# Patient Record
Sex: Female | Born: 1965 | Race: White | Hispanic: No | Marital: Single | Smoking: Former smoker
Health system: Southern US, Community
[De-identification: ages and names within clinical notes are randomized; demographics above are authoritative.]

## PROBLEM LIST (undated history)

## (undated) ENCOUNTER — Ambulatory Visit (HOSPITAL_COMMUNITY): Payer: Medicaid Other

## (undated) DIAGNOSIS — I509 Heart failure, unspecified: Secondary | ICD-10-CM

## (undated) DIAGNOSIS — I219 Acute myocardial infarction, unspecified: Secondary | ICD-10-CM

## (undated) DIAGNOSIS — T7840XA Allergy, unspecified, initial encounter: Secondary | ICD-10-CM

## (undated) DIAGNOSIS — F32A Depression, unspecified: Secondary | ICD-10-CM

## (undated) DIAGNOSIS — T8859XA Other complications of anesthesia, initial encounter: Secondary | ICD-10-CM

## (undated) DIAGNOSIS — L509 Urticaria, unspecified: Secondary | ICD-10-CM

## (undated) DIAGNOSIS — K5010 Crohn's disease of large intestine without complications: Secondary | ICD-10-CM

## (undated) DIAGNOSIS — E785 Hyperlipidemia, unspecified: Secondary | ICD-10-CM

## (undated) HISTORY — PX: OVARIAN CYST SURGERY: SHX726

## (undated) HISTORY — PX: CHOLECYSTECTOMY: SHX55

## (undated) HISTORY — PX: TONSILLECTOMY: SUR1361

## (undated) HISTORY — DX: Hyperlipidemia, unspecified: E78.5

## (undated) HISTORY — DX: Acute myocardial infarction, unspecified: I21.9

## (undated) HISTORY — DX: Depression, unspecified: F32.A

## (undated) HISTORY — PX: CARDIAC CATHETERIZATION: SHX172

## (undated) HISTORY — PX: ABDOMINAL HYSTERECTOMY: SHX81

## (undated) HISTORY — PX: NECK SURGERY: SHX720

## (undated) HISTORY — DX: Urticaria, unspecified: L50.9

## (undated) HISTORY — DX: Allergy, unspecified, initial encounter: T78.40XA

---

## 1997-11-08 ENCOUNTER — Other Ambulatory Visit: Admission: RE | Admit: 1997-11-08 | Discharge: 1997-11-08 | Payer: Self-pay | Admitting: *Deleted

## 1998-02-11 ENCOUNTER — Other Ambulatory Visit: Admission: RE | Admit: 1998-02-11 | Discharge: 1998-02-11 | Payer: Self-pay | Admitting: *Deleted

## 1998-05-05 ENCOUNTER — Ambulatory Visit (HOSPITAL_COMMUNITY): Admission: RE | Admit: 1998-05-05 | Discharge: 1998-05-05 | Payer: Self-pay

## 1998-07-31 ENCOUNTER — Emergency Department (HOSPITAL_COMMUNITY): Admission: EM | Admit: 1998-07-31 | Discharge: 1998-07-31 | Payer: Self-pay | Admitting: Emergency Medicine

## 1998-12-14 ENCOUNTER — Other Ambulatory Visit: Admission: RE | Admit: 1998-12-14 | Discharge: 1998-12-14 | Payer: Self-pay | Admitting: Neurology

## 1999-12-14 ENCOUNTER — Other Ambulatory Visit: Admission: RE | Admit: 1999-12-14 | Discharge: 1999-12-14 | Payer: Self-pay | Admitting: *Deleted

## 2000-12-19 ENCOUNTER — Other Ambulatory Visit: Admission: RE | Admit: 2000-12-19 | Discharge: 2000-12-19 | Payer: Self-pay | Admitting: *Deleted

## 2001-12-16 ENCOUNTER — Other Ambulatory Visit: Admission: RE | Admit: 2001-12-16 | Discharge: 2001-12-16 | Payer: Self-pay | Admitting: *Deleted

## 2002-12-22 ENCOUNTER — Other Ambulatory Visit: Admission: RE | Admit: 2002-12-22 | Discharge: 2002-12-22 | Payer: Self-pay | Admitting: *Deleted

## 2004-02-21 ENCOUNTER — Other Ambulatory Visit: Admission: RE | Admit: 2004-02-21 | Discharge: 2004-02-21 | Payer: Self-pay | Admitting: *Deleted

## 2005-01-17 ENCOUNTER — Ambulatory Visit (HOSPITAL_COMMUNITY): Admission: RE | Admit: 2005-01-17 | Discharge: 2005-01-17 | Payer: Self-pay | Admitting: Internal Medicine

## 2005-02-19 ENCOUNTER — Other Ambulatory Visit: Admission: RE | Admit: 2005-02-19 | Discharge: 2005-02-19 | Payer: Self-pay | Admitting: *Deleted

## 2005-05-22 ENCOUNTER — Ambulatory Visit (HOSPITAL_COMMUNITY): Admission: RE | Admit: 2005-05-22 | Discharge: 2005-05-22 | Payer: Self-pay | Admitting: Orthopaedic Surgery

## 2005-05-28 ENCOUNTER — Encounter (INDEPENDENT_AMBULATORY_CARE_PROVIDER_SITE_OTHER): Payer: Self-pay | Admitting: Specialist

## 2005-05-28 ENCOUNTER — Ambulatory Visit (HOSPITAL_COMMUNITY): Admission: RE | Admit: 2005-05-28 | Discharge: 2005-05-29 | Payer: Self-pay | Admitting: Orthopaedic Surgery

## 2006-03-04 ENCOUNTER — Other Ambulatory Visit: Admission: RE | Admit: 2006-03-04 | Discharge: 2006-03-04 | Payer: Self-pay | Admitting: *Deleted

## 2007-04-22 ENCOUNTER — Other Ambulatory Visit: Admission: RE | Admit: 2007-04-22 | Discharge: 2007-04-22 | Payer: Self-pay | Admitting: *Deleted

## 2007-10-01 ENCOUNTER — Emergency Department (HOSPITAL_COMMUNITY): Admission: EM | Admit: 2007-10-01 | Discharge: 2007-10-02 | Payer: Self-pay | Admitting: Emergency Medicine

## 2008-01-06 ENCOUNTER — Ambulatory Visit (HOSPITAL_COMMUNITY): Admission: RE | Admit: 2008-01-06 | Discharge: 2008-01-06 | Payer: Self-pay | Admitting: General Surgery

## 2008-01-06 ENCOUNTER — Encounter (INDEPENDENT_AMBULATORY_CARE_PROVIDER_SITE_OTHER): Payer: Self-pay | Admitting: General Surgery

## 2008-04-01 ENCOUNTER — Encounter (INDEPENDENT_AMBULATORY_CARE_PROVIDER_SITE_OTHER): Payer: Self-pay | Admitting: Obstetrics and Gynecology

## 2008-04-01 ENCOUNTER — Ambulatory Visit (HOSPITAL_COMMUNITY): Admission: RE | Admit: 2008-04-01 | Discharge: 2008-04-02 | Payer: Self-pay | Admitting: Obstetrics and Gynecology

## 2008-05-24 ENCOUNTER — Encounter: Admission: RE | Admit: 2008-05-24 | Discharge: 2008-05-24 | Payer: Self-pay | Admitting: Obstetrics and Gynecology

## 2010-05-07 ENCOUNTER — Encounter: Payer: Self-pay | Admitting: Obstetrics and Gynecology

## 2010-05-07 ENCOUNTER — Encounter: Payer: Self-pay | Admitting: Orthopaedic Surgery

## 2010-08-29 NOTE — H&P (Signed)
[name]  Christine, [name]             ACCOUNT NO.:  [id number]   MEDICAL RECORD NO.:  [id number]          PATIENT TYPE:  AMB   LOCATION:  SDC                           FACILITY:  [facility]   PHYSICIAN:  Ralene Bathe. Matthew Saras, M.D.DATE OF BIRTH:  02/21/1966   DATE OF ADMISSION:  DATE OF DISCHARGE:                              HISTORY & PHYSICAL   CHIEF COMPLAINT:  Abnormal uterine bleeding.   HISTORY OF PRESENT ILLNESS:  A 45 year old G2, P0 a former patient of  Dr. Warnell Forester.  He had performed 3-4 laparoscopies for ovarian cyst over the  years and additionally had NovaSure AMA in 2008, but has continued to  have problematic bleeding.  Currently, on continuous Ortho-Novum 1/35.  She is a nonsmoker, but due to the continued bleeding she requests  definitive hysterectomy.  We had discussed TLH in detail.  Risks related  to be of bleeding, infection, transfusion, adjacent organ injury, the  possible need for open additional surgery, all reviewed with her, which  she understands and accepts.  Also, other risks related to phlebitis and  her expected recovery time discussed.   PAST MEDICAL HISTORY:   ALLERGIES:  LEVAQUIN, SULFA, ERYTHROMYCIN, ULTRAM, ADVIL NAPROSYN, SOME  ANTIDEPRESSANTS.   CURRENT MEDICATIONS:  Effexor, trazodone, Ambien, Ortho-Novum 1/35.   SURGICAL HISTORY:  Tonsillectomy in 1985, hernia repair in 1993.  She  has had 4-5 laparoscopic exams for ovarian cyst.  Uterine ablation in  2008, cholecystectomy in 2009, and neck surgery in 2008.   FAMILY HISTORY:  Significant for father with heart disease and diabetes.   SOCIAL HISTORY:  She does smoke 1 PPD, no alcohol use.   PHYSICAL EXAMINATION:  VITAL SIGNS:  Temp 98.2, blood pressure 120/78.  HEENT:  Unremarkable.  NECK:  Supple without masses.  LUNGS:  Clear.  CARDIOVASCULAR:  Regular rate and rhythm without murmurs, rubs, or  gallops.  BREASTS:  Without masses.  ABDOMEN:  Soft, flat, nontender.  PELVIC:  Normal external  genitalia.  Vagina and cervix clear.  Uterus  mid positional in size.  Adnexa negative.  EXTREMITIES AND NEUROLOGIC:  Unremarkable.   IMPRESSION:  Abnormal bleeding status post NovaSure EMA.   PLAN:  TLH procedure and risks reviewed as above.      Richard M. Matthew Saras, M.D.  Electronically Signed     RMH/MEDQ  D:  03/29/2008  T:  03/30/2008  Job:  [id number]

## 2010-08-29 NOTE — Discharge Summary (Signed)
NAMEMarland Kitchen  [name], Christine             ACCOUNT NO.:  [id number]   MEDICAL RECORD NO.:  [id number]          PATIENT TYPE:  OIB   LOCATION:  9311                          FACILITY:  [facility]   PHYSICIAN:  Ralene Bathe. Matthew Saras, M.D.DATE OF BIRTH:  February 05, 1966   DATE OF ADMISSION:  04/01/2008  DATE OF DISCHARGE:                               DISCHARGE SUMMARY   DISCHARGE DIAGNOSES:  1. 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  2. Total laparoscopic hysterectomy this admission.   Summary of the history and physical exam,  please see admission H and P  for details.  Briefly a 45 year old G57, P0 who has had a prior  endometrial ablation by Dr. Serafina Royals, continues to have problematic  bleeding, presents now for definitive TLH.   HOSPITAL COURSE:  On 04/01/2008 under general anesthesia, the patient  underwent TLH with 100 mL EBL.  The following  a.m., her catheter was  removed.  Diet was advance.  She was afebrile.  Her abdominal exam was  unremarkable.  She was ready for discharge at that point.   LABORATORY DATA:  CMET on admission normal except for glucose 107, O2  73, PT 138.  CBC preop, hemoglobin 14.0 and platelet count 289,000.  Blood type is A positive.  Antibody screen negative.  UPT negative.  Postop CBC on 04/02/2008 WBCs 7.3, hemoglobin 11.8, and hematocrit 34.1.   DISPOSITION:  The patient was discharged on Tylox p.r.n.,  and will  return to our office  in 7-10 days.  Advised to report any incisional  redness or drainage, increased pain or bleeding or fever of 101.  She  was given specific instructions regarding diet, sex, and exercise.   CONDITION:  Good.   ACTIVITY:  Good.      Richard M. Matthew Saras, M.D.  Electronically Signed     RMH/MEDQ  D:  04/02/2008  T:  04/02/2008  Job:  [id number]

## 2010-08-29 NOTE — Op Note (Signed)
Christine [name], Christine [name]             ACCOUNT NO.:  [id number]   MEDICAL RECORD NO.:  [id number]          PATIENT TYPE:  AMB   LOCATION:  DAY                          FACILITY:  [facility]   PHYSICIAN:  Edsel Petrin. Dalbert Batman, M.D.DATE OF BIRTH:  06-28-1965   DATE OF PROCEDURE:  01/06/2008  DATE OF DISCHARGE:                               OPERATIVE REPORT   PREOPERATIVE DIAGNOSIS:  Chronic cholecystitis with cholelithiasis.   POSTOPERATIVE DIAGNOSIS:  Chronic cholecystitis with cholelithiasis.   OPERATION PERFORMED:  Laparoscopic cholecystectomy with intraoperative  cholangiogram.   SURGEON:  Edsel Petrin. Dalbert Batman, M.D.   FIRST ASSISTANT:  Odis Hollingshead, M.D.   OPERATIVE INDICATIONS:  This is a 45 year old white female who has a 3-  month history of intermittent episodes of epigastric and right upper  quadrant pain, radiating to the back with associated nausea and  occasional vomiting.  She had a gallbladder ultrasound which showed  gallstones, but was otherwise unremarkable.  She had been evaluated as  an outpatient and scheduled for elective cholecystectomy.  She continues  to have mild attacks every 2 or 3 days.  Liver function tests are  normal.   OPERATIVE TECHNIQUE:  Following the induction of general endotracheal  anesthesia, the patient's abdomen was prepped and draped in a sterile  fashion.  Intravenous antibiotics were given.  The patient was  identified as the correct patient and correct procedure.  Marcaine 0.5%  with epinephrine was used as a local infiltration anesthetic.  A  transverse incision was made at the lower rim of the umbilicus, through  a previous laparoscopy scar.  The fascia was incised in the midline and  the abdominal cavity entered under direct vision.  A 10-mm Hassan trocar  was inserted and secured with a pursestring suture of 0 Vicryl.  Pneumoperitoneum was created.  The video cam was inserted, with  visualization and findings as described above.  An 10-mm  trocar was  placed in the subxiphoid region, and two 5-mm trocars were placed in the  right upper quadrant.  The gallbladder fundus was identified and  elevated.  There were a few adhesions in the lower gallbladder, but not  much.  The infundibulum was retracted laterally.  We dissected the  peritoneum off of the lower aspect of the gallbladder.  We dissected out  the cystic duct and the cystic artery.  We created a nice window behind  the cystic duct.  A cholangiogram catheter was inserted into the cystic  duct.  Cholangiogram was obtained with the C-arm.   The cholangiogram showed normal intrahepatic and extrahepatic bile  ducts, no filling defect, and no obstruction with good flow of contrast  into the duodenum.  The cholangiogram catheter was removed.  The cystic  duct was secured with 3 metal clips and divided.  I dissected out 2  small branches of the cystic artery, one going anteriorly and one going  posteriorly.  These were isolated separately as they went onto the wall  of the gallbladder, and were then clipped and divided.  Gallbladder was  dissected from its bed with electrocautery, placed in a specimen bag  and  removed.  The operative field was copiously irrigated with saline.  A  few small bleeders in the bed of the gallbladder were controlled with  electrocautery.  At the completion of the case there was no bleeding and  no bile leak whatsoever.   We looked around and thought that the liver, stomach, duodenum, small  intestine and large intestine were grossly normal to inspection.  No  other abnormalities were seen.   The pneumoperitoneum was released.  The trocars were removed.  There was  no bleeding from the trocar sites.  The fascia at the umbilicus was  closed with 0 Vicryl sutures, and the skin closed with subcuticular  sutures of 4-0 Monocryl and Dermabond.  Clean bandages were placed.  The  patient was taken to the recovery room in stable condition.   ESTIMATED  BLOOD LOSS:  About 10 mL.   COMPLICATIONS:  None.   Sponge, needle and instrument counts were correct.      Edsel Petrin. Dalbert Batman, M.D.  Electronically Signed     HMI/MEDQ  D:  01/06/2008  T:  01/06/2008  Job:  [id number]   cc:   Darcus Austin, D.O.  Fax: Chambers. Irven Baltimore, M.D.  Fax: [phone]   Chucky May, M.D.  Fax: [phone]

## 2010-08-29 NOTE — Op Note (Signed)
NAMEMarland Kitchen  RAIA, AMICO             ACCOUNT NO.:  [id number]   MEDICAL RECORD NO.:  [id number]          PATIENT TYPE:  OIB   LOCATION:  9311                          FACILITY:  [facility]   PHYSICIAN:  Ralene Bathe. Matthew Saras, M.D.DATE OF BIRTH:  04-11-1966   DATE OF PROCEDURE:  DATE OF DISCHARGE:                               OPERATIVE REPORT   PREOPERATIVE DIAGNOSES:  Persistent menorrhagia, possible adenomyosis.   POSTOPERATIVE DIAGNOSES:  Persistent menorrhagia, possible adenomyosis.   PROCEDURE:  TLH.   SURGEON:  Ralene Bathe. Matthew Saras, MD   ASSISTANT:  Darlyn Chamber, MD   ANESTHESIA:  General endotracheal.   COMPLICATIONS:  None.   DRAINS:  Foley catheter.   BLOOD LOSS:  100 mL.   SPECIMENS:  Uterus sent to pathology.   PROCEDURE AND FINDINGS:  The patient was taken to the  preoperative room  after an adequate level of general endotracheal anesthesia was obtained,  the patient's legs in stirrups.  The abdomen, perineum, and vagina were  prepped and draped in usual manner for laparoscopy.  The bladder was  drained with the Foley catheter.  The subumbilical area was infiltrated  with 0.25% Marcaine plain.  A small incision was made.  The Veress  needle was introduced without difficulty at its intra-abdominal position  verified by pressure and water testing.  After 2-1/2 liter  pneumoperitoneum was then created, laparoscopic trocar and sleeve were  then introduced without difficulty.  Initial inspection revealed no  evidence of any bleeding or trauma.  This was a 5-mm trocar with a 5-mm  laparoscopic camera.  The patient placed in Trendelenburg.  The left  lateral 10:11 was placed after negative transillumination and local  anesthetic, the same on the right for three-port technique.  She was  placed in Trendelenburg and grasper was used to grasp the fundus.  Inspection revealed both ovaries to be normal.  The anterior cul-de-sac  were free and clear.  No other abnormalities were  noted.  Using the  EnSeal device, the utero-ovarian pedicle on the left was coagulated and  divided down including the round ligament.  The peritoneum was then  carried around anteriorly creating a bladder flap.  Minimal  skeletonization of the ascending branch of the uterine artery, then the  EnSeal was used to coagulate and divide the uterine.  The exact same  repeated on the opposite side conserving both ovaries.  Further  dissection of the bladder with strong upper traction by the assistance  with blunt dissection.  At that point, a vaginal sponge stick was used  to place firm upper pressure on the anterior fornix, this could be  palpated and some additional tissue was minimally, sharply, and bluntly  dissected below to allow the bladder to be well below.  The anterior  vaginal mucosa was then palpated, was back scored with the harmonic Ace  to perform anterior colpotomy with visualization of the sponge stick.  A  sponge stick vaginal was placed in the posterior fornix on tension with  upper traction and a posterior colpotomy was performed similarly with  the harmonic ACE.  Then, using the harmonic  ACE, the cardinal ligament,  uterosacral ligaments, and the remaining cervicovaginal pedicles were  coagulated and divided.  The specimen was removed transvaginally, left  in the vagina to occlude the pneumoperitoneum.  The pelvis was then  irrigated with saline and aspirated.  A 0 Vicryl sutures were then used  with extracorporeal knot trying to reapproximate good tissue bites from  the vaginal mucosa, 6 sutures across the vaginal cuff.  Further  irrigation revealed excellent hemostasis even at reduced pressure.  Interceed was placed across the vaginal cuff, instruments removed.  Gas  allowed to escape.  Defects closed with 4-0 Dexon subcuticular sutures  and Dermabond.  She tolerated this well, went to recovery room in good  condition.      Richard M. Matthew Saras, M.D.  Electronically  Signed     RMH/MEDQ  D:  04/01/2008  T:  04/01/2008  Job:  [id number]

## 2010-09-01 NOTE — Op Note (Signed)
Christine [name], Christine [name]             ACCOUNT NO.:  [id number]   MEDICAL RECORD NO.:  [id number]          PATIENT TYPE:  OIB   LOCATION:  5007                         FACILITY:  [facility]   PHYSICIAN:  Mark C. Lorin Mercy, M.D.    DATE OF BIRTH:  1965/06/16   DATE OF PROCEDURE:  05/28/2005  DATE OF DISCHARGE:                                 OPERATIVE REPORT   PREOPERATIVE DIAGNOSES:  1.  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  2.  Right recurrent dorsal hand ganglion.   POSTOPERATIVE DIAGNOSES:  1.  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  2.  Right recurrent dorsal hand ganglion.   PROCEDURE:  1.  C6-7 anterior cervical diskectomy and fusion with left iliac crest bone      graft.  2.  Right hand dorsal ganglion excision.   SURGEON:  Mark C. Lorin Mercy, M.D.   BRIEF HISTORY:  This 45 year old female has had cervical spondylosis for  several years and told she needed an anterior cervical fusion.  Her neck got  better 6-9 months later with continued mild pain.  She has only had  significant increase in pain with left arm triceps weakness and MRI showing  C6-7 HNP on the left.  She has also had a right dorsal hand ganglion that  she has had aspirated before; it has recurred and it is 2 x 3 cm and  painful.   DESCRIPTION OF PROCEDURE:  After induction of general anesthesia, arm was  tucked to the side with careful __________  pads, 2-pound sandbag behind the  neck, gel bag behind the left buttock.  Head in halter traction, neck and  iliac crest were prepped with  DuraPrep, area scored with towels, sterile  skin marker on the neck, Betadine, Vi-Drape and a sterile mask at the head,  thyroid sheets and drapes.  Iliac crest was scored with towels and had a Vi-  Drape applied as well.  Incision was started in the neck, starting in the  midline and extending to the left, blunt dissection with carotid sheath and  contents lateral.  Esophagus was medial and in the midline, longus colli  were split.  There  was an osteophyte present and C6 disk space was  identified by the carotid tubercle, which was directly underneath the  incision.  A 25 short needle was used for identification.  Cross-table  lateral x-ray was taken, which confirmed the appropriate level.  Disk was  marked by removing the 25-gauge needle and making a box cut with the scalpel  and using a pituitary to remove some of the disk.  After placement of teeth  Cloward retractors, left and right smooth blades up and down, the patient  was then prepared for the Cloward by removing remaining disk with pituitary  and Cloward curettes.  Operating microscope was brought in after Cloward was  used to drill a 12-mm hole back to the posterior cortex.  With the draped  microscope present, the posterior longitudinal ligament was taken down and  bone was removed from 1/2 of the key hole with uncovertebral joints prepared  with stripping right and left.  There was scarring of the posterior  longitudinal ligament over on the left side with a pocket and the posterior  longitudinal ligament was taken with exposure of the dura, but there were no  extruded fragments.  Microdissection and palpation with a black nerve hook  used as a hockey-stick was performed.  No extruded fragments were present.  Bone was removed, enlarging each foramen and a 14-mm plug was then placed  after a depth gauge measurement, bullet-nosing it and generally sinking it 1  mm.  The neck was rotated.  The 14-mm plug was sturdy.  The iliac crest had  been exposed with a 2-cm skin incision, splitting the deep fascia in line  with the fibers, cleaning the muscle off with a Cobb and then using the 14-  mm Cloward bone harvester.  Sponge was packed in the iliac crest due to some  mild bleeding.  Neck was closed with 3-0 in the platysma and 4-0  subcuticular skin closure, tincture of Benzoin and Steri-Strips.  A Hemovac  was placed prior to closure in line with the skin incision on  the left  lateral aspect of the incision.  Iliac crest was closed with 0 Vicryl in the  fascia, 2-0 in the subcutaneous tissue, 4-0 Vicryl subcuticular skin  closure, tincture of Benzoin, Steri-Strips and postop dressing.  A soft  cervical collar was applied after dressing on the neck.   Arm was taken out of the tucked position and placed on a hand table, prepped  up to the elbow with a proximal arm tourniquet.  Extremity sheet and drape  were applied.  The patient was then prepared with an extremity sheet,  sterile skin marker and tourniquet inflation.  A transverse incision was  made with blunt dissection down to the ganglion that was present and it was  coming off from the dorsal wrist, coming underneath the index extensors and  was a very large ganglion.  The stalk was cut off and then the stalk was  followed down to the wrist joint, where it was opened and a piece of the  dorsal capsule was excised where the ganglion neck began.  After irrigation  with saline solution, tourniquet was deflated with total tourniquet time of  5 minutes.  Hemostasis was obtained.  With using the Bovie on one of the  bleeders at the capsule, the skin edge proximally touched the Bovie and a 1-  mm ellipse of skin was taken back to normal skin and then reapproximated  with 3-0 Vicryl and 4-0 Vicryl subcuticular skin closure.  Tincture of  Benzoin, Steri-Strips, 4 x 4 and Coban were applied, and the patient was  transferred to Recovery in stable condition.  Instrument count and needle  count were correct.      Mark C. Lorin Mercy, M.D.  Electronically Signed     MCY/MEDQ  D:  05/28/2005  T:  05/29/2005  Job:  [id number]

## 2011-01-11 LAB — URINALYSIS, ROUTINE W REFLEX MICROSCOPIC
Bilirubin Urine: NEGATIVE
Glucose, UA: NEGATIVE
Hgb urine dipstick: NEGATIVE
Ketones, ur: NEGATIVE
Nitrite: NEGATIVE
Protein, ur: NEGATIVE
Specific Gravity, Urine: 1.018
Urobilinogen, UA: 0.2
pH: 5.5

## 2011-01-11 LAB — DIFFERENTIAL
Basophils Absolute: 0.1
Basophils Relative: 1
Eosinophils Absolute: 0.2
Eosinophils Relative: 2
Lymphocytes Relative: 28
Lymphs Abs: 2.8
Monocytes Absolute: 0.6
Monocytes Relative: 6
Neutro Abs: 6.4
Neutrophils Relative %: 64

## 2011-01-11 LAB — COMPREHENSIVE METABOLIC PANEL
ALT: 17
AST: 17
Albumin: 3.5
Alkaline Phosphatase: 55
BUN: 5 — ABNORMAL LOW
CO2: 27
Calcium: 8.5
Chloride: 103
Creatinine, Ser: 0.72
GFR calc Af Amer: >60
GFR calc non Af Amer: >60
Glucose, Bld: 95
Potassium: 3.2 — ABNORMAL LOW
Sodium: 136
Total Bilirubin: 0.6
Total Protein: 5.8 — ABNORMAL LOW

## 2011-01-11 LAB — CBC
HCT: 40.8
Hemoglobin: 13.7
MCHC: 33.6
MCV: 92.5
Platelets: 320
RBC: 4.41
RDW: 12.7
WBC: 10

## 2011-01-11 LAB — URINE MICROSCOPIC-ADD ON

## 2011-01-11 LAB — POCT PREGNANCY, URINE
Operator id: 29011
Preg Test, Ur: NEGATIVE

## 2011-01-11 LAB — LIPASE, BLOOD: Lipase: 29

## 2011-01-15 LAB — COMPREHENSIVE METABOLIC PANEL
ALT: 20
AST: 19
Albumin: 3.8
Alkaline Phosphatase: 66
BUN: 9
CO2: 22
Calcium: 8.7
Chloride: 109
Creatinine, Ser: 0.95
GFR calc Af Amer: >60
GFR calc non Af Amer: >60
Glucose, Bld: 107 — ABNORMAL HIGH
Potassium: 3.2 — ABNORMAL LOW
Sodium: 139
Total Bilirubin: 0.5
Total Protein: 6.4

## 2011-01-15 LAB — CBC
HCT: 42
Hemoglobin: 13.9
MCHC: 33.1
MCV: 93.8
Platelets: 319
RBC: 4.48
RDW: 13
WBC: 9.2

## 2011-01-15 LAB — DIFFERENTIAL
Basophils Absolute: 0
Basophils Relative: 1
Eosinophils Absolute: 0.2
Eosinophils Relative: 3
Lymphocytes Relative: 17
Lymphs Abs: 1.6
Monocytes Absolute: 0.5
Monocytes Relative: 5
Neutro Abs: 6.8
Neutrophils Relative %: 74

## 2011-01-15 LAB — URINALYSIS, ROUTINE W REFLEX MICROSCOPIC
Bilirubin Urine: NEGATIVE
Glucose, UA: NEGATIVE
Hgb urine dipstick: NEGATIVE
Ketones, ur: NEGATIVE
Nitrite: NEGATIVE
Protein, ur: NEGATIVE
Specific Gravity, Urine: 1.027
Urobilinogen, UA: 0.2
pH: 6

## 2011-01-15 LAB — PREGNANCY, URINE: Preg Test, Ur: NEGATIVE

## 2011-01-19 LAB — CBC
HCT: 34.1 % — ABNORMAL LOW (ref 36.0–46.0)
HCT: 41.2 % (ref 36.0–46.0)
Hemoglobin: 14 g/dL (ref 12.0–15.0)
MCHC: 34 g/dL (ref 30.0–36.0)
MCV: 93.4 fL (ref 78.0–100.0)
Platelets: 229 10*3/uL (ref 150–400)
Platelets: 289 10*3/uL (ref 150–400)
RBC: 4.42 MIL/uL (ref 3.87–5.11)
RDW: 12.5 % (ref 11.5–15.5)
WBC: 7.3 10*3/uL (ref 4.0–10.5)
WBC: 9.1 10*3/uL (ref 4.0–10.5)

## 2011-01-19 LAB — COMPREHENSIVE METABOLIC PANEL
ALT: 138 U/L — ABNORMAL HIGH (ref 0–35)
AST: 73 U/L — ABNORMAL HIGH (ref 0–37)
Albumin: 3.6 g/dL (ref 3.5–5.2)
Alkaline Phosphatase: 76 U/L (ref 39–117)
BUN: 7 mg/dL (ref 6–23)
CO2: 26 mEq/L (ref 19–32)
Calcium: 8.5 mg/dL (ref 8.4–10.5)
Chloride: 106 mEq/L (ref 96–112)
Creatinine, Ser: 0.81 mg/dL (ref 0.4–1.2)
GFR calc Af Amer: >60 mL/min (ref >60)
GFR calc non Af Amer: >60 mL/min (ref >60)
Glucose, Bld: 107 mg/dL — ABNORMAL HIGH (ref 70–99)
Potassium: 3.5 mEq/L (ref 3.5–5.1)
Sodium: 137 mEq/L (ref 135–145)
Total Bilirubin: 0.4 mg/dL (ref 0.3–1.2)
Total Protein: 6 g/dL (ref 6.0–8.3)

## 2011-01-19 LAB — TYPE AND SCREEN
ABO/RH(D): A POS
Antibody Screen: NEGATIVE

## 2011-01-19 LAB — ABO/RH: ABO/RH(D): A POS

## 2011-01-19 LAB — HCG, SERUM, QUALITATIVE: Preg, Serum: NEGATIVE

## 2019-09-10 ENCOUNTER — Encounter: Payer: Self-pay | Admitting: Internal Medicine

## 2019-09-10 NOTE — Telephone Encounter (Signed)
-   call received during Epic downtime  Patient called to advise that she has put on a new DEXCOM patch and that the blood sugar readings from there ar 186 and that she used a finger stick/meter and her blood sugar was 126.  Wants to know why there would be such a large discrepancy in readings?  Also wanted to advise that blood sugars seem ti be under less control since changing from Victoza to Trulicity.    Patient requested a call back to [phone]

## 2019-09-11 NOTE — Telephone Encounter (Signed)
Please advise 

## 2019-09-11 NOTE — Telephone Encounter (Signed)
ERRONEOUS ENCOUNTER

## 2019-09-11 NOTE — Telephone Encounter (Signed)
Please also advise on victoza/trulicity?

## 2020-04-02 ENCOUNTER — Ambulatory Visit: Payer: Self-pay | Attending: Internal Medicine

## 2020-04-02 DIAGNOSIS — Z23 Encounter for immunization: Secondary | ICD-10-CM

## 2020-04-02 NOTE — Progress Notes (Signed)
   Covid-19 Vaccination Clinic  Name:  Christine [name]    MRN: [id number] DOB: 1965/06/06  04/02/2020  [name] was observed post Covid-19 immunization for 15 minutes without incident. She was provided with Vaccine Information Sheet and instruction to access the V-Safe system.   [name] was instructed to call 911 with any severe reactions post vaccine: Marland Kitchen Difficulty breathing  . Swelling of face and throat  . A fast heartbeat  . A bad rash all over body  . Dizziness and weakness   Immunizations Administered    Name Date Dose VIS Date Route   Pfizer COVID-19 Vaccine 04/02/2020 10:07 AM 0.3 mL 02/03/2020 Intramuscular   Manufacturer: [place]   Lot: [id number]   [place]: [id number]

## 2020-09-01 ENCOUNTER — Ambulatory Visit (HOSPITAL_COMMUNITY)
Admission: EM | Admit: 2020-09-01 | Discharge: 2020-09-01 | Disposition: A | Discharge status: Home / Self Care | Payer: 59

## 2020-09-01 ENCOUNTER — Encounter (HOSPITAL_COMMUNITY): Payer: Self-pay | Admitting: Emergency Medicine

## 2020-09-01 ENCOUNTER — Other Ambulatory Visit: Payer: Self-pay

## 2020-09-01 DIAGNOSIS — M62838 Other muscle spasm: Secondary | ICD-10-CM | POA: Diagnosis not present

## 2020-09-01 LAB — POCT URINALYSIS DIPSTICK, ED / UC
Bilirubin Urine: NEGATIVE
Glucose, UA: NEGATIVE mg/dL
Hgb urine dipstick: NEGATIVE
Ketones, ur: NEGATIVE mg/dL
Leukocytes,Ua: NEGATIVE
Nitrite: NEGATIVE
Protein, ur: NEGATIVE mg/dL
Specific Gravity, Urine: 1.015 (ref 1.005–1.030)
Urobilinogen, UA: 0.2 mg/dL (ref 0.0–1.0)
pH: 6 (ref 5.0–8.0)

## 2020-09-01 MED ORDER — TIZANIDINE HCL 4 MG PO TABS: 4.0000 mg | ORAL_TABLET | Freq: Four times a day (QID) | ORAL | 0 refills | Status: DC | PRN

## 2020-09-01 NOTE — Discharge Instructions (Signed)
Take the Zanaflex as needed for muscle pain and spasms.  You can continue to take Tylenol as needed for pain.   Use heat, ice, or alternate heat and ice for comfort.  Rest as much as possible for the next few days.   If your symptoms worsen, go to the emergency department for further evaluation.

## 2020-09-01 NOTE — ED Triage Notes (Addendum)
Fullness in epigastric area started Saturday after a full meal.  Also had soreness in upper back.  On Monday, started having left side pain, uncomfortable feeling.  Yesterday, when bending forward, left side pain gets worse and is moving further down side.  Urinating more frequently, but no other urinary symptoms.  Deep breathing makes pain in left side worse.  Random chills

## 2020-09-01 NOTE — ED Provider Notes (Signed)
[place]    CSN: [id number] Arrival date & time: 09/01/20  1409      History   Chief Complaint No chief complaint on file.   HPI Christine [name] is a 55 y.o. female.   Patient here for evaluation of left side flank and back pain that has been ongoing for the past several days.  Reports urinary frequency but denies any urgency or dysuria.  Has been taking Tylenol with minimal relief of symptoms.  Reports pain worse with movement and when taking a deep breath.  Denies any trauma, injury, or other precipitating event.  Denies any specific alleviating or aggravating factors.  Denies any fevers, chest pain, shortness of breath, N/V/D, numbness, tingling, weakness, abdominal pain, or headaches.   ROS: As per HPI, all other pertinent ROS negative   The history is provided by the patient.    History reviewed. No pertinent past medical history.  There are no problems to display for this patient.   Past Surgical History:  Procedure Laterality Date  . ABDOMINAL HYSTERECTOMY    . CHOLECYSTECTOMY    . NECK SURGERY    . OVARIAN CYST SURGERY      OB History   No obstetric history on file.      Home Medications    Prior to Admission medications   Medication Sig Start Date End Date Taking? Authorizing Provider  ALPRAZolam (XANAX) 1 MG tablet TAKE 1-2 TABLETS BY MOUTH 4 TIMES DAILY. 12/10/17  Yes [provider]  tiZANidine (ZANAFLEX) 4 MG tablet Take 1 tablet (4 mg total) by mouth every 6 (six) hours as needed for muscle spasms. 09/01/20  Yes Pearson Forster, NP  amphetamine-dextroamphetamine (ADDERALL) 30 MG tablet Take 1 tablet by mouth 2 (two) times daily. 07/29/20   [provider]  cyclobenzaprine (FLEXERIL) 10 MG tablet Take 2 tablets by mouth at bedtime. 08/05/20   [provider]  venlafaxine (EFFEXOR) 75 MG tablet Take by mouth.    [provider]  zolpidem (AMBIEN) 5 MG tablet Take by mouth.    [provider]     Family History History reviewed. No pertinent family history.  Social History Social History   Tobacco Use  . Smoking status: Current Every Day Smoker  Vaping Use  . Vaping Use: Never used  Substance Use Topics  . Alcohol use: Never  . Drug use: Never     Allergies   Sulfa antibiotics, Erythromycin, Sulfamethoxazole-trimethoprim, and Tramadol   Review of Systems Review of Systems  Genitourinary: Positive for flank pain and frequency. Negative for hematuria and urgency.  Musculoskeletal: Positive for back pain.  All other systems reviewed and are negative.    Physical Exam Triage Vital Signs ED Triage Vitals  Enc Vitals Group     BP 09/01/20 1530 111/77     Pulse Rate 09/01/20 1530 (!) 101     Resp 09/01/20 1530 20     Temp 09/01/20 1530 98.1 F (36.7 C)     Temp Source 09/01/20 1530 Oral     SpO2 09/01/20 1530 97 %     Weight --      Height --      Head Circumference --      Peak Flow --      Pain Score 09/01/20 1525 6     Pain Loc --      Pain Edu? --      Excl. in [place]? --    No data found.  Updated Vital  Signs BP 111/77 (BP Location: Left Arm)   Pulse (!) 101   Temp 98.1 F (36.7 C) (Oral)   Resp 20   SpO2 97%   Visual Acuity Right Eye Distance:   Left Eye Distance:   Bilateral Distance:    Right Eye Near:   Left Eye Near:    Bilateral Near:     Physical Exam Vitals and nursing note reviewed.  Constitutional:      General: She is not in acute distress.    Appearance: Normal appearance. She is not ill-appearing, toxic-appearing or diaphoretic.  HENT:     Head: Normocephalic and atraumatic.  Eyes:     Conjunctiva/sclera: Conjunctivae normal.  Cardiovascular:     Rate and Rhythm: Normal rate.     Pulses: Normal pulses.  Pulmonary:     Effort: Pulmonary effort is normal.  Abdominal:     General: Abdomen is flat.     Palpations: Abdomen is soft.     Tenderness: There is no abdominal tenderness. There is no right CVA tenderness,  left CVA tenderness, guarding or rebound. Negative signs include Murphy's sign, Rovsing's sign, McBurney's sign, psoas sign and obturator sign.  Musculoskeletal:        General: Normal range of motion.     Cervical back: Normal range of motion.     Lumbar back: Spasms and tenderness (left side and flank tenderness) present. No bony tenderness. Normal range of motion. Negative right straight leg raise test and negative left straight leg raise test.  Skin:    General: Skin is warm and dry.  Neurological:     General: No focal deficit present.     Mental Status: She is alert and oriented to person, place, and time.  Psychiatric:        Mood and Affect: Mood normal.      UC Treatments / Results  Labs (all labs ordered are listed, but only abnormal results are displayed) Labs Reviewed  POCT URINALYSIS DIPSTICK, ED / UC    EKG   Radiology No results found.  Procedures Procedures (including critical care time)  Medications Ordered in UC Medications - No data to display  Initial Impression / Assessment and Plan / UC Course  I have reviewed the triage vital signs and the nursing notes.  Pertinent labs & imaging results that were available during my care of the patient were reviewed by me and considered in my medical decision making (see chart for details).    Assessment negative for red flags or concerns.  Urinalysis negative, no signs of infection.  This is likely muscle spasms or muscle strain.  Prescribed Zanaflex as needed for muscle pain and spasms.  May continue to take Tylenol as needed for pain.  Encouraged heat, ice or alternating heat and ice.  Encourage patient to follow-up if symptoms worsen or do not improve in the next few days. Final Clinical Impressions(s) / UC Diagnoses   Final diagnoses:  Muscle spasm     Discharge Instructions     Take the Zanaflex as needed for muscle pain and spasms.  You can continue to take Tylenol as needed for pain.   Use heat,  ice, or alternate heat and ice for comfort.  Rest as much as possible for the next few days.   If your symptoms worsen, go to the emergency department for further evaluation.        ED Prescriptions    Medication Sig Dispense Auth. Provider   tiZANidine (ZANAFLEX) 4 MG  tablet Take 1 tablet (4 mg total) by mouth every 6 (six) hours as needed for muscle spasms. 30 tablet Pearson Forster, NP     PDMP not reviewed this encounter.   Pearson Forster, NP 09/01/20 [phone]

## 2021-01-20 ENCOUNTER — Emergency Department (HOSPITAL_BASED_OUTPATIENT_CLINIC_OR_DEPARTMENT_OTHER): Payer: Medicaid Other | Admitting: Radiology

## 2021-01-20 ENCOUNTER — Emergency Department (HOSPITAL_BASED_OUTPATIENT_CLINIC_OR_DEPARTMENT_OTHER): Payer: Medicaid Other

## 2021-01-20 ENCOUNTER — Inpatient Hospital Stay (HOSPITAL_BASED_OUTPATIENT_CLINIC_OR_DEPARTMENT_OTHER)
Admission: EM | Admit: 2021-01-20 | Discharge: 2021-01-25 | DRG: 287 | Disposition: A | Discharge status: Home / Self Care | Payer: Medicaid Other | Attending: Internal Medicine | Admitting: Internal Medicine

## 2021-01-20 ENCOUNTER — Encounter (HOSPITAL_BASED_OUTPATIENT_CLINIC_OR_DEPARTMENT_OTHER): Payer: Self-pay | Admitting: *Deleted

## 2021-01-20 ENCOUNTER — Other Ambulatory Visit: Payer: Self-pay

## 2021-01-20 DIAGNOSIS — Z79899 Other long term (current) drug therapy: Secondary | ICD-10-CM

## 2021-01-20 DIAGNOSIS — F17210 Nicotine dependence, cigarettes, uncomplicated: Secondary | ICD-10-CM | POA: Diagnosis present

## 2021-01-20 DIAGNOSIS — D72829 Elevated white blood cell count, unspecified: Secondary | ICD-10-CM | POA: Diagnosis not present

## 2021-01-20 DIAGNOSIS — I2510 Atherosclerotic heart disease of native coronary artery without angina pectoris: Secondary | ICD-10-CM | POA: Diagnosis present

## 2021-01-20 DIAGNOSIS — Z20822 Contact with and (suspected) exposure to covid-19: Secondary | ICD-10-CM | POA: Diagnosis present

## 2021-01-20 DIAGNOSIS — F419 Anxiety disorder, unspecified: Secondary | ICD-10-CM

## 2021-01-20 DIAGNOSIS — I255 Ischemic cardiomyopathy: Secondary | ICD-10-CM | POA: Diagnosis present

## 2021-01-20 DIAGNOSIS — M546 Pain in thoracic spine: Secondary | ICD-10-CM | POA: Diagnosis not present

## 2021-01-20 DIAGNOSIS — Z882 Allergy status to sulfonamides status: Secondary | ICD-10-CM

## 2021-01-20 DIAGNOSIS — Z597 Insufficient social insurance and welfare support: Secondary | ICD-10-CM

## 2021-01-20 DIAGNOSIS — J9811 Atelectasis: Secondary | ICD-10-CM | POA: Diagnosis not present

## 2021-01-20 DIAGNOSIS — I5021 Acute systolic (congestive) heart failure: Principal | ICD-10-CM | POA: Diagnosis present

## 2021-01-20 DIAGNOSIS — Z823 Family history of stroke: Secondary | ICD-10-CM

## 2021-01-20 DIAGNOSIS — Z9049 Acquired absence of other specified parts of digestive tract: Secondary | ICD-10-CM | POA: Diagnosis not present

## 2021-01-20 DIAGNOSIS — R339 Retention of urine, unspecified: Secondary | ICD-10-CM | POA: Diagnosis not present

## 2021-01-20 DIAGNOSIS — I2582 Chronic total occlusion of coronary artery: Secondary | ICD-10-CM | POA: Diagnosis not present

## 2021-01-20 DIAGNOSIS — I5020 Unspecified systolic (congestive) heart failure: Secondary | ICD-10-CM

## 2021-01-20 DIAGNOSIS — I959 Hypotension, unspecified: Secondary | ICD-10-CM | POA: Diagnosis not present

## 2021-01-20 DIAGNOSIS — Z720 Tobacco use: Secondary | ICD-10-CM | POA: Diagnosis not present

## 2021-01-20 DIAGNOSIS — R918 Other nonspecific abnormal finding of lung field: Secondary | ICD-10-CM | POA: Diagnosis not present

## 2021-01-20 DIAGNOSIS — Z8249 Family history of ischemic heart disease and other diseases of the circulatory system: Secondary | ICD-10-CM

## 2021-01-20 DIAGNOSIS — I340 Nonrheumatic mitral (valve) insufficiency: Secondary | ICD-10-CM | POA: Diagnosis not present

## 2021-01-20 DIAGNOSIS — F32A Depression, unspecified: Secondary | ICD-10-CM | POA: Diagnosis present

## 2021-01-20 DIAGNOSIS — Z881 Allergy status to other antibiotic agents status: Secondary | ICD-10-CM

## 2021-01-20 DIAGNOSIS — Z8616 Personal history of COVID-19: Secondary | ICD-10-CM

## 2021-01-20 DIAGNOSIS — R0602 Shortness of breath: Secondary | ICD-10-CM | POA: Diagnosis not present

## 2021-01-20 DIAGNOSIS — R0789 Other chest pain: Secondary | ICD-10-CM | POA: Diagnosis not present

## 2021-01-20 DIAGNOSIS — I509 Heart failure, unspecified: Secondary | ICD-10-CM | POA: Diagnosis not present

## 2021-01-20 DIAGNOSIS — E876 Hypokalemia: Secondary | ICD-10-CM | POA: Diagnosis not present

## 2021-01-20 DIAGNOSIS — I253 Aneurysm of heart: Secondary | ICD-10-CM | POA: Diagnosis present

## 2021-01-20 DIAGNOSIS — F909 Attention-deficit hyperactivity disorder, unspecified type: Secondary | ICD-10-CM | POA: Diagnosis present

## 2021-01-20 DIAGNOSIS — E669 Obesity, unspecified: Secondary | ICD-10-CM | POA: Diagnosis present

## 2021-01-20 DIAGNOSIS — Z6831 Body mass index (BMI) 31.0-31.9, adult: Secondary | ICD-10-CM

## 2021-01-20 DIAGNOSIS — I513 Intracardiac thrombosis, not elsewhere classified: Secondary | ICD-10-CM | POA: Diagnosis present

## 2021-01-20 DIAGNOSIS — I252 Old myocardial infarction: Secondary | ICD-10-CM

## 2021-01-20 DIAGNOSIS — J8489 Other specified interstitial pulmonary diseases: Secondary | ICD-10-CM | POA: Diagnosis not present

## 2021-01-20 DIAGNOSIS — Z885 Allergy status to narcotic agent status: Secondary | ICD-10-CM

## 2021-01-20 DIAGNOSIS — I5041 Acute combined systolic (congestive) and diastolic (congestive) heart failure: Secondary | ICD-10-CM | POA: Diagnosis not present

## 2021-01-20 HISTORY — DX: Shortness of breath: R06.02

## 2021-01-20 HISTORY — DX: Tobacco use: Z72.0

## 2021-01-20 HISTORY — DX: Anxiety disorder, unspecified: F41.9

## 2021-01-20 LAB — RESP PANEL BY RT-PCR (FLU A&B, COVID) ARPGX2
Influenza A by PCR: NEGATIVE
Influenza B by PCR: NEGATIVE
SARS Coronavirus 2 by RT PCR: NEGATIVE

## 2021-01-20 LAB — BASIC METABOLIC PANEL
Anion gap: 9 (ref 5–15)
BUN: 13 mg/dL (ref 6–20)
CO2: 21 mmol/L — ABNORMAL LOW (ref 22–32)
Calcium: 9.4 mg/dL (ref 8.9–10.3)
Chloride: 109 mmol/L (ref 98–111)
Creatinine, Ser: 0.64 mg/dL (ref 0.44–1.00)
GFR, Estimated: >60 mL/min (ref >60)
Glucose, Bld: 109 mg/dL — ABNORMAL HIGH (ref 70–99)
Potassium: 3.8 mmol/L (ref 3.5–5.1)
Sodium: 139 mmol/L (ref 135–145)

## 2021-01-20 LAB — CBC WITH DIFFERENTIAL/PLATELET
Abs Immature Granulocytes: 0.02 10*3/uL (ref 0.00–0.07)
Basophils Absolute: 0.1 10*3/uL (ref 0.0–0.1)
Basophils Relative: 1 %
Eosinophils Absolute: 0.1 10*3/uL (ref 0.0–0.5)
Eosinophils Relative: 1 %
HCT: 41.5 % (ref 36.0–46.0)
Hemoglobin: 13.8 g/dL (ref 12.0–15.0)
Immature Granulocytes: 0 %
Lymphocytes Relative: 17 %
Lymphs Abs: 1.9 10*3/uL (ref 0.7–4.0)
MCH: 28.9 pg (ref 26.0–34.0)
MCHC: 33.3 g/dL (ref 30.0–36.0)
MCV: 86.8 fL (ref 80.0–100.0)
Monocytes Absolute: 0.8 10*3/uL (ref 0.1–1.0)
Monocytes Relative: 7 %
Neutro Abs: 8.8 10*3/uL — ABNORMAL HIGH (ref 1.7–7.7)
Neutrophils Relative %: 74 %
Platelets: 349 10*3/uL (ref 150–400)
RBC: 4.78 MIL/uL (ref 3.87–5.11)
RDW: 13.5 % (ref 11.5–15.5)
WBC: 11.7 10*3/uL — ABNORMAL HIGH (ref 4.0–10.5)
nRBC: 0 % (ref 0.0–0.2)

## 2021-01-20 LAB — TROPONIN I (HIGH SENSITIVITY)
Troponin I (High Sensitivity): 16 ng/L (ref <18)
Troponin I (High Sensitivity): 18 ng/L — ABNORMAL HIGH (ref <18)

## 2021-01-20 LAB — BRAIN NATRIURETIC PEPTIDE: B Natriuretic Peptide: 658.1 pg/mL — ABNORMAL HIGH (ref 0.0–100.0)

## 2021-01-20 LAB — T4, FREE: Free T4: 1.02 ng/dL (ref 0.61–1.12)

## 2021-01-20 LAB — TSH: TSH: 1.876 u[IU]/mL (ref 0.350–4.500)

## 2021-01-20 LAB — D-DIMER, QUANTITATIVE: D-Dimer, Quant: 1.26 ug/mL-FEU — ABNORMAL HIGH (ref 0.00–0.50)

## 2021-01-20 MED ORDER — SODIUM CHLORIDE 0.9 % IV BOLUS
500.0000 mL | Freq: Once | INTRAVENOUS | Status: AC
  Administered 2021-01-20: 500 mL via INTRAVENOUS

## 2021-01-20 MED ORDER — BENZONATATE 100 MG PO CAPS: 200.0000 mg | ORAL_CAPSULE | Freq: Three times a day (TID) | ORAL | Status: DC | PRN

## 2021-01-20 MED ORDER — AMPHETAMINE-DEXTROAMPHETAMINE 10 MG PO TABS
30.0000 mg | ORAL_TABLET | Freq: Two times a day (BID) | ORAL | Status: DC
  Administered 2021-01-22: 30 mg via ORAL
  Filled 2021-01-20 (×5): qty 3

## 2021-01-20 MED ORDER — IOHEXOL 350 MG/ML SOLN
100.0000 mL | Freq: Once | INTRAVENOUS | Status: AC | PRN
  Administered 2021-01-20: 81 mL via INTRAVENOUS

## 2021-01-20 MED ORDER — FUROSEMIDE 10 MG/ML IJ SOLN
20.0000 mg | Freq: Once | INTRAMUSCULAR | Status: AC
  Administered 2021-01-20: 20 mg via INTRAVENOUS
  Filled 2021-01-20: qty 2

## 2021-01-20 MED ORDER — SODIUM CHLORIDE 0.9 % IV SOLN: 250.0000 mL | INTRAVENOUS | Status: DC | PRN

## 2021-01-20 MED ORDER — ACETAMINOPHEN 325 MG PO TABS
650.0000 mg | ORAL_TABLET | ORAL | Status: DC | PRN
  Administered 2021-01-21 – 2021-01-23 (×5): 650 mg via ORAL
  Filled 2021-01-20 (×5): qty 2

## 2021-01-20 MED ORDER — ONDANSETRON HCL 4 MG/2ML IJ SOLN: 4.0000 mg | Freq: Four times a day (QID) | INTRAMUSCULAR | Status: DC | PRN

## 2021-01-20 MED ORDER — ALPRAZOLAM 0.5 MG PO TABS
1.0000 mg | ORAL_TABLET | Freq: Three times a day (TID) | ORAL | Status: DC | PRN
  Administered 2021-01-20 – 2021-01-23 (×8): 1 mg via ORAL
  Filled 2021-01-20 (×4): qty 2
  Filled 2021-01-20 (×4): qty 4

## 2021-01-20 MED ORDER — SODIUM CHLORIDE 0.9% FLUSH: 3.0000 mL | INTRAVENOUS | Status: DC | PRN

## 2021-01-20 MED ORDER — VENLAFAXINE HCL ER 75 MG PO CP24
75.0000 mg | ORAL_CAPSULE | Freq: Every day | ORAL | Status: DC
  Administered 2021-01-21 – 2021-01-25 (×4): 75 mg via ORAL
  Filled 2021-01-20 (×4): qty 1

## 2021-01-20 MED ORDER — SODIUM CHLORIDE 0.9% FLUSH
3.0000 mL | Freq: Two times a day (BID) | INTRAVENOUS | Status: DC
  Administered 2021-01-20 – 2021-01-24 (×6): 3 mL via INTRAVENOUS

## 2021-01-20 MED ORDER — CYCLOBENZAPRINE HCL 10 MG PO TABS
20.0000 mg | ORAL_TABLET | Freq: Every day | ORAL | Status: DC
  Administered 2021-01-20 – 2021-01-25 (×5): 20 mg via ORAL
  Filled 2021-01-20 (×5): qty 2

## 2021-01-20 MED ORDER — ENOXAPARIN SODIUM 40 MG/0.4ML IJ SOSY
40.0000 mg | PREFILLED_SYRINGE | INTRAMUSCULAR | Status: DC
  Administered 2021-01-20: 40 mg via SUBCUTANEOUS
  Filled 2021-01-20: qty 0.4

## 2021-01-20 MED ORDER — METOPROLOL SUCCINATE ER 25 MG PO TB24
12.5000 mg | ORAL_TABLET | Freq: Every day | ORAL | Status: DC
  Administered 2021-01-20 – 2021-01-21 (×2): 12.5 mg via ORAL
  Filled 2021-01-20 (×3): qty 1

## 2021-01-20 MED ORDER — ZOLPIDEM TARTRATE 5 MG PO TABS
5.0000 mg | ORAL_TABLET | Freq: Every day | ORAL | Status: DC
  Administered 2021-01-20: 2.5 mg via ORAL
  Administered 2021-01-21 – 2021-01-23 (×3): 5 mg via ORAL
  Filled 2021-01-20 (×5): qty 1

## 2021-01-20 MED ORDER — NICOTINE 21 MG/24HR TD PT24
21.0000 mg | MEDICATED_PATCH | Freq: Every day | TRANSDERMAL | Status: DC
  Administered 2021-01-20 – 2021-01-25 (×6): 21 mg via TRANSDERMAL
  Filled 2021-01-20 (×6): qty 1

## 2021-01-20 MED ORDER — GUAIFENESIN ER 600 MG PO TB12
1200.0000 mg | ORAL_TABLET | Freq: Two times a day (BID) | ORAL | Status: DC
  Administered 2021-01-20: 600 mg via ORAL
  Administered 2021-01-21 – 2021-01-25 (×8): 1200 mg via ORAL
  Filled 2021-01-20 (×9): qty 2

## 2021-01-20 MED ORDER — IPRATROPIUM-ALBUTEROL 0.5-2.5 (3) MG/3ML IN SOLN
3.0000 mL | Freq: Four times a day (QID) | RESPIRATORY_TRACT | Status: DC | PRN
  Administered 2021-01-22: 3 mL via RESPIRATORY_TRACT
  Filled 2021-01-20: qty 3

## 2021-01-20 NOTE — ED Notes (Signed)
Handoff report given to [facility] and to [employer] at [facility]

## 2021-01-20 NOTE — ED Triage Notes (Signed)
Intermittent shortness of breath since last Saturday evening until 0500 Sunday morning.  Per patient, shortness of breath always starts when she is trying to go to sleep.

## 2021-01-20 NOTE — ED Notes (Signed)
ED Provider at bedside. 

## 2021-01-20 NOTE — H&P (Signed)
History and Physical    Christine [name] NTZ:[id number] DOB: 1965-09-04 DOA: 01/20/2021  PCP: Pcp, No (Confirm with patient/family/NH records and if not entered, this has to be entered at [facility] point of entry) Patient coming from: Home  I have personally briefly reviewed patient's old medical records in [place]  Chief Complaint: Cough, SOB  HPI: Christine [name] is a 55 y.o. female with medical history significant of anxiety/depression, ADHD, chronic smoking came with new onset of cough and SOB.  Her symptoms started on Sunday, when she started to develop a dry cough and started to feel shortness of breath, and "tightness" in the chest "all around rib cage like a belt around chest, and could not take deep breath" no fever or chills.  She thought she had "bronchitis" and did not use any OTCs. Over the next few days, her symptoms became worse, even minimum activity [place] triggered shortness of breath and cough.  And she started to feel palpitations since yesterday.  At baseline, she severe anxiety need to take Xanax 1 mg every 6-8 hours otherwise she will feel severe palpitations.  And she had COVID-19 infection in July 2022, and since then she has had frequent night sweat, but no weight loss, or diarrhea.  Today, she went to see urgent care because of severe shortness of breath and palpitations, urgent care doctor suspect new onset of CHF and sent patient to the ED.  She can no longer smoke cigarettes since Sunday because of shortness of breath.  ED Course: Tachycardia, EKG showed sinus tachycardia.  CT angiogram negative for PE but bilateral peripheral infiltrates suspicious for viral pneumonia versus CHF.  And mild bilateral pleural effusion.  CBC, BMP largely within normal limits.  Review of Systems: As per HPI otherwise 14 point review of systems negative.    History reviewed. No pertinent past medical history.  Past Surgical History:  Procedure Laterality Date   ABDOMINAL  HYSTERECTOMY     CHOLECYSTECTOMY     NECK SURGERY     OVARIAN CYST SURGERY       reports that she quit smoking 5 days ago. Her smoking use included cigarettes. She smoked an average of .5 packs per day. She has never used smokeless tobacco. She reports current alcohol use. She reports that she does not use drugs.  Allergies  Allergen Reactions   Sulfa Antibiotics Itching   Erythromycin Hives   Sulfamethoxazole-Trimethoprim Hives   Tramadol Rash and Other (See Comments)    Urinary retention      No family history on file.   Prior to Admission medications   Medication Sig Start Date End Date Taking? Authorizing Provider  ALPRAZolam Duanne Moron) 1 MG tablet Take 1-2 mg by mouth in the morning, at noon, in the evening, and at bedtime. 12/10/17  Yes [provider]  amphetamine-dextroamphetamine (ADDERALL) 30 MG tablet Take 1 tablet by mouth 2 (two) times daily. 07/29/20  Yes [provider]  cyclobenzaprine (FLEXERIL) 10 MG tablet Take 2 tablets by mouth at bedtime. 08/05/20  Yes [provider]  guaiFENesin (MUCINEX) 600 MG 12 hr tablet Take 600 mg by mouth 2 (two) times daily as needed for cough.   Yes [provider]  oxymetazoline ([employer] NASAL SPRAY) 0.05 % nasal spray Place 2 sprays into both nostrils 2 (two) times daily as needed for congestion.   Yes [provider]  venlafaxine (EFFEXOR) 75 MG tablet Take 75 mg by mouth 3 (three) times daily with meals.   Yes [provider]  zolpidem (AMBIEN) 5 MG tablet Take 2.5 mg by mouth at bedtime.   Yes [provider]    Physical Exam: Vitals:   01/20/21 1400 01/20/21 1415 01/20/21 1651 01/20/21 1740  BP: 120/88 123/88 120/82 (!) 121/94  Pulse: (!) 113 (!) 114 (!) 115 (!) 117  Resp: 17 (!) 21 (!) 22 20  Temp:   98.8 F (37.1 C) 99 F (37.2 C)  TempSrc:   Oral Oral  SpO2: 94% 94% 96% 96%  Weight:      Height:        Constitutional: NAD, calm, comfortable Vitals:    01/20/21 1400 01/20/21 1415 01/20/21 1651 01/20/21 1740  BP: 120/88 123/88 120/82 (!) 121/94  Pulse: (!) 113 (!) 114 (!) 115 (!) 117  Resp: 17 (!) 21 (!) 22 20  Temp:   98.8 F (37.1 C) 99 F (37.2 C)  TempSrc:   Oral Oral  SpO2: 94% 94% 96% 96%  Weight:      Height:       Eyes: PERRL, lids and conjunctivae normal ENMT: Mucous membranes are moist. Posterior pharynx clear of any exudate or lesions.Normal dentition.  Neck: normal, supple, no masses, no thyromegaly Respiratory: clear to auscultation bilaterally, no wheezing, B/L basilar crackles. Normal respiratory effort. No accessory muscle use.  Cardiovascular: Regular rate and rhythm, no murmurs / rubs / gallops. No extremity edema. 2+ pedal pulses. No carotid bruits.  Abdomen: no tenderness, no masses palpated. No hepatosplenomegaly. Bowel sounds positive.  Musculoskeletal: no clubbing / cyanosis. No joint deformity upper and lower extremities. Good ROM, no contractures. Normal muscle tone.  Skin: no rashes, lesions, ulcers. No induration Neurologic: CN 2-12 grossly intact. Sensation intact, DTR normal. Strength 5/5 in all 4.  Psychiatric: Normal judgment and insight. Alert and oriented x 3. Normal mood.    Labs on Admission: I have personally reviewed following labs and imaging studies  CBC: Recent Labs  Lab 01/20/21 1139  WBC 11.7*  NEUTROABS 8.8*  HGB 13.8  HCT 41.5  MCV 86.8  PLT 711   Basic Metabolic Panel: Recent Labs  Lab 01/20/21 1139  NA 139  K 3.8  CL 109  CO2 21*  GLUCOSE 109*  BUN 13  CREATININE 0.64  CALCIUM 9.4   GFR: Estimated Creatinine Clearance: 104 mL/min (by C-G formula based on SCr of 0.64 mg/dL). Liver Function Tests: No results for input(s): AST, ALT, ALKPHOS, BILITOT, PROT, ALBUMIN in the last 168 hours. No results for input(s): LIPASE, AMYLASE in the last 168 hours. No results for input(s): AMMONIA in the last 168 hours. Coagulation Profile: No results for input(s): INR, PROTIME in  the last 168 hours. Cardiac Enzymes: No results for input(s): CKTOTAL, CKMB, CKMBINDEX, TROPONINI in the last 168 hours. BNP (last 3 results) No results for input(s): PROBNP in the last 8760 hours. HbA1C: No results for input(s): HGBA1C in the last 72 hours. CBG: No results for input(s): GLUCAP in the last 168 hours. Lipid Profile: No results for input(s): CHOL, HDL, LDLCALC, TRIG, CHOLHDL, LDLDIRECT in the last 72 hours. Thyroid Function Tests: No results for input(s): TSH, T4TOTAL, FREET4, T3FREE, THYROIDAB in the last 72 hours. Anemia Panel: No results for input(s): VITAMINB12, FOLATE, FERRITIN, TIBC, IRON, RETICCTPCT in the last 72 hours. Urine analysis:    Component Value Date/Time   COLORURINE YELLOW 01/01/2008 0920   APPEARANCEUR CLEAR 01/01/2008 0920   LABSPEC 1.015 09/01/2020 1539   PHURINE 6.0 09/01/2020 [place] 09/01/2020 1539  HGBUR NEGATIVE 09/01/2020 [place] 09/01/2020 [place] 09/01/2020 1539   PROTEINUR NEGATIVE 09/01/2020 1539   UROBILINOGEN 0.2 09/01/2020 1539   NITRITE NEGATIVE 09/01/2020 1539   LEUKOCYTESUR NEGATIVE 09/01/2020 1539    Radiological Exams on Admission: DG Chest 2 View  Result Date: 01/20/2021 CLINICAL DATA:  Shortness of breath. EXAM: CHEST - 2 VIEW COMPARISON:  Chest radiograph 05/25/2005 FINDINGS: Patchy airspace opacity at the posterior costophrenic angle. No definite pleural effusion. No pneumothorax. Heart is normal in size. Visualized skeletal structures are unremarkable. Surgical clips in the upper abdomen. IMPRESSION: Patchy airspace opacity in the posterior costophrenic angle may represent pneumonia in the appropriate clinical context. Electronically Signed   By: Ileana Roup M.D.   On: 01/20/2021 12:28   CT Angio Chest PE W and/or Wo Contrast  Result Date: 01/20/2021 CLINICAL DATA:  Intermittent shortness of breath for 6 days. Question pulmonary embolism. EXAM: CT ANGIOGRAPHY  CHEST WITH CONTRAST TECHNIQUE: Multidetector CT imaging of the chest was performed using the standard protocol during bolus administration of intravenous contrast. Multiplanar CT image reconstructions and MIPs were obtained to evaluate the vascular anatomy. CONTRAST:  71m OMNIPAQUE IOHEXOL 350 MG/ML SOLN COMPARISON:  Radiographs 01/20/2021 and 05/25/2005. FINDINGS: Cardiovascular: The pulmonary arteries are well opacified with contrast to the level of the subsegmental branches. There is no evidence of acute pulmonary embolism. Subsegmental assessment mildly limited by breathing artifact. No significant systemic arterial abnormalities are identified, although there is limited opacification of the aorta. Minimal aortic and coronary artery atherosclerosis. The heart size is normal. There is no pericardial effusion. Mediastinum/Nodes: There are no enlarged mediastinal, hilar or axillary lymph nodes. The thyroid gland, trachea and esophagus demonstrate no significant findings. Lungs/Pleura: There are small bilateral pleural effusions. There is diffuse central airway and interstitial thickening throughout both lungs with patchy ground-glass opacities. There are nodular ground-glass components in both upper lobes, most notably on the right on image 30/7. No confluent airspace opacity. Upper abdomen: The visualized upper abdomen appears unremarkable status post cholecystectomy. Musculoskeletal/Chest wall: There is no chest wall mass or suspicious osseous finding. Mild degenerative changes in the spine. Review of the MIP images confirms the above findings. IMPRESSION: 1. No evidence of acute pulmonary embolism or other acute vascular findings in the chest. 2. Patchy ground-glass opacities in both lungs with nodular components in the upper lobes, diffuse central airway and interstitial thickening and small bilateral pleural effusions. Findings are nonspecific, but suspicious for viral infection (consider COVID-19 infection).  Recommend radiographic follow-up. 3. Mild Aortic Atherosclerosis (ICD10-I70.0). Electronically Signed   By: WRichardean SaleM.D.   On: 01/20/2021 14:23    EKG: Independently reviewed.  Sinus tachycardia, no acute ST changes.  Assessment/Plan Active Problems:   CHF (congestive heart failure) ([place]  (please populate well all problems here in Problem List. (For example, if patient is on BP meds at home and you resume or decide to hold them, it is a problem that needs to be her. Same for CAD, COPD, HLD and so on)  New onset of CHF -Clinically, patient appears to be mild fluid overload with bilateral crackles and finding on the CT angiogram showing bilateral mild pleural effusion and pattern of bilateral infiltrate and her BNP significantly elevated. -1 dose of 20 mg IV Lasix given.  Cardiology Dr. ROval Linseyis to see patient tonight. -Echocardiogram, daily weight and I&O's. -Etiology considered to rule out hyperthyroidism.  Check TSH, T4 and T3. -Other DDS, no Hx of COPD in  her past and no significant S/S of bronchial spasms. Recommend outpatient pulm f/u for a fromal PFT.  Leukocytosis -CT chest showed the pattern of B/L peripheral infiltrates, COVID negative. Bacterial PNA less likely.  -Procalcition level  Anxiety/depression -Continue as needed Xanax  ADHD -Continue Adderall.  Cigarette smoke -Nicotine patch.  DVT prophylaxis: Lovenoc Code Status: Full code Family Communication: None at bedside Disposition Plan: Expect more than 2 midnight hospital stay if confirmed onset CHF. Consults called: Cardiology Admission status: Tele admit   Lequita Halt MD [facility] Pager [phone]  01/20/2021, 6:35 PM

## 2021-01-20 NOTE — ED Provider Notes (Signed)
[facility] EMER[facility]ENC[facility] DEPT Provider Note   CSN: [id number] Arrival date & time: 01/20/21  1038     History Chief Complaint  Patient presents with   Shortness of Breath    Christine [name] is a 55 y.o. female with a history of anxiety/depression presenting for shortness of breath.  She states that episodes of shortness of breath started on Sunday night after waking up, she has had no chest pain and her breathing improved after 30 minutes.  On Tuesday after midnight she had another episode that lasted about 30 minutes.  She was also having a little bit of a cough at that time and started taking Mucinex and use it was an infectious origin.  Last night, patient notes that she had 2 different episodes of shortness of breath (at 2 AM and episode lasted 2-1/2 hours, at 5:30 AM had a 1 hour episode).  She went to an urgent care this morning and was told to come to the ED.  Patient notes that she had COVID at the end of July and has had night sweats ever since.  She does not note any associated symptoms such as nausea/vomiting/diarrhea, abdominal pain, reflux.  Patient does note that she has a history of anxiety and currently takes Effexor and Xanax, she states she has not had an anxiety attack in so long she is not sure if this would be a similar presentation.  She states her Xanax controls her anxiety very well.  Has not had any life events or situations that would have increased her anxiety.   Shortness of Breath Associated symptoms: cough (mimial dry cough) and diaphoresis (night sweats)   Associated symptoms: no abdominal pain, no chest pain, no fever, no rash and no vomiting       History reviewed. No pertinent past medical history.  There are no problems to display for this patient.   Past Surgical History:  Procedure Laterality Date   ABDOMINAL HYSTERECTOMY     CHOLECYSTECTOMY     NECK SURGERY     OVARIAN CYST SURGERY       OB History     Gravida  1   Para       Term      Preterm      AB  1   Living         SAB      IAB      Ectopic      Multiple      Live Births              No family history on file.  Social History   Tobacco Use   Smoking status: Former    Packs/day: 0.50    Types: Cigarettes    Quit date: 01/15/2021    Years since quitting: 0.0   Smokeless tobacco: Never  Vaping Use   Vaping Use: Never used  Substance Use Topics   Alcohol use: Yes    Comment: rarely   Drug use: Never    Home Medications Prior to Admission medications   Medication Sig Start Date End Date Taking? Authorizing Provider  ALPRAZolam (XANAX) 1 MG tablet TAKE 1-2 TABLETS BY MOUTH 4 TIMES DAILY. 12/10/17  Yes [provider]  amphetamine-dextroamphetamine (ADDERALL) 30 MG tablet Take 1 tablet by mouth 2 (two) times daily. 07/29/20  Yes [provider]  cyclobenzaprine (FLEXERIL) 10 MG tablet Take 2 tablets by mouth at bedtime. 08/05/20  Yes [provider]  venlafaxine (EFFEXOR) 75 MG  tablet Take by mouth.   Yes [provider]  zolpidem (AMBIEN) 5 MG tablet Take by mouth.   Yes [provider]    Allergies    Sulfa antibiotics, Erythromycin, Sulfamethoxazole-trimethoprim, and Tramadol  Review of Systems   Review of Systems  Constitutional:  Positive for diaphoresis (night sweats). Negative for chills and fever.  HENT:  Negative for congestion and voice change.   Eyes:  Negative for visual disturbance.  Respiratory:  Positive for cough (mimial dry cough) and shortness of breath. Negative for chest tightness.   Cardiovascular:  Positive for palpitations. Negative for chest pain and leg swelling.  Gastrointestinal:  Negative for abdominal pain, constipation, diarrhea, nausea and vomiting.  Genitourinary:  Negative for difficulty urinating, frequency, hematuria and vaginal pain.  Musculoskeletal:  Negative for arthralgias.  Skin:  Negative for color change and rash.  Neurological:   Negative for dizziness, tremors, syncope, weakness and light-headedness.   Physical Exam Updated Vital Signs BP 111/77   Pulse (!) 109   Temp 99.2 F (37.3 C) (Oral)   Resp 19   Ht 5' 10"  (1.778 m)   Wt 99.8 kg   SpO2 95%   BMI 31.57 kg/m   Physical Exam Constitutional:      General: She is not in acute distress.    Appearance: She is well-developed. She is not ill-appearing or diaphoretic.  HENT:     Head: Normocephalic and atraumatic.  Eyes:     Extraocular Movements: Extraocular movements intact.  Cardiovascular:     Rate and Rhythm: Regular rhythm. Tachycardia present.     Heart sounds: No murmur heard. Pulmonary:     Effort: Pulmonary effort is normal. No respiratory distress.     Breath sounds: Examination of the right-lower field reveals rales. Rales present. No decreased breath sounds, wheezing or rhonchi.  Chest:     Chest wall: No mass, tenderness or edema.  Abdominal:     Palpations: Abdomen is soft.  Musculoskeletal:        General: Normal range of motion.     Cervical back: Normal range of motion and neck supple.     Right lower leg: No tenderness. No edema.     Left lower leg: No tenderness. No edema.  Skin:    General: Skin is warm and dry.     Capillary Refill: Capillary refill takes less than 2 seconds.  Neurological:     General: No focal deficit present.     Mental Status: She is alert.    ED Results / Procedures / Treatments   Labs (all labs ordered are listed, but only abnormal results are displayed) Labs Reviewed  CBC WITH DIFFERENTIAL/PLATELET - Abnormal; Notable for the following components:      Result Value   WBC 11.7 (*)    Neutro Abs 8.8 (*)    All other components within normal limits  BASIC METABOLIC PANEL  BRAIN NATRIURETIC PEPTIDE  D-DIMER, QUANTITATIVE  TROPONIN I (HIGH SENSITIVITY)    EKG EKG Interpretation  Date/Time:  Friday January 20 2021 10:56:11 EDT Ventricular Rate:  112 PR Interval:  143 QRS Duration: 111 QT  Interval:  381 QTC Calculation: 521 R Axis:   46 Text Interpretation: Sinus tachycardia Probable left atrial enlargement Left ventricular hypertrophy Inferior infarct, old Prolonged QT interval Baseline wander in lead(s) II III aVL aVF Confirmed by Elnora Morrison [phone]) on 01/20/2021 11:01:29 AM  Radiology No results found.  Procedures Procedures   Medications Ordered in ED Medications - No  data to display  ED Course  I have reviewed the triage vital signs and the nursing notes.  Pertinent labs & imaging results that were available during my care of the patient were reviewed by me and considered in my medical decision making (see chart for details).    MDM Rules/Calculators/A&P  Christine [name] is a 55 y.o. female with a history of anxiety/depression presenting for shortness of breath.  Patient has intermittent shortness of breath with worsening acute episodes that lasted several hours. On the cardiact monitor, patient has sustained sinus tachycardia. Patient has a minimal cough recently but no other associated symptoms.   Differential includes atypical presentation of MI, heart failure, PE, pneumothorax, pleural effusion, possible infectious lung etiology .  EKG showed signs of possible old inferior infarct with no history of MI reported per patient. Collected labs to rule out cardiac and pulmonary sources including D-dimer, troponin, BNP, CBC, BMP.  CXR also ordered.  12:02 PM CBC resulted back with mildly elevated WBC of 11.7. No anemia noted.  Discussed plan of care with Dr. Reather Converse, who will further take-over at this time. Still awaiting the rest of the lab work-up and CXR.  Final Clinical Impression(s) / ED Diagnoses Final diagnoses:  Shortness of breath     Christine Lizer, DO 01/20/21 1206    Elnora Morrison, MD 01/20/21 1550

## 2021-01-20 NOTE — ED Notes (Signed)
Fluids were stopped at request of Dr Reather Converse.

## 2021-01-20 NOTE — Consult Note (Signed)
Cardiology Consultation:   Patient ID: Christine [name] MRN: [id number]; DOB: Nov 23, 1965  Admit date: 01/20/2021 Date of Consult: 01/20/2021  PCP:  Merryl Hacker, No   [facility] Providers Cardiologist:  None   =     Patient Profile:   Christine [name] is a 55 y.o. female with a hx of anxiety/depression and tobacco abuse who is being seen 01/20/2021 for the evaluation of shortness of breath at the request of Dr. Reather Converse.  History of Present Illness:   Ms. Riemann presented to [facility] with report of intermittent chest tightness and shortness of breath.  Her symptoms started approximately 5 days ago.  She was laying in the bed Sunday when she awoke feeling like her chest was very tight.  She does not think that there is an exertional component.  At first she was not short of breath.  She was able to do her work tasks without difficulty.  However her symptoms recurred with subsequent night.  Today she was also short of breath with ambulation.  She went to urgent care and was referred to the emergency department.  She denies any fevers or chills.  She does report a nonproductive cough.  She denies a history of GERD.  Of note, she reports having COVID 10/2020 and suffering with night sweats ever since.  She notes that she has not smoked since Sunday, as she has been unable to inhale.  In the ED she was noted to be tachycardic to the 110s to 120s.  D-dimer was positive.  She had a chest CT-A that was negative for PE.  It did note patchy groundglass opacities bilaterally with central airway interstitial thickening and small pleural effusions.  Concerning for COVID-19 infection.  She was noted to have mild atherosclerosis of the aorta.  She was negative for COVID-19 and influenza.  High-sensitivity troponin was initially 16 and increased to 18 on repeat.    BNP was elevated to 658.  She had a mild leukocytosis with a white count of 11.7.  Cardiology was consulted for question  of heart failure.  Past Medical History:  Diagnosis Date   Anxiety 01/20/2021   Shortness of breath 01/20/2021   Tobacco abuse 01/20/2021    Past Surgical History:  Procedure Laterality Date   ABDOMINAL HYSTERECTOMY     CHOLECYSTECTOMY     NECK SURGERY     OVARIAN CYST SURGERY         Inpatient Medications: Scheduled Meds:  amphetamine-dextroamphetamine  30 mg Oral BID   cyclobenzaprine  20 mg Oral QHS   enoxaparin (LOVENOX) injection  40 mg Subcutaneous Q24H   furosemide  20 mg Intravenous Once   guaiFENesin  1,200 mg Oral BID   metoprolol succinate  12.5 mg Oral Daily   nicotine  21 mg Transdermal Daily   sodium chloride flush  3 mL Intravenous Q12H   [START ON 01/21/2021] venlafaxine XR  75 mg Oral Q breakfast   zolpidem  5 mg Oral QHS   Continuous Infusions:  sodium chloride     PRN Meds: sodium chloride, acetaminophen, ALPRAZolam, benzonatate, ipratropium-albuterol, ondansetron (ZOFRAN) IV, sodium chloride flush  Allergies:    Allergies  Allergen Reactions   Sulfa Antibiotics Itching   Erythromycin Hives   Sulfamethoxazole-Trimethoprim Hives   Tramadol Rash and Other (See Comments)    Urinary retention      Social History:   Social History   Socioeconomic History   Marital status: Single    Spouse name: Not on  file   Number of children: Not on file   Years of education: Not on file   Highest education level: Not on file  Occupational History   Not on file  Tobacco Use   Smoking status: Former    Packs/day: 0.50    Types: Cigarettes    Quit date: 01/15/2021    Years since quitting: 0.0   Smokeless tobacco: Never  Vaping Use   Vaping Use: Never used  Substance and Sexual Activity   Alcohol use: Yes    Comment: rarely   Drug use: Never   Sexual activity: Not on file  Other Topics Concern   Not on file  Social History Narrative   Not on file   Social Determinants of Health   Financial Resource Strain: Not on file  Food Insecurity: Not on  file  Transportation Needs: Not on file  Physical Activity: Not on file  Stress: Not on file  Social Connections: Not on file  Intimate Partner Violence: Not on file    Family History:    Family History  Problem Relation Age of Onset   Stroke Mother    Atrial fibrillation Mother    Heart failure Father      ROS:  Please see the history of present illness.   All other ROS reviewed and negative.     Physical Exam/Data:   Vitals:   01/20/21 1400 01/20/21 1415 01/20/21 1651 01/20/21 1740  BP: 120/88 123/88 120/82 (!) 121/94  Pulse: (!) 113 (!) 114 (!) 115 (!) 117  Resp: 17 (!) 21 (!) 22 20  Temp:   98.8 F (37.1 C) 99 F (37.2 C)  TempSrc:   Oral Oral  SpO2: 94% 94% 96% 96%  Weight:      Height:        Intake/Output Summary (Last 24 hours) at 01/20/2021 1900 Last data filed at 01/20/2021 1615 Gross per 24 hour  Intake 254.96 ml  Output --  Net 254.96 ml   Last 3 Weights 01/20/2021  Weight (lbs) 220 lb  Weight (kg) 99.791 kg     VS:  BP (!) 121/94 (BP Location: Left Arm)   Pulse (!) 117   Temp 99 F (37.2 C) (Oral)   Resp 20   Ht 5' 10"  (1.778 m)   Wt 99.8 kg   SpO2 96%   BMI 31.57 kg/m  , BMI Body mass index is 31.57 kg/m. GENERAL:  Well appearing HEENT: Pupils equal round and reactive, fundi not visualized, oral mucosa unremarkable NECK:  No jugular venous distention, waveform within normal limits, carotid upstroke brisk and symmetric, no bruits LUNGS:  Bibasilar crackles HEART:  Tachycardic.  Regular rhythm.  PMI not displaced or sustained,S1 and S2 within normal limits, no S3, no S4, no clicks, no rubs, no murmurs ABD:  Flat, positive bowel sounds normal in frequency in pitch, no bruits, no rebound, no guarding, no midline pulsatile mass, no hepatomegaly, no splenomegaly EXT:  2 plus pulses throughout, no edema, no cyanosis no clubbing SKIN:  No rashes no nodules NEURO:  Cranial nerves II through XII grossly intact, motor grossly intact  throughout PSYCH:  Cognitively intact, oriented to person place and time   EKG:  The EKG was personally reviewed and demonstrates:  Sinus tachycardia.  Rate 112 bpm.  LVH.  Prior inferior infarct.  QTC 521 ms. Telemetry:  Telemetry was personally reviewed and demonstrates: Sinus tachycardia  Relevant CV Studies: Echo pending  Laboratory Data:  High Sensitivity Troponin:  Recent Labs  Lab 01/20/21 1139 01/20/21 1333  TROPONINIHS 16 18*     Chemistry Recent Labs  Lab 01/20/21 1139  NA 139  K 3.8  CL 109  CO2 21*  GLUCOSE 109*  BUN 13  CREATININE 0.64  CALCIUM 9.4  GFRNONAA >60  ANIONGAP 9    No results for input(s): PROT, ALBUMIN, AST, ALT, ALKPHOS, BILITOT in the last 168 hours. Lipids No results for input(s): CHOL, TRIG, HDL, LABVLDL, LDLCALC, CHOLHDL in the last 168 hours.  Hematology Recent Labs  Lab 01/20/21 1139  WBC 11.7*  RBC 4.78  HGB 13.8  HCT 41.5  MCV 86.8  MCH 28.9  MCHC 33.3  RDW 13.5  PLT 349   Thyroid No results for input(s): TSH, FREET4 in the last 168 hours.  BNP Recent Labs  Lab 01/20/21 1139  BNP 658.1*    DDimer  Recent Labs  Lab 01/20/21 1126  DDIMER 1.26*     Radiology/Studies:  DG Chest 2 View  Result Date: 01/20/2021 CLINICAL DATA:  Shortness of breath. EXAM: CHEST - 2 VIEW COMPARISON:  Chest radiograph 05/25/2005 FINDINGS: Patchy airspace opacity at the posterior costophrenic angle. No definite pleural effusion. No pneumothorax. Heart is normal in size. Visualized skeletal structures are unremarkable. Surgical clips in the upper abdomen. IMPRESSION: Patchy airspace opacity in the posterior costophrenic angle may represent pneumonia in the appropriate clinical context. Electronically Signed   By: Ileana Roup M.D.   On: 01/20/2021 12:28   CT Angio Chest PE W and/or Wo Contrast  Result Date: 01/20/2021 CLINICAL DATA:  Intermittent shortness of breath for 6 days. Question pulmonary embolism. EXAM: CT ANGIOGRAPHY CHEST WITH  CONTRAST TECHNIQUE: Multidetector CT imaging of the chest was performed using the standard protocol during bolus administration of intravenous contrast. Multiplanar CT image reconstructions and MIPs were obtained to evaluate the vascular anatomy. CONTRAST:  53m OMNIPAQUE IOHEXOL 350 MG/ML SOLN COMPARISON:  Radiographs 01/20/2021 and 05/25/2005. FINDINGS: Cardiovascular: The pulmonary arteries are well opacified with contrast to the level of the subsegmental branches. There is no evidence of acute pulmonary embolism. Subsegmental assessment mildly limited by breathing artifact. No significant systemic arterial abnormalities are identified, although there is limited opacification of the aorta. Minimal aortic and coronary artery atherosclerosis. The heart size is normal. There is no pericardial effusion. Mediastinum/Nodes: There are no enlarged mediastinal, hilar or axillary lymph nodes. The thyroid gland, trachea and esophagus demonstrate no significant findings. Lungs/Pleura: There are small bilateral pleural effusions. There is diffuse central airway and interstitial thickening throughout both lungs with patchy ground-glass opacities. There are nodular ground-glass components in both upper lobes, most notably on the right on image 30/7. No confluent airspace opacity. Upper abdomen: The visualized upper abdomen appears unremarkable status post cholecystectomy. Musculoskeletal/Chest wall: There is no chest wall mass or suspicious osseous finding. Mild degenerative changes in the spine. Review of the MIP images confirms the above findings. IMPRESSION: 1. No evidence of acute pulmonary embolism or other acute vascular findings in the chest. 2. Patchy ground-glass opacities in both lungs with nodular components in the upper lobes, diffuse central airway and interstitial thickening and small bilateral pleural effusions. Findings are nonspecific, but suspicious for viral infection (consider COVID-19 infection). Recommend  radiographic follow-up. 3. Mild Aortic Atherosclerosis (ICD10-I70.0). Electronically Signed   By: WRichardean SaleM.D.   On: 01/20/2021 14:23     Assessment and Plan:   #Acute heart failure, type unknown: Symptoms and elevated BNP are concerning for heart failure.  EKG concerning for  prior inferior infarct.  Her chest pain now seems very atypical and not exertional.  We will wait for her echo to determine her need for an ischemic evaluation.  For now, agree with starting diuresis with IV Lasix.  She is also tachycardic.  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  Agree with starting metoprolol.  We will check thyroid function.  Imaging findings were concerning for viral infection versus edema.  COVID-19 and influenza were negative.  #Chest pain: Symptoms atypical.  Consider ischemic evaluation based on echo findings.  Given that her chest pain also only occurs when laying down, concern for GERD.  However this would not explain the elevated BNP.  #Sinus tachycardia: Patient remains tachycardic on exam.  BNP was positive but chest CT was negative for PE.  Agree with checking TSH and starting metoprolol.    Risk Assessment/Risk Scores:        New York Heart Association (NYHA) Functional Class NYHA Class III        For questions or updates, please contact [facility] Please consult [email or web address] for contact info under    Signed, Skeet Latch, MD  01/20/2021 7:00 PM

## 2021-01-20 NOTE — ED Notes (Signed)
Patient transported to CT 

## 2021-01-21 ENCOUNTER — Inpatient Hospital Stay (HOSPITAL_COMMUNITY): Payer: Medicaid Other

## 2021-01-21 DIAGNOSIS — F419 Anxiety disorder, unspecified: Secondary | ICD-10-CM | POA: Diagnosis not present

## 2021-01-21 DIAGNOSIS — I5021 Acute systolic (congestive) heart failure: Secondary | ICD-10-CM | POA: Diagnosis not present

## 2021-01-21 DIAGNOSIS — I509 Heart failure, unspecified: Secondary | ICD-10-CM | POA: Diagnosis not present

## 2021-01-21 DIAGNOSIS — R0602 Shortness of breath: Secondary | ICD-10-CM | POA: Diagnosis not present

## 2021-01-21 DIAGNOSIS — R0789 Other chest pain: Secondary | ICD-10-CM | POA: Diagnosis not present

## 2021-01-21 LAB — HEPARIN LEVEL (UNFRACTIONATED): Heparin Unfractionated: 0.27 IU/mL — ABNORMAL LOW (ref 0.30–0.70)

## 2021-01-21 LAB — ECHOCARDIOGRAM COMPLETE
Area-P 1/2: 6.96 cm2
Calc EF: 23.1 %
Height: 70 in
MV M vel: 4.38 m/s
MV Peak grad: 76.7 mmHg
Radius: 0.6 cm
S' Lateral: 5.8 cm
Single Plane A2C EF: 25.7 %
Single Plane A4C EF: 21.3 %
Weight: 3472 oz

## 2021-01-21 LAB — CBC
HCT: 42.1 % (ref 36.0–46.0)
Hemoglobin: 14.3 g/dL (ref 12.0–15.0)
MCH: 29.5 pg (ref 26.0–34.0)
MCHC: 34 g/dL (ref 30.0–36.0)
MCV: 86.8 fL (ref 80.0–100.0)
Platelets: 349 10*3/uL (ref 150–400)
RBC: 4.85 MIL/uL (ref 3.87–5.11)
RDW: 13.4 % (ref 11.5–15.5)
WBC: 11.7 10*3/uL — ABNORMAL HIGH (ref 4.0–10.5)
nRBC: 0 % (ref 0.0–0.2)

## 2021-01-21 LAB — BASIC METABOLIC PANEL
Anion gap: 8 (ref 5–15)
BUN: 9 mg/dL (ref 6–20)
CO2: 22 mmol/L (ref 22–32)
Calcium: 8.9 mg/dL (ref 8.9–10.3)
Chloride: 106 mmol/L (ref 98–111)
Creatinine, Ser: 0.69 mg/dL (ref 0.44–1.00)
GFR, Estimated: >60 mL/min (ref >60)
Glucose, Bld: 117 mg/dL — ABNORMAL HIGH (ref 70–99)
Potassium: 3.4 mmol/L — ABNORMAL LOW (ref 3.5–5.1)
Sodium: 136 mmol/L (ref 135–145)

## 2021-01-21 LAB — HEPATIC FUNCTION PANEL
ALT: 14 U/L (ref 0–44)
AST: 18 U/L (ref 15–41)
Albumin: 3.5 g/dL (ref 3.5–5.0)
Alkaline Phosphatase: 72 U/L (ref 38–126)
Bilirubin, Direct: 0.1 mg/dL (ref 0.0–0.2)
Indirect Bilirubin: 1 mg/dL — ABNORMAL HIGH (ref 0.3–0.9)
Total Bilirubin: 1.1 mg/dL (ref 0.3–1.2)
Total Protein: 6.6 g/dL (ref 6.5–8.1)

## 2021-01-21 LAB — PROCALCITONIN
Procalcitonin: <0.1 ng/mL
Procalcitonin: 0.45 ng/mL

## 2021-01-21 LAB — GLUCOSE, CAPILLARY: Glucose-Capillary: 129 mg/dL — ABNORMAL HIGH (ref 70–99)

## 2021-01-21 LAB — HIV ANTIBODY (ROUTINE TESTING W REFLEX): HIV Screen 4th Generation wRfx: NONREACTIVE

## 2021-01-21 LAB — MAGNESIUM: Magnesium: 1.7 mg/dL (ref 1.7–2.4)

## 2021-01-21 LAB — PHOSPHORUS: Phosphorus: 3.4 mg/dL (ref 2.5–4.6)

## 2021-01-21 MED ORDER — POTASSIUM CHLORIDE CRYS ER 20 MEQ PO TBCR
40.0000 meq | EXTENDED_RELEASE_TABLET | Freq: Two times a day (BID) | ORAL | Status: AC
  Administered 2021-01-21 (×2): 40 meq via ORAL
  Filled 2021-01-21 (×2): qty 2

## 2021-01-21 MED ORDER — LIDOCAINE 5 % EX PTCH
1.0000 | MEDICATED_PATCH | CUTANEOUS | Status: DC
  Administered 2021-01-21 – 2021-01-24 (×4): 1 via TRANSDERMAL
  Filled 2021-01-21 (×4): qty 1

## 2021-01-21 MED ORDER — KETOROLAC TROMETHAMINE 15 MG/ML IJ SOLN
15.0000 mg | Freq: Once | INTRAMUSCULAR | Status: AC
  Administered 2021-01-21: 15 mg via INTRAVENOUS
  Filled 2021-01-21: qty 1

## 2021-01-21 MED ORDER — HEPARIN BOLUS VIA INFUSION
5000.0000 [IU] | Freq: Once | INTRAVENOUS | Status: AC
  Administered 2021-01-21: 5000 [IU] via INTRAVENOUS
  Filled 2021-01-21: qty 5000

## 2021-01-21 MED ORDER — ALPRAZOLAM 0.5 MG PO TABS
1.0000 mg | ORAL_TABLET | Freq: Once | ORAL | Status: AC
  Administered 2021-01-21: 1 mg via ORAL
  Filled 2021-01-21: qty 2

## 2021-01-21 MED ORDER — PERFLUTREN LIPID MICROSPHERE
1.0000 mL | INTRAVENOUS | Status: AC | PRN
  Administered 2021-01-21: 2 mL via INTRAVENOUS
  Filled 2021-01-21: qty 10

## 2021-01-21 MED ORDER — HEPARIN (PORCINE) 25000 UT/250ML-% IV SOLN
1550.0000 [IU]/h | INTRAVENOUS | Status: DC
  Administered 2021-01-21: 1400 [IU]/h via INTRAVENOUS
  Administered 2021-01-22 – 2021-01-23 (×3): 1550 [IU]/h via INTRAVENOUS
  Filled 2021-01-21 (×4): qty 250

## 2021-01-21 NOTE — Progress Notes (Signed)
Echocardiogram 2D Echocardiogram has been performed.  Christine [name] 01/21/2021, 2:29 PM

## 2021-01-21 NOTE — Progress Notes (Signed)
PROGRESS NOTE    Christine [name]  MGN:[id number] DOB: [birth year] DOA: 01/20/2021 PCP: Pcp, No   Brief Narrative: The patient is a 55 year old obese female with a past medical history significant for but not limited to anxiety, depression, ADHD, history of chronic tobacco abuse and smoking who presented with a new onset of cough and shortness of breath.  She states that symptoms started last Sunday when she started to develop a dry cough and started to feel short of breath and tightness in her chest as well as "all around her rib cage like a belt around her chest and I could not take a deep breath".  She denied any fevers or chills that she had bronchitis and did not use any over-the-counter medications.  When asked few days her symptoms became worse with minimal activity triggering shortness of breath and cough.  She also states that she felt palpitations yesterday.  At baseline she had severe anxiety and needed to take Xanax 1 mg every 6-8 hours otherwise she would feel severe palpitations.  She already had COVID-19 infection in July 2022 and reports that she has had frequent night sweats after but no weight loss or diarrhea.  She went to urgent care because of her severe shortness of breath and palpitations in urgent care suspected new onset CHF and sent the patient to the ED.  She states that she was not able to smoke her cigarettes due to her shortness of breath.  In the ED she had an EKG done which showed sinus tachycardia.  She had CT of the chest which was negative for PE but did show bilateral peripheral infiltrates suspicious for viral pneumonia versus CHF.  She also had mild bilateral pleural effusions and CBC in BMP were largely within normal limits but BNP was elevated.  Assessment & Plan:   Active Problems:   CHF (congestive heart failure) (HCC)   Anxiety   Tobacco abuse   Shortness of breath   Chest tightness  New onset CHF, unclear type -Clinically the patient appeared to have mild  fluid overload with bilateral crackles -She had an elevated D-Dimer of 1.26 so obtained CTA of the chest which showed "No evidence of acute pulmonary embolism or other acute vascular findings in the chest. Patchy ground-glass opacities in both lungs with nodular components in the upper lobes, diffuse central airway and interstitial thickening and small bilateral pleural effusions. Findings are nonspecific, but suspicious for viral infection (consider COVID-19 infection). Recommend radiographic follow-up. Mild Aortic Atherosclerosis" -BNP elevated at 658.1 -She is given a dose of IV Lasix 20 mg yesterday -Cardiology Dr. Oval Linsey was consulted -Echocardiogram was ordered and still pending to be done and per patient report will be done at 2 PM -We will need strict I's and O's and daily weights -She had a unclear etiology but will need to rule out thyroid disorders and will check a TSH, T4 and T3; TSH was 1.876 and T4 was 1.02 and free T4 still pending -Started on Metoprolol Succinate 12.5 mg po Daily -Other differential diagnosis could include COPD but no history of COPD in the past despite her smoking.  We will recommend outpatient pulmonary follow-up for formal PFT testing  Chest Pain/Discomfort, improved -Symptoms are Atypical  -Troponin went from 16 -> 18 -Cardiology recommending considering ischemic evaluation based on ECHO -CP also occurs when laying down so ? GERD -Started the patient on Metoprolol Succinate 12.5 mg po Daily   Back Pain -Developed after her Lasix  -Improved with acetaminophen but  then happened again and has been more constant -We will try lidocaine patch -CTA of the chest showed "No significant systemic arterial abnormalities are identified, although there is limited opacification of the aorta. Minimal aortic and coronary artery atherosclerosis. The heart size is normal. There is no pericardial effusion." As well as "  Sinus Tachycardia -No PE on Exam -TSH normal; Could  be a result of her Amphetamine for her ADHD -Given 500 mL bolus on Admission and will hold further Fluid administration -HR ranging from 100-118 and now improved  -C/w low dose Metoprolol Succinate 12.5 mg po Daily   ? Viral PNA -CTA finidings as above -Patient had COVID infection in July and tested Negative here -C/w Supportive Care and c/w Guaifenesin 1200 mg po BID and Benzonatate 200 mg po TIDprn Cough   -C/w DuoNeb 3 mL q6hprn Wheezing and SOB -Repeat Imaging and follow for Radiographic Improvement; Obtaining CXR today  -SpO2: 93 % -She will an Ambulatory Home O2 screen prior to D/C  -Repeat CXR this AM showed "The heart size and mediastinal contours are stable. Both lungs are clear. Minimal linear atelectasis of both lung bases are identified. The visualized skeletal structures are unremarkable."  Hypokalemia -Mild. K+ went from 3.8 -> 3.4 -Check Mag Level -Replete with po Kcl 40 mEQ BID x2 -Continue to Monitor and Repelet as Necessary   Leukocytosis -WBC went from 11.7 -> 11.7 -PCT went from <0.10 -> 0.45 -Continue to Monitor for S/Sx of Infection -Repeat CBC in the AM   Anxiety and Depression -C/w Venlafaxine XR 75 mg po Daily and Alprazolam 1 mg po TIDprn Anxiety   ADHD -C/w Amphetamine-Dextroamphetamine 30 mg po BID  Tobacco abuse and Cigarette Smoker -Smoking cessation counseling -Continue with Nicotine Patch 21 mg TD  Obesity -Complicates overall prognosis and care -Estimated body mass index is 31.14 kg/m as calculated from the following:   Height as of this encounter: 5' 10"  (1.778 m).   Weight as of this encounter: 98.4 kg. -Weight Loss and Dietary Counseling given   DVT prophylaxis: Enoxaparin 40 mg sq q24h Code Status: FULL CODE  Family Communication: No family currently present at bedside Disposition Plan: We will need to have cardiac clearance prior to discharge  Status is: Inpatient  Remains inpatient appropriate because:Unsafe d/c plan, IV  treatments appropriate due to intensity of illness or inability to take PO, and Inpatient level of care appropriate due to severity of illness  Dispo: The patient is from: Home              Anticipated d/c is to: Home              Patient currently is not medically stable to d/c.   Difficult to place patient No  Consultants:  Cardiology   Procedures:  ECHOCARDIOGRAM  Antimicrobials:  Anti-infectives (From admission, onward)    None        Subjective: Seen and examined at bedside and states that her shortness of breath is much better and denies any chest pain.  She states that after the Lasix yesterday she developed significant upper back pain.  She states the back pain is now constant and trying to radiate to her front.  No nausea or vomiting..  States that she did not sleep very well last night and the bed was uncomfortable.  Feels relatively okay otherwise.  No other concerns or plans at this time.  Objective: Vitals:   01/20/21 2042 01/21/21 0200 01/21/21 0351 01/21/21 0732  BP: 119/78  113/79 108/75 109/70  Pulse: (!) 118 (!) 105 (!) 101 100  Resp: 18  20   Temp: 98.4 F (36.9 C) 98 F (36.7 C) 98.5 F (36.9 C) 99 F (37.2 C)  TempSrc: Oral Oral Oral Oral  SpO2: 95% 95% 96% 93%  Weight:   98.4 kg   Height:        Intake/Output Summary (Last 24 hours) at 01/21/2021 3329 Last data filed at 01/20/2021 2205 Gross per 24 hour  Intake 254.96 ml  Output 600 ml  Net -345.04 ml   Filed Weights   01/20/21 1048 01/21/21 0351  Weight: 99.8 kg 98.4 kg   Examination: Physical Exam:  Constitutional: WN/WD obese Caucasian female currently no acute distress appears calm but slightly uncomfortable Eyes: Lids and conjunctivae normal, sclerae anicteric  ENMT: External Ears, Nose appear normal. Grossly normal hearing. Mucous membranes are moist. Neck: Appears normal, supple, no cervical masses, normal ROM, no appreciable thyromegaly; no appreciable JVD Respiratory: Diminished  to auscultation bilaterally, no wheezing, rales, rhonchi or crackles. Normal respiratory effort and patient is not tachypenic. No accessory muscle use.  Unlabored breathing Cardiovascular: RRR, no murmurs / rubs / gallops. S1 and S2 auscultated.  Minimal extremity edema Abdomen: Soft, non-tender, distended secondary by habitus. Bowel sounds positive.  GU: Deferred. Musculoskeletal: No clubbing / cyanosis of digits/nails. No joint deformity upper and lower extremities.  Skin: No rashes, lesions, ulcers on limited skin evaluation. No induration; Warm and dry.  Neurologic: CN 2-12 grossly intact with no focal deficits. Romberg sign and cerebellar reflexes not assessed.  Psychiatric: Normal judgment and insight. Alert and oriented x 3. Normal mood and appropriate affect.   Data Reviewed: I have personally reviewed following labs and imaging studies  CBC: Recent Labs  Lab 01/20/21 1139 01/21/21 0318  WBC 11.7* 11.7*  NEUTROABS 8.8*  --   HGB 13.8 14.3  HCT 41.5 42.1  MCV 86.8 86.8  PLT 349 518   Basic Metabolic Panel: Recent Labs  Lab 01/20/21 1139 01/21/21 0318  NA 139 136  K 3.8 3.4*  CL 109 106  CO2 21* 22  GLUCOSE 109* 117*  BUN 13 9  CREATININE 0.64 0.69  CALCIUM 9.4 8.9   GFR: Estimated Creatinine Clearance: 103.4 mL/min (by C-G formula based on SCr of 0.69 mg/dL). Liver Function Tests: No results for input(s): AST, ALT, ALKPHOS, BILITOT, PROT, ALBUMIN in the last 168 hours. No results for input(s): LIPASE, AMYLASE in the last 168 hours. No results for input(s): AMMONIA in the last 168 hours. Coagulation Profile: No results for input(s): INR, PROTIME in the last 168 hours. Cardiac Enzymes: No results for input(s): CKTOTAL, CKMB, CKMBINDEX, TROPONINI in the last 168 hours. BNP (last 3 results) No results for input(s): PROBNP in the last 8760 hours. HbA1C: No results for input(s): HGBA1C in the last 72 hours. CBG: No results for input(s): GLUCAP in the last 168  hours. Lipid Profile: No results for input(s): CHOL, HDL, LDLCALC, TRIG, CHOLHDL, LDLDIRECT in the last 72 hours. Thyroid Function Tests: Recent Labs    01/20/21 1814  TSH 1.876  FREET4 1.02   Anemia Panel: No results for input(s): VITAMINB12, FOLATE, FERRITIN, TIBC, IRON, RETICCTPCT in the last 72 hours. Sepsis Labs: Recent Labs  Lab 01/20/21 2000 01/21/21 0318  PROCALCITON <0.10 0.45    Recent Results (from the past 240 hour(s))  Resp Panel by RT-PCR (Flu A&B, Covid) Nasopharyngeal Swab     Status: None   Collection Time: 01/20/21  3:20 PM  Specimen: Nasopharyngeal Swab; Nasopharyngeal(NP) swabs in vial transport medium  Result Value Ref Range Status   SARS Coronavirus 2 by RT PCR NEGATIVE NEGATIVE Final    Comment: (NOTE) SARS-CoV-2 target nucleic acids are NOT DETECTED.  The SARS-CoV-2 RNA is generally detectable in upper respiratory specimens during the acute phase of infection. The lowest concentration of SARS-CoV-2 viral copies this assay can detect is 138 copies/mL. A negative result does not preclude SARS-Cov-2 infection and should not be used as the sole basis for treatment or other patient management decisions. A negative result may occur with  improper specimen collection/handling, submission of specimen other than nasopharyngeal swab, presence of viral mutation(s) within the areas targeted by this assay, and inadequate number of viral copies(<138 copies/mL). A negative result must be combined with clinical observations, patient history, and epidemiological information. The expected result is Negative.  Fact Sheet for Patients:  EntrepreneurPulse.com.au  Fact Sheet for Healthcare Providers:  IncredibleEmployment.be  This test is no t yet approved or cleared by the [place] FDA and  has been authorized for detection and/or diagnosis of SARS-CoV-2 by FDA under an Emergency Use Authorization (EUA). This EUA will  remain  in effect (meaning this test can be used) for the duration of the COVID-19 declaration under Section 564(b)(1) of the Act, 21 U.S.C.section 360bbb-3(b)(1), unless the authorization is terminated  or revoked sooner.       Influenza A by PCR NEGATIVE NEGATIVE Final   Influenza B by PCR NEGATIVE NEGATIVE Final    Comment: (NOTE) The Xpert Xpress SARS-CoV-2/FLU/RSV plus assay is intended as an aid in the diagnosis of influenza from Nasopharyngeal swab specimens and should not be used as a sole basis for treatment. Nasal washings and aspirates are unacceptable for Xpert Xpress SARS-CoV-2/FLU/RSV testing.  Fact Sheet for Patients: EntrepreneurPulse.com.au  Fact Sheet for Healthcare Providers: IncredibleEmployment.be  This test is not yet approved or cleared by the [place] FDA and has been authorized for detection and/or diagnosis of SARS-CoV-2 by FDA under an Emergency Use Authorization (EUA). This EUA will remain in effect (meaning this test can be used) for the duration of the COVID-19 declaration under Section 564(b)(1) of the Act, 21 U.S.C. section 360bbb-3(b)(1), unless the authorization is terminated or revoked.  Performed at [facility], [address] [place], [place] [address]     RN Pressure Injury Documentation:     Estimated body mass index is 31.14 kg/m as calculated from the following:   Height as of this encounter: 5' 10"  (1.778 m).   Weight as of this encounter: 98.4 kg.  Malnutrition Type:   Malnutrition Characteristics:  Nutrition Interventions:    Radiology Studies: DG Chest 2 View  Result Date: 01/20/2021 CLINICAL DATA:  Shortness of breath. EXAM: CHEST - 2 VIEW COMPARISON:  Chest radiograph 05/25/2005 FINDINGS: Patchy airspace opacity at the posterior costophrenic angle. No definite pleural effusion. No pneumothorax. Heart is normal in size. Visualized skeletal structures are  unremarkable. Surgical clips in the upper abdomen. IMPRESSION: Patchy airspace opacity in the posterior costophrenic angle may represent pneumonia in the appropriate clinical context. Electronically Signed   By: Ileana Roup M.D.   On: 01/20/2021 12:28   CT Angio Chest PE W and/or Wo Contrast  Result Date: 01/20/2021 CLINICAL DATA:  Intermittent shortness of breath for 6 days. Question pulmonary embolism. EXAM: CT ANGIOGRAPHY CHEST WITH CONTRAST TECHNIQUE: Multidetector CT imaging of the chest was performed using the standard protocol during bolus administration of intravenous contrast. Multiplanar CT image reconstructions  and MIPs were obtained to evaluate the vascular anatomy. CONTRAST:  72m OMNIPAQUE IOHEXOL 350 MG/ML SOLN COMPARISON:  Radiographs 01/20/2021 and 05/25/2005. FINDINGS: Cardiovascular: The pulmonary arteries are well opacified with contrast to the level of the subsegmental branches. There is no evidence of acute pulmonary embolism. Subsegmental assessment mildly limited by breathing artifact. No significant systemic arterial abnormalities are identified, although there is limited opacification of the aorta. Minimal aortic and coronary artery atherosclerosis. The heart size is normal. There is no pericardial effusion. Mediastinum/Nodes: There are no enlarged mediastinal, hilar or axillary lymph nodes. The thyroid gland, trachea and esophagus demonstrate no significant findings. Lungs/Pleura: There are small bilateral pleural effusions. There is diffuse central airway and interstitial thickening throughout both lungs with patchy ground-glass opacities. There are nodular ground-glass components in both upper lobes, most notably on the right on image 30/7. No confluent airspace opacity. Upper abdomen: The visualized upper abdomen appears unremarkable status post cholecystectomy. Musculoskeletal/Chest wall: There is no chest wall mass or suspicious osseous finding. Mild degenerative changes in the  spine. Review of the MIP images confirms the above findings. IMPRESSION: 1. No evidence of acute pulmonary embolism or other acute vascular findings in the chest. 2. Patchy ground-glass opacities in both lungs with nodular components in the upper lobes, diffuse central airway and interstitial thickening and small bilateral pleural effusions. Findings are nonspecific, but suspicious for viral infection (consider COVID-19 infection). Recommend radiographic follow-up. 3. Mild Aortic Atherosclerosis (ICD10-I70.0). Electronically Signed   By: WRichardean SaleM.D.   On: 01/20/2021 14:23    Scheduled Meds:  amphetamine-dextroamphetamine  30 mg Oral BID   cyclobenzaprine  20 mg Oral QHS   enoxaparin (LOVENOX) injection  40 mg Subcutaneous Q24H   guaiFENesin  1,200 mg Oral BID   metoprolol succinate  12.5 mg Oral Daily   nicotine  21 mg Transdermal Daily   sodium chloride flush  3 mL Intravenous Q12H   venlafaxine XR  75 mg Oral Q breakfast   zolpidem  5 mg Oral QHS   Continuous Infusions:  sodium chloride      LOS: 1 day   OKerney Elbe DO [facility] PAGER is on AMION  If 7PM-7AM, please contact night-coverage [email or web address]

## 2021-01-21 NOTE — Plan of Care (Signed)

## 2021-01-21 NOTE — Progress Notes (Signed)
ANTICOAGULATION CONSULT NOTE - Initial Consult  Pharmacy Consult for heparin Indication: apical thrombus  Allergies  Allergen Reactions   Sulfa Antibiotics Itching   Erythromycin Hives   Sulfamethoxazole-Trimethoprim Hives   Tramadol Rash and Other (See Comments)    Urinary retention      Patient Measurements: Height: 5' 10"  (177.8 cm) Weight: 98.4 kg (217 lb) IBW/kg (Calculated) : 68.5 HEPARIN DW (KG): 89.9   Vital Signs: Temp: 98.8 F (37.1 C) (10/08 1537) Temp Source: Oral (10/08 1537) BP: 98/74 (10/08 1537) Pulse Rate: 100 (10/08 1537)  Labs: Recent Labs    01/20/21 1139 01/20/21 1333 01/21/21 0318  HGB 13.8  --  14.3  HCT 41.5  --  42.1  PLT 349  --  349  CREATININE 0.64  --  0.69  TROPONINIHS 16 18*  --     Estimated Creatinine Clearance: 103.4 mL/min (by C-G formula based on SCr of 0.69 mg/dL).   Medical History: Past Medical History:  Diagnosis Date   Anxiety 01/20/2021   Shortness of breath 01/20/2021   Tobacco abuse 01/20/2021    Medications:  Medications Prior to Admission  Medication Sig Dispense Refill Last Dose   ALPRAZolam (XANAX) 1 MG tablet Take 1-2 mg by mouth in the morning, at noon, in the evening, and at bedtime.   01/20/2021   amphetamine-dextroamphetamine (ADDERALL) 30 MG tablet Take 1 tablet by mouth 2 (two) times daily.   01/19/2021   cyclobenzaprine (FLEXERIL) 10 MG tablet Take 2 tablets by mouth at bedtime.   01/19/2021   guaiFENesin (MUCINEX) 600 MG 12 hr tablet Take 600 mg by mouth 2 (two) times daily as needed for cough.   Past Week   oxymetazoline ([employer] NASAL SPRAY) 0.05 % nasal spray Place 2 sprays into both nostrils 2 (two) times daily as needed for congestion.   01/19/2021   venlafaxine (EFFEXOR) 75 MG tablet Take 75 mg by mouth 3 (three) times daily with meals.   01/20/2021   zolpidem (AMBIEN) 5 MG tablet Take 2.5 mg by mouth at bedtime.   01/19/2021   Scheduled:   amphetamine-dextroamphetamine  30 mg Oral BID    cyclobenzaprine  20 mg Oral QHS   enoxaparin (LOVENOX) injection  40 mg Subcutaneous Q24H   guaiFENesin  1,200 mg Oral BID   lidocaine  1 patch Transdermal Q24H   metoprolol succinate  12.5 mg Oral Daily   nicotine  21 mg Transdermal Daily   potassium chloride  40 mEq Oral BID   sodium chloride flush  3 mL Intravenous Q12H   venlafaxine XR  75 mg Oral Q breakfast   zolpidem  5 mg Oral QHS    Assessment: 55 yo female with heart failure and LV apical thrombus. Pharmacy consulted to dose heparin. She is not on anticoagulation PTA.  -hg= 14.3, plt= 136 -lovenox 19m sq given ~ 10pm on 10/7   Goal of Therapy:  Heparin level 0.3-0.7 units/ml Monitor platelets by anticoagulation protocol: Yes   Plan:  -Heparin bolus 5000 units IV followed by 1400 units/hr -Heparin level in 6 hours and daily wth CBC daily  AHildred Laser PharmD Clinical Pharmacist **Pharmacist phone directory can now be found on [email or web address] ([email or web address]).  Listed under [email or web address]

## 2021-01-21 NOTE — Progress Notes (Signed)
Progress Note  Patient Name: Alfonso Shackett Date of Encounter: 01/21/2021  [place] Cardiologist: None Dr. Skeet Latch  Subjective   Overall she is breathing better this morning.  She is feeling her heart rate elevated slightly.  On telemetry, her heart rates have been in the low 100s.  Inpatient Medications    Scheduled Meds:  amphetamine-dextroamphetamine  30 mg Oral BID   cyclobenzaprine  20 mg Oral QHS   enoxaparin (LOVENOX) injection  40 mg Subcutaneous Q24H   guaiFENesin  1,200 mg Oral BID   lidocaine  1 patch Transdermal Q24H   metoprolol succinate  12.5 mg Oral Daily   nicotine  21 mg Transdermal Daily   potassium chloride  40 mEq Oral BID   sodium chloride flush  3 mL Intravenous Q12H   venlafaxine XR  75 mg Oral Q breakfast   zolpidem  5 mg Oral QHS   Continuous Infusions:  sodium chloride     PRN Meds: sodium chloride, acetaminophen, ALPRAZolam, benzonatate, ipratropium-albuterol, ondansetron (ZOFRAN) IV, sodium chloride flush   Vital Signs    Vitals:   01/21/21 0200 01/21/21 0351 01/21/21 0732 01/21/21 1134  BP: 113/79 108/75 109/70 110/75  Pulse: (!) 105 (!) 101 100 98  Resp:  20    Temp: 98 F (36.7 C) 98.5 F (36.9 C) 99 F (37.2 C)   TempSrc: Oral Oral Oral   SpO2: 95% 96% 93% 96%  Weight:  98.4 kg    Height:        Intake/Output Summary (Last 24 hours) at 01/21/2021 1207 Last data filed at 01/21/2021 0915 Gross per 24 hour  Intake 254.96 ml  Output 1400 ml  Net -1145.04 ml   Last 3 Weights 01/21/2021 01/20/2021  Weight (lbs) 217 lb 220 lb  Weight (kg) 98.431 kg 99.791 kg      Telemetry    Sinus tachycardia low 100s- Personally Reviewed  ECG    Sinus tachycardia no adverse changes- Personally Reviewed  Physical Exam   GEN: No acute distress.  Neck: No JVD Cardiac: Regular rate and rhythm mildly tachycardic, no murmurs, rubs, or gallops.  Respiratory: Clear to auscultation bilaterally. GI: Soft, nontender,  non-distended  MS: No edema; No deformity. Neuro:  Nonfocal  Psych: Normal affect   Labs    High Sensitivity Troponin:   Recent Labs  Lab 01/20/21 1139 01/20/21 1333  TROPONINIHS 16 18*     Chemistry Recent Labs  Lab 01/20/21 1139 01/21/21 0318 01/21/21 0926  NA 139 136  --   K 3.8 3.4*  --   CL 109 106  --   CO2 21* 22  --   GLUCOSE 109* 117*  --   BUN 13 9  --   CREATININE 0.64 0.69  --   CALCIUM 9.4 8.9  --   MG  --   --  1.7  PROT  --   --  6.6  ALBUMIN  --   --  3.5  AST  --   --  18  ALT  --   --  14  ALKPHOS  --   --  72  BILITOT  --   --  1.1  GFRNONAA >60 >60  --   ANIONGAP 9 8  --     Lipids No results for input(s): CHOL, TRIG, HDL, LABVLDL, LDLCALC, CHOLHDL in the last 168 hours.  Hematology Recent Labs  Lab 01/20/21 1139 01/21/21 0318  WBC 11.7* 11.7*  RBC 4.78 4.85  HGB 13.8 14.3  HCT 41.5 42.1  MCV 86.8 86.8  MCH 28.9 29.5  MCHC 33.3 34.0  RDW 13.5 13.4  PLT 349 349   Thyroid  Recent Labs  Lab 01/20/21 1814  TSH 1.876  FREET4 1.02    BNP Recent Labs  Lab 01/20/21 1139  BNP 658.1*    DDimer  Recent Labs  Lab 01/20/21 1126  DDIMER 1.26*     Radiology    DG Chest 2 View  Result Date: 01/20/2021 CLINICAL DATA:  Shortness of breath. EXAM: CHEST - 2 VIEW COMPARISON:  Chest radiograph 05/25/2005 FINDINGS: Patchy airspace opacity at the posterior costophrenic angle. No definite pleural effusion. No pneumothorax. Heart is normal in size. Visualized skeletal structures are unremarkable. Surgical clips in the upper abdomen. IMPRESSION: Patchy airspace opacity in the posterior costophrenic angle may represent pneumonia in the appropriate clinical context. Electronically Signed   By: Ileana Roup M.D.   On: 01/20/2021 12:28   CT Angio Chest PE W and/or Wo Contrast  Result Date: 01/20/2021 CLINICAL DATA:  Intermittent shortness of breath for 6 days. Question pulmonary embolism. EXAM: CT ANGIOGRAPHY CHEST WITH CONTRAST TECHNIQUE:  Multidetector CT imaging of the chest was performed using the standard protocol during bolus administration of intravenous contrast. Multiplanar CT image reconstructions and MIPs were obtained to evaluate the vascular anatomy. CONTRAST:  34m OMNIPAQUE IOHEXOL 350 MG/ML SOLN COMPARISON:  Radiographs 01/20/2021 and 05/25/2005. FINDINGS: Cardiovascular: The pulmonary arteries are well opacified with contrast to the level of the subsegmental branches. There is no evidence of acute pulmonary embolism. Subsegmental assessment mildly limited by breathing artifact. No significant systemic arterial abnormalities are identified, although there is limited opacification of the aorta. Minimal aortic and coronary artery atherosclerosis. The heart size is normal. There is no pericardial effusion. Mediastinum/Nodes: There are no enlarged mediastinal, hilar or axillary lymph nodes. The thyroid gland, trachea and esophagus demonstrate no significant findings. Lungs/Pleura: There are small bilateral pleural effusions. There is diffuse central airway and interstitial thickening throughout both lungs with patchy ground-glass opacities. There are nodular ground-glass components in both upper lobes, most notably on the right on image 30/7. No confluent airspace opacity. Upper abdomen: The visualized upper abdomen appears unremarkable status post cholecystectomy. Musculoskeletal/Chest wall: There is no chest wall mass or suspicious osseous finding. Mild degenerative changes in the spine. Review of the MIP images confirms the above findings. IMPRESSION: 1. No evidence of acute pulmonary embolism or other acute vascular findings in the chest. 2. Patchy ground-glass opacities in both lungs with nodular components in the upper lobes, diffuse central airway and interstitial thickening and small bilateral pleural effusions. Findings are nonspecific, but suspicious for viral infection (consider COVID-19 infection). Recommend radiographic  follow-up. 3. Mild Aortic Atherosclerosis (ICD10-I70.0). Electronically Signed   By: WRichardean SaleM.D.   On: 01/20/2021 14:23   DG CHEST PORT 1 VIEW  Result Date: 01/21/2021 CLINICAL DATA:  Shortness of breath EXAM: PORTABLE CHEST 1 VIEW COMPARISON:  January 20, 2021 FINDINGS: The heart size and mediastinal contours are stable. Both lungs are clear. Minimal linear atelectasis of both lung bases are identified. The visualized skeletal structures are unremarkable. IMPRESSION: Minimal linear atelectasis of both lung bases. Electronically Signed   By: WAbelardo DieselM.D.   On: 01/21/2021 11:02    Cardiac Studies   Awaiting echocardiogram.  She was told it might get done today at around 2 PM apparently.  Patient Profile     55y.o. female here with acute heart failure, manifested with shortness of breath,  sinus tachycardia, atypical chest discomfort.  Assessment & Plan    Acute heart failure - Awaiting echocardiogram.  With her sinus tachycardia, could she have a systolic dysfunction underlying.  We shall see. -Good overall diuresis with 20 mg IV administered yesterday evening. -Currently on metoprolol succinate 12.5 mg low-dose daily.  Also noted that she is on Adderall which can precipitate tachycardia.  She is also getting potassium supplementation 40 mEq twice daily. -Troponin 16 and 18, essentially normal. -CT scan of chest no PE.  Minimal coronary calcification noted. -TSH and free T4 are normal with regards to her tachycardia.  If echocardiogram reassuring, she should be able to be discharged.  She may follow-up with Dr. Oval Linsey at [facility].  It would not be unreasonable for her to continue with metoprolol as an outpatient to assist with her tachycardia.  Her pulse was 101 back in May in the emergency department during a episode for muscle spasm.  A year ago her pulse was 106 in the office setting in February 2021.  Her tachycardia is not new.  Once again, could be related to her  Adderall.   For questions or updates, please contact [place] Please consult [email or web address] for contact info under        Signed, Candee Furbish, MD  01/21/2021, 12:07 PM

## 2021-01-22 ENCOUNTER — Inpatient Hospital Stay (HOSPITAL_COMMUNITY): Payer: Medicaid Other

## 2021-01-22 DIAGNOSIS — R0789 Other chest pain: Secondary | ICD-10-CM | POA: Diagnosis not present

## 2021-01-22 DIAGNOSIS — I5021 Acute systolic (congestive) heart failure: Principal | ICD-10-CM

## 2021-01-22 DIAGNOSIS — F419 Anxiety disorder, unspecified: Secondary | ICD-10-CM | POA: Diagnosis not present

## 2021-01-22 DIAGNOSIS — I509 Heart failure, unspecified: Secondary | ICD-10-CM | POA: Diagnosis not present

## 2021-01-22 DIAGNOSIS — R0602 Shortness of breath: Secondary | ICD-10-CM | POA: Diagnosis not present

## 2021-01-22 LAB — COMPREHENSIVE METABOLIC PANEL
ALT: 11 U/L (ref 0–44)
AST: 18 U/L (ref 15–41)
Albumin: 3.3 g/dL — ABNORMAL LOW (ref 3.5–5.0)
Alkaline Phosphatase: 68 U/L (ref 38–126)
Anion gap: 10 (ref 5–15)
BUN: 14 mg/dL (ref 6–20)
CO2: 20 mmol/L — ABNORMAL LOW (ref 22–32)
Calcium: 8.7 mg/dL — ABNORMAL LOW (ref 8.9–10.3)
Chloride: 107 mmol/L (ref 98–111)
Creatinine, Ser: 0.69 mg/dL (ref 0.44–1.00)
GFR, Estimated: >60 mL/min (ref >60)
Glucose, Bld: 109 mg/dL — ABNORMAL HIGH (ref 70–99)
Potassium: 4.1 mmol/L (ref 3.5–5.1)
Sodium: 137 mmol/L (ref 135–145)
Total Bilirubin: 0.5 mg/dL (ref 0.3–1.2)
Total Protein: 6.1 g/dL — ABNORMAL LOW (ref 6.5–8.1)

## 2021-01-22 LAB — PROCALCITONIN: Procalcitonin: <0.1 ng/mL

## 2021-01-22 LAB — CBC WITH DIFFERENTIAL/PLATELET
Abs Immature Granulocytes: 0.02 10*3/uL (ref 0.00–0.07)
Basophils Absolute: 0.1 10*3/uL (ref 0.0–0.1)
Basophils Relative: 1 %
Eosinophils Absolute: 0.3 10*3/uL (ref 0.0–0.5)
Eosinophils Relative: 3 %
HCT: 40.8 % (ref 36.0–46.0)
Hemoglobin: 13.5 g/dL (ref 12.0–15.0)
Immature Granulocytes: 0 %
Lymphocytes Relative: 33 %
Lymphs Abs: 3.3 10*3/uL (ref 0.7–4.0)
MCH: 29.1 pg (ref 26.0–34.0)
MCHC: 33.1 g/dL (ref 30.0–36.0)
MCV: 87.9 fL (ref 80.0–100.0)
Monocytes Absolute: 0.6 10*3/uL (ref 0.1–1.0)
Monocytes Relative: 6 %
Neutro Abs: 5.8 10*3/uL (ref 1.7–7.7)
Neutrophils Relative %: 57 %
Platelets: 291 10*3/uL (ref 150–400)
RBC: 4.64 MIL/uL (ref 3.87–5.11)
RDW: 13.3 % (ref 11.5–15.5)
WBC: 10 10*3/uL (ref 4.0–10.5)
nRBC: 0 % (ref 0.0–0.2)

## 2021-01-22 LAB — HEPARIN LEVEL (UNFRACTIONATED)
Heparin Unfractionated: 0.51 IU/mL (ref 0.30–0.70)
Heparin Unfractionated: 0.43 IU/mL (ref 0.30–0.70)

## 2021-01-22 LAB — PHOSPHORUS: Phosphorus: 3.1 mg/dL (ref 2.5–4.6)

## 2021-01-22 LAB — MAGNESIUM: Magnesium: 1.7 mg/dL (ref 1.7–2.4)

## 2021-01-22 MED ORDER — ASPIRIN 81 MG PO CHEW
81.0000 mg | CHEWABLE_TABLET | ORAL | Status: AC
  Administered 2021-01-23: 81 mg via ORAL
  Filled 2021-01-22: qty 1

## 2021-01-22 MED ORDER — MAGNESIUM SULFATE 2 GM/50ML IV SOLN
2.0000 g | Freq: Once | INTRAVENOUS | Status: AC
  Administered 2021-01-22: 2 g via INTRAVENOUS
  Filled 2021-01-22: qty 50

## 2021-01-22 MED ORDER — METOPROLOL SUCCINATE ER 25 MG PO TB24
12.5000 mg | ORAL_TABLET | Freq: Two times a day (BID) | ORAL | Status: DC
  Filled 2021-01-22: qty 1

## 2021-01-22 MED ORDER — FUROSEMIDE 10 MG/ML IJ SOLN
20.0000 mg | Freq: Once | INTRAMUSCULAR | Status: AC
  Administered 2021-01-22: 20 mg via INTRAVENOUS
  Filled 2021-01-22: qty 2

## 2021-01-22 MED ORDER — SODIUM CHLORIDE 0.9% FLUSH: 3.0000 mL | Freq: Two times a day (BID) | INTRAVENOUS | Status: DC

## 2021-01-22 MED ORDER — SODIUM CHLORIDE 0.9% FLUSH
3.0000 mL | INTRAVENOUS | Status: DC | PRN
Start: 2021-01-22 — End: 2021-01-23

## 2021-01-22 MED ORDER — OXYCODONE HCL 5 MG PO TABS
5.0000 mg | ORAL_TABLET | Freq: Four times a day (QID) | ORAL | Status: DC | PRN
Start: 2021-01-22 — End: 2021-01-23
  Administered 2021-01-22 – 2021-01-23 (×4): 5 mg via ORAL
  Filled 2021-01-22 (×4): qty 1

## 2021-01-22 MED ORDER — POTASSIUM CHLORIDE CRYS ER 20 MEQ PO TBCR
40.0000 meq | EXTENDED_RELEASE_TABLET | Freq: Once | ORAL | Status: AC
  Administered 2021-01-22: 40 meq via ORAL
  Filled 2021-01-22: qty 2

## 2021-01-22 MED ORDER — SODIUM CHLORIDE 0.9 % IV SOLN: INTRAVENOUS | Status: DC

## 2021-01-22 MED ORDER — SODIUM CHLORIDE 0.9 % IV SOLN: 250.0000 mL | INTRAVENOUS | Status: DC | PRN

## 2021-01-22 NOTE — Progress Notes (Addendum)
Progress Note  Patient Name: Christine [name] Date of Encounter: 01/22/2021  [facility] Cardiologist: Skeet Latch, MD   Subjective   No CP or SOB Questions about the cath, answered Had Covid early August, that is the only illness, no stressful events   Inpatient Medications    Scheduled Meds:  amphetamine-dextroamphetamine  30 mg Oral BID   cyclobenzaprine  20 mg Oral QHS   guaiFENesin  1,200 mg Oral BID   lidocaine  1 patch Transdermal Q24H   metoprolol succinate  12.5 mg Oral Daily   nicotine  21 mg Transdermal Daily   sodium chloride flush  3 mL Intravenous Q12H   venlafaxine XR  75 mg Oral Q breakfast   zolpidem  5 mg Oral QHS   Continuous Infusions:  sodium chloride     heparin 1,550 Units/hr (01/22/21 0406)   magnesium sulfate bolus IVPB     PRN Meds: sodium chloride, acetaminophen, ALPRAZolam, benzonatate, ipratropium-albuterol, ondansetron (ZOFRAN) IV, sodium chloride flush   Vital Signs    Vitals:   01/22/21 0104 01/22/21 0108 01/22/21 0347 01/22/21 0420  BP:  102/73 106/79   Pulse:  (!) 101 100   Resp:  17 17   Temp:  (!) 97.5 F (36.4 C) (!) 97.5 F (36.4 C)   TempSrc:  Oral Oral   SpO2:  97% 96% 98%  Weight: 99.2 kg     Height:        Intake/Output Summary (Last 24 hours) at 01/22/2021 0935 Last data filed at 01/22/2021 0900 Gross per 24 hour  Intake 844.51 ml  Output 500 ml  Net 344.51 ml   Last 3 Weights 01/22/2021 01/21/2021 01/20/2021  Weight (lbs) 218 lb 12.8 oz 217 lb 220 lb  Weight (kg) 99.247 kg 98.431 kg 99.791 kg      Telemetry    ST, no sig ectopy- Personally Reviewed  ECG    None today- Personally Reviewed  Physical Exam   General: Well developed, well nourished, female in no acute distress Head: Eyes PERRLA, Head normocephalic and atraumatic Lungs: few rales L, dense rales R to auscultation. Heart: HRRR S1 S2, without rub or gallop. No murmur. 4/4 extremity pulses are 2+ & equal.  JVD 9 cm Abdomen: Bowel sounds  are present, abdomen soft and non-tender without masses or  hernias noted. Msk: Normal strength and tone for age. Extremities: No clubbing, cyanosis or edema.    Skin:  No rashes or lesions noted. Neuro: Alert and oriented X 3. Psych:  Good affect, responds appropriately  Labs    High Sensitivity Troponin:   Recent Labs  Lab 01/20/21 1139 01/20/21 1333  TROPONINIHS 16 18*     Chemistry Recent Labs  Lab 01/20/21 1139 01/21/21 0318 01/21/21 0926 01/22/21 0101  NA 139 136  --  137  K 3.8 3.4*  --  4.1  CL 109 106  --  107  CO2 21* 22  --  20*  GLUCOSE 109* 117*  --  109*  BUN 13 9  --  14  CREATININE 0.64 0.69  --  0.69  CALCIUM 9.4 8.9  --  8.7*  MG  --   --  1.7 1.7  PROT  --   --  6.6 6.1*  ALBUMIN  --   --  3.5 3.3*  AST  --   --  18 18  ALT  --   --  14 11  ALKPHOS  --   --  72 68  BILITOT  --   --  1.1 0.5  GFRNONAA >60 >60  --  >60  ANIONGAP 9 8  --  10    Lipids No results for input(s): CHOL, TRIG, HDL, LABVLDL, LDLCALC, CHOLHDL in the last 168 hours.  Hematology Recent Labs  Lab 01/20/21 1139 01/21/21 0318 01/22/21 0101  WBC 11.7* 11.7* 10.0  RBC 4.78 4.85 4.64  HGB 13.8 14.3 13.5  HCT 41.5 42.1 40.8  MCV 86.8 86.8 87.9  MCH 28.9 29.5 29.1  MCHC 33.3 34.0 33.1  RDW 13.5 13.4 13.3  PLT 349 349 291   Thyroid  Recent Labs  Lab 01/20/21 1814  TSH 1.876  FREET4 1.02    BNP Recent Labs  Lab 01/20/21 1139  BNP 658.1*    DDimer  Recent Labs  Lab 01/20/21 1126  DDIMER 1.26*     Radiology    DG Chest 2 View  Result Date: 01/20/2021 CLINICAL DATA:  Shortness of breath. EXAM: CHEST - 2 VIEW COMPARISON:  Chest radiograph 05/25/2005 FINDINGS: Patchy airspace opacity at the posterior costophrenic angle. No definite pleural effusion. No pneumothorax. Heart is normal in size. Visualized skeletal structures are unremarkable. Surgical clips in the upper abdomen. IMPRESSION: Patchy airspace opacity in the posterior costophrenic angle may represent  pneumonia in the appropriate clinical context. Electronically Signed   By: Ileana Roup M.D.   On: 01/20/2021 12:28   CT Angio Chest PE W and/or Wo Contrast  Result Date: 01/20/2021 CLINICAL DATA:  Intermittent shortness of breath for 6 days. Question pulmonary embolism. EXAM: CT ANGIOGRAPHY CHEST WITH CONTRAST TECHNIQUE: Multidetector CT imaging of the chest was performed using the standard protocol during bolus administration of intravenous contrast. Multiplanar CT image reconstructions and MIPs were obtained to evaluate the vascular anatomy. CONTRAST:  41m OMNIPAQUE IOHEXOL 350 MG/ML SOLN COMPARISON:  Radiographs 01/20/2021 and 05/25/2005. FINDINGS: Cardiovascular: The pulmonary arteries are well opacified with contrast to the level of the subsegmental branches. There is no evidence of acute pulmonary embolism. Subsegmental assessment mildly limited by breathing artifact. No significant systemic arterial abnormalities are identified, although there is limited opacification of the aorta. Minimal aortic and coronary artery atherosclerosis. The heart size is normal. There is no pericardial effusion. Mediastinum/Nodes: There are no enlarged mediastinal, hilar or axillary lymph nodes. The thyroid gland, trachea and esophagus demonstrate no significant findings. Lungs/Pleura: There are small bilateral pleural effusions. There is diffuse central airway and interstitial thickening throughout both lungs with patchy ground-glass opacities. There are nodular ground-glass components in both upper lobes, most notably on the right on image 30/7. No confluent airspace opacity. Upper abdomen: The visualized upper abdomen appears unremarkable status post cholecystectomy. Musculoskeletal/Chest wall: There is no chest wall mass or suspicious osseous finding. Mild degenerative changes in the spine. Review of the MIP images confirms the above findings. IMPRESSION: 1. No evidence of acute pulmonary embolism or other acute  vascular findings in the chest. 2. Patchy ground-glass opacities in both lungs with nodular components in the upper lobes, diffuse central airway and interstitial thickening and small bilateral pleural effusions. Findings are nonspecific, but suspicious for viral infection (consider COVID-19 infection). Recommend radiographic follow-up. 3. Mild Aortic Atherosclerosis (ICD10-I70.0). Electronically Signed   By: WRichardean SaleM.D.   On: 01/20/2021 14:23   DG CHEST PORT 1 VIEW  Result Date: 01/21/2021 CLINICAL DATA:  Shortness of breath EXAM: PORTABLE CHEST 1 VIEW COMPARISON:  January 20, 2021 FINDINGS: The heart size and mediastinal contours are stable. Both lungs are clear. Minimal linear atelectasis of both lung bases are identified.  The visualized skeletal structures are unremarkable. IMPRESSION: Minimal linear atelectasis of both lung bases. Electronically Signed   By: Abelardo Diesel M.D.   On: 01/21/2021 11:02   ECHOCARDIOGRAM COMPLETE  Result Date: 01/21/2021    ECHOCARDIOGRAM REPORT   Patient Name:   JANIYA MILLIRONS Date of Exam: 01/21/2021 Medical Rec #:  [id number]       Height:       70.0 in Accession #:    [id number]      Weight:       217.0 lb Date of Birth:  [birth year]       BSA:          2.161 m Patient Age:    82 years        BP:           109/70 mmHg Patient Gender: F               HR:           99 bpm. Exam Location:  Inpatient Procedure: 2D Echo, Cardiac Doppler, Color Doppler and Intracardiac            Opacification Agent  Discussed results with Dr Alfredia Ferguson at 16:05 on 01/21/21. Indications:    CHF-Acute Systolic O83.25  History:        Patient has no prior history of Echocardiogram examinations.                 Signs/Symptoms:Shortness of Breath.  Sonographer:    Bernadene Person RDCS Referring Phys: [id number]  1. Left ventricular ejection fraction, by estimation, is 20 to 25%. The left ventricle has severely decreased function. The left ventricle demonstrates global  hypokinesis. The left ventricular internal cavity size was severely dilated. Left ventricular diastolic parameters are indeterminate.  2. LV apical mural thrombus (best seen images 109-110)  3. Right ventricular systolic function is normal. The right ventricular size is normal. There is normal pulmonary artery systolic pressure.  4. The mitral valve is abnormal. Moderate mitral valve regurgitation. Appears functional.  5. The aortic valve was not well visualized. Aortic valve regurgitation is trivial. No aortic stenosis is present.  6. Aortic dilatation noted. There is mild dilatation of the ascending aorta, measuring 37 mm.  7. The inferior vena cava is normal in size with <50% respiratory variability, suggesting right atrial pressure of 8 mmHg. FINDINGS  Left Ventricle: Left ventricular ejection fraction, by estimation, is 20 to 25%. The left ventricle has severely decreased function. The left ventricle demonstrates global hypokinesis. Definity contrast agent was given IV to delineate the left ventricular endocardial borders. The left ventricular internal cavity size was severely dilated. There is no left ventricular hypertrophy. Left ventricular diastolic parameters are indeterminate. Right Ventricle: The right ventricular size is normal. No increase in right ventricular wall thickness. Right ventricular systolic function is normal. There is normal pulmonary artery systolic pressure. The tricuspid regurgitant velocity is 1.80 m/s, and  with an assumed right atrial pressure of 8 mmHg, the estimated right ventricular systolic pressure is 58.3 mmHg. Left Atrium: Left atrial size was normal in size. Right Atrium: Right atrial size was normal in size. Pericardium: There is no evidence of pericardial effusion. Mitral Valve: The mitral valve is abnormal. Moderate mitral valve regurgitation. Tricuspid Valve: The tricuspid valve is normal in structure. Tricuspid valve regurgitation is trivial. Aortic Valve: The aortic  valve was not well visualized. Aortic valve regurgitation is trivial. No aortic stenosis is present. Pulmonic Valve: The pulmonic valve  was grossly normal. Pulmonic valve regurgitation is not visualized. Aorta: The aortic root is normal in size and structure and aortic dilatation noted. There is mild dilatation of the ascending aorta, measuring 37 mm. Venous: The inferior vena cava is normal in size with less than 50% respiratory variability, suggesting right atrial pressure of 8 mmHg. IAS/Shunts: The interatrial septum was not well visualized.  LEFT VENTRICLE PLAX 2D LVIDd:         6.40 cm      Diastology LVIDs:         5.80 cm      LV e' medial:    7.20 cm/s LV PW:         0.90 cm      LV E/e' medial:  24.0 LV IVS:        0.90 cm      LV e' lateral:   10.60 cm/s LVOT diam:     2.10 cm      LV E/e' lateral: 16.3 LV SV:         43 LV SV Index:   20 LVOT Area:     3.46 cm  LV Volumes (MOD) LV vol d, MOD A2C: 272.0 ml LV vol d, MOD A4C: 211.0 ml LV vol s, MOD A2C: 202.0 ml LV vol s, MOD A4C: 166.0 ml LV SV MOD A2C:     70.0 ml LV SV MOD A4C:     211.0 ml LV SV MOD BP:      56.2 ml RIGHT VENTRICLE RV S prime:     9.20 cm/s TAPSE (M-mode): 1.5 cm LEFT ATRIUM             Index        RIGHT ATRIUM           Index LA diam:        3.10 cm 1.43 cm/m   RA Area:     12.30 cm LA Vol (A2C):   66.5 ml 30.77 ml/m  RA Volume:   28.40 ml  13.14 ml/m LA Vol (A4C):   53.1 ml 24.57 ml/m LA Biplane Vol: 62.3 ml 28.83 ml/m  AORTIC VALVE LVOT Vmax:   88.83 cm/s LVOT Vmean:  56.267 cm/s LVOT VTI:    0.123 m  AORTA Ao Root diam: 3.60 cm Ao Asc diam:  3.70 cm MITRAL VALVE                  TRICUSPID VALVE MV Area (PHT): 6.96 cm       TR Peak grad:   13.0 mmHg MV Decel Time: 109 msec       TR Vmax:        180.00 cm/s MR Peak grad:    76.7 mmHg MR Mean grad:    49.0 mmHg    SHUNTS MR Vmax:         438.00 cm/s  Systemic VTI:  0.12 m MR Vmean:        326.5 cm/s   Systemic Diam: 2.10 cm MR PISA:         2.26 cm MR PISA Eff ROA: 20 mm MR  PISA Radius:  0.60 cm MV E velocity: 173.00 cm/s MV A velocity: 76.50 cm/s MV E/A ratio:  2.26 Oswaldo Milian MD Electronically signed by Oswaldo Milian MD Signature Date/Time: 01/21/2021/4:09:31 PM    Final     Cardiac Studies   ECHO: 01/21/2021  1. Left ventricular ejection fraction, by estimation, is 20 to 25%. The  left  ventricle has severely decreased function. The left ventricle  demonstrates global hypokinesis. The left ventricular internal cavity size  was severely dilated. Left ventricular diastolic parameters are indeterminate.   2. LV apical mural thrombus (best seen images 109-110)   3. Right ventricular systolic function is normal. The right ventricular  size is normal. There is normal pulmonary artery systolic pressure.   4. The mitral valve is abnormal. Moderate mitral valve regurgitation.  Appears functional.   5. The aortic valve was not well visualized. Aortic valve regurgitation  is trivial. No aortic stenosis is present.   6. Aortic dilatation noted. There is mild dilatation of the ascending  aorta, measuring 37 mm.   7. The inferior vena cava is normal in size with <50% respiratory  variability, suggesting right atrial pressure of 8 mmHg.   Patient Profile     55 y.o. female here with acute heart failure, manifested with shortness of breath, sinus tachycardia, atypical chest discomfort.  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 Agree with above.  55 year old with newly  discovered cardiomyopathy.  Interestingly, her father also had severe reduction in ejection fraction, 15%.  I talked to her brother in the room who was a 35-year paramedic in [employer], started out in [place].  He stated that his father did not have bypass or "blockages ".  He did have "white clot "in his leg after use of heparin.  Their father was perhaps one of the first people to receive streptokinase in the county.  In all, we may be dealing with a familial cardiomyopathy if coronary artery disease is excluded.  She denies any thyroid issues, no rheumatologic issues, no chest pain.  Overall other than this episode of pulmonary edema that prompted her emergency department, she has been doing fairly well NYHA class I-II.    We will proceed with right and left heart catheterization tomorrow.  Risk and benefits have been discussed including stroke heart attack death renal impairment bleeding.  Willing to proceed.  As far as goal-directed medical therapy, blood pressure will be limiting.  We may be able to place her on SGLT2 inhibitor as well as low-dose spironolactone in combination with her low-dose Toprol tomorrow after cardiac catheterization.  I am hesitant about utilization of Entresto given her low blood pressure.  We shall see.  I also think that it would be beneficial tomorrow to have the advanced heart failure team consult on her given this new diagnosis.  Tomorrow post catheterization, we can go ahead and start warfarin as well given her left ventricular mural thrombus in the apex.  Appreciate echo assistance from Dr. Gardiner Rhyme.  I think it would also be beneficial after catheterization perhaps on Tuesday to proceed with cardiac MRI to not only confirm the thrombus but to also exclude other etiologies for her cardiomyopathy.  Spent over 35 minutes answering questions review of records.  Candee Furbish,  MD

## 2021-01-22 NOTE — Progress Notes (Signed)
ANTICOAGULATION CONSULT NOTE   Pharmacy Consult for heparin Indication: apical thrombus  Allergies  Allergen Reactions   Sulfa Antibiotics Itching   Erythromycin Hives   Sulfamethoxazole-Trimethoprim Hives   Tramadol Rash and Other (See Comments)    Urinary retention      Patient Measurements: Height: 5' 10"  (177.8 cm) Weight: 98.4 kg (217 lb) IBW/kg (Calculated) : 68.5 HEPARIN DW (KG): 89.9   Vital Signs: Temp: 98.8 F (37.1 C) (10/08 1935) Temp Source: Oral (10/08 1935) BP: 105/71 (10/08 1935) Pulse Rate: 104 (10/08 1935)  Labs: Recent Labs    01/20/21 1139 01/20/21 1333 01/21/21 0318 01/21/21 2310  HGB 13.8  --  14.3  --   HCT 41.5  --  42.1  --   PLT 349  --  349  --   HEPARINUNFRC  --   --   --  0.27*  CREATININE 0.64  --  0.69  --   TROPONINIHS 16 18*  --   --      Estimated Creatinine Clearance: 103.4 mL/min (by C-G formula based on SCr of 0.69 mg/dL).   Medical History: Past Medical History:  Diagnosis Date   Anxiety 01/20/2021   Shortness of breath 01/20/2021   Tobacco abuse 01/20/2021    Medications:  Medications Prior to Admission  Medication Sig Dispense Refill Last Dose   ALPRAZolam (XANAX) 1 MG tablet Take 1-2 mg by mouth in the morning, at noon, in the evening, and at bedtime.   01/20/2021   amphetamine-dextroamphetamine (ADDERALL) 30 MG tablet Take 1 tablet by mouth 2 (two) times daily.   01/19/2021   cyclobenzaprine (FLEXERIL) 10 MG tablet Take 2 tablets by mouth at bedtime.   01/19/2021   guaiFENesin (MUCINEX) 600 MG 12 hr tablet Take 600 mg by mouth 2 (two) times daily as needed for cough.   Past Week   oxymetazoline ([employer] NASAL SPRAY) 0.05 % nasal spray Place 2 sprays into both nostrils 2 (two) times daily as needed for congestion.   01/19/2021   venlafaxine (EFFEXOR) 75 MG tablet Take 75 mg by mouth 3 (three) times daily with meals.   01/20/2021   zolpidem (AMBIEN) 5 MG tablet Take 2.5 mg by mouth at bedtime.   01/19/2021   Scheduled:    amphetamine-dextroamphetamine  30 mg Oral BID   cyclobenzaprine  20 mg Oral QHS   guaiFENesin  1,200 mg Oral BID   lidocaine  1 patch Transdermal Q24H   metoprolol succinate  12.5 mg Oral Daily   nicotine  21 mg Transdermal Daily   sodium chloride flush  3 mL Intravenous Q12H   venlafaxine XR  75 mg Oral Q breakfast   zolpidem  5 mg Oral QHS    Assessment: 55 yo female with heart failure and LV apical thrombus. Pharmacy consulted to dose heparin. She is not on anticoagulation PTA.  -hg= 14.3, plt= 136 -lovenox 63m sq given ~ 10pm on 10/7  10/9 AM update:  Heparin level just below goal  Goal of Therapy:  Heparin level 0.3-0.7 units/ml Monitor platelets by anticoagulation protocol: Yes   Plan:  -Inc heparin to 1550 units/hr -0800 heparin level  JNarda Bonds PharmD, BCPS Clinical Pharmacist Phone: [phone]

## 2021-01-22 NOTE — Progress Notes (Signed)
PROGRESS NOTE    Christine [name]  TLX:[id number] DOB: [birth year] DOA: 01/20/2021 PCP: Pcp, No   Brief Narrative: The patient is a 55 year old obese female with a past medical history significant for but not limited to anxiety, depression, ADHD, history of chronic tobacco abuse and smoking who presented with a new onset of cough and shortness of breath.  She states that symptoms started last Sunday when she started to develop a dry cough and started to feel short of breath and tightness in her chest as well as "all around her rib cage like a belt around her chest and I could not take a deep breath".  She denied any fevers or chills that she had bronchitis and did not use any over-the-counter medications.  When asked few days her symptoms became worse with minimal activity triggering shortness of breath and cough.  She also states that she felt palpitations yesterday.  At baseline she had severe anxiety and needed to take Xanax 1 mg every 6-8 hours otherwise she would feel severe palpitations.  She already had COVID-19 infection in July 2022 and reports that she has had frequent night sweats after but no weight loss or diarrhea.  She went to urgent care because of her severe shortness of breath and palpitations in urgent care suspected new onset CHF and sent the patient to the ED.  She states that she was not able to smoke her cigarettes due to her shortness of breath.  In the ED she had an EKG done which showed sinus tachycardia.  She had CT of the chest which was negative for PE but did show bilateral peripheral infiltrates suspicious for viral pneumonia versus CHF.  She also had mild bilateral pleural effusions and CBC in BMP were largely within normal limits but BNP was elevated. Further workup was done and ECHOCardiogram done and showed an EF of 20-25%. Cardiology to give her another dose of Lasix (received 20 mg initially) and planning for Cardiac Catheterization. Cardiology to also increase BB dosing to  BID. Currently on a Heparin gtt for Mural LV Apical Thrombus found on ECHO.  Will repeat CXR given diminished breath sounds on the Right and add Oxycodone for Back Pain.    Assessment & Plan:   Active Problems:   CHF (congestive heart failure) (HCC)   Anxiety   Tobacco abuse   Shortness of breath   Chest tightness  New onset Acute Systolic CHF with EF of 74-16% -Clinically the patient appeared to have mild fluid overload with bilateral crackles -She had an elevated D-Dimer of 1.26 so obtained CTA of the chest which showed "No evidence of acute pulmonary embolism or other acute vascular findings in the chest. Patchy ground-glass opacities in both lungs with nodular components in the upper lobes, diffuse central airway and interstitial thickening and small bilateral pleural effusions. Findings are nonspecific, but suspicious for viral infection (consider COVID-19 infection). Recommend radiographic follow-up. Mild Aortic Atherosclerosis" -Repeat CXR given diminished breath sounds and Dyspnea this AM  -BNP elevated at 658.1 on admission; Today has some JVD and Some rales and Crackles noted  -She is given a dose of IV Lasix 20 mg the day before yesterday and will be giving her IV Lasix 20 mg x1 today  -Cardiology was consulted and appreciate further evaluation and reccs -Echocardiogram done and showed EF of 20-25% with severely decreased LV Fxn and the Left Ventricle demonstarted global hypokinesis and the Left Ventricle Internal Cavity was severely dilated with the LV Diastolic parameters being indeterminate.  She also had an LV Apical Mural Thrombus and moderate mitral valve regurgitation -We will need strict I's and O's and daily weights; She is -800.5 mL  -She feels as if she has some Urinary Retention so will need to Monitor Bladder Scans q8h and In and Out Cath for >350 mL -She had a unclear etiology but will need to rule out thyroid disorders and will check a TSH, T4 and T3; TSH was 1.876 and  T4 was 1.02 and T3 still pending; Also checking HbA1c based on Risk factors -She may need an ischemic workup based on her Severely diminished EF -Started on Metoprolol Succinate 12.5 mg po Daily and Cardiology increasing dosing today to BID  -Other differential diagnosis could include COPD but no history of COPD in the past despite her smoking.  We will recommend outpatient pulmonary follow-up for formal PFT testing -Further care per Cardiology and plan is for Cardiac Cath  LV Apical Mural Thrombus -Seen on ECHO -Unclear Etiology -Started Anticoagulation with Heparin gtt  Chest Pain/Discomfort,  -Symptoms are Atypical  -Troponin went from 16 -> 18 -Cardiology recommending considering ischemic evaluation based on ECHO and she is getting a Cardiac Cath in the AM given her EF of 20-25% -CP also occurs when laying down so ? GERD -Started the patient on Metoprolol Succinate 12.5 mg po Daily and Cardiology to increase this today to BID dosing -May try some Nitro paste but defer to Cardiology to initiate given her lower BP -Repeat CXR this AM pending   Back Pain -Developed after her Lasix  -Improved with acetaminophen but then happened again and has been more constant -We will try lidocaine patch -CTA of the chest showed "No significant systemic arterial abnormalities are identified, although there is limited opacification of the aorta. Minimal aortic and coronary artery atherosclerosis. The heart size is normal. There is no pericardial effusion." As well as "There is no chest wall mass or suspicious osseous finding. Mild degenerative changes in the spine."  Sinus Tachycardia -No PE on Exam -TSH normal; Could be a result of her Amphetamine for her ADHD -Given 500 mL bolus on Admission and will hold further Fluid administration -HR ranging from 100-118 and now improved  -C/w low dose Metoprolol Succinate 12.5 mg po Daily   ? Viral PNA -CTA finidings as above -Patient had COVID infection  in July and tested Negative here -C/w Supportive Care and c/w Guaifenesin 1200 mg po BID and Benzonatate 200 mg po TIDprn Cough   -Also Added Flutter Valve and Incentive Spirometry  -C/w DuoNeb 3 mL q6hprn Wheezing and SOB -Repeat Imaging and follow for Radiographic Improvement; Obtaining CXR today  -SpO2: 98 % -She will an Ambulatory Home O2 screen prior to D/C  -Repeat CXR yesterday AM showed "The heart size and mediastinal contours are stable. Both lungs are clear. Minimal linear atelectasis of both lung bases are identified. The visualized skeletal structures are unremarkable." -Repeat CXR this AM pending   Hypokalemia -Mild. K+ went from 3.8 -> 3.4 -> 4.1 -Check Mag Level and was 1.7 so will replete with IV Mag Sulfate 2 grams -Continue to Monitor and Repelet as Necessary   Leukocytosis, improved   -WBC went from 11.7 -> 11.7 -> 10.0 -PCT went from <0.10 -> 0.45 -> <0.10 -Continue to Monitor for S/Sx of Infection -Repeat CBC in the AM   Anxiety and Depression -C/w Venlafaxine XR 75 mg po Daily and Alprazolam 1 mg po TIDprn Anxiety   ADHD -C/w Amphetamine-Dextroamphetamine 30 mg po  BID  Tobacco abuse and Cigarette Smoker -Smoking cessation counseling -Continue with Nicotine Patch 21 mg TD  Obesity -Complicates overall prognosis and care -Estimated body mass index is 31.39 kg/m as calculated from the following:   Height as of this encounter: 5' 10"  (1.778 m).   Weight as of this encounter: 99.2 kg. -Weight Loss and Dietary Counseling given   DVT prophylaxis: Enoxaparin 40 mg sq q24h changed to Heparin gtt given LV Apical Mural Thrombus  Code Status: FULL CODE  Family Communication: No family currently present at bedside Disposition Plan: We will need to have cardiac clearance prior to discharge  Status is: Inpatient  Remains inpatient appropriate because:Unsafe d/c plan, IV treatments appropriate due to intensity of illness or inability to take PO, and Inpatient level  of care appropriate due to severity of illness  Dispo: The patient is from: Home              Anticipated d/c is to: Home              Patient currently is not medically stable to d/c.   Difficult to place patient No  Consultants:  Cardiology   Procedures:  ECHOCARDIOGRAM IMPRESSIONS     1. Left ventricular ejection fraction, by estimation, is 20 to 25%. The  left ventricle has severely decreased function. The left ventricle  demonstrates global hypokinesis. The left ventricular internal cavity size  was severely dilated. Left ventricular  diastolic parameters are indeterminate.   2. LV apical mural thrombus (best seen images 109-110)   3. Right ventricular systolic function is normal. The right ventricular  size is normal. There is normal pulmonary artery systolic pressure.   4. The mitral valve is abnormal. Moderate mitral valve regurgitation.  Appears functional.   5. The aortic valve was not well visualized. Aortic valve regurgitation  is trivial. No aortic stenosis is present.   6. Aortic dilatation noted. There is mild dilatation of the ascending  aorta, measuring 37 mm.   7. The inferior vena cava is normal in size with <50% respiratory  variability, suggesting right atrial pressure of 8 mmHg.   FINDINGS   Left Ventricle: Left ventricular ejection fraction, by estimation, is 20  to 25%. The left ventricle has severely decreased function. The left  ventricle demonstrates global hypokinesis. Definity contrast agent was  given IV to delineate the left  ventricular endocardial borders. The left ventricular internal cavity size  was severely dilated. There is no left ventricular hypertrophy. Left  ventricular diastolic parameters are indeterminate.   Right Ventricle: The right ventricular size is normal. No increase in  right ventricular wall thickness. Right ventricular systolic function is  normal. There is normal pulmonary artery systolic pressure. The tricuspid   regurgitant velocity is 1.80 m/s, and   with an assumed right atrial pressure of 8 mmHg, the estimated right  ventricular systolic pressure is 84.1 mmHg.   Left Atrium: Left atrial size was normal in size.   Right Atrium: Right atrial size was normal in size.   Pericardium: There is no evidence of pericardial effusion.   Mitral Valve: The mitral valve is abnormal. Moderate mitral valve  regurgitation.   Tricuspid Valve: The tricuspid valve is normal in structure. Tricuspid  valve regurgitation is trivial.   Aortic Valve: The aortic valve was not well visualized. Aortic valve  regurgitation is trivial. No aortic stenosis is present.   Pulmonic Valve: The pulmonic valve was grossly normal. Pulmonic valve  regurgitation is not visualized.  Aorta: The aortic root is normal in size and structure and aortic  dilatation noted. There is mild dilatation of the ascending aorta,  measuring 37 mm.   Venous: The inferior vena cava is normal in size with less than 50%  respiratory variability, suggesting right atrial pressure of 8 mmHg.   IAS/Shunts: The interatrial septum was not well visualized.      LEFT VENTRICLE  PLAX 2D  LVIDd:         6.40 cm      Diastology  LVIDs:         5.80 cm      LV e' medial:    7.20 cm/s  LV PW:         0.90 cm      LV E/e' medial:  24.0  LV IVS:        0.90 cm      LV e' lateral:   10.60 cm/s  LVOT diam:     2.10 cm      LV E/e' lateral: 16.3  LV SV:         43  LV SV Index:   20  LVOT Area:     3.46 cm     LV Volumes (MOD)  LV vol d, MOD A2C: 272.0 ml  LV vol d, MOD A4C: 211.0 ml  LV vol s, MOD A2C: 202.0 ml  LV vol s, MOD A4C: 166.0 ml  LV SV MOD A2C:     70.0 ml  LV SV MOD A4C:     211.0 ml  LV SV MOD BP:      56.2 ml   RIGHT VENTRICLE  RV S prime:     9.20 cm/s  TAPSE (M-mode): 1.5 cm   LEFT ATRIUM             Index        RIGHT ATRIUM           Index  LA diam:        3.10 cm 1.43 cm/m   RA Area:     12.30 cm  LA Vol (A2C):    66.5 ml 30.77 ml/m  RA Volume:   28.40 ml  13.14 ml/m  LA Vol (A4C):   53.1 ml 24.57 ml/m  LA Biplane Vol: 62.3 ml 28.83 ml/m   AORTIC VALVE  LVOT Vmax:   88.83 cm/s  LVOT Vmean:  56.267 cm/s  LVOT VTI:    0.123 m     AORTA  Ao Root diam: 3.60 cm  Ao Asc diam:  3.70 cm   MITRAL VALVE                  TRICUSPID VALVE  MV Area (PHT): 6.96 cm       TR Peak grad:   13.0 mmHg  MV Decel Time: 109 msec       TR Vmax:        180.00 cm/s  MR Peak grad:    76.7 mmHg  MR Mean grad:    49.0 mmHg    SHUNTS  MR Vmax:         438.00 cm/s  Systemic VTI:  0.12 m  MR Vmean:        326.5 cm/s   Systemic Diam: 2.10 cm  MR PISA:         2.26 cm  MR PISA Eff ROA: 20 mm  MR PISA Radius:  0.60 cm  MV E velocity: 173.00 cm/s  MV A velocity: 76.50 cm/s  MV E/A ratio:  2.26   CARDIAC CATH to be Done   Antimicrobials:  Anti-infectives (From admission, onward)    None       Subjective: Seen and examined at bedside and states she was having some chest tightness and dyspnea this AM but not as bad as prior to coming in. States her back still hurts. Concerned about urinary retention as it has happened in the past. No nausea or vomiting. No other concerns or complaints at this time.   Objective: Vitals:   01/22/21 0104 01/22/21 0108 01/22/21 0347 01/22/21 0420  BP:  102/73 106/79   Pulse:  (!) 101 100   Resp:  17 17   Temp:  (!) 97.5 F (36.4 C) (!) 97.5 F (36.4 C)   TempSrc:  Oral Oral   SpO2:  97% 96% 98%  Weight: 99.2 kg     Height:        Intake/Output Summary (Last 24 hours) at 01/22/2021 0815 Last data filed at 01/22/2021 5732 Gross per 24 hour  Intake 484.51 ml  Output 1300 ml  Net -815.49 ml    Filed Weights   01/20/21 1048 01/21/21 0351 01/22/21 0104  Weight: 99.8 kg 98.4 kg 99.2 kg   Examination: Physical Exam:  Constitutional: WN/WD obese Caucasian female in NAD and appears calm  Eyes: Lids and conjunctivae normal, sclerae anicteric  ENMT: External Ears, Nose  appear normal. Grossly normal hearing.  Neck: Appears normal, supple, no cervical masses, normal ROM, no appreciable thyromegaly; Has some JVD and distended Neck veins Respiratory: Diminished to auscultation bilaterally with coarse breath sounds and some crackles and rales worse on the Right compared to the Left, no wheezing rhonchi. Normal respiratory effort and patient is not tachypenic. No accessory muscle use. Not wearing supplemental O2 via [place] Cardiovascular: RRR, no murmurs / rubs / gallops. S1 and S2 auscultated. Mild LE edema  Abdomen: Soft, non-tender, Distended 2/2 body habitus. Bowel sounds positive.  GU: Deferred. Musculoskeletal: No clubbing / cyanosis of digits/nails. No joint deformity upper and lower extremities.  Skin: No rashes, lesions, ulcers on a limited skin evaluation. No induration; Warm and dry.  Neurologic: CN 2-12 grossly intact with no focal deficits. Romberg sign and cerebellar reflexes not assessed.  Psychiatric: Normal judgment and insight. Alert and oriented x 3. Normal mood and appropriate affect.   Data Reviewed: I have personally reviewed following labs and imaging studies  CBC: Recent Labs  Lab 01/20/21 1139 01/21/21 0318 01/22/21 0101  WBC 11.7* 11.7* 10.0  NEUTROABS 8.8*  --  5.8  HGB 13.8 14.3 13.5  HCT 41.5 42.1 40.8  MCV 86.8 86.8 87.9  PLT 349 349 202    Basic Metabolic Panel: Recent Labs  Lab 01/20/21 1139 01/21/21 0318 01/21/21 0926 01/22/21 0101  NA 139 136  --  137  K 3.8 3.4*  --  4.1  CL 109 106  --  107  CO2 21* 22  --  20*  GLUCOSE 109* 117*  --  109*  BUN 13 9  --  14  CREATININE 0.64 0.69  --  0.69  CALCIUM 9.4 8.9  --  8.7*  MG  --   --  1.7 1.7  PHOS  --   --  3.4 3.1    GFR: Estimated Creatinine Clearance: 103.7 mL/min (by C-G formula based on SCr of 0.69 mg/dL). Liver Function Tests: Recent Labs  Lab 01/21/21 0926 01/22/21 0101  AST 18 18  ALT 14 11  ALKPHOS 72 68  BILITOT 1.1 0.5  PROT 6.6 6.1*  ALBUMIN  3.5 3.3*   No results for input(s): LIPASE, AMYLASE in the last 168 hours. No results for input(s): AMMONIA in the last 168 hours. Coagulation Profile: No results for input(s): INR, PROTIME in the last 168 hours. Cardiac Enzymes: No results for input(s): CKTOTAL, CKMB, CKMBINDEX, TROPONINI in the last 168 hours. BNP (last 3 results) No results for input(s): PROBNP in the last 8760 hours. HbA1C: No results for input(s): HGBA1C in the last 72 hours. CBG: Recent Labs  Lab 01/21/21 2112  GLUCAP 129*   Lipid Profile: No results for input(s): CHOL, HDL, LDLCALC, TRIG, CHOLHDL, LDLDIRECT in the last 72 hours. Thyroid Function Tests: Recent Labs    01/20/21 1814  TSH 1.876  FREET4 1.02    Anemia Panel: No results for input(s): VITAMINB12, FOLATE, FERRITIN, TIBC, IRON, RETICCTPCT in the last 72 hours. Sepsis Labs: Recent Labs  Lab 01/20/21 2000 01/21/21 0318 01/22/21 0101  PROCALCITON <0.10 0.45 <0.10     Recent Results (from the past 240 hour(s))  Resp Panel by RT-PCR (Flu A&B, Covid) Nasopharyngeal Swab     Status: None   Collection Time: 01/20/21  3:20 PM   Specimen: Nasopharyngeal Swab; Nasopharyngeal(NP) swabs in vial transport medium  Result Value Ref Range Status   SARS Coronavirus 2 by RT PCR NEGATIVE NEGATIVE Final    Comment: (NOTE) SARS-CoV-2 target nucleic acids are NOT DETECTED.  The SARS-CoV-2 RNA is generally detectable in upper respiratory specimens during the acute phase of infection. The lowest concentration of SARS-CoV-2 viral copies this assay can detect is 138 copies/mL. A negative result does not preclude SARS-Cov-2 infection and should not be used as the sole basis for treatment or other patient management decisions. A negative result may occur with  improper specimen collection/handling, submission of specimen other than nasopharyngeal swab, presence of viral mutation(s) within the areas targeted by this assay, and inadequate number of  viral copies(<138 copies/mL). A negative result must be combined with clinical observations, patient history, and epidemiological information. The expected result is Negative.  Fact Sheet for Patients:  EntrepreneurPulse.com.au  Fact Sheet for Healthcare Providers:  IncredibleEmployment.be  This test is no t yet approved or cleared by the [place] FDA and  has been authorized for detection and/or diagnosis of SARS-CoV-2 by FDA under an Emergency Use Authorization (EUA). This EUA will remain  in effect (meaning this test can be used) for the duration of the COVID-19 declaration under Section 564(b)(1) of the Act, 21 U.S.C.section 360bbb-3(b)(1), unless the authorization is terminated  or revoked sooner.       Influenza A by PCR NEGATIVE NEGATIVE Final   Influenza B by PCR NEGATIVE NEGATIVE Final    Comment: (NOTE) The Xpert Xpress SARS-CoV-2/FLU/RSV plus assay is intended as an aid in the diagnosis of influenza from Nasopharyngeal swab specimens and should not be used as a sole basis for treatment. Nasal washings and aspirates are unacceptable for Xpert Xpress SARS-CoV-2/FLU/RSV testing.  Fact Sheet for Patients: EntrepreneurPulse.com.au  Fact Sheet for Healthcare Providers: IncredibleEmployment.be  This test is not yet approved or cleared by the [place] FDA and has been authorized for detection and/or diagnosis of SARS-CoV-2 by FDA under an Emergency Use Authorization (EUA). This EUA will remain in effect (meaning this test can be used) for the duration of the COVID-19 declaration under Section 564(b)(1) of the Act, 21 U.S.C. section 360bbb-3(b)(1), unless the authorization is terminated or revoked.  Performed at [facility], [address], [place], [place] [address]      RN Pressure Injury Documentation:     Estimated body mass index is 31.39 kg/m as calculated  from the following:   Height as of this encounter: 5' 10"  (1.778 m).   Weight as of this encounter: 99.2 kg.  Malnutrition Type:   Malnutrition Characteristics:  Nutrition Interventions:    Radiology Studies: DG Chest 2 View  Result Date: 01/20/2021 CLINICAL DATA:  Shortness of breath. EXAM: CHEST - 2 VIEW COMPARISON:  Chest radiograph 05/25/2005 FINDINGS: Patchy airspace opacity at the posterior costophrenic angle. No definite pleural effusion. No pneumothorax. Heart is normal in size. Visualized skeletal structures are unremarkable. Surgical clips in the upper abdomen. IMPRESSION: Patchy airspace opacity in the posterior costophrenic angle may represent pneumonia in the appropriate clinical context. Electronically Signed   By: Ileana Roup M.D.   On: 01/20/2021 12:28   CT Angio Chest PE W and/or Wo Contrast  Result Date: 01/20/2021 CLINICAL DATA:  Intermittent shortness of breath for 6 days. Question pulmonary embolism. EXAM: CT ANGIOGRAPHY CHEST WITH CONTRAST TECHNIQUE: Multidetector CT imaging of the chest was performed using the standard protocol during bolus administration of intravenous contrast. Multiplanar CT image reconstructions and MIPs were obtained to evaluate the vascular anatomy. CONTRAST:  44m OMNIPAQUE IOHEXOL 350 MG/ML SOLN COMPARISON:  Radiographs 01/20/2021 and 05/25/2005. FINDINGS: Cardiovascular: The pulmonary arteries are well opacified with contrast to the level of the subsegmental branches. There is no evidence of acute pulmonary embolism. Subsegmental assessment mildly limited by breathing artifact. No significant systemic arterial abnormalities are identified, although there is limited opacification of the aorta. Minimal aortic and coronary artery atherosclerosis. The heart size is normal. There is no pericardial effusion. Mediastinum/Nodes: There are no enlarged mediastinal, hilar or axillary lymph nodes. The thyroid gland, trachea and esophagus demonstrate no  significant findings. Lungs/Pleura: There are small bilateral pleural effusions. There is diffuse central airway and interstitial thickening throughout both lungs with patchy ground-glass opacities. There are nodular ground-glass components in both upper lobes, most notably on the right on image 30/7. No confluent airspace opacity. Upper abdomen: The visualized upper abdomen appears unremarkable status post cholecystectomy. Musculoskeletal/Chest wall: There is no chest wall mass or suspicious osseous finding. Mild degenerative changes in the spine. Review of the MIP images confirms the above findings. IMPRESSION: 1. No evidence of acute pulmonary embolism or other acute vascular findings in the chest. 2. Patchy ground-glass opacities in both lungs with nodular components in the upper lobes, diffuse central airway and interstitial thickening and small bilateral pleural effusions. Findings are nonspecific, but suspicious for viral infection (consider COVID-19 infection). Recommend radiographic follow-up. 3. Mild Aortic Atherosclerosis (ICD10-I70.0). Electronically Signed   By: WRichardean SaleM.D.   On: 01/20/2021 14:23   DG CHEST PORT 1 VIEW  Result Date: 01/21/2021 CLINICAL DATA:  Shortness of breath EXAM: PORTABLE CHEST 1 VIEW COMPARISON:  January 20, 2021 FINDINGS: The heart size and mediastinal contours are stable. Both lungs are clear. Minimal linear atelectasis of both lung bases are identified. The visualized skeletal structures are unremarkable. IMPRESSION: Minimal linear atelectasis of both lung bases. Electronically Signed   By: WAbelardo DieselM.D.   On: 01/21/2021 11:02   ECHOCARDIOGRAM COMPLETE  Result Date: 01/21/2021    ECHOCARDIOGRAM REPORT   Patient Name:   MMORGHAN KESTERDate of Exam: 01/21/2021 Medical Rec #:  [id number]      Height:       70.0 in  Accession #:    [id number]      Weight:       217.0 lb Date of Birth:  [birth year]       BSA:          2.161 m Patient Age:    50 years        BP:            109/70 mmHg Patient Gender: F               HR:           99 bpm. Exam Location:  Inpatient Procedure: 2D Echo, Cardiac Doppler, Color Doppler and Intracardiac            Opacification Agent  Discussed results with Dr Alfredia Ferguson at 16:05 on 01/21/21. Indications:    CHF-Acute Systolic M08.67  History:        Patient has no prior history of Echocardiogram examinations.                 Signs/Symptoms:Shortness of Breath.  Sonographer:    Bernadene Person RDCS Referring Phys: [id number] [place]  1. Left ventricular ejection fraction, by estimation, is 20 to 25%. The left ventricle has severely decreased function. The left ventricle demonstrates global hypokinesis. The left ventricular internal cavity size was severely dilated. Left ventricular diastolic parameters are indeterminate.  2. LV apical mural thrombus (best seen images 109-110)  3. Right ventricular systolic function is normal. The right ventricular size is normal. There is normal pulmonary artery systolic pressure.  4. The mitral valve is abnormal. Moderate mitral valve regurgitation. Appears functional.  5. The aortic valve was not well visualized. Aortic valve regurgitation is trivial. No aortic stenosis is present.  6. Aortic dilatation noted. There is mild dilatation of the ascending aorta, measuring 37 mm.  7. The inferior vena cava is normal in size with <50% respiratory variability, suggesting right atrial pressure of 8 mmHg. FINDINGS  Left Ventricle: Left ventricular ejection fraction, by estimation, is 20 to 25%. The left ventricle has severely decreased function. The left ventricle demonstrates global hypokinesis. Definity contrast agent was given IV to delineate the left ventricular endocardial borders. The left ventricular internal cavity size was severely dilated. There is no left ventricular hypertrophy. Left ventricular diastolic parameters are indeterminate. Right Ventricle: The right ventricular size is normal. No increase in  right ventricular wall thickness. Right ventricular systolic function is normal. There is normal pulmonary artery systolic pressure. The tricuspid regurgitant velocity is 1.80 m/s, and  with an assumed right atrial pressure of 8 mmHg, the estimated right ventricular systolic pressure is 26.7 mmHg. Left Atrium: Left atrial size was normal in size. Right Atrium: Right atrial size was normal in size. Pericardium: There is no evidence of pericardial effusion. Mitral Valve: The mitral valve is abnormal. Moderate mitral valve regurgitation. Tricuspid Valve: The tricuspid valve is normal in structure. Tricuspid valve regurgitation is trivial. Aortic Valve: The aortic valve was not well visualized. Aortic valve regurgitation is trivial. No aortic stenosis is present. Pulmonic Valve: The pulmonic valve was grossly normal. Pulmonic valve regurgitation is not visualized. Aorta: The aortic root is normal in size and structure and aortic dilatation noted. There is mild dilatation of the ascending aorta, measuring 37 mm. Venous: The inferior vena cava is normal in size with less than 50% respiratory variability, suggesting right atrial pressure of 8 mmHg. IAS/Shunts: The interatrial septum was not well visualized.  LEFT VENTRICLE PLAX 2D LVIDd:  6.40 cm      Diastology LVIDs:         5.80 cm      LV e' medial:    7.20 cm/s LV PW:         0.90 cm      LV E/e' medial:  24.0 LV IVS:        0.90 cm      LV e' lateral:   10.60 cm/s LVOT diam:     2.10 cm      LV E/e' lateral: 16.3 LV SV:         43 LV SV Index:   20 LVOT Area:     3.46 cm  LV Volumes (MOD) LV vol d, MOD A2C: 272.0 ml LV vol d, MOD A4C: 211.0 ml LV vol s, MOD A2C: 202.0 ml LV vol s, MOD A4C: 166.0 ml LV SV MOD A2C:     70.0 ml LV SV MOD A4C:     211.0 ml LV SV MOD BP:      56.2 ml RIGHT VENTRICLE RV S prime:     9.20 cm/s TAPSE (M-mode): 1.5 cm LEFT ATRIUM             Index        RIGHT ATRIUM           Index LA diam:        3.10 cm 1.43 cm/m   RA Area:      12.30 cm LA Vol (A2C):   66.5 ml 30.77 ml/m  RA Volume:   28.40 ml  13.14 ml/m LA Vol (A4C):   53.1 ml 24.57 ml/m LA Biplane Vol: 62.3 ml 28.83 ml/m  AORTIC VALVE LVOT Vmax:   88.83 cm/s LVOT Vmean:  56.267 cm/s LVOT VTI:    0.123 m  AORTA Ao Root diam: 3.60 cm Ao Asc diam:  3.70 cm MITRAL VALVE                  TRICUSPID VALVE MV Area (PHT): 6.96 cm       TR Peak grad:   13.0 mmHg MV Decel Time: 109 msec       TR Vmax:        180.00 cm/s MR Peak grad:    76.7 mmHg MR Mean grad:    49.0 mmHg    SHUNTS MR Vmax:         438.00 cm/s  Systemic VTI:  0.12 m MR Vmean:        326.5 cm/s   Systemic Diam: 2.10 cm MR PISA:         2.26 cm MR PISA Eff ROA: 20 mm MR PISA Radius:  0.60 cm MV E velocity: 173.00 cm/s MV A velocity: 76.50 cm/s MV E/A ratio:  2.26 Oswaldo Milian MD Electronically signed by Oswaldo Milian MD Signature Date/Time: 01/21/2021/4:09:31 PM    Final     Scheduled Meds:  amphetamine-dextroamphetamine  30 mg Oral BID   cyclobenzaprine  20 mg Oral QHS   guaiFENesin  1,200 mg Oral BID   lidocaine  1 patch Transdermal Q24H   metoprolol succinate  12.5 mg Oral Daily   nicotine  21 mg Transdermal Daily   sodium chloride flush  3 mL Intravenous Q12H   venlafaxine XR  75 mg Oral Q breakfast   zolpidem  5 mg Oral QHS   Continuous Infusions:  sodium chloride     heparin 1,550 Units/hr (01/22/21 0406)   magnesium sulfate bolus  IVPB      LOS: 2 days   Kerney Elbe, DO [facility] PAGER is on AMION  If 7PM-7AM, please contact night-coverage [email or web address]

## 2021-01-22 NOTE — Progress Notes (Addendum)
ANTICOAGULATION CONSULT NOTE   Pharmacy Consult for heparin Indication: apical thrombus  Allergies  Allergen Reactions   Sulfa Antibiotics Itching   Erythromycin Hives   Sulfamethoxazole-Trimethoprim Hives   Tramadol Rash and Other (See Comments)    Urinary retention      Patient Measurements: Height: 5' 10"  (177.8 cm) Weight: 99.2 kg (218 lb 12.8 oz) IBW/kg (Calculated) : 68.5 HEPARIN DW (KG): 89.9   Vital Signs: Temp: 97.5 F (36.4 C) (10/09 0347) Temp Source: Oral (10/09 0347) BP: 106/79 (10/09 0347) Pulse Rate: 100 (10/09 0347)  Labs: Recent Labs    01/20/21 1139 01/20/21 1333 01/21/21 0318 01/21/21 2310 01/22/21 0101 01/22/21 0732  HGB 13.8  --  14.3  --  13.5  --   HCT 41.5  --  42.1  --  40.8  --   PLT 349  --  349  --  291  --   HEPARINUNFRC  --   --   --  0.27*  --  0.51  CREATININE 0.64  --  0.69  --  0.69  --   TROPONINIHS 16 18*  --   --   --   --      Estimated Creatinine Clearance: 103.7 mL/min (by C-G formula based on SCr of 0.69 mg/dL).   Medical History: Past Medical History:  Diagnosis Date   Anxiety 01/20/2021   Shortness of breath 01/20/2021   Tobacco abuse 01/20/2021    Medications:  Medications Prior to Admission  Medication Sig Dispense Refill Last Dose   ALPRAZolam (XANAX) 1 MG tablet Take 1-2 mg by mouth in the morning, at noon, in the evening, and at bedtime.   01/20/2021   amphetamine-dextroamphetamine (ADDERALL) 30 MG tablet Take 1 tablet by mouth 2 (two) times daily.   01/19/2021   cyclobenzaprine (FLEXERIL) 10 MG tablet Take 2 tablets by mouth at bedtime.   01/19/2021   guaiFENesin (MUCINEX) 600 MG 12 hr tablet Take 600 mg by mouth 2 (two) times daily as needed for cough.   Past Week   oxymetazoline ([employer] NASAL SPRAY) 0.05 % nasal spray Place 2 sprays into both nostrils 2 (two) times daily as needed for congestion.   01/19/2021   venlafaxine (EFFEXOR) 75 MG tablet Take 75 mg by mouth 3 (three) times daily with meals.   01/20/2021    zolpidem (AMBIEN) 5 MG tablet Take 2.5 mg by mouth at bedtime.   01/19/2021   Scheduled:   amphetamine-dextroamphetamine  30 mg Oral BID   cyclobenzaprine  20 mg Oral QHS   guaiFENesin  1,200 mg Oral BID   lidocaine  1 patch Transdermal Q24H   metoprolol succinate  12.5 mg Oral Daily   nicotine  21 mg Transdermal Daily   sodium chloride flush  3 mL Intravenous Q12H   venlafaxine XR  75 mg Oral Q breakfast   zolpidem  5 mg Oral QHS    ADDENDUM  Repeat heparin level therapeutic at 0.43. Continue current rate and f/u plans post-cath.  Assessment: 55 yo female with heart failure and LV apical thrombus. Pharmacy consulted to dose heparin. She is not on anticoagulation PTA. CBC stable, with slight trend down in platelets. Will continue to monitor. Heparin level therapeutic at 0.51 after dose increase. No bleeding noted.   Goal of Therapy:  Heparin level 0.3-0.7 units/ml Monitor platelets by anticoagulation protocol: Yes   Plan:  -Continue heparin at 1550 units/hr -Recheck heparin level in 6 hours -Daily heparin level and CBC  Thank you for allowing  pharmacy to participate in this patient's care.  Reatha Harps, PharmD PGY1 Pharmacy Resident 01/22/2021 9:23 AM Check [email or web address] for unit specific pharmacy number

## 2021-01-23 ENCOUNTER — Encounter (HOSPITAL_COMMUNITY)
Admission: EM | Disposition: A | Discharge status: Home / Self Care | Payer: Self-pay | Source: Home / Self Care | Attending: Internal Medicine

## 2021-01-23 ENCOUNTER — Encounter (HOSPITAL_COMMUNITY): Payer: Self-pay | Admitting: Internal Medicine

## 2021-01-23 ENCOUNTER — Inpatient Hospital Stay (HOSPITAL_COMMUNITY): Payer: Medicaid Other

## 2021-01-23 DIAGNOSIS — F419 Anxiety disorder, unspecified: Secondary | ICD-10-CM | POA: Diagnosis not present

## 2021-01-23 DIAGNOSIS — I513 Intracardiac thrombosis, not elsewhere classified: Secondary | ICD-10-CM

## 2021-01-23 DIAGNOSIS — I2510 Atherosclerotic heart disease of native coronary artery without angina pectoris: Secondary | ICD-10-CM

## 2021-01-23 DIAGNOSIS — I5041 Acute combined systolic (congestive) and diastolic (congestive) heart failure: Secondary | ICD-10-CM

## 2021-01-23 DIAGNOSIS — I5021 Acute systolic (congestive) heart failure: Secondary | ICD-10-CM | POA: Diagnosis not present

## 2021-01-23 DIAGNOSIS — R0789 Other chest pain: Secondary | ICD-10-CM | POA: Diagnosis not present

## 2021-01-23 DIAGNOSIS — R0602 Shortness of breath: Secondary | ICD-10-CM | POA: Diagnosis not present

## 2021-01-23 DIAGNOSIS — Z720 Tobacco use: Secondary | ICD-10-CM

## 2021-01-23 HISTORY — PX: RIGHT/LEFT HEART CATH AND CORONARY ANGIOGRAPHY: CATH118266

## 2021-01-23 LAB — POCT I-STAT EG7
Acid-Base Excess: 0 mmol/L (ref 0.0–2.0)
Acid-base deficit: 1 mmol/L (ref 0.0–2.0)
Acid-base deficit: 3 mmol/L — ABNORMAL HIGH (ref 0.0–2.0)
Bicarbonate: 22.7 mmol/L (ref 20.0–28.0)
Bicarbonate: 24.7 mmol/L (ref 20.0–28.0)
Bicarbonate: 25.7 mmol/L (ref 20.0–28.0)
Calcium, Ion: 0.94 mmol/L — ABNORMAL LOW (ref 1.15–1.40)
Calcium, Ion: 1.23 mmol/L (ref 1.15–1.40)
Calcium, Ion: 1.24 mmol/L (ref 1.15–1.40)
HCT: 33 % — ABNORMAL LOW (ref 36.0–46.0)
HCT: 36 % (ref 36.0–46.0)
HCT: 38 % (ref 36.0–46.0)
Hemoglobin: 11.2 g/dL — ABNORMAL LOW (ref 12.0–15.0)
Hemoglobin: 12.2 g/dL (ref 12.0–15.0)
Hemoglobin: 12.9 g/dL (ref 12.0–15.0)
O2 Saturation: 65 %
O2 Saturation: 68 %
O2 Saturation: 75 %
Potassium: 3.4 mmol/L — ABNORMAL LOW (ref 3.5–5.1)
Potassium: 3.6 mmol/L (ref 3.5–5.1)
Potassium: 4 mmol/L (ref 3.5–5.1)
Sodium: 140 mmol/L (ref 135–145)
Sodium: 144 mmol/L (ref 135–145)
Sodium: 144 mmol/L (ref 135–145)
TCO2: 24 mmol/L (ref 22–32)
TCO2: 26 mmol/L (ref 22–32)
TCO2: 27 mmol/L (ref 22–32)
pCO2, Ven: 42.7 mmHg — ABNORMAL LOW (ref 44.0–60.0)
pCO2, Ven: 43.4 mmHg — ABNORMAL LOW (ref 44.0–60.0)
pCO2, Ven: 46.6 mmHg (ref 44.0–60.0)
pH, Ven: 7.325 (ref 7.250–7.430)
pH, Ven: 7.349 (ref 7.250–7.430)
pH, Ven: 7.37 (ref 7.250–7.430)
pO2, Ven: 36 mmHg (ref 32.0–45.0)
pO2, Ven: 37 mmHg (ref 32.0–45.0)
pO2, Ven: 43 mmHg (ref 32.0–45.0)

## 2021-01-23 LAB — CBC WITH DIFFERENTIAL/PLATELET
Abs Immature Granulocytes: 0.03 10*3/uL (ref 0.00–0.07)
Basophils Absolute: 0.1 10*3/uL (ref 0.0–0.1)
Basophils Relative: 1 %
Eosinophils Absolute: 0.3 10*3/uL (ref 0.0–0.5)
Eosinophils Relative: 3 %
HCT: 41.6 % (ref 36.0–46.0)
Hemoglobin: 13.6 g/dL (ref 12.0–15.0)
Immature Granulocytes: 0 %
Lymphocytes Relative: 32 %
Lymphs Abs: 2.8 10*3/uL (ref 0.7–4.0)
MCH: 29.3 pg (ref 26.0–34.0)
MCHC: 32.7 g/dL (ref 30.0–36.0)
MCV: 89.7 fL (ref 80.0–100.0)
Monocytes Absolute: 0.6 10*3/uL (ref 0.1–1.0)
Monocytes Relative: 7 %
Neutro Abs: 4.9 10*3/uL (ref 1.7–7.7)
Neutrophils Relative %: 57 %
Platelets: 298 10*3/uL (ref 150–400)
RBC: 4.64 MIL/uL (ref 3.87–5.11)
RDW: 13.5 % (ref 11.5–15.5)
WBC: 8.7 10*3/uL (ref 4.0–10.5)
nRBC: 0 % (ref 0.0–0.2)

## 2021-01-23 LAB — POCT I-STAT 7, (LYTES, BLD GAS, ICA,H+H)
Acid-base deficit: 2 mmol/L (ref 0.0–2.0)
Bicarbonate: 22.9 mmol/L (ref 20.0–28.0)
Calcium, Ion: 1.07 mmol/L — ABNORMAL LOW (ref 1.15–1.40)
HCT: 35 % — ABNORMAL LOW (ref 36.0–46.0)
Hemoglobin: 11.9 g/dL — ABNORMAL LOW (ref 12.0–15.0)
O2 Saturation: 99 %
Potassium: 3.6 mmol/L (ref 3.5–5.1)
Sodium: 146 mmol/L — ABNORMAL HIGH (ref 135–145)
TCO2: 24 mmol/L (ref 22–32)
pCO2 arterial: 40.1 mmHg (ref 32.0–48.0)
pH, Arterial: 7.364 (ref 7.350–7.450)
pO2, Arterial: 127 mmHg — ABNORMAL HIGH (ref 83.0–108.0)

## 2021-01-23 LAB — COMPREHENSIVE METABOLIC PANEL
ALT: 22 U/L (ref 0–44)
AST: 22 U/L (ref 15–41)
Albumin: 3.3 g/dL — ABNORMAL LOW (ref 3.5–5.0)
Alkaline Phosphatase: 66 U/L (ref 38–126)
Anion gap: 6 (ref 5–15)
BUN: 14 mg/dL (ref 6–20)
CO2: 24 mmol/L (ref 22–32)
Calcium: 8.6 mg/dL — ABNORMAL LOW (ref 8.9–10.3)
Chloride: 106 mmol/L (ref 98–111)
Creatinine, Ser: 0.68 mg/dL (ref 0.44–1.00)
GFR, Estimated: >60 mL/min (ref >60)
Glucose, Bld: 102 mg/dL — ABNORMAL HIGH (ref 70–99)
Potassium: 4.1 mmol/L (ref 3.5–5.1)
Sodium: 136 mmol/L (ref 135–145)
Total Bilirubin: 0.5 mg/dL (ref 0.3–1.2)
Total Protein: 6.3 g/dL — ABNORMAL LOW (ref 6.5–8.1)

## 2021-01-23 LAB — HEMOGLOBIN A1C
Hgb A1c MFr Bld: 5.8 % — ABNORMAL HIGH (ref 4.8–5.6)
Mean Plasma Glucose: 119.76 mg/dL

## 2021-01-23 LAB — T3, FREE: T3, Free: 3 pg/mL (ref 2.0–4.4)

## 2021-01-23 LAB — MYCOPLASMA PNEUMONIAE ANTIBODY, IGM: Mycoplasma pneumo IgM: <770 U/mL (ref 0–769)

## 2021-01-23 LAB — MAGNESIUM: Magnesium: 1.8 mg/dL (ref 1.7–2.4)

## 2021-01-23 LAB — HEPARIN LEVEL (UNFRACTIONATED): Heparin Unfractionated: 0.65 IU/mL (ref 0.30–0.70)

## 2021-01-23 LAB — PHOSPHORUS: Phosphorus: 3.6 mg/dL (ref 2.5–4.6)

## 2021-01-23 SURGERY — RIGHT/LEFT HEART CATH AND CORONARY ANGIOGRAPHY
Anesthesia: LOCAL

## 2021-01-23 MED ORDER — ENOXAPARIN SODIUM 40 MG/0.4ML IJ SOSY: 40.0000 mg | PREFILLED_SYRINGE | INTRAMUSCULAR | Status: DC

## 2021-01-23 MED ORDER — SODIUM CHLORIDE 0.9% FLUSH
3.0000 mL | Freq: Two times a day (BID) | INTRAVENOUS | Status: DC
  Administered 2021-01-24 – 2021-01-25 (×2): 3 mL via INTRAVENOUS

## 2021-01-23 MED ORDER — LIDOCAINE HCL (PF) 1 % IJ SOLN
INTRAMUSCULAR | Status: DC | PRN
  Administered 2021-01-23 (×2): 2 mL via SUBCUTANEOUS

## 2021-01-23 MED ORDER — SODIUM CHLORIDE 0.9% FLUSH: 3.0000 mL | INTRAVENOUS | Status: DC | PRN

## 2021-01-23 MED ORDER — FENTANYL CITRATE (PF) 100 MCG/2ML IJ SOLN
INTRAMUSCULAR | Status: AC
  Filled 2021-01-23: qty 2

## 2021-01-23 MED ORDER — SODIUM CHLORIDE 0.9 % IV SOLN: 250.0000 mL | INTRAVENOUS | Status: DC | PRN

## 2021-01-23 MED ORDER — VERAPAMIL HCL 2.5 MG/ML IV SOLN
INTRAVENOUS | Status: AC
  Filled 2021-01-23: qty 2

## 2021-01-23 MED ORDER — DIGOXIN 125 MCG PO TABS
0.1250 mg | ORAL_TABLET | Freq: Every day | ORAL | Status: DC
  Administered 2021-01-23 – 2021-01-25 (×3): 0.125 mg via ORAL
  Filled 2021-01-23 (×3): qty 1

## 2021-01-23 MED ORDER — SPIRONOLACTONE 12.5 MG HALF TABLET
12.5000 mg | ORAL_TABLET | Freq: Every day | ORAL | Status: DC
  Administered 2021-01-23 – 2021-01-25 (×3): 12.5 mg via ORAL
  Filled 2021-01-23 (×3): qty 1

## 2021-01-23 MED ORDER — ONDANSETRON HCL 4 MG/2ML IJ SOLN: 4.0000 mg | Freq: Four times a day (QID) | INTRAMUSCULAR | Status: DC | PRN

## 2021-01-23 MED ORDER — APIXABAN 5 MG PO TABS
5.0000 mg | ORAL_TABLET | Freq: Two times a day (BID) | ORAL | Status: DC
  Administered 2021-01-23 – 2021-01-25 (×4): 5 mg via ORAL
  Filled 2021-01-23 (×4): qty 1

## 2021-01-23 MED ORDER — HEPARIN (PORCINE) IN NACL 1000-0.9 UT/500ML-% IV SOLN
INTRAVENOUS | Status: DC | PRN
  Administered 2021-01-23 (×2): 500 mL

## 2021-01-23 MED ORDER — MAGNESIUM SULFATE 2 GM/50ML IV SOLN
2.0000 g | Freq: Once | INTRAVENOUS | Status: AC
  Administered 2021-01-23: 2 g via INTRAVENOUS
  Filled 2021-01-23: qty 50

## 2021-01-23 MED ORDER — OXYCODONE HCL 5 MG PO TABS
5.0000 mg | ORAL_TABLET | Freq: Four times a day (QID) | ORAL | Status: DC | PRN
  Administered 2021-01-23 – 2021-01-24 (×4): 10 mg via ORAL
  Filled 2021-01-23 (×4): qty 2

## 2021-01-23 MED ORDER — MIDAZOLAM HCL 2 MG/2ML IJ SOLN
INTRAMUSCULAR | Status: DC | PRN
  Administered 2021-01-23: 1 mg via INTRAVENOUS

## 2021-01-23 MED ORDER — ACETAMINOPHEN 325 MG PO TABS: 650.0000 mg | ORAL_TABLET | ORAL | Status: DC | PRN

## 2021-01-23 MED ORDER — ASPIRIN EC 81 MG PO TBEC
81.0000 mg | DELAYED_RELEASE_TABLET | Freq: Every day | ORAL | Status: DC
  Administered 2021-01-24 – 2021-01-25 (×2): 81 mg via ORAL
  Filled 2021-01-23 (×2): qty 1

## 2021-01-23 MED ORDER — ALPRAZOLAM 0.5 MG PO TABS
1.0000 mg | ORAL_TABLET | Freq: Three times a day (TID) | ORAL | Status: DC | PRN
  Administered 2021-01-23: 1.5 mg via ORAL
  Administered 2021-01-23 – 2021-01-25 (×5): 2 mg via ORAL
  Filled 2021-01-23 (×6): qty 4

## 2021-01-23 MED ORDER — HEPARIN SODIUM (PORCINE) 1000 UNIT/ML IJ SOLN
INTRAMUSCULAR | Status: DC | PRN
  Administered 2021-01-23: 5000 [IU] via INTRAVENOUS

## 2021-01-23 MED ORDER — MIDAZOLAM HCL 2 MG/2ML IJ SOLN
INTRAMUSCULAR | Status: AC
  Filled 2021-01-23: qty 2

## 2021-01-23 MED ORDER — IOHEXOL 350 MG/ML SOLN
INTRAVENOUS | Status: DC | PRN
  Administered 2021-01-23: 40 mL via INTRA_ARTERIAL

## 2021-01-23 MED ORDER — LIDOCAINE HCL (PF) 1 % IJ SOLN
INTRAMUSCULAR | Status: AC
  Filled 2021-01-23: qty 30

## 2021-01-23 MED ORDER — FENTANYL CITRATE (PF) 100 MCG/2ML IJ SOLN
INTRAMUSCULAR | Status: DC | PRN
  Administered 2021-01-23: 25 ug via INTRAVENOUS

## 2021-01-23 MED ORDER — LABETALOL HCL 5 MG/ML IV SOLN: 10.0000 mg | INTRAVENOUS | Status: AC | PRN

## 2021-01-23 MED ORDER — HYDRALAZINE HCL 20 MG/ML IJ SOLN: 10.0000 mg | INTRAMUSCULAR | Status: AC | PRN

## 2021-01-23 MED ORDER — ATORVASTATIN CALCIUM 80 MG PO TABS
80.0000 mg | ORAL_TABLET | Freq: Every day | ORAL | Status: DC
  Administered 2021-01-23 – 2021-01-25 (×3): 80 mg via ORAL
  Filled 2021-01-23 (×3): qty 1

## 2021-01-23 MED ORDER — HEPARIN (PORCINE) 25000 UT/250ML-% IV SOLN: 1550.0000 [IU]/h | INTRAVENOUS | Status: DC

## 2021-01-23 MED ORDER — VERAPAMIL HCL 2.5 MG/ML IV SOLN
INTRAVENOUS | Status: DC | PRN
  Administered 2021-01-23: 10 mL via INTRA_ARTERIAL

## 2021-01-23 MED ORDER — FUROSEMIDE 10 MG/ML IJ SOLN
80.0000 mg | Freq: Two times a day (BID) | INTRAMUSCULAR | Status: DC
  Administered 2021-01-23 – 2021-01-24 (×2): 80 mg via INTRAVENOUS
  Filled 2021-01-23 (×2): qty 8

## 2021-01-23 MED ORDER — HEPARIN SODIUM (PORCINE) 1000 UNIT/ML IJ SOLN
INTRAMUSCULAR | Status: AC
  Filled 2021-01-23: qty 1

## 2021-01-23 MED ORDER — HEPARIN (PORCINE) IN NACL 1000-0.9 UT/500ML-% IV SOLN
INTRAVENOUS | Status: AC
  Filled 2021-01-23: qty 1000

## 2021-01-23 SURGICAL SUPPLY — 10 items
CATH 5FR JL3.5 JR4 ANG PIG MP (CATHETERS) ×1 IMPLANT
CATH BALLN WEDGE 5F 110CM (CATHETERS) ×1 IMPLANT
DEVICE RAD COMP TR BAND LRG (VASCULAR PRODUCTS) ×1 IMPLANT
GLIDESHEATH SLEND SS 6F .021 (SHEATH) ×1 IMPLANT
GUIDEWIRE .025 260CM (WIRE) ×1 IMPLANT
GUIDEWIRE INQWIRE 1.5J.035X260 (WIRE) IMPLANT
INQWIRE 1.5J .035X260CM (WIRE) ×2
PACK CARDIAC CATHETERIZATION (CUSTOM PROCEDURE TRAY) ×2 IMPLANT
SHEATH GLIDE SLENDER 4/5FR (SHEATH) ×1 IMPLANT
TRANSDUCER W/STOPCOCK (MISCELLANEOUS) ×2 IMPLANT

## 2021-01-23 NOTE — Interval H&P Note (Signed)
History and Physical Interval Note:  01/23/2021 1:42 PM  Christine [name]  has presented today for surgery, with the diagnosis of acute systolic HF. The various methods of treatment have been discussed with the patient and family. After consideration of risks, benefits and other options for treatment, the patient has consented to  Procedure(s): RIGHT/LEFT HEART CATH AND CORONARY ANGIOGRAPHY (N/A) and possible coronary angioplasty as a surgical intervention.  The patient's history has been reviewed, patient examined, no change in status, stable for surgery.  I have reviewed the patient's chart and labs.  Questions were answered to the patient's satisfaction.     [name]

## 2021-01-23 NOTE — Progress Notes (Signed)
HF CSW reached out to [employer] regarding patient being screened for Medicaid. The patient's account indicates a Medicaid screen was completed on 01/20/2021 and the account is referred to a Financial Navigator to further evaluate no other follow up noted in the account and CSW reached out to [employer] for follow up regarding the Medicaid.  Christine [name], MSW, [place] Heart Failure Social Worker

## 2021-01-23 NOTE — Progress Notes (Signed)
Progress Note  Patient Name: Christine [name] Date of Encounter: 01/23/2021  [facility] Cardiologist: Skeet Latch, MD   Subjective   Denies dyspnea at rest or with light activity.  Has a constant discomfort in her posterior lower right chest that radiates sometimes to the front, worsened by trying to lie flat, not related to breathing or coughing, not worsened by exertion, improved with oxycodone.  Inpatient Medications    Scheduled Meds:  amphetamine-dextroamphetamine  30 mg Oral BID   cyclobenzaprine  20 mg Oral QHS   guaiFENesin  1,200 mg Oral BID   lidocaine  1 patch Transdermal Q24H   metoprolol succinate  12.5 mg Oral BID   nicotine  21 mg Transdermal Daily   sodium chloride flush  3 mL Intravenous Q12H   sodium chloride flush  3 mL Intravenous Q12H   venlafaxine XR  75 mg Oral Q breakfast   zolpidem  5 mg Oral QHS   Continuous Infusions:  sodium chloride     sodium chloride     sodium chloride 50 mL/hr at 01/23/21 0205   heparin 1,550 Units/hr (01/22/21 1943)   PRN Meds: sodium chloride, sodium chloride, acetaminophen, ALPRAZolam, benzonatate, ipratropium-albuterol, ondansetron (ZOFRAN) IV, oxyCODONE, sodium chloride flush, sodium chloride flush   Vital Signs    Vitals:   01/22/21 2050 01/22/21 2050 01/23/21 0502 01/23/21 0822  BP: 97/67 97/67 96/71  (!) 89/78  Pulse: (!) 105 (!) 104 (!) 102 96  Resp:  18 18 20   Temp:  98.4 F (36.9 C) 98 F (36.7 C) 98.4 F (36.9 C)  TempSrc:  Oral Oral Oral  SpO2:  98% 97% 95%  Weight:   99.3 kg   Height:        Intake/Output Summary (Last 24 hours) at 01/23/2021 0936 Last data filed at 01/23/2021 0835 Gross per 24 hour  Intake 1298.58 ml  Output 1550 ml  Net -251.42 ml   Last 3 Weights 01/23/2021 01/22/2021 01/21/2021  Weight (lbs) 219 lb 218 lb 12.8 oz 217 lb  Weight (kg) 99.338 kg 99.247 kg 98.431 kg      Telemetry    Sinus rhythm/mild sinus tachycardia- Personally Reviewed  ECG    Sinus  tachycardia, left atrial abnormality, Q waves in leads II, 3, aVF, V4-V6, nonspecific intraventricular conduction delay, prolonged QT- Personally Reviewed  Physical Exam  Mildly obese, appears comfortable GEN: No acute distress.   Neck: No JVD Cardiac: RRR, no murmurs, rubs, or gallops.  Respiratory: Clear to auscultation bilaterally. GI: Soft, nontender, non-distended  MS: No edema; No deformity. Neuro:  Nonfocal  Psych: Normal affect   Labs    High Sensitivity Troponin:   Recent Labs  Lab 01/20/21 1139 01/20/21 1333  TROPONINIHS 16 18*     Chemistry Recent Labs  Lab 01/21/21 0318 01/21/21 0926 01/22/21 0101 01/23/21 0627  NA 136  --  137 136  K 3.4*  --  4.1 4.1  CL 106  --  107 106  CO2 22  --  20* 24  GLUCOSE 117*  --  109* 102*  BUN 9  --  14 14  CREATININE 0.69  --  0.69 0.68  CALCIUM 8.9  --  8.7* 8.6*  MG  --  1.7 1.7 1.8  PROT  --  6.6 6.1* 6.3*  ALBUMIN  --  3.5 3.3* 3.3*  AST  --  18 18 22   ALT  --  14 11 22   ALKPHOS  --  72 68 66  BILITOT  --  1.1 0.5 0.5  GFRNONAA >60  --  >60 >60  ANIONGAP 8  --  10 6    Lipids No results for input(s): CHOL, TRIG, HDL, LABVLDL, LDLCALC, CHOLHDL in the last 168 hours.  Hematology Recent Labs  Lab 01/21/21 0318 01/22/21 0101 01/23/21 0418  WBC 11.7* 10.0 8.7  RBC 4.85 4.64 4.64  HGB 14.3 13.5 13.6  HCT 42.1 40.8 41.6  MCV 86.8 87.9 89.7  MCH 29.5 29.1 29.3  MCHC 34.0 33.1 32.7  RDW 13.4 13.3 13.5  PLT 349 291 298   Thyroid  Recent Labs  Lab 01/20/21 1814  TSH 1.876  FREET4 1.02    BNP Recent Labs  Lab 01/20/21 1139  BNP 658.1*    DDimer  Recent Labs  Lab 01/20/21 1126  DDIMER 1.26*     Radiology    DG CHEST PORT 1 VIEW  Result Date: 01/23/2021 CLINICAL DATA:  Shortness of breath, chest tightness EXAM: PORTABLE CHEST 1 VIEW COMPARISON:  01/22/2021 FINDINGS: Similar interstitial changes. No new consolidation. No pleural effusion. No pneumothorax. Stable normal heart size. IMPRESSION:  No significant change. Likely chronic interstitial changes. Minimal superimposed interstitial edema remains possible. Electronically Signed   By: Macy Mis M.D.   On: 01/23/2021 08:48   DG CHEST PORT 1 VIEW  Result Date: 01/22/2021 CLINICAL DATA:  55 year old female with a history of shortness of breath EXAM: PORTABLE CHEST 1 VIEW COMPARISON:  01/21/2021 FINDINGS: Cardiomediastinal silhouette unchanged in size and contour. Interlobular septal thickening persists. No new confluent airspace disease. Mild coarsened interstitial markings. No acute displaced fracture. Degenerative changes of the spine. IMPRESSION: Persisting interlobular septal thickening, suggesting persisting edema, with no new confluent airspace disease. Electronically Signed   By: Corrie Mckusick D.O.   On: 01/22/2021 12:43   ECHOCARDIOGRAM COMPLETE  Result Date: 01/21/2021    ECHOCARDIOGRAM REPORT   Patient Name:   Christine [name] Date of Exam: 01/21/2021 Medical Rec #:  [id number]       Height:       70.0 in Accession #:    [id number]      Weight:       217.0 lb Date of Birth:  Jan 02, 1966       BSA:          2.161 m Patient Age:    55 years        BP:           109/70 mmHg Patient Gender: F               HR:           99 bpm. Exam Location:  Inpatient Procedure: 2D Echo, Cardiac Doppler, Color Doppler and Intracardiac            Opacification Agent  Discussed results with Dr Alfredia Ferguson at 16:05 on 01/21/21. Indications:    CHF-Acute Systolic F02.63  History:        Patient has no prior history of Echocardiogram examinations.                 Signs/Symptoms:Shortness of Breath.  Sonographer:    Bernadene Person RDCS Referring Phys: [id number] [place]  1. Left ventricular ejection fraction, by estimation, is 20 to 25%. The left ventricle has severely decreased function. The left ventricle demonstrates global hypokinesis. The left ventricular internal cavity size was severely dilated. Left ventricular diastolic parameters are  indeterminate.  2. LV apical mural thrombus (best seen images 109-110)  3. Right ventricular systolic  function is normal. The right ventricular size is normal. There is normal pulmonary artery systolic pressure.  4. The mitral valve is abnormal. Moderate mitral valve regurgitation. Appears functional.  5. The aortic valve was not well visualized. Aortic valve regurgitation is trivial. No aortic stenosis is present.  6. Aortic dilatation noted. There is mild dilatation of the ascending aorta, measuring 37 mm.  7. The inferior vena cava is normal in size with <50% respiratory variability, suggesting right atrial pressure of 8 mmHg. FINDINGS  Left Ventricle: Left ventricular ejection fraction, by estimation, is 20 to 25%. The left ventricle has severely decreased function. The left ventricle demonstrates global hypokinesis. Definity contrast agent was given IV to delineate the left ventricular endocardial borders. The left ventricular internal cavity size was severely dilated. There is no left ventricular hypertrophy. Left ventricular diastolic parameters are indeterminate. Right Ventricle: The right ventricular size is normal. No increase in right ventricular wall thickness. Right ventricular systolic function is normal. There is normal pulmonary artery systolic pressure. The tricuspid regurgitant velocity is 1.80 m/s, and  with an assumed right atrial pressure of 8 mmHg, the estimated right ventricular systolic pressure is 44.9 mmHg. Left Atrium: Left atrial size was normal in size. Right Atrium: Right atrial size was normal in size. Pericardium: There is no evidence of pericardial effusion. Mitral Valve: The mitral valve is abnormal. Moderate mitral valve regurgitation. Tricuspid Valve: The tricuspid valve is normal in structure. Tricuspid valve regurgitation is trivial. Aortic Valve: The aortic valve was not well visualized. Aortic valve regurgitation is trivial. No aortic stenosis is present. Pulmonic Valve: The  pulmonic valve was grossly normal. Pulmonic valve regurgitation is not visualized. Aorta: The aortic root is normal in size and structure and aortic dilatation noted. There is mild dilatation of the ascending aorta, measuring 37 mm. Venous: The inferior vena cava is normal in size with less than 50% respiratory variability, suggesting right atrial pressure of 8 mmHg. IAS/Shunts: The interatrial septum was not well visualized.  LEFT VENTRICLE PLAX 2D LVIDd:         6.40 cm      Diastology LVIDs:         5.80 cm      LV e' medial:    7.20 cm/s LV PW:         0.90 cm      LV E/e' medial:  24.0 LV IVS:        0.90 cm      LV e' lateral:   10.60 cm/s LVOT diam:     2.10 cm      LV E/e' lateral: 16.3 LV SV:         43 LV SV Index:   20 LVOT Area:     3.46 cm  LV Volumes (MOD) LV vol d, MOD A2C: 272.0 ml LV vol d, MOD A4C: 211.0 ml LV vol s, MOD A2C: 202.0 ml LV vol s, MOD A4C: 166.0 ml LV SV MOD A2C:     70.0 ml LV SV MOD A4C:     211.0 ml LV SV MOD BP:      56.2 ml RIGHT VENTRICLE RV S prime:     9.20 cm/s TAPSE (M-mode): 1.5 cm LEFT ATRIUM             Index        RIGHT ATRIUM           Index LA diam:        3.10 cm 1.43 cm/m  RA Area:     12.30 cm LA Vol (A2C):   66.5 ml 30.77 ml/m  RA Volume:   28.40 ml  13.14 ml/m LA Vol (A4C):   53.1 ml 24.57 ml/m LA Biplane Vol: 62.3 ml 28.83 ml/m  AORTIC VALVE LVOT Vmax:   88.83 cm/s LVOT Vmean:  56.267 cm/s LVOT VTI:    0.123 m  AORTA Ao Root diam: 3.60 cm Ao Asc diam:  3.70 cm MITRAL VALVE                  TRICUSPID VALVE MV Area (PHT): 6.96 cm       TR Peak grad:   13.0 mmHg MV Decel Time: 109 msec       TR Vmax:        180.00 cm/s MR Peak grad:    76.7 mmHg MR Mean grad:    49.0 mmHg    SHUNTS MR Vmax:         438.00 cm/s  Systemic VTI:  0.12 m MR Vmean:        326.5 cm/s   Systemic Diam: 2.10 cm MR PISA:         2.26 cm MR PISA Eff ROA: 20 mm MR PISA Radius:  0.60 cm MV E velocity: 173.00 cm/s MV A velocity: 76.50 cm/s MV E/A ratio:  2.26 Oswaldo Milian MD  Electronically signed by Oswaldo Milian MD Signature Date/Time: 01/21/2021/4:09:31 PM    Final     Cardiac Studies   ECHO: 01/21/2021  1. Left ventricular ejection fraction, by estimation, is 20 to 25%. The  left ventricle has severely decreased function. The left ventricle  demonstrates global hypokinesis. The left ventricular internal cavity size  was severely dilated. Left ventricular diastolic parameters are indeterminate.   2. LV apical mural thrombus (best seen images 109-110)   3. Right ventricular systolic function is normal. The right ventricular  size is normal. There is normal pulmonary artery systolic pressure.   4. The mitral valve is abnormal. Moderate mitral valve regurgitation.  Appears functional.   5. The aortic valve was not well visualized. Aortic valve regurgitation  is trivial. No aortic stenosis is present.   6. Aortic dilatation noted. There is mild dilatation of the ascending  aorta, measuring 37 mm.   7. The inferior vena cava is normal in size with <50% respiratory  variability, suggesting right atrial pressure of 8 mmHg.   Patient Profile     55 y.o. female smoker without other known chronic medical conditions presenting with acute pulmonary edema secondary to combined systolic and diastolic heart failure, LVEF 20-25%, LV apical mural thrombus, improved after intravenous diuretics  Assessment & Plan    Plan for cardiac catheterization with coronary angiography as well as measurement of right heart pressures today. After cardiac catheterization is completed, transition from intravenous heparin to warfarin (anticipate discharge with enoxaparin bridge, but will have to figure out the financial implications).   Low systemic blood pressure limits the use of typical guideline directed medical therapy for cardiomyopathy/heart failure.  We could use an SGLT2 inhibitor but again have to figure out how we will provide this. If no CAD on cath, cMRI will be  useful. Does not have medical insurance. Will engage the advanced heart failure service, transition of care team and social worker.      For questions or updates, please contact [place] Please consult [email or web address] for contact info under        Signed, Sanda Klein, MD  01/23/2021, 9:36 AM

## 2021-01-23 NOTE — Consult Note (Signed)
Advanced Heart Failure Team Consult Note   Primary Physician: Pcp, No PCP-Cardiologist:  Skeet Latch, MD  Reason for Consultation: Heart failure  HPI:    Christine [name] is seen today for evaluation of acute heart failure  at the request of Dr. Bonne Dolores  This is a 55 yo female with history of chronic tobacco use, ADHD, anxiety, depression,. No prior cardiac history. Presented on 10/07 with shortness of breath and chest tightness X 5 days. Had COVID in July 2022.  On presentation, she was tachycardic with HR 110s-120s. Ddimer +, CTA ruled out PE. Notable for patchy groundglass opacities bilaterally with central airway interstitial thickening and small pelural effusions. Negative for COVID and influenza. HS troponin 16 > 18. BNP 658. WBC 11.7, Hgb 13.8, TSH normal, Scr 0.64, C02 21, K 3.8. Procalcitonin < 0.10. Cardiology consulted. Echo with EF 20-25%, LV severely dilated, LV apical mural thrombus, RV okay, moderate MR. LHC today with 99% mid to distal LAD, 99% 2nd sept, LVEF visually 20-25%. On RHC elevated fililng pressures with moderately reduced CO. Significant v waves in PCWP tracing suggestive of significant MR vs diastolic dysfunction. Digoxin added. Beta blocker stopped. Has been diuresing with IV lasix.   Review of Systems: [y] = yes, [ ]  = no   General: Weight gain [ ] ; Weight loss [ ] ; Anorexia [ ] ; Fatigue [ y]; Fever [ ] ; Chills [ ] ; Weakness [ ]   Cardiac: Chest pain/pressure [ ] ; Resting SOB [ y]; Exertional SOB [ y]; Orthopnea [ ] ; Pedal Edema [ ] ; Palpitations [ ] ; Syncope [ ] ; Presyncope [ ] ; Paroxysmal nocturnal dyspnea[ ]   Pulmonary: Cough [ y]; Wheezing[ ] ; Hemoptysis[ ] ; Sputum [ ] ; Snoring [ ]   GI: Vomiting[ ] ; Dysphagia[ ] ; Melena[ ] ; Hematochezia [ ] ; Heartburn[ ] ; Abdominal pain [ ] ; Constipation [ ] ; Diarrhea [ ] ; BRBPR [ ]   GU: Hematuria[ ] ; Dysuria [ ] ; Nocturia[ ]   Vascular: Pain in legs with walking [ ] ; Pain in feet with lying flat [ ] ; Non-healing  sores [ ] ; Stroke [ ] ; TIA [ ] ; Slurred speech [ ] ;  Neuro: Headaches[ ] ; Vertigo[ ] ; Seizures[ ] ; Paresthesias[ ] ;Blurred vision [ ] ; Diplopia [ ] ; Vision changes [ ]   Ortho/Skin: Arthritis Blue.Reese ]; Joint pain [ y]; Muscle pain [ ] ; Joint swelling [ ] ; Back Pain [ ] ; Rash [ ]   Psych: Depression[ y]; Anxiety[ y]  Heme: Bleeding problems [ ] ; Clotting disorders [ ] ; Anemia [ ]   Endocrine: Diabetes [ ] ; Thyroid dysfunction[ ]   Home Medications Prior to Admission medications   Medication Sig Start Date End Date Taking? Authorizing Provider  ALPRAZolam Duanne Moron) 1 MG tablet Take 1-2 mg by mouth in the morning, at noon, in the evening, and at bedtime. 12/10/17  Yes [provider]  amphetamine-dextroamphetamine (ADDERALL) 30 MG tablet Take 1 tablet by mouth 2 (two) times daily. 07/29/20  Yes [provider]  cyclobenzaprine (FLEXERIL) 10 MG tablet Take 2 tablets by mouth at bedtime. 08/05/20  Yes [provider]  guaiFENesin (MUCINEX) 600 MG 12 hr tablet Take 600 mg by mouth 2 (two) times daily as needed for cough.   Yes [provider]  oxymetazoline ([employer] NASAL SPRAY) 0.05 % nasal spray Place 2 sprays into both nostrils 2 (two) times daily as needed for congestion.   Yes [provider]  venlafaxine (EFFEXOR) 75 MG tablet Take 75 mg by mouth 3 (three) times daily with meals.   Yes [provider]  zolpidem Lorrin Mais)  5 MG tablet Take 2.5 mg by mouth at bedtime.   Yes [provider]    Past Medical History: Past Medical History:  Diagnosis Date   Anxiety 01/20/2021   Shortness of breath 01/20/2021   Tobacco abuse 01/20/2021    Past Surgical History: Past Surgical History:  Procedure Laterality Date   ABDOMINAL HYSTERECTOMY     CHOLECYSTECTOMY     NECK SURGERY     OVARIAN CYST SURGERY      Family History: Family History  Problem Relation Age of Onset   Stroke Mother    Atrial fibrillation Mother    Heart failure Father      Social History: Social History   Socioeconomic History   Marital status: Single    Spouse name: Not on file   Number of children: Not on file   Years of education: Not on file   Highest education level: Not on file  Occupational History   Not on file  Tobacco Use   Smoking status: Former    Packs/day: 0.50    Types: Cigarettes    Quit date: 01/15/2021    Years since quitting: 0.0   Smokeless tobacco: Never  Vaping Use   Vaping Use: Never used  Substance and Sexual Activity   Alcohol use: Yes    Comment: rarely   Drug use: Never   Sexual activity: Not on file  Other Topics Concern   Not on file  Social History Narrative   Not on file   Social Determinants of Health   Financial Resource Strain: Not on file  Food Insecurity: Not on file  Transportation Needs: Not on file  Physical Activity: Not on file  Stress: Not on file  Social Connections: Not on file    Allergies:  Allergies  Allergen Reactions   Sulfa Antibiotics Itching   Erythromycin Hives   Sulfamethoxazole-Trimethoprim Hives   Tramadol Rash and Other (See Comments)    Urinary retention      Objective:    Vital Signs:   Temp:  [97.9 F (36.6 C)-98.4 F (36.9 C)] 97.9 F (36.6 C) (10/10 0936) Pulse Rate:  [95-110] 95 (10/10 0936) Resp:  [17-20] 20 (10/10 0822) BP: (89-109)/(67-78) 106/68 (10/10 0936) SpO2:  [95 %-98 %] 97 % (10/10 0936) Weight:  [99.3 kg] 99.3 kg (10/10 0502) Last BM Date: 01/22/21  Weight change: Filed Weights   01/21/21 0351 01/22/21 0104 01/23/21 0502  Weight: 98.4 kg 99.2 kg 99.3 kg    Intake/Output:   Intake/Output Summary (Last 24 hours) at 01/23/2021 1140 Last data filed at 01/23/2021 1021 Gross per 24 hour  Intake 1298.58 ml  Output 1250 ml  Net 48.58 ml      Physical Exam    General:  Looks older than stated age  No resp difficulty HEENT: normal Neck: supple. JVP to jaw . Carotids 2+ bilat; no bruits. No lymphadenopathy or thyromegaly  appreciated. Cor: PMI nondisplaced. Regular rate & rhythm. No rubs, gallops or murmurs. Lungs: decreased throughout Abdomen: soft, nontender, nondistended. No hepatosplenomegaly. No bruits or masses. Good bowel sounds. Extremities: no cyanosis, clubbing, rash, edema Neuro: alert & orientedx3, cranial nerves grossly intact. moves all 4 extremities w/o difficulty. Affect pleasant   Telemetry   Sinus 90-100 Personally reviewed   EKG    Sinus tach, 112 bpm  Labs   Basic Metabolic Panel: Recent Labs  Lab 01/20/21 1139 01/21/21 0318 01/21/21 0926 01/22/21 0101 01/23/21 0627  NA 139 136  --  137 136  K  3.8 3.4*  --  4.1 4.1  CL 109 106  --  107 106  CO2 21* 22  --  20* 24  GLUCOSE 109* 117*  --  109* 102*  BUN 13 9  --  14 14  CREATININE 0.64 0.69  --  0.69 0.68  CALCIUM 9.4 8.9  --  8.7* 8.6*  MG  --   --  1.7 1.7 1.8  PHOS  --   --  3.4 3.1 3.6    Liver Function Tests: Recent Labs  Lab 01/21/21 0926 01/22/21 0101 01/23/21 0627  AST 18 18 22   ALT 14 11 22   ALKPHOS 72 68 66  BILITOT 1.1 0.5 0.5  PROT 6.6 6.1* 6.3*  ALBUMIN 3.5 3.3* 3.3*   No results for input(s): LIPASE, AMYLASE in the last 168 hours. No results for input(s): AMMONIA in the last 168 hours.  CBC: Recent Labs  Lab 01/20/21 1139 01/21/21 0318 01/22/21 0101 01/23/21 0418  WBC 11.7* 11.7* 10.0 8.7  NEUTROABS 8.8*  --  5.8 4.9  HGB 13.8 14.3 13.5 13.6  HCT 41.5 42.1 40.8 41.6  MCV 86.8 86.8 87.9 89.7  PLT 349 349 291 298    Cardiac Enzymes: No results for input(s): CKTOTAL, CKMB, CKMBINDEX, TROPONINI in the last 168 hours.  BNP: BNP (last 3 results) Recent Labs    01/20/21 1139  BNP 658.1*    ProBNP (last 3 results) No results for input(s): PROBNP in the last 8760 hours.   CBG: Recent Labs  Lab 01/21/21 2112  GLUCAP 129*    Coagulation Studies: No results for input(s): LABPROT, INR in the last 72 hours.   Imaging   DG CHEST PORT 1 VIEW  Result Date:  01/23/2021 CLINICAL DATA:  Shortness of breath, chest tightness EXAM: PORTABLE CHEST 1 VIEW COMPARISON:  01/22/2021 FINDINGS: Similar interstitial changes. No new consolidation. No pleural effusion. No pneumothorax. Stable normal heart size. IMPRESSION: No significant change. Likely chronic interstitial changes. Minimal superimposed interstitial edema remains possible. Electronically Signed   By: Macy Mis M.D.   On: 01/23/2021 08:48     Medications:     Current Medications:  amphetamine-dextroamphetamine  30 mg Oral BID   cyclobenzaprine  20 mg Oral QHS   guaiFENesin  1,200 mg Oral BID   lidocaine  1 patch Transdermal Q24H   metoprolol succinate  12.5 mg Oral BID   nicotine  21 mg Transdermal Daily   sodium chloride flush  3 mL Intravenous Q12H   sodium chloride flush  3 mL Intravenous Q12H   venlafaxine XR  75 mg Oral Q breakfast   zolpidem  5 mg Oral QHS    Infusions:  sodium chloride     sodium chloride     sodium chloride 50 mL/hr at 01/23/21 0205   heparin 1,550 Units/hr (01/23/21 1121)   magnesium sulfate bolus IVPB 2 g (01/23/21 1040)      Patient Profile   Christine [name] is a 55 year old female with history of ADHD on adderall, depression/anxiety and tobacco use. Now admitted with new onset HFrEF.  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 Awaiting cMRI. ? Tachymediated. Sinus tach noted at prior medical visits, has been on long-term  adderall. Also considered viral myocarditis with known hx COVID-19 infection - Volume up. Continue diuresing with IV lasix 80 mg BID - Continue digoxin 0.125 mg daily - No beta blocker - Starting spiro 12.5 mg daily - Consider  SGLT2i next - BP may limit use of entresto  2. CAD: - Single vessel LAD occlusion on LHC - Continue statin. - No aspirin since anticoagulated for LV apical thrombus  3. Tobacco use: -Cessation advised  4. LV apical thrombus: - Noted on echo.  - Currently on heparin gtt, transition to Eliquis   TOC consult - has no insurance  Length of Stay: 3  FINCH, Marmet, PA-C  01/23/2021, 11:40 AM  Advanced Heart Failure Team Pager [phone] (M-F; 7a - 5p)  Please contact [facility] for night-coverage after hours (4p -7a ) and weekends on [email or web address]   Patient seen and examined with the above-signed Advanced Practice Provider and/or Housestaff. I personally reviewed laboratory data, imaging studies and relevant notes. I independently examined the patient and formulated the important aspects of the plan. I have edited the note to reflect any of my changes or salient points. I have personally discussed the plan with the patient and/or family.  55 y/o smoker with new onset HF. EF 20-25% Cath today with chronic diffusely diseased/occluded mid LAD with elevated filling pressures and moderately depressed output. Hs trop normal   General:  Looks older than stated age  No resp difficulty HEENT: normal Neck: supple. JVP to jaw. Carotids 2+ bilat; no bruits. No lymphadenopathy or thryomegaly appreciated. Cor: PMI nondisplaced. Regular rate & rhythm. No rubs, gallops or murmurs. Lungs: clear decreased throughout Abdomen: soft, nontender, nondistended. No hepatosplenomegaly. No bruits or masses. Good bowel sounds. Extremities: no cyanosis, clubbing, rash, edema Neuro: alert & orientedx3, cranial nerves grossly intact. moves all 4 extremities w/o difficulty. Affect pleasant   She has severe 1v CAD but EF seems depressed out of proportion to cath results. Will get cMRI to further evaluate. Hemodynamics are tenuous. Continue diuresis. Titrate GDMT. If pressures soft will need PICC to manage more  closely.   Glori Bickers, MD  3:33 PM

## 2021-01-23 NOTE — H&P (View-Only) (Signed)
Progress Note  Patient Name: Christine [name] Date of Encounter: 01/23/2021  [facility] Cardiologist: Skeet Latch, MD   Subjective   Denies dyspnea at rest or with light activity.  Has a constant discomfort in her posterior lower right chest that radiates sometimes to the front, worsened by trying to lie flat, not related to breathing or coughing, not worsened by exertion, improved with oxycodone.  Inpatient Medications    Scheduled Meds:  amphetamine-dextroamphetamine  30 mg Oral BID   cyclobenzaprine  20 mg Oral QHS   guaiFENesin  1,200 mg Oral BID   lidocaine  1 patch Transdermal Q24H   metoprolol succinate  12.5 mg Oral BID   nicotine  21 mg Transdermal Daily   sodium chloride flush  3 mL Intravenous Q12H   sodium chloride flush  3 mL Intravenous Q12H   venlafaxine XR  75 mg Oral Q breakfast   zolpidem  5 mg Oral QHS   Continuous Infusions:  sodium chloride     sodium chloride     sodium chloride 50 mL/hr at 01/23/21 0205   heparin 1,550 Units/hr (01/22/21 1943)   PRN Meds: sodium chloride, sodium chloride, acetaminophen, ALPRAZolam, benzonatate, ipratropium-albuterol, ondansetron (ZOFRAN) IV, oxyCODONE, sodium chloride flush, sodium chloride flush   Vital Signs    Vitals:   01/22/21 2050 01/22/21 2050 01/23/21 0502 01/23/21 0822  BP: 97/67 97/67 96/71  (!) 89/78  Pulse: (!) 105 (!) 104 (!) 102 96  Resp:  18 18 20   Temp:  98.4 F (36.9 C) 98 F (36.7 C) 98.4 F (36.9 C)  TempSrc:  Oral Oral Oral  SpO2:  98% 97% 95%  Weight:   99.3 kg   Height:        Intake/Output Summary (Last 24 hours) at 01/23/2021 0936 Last data filed at 01/23/2021 0835 Gross per 24 hour  Intake 1298.58 ml  Output 1550 ml  Net -251.42 ml   Last 3 Weights 01/23/2021 01/22/2021 01/21/2021  Weight (lbs) 219 lb 218 lb 12.8 oz 217 lb  Weight (kg) 99.338 kg 99.247 kg 98.431 kg      Telemetry    Sinus rhythm/mild sinus tachycardia- Personally Reviewed  ECG    Sinus  tachycardia, left atrial abnormality, Q waves in leads II, 3, aVF, V4-V6, nonspecific intraventricular conduction delay, prolonged QT- Personally Reviewed  Physical Exam  Mildly obese, appears comfortable GEN: No acute distress.   Neck: No JVD Cardiac: RRR, no murmurs, rubs, or gallops.  Respiratory: Clear to auscultation bilaterally. GI: Soft, nontender, non-distended  MS: No edema; No deformity. Neuro:  Nonfocal  Psych: Normal affect   Labs    High Sensitivity Troponin:   Recent Labs  Lab 01/20/21 1139 01/20/21 1333  TROPONINIHS 16 18*     Chemistry Recent Labs  Lab 01/21/21 0318 01/21/21 0926 01/22/21 0101 01/23/21 0627  NA 136  --  137 136  K 3.4*  --  4.1 4.1  CL 106  --  107 106  CO2 22  --  20* 24  GLUCOSE 117*  --  109* 102*  BUN 9  --  14 14  CREATININE 0.69  --  0.69 0.68  CALCIUM 8.9  --  8.7* 8.6*  MG  --  1.7 1.7 1.8  PROT  --  6.6 6.1* 6.3*  ALBUMIN  --  3.5 3.3* 3.3*  AST  --  18 18 22   ALT  --  14 11 22   ALKPHOS  --  72 68 66  BILITOT  --  1.1 0.5 0.5  GFRNONAA >60  --  >60 >60  ANIONGAP 8  --  10 6    Lipids No results for input(s): CHOL, TRIG, HDL, LABVLDL, LDLCALC, CHOLHDL in the last 168 hours.  Hematology Recent Labs  Lab 01/21/21 0318 01/22/21 0101 01/23/21 0418  WBC 11.7* 10.0 8.7  RBC 4.85 4.64 4.64  HGB 14.3 13.5 13.6  HCT 42.1 40.8 41.6  MCV 86.8 87.9 89.7  MCH 29.5 29.1 29.3  MCHC 34.0 33.1 32.7  RDW 13.4 13.3 13.5  PLT 349 291 298   Thyroid  Recent Labs  Lab 01/20/21 1814  TSH 1.876  FREET4 1.02    BNP Recent Labs  Lab 01/20/21 1139  BNP 658.1*    DDimer  Recent Labs  Lab 01/20/21 1126  DDIMER 1.26*     Radiology    DG CHEST PORT 1 VIEW  Result Date: 01/23/2021 CLINICAL DATA:  Shortness of breath, chest tightness EXAM: PORTABLE CHEST 1 VIEW COMPARISON:  01/22/2021 FINDINGS: Similar interstitial changes. No new consolidation. No pleural effusion. No pneumothorax. Stable normal heart size. IMPRESSION:  No significant change. Likely chronic interstitial changes. Minimal superimposed interstitial edema remains possible. Electronically Signed   By: Macy Mis M.D.   On: 01/23/2021 08:48   DG CHEST PORT 1 VIEW  Result Date: 01/22/2021 CLINICAL DATA:  55 year old female with a history of shortness of breath EXAM: PORTABLE CHEST 1 VIEW COMPARISON:  01/21/2021 FINDINGS: Cardiomediastinal silhouette unchanged in size and contour. Interlobular septal thickening persists. No new confluent airspace disease. Mild coarsened interstitial markings. No acute displaced fracture. Degenerative changes of the spine. IMPRESSION: Persisting interlobular septal thickening, suggesting persisting edema, with no new confluent airspace disease. Electronically Signed   By: Corrie Mckusick D.O.   On: 01/22/2021 12:43   ECHOCARDIOGRAM COMPLETE  Result Date: 01/21/2021    ECHOCARDIOGRAM REPORT   Patient Name:   Christine [name] Date of Exam: 01/21/2021 Medical Rec #:  [id number]       Height:       70.0 in Accession #:    [id number]      Weight:       217.0 lb Date of Birth:  1965-06-27       BSA:          2.161 m Patient Age:    89 years        BP:           109/70 mmHg Patient Gender: F               HR:           99 bpm. Exam Location:  Inpatient Procedure: 2D Echo, Cardiac Doppler, Color Doppler and Intracardiac            Opacification Agent  Discussed results with Dr Alfredia Ferguson at 16:05 on 01/21/21. Indications:    CHF-Acute Systolic Q94.50  History:        Patient has no prior history of Echocardiogram examinations.                 Signs/Symptoms:Shortness of Breath.  Sonographer:    Bernadene Person RDCS Referring Phys: [id number] [place]  1. Left ventricular ejection fraction, by estimation, is 20 to 25%. The left ventricle has severely decreased function. The left ventricle demonstrates global hypokinesis. The left ventricular internal cavity size was severely dilated. Left ventricular diastolic parameters are  indeterminate.  2. LV apical mural thrombus (best seen images 109-110)  3. Right ventricular systolic  function is normal. The right ventricular size is normal. There is normal pulmonary artery systolic pressure.  4. The mitral valve is abnormal. Moderate mitral valve regurgitation. Appears functional.  5. The aortic valve was not well visualized. Aortic valve regurgitation is trivial. No aortic stenosis is present.  6. Aortic dilatation noted. There is mild dilatation of the ascending aorta, measuring 37 mm.  7. The inferior vena cava is normal in size with <50% respiratory variability, suggesting right atrial pressure of 8 mmHg. FINDINGS  Left Ventricle: Left ventricular ejection fraction, by estimation, is 20 to 25%. The left ventricle has severely decreased function. The left ventricle demonstrates global hypokinesis. Definity contrast agent was given IV to delineate the left ventricular endocardial borders. The left ventricular internal cavity size was severely dilated. There is no left ventricular hypertrophy. Left ventricular diastolic parameters are indeterminate. Right Ventricle: The right ventricular size is normal. No increase in right ventricular wall thickness. Right ventricular systolic function is normal. There is normal pulmonary artery systolic pressure. The tricuspid regurgitant velocity is 1.80 m/s, and  with an assumed right atrial pressure of 8 mmHg, the estimated right ventricular systolic pressure is 25.9 mmHg. Left Atrium: Left atrial size was normal in size. Right Atrium: Right atrial size was normal in size. Pericardium: There is no evidence of pericardial effusion. Mitral Valve: The mitral valve is abnormal. Moderate mitral valve regurgitation. Tricuspid Valve: The tricuspid valve is normal in structure. Tricuspid valve regurgitation is trivial. Aortic Valve: The aortic valve was not well visualized. Aortic valve regurgitation is trivial. No aortic stenosis is present. Pulmonic Valve: The  pulmonic valve was grossly normal. Pulmonic valve regurgitation is not visualized. Aorta: The aortic root is normal in size and structure and aortic dilatation noted. There is mild dilatation of the ascending aorta, measuring 37 mm. Venous: The inferior vena cava is normal in size with less than 50% respiratory variability, suggesting right atrial pressure of 8 mmHg. IAS/Shunts: The interatrial septum was not well visualized.  LEFT VENTRICLE PLAX 2D LVIDd:         6.40 cm      Diastology LVIDs:         5.80 cm      LV e' medial:    7.20 cm/s LV PW:         0.90 cm      LV E/e' medial:  24.0 LV IVS:        0.90 cm      LV e' lateral:   10.60 cm/s LVOT diam:     2.10 cm      LV E/e' lateral: 16.3 LV SV:         43 LV SV Index:   20 LVOT Area:     3.46 cm  LV Volumes (MOD) LV vol d, MOD A2C: 272.0 ml LV vol d, MOD A4C: 211.0 ml LV vol s, MOD A2C: 202.0 ml LV vol s, MOD A4C: 166.0 ml LV SV MOD A2C:     70.0 ml LV SV MOD A4C:     211.0 ml LV SV MOD BP:      56.2 ml RIGHT VENTRICLE RV S prime:     9.20 cm/s TAPSE (M-mode): 1.5 cm LEFT ATRIUM             Index        RIGHT ATRIUM           Index LA diam:        3.10 cm 1.43 cm/m  RA Area:     12.30 cm LA Vol (A2C):   66.5 ml 30.77 ml/m  RA Volume:   28.40 ml  13.14 ml/m LA Vol (A4C):   53.1 ml 24.57 ml/m LA Biplane Vol: 62.3 ml 28.83 ml/m  AORTIC VALVE LVOT Vmax:   88.83 cm/s LVOT Vmean:  56.267 cm/s LVOT VTI:    0.123 m  AORTA Ao Root diam: 3.60 cm Ao Asc diam:  3.70 cm MITRAL VALVE                  TRICUSPID VALVE MV Area (PHT): 6.96 cm       TR Peak grad:   13.0 mmHg MV Decel Time: 109 msec       TR Vmax:        180.00 cm/s MR Peak grad:    76.7 mmHg MR Mean grad:    49.0 mmHg    SHUNTS MR Vmax:         438.00 cm/s  Systemic VTI:  0.12 m MR Vmean:        326.5 cm/s   Systemic Diam: 2.10 cm MR PISA:         2.26 cm MR PISA Eff ROA: 20 mm MR PISA Radius:  0.60 cm MV E velocity: 173.00 cm/s MV A velocity: 76.50 cm/s MV E/A ratio:  2.26 Oswaldo Milian MD  Electronically signed by Oswaldo Milian MD Signature Date/Time: 01/21/2021/4:09:31 PM    Final     Cardiac Studies   ECHO: 01/21/2021  1. Left ventricular ejection fraction, by estimation, is 20 to 25%. The  left ventricle has severely decreased function. The left ventricle  demonstrates global hypokinesis. The left ventricular internal cavity size  was severely dilated. Left ventricular diastolic parameters are indeterminate.   2. LV apical mural thrombus (best seen images 109-110)   3. Right ventricular systolic function is normal. The right ventricular  size is normal. There is normal pulmonary artery systolic pressure.   4. The mitral valve is abnormal. Moderate mitral valve regurgitation.  Appears functional.   5. The aortic valve was not well visualized. Aortic valve regurgitation  is trivial. No aortic stenosis is present.   6. Aortic dilatation noted. There is mild dilatation of the ascending  aorta, measuring 37 mm.   7. The inferior vena cava is normal in size with <50% respiratory  variability, suggesting right atrial pressure of 8 mmHg.   Patient Profile     55 y.o. female smoker without other known chronic medical conditions presenting with acute pulmonary edema secondary to combined systolic and diastolic heart failure, LVEF 20-25%, LV apical mural thrombus, improved after intravenous diuretics  Assessment & Plan    Plan for cardiac catheterization with coronary angiography as well as measurement of right heart pressures today. After cardiac catheterization is completed, transition from intravenous heparin to warfarin (anticipate discharge with enoxaparin bridge, but will have to figure out the financial implications).   Low systemic blood pressure limits the use of typical guideline directed medical therapy for cardiomyopathy/heart failure.  We could use an SGLT2 inhibitor but again have to figure out how we will provide this. If no CAD on cath, cMRI will be  useful. Does not have medical insurance. Will engage the advanced heart failure service, transition of care team and social worker.      For questions or updates, please contact [place] Please consult [email or web address] for contact info under        Signed, Sanda Klein, MD  01/23/2021, 9:36 AM

## 2021-01-23 NOTE — Progress Notes (Signed)
PROGRESS NOTE    Christine [name]  HGD:[id number] DOB: 12-Jan-1966 DOA: 01/20/2021 PCP: Pcp, No   Brief Narrative: The patient is a 55 year old obese female with a past medical history significant for but not limited to anxiety, depression, ADHD, history of chronic tobacco abuse and smoking who presented with a new onset of cough and shortness of breath.  She states that symptoms started last Sunday when she started to develop a dry cough and started to feel short of breath and tightness in her chest as well as "all around her rib cage like a belt around her chest and I could not take a deep breath".  She denied any fevers or chills that she had bronchitis and did not use any over-the-counter medications.  When asked few days her symptoms became worse with minimal activity triggering shortness of breath and cough.  She also states that she felt palpitations yesterday.  At baseline she had severe anxiety and needed to take Xanax 1 mg every 6-8 hours otherwise she would feel severe palpitations.  She already had COVID-19 infection in July 2022 and reports that she has had frequent night sweats after but no weight loss or diarrhea.  She went to urgent care because of her severe shortness of breath and palpitations in urgent care suspected new onset CHF and sent the patient to the ED.  She states that she was not able to smoke her cigarettes due to her shortness of breath.  In the ED she had an EKG done which showed sinus tachycardia.  She had CT of the chest which was negative for PE but did show bilateral peripheral infiltrates suspicious for viral pneumonia versus CHF.  She also had mild bilateral pleural effusions and CBC in BMP were largely within normal limits but BNP was elevated. Further workup was done and ECHOCardiogram done and showed an EF of 20-25%. Cardiology to give her another dose of Lasix (received 20 mg initially) and planning for Cardiac Catheterization. Cardiology to also increase BB dosing to  BID. Currently on a Heparin gtt for Mural LV Apical Thrombus found on ECHO.  Will repeat CXR given diminished breath sounds on the Right and add Oxycodone for Back Pain.  Oxycodone is helping her back pain however was not given today given her hypotension.  Patient is to undergo cardiac catheterization today and cardiology feels that she may have some familial cardiomyopathy if coronary disease is excluded.  Post catheterization cardiology is recommending starting warfarin given her left ventricular mural thrombus in the apex and they also feel that catheterization would be beneficial to proceed with cardiac MRI To confirm the limits but also exclude other etiologies of cardiomyopathy.  Assessment & Plan:   Active Problems:   CHF (congestive heart failure) (HCC)   Anxiety   Tobacco abuse   Shortness of breath   Chest tightness  New onset Acute Systolic CHF with EF of 96-22% -Clinically the patient appeared to have mild fluid overload with bilateral crackles -She had an elevated D-Dimer of 1.26 so obtained CTA of the chest which showed "No evidence of acute pulmonary embolism or other acute vascular findings in the chest. Patchy ground-glass opacities in both lungs with nodular components in the upper lobes, diffuse central airway and interstitial thickening and small bilateral pleural effusions. Findings are nonspecific, but suspicious for viral infection (consider COVID-19 infection). Recommend radiographic follow-up. Mild Aortic Atherosclerosis" -Repeat CXR yesterday done due to diminished breath sounds and Dyspnea and showed "Persisting interlobular septal thickening, suggesting persisting edema,  with no new confluent airspace disease." -BNP elevated at 658.1 on admission; Today has some JVD and Some rales and Crackles noted  -She is given a dose of IV Lasix 20 mg the day before yesterday and will be giving her IV Lasix 20 mg x1 today  -Cardiology was consulted and appreciate further  evaluation and reccs -Echocardiogram done and showed EF of 20-25% with severely decreased LV Fxn and the Left Ventricle demonstarted global hypokinesis and the Left Ventricle Internal Cavity was severely dilated with the LV Diastolic parameters being indeterminate. She also had an LV Apical Mural Thrombus and moderate mitral valve regurgitation -We will need strict I's and O's and daily weights; She is -800.5 mL  -She feels as if she has some Urinary Retention so will need to Monitor Bladder Scans q8h and In and Out Cath for >350 mL -She had a unclear etiology but will need to rule out thyroid disorders and will check a TSH, T4 and T3; TSH was 1.876 and T4 was 1.02 and T3 was 3.0' Also checking HbA1c based on Risk factors and she is Pre-diabetic with a HbA1c of 5.8 -She may need an ischemic workup based on her Severely diminished EF -Started on Metoprolol Succinate 12.5 mg po Daily and Cardiology increasing dosing today to BID  -Other differential diagnosis could include COPD but no history of COPD in the past despite her smoking.  We will recommend outpatient pulmonary follow-up for formal PFT testing -Further care per Cardiology and plan is for Cardiac Cath today and Cardiac MRI tomorrow   LV Apical Mural Thrombus -Seen on ECHO -Unclear Etiology -Started Anticoagulation with Heparin gtt and Cardiology planning on transitioning to Warfarin after Cath   Chest Pain/Discomfort -Symptoms are Atypical  -Troponin went from 16 -> 18 -Cardiology recommending considering ischemic evaluation based on ECHO and she is getting a Cardiac Cath in the AM given her EF of 20-25% -CP also occurs when laying down so ? GERD -Started the patient on Metoprolol Succinate 12.5 mg po Daily and Cardiology to increase this today to BID dosing -May try some Nitro paste but defer to Cardiology to initiate given her lower BP -Repeat CXR this AM done showed "No significant change. Likely chronic interstitial changes. Minimal  superimposed interstitial edema remains possible."  Back Pain, intermittent  -Developed after her Lasix  -Improved with acetaminophen but then happened again and has been more constant -We will try lidocaine patch -CTA of the chest showed "No significant systemic arterial abnormalities are identified, although there is limited opacification of the aorta. Minimal aortic and coronary artery atherosclerosis. The heart size is normal. There is no pericardial effusion." As well as "There is no chest wall mass or suspicious osseous finding. Mild degenerative changes in the spine." -Started Oxycodone with improvement   Sinus Tachycardia -No PE on Exam -TSH normal; Could be a result of her Amphetamine for her ADHD -Given 500 mL bolus on Admission and will hold further Fluid administration -HR ranging from 100-118 and now improved  -C/w low dose Metoprolol Succinate 12.5 mg po Daily   ? Viral PNA -CTA finidings as above -Patient had COVID infection in July and tested Negative here -C/w Supportive Care and c/w Guaifenesin 1200 mg po BID and Benzonatate 200 mg po TIDprn Cough   -Also Added Flutter Valve and Incentive Spirometry  -C/w DuoNeb 3 mL q6hprn Wheezing and SOB -Repeat Imaging and follow for Radiographic Improvement; Obtaining CXR today  -SpO2: 97 % -She will an Ambulatory Home O2  screen prior to D/C  -Repeat CXR yesterday AM showed "The heart size and mediastinal contours are stable. Both lungs are clear. Minimal linear atelectasis of both lung bases are identified. The visualized skeletal structures are unremarkable." -Repeat CXR this AM as above   Hypokalemia -Mild. K+ went from 3.8 -> 3.4 -> 4.1 x2 -Check Mag Level and was 1.7 so will replete with IV Mag Sulfate 2 grams -Continue to Monitor and Repelet as Necessary   Leukocytosis, improved   -WBC went from 11.7 -> 11.7 -> 10.0 -> 8.7 -PCT went from <0.10 -> 0.45 -> <0.10 -Continue to Monitor for S/Sx of Infection -Repeat CBC in  the AM   Anxiety and Depression -C/w Venlafaxine XR 75 mg po Daily and Alprazolam 1 mg po TIDprn Anxiety   ADHD -C/w Amphetamine-Dextroamphetamine 30 mg po BID  Tobacco abuse and Cigarette Smoker -Smoking cessation counseling -Continue with Nicotine Patch 21 mg TD  Obesity -Complicates overall prognosis and care -Estimated body mass index is 31.42 kg/m as calculated from the following:   Height as of this encounter: 5' 10"  (1.778 m).   Weight as of this encounter: 99.3 kg. -Weight Loss and Dietary Counseling given   DVT prophylaxis: Enoxaparin 40 mg sq q24h changed to Heparin gtt given LV Apical Mural Thrombus  Code Status: FULL CODE  Family Communication: No family currently present at bedside Disposition Plan: We will need to have cardiac clearance prior to discharge  Status is: Inpatient  Remains inpatient appropriate because:Unsafe d/c plan, IV treatments appropriate due to intensity of illness or inability to take PO, and Inpatient level of care appropriate due to severity of illness  Dispo: The patient is from: Home              Anticipated d/c is to: Home              Patient currently is not medically stable to d/c.   Difficult to place patient No  Consultants:  Cardiology   Procedures:  ECHOCARDIOGRAM IMPRESSIONS     1. Left ventricular ejection fraction, by estimation, is 20 to 25%. The  left ventricle has severely decreased function. The left ventricle  demonstrates global hypokinesis. The left ventricular internal cavity size  was severely dilated. Left ventricular  diastolic parameters are indeterminate.   2. LV apical mural thrombus (best seen images 109-110)   3. Right ventricular systolic function is normal. The right ventricular  size is normal. There is normal pulmonary artery systolic pressure.   4. The mitral valve is abnormal. Moderate mitral valve regurgitation.  Appears functional.   5. The aortic valve was not well visualized. Aortic valve  regurgitation  is trivial. No aortic stenosis is present.   6. Aortic dilatation noted. There is mild dilatation of the ascending  aorta, measuring 37 mm.   7. The inferior vena cava is normal in size with <50% respiratory  variability, suggesting right atrial pressure of 8 mmHg.   FINDINGS   Left Ventricle: Left ventricular ejection fraction, by estimation, is 20  to 25%. The left ventricle has severely decreased function. The left  ventricle demonstrates global hypokinesis. Definity contrast agent was  given IV to delineate the left  ventricular endocardial borders. The left ventricular internal cavity size  was severely dilated. There is no left ventricular hypertrophy. Left  ventricular diastolic parameters are indeterminate.   Right Ventricle: The right ventricular size is normal. No increase in  right ventricular wall thickness. Right ventricular systolic function is  normal.  There is normal pulmonary artery systolic pressure. The tricuspid  regurgitant velocity is 1.80 m/s, and   with an assumed right atrial pressure of 8 mmHg, the estimated right  ventricular systolic pressure is 70.9 mmHg.   Left Atrium: Left atrial size was normal in size.   Right Atrium: Right atrial size was normal in size.   Pericardium: There is no evidence of pericardial effusion.   Mitral Valve: The mitral valve is abnormal. Moderate mitral valve  regurgitation.   Tricuspid Valve: The tricuspid valve is normal in structure. Tricuspid  valve regurgitation is trivial.   Aortic Valve: The aortic valve was not well visualized. Aortic valve  regurgitation is trivial. No aortic stenosis is present.   Pulmonic Valve: The pulmonic valve was grossly normal. Pulmonic valve  regurgitation is not visualized.   Aorta: The aortic root is normal in size and structure and aortic  dilatation noted. There is mild dilatation of the ascending aorta,  measuring 37 mm.   Venous: The inferior vena cava is normal  in size with less than 50%  respiratory variability, suggesting right atrial pressure of 8 mmHg.   IAS/Shunts: The interatrial septum was not well visualized.      LEFT VENTRICLE  PLAX 2D  LVIDd:         6.40 cm      Diastology  LVIDs:         5.80 cm      LV e' medial:    7.20 cm/s  LV PW:         0.90 cm      LV E/e' medial:  24.0  LV IVS:        0.90 cm      LV e' lateral:   10.60 cm/s  LVOT diam:     2.10 cm      LV E/e' lateral: 16.3  LV SV:         43  LV SV Index:   20  LVOT Area:     3.46 cm     LV Volumes (MOD)  LV vol d, MOD A2C: 272.0 ml  LV vol d, MOD A4C: 211.0 ml  LV vol s, MOD A2C: 202.0 ml  LV vol s, MOD A4C: 166.0 ml  LV SV MOD A2C:     70.0 ml  LV SV MOD A4C:     211.0 ml  LV SV MOD BP:      56.2 ml   RIGHT VENTRICLE  RV S prime:     9.20 cm/s  TAPSE (M-mode): 1.5 cm   LEFT ATRIUM             Index        RIGHT ATRIUM           Index  LA diam:        3.10 cm 1.43 cm/m   RA Area:     12.30 cm  LA Vol (A2C):   66.5 ml 30.77 ml/m  RA Volume:   28.40 ml  13.14 ml/m  LA Vol (A4C):   53.1 ml 24.57 ml/m  LA Biplane Vol: 62.3 ml 28.83 ml/m   AORTIC VALVE  LVOT Vmax:   88.83 cm/s  LVOT Vmean:  56.267 cm/s  LVOT VTI:    0.123 m     AORTA  Ao Root diam: 3.60 cm  Ao Asc diam:  3.70 cm   MITRAL VALVE  TRICUSPID VALVE  MV Area (PHT): 6.96 cm       TR Peak grad:   13.0 mmHg  MV Decel Time: 109 msec       TR Vmax:        180.00 cm/s  MR Peak grad:    76.7 mmHg  MR Mean grad:    49.0 mmHg    SHUNTS  MR Vmax:         438.00 cm/s  Systemic VTI:  0.12 m  MR Vmean:        326.5 cm/s   Systemic Diam: 2.10 cm  MR PISA:         2.26 cm  MR PISA Eff ROA: 20 mm  MR PISA Radius:  0.60 cm  MV E velocity: 173.00 cm/s  MV A velocity: 76.50 cm/s  MV E/A ratio:  2.26   CARDIAC CATH to be Done   Antimicrobials:  Anti-infectives (From admission, onward)    None       Subjective: Seen and examined at bedside and states that her shortness of  breath is improved after Lasix.  Continues to have some back pain and states that oxycodone helps for about 39 hours and then recurs.  States that she does not have back pain usually and this developed while she was in the hospital.  Understand she will be going for cardiac cath.  No lightheadedness or dizziness..  No other concerns or complaints this time.  Objective: Vitals:   01/22/21 2050 01/23/21 0502 01/23/21 0822 01/23/21 0936  BP:  96/71 (!) 89/78 106/68  Pulse: (!) 104 (!) 102 96 95  Resp: 18 18 20    Temp:  98 F (36.7 C) 98.4 F (36.9 C) 97.9 F (36.6 C)  TempSrc: Oral Oral Oral Oral  SpO2: 98% 97% 95% 97%  Weight:  99.3 kg    Height:        Intake/Output Summary (Last 24 hours) at 01/23/2021 1242 Last data filed at 01/23/2021 1021 Gross per 24 hour  Intake 1118.34 ml  Output 1250 ml  Net -131.66 ml    Filed Weights   01/21/21 0351 01/22/21 0104 01/23/21 0502  Weight: 98.4 kg 99.2 kg 99.3 kg   Examination: Physical Exam:  Constitutional: WN/WD obese Caucasian female currently in NAD and appears calm but a little uncomfortable with her back pain Eyes: Lids and conjunctivae normal, sclerae anicteric  ENMT: External Ears, Nose appear normal. Grossly normal hearing. Mucous membranes are moist.  Neck: Appears normal, supple, no cervical masses, normal ROM, no appreciable thyromegaly; mild appreciable JVD Respiratory: Diminished to auscultation bilaterally with coarse breath sound, no wheezing, rales, rhonchi or crackles. Normal respiratory effort and patient is not tachypenic. No accessory muscle use.  Unlabored breathing Cardiovascular: RRR, no murmurs / rubs / gallops. S1 and S2 auscultated.  Slight lower extremity edema Abdomen: Soft, non-tender, distended secondary body habitus. Bowel sounds positive.  GU: Deferred. Musculoskeletal: No clubbing / cyanosis of digits/nails. No joint deformity upper and lower extremities.  Skin: No rashes, lesions, ulcers on limited  skin evaluation. No induration; Warm and dry.  Neurologic: CN 2-12 grossly intact with no focal deficits.  Romberg sign and cerebellar reflexes not assessed.  Psychiatric: Normal judgment and insight. Alert and oriented x 3. Normal mood and appropriate affect.   Data Reviewed: I have personally reviewed following labs and imaging studies  CBC: Recent Labs  Lab 01/20/21 1139 01/21/21 0318 01/22/21 0101 01/23/21 0418  WBC 11.7* 11.7* 10.0 8.7  NEUTROABS  8.8*  --  5.8 4.9  HGB 13.8 14.3 13.5 13.6  HCT 41.5 42.1 40.8 41.6  MCV 86.8 86.8 87.9 89.7  PLT 349 349 291 081    Basic Metabolic Panel: Recent Labs  Lab 01/20/21 1139 01/21/21 0318 01/21/21 0926 01/22/21 0101 01/23/21 0627  NA 139 136  --  137 136  K 3.8 3.4*  --  4.1 4.1  CL 109 106  --  107 106  CO2 21* 22  --  20* 24  GLUCOSE 109* 117*  --  109* 102*  BUN 13 9  --  14 14  CREATININE 0.64 0.69  --  0.69 0.68  CALCIUM 9.4 8.9  --  8.7* 8.6*  MG  --   --  1.7 1.7 1.8  PHOS  --   --  3.4 3.1 3.6    GFR: Estimated Creatinine Clearance: 103.7 mL/min (by C-G formula based on SCr of 0.68 mg/dL). Liver Function Tests: Recent Labs  Lab 01/21/21 0926 01/22/21 0101 01/23/21 0627  AST 18 18 22   ALT 14 11 22   ALKPHOS 72 68 66  BILITOT 1.1 0.5 0.5  PROT 6.6 6.1* 6.3*  ALBUMIN 3.5 3.3* 3.3*    No results for input(s): LIPASE, AMYLASE in the last 168 hours. No results for input(s): AMMONIA in the last 168 hours. Coagulation Profile: No results for input(s): INR, PROTIME in the last 168 hours. Cardiac Enzymes: No results for input(s): CKTOTAL, CKMB, CKMBINDEX, TROPONINI in the last 168 hours. BNP (last 3 results) No results for input(s): PROBNP in the last 8760 hours. HbA1C: Recent Labs    01/23/21 0418  HGBA1C 5.8*   CBG: Recent Labs  Lab 01/21/21 2112  GLUCAP 129*    Lipid Profile: No results for input(s): CHOL, HDL, LDLCALC, TRIG, CHOLHDL, LDLDIRECT in the last 72 hours. Thyroid Function  Tests: Recent Labs    01/20/21 1814 01/21/21 0318  TSH 1.876  --   FREET4 1.02  --   T3FREE  --  3.0    Anemia Panel: No results for input(s): VITAMINB12, FOLATE, FERRITIN, TIBC, IRON, RETICCTPCT in the last 72 hours. Sepsis Labs: Recent Labs  Lab 01/20/21 2000 01/21/21 0318 01/22/21 0101  PROCALCITON <0.10 0.45 <0.10     Recent Results (from the past 240 hour(s))  Resp Panel by RT-PCR (Flu A&B, Covid) Nasopharyngeal Swab     Status: None   Collection Time: 01/20/21  3:20 PM   Specimen: Nasopharyngeal Swab; Nasopharyngeal(NP) swabs in vial transport medium  Result Value Ref Range Status   SARS Coronavirus 2 by RT PCR NEGATIVE NEGATIVE Final    Comment: (NOTE) SARS-CoV-2 target nucleic acids are NOT DETECTED.  The SARS-CoV-2 RNA is generally detectable in upper respiratory specimens during the acute phase of infection. The lowest concentration of SARS-CoV-2 viral copies this assay can detect is 138 copies/mL. A negative result does not preclude SARS-Cov-2 infection and should not be used as the sole basis for treatment or other patient management decisions. A negative result may occur with  improper specimen collection/handling, submission of specimen other than nasopharyngeal swab, presence of viral mutation(s) within the areas targeted by this assay, and inadequate number of viral copies(<138 copies/mL). A negative result must be combined with clinical observations, patient history, and epidemiological information. The expected result is Negative.  Fact Sheet for Patients:  EntrepreneurPulse.com.au  Fact Sheet for Healthcare Providers:  IncredibleEmployment.be  This test is no t yet approved or cleared by the [place] FDA and  has been  authorized for detection and/or diagnosis of SARS-CoV-2 by FDA under an Emergency Use Authorization (EUA). This EUA will remain  in effect (meaning this test can be used) for the duration of  the COVID-19 declaration under Section 564(b)(1) of the Act, 21 U.S.C.section 360bbb-3(b)(1), unless the authorization is terminated  or revoked sooner.       Influenza A by PCR NEGATIVE NEGATIVE Final   Influenza B by PCR NEGATIVE NEGATIVE Final    Comment: (NOTE) The Xpert Xpress SARS-CoV-2/FLU/RSV plus assay is intended as an aid in the diagnosis of influenza from Nasopharyngeal swab specimens and should not be used as a sole basis for treatment. Nasal washings and aspirates are unacceptable for Xpert Xpress SARS-CoV-2/FLU/RSV testing.  Fact Sheet for Patients: EntrepreneurPulse.com.au  Fact Sheet for Healthcare Providers: IncredibleEmployment.be  This test is not yet approved or cleared by the [place] FDA and has been authorized for detection and/or diagnosis of SARS-CoV-2 by FDA under an Emergency Use Authorization (EUA). This EUA will remain in effect (meaning this test can be used) for the duration of the COVID-19 declaration under Section 564(b)(1) of the Act, 21 U.S.C. section 360bbb-3(b)(1), unless the authorization is terminated or revoked.  Performed at [facility], [address] [place], [place] [address]      RN Pressure Injury Documentation:     Estimated body mass index is 31.42 kg/m as calculated from the following:   Height as of this encounter: 5' 10"  (1.778 m).   Weight as of this encounter: 99.3 kg.  Malnutrition Type:   Malnutrition Characteristics:  Nutrition Interventions:    Radiology Studies: DG CHEST PORT 1 VIEW  Result Date: 01/23/2021 CLINICAL DATA:  Shortness of breath, chest tightness EXAM: PORTABLE CHEST 1 VIEW COMPARISON:  01/22/2021 FINDINGS: Similar interstitial changes. No new consolidation. No pleural effusion. No pneumothorax. Stable normal heart size. IMPRESSION: No significant change. Likely chronic interstitial changes. Minimal superimposed interstitial edema  remains possible. Electronically Signed   By: Macy Mis M.D.   On: 01/23/2021 08:48   DG CHEST PORT 1 VIEW  Result Date: 01/22/2021 CLINICAL DATA:  55 year old female with a history of shortness of breath EXAM: PORTABLE CHEST 1 VIEW COMPARISON:  01/21/2021 FINDINGS: Cardiomediastinal silhouette unchanged in size and contour. Interlobular septal thickening persists. No new confluent airspace disease. Mild coarsened interstitial markings. No acute displaced fracture. Degenerative changes of the spine. IMPRESSION: Persisting interlobular septal thickening, suggesting persisting edema, with no new confluent airspace disease. Electronically Signed   By: Corrie Mckusick D.O.   On: 01/22/2021 12:43   ECHOCARDIOGRAM COMPLETE  Result Date: 01/21/2021    ECHOCARDIOGRAM REPORT   Patient Name:   Christine [name] Date of Exam: 01/21/2021 Medical Rec #:  [id number]       Height:       70.0 in Accession #:    [id number]      Weight:       217.0 lb Date of Birth:  12/13/1965       BSA:          2.161 m Patient Age:    3 years        BP:           109/70 mmHg Patient Gender: F               HR:           99 bpm. Exam Location:  Inpatient Procedure: 2D Echo, Cardiac Doppler, Color Doppler and Intracardiac  Opacification Agent  Discussed results with Dr Alfredia Ferguson at 16:05 on 01/21/21. Indications:    CHF-Acute Systolic G86.76  History:        Patient has no prior history of Echocardiogram examinations.                 Signs/Symptoms:Shortness of Breath.  Sonographer:    Bernadene Person RDCS Referring Phys: [id number] [place]  1. Left ventricular ejection fraction, by estimation, is 20 to 25%. The left ventricle has severely decreased function. The left ventricle demonstrates global hypokinesis. The left ventricular internal cavity size was severely dilated. Left ventricular diastolic parameters are indeterminate.  2. LV apical mural thrombus (best seen images 109-110)  3. Right ventricular systolic function  is normal. The right ventricular size is normal. There is normal pulmonary artery systolic pressure.  4. The mitral valve is abnormal. Moderate mitral valve regurgitation. Appears functional.  5. The aortic valve was not well visualized. Aortic valve regurgitation is trivial. No aortic stenosis is present.  6. Aortic dilatation noted. There is mild dilatation of the ascending aorta, measuring 37 mm.  7. The inferior vena cava is normal in size with <50% respiratory variability, suggesting right atrial pressure of 8 mmHg. FINDINGS  Left Ventricle: Left ventricular ejection fraction, by estimation, is 20 to 25%. The left ventricle has severely decreased function. The left ventricle demonstrates global hypokinesis. Definity contrast agent was given IV to delineate the left ventricular endocardial borders. The left ventricular internal cavity size was severely dilated. There is no left ventricular hypertrophy. Left ventricular diastolic parameters are indeterminate. Right Ventricle: The right ventricular size is normal. No increase in right ventricular wall thickness. Right ventricular systolic function is normal. There is normal pulmonary artery systolic pressure. The tricuspid regurgitant velocity is 1.80 m/s, and  with an assumed right atrial pressure of 8 mmHg, the estimated right ventricular systolic pressure is 67.1 mmHg. Left Atrium: Left atrial size was normal in size. Right Atrium: Right atrial size was normal in size. Pericardium: There is no evidence of pericardial effusion. Mitral Valve: The mitral valve is abnormal. Moderate mitral valve regurgitation. Tricuspid Valve: The tricuspid valve is normal in structure. Tricuspid valve regurgitation is trivial. Aortic Valve: The aortic valve was not well visualized. Aortic valve regurgitation is trivial. No aortic stenosis is present. Pulmonic Valve: The pulmonic valve was grossly normal. Pulmonic valve regurgitation is not visualized. Aorta: The aortic root is  normal in size and structure and aortic dilatation noted. There is mild dilatation of the ascending aorta, measuring 37 mm. Venous: The inferior vena cava is normal in size with less than 50% respiratory variability, suggesting right atrial pressure of 8 mmHg. IAS/Shunts: The interatrial septum was not well visualized.  LEFT VENTRICLE PLAX 2D LVIDd:         6.40 cm      Diastology LVIDs:         5.80 cm      LV e' medial:    7.20 cm/s LV PW:         0.90 cm      LV E/e' medial:  24.0 LV IVS:        0.90 cm      LV e' lateral:   10.60 cm/s LVOT diam:     2.10 cm      LV E/e' lateral: 16.3 LV SV:         43 LV SV Index:   20 LVOT Area:     3.46 cm  LV Volumes (MOD) LV vol d, MOD A2C: 272.0 ml LV vol d, MOD A4C: 211.0 ml LV vol s, MOD A2C: 202.0 ml LV vol s, MOD A4C: 166.0 ml LV SV MOD A2C:     70.0 ml LV SV MOD A4C:     211.0 ml LV SV MOD BP:      56.2 ml RIGHT VENTRICLE RV S prime:     9.20 cm/s TAPSE (M-mode): 1.5 cm LEFT ATRIUM             Index        RIGHT ATRIUM           Index LA diam:        3.10 cm 1.43 cm/m   RA Area:     12.30 cm LA Vol (A2C):   66.5 ml 30.77 ml/m  RA Volume:   28.40 ml  13.14 ml/m LA Vol (A4C):   53.1 ml 24.57 ml/m LA Biplane Vol: 62.3 ml 28.83 ml/m  AORTIC VALVE LVOT Vmax:   88.83 cm/s LVOT Vmean:  56.267 cm/s LVOT VTI:    0.123 m  AORTA Ao Root diam: 3.60 cm Ao Asc diam:  3.70 cm MITRAL VALVE                  TRICUSPID VALVE MV Area (PHT): 6.96 cm       TR Peak grad:   13.0 mmHg MV Decel Time: 109 msec       TR Vmax:        180.00 cm/s MR Peak grad:    76.7 mmHg MR Mean grad:    49.0 mmHg    SHUNTS MR Vmax:         438.00 cm/s  Systemic VTI:  0.12 m MR Vmean:        326.5 cm/s   Systemic Diam: 2.10 cm MR PISA:         2.26 cm MR PISA Eff ROA: 20 mm MR PISA Radius:  0.60 cm MV E velocity: 173.00 cm/s MV A velocity: 76.50 cm/s MV E/A ratio:  2.26 Oswaldo Milian MD Electronically signed by Oswaldo Milian MD Signature Date/Time: 01/21/2021/4:09:31 PM    Final      Scheduled Meds:  amphetamine-dextroamphetamine  30 mg Oral BID   cyclobenzaprine  20 mg Oral QHS   guaiFENesin  1,200 mg Oral BID   lidocaine  1 patch Transdermal Q24H   metoprolol succinate  12.5 mg Oral BID   nicotine  21 mg Transdermal Daily   sodium chloride flush  3 mL Intravenous Q12H   sodium chloride flush  3 mL Intravenous Q12H   venlafaxine XR  75 mg Oral Q breakfast   zolpidem  5 mg Oral QHS   Continuous Infusions:  sodium chloride     sodium chloride     sodium chloride 50 mL/hr at 01/23/21 0205   heparin 1,550 Units/hr (01/23/21 1121)    LOS: 3 days   Kerney Elbe, DO [facility] PAGER is on AMION  If 7PM-7AM, please contact night-coverage [email or web address]

## 2021-01-24 ENCOUNTER — Inpatient Hospital Stay (HOSPITAL_COMMUNITY): Payer: Medicaid Other

## 2021-01-24 ENCOUNTER — Telehealth (HOSPITAL_COMMUNITY): Payer: Self-pay | Admitting: Pharmacy Technician

## 2021-01-24 DIAGNOSIS — F419 Anxiety disorder, unspecified: Secondary | ICD-10-CM | POA: Diagnosis not present

## 2021-01-24 DIAGNOSIS — R0602 Shortness of breath: Secondary | ICD-10-CM | POA: Diagnosis not present

## 2021-01-24 DIAGNOSIS — R0789 Other chest pain: Secondary | ICD-10-CM

## 2021-01-24 DIAGNOSIS — I509 Heart failure, unspecified: Secondary | ICD-10-CM

## 2021-01-24 DIAGNOSIS — I5041 Acute combined systolic (congestive) and diastolic (congestive) heart failure: Secondary | ICD-10-CM | POA: Diagnosis not present

## 2021-01-24 LAB — CBC WITH DIFFERENTIAL/PLATELET
Abs Immature Granulocytes: 0.03 10*3/uL (ref 0.00–0.07)
Basophils Absolute: 0.1 10*3/uL (ref 0.0–0.1)
Basophils Relative: 1 %
Eosinophils Absolute: 0.2 10*3/uL (ref 0.0–0.5)
Eosinophils Relative: 2 %
HCT: 40.6 % (ref 36.0–46.0)
Hemoglobin: 13.7 g/dL (ref 12.0–15.0)
Immature Granulocytes: 0 %
Lymphocytes Relative: 14 %
Lymphs Abs: 1.4 10*3/uL (ref 0.7–4.0)
MCH: 29.5 pg (ref 26.0–34.0)
MCHC: 33.7 g/dL (ref 30.0–36.0)
MCV: 87.5 fL (ref 80.0–100.0)
Monocytes Absolute: 0.8 10*3/uL (ref 0.1–1.0)
Monocytes Relative: 7 %
Neutro Abs: 8.1 10*3/uL — ABNORMAL HIGH (ref 1.7–7.7)
Neutrophils Relative %: 76 %
Platelets: 322 10*3/uL (ref 150–400)
RBC: 4.64 MIL/uL (ref 3.87–5.11)
RDW: 13.3 % (ref 11.5–15.5)
WBC: 10.6 10*3/uL — ABNORMAL HIGH (ref 4.0–10.5)
nRBC: 0 % (ref 0.0–0.2)

## 2021-01-24 LAB — COMPREHENSIVE METABOLIC PANEL
ALT: 29 U/L (ref 0–44)
AST: 25 U/L (ref 15–41)
Albumin: 3.6 g/dL (ref 3.5–5.0)
Alkaline Phosphatase: 73 U/L (ref 38–126)
Anion gap: 9 (ref 5–15)
BUN: 16 mg/dL (ref 6–20)
CO2: 25 mmol/L (ref 22–32)
Calcium: 9.1 mg/dL (ref 8.9–10.3)
Chloride: 102 mmol/L (ref 98–111)
Creatinine, Ser: 0.78 mg/dL (ref 0.44–1.00)
GFR, Estimated: >60 mL/min (ref >60)
Glucose, Bld: 106 mg/dL — ABNORMAL HIGH (ref 70–99)
Potassium: 4 mmol/L (ref 3.5–5.1)
Sodium: 136 mmol/L (ref 135–145)
Total Bilirubin: 0.4 mg/dL (ref 0.3–1.2)
Total Protein: 6.6 g/dL (ref 6.5–8.1)

## 2021-01-24 LAB — LIPID PANEL
Cholesterol: 216 mg/dL — ABNORMAL HIGH (ref 0–200)
HDL: 43 mg/dL (ref >40)
LDL Cholesterol: 137 mg/dL — ABNORMAL HIGH (ref 0–99)
Total CHOL/HDL Ratio: 5 RATIO
Triglycerides: 179 mg/dL — ABNORMAL HIGH (ref <150)
VLDL: 36 mg/dL (ref 0–40)

## 2021-01-24 LAB — PROTIME-INR
INR: 1.1 (ref 0.8–1.2)
Prothrombin Time: 14.2 seconds (ref 11.4–15.2)

## 2021-01-24 LAB — PHOSPHORUS: Phosphorus: 4.4 mg/dL (ref 2.5–4.6)

## 2021-01-24 LAB — MAGNESIUM: Magnesium: 2 mg/dL (ref 1.7–2.4)

## 2021-01-24 MED ORDER — GADOBUTROL 1 MMOL/ML IV SOLN
9.0000 mL | Freq: Once | INTRAVENOUS | Status: AC | PRN
  Administered 2021-01-24: 9 mL via INTRAVENOUS

## 2021-01-24 MED ORDER — DAPAGLIFLOZIN PROPANEDIOL 10 MG PO TABS
10.0000 mg | ORAL_TABLET | Freq: Every day | ORAL | Status: DC
  Administered 2021-01-24 – 2021-01-25 (×2): 10 mg via ORAL
  Filled 2021-01-24 (×3): qty 1

## 2021-01-24 NOTE — Progress Notes (Signed)
PROGRESS NOTE    Christine [name]  YYQ:[id number] DOB: 1965-07-06 DOA: 01/20/2021 PCP: Pcp, No   Brief Narrative: The patient is a 55 year old obese female with a past medical history significant for but not limited to anxiety, depression, ADHD, history of chronic tobacco abuse and smoking who presented with a new onset of cough and shortness of breath.  She states that symptoms started last Sunday when she started to develop a dry cough and started to feel short of breath and tightness in her chest as well as "all around her rib cage like a belt around her chest and I could not take a deep breath".  She denied any fevers or chills that she had bronchitis and did not use any over-the-counter medications.  When asked few days her symptoms became worse with minimal activity triggering shortness of breath and cough.  She also states that she felt palpitations yesterday.  At baseline she had severe anxiety and needed to take Xanax 1 mg every 6-8 hours otherwise she would feel severe palpitations.  She already had COVID-19 infection in July 2022 and reports that she has had frequent night sweats after but no weight loss or diarrhea.  She went to urgent care because of her severe shortness of breath and palpitations in urgent care suspected new onset CHF and sent the patient to the ED.  She states that she was not able to smoke her cigarettes due to her shortness of breath.  In the ED she had an EKG done which showed sinus tachycardia.  She had CT of the chest which was negative for PE but did show bilateral peripheral infiltrates suspicious for viral pneumonia versus CHF.  She also had mild bilateral pleural effusions and CBC in BMP were largely within normal limits but BNP was elevated. Further workup was done and ECHOCardiogram done and showed an EF of 20-25%. Cardiology to give her another dose of Lasix (received 20 mg initially) and planning for Cardiac Catheterization. Cardiology to also increase BB dosing to  BID. Currently on a Heparin gtt for Mural LV Apical Thrombus found on ECHO.  Will repeat CXR given diminished breath sounds on the Right and add Oxycodone for Back Pain.  Oxycodone is helping her back pain however was not given today given her hypotension.  Patient is to undergo cardiac catheterization today and cardiology feels that she may have some familial cardiomyopathy if coronary disease is excluded.  Post catheterization cardiology is recommending starting warfarin given her left ventricular mural thrombus in the apex and they also feel that catheterization would be beneficial to proceed with cardiac MRI To confirm the limits but also exclude other etiologies of cardiomyopathy.    Assessment & Plan:   Active Problems:   CHF (congestive heart failure) (HCC)   Anxiety   Tobacco abuse   Shortness of breath   Chest tightness  New onset Acute Systolic CHF with EF of 88-89% -Clinically the patient appeared to have mild fluid overload with bilateral crackles on admission -She had an elevated D-Dimer of 1.26 so obtained CTA of the chest which showed "No evidence of acute pulmonary embolism or other acute vascular findings in the chest. Patchy ground-glass opacities in both lungs with nodular components in the upper lobes, diffuse central airway and interstitial thickening and small bilateral pleural effusions. Findings are nonspecific, but suspicious for viral infection (consider COVID-19 infection). Recommend radiographic follow-up. Mild Aortic Atherosclerosis" -BNP elevated at 658.1 on admission; Has some JVD and Some rales and Crackles noted  -  She is given a dose of IV Lasix 20 mg  x2 and Cardiology increased this to IV 80 mg twice daily however now IV Lasix was stopped given that her weight is now down 5 pounds -Echocardiogram done and showed EF of 20-25% with severely decreased LV Fxn and the Left Ventricle demonstarted global hypokinesis and the Left Ventricle Internal Cavity was severely  dilated with the LV Diastolic parameters being indeterminate. She also had an LV Apical Mural Thrombus and moderate mitral valve regurgitation -Advanced heart failure team was consulted in addition to Medical Cardiology  -We will need strict I's and O's and daily weights; She is -2.023 mL  -She feels as if she has some Urinary Retention so will need to Monitor Bladder Scans q8h and In and Out Cath for >350 mL -Checked TSH, T4 and T3; TSH was 1.876 and T4 was 1.02 and T3 was 3.0' Also checking HbA1c based on Risk factors and she is Pre-diabetic with a HbA1c of 5.8 -Initially Started on Metoprolol Succinate 12.5 mg po Daily and Cardiology increasing dosing yesterday to BID however now they are holding BB for now and recommending no beta-blocker yet given her low cardiac output and low blood pressure -She underwent a cardiac catheterization which showed 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  She also had a right heart cath with elevated filling pressures and moderately reduced cardiac output/index -Cardiology recommending obtaining cardiac MRI which was done and still pending read -She Initiated on Digoxin 0.125 mg p.o. daily cardiology recommending spironolactone 12.5 mg p.o. daily and continuing -Cardiology recommending titrating GDMT and if pressures soft they will need to PICC manage more closely; her blood pressure is too soft for an Emery but cardiology is going to start SGLT2 inhibitor today with Dapagliflozin Propanediol 10 mg po Daily  -Cardiac MRI done today and still pending read -Cardiology is also started aggressive lipid-lowering measures to recommend an LDL cholesterol less than 70 -Cardiology has made referral for cardiac rehab  LV Apical Mural Thrombus -Seen on ECHO -Unclear Etiology -Started Anticoagulation with Heparin gtt and Cardiology planning on transitioning to Warfarin after Cath however now she  has been transitioned to apixaban and cardiology is consulted the advanced heart failure patient advocate for medication assistance  Chest Pain/Discomfort in the setting of CAD -Symptoms are Atypical  -Troponin went from 16 -> 18 -Cardiology recommending considering ischemic evaluation based on ECHO and she is getting a Cardiac Cath in the AM given her EF of 20-25% -CP also occurs when laying down so ? GERD -Started the patient on Metoprolol Succinate 12.5 mg po Daily and Cardiology to increase this today to BID dosing however beta-blockers not been held by cardiology -May try some Nitro paste but defer to Cardiology to initiate given her lower BP -Repeat CXR yesterday AM done showed "No significant change. Likely chronic interstitial changes. Minimal superimposed interstitial edema remains possible." -Cardiology is following for single-vessel LAD occlusion on left heart cath -She was started on statin there is no aspirin since she is anticoagulated for left apical thrombus  Back Pain, intermittent and improved  -Developed after her Lasix  -Improved with acetaminophen but then happened again and has been more constant -We will try lidocaine patch -CTA of the chest showed "No significant systemic arterial abnormalities are identified, although there is limited opacification of the aorta. Minimal aortic and coronary artery atherosclerosis. The heart size is normal. There is  no pericardial effusion." As well as "There is no chest wall mass or suspicious osseous finding. Mild degenerative changes in the spine." -Started Oxycodone with improvement with increased to 5-10 mg q6hprn   Sinus Tachycardia -No PE on Exam -TSH normal; Could be a result of her Amphetamine for her ADHD -Given 500 mL bolus on Admission and will hold further Fluid administration -HR ranging from 100-118 and now improved  -C/w low dose Metoprolol Succinate 12.5 mg po Daily   ? Viral PNA -CTA finidings as above -Patient  had COVID infection in July and tested Negative here -C/w Supportive Care and c/w Guaifenesin 1200 mg po BID and Benzonatate 200 mg po TIDprn Cough   -Also Added Flutter Valve and Incentive Spirometry  -C/w DuoNeb 3 mL q6hprn Wheezing and SOB -Repeat Imaging and follow for Radiographic Improvement; Obtaining CXR today  -SpO2: 95 % O2 Flow Rate (L/min): 2 L/min -She will an Ambulatory Home O2 screen prior to D/C  -Repeat CXR yesterday AM showed "No significant change. Likely chronic interstitial changes. Minimal superimposed interstitial edema remains possible." -Repeat CXR this AM as above   Hypokalemia -Mild. K+ went from 3.8 -> 3.4 -> 4.1 x2 -> 3.4 -> 4.0 -Check Mag Level and was 1.7 so will replete with IV Mag Sulfate 2 grams -Continue to Monitor and Repelet as Necessary   Leukocytosis -WBC went from 11.7 -> 11.7 -> 10.0 -> 8.7 and trended up to 10.6 today -PCT went from <0.10 -> 0.45 -> <0.10 -Continue to Monitor for S/Sx of Infection -Repeat CBC in the AM   Anxiety and Depression -C/w Venlafaxine XR 75 mg po Daily and Alprazolam 1 mg po TIDprn Anxiety   ADHD -C/w Amphetamine-Dextroamphetamine 30 mg po BID  Tobacco abuse and Cigarette Smoker -Smoking cessation counseling -Continue with Nicotine Patch 21 mg TD  Obesity -Complicates overall prognosis and care -Estimated body mass index is 30.78 kg/m as calculated from the following:   Height as of this encounter: 5' 10"  (1.778 m).   Weight as of this encounter: 97.3 kg. -Weight Loss and Dietary Counseling given   DVT prophylaxis: Enoxaparin 40 mg sq q24h changed to Heparin gtt given LV Apical Mural Thrombus  Code Status: FULL CODE  Family Communication: No family currently present at bedside Disposition Plan: We will need to have cardiac clearance prior to discharge  Status is: Inpatient  Remains inpatient appropriate because:Unsafe d/c plan, IV treatments appropriate due to intensity of illness or inability to take  PO, and Inpatient level of care appropriate due to severity of illness  Dispo: The patient is from: Home              Anticipated d/c is to: Home              Patient currently is not medically stable to d/c.   Difficult to place patient No  Consultants:  Cardiology  Advanced Heart Failure Team  Procedures:  ECHOCARDIOGRAM IMPRESSIONS     1. Left ventricular ejection fraction, by estimation, is 20 to 25%. The  left ventricle has severely decreased function. The left ventricle  demonstrates global hypokinesis. The left ventricular internal cavity size  was severely dilated. Left ventricular  diastolic parameters are indeterminate.   2. LV apical mural thrombus (best seen images 109-110)   3. Right ventricular systolic function is normal. The right ventricular  size is normal. There is normal pulmonary artery systolic pressure.   4. The mitral valve is abnormal. Moderate mitral valve regurgitation.  Appears  functional.   5. The aortic valve was not well visualized. Aortic valve regurgitation  is trivial. No aortic stenosis is present.   6. Aortic dilatation noted. There is mild dilatation of the ascending  aorta, measuring 37 mm.   7. The inferior vena cava is normal in size with <50% respiratory  variability, suggesting right atrial pressure of 8 mmHg.   FINDINGS   Left Ventricle: Left ventricular ejection fraction, by estimation, is 20  to 25%. The left ventricle has severely decreased function. The left  ventricle demonstrates global hypokinesis. Definity contrast agent was  given IV to delineate the left  ventricular endocardial borders. The left ventricular internal cavity size  was severely dilated. There is no left ventricular hypertrophy. Left  ventricular diastolic parameters are indeterminate.   Right Ventricle: The right ventricular size is normal. No increase in  right ventricular wall thickness. Right ventricular systolic function is  normal. There is normal  pulmonary artery systolic pressure. The tricuspid  regurgitant velocity is 1.80 m/s, and   with an assumed right atrial pressure of 8 mmHg, the estimated right  ventricular systolic pressure is 03.5 mmHg.   Left Atrium: Left atrial size was normal in size.   Right Atrium: Right atrial size was normal in size.   Pericardium: There is no evidence of pericardial effusion.   Mitral Valve: The mitral valve is abnormal. Moderate mitral valve  regurgitation.   Tricuspid Valve: The tricuspid valve is normal in structure. Tricuspid  valve regurgitation is trivial.   Aortic Valve: The aortic valve was not well visualized. Aortic valve  regurgitation is trivial. No aortic stenosis is present.   Pulmonic Valve: The pulmonic valve was grossly normal. Pulmonic valve  regurgitation is not visualized.   Aorta: The aortic root is normal in size and structure and aortic  dilatation noted. There is mild dilatation of the ascending aorta,  measuring 37 mm.   Venous: The inferior vena cava is normal in size with less than 50%  respiratory variability, suggesting right atrial pressure of 8 mmHg.   IAS/Shunts: The interatrial septum was not well visualized.      LEFT VENTRICLE  PLAX 2D  LVIDd:         6.40 cm      Diastology  LVIDs:         5.80 cm      LV e' medial:    7.20 cm/s  LV PW:         0.90 cm      LV E/e' medial:  24.0  LV IVS:        0.90 cm      LV e' lateral:   10.60 cm/s  LVOT diam:     2.10 cm      LV E/e' lateral: 16.3  LV SV:         43  LV SV Index:   20  LVOT Area:     3.46 cm     LV Volumes (MOD)  LV vol d, MOD A2C: 272.0 ml  LV vol d, MOD A4C: 211.0 ml  LV vol s, MOD A2C: 202.0 ml  LV vol s, MOD A4C: 166.0 ml  LV SV MOD A2C:     70.0 ml  LV SV MOD A4C:     211.0 ml  LV SV MOD BP:      56.2 ml   RIGHT VENTRICLE  RV S prime:     9.20 cm/s  TAPSE (M-mode): 1.5 cm  LEFT ATRIUM             Index        RIGHT ATRIUM           Index  LA diam:        3.10 cm 1.43  cm/m   RA Area:     12.30 cm  LA Vol (A2C):   66.5 ml 30.77 ml/m  RA Volume:   28.40 ml  13.14 ml/m  LA Vol (A4C):   53.1 ml 24.57 ml/m  LA Biplane Vol: 62.3 ml 28.83 ml/m   AORTIC VALVE  LVOT Vmax:   88.83 cm/s  LVOT Vmean:  56.267 cm/s  LVOT VTI:    0.123 m     AORTA  Ao Root diam: 3.60 cm  Ao Asc diam:  3.70 cm   MITRAL VALVE                  TRICUSPID VALVE  MV Area (PHT): 6.96 cm       TR Peak grad:   13.0 mmHg  MV Decel Time: 109 msec       TR Vmax:        180.00 cm/s  MR Peak grad:    76.7 mmHg  MR Mean grad:    49.0 mmHg    SHUNTS  MR Vmax:         438.00 cm/s  Systemic VTI:  0.12 m  MR Vmean:        326.5 cm/s   Systemic Diam: 2.10 cm  MR PISA:         2.26 cm  MR PISA Eff ROA: 20 mm  MR PISA Radius:  0.60 cm  MV E velocity: 173.00 cm/s  MV A velocity: 76.50 cm/s  MV E/A ratio:  2.26   CARDIAC CATH  Findings:   RA = not sampled RV = not sampled PA = 36/16 (29) PCW = 27 (v = 35) Fick cardiac output/index = 4.7/2.2 PVR = 0.8 WU Ao at = 99% PA sat = 68%, 65% High SVC = 75%   Assessment: Severe 1v CAD with occlusion of mid LAD Ischemic CM EF 20-25% Elevated filling pressures with moderately reduced cardiac output Significant v waves in PCWP tracing suggestive of significant MR vs diastolic dysfunction   Plan/Discussion:   Degree of LV dysfunction seems out of proportion to CAD. Will check cMRI. Medical therapy. Continue diuresis. Add digoxin. Hold b-blocker.   Cardiac MRI Pending Read   Antimicrobials:  Anti-infectives (From admission, onward)    None       Subjective: Seen and examined at bedside and is that she is feeling much better today.  Denies any shortness of breath.  States that her back pain is much improved with the increased dose of oxycodone.  Feels well.  Awaiting cardiac MRI results.  No nausea or vomiting.  No any other concerns or complaints this time and hopeful to be discharged tomorrow.  Objective: Vitals:   01/23/21  1745 01/23/21 2016 01/24/21 0329 01/24/21 1101  BP: 115/69 103/70 92/60 106/67  Pulse: (!) 103 (!) 102 96 100  Resp: 16 18 17 19   Temp:  98.4 F (36.9 C) 97.7 F (36.5 C) 98.1 F (36.7 C)  TempSrc:  Oral Oral Oral  SpO2: 94% 96% 97% 95%  Weight:   97.3 kg   Height:        Intake/Output Summary (Last 24 hours) at 01/24/2021 1546 Last data filed at 01/24/2021 1249 Gross  per 24 hour  Intake 1070 ml  Output 1400 ml  Net -330 ml    Filed Weights   01/22/21 0104 01/23/21 0502 01/24/21 0329  Weight: 99.2 kg 99.3 kg 97.3 kg   Examination: Physical Exam:  Constitutional: WN/WD obese Caucasian female currently no acute distress appears calm and comfortable today and not complaining of any back pain or shortness of breath Eyes: Lids and conjunctivae normal, sclerae anicteric  ENMT: External Ears, Nose appear normal. Grossly normal hearing. Mucous membranes are moist.  Neck: Appears normal, supple, no cervical masses, normal ROM, no appreciable thyromegaly: No appreciable JVD Respiratory: Diminished to auscultation bilaterally with coarse breath sounds, no wheezing, rales, rhonchi or crackles. Normal respiratory effort and patient is not tachypenic. No accessory muscle use.  Unlabored breathing and not wearing supplemental oxygen nasal cannula Cardiovascular: RRR, no murmurs / rubs / gallops. S1 and S2 auscultated.  Very mild extremity edema Abdomen: Soft, non-tender, distended secondary body habitus. Bowel sounds positive.  GU: Deferred. Musculoskeletal: No clubbing / cyanosis of digits/nails. No joint deformity upper and lower extremities.  Skin: No rashes, lesions, ulcers on limited skin evaluation. No induration; Warm and dry.  Neurologic: CN 2-12 grossly intact with no focal deficits. Romberg sign and cerebellar reflexes not assessed.  Psychiatric: Normal judgment and insight. Alert and oriented x 3. Normal mood and appropriate affect.  Data Reviewed: I have personally reviewed  following labs and imaging studies  CBC: Recent Labs  Lab 01/20/21 1139 01/21/21 0318 01/22/21 0101 01/23/21 0418 01/23/21 1354 01/23/21 1401 01/23/21 1416 01/24/21 0223  WBC 11.7* 11.7* 10.0 8.7  --   --   --  10.6*  NEUTROABS 8.8*  --  5.8 4.9  --   --   --  8.1*  HGB 13.8 14.3 13.5 13.6 11.9* 12.9  12.2 11.2* 13.7  HCT 41.5 42.1 40.8 41.6 35.0* 38.0  36.0 33.0* 40.6  MCV 86.8 86.8 87.9 89.7  --   --   --  87.5  PLT 349 349 291 298  --   --   --  174    Basic Metabolic Panel: Recent Labs  Lab 01/20/21 1139 01/21/21 0318 01/21/21 0926 01/22/21 0101 01/23/21 0627 01/23/21 1354 01/23/21 1401 01/23/21 1416 01/24/21 0223  NA 139 136  --  137 136 146* 140  144 144 136  K 3.8 3.4*  --  4.1 4.1 3.6 4.0  3.6 3.4* 4.0  CL 109 106  --  107 106  --   --   --  102  CO2 21* 22  --  20* 24  --   --   --  25  GLUCOSE 109* 117*  --  109* 102*  --   --   --  106*  BUN 13 9  --  14 14  --   --   --  16  CREATININE 0.64 0.69  --  0.69 0.68  --   --   --  0.78  CALCIUM 9.4 8.9  --  8.7* 8.6*  --   --   --  9.1  MG  --   --  1.7 1.7 1.8  --   --   --  2.0  PHOS  --   --  3.4 3.1 3.6  --   --   --  4.4    GFR: Estimated Creatinine Clearance: 102.7 mL/min (by C-G formula based on SCr of 0.78 mg/dL). Liver Function Tests: Recent Labs  Lab 01/21/21 0814 01/22/21  0101 01/23/21 0627 01/24/21 0223  AST 18 18 22 25   ALT 14 11 22 29   ALKPHOS 72 68 66 73  BILITOT 1.1 0.5 0.5 0.4  PROT 6.6 6.1* 6.3* 6.6  ALBUMIN 3.5 3.3* 3.3* 3.6    No results for input(s): LIPASE, AMYLASE in the last 168 hours. No results for input(s): AMMONIA in the last 168 hours. Coagulation Profile: Recent Labs  Lab 01/24/21 0223  INR 1.1   Cardiac Enzymes: No results for input(s): CKTOTAL, CKMB, CKMBINDEX, TROPONINI in the last 168 hours. BNP (last 3 results) No results for input(s): PROBNP in the last 8760 hours. HbA1C: Recent Labs    01/23/21 0418  HGBA1C 5.8*    CBG: Recent Labs  Lab  01/21/21 2112  GLUCAP 129*    Lipid Profile: Recent Labs    01/24/21 0223  CHOL 216*  HDL 43  LDLCALC 137*  TRIG 179*  CHOLHDL 5.0   Thyroid Function Tests: No results for input(s): TSH, T4TOTAL, FREET4, T3FREE, THYROIDAB in the last 72 hours.  Anemia Panel: No results for input(s): VITAMINB12, FOLATE, FERRITIN, TIBC, IRON, RETICCTPCT in the last 72 hours. Sepsis Labs: Recent Labs  Lab 01/20/21 2000 01/21/21 0318 01/22/21 0101  PROCALCITON <0.10 0.45 <0.10     Recent Results (from the past 240 hour(s))  Resp Panel by RT-PCR (Flu A&B, Covid) Nasopharyngeal Swab     Status: None   Collection Time: 01/20/21  3:20 PM   Specimen: Nasopharyngeal Swab; Nasopharyngeal(NP) swabs in vial transport medium  Result Value Ref Range Status   SARS Coronavirus 2 by RT PCR NEGATIVE NEGATIVE Final    Comment: (NOTE) SARS-CoV-2 target nucleic acids are NOT DETECTED.  The SARS-CoV-2 RNA is generally detectable in upper respiratory specimens during the acute phase of infection. The lowest concentration of SARS-CoV-2 viral copies this assay can detect is 138 copies/mL. A negative result does not preclude SARS-Cov-2 infection and should not be used as the sole basis for treatment or other patient management decisions. A negative result may occur with  improper specimen collection/handling, submission of specimen other than nasopharyngeal swab, presence of viral mutation(s) within the areas targeted by this assay, and inadequate number of viral copies(<138 copies/mL). A negative result must be combined with clinical observations, patient history, and epidemiological information. The expected result is Negative.  Fact Sheet for Patients:  EntrepreneurPulse.com.au  Fact Sheet for Healthcare Providers:  IncredibleEmployment.be  This test is no t yet approved or cleared by the [place] FDA and  has been authorized for detection and/or diagnosis of  SARS-CoV-2 by FDA under an Emergency Use Authorization (EUA). This EUA will remain  in effect (meaning this test can be used) for the duration of the COVID-19 declaration under Section 564(b)(1) of the Act, 21 U.S.C.section 360bbb-3(b)(1), unless the authorization is terminated  or revoked sooner.       Influenza A by PCR NEGATIVE NEGATIVE Final   Influenza B by PCR NEGATIVE NEGATIVE Final    Comment: (NOTE) The Xpert Xpress SARS-CoV-2/FLU/RSV plus assay is intended as an aid in the diagnosis of influenza from Nasopharyngeal swab specimens and should not be used as a sole basis for treatment. Nasal washings and aspirates are unacceptable for Xpert Xpress SARS-CoV-2/FLU/RSV testing.  Fact Sheet for Patients: EntrepreneurPulse.com.au  Fact Sheet for Healthcare Providers: IncredibleEmployment.be  This test is not yet approved or cleared by the [place] FDA and has been authorized for detection and/or diagnosis of SARS-CoV-2 by FDA under an Emergency Use Authorization (EUA). This  EUA will remain in effect (meaning this test can be used) for the duration of the COVID-19 declaration under Section 564(b)(1) of the Act, 21 U.S.C. section 360bbb-3(b)(1), unless the authorization is terminated or revoked.  Performed at [facility], [address] [place], [place] [address]      RN Pressure Injury Documentation:     Estimated body mass index is 30.78 kg/m as calculated from the following:   Height as of this encounter: 5' 10"  (1.778 m).   Weight as of this encounter: 97.3 kg.  Malnutrition Type:   Malnutrition Characteristics:  Nutrition Interventions:    Radiology Studies: CARDIAC CATHETERIZATION  Result Date: 01/23/2021   Mid LAD to Dist LAD lesion is 99% stenosed.   2nd Sept lesion is 99% stenosed.   Prox Cx to Mid Cx lesion is 20% stenosed.   Ost LM lesion is 20% stenosed.   The left ventricular ejection  fraction is less than 25% by visual estimate. 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 Will check cMRI. Medical therapy. Continue diuresis. Add digoxin. Hold b-blocker. Glori Bickers, MD 2:30 PM  DG CHEST PORT 1 VIEW  Result Date: 01/23/2021 CLINICAL DATA:  Shortness of breath, chest tightness EXAM: PORTABLE CHEST 1 VIEW COMPARISON:  01/22/2021 FINDINGS: Similar interstitial changes. No new consolidation. No pleural effusion. No pneumothorax. Stable normal heart size. IMPRESSION: No significant change. Likely chronic interstitial changes. Minimal superimposed interstitial edema remains possible. Electronically Signed   By: Macy Mis M.D.   On: 01/23/2021 08:48    Scheduled Meds:  amphetamine-dextroamphetamine  30 mg Oral BID   apixaban  5 mg Oral BID   aspirin EC  81 mg Oral Daily   atorvastatin  80 mg Oral Daily   cyclobenzaprine  20 mg Oral QHS   dapagliflozin propanediol  10 mg Oral Daily   digoxin  0.125 mg Oral Daily   guaiFENesin  1,200 mg Oral BID   lidocaine  1 patch Transdermal Q24H   nicotine  21 mg Transdermal Daily   sodium chloride flush  3 mL Intravenous Q12H   sodium chloride flush  3 mL Intravenous Q12H   spironolactone  12.5 mg Oral Daily   venlafaxine XR  75 mg Oral Q breakfast   zolpidem  5 mg Oral QHS   Continuous Infusions:  sodium chloride     sodium chloride      LOS: 4 days   Kerney Elbe, DO [facility] PAGER is on AMION  If 7PM-7AM, please contact night-coverage [email or web address]

## 2021-01-24 NOTE — Progress Notes (Signed)
Progress Note  Patient Name: Christine [name] Date of Encounter: 01/24/2021  [facility] Cardiologist: Skeet Latch, MD   Subjective   Breathing better this morning, but continues to have a "knot" in her lower right posterior thoracic area. After the increased dose of intravenous diuretics yesterday she felt weak and had transient diaphoresis but recovered fairly quickly.  Feels well this morning and slept well last night.  No anginal chest pain. Cardiac catheterization showed total occlusion of the mid LAD artery, with disproportionate severe reduction in LV systolic function and markedly elevated left heart filling pressures with low cardiac output  Inpatient Medications    Scheduled Meds:  amphetamine-dextroamphetamine  30 mg Oral BID   apixaban  5 mg Oral BID   aspirin EC  81 mg Oral Daily   atorvastatin  80 mg Oral Daily   cyclobenzaprine  20 mg Oral QHS   digoxin  0.125 mg Oral Daily   furosemide  80 mg Intravenous BID   guaiFENesin  1,200 mg Oral BID   lidocaine  1 patch Transdermal Q24H   nicotine  21 mg Transdermal Daily   sodium chloride flush  3 mL Intravenous Q12H   sodium chloride flush  3 mL Intravenous Q12H   spironolactone  12.5 mg Oral Daily   venlafaxine XR  75 mg Oral Q breakfast   zolpidem  5 mg Oral QHS   Continuous Infusions:  sodium chloride     sodium chloride     PRN Meds: sodium chloride, sodium chloride, acetaminophen, acetaminophen, ALPRAZolam, benzonatate, ipratropium-albuterol, ondansetron (ZOFRAN) IV, ondansetron (ZOFRAN) IV, oxyCODONE, sodium chloride flush, sodium chloride flush   Vital Signs    Vitals:   01/23/21 1700 01/23/21 1745 01/23/21 2016 01/24/21 0329  BP: 105/61 115/69 103/70 92/60  Pulse: (!) 102 (!) 103 (!) 102 96  Resp: 16 16 18 17   Temp:   98.4 F (36.9 C) 97.7 F (36.5 C)  TempSrc:   Oral Oral  SpO2: 94% 94% 96% 97%  Weight:    97.3 kg  Height:        Intake/Output Summary (Last 24 hours) at 01/24/2021  0911 Last data filed at 01/24/2021 0826 Gross per 24 hour  Intake 865.22 ml  Output 2250 ml  Net -1384.78 ml   Last 3 Weights 01/24/2021 01/23/2021 01/22/2021  Weight (lbs) 214 lb 8 oz 219 lb 218 lb 12.8 oz  Weight (kg) 97.297 kg 99.338 kg 99.247 kg      Telemetry    Sinus rhythm- Personally Reviewed  ECG    No new tracing- Personally Reviewed  Physical Exam   GEN: No acute distress.   Neck: Hard to see any JVD Cardiac: RRR, no murmurs, rubs, or gallops.  Respiratory: Clear to auscultation bilaterally. GI: Soft, nontender, non-distended  MS: No edema; No deformity. Neuro:  Nonfocal  Psych: Normal affect   Labs    High Sensitivity Troponin:   Recent Labs  Lab 01/20/21 1139 01/20/21 1333  TROPONINIHS 16 18*     Chemistry Recent Labs  Lab 01/22/21 0101 01/23/21 0627 01/23/21 1354 01/23/21 1401 01/23/21 1416 01/24/21 0223  NA 137 136   < > 140  144 144 136  K 4.1 4.1   < > 4.0  3.6 3.4* 4.0  CL 107 106  --   --   --  102  CO2 20* 24  --   --   --  25  GLUCOSE 109* 102*  --   --   --  106*  BUN 14 14  --   --   --  16  CREATININE 0.69 0.68  --   --   --  0.78  CALCIUM 8.7* 8.6*  --   --   --  9.1  MG 1.7 1.8  --   --   --  2.0  PROT 6.1* 6.3*  --   --   --  6.6  ALBUMIN 3.3* 3.3*  --   --   --  3.6  AST 18 22  --   --   --  25  ALT 11 22  --   --   --  29  ALKPHOS 68 66  --   --   --  73  BILITOT 0.5 0.5  --   --   --  0.4  GFRNONAA >60 >60  --   --   --  >60  ANIONGAP 10 6  --   --   --  9   < > = values in this interval not displayed.    Lipids  Recent Labs  Lab 01/24/21 0223  CHOL 216*  TRIG 179*  HDL 43  LDLCALC 137*  CHOLHDL 5.0    Hematology Recent Labs  Lab 01/22/21 0101 01/23/21 0418 01/23/21 1354 01/23/21 1401 01/23/21 1416 01/24/21 0223  WBC 10.0 8.7  --   --   --  10.6*  RBC 4.64 4.64  --   --   --  4.64  HGB 13.5 13.6   < > 12.9  12.2 11.2* 13.7  HCT 40.8 41.6   < > 38.0  36.0 33.0* 40.6  MCV 87.9 89.7  --   --   --   87.5  MCH 29.1 29.3  --   --   --  29.5  MCHC 33.1 32.7  --   --   --  33.7  RDW 13.3 13.5  --   --   --  13.3  PLT 291 298  --   --   --  322   < > = values in this interval not displayed.   Thyroid  Recent Labs  Lab 01/20/21 1814  TSH 1.876  FREET4 1.02    BNP Recent Labs  Lab 01/20/21 1139  BNP 658.1*    DDimer  Recent Labs  Lab 01/20/21 1126  DDIMER 1.26*     Radiology    CARDIAC CATHETERIZATION  Result Date: 01/23/2021   Mid LAD to Dist LAD lesion is 99% stenosed.   2nd Sept lesion is 99% stenosed.   Prox Cx to Mid Cx lesion is 20% stenosed.   Ost LM lesion is 20% stenosed.   The left ventricular ejection fraction is less than 25% by visual estimate. 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 Will check cMRI. Medical therapy. Continue diuresis. Add digoxin. Hold b-blocker. Glori Bickers, MD 2:30 PM  DG CHEST PORT 1 VIEW  Result Date: 01/23/2021 CLINICAL DATA:  Shortness of breath, chest tightness EXAM: PORTABLE CHEST 1 VIEW COMPARISON:  01/22/2021 FINDINGS:  Similar interstitial changes. No new consolidation. No pleural effusion. No pneumothorax. Stable normal heart size. IMPRESSION: No significant change. Likely chronic interstitial changes. Minimal superimposed interstitial edema remains possible. Electronically Signed   By: Macy Mis M.D.   On: 01/23/2021 08:48   DG CHEST PORT 1 VIEW  Result Date: 01/22/2021 CLINICAL DATA:  55 year old female with a history of shortness of breath EXAM: PORTABLE CHEST 1 VIEW COMPARISON:  01/21/2021  FINDINGS: Cardiomediastinal silhouette unchanged in size and contour. Interlobular septal thickening persists. No new confluent airspace disease. Mild coarsened interstitial markings. No acute displaced fracture. Degenerative changes of the spine. IMPRESSION: Persisting interlobular septal thickening, suggesting persisting edema, with no new confluent airspace disease. Electronically Signed   By: Corrie Mckusick D.O.   On: 01/22/2021 12:43    Cardiac Studies   ECHO: 01/21/2021  1. Left ventricular ejection fraction, by estimation, is 20 to 25%. The  left ventricle has severely decreased function. The left ventricle  demonstrates global hypokinesis. The left ventricular internal cavity size  was severely dilated. Left ventricular diastolic parameters are indeterminate.   2. LV apical mural thrombus (best seen images 109-110)   3. Right ventricular systolic function is normal. The right ventricular  size is normal. There is normal pulmonary artery systolic pressure.   4. The mitral valve is abnormal. Moderate mitral valve regurgitation.  Appears functional.   5. The aortic valve was not well visualized. Aortic valve regurgitation  is trivial. No aortic stenosis is present.   6. Aortic dilatation noted. There is mild dilatation of the ascending  aorta, measuring 37 mm.   7. The inferior vena cava is normal in size with <50% respiratory  variability, suggesting right atrial pressure of 8 mmHg.   Right and left heart catheterization 01/24/2019    Mid LAD to Dist LAD lesion is 99% stenosed.   2nd Sept lesion is 99% stenosed.   Prox Cx to Mid Cx lesion is 20% stenosed.   Ost LM lesion is 20% stenosed.   The left ventricular ejection fraction is less than 25% by visual estimate.   Findings:   RA = not sampled RV = not sampled PA = 36/16 (29) PCW = 27 (v = 35) Fick cardiac output/index = 4.7/2.2 PVR = 0.8 WU Ao at = 99% PA sat = 68%, 65% High SVC = 75%   Assessment: Severe 1v CAD with  occlusion of mid LAD Ischemic CM EF 20-25% Elevated filling pressures with moderately reduced cardiac output Significant v waves in PCWP tracing suggestive of significant MR vs diastolic dysfunction   Plan/Discussion:   Degree of LV dysfunction seems out of proportion to CAD. Will check cMRI. Medical therapy. Continue diuresis. Add digoxin. Hold b-blocker.     Patient Profile     55 y.o. female motor with newly diagnosed acute systolic heart failure, severely depressed left ventricular systolic function with mixed ischemic and nonischemic etiology, single-vessel CAD with occlusion of the mid LAD artery, LV apical mural thrombus, moderate mitral regurgitation  Assessment & Plan    Cardiac MRI has been performed.  Formal interpretation pending, but upon my review of the images I think there is unequivocal full-thickness scarring of the distal LAD territory.  She is unlikely to benefit from revascularization.  There does appear to be a tiny LV apical thrombus. -Anticoagulation has been initiated -Continue intravenous diuretics today, will discharge with oral diuretics and hopefully SGLT2 inhibitor such as [place].  Has started low-dose spironolactone. -Digoxin has been initiated.  Beta-blockers on  hold due to low cardiac output and low BP. -Unlikely to tolerate Entresto. -Smoking cessation (she has already committed to permanently quitting) -Aggressive lipid-lowering to LDL cholesterol less than 70 -She does not have medical insurance and will need assistance with her hospital bills and expensive medications such as Eliquis and [place].     For questions or updates, please contact [place] Please consult [email or web address] for contact info under        Signed, Sanda Klein, MD  01/24/2021, 9:11 AM

## 2021-01-24 NOTE — Plan of Care (Signed)

## 2021-01-24 NOTE — Telephone Encounter (Signed)
Advanced Heart Failure Patient Advocate Encounter  The patient is currently admitted and will be discharged to the AHF clinic. The patient was started on Eliquis and is currently uninsured. Started an application for BMS assistance.  Will fax in once signatures are received.   Of note, patient can use HF fund to receive [place].

## 2021-01-24 NOTE — Progress Notes (Addendum)
Advanced Heart Failure Rounding Note  PCP-Cardiologist: Skeet Latch, MD   Subjective:    Admitted w/ new systolic HF. Echo EF 20-25%, Apex aneurysmal w/ LV thrombus, RV ok. Mod MR.   R/LHC w/ severe 1v CAD with occlusion of mid LAD. Elevated filling pressures with moderately reduced cardiac output. PCW 27. CI 2.2   2.9 L in UOP yesterday w/ IV Lasix. Wt down 5 lb. SCr stable. K 4.0.  SBPs soft, low 22L systolic.  Feels better. Up ambulating. No CP. Denies dyspnea. No orthostatic symptoms.    RHC Findings:   RA = not sampled RV = not sampled PA = 36/16 (29) PCW = 27 (v = 35) Fick cardiac output/index = 4.7/2.2 PVR = 0.8 WU Ao at = 99% PA sat = 68%, 65% High SVC = 75%  Objective:   Weight Range: 97.3 kg Body mass index is 30.78 kg/m.   Vital Signs:   Temp:  [97.7 F (36.5 C)-98.6 F (37 C)] 97.7 F (36.5 C) (10/11 0329) Pulse Rate:  [95-108] 96 (10/11 0329) Resp:  [15-27] 17 (10/11 0329) BP: (92-116)/(60-101) 92/60 (10/11 0329) SpO2:  [92 %-100 %] 97 % (10/11 0329) Weight:  [97.3 kg] 97.3 kg (10/11 0329) Last BM Date: (P) 01/23/21  Weight change: Filed Weights   01/22/21 0104 01/23/21 0502 01/24/21 0329  Weight: 99.2 kg 99.3 kg 97.3 kg    Intake/Output:   Intake/Output Summary (Last 24 hours) at 01/24/2021 0910 Last data filed at 01/24/2021 0826 Gross per 24 hour  Intake 865.22 ml  Output 2250 ml  Net -1384.78 ml      Physical Exam    General:  Well appearing. No resp difficulty HEENT: Normal Neck: Supple. No JVP . Carotids 2+ bilat; no bruits. No lymphadenopathy or thyromegaly appreciated. Cor: PMI nondisplaced. Regular rate & rhythm. No rubs, gallops or murmurs. Lungs: Clear Abdomen: Soft, nontender, nondistended. No hepatosplenomegaly. No bruits or masses. Good bowel sounds. Extremities: No cyanosis, clubbing, rash, edema Neuro: Alert & orientedx3, cranial nerves grossly intact. moves all 4 extremities w/o difficulty. Affect  pleasant   Telemetry   NSR 80s   EKG    No new EKG to review   Labs    CBC Recent Labs    01/23/21 0418 01/23/21 1354 01/23/21 1416 01/24/21 0223  WBC 8.7  --   --  10.6*  NEUTROABS 4.9  --   --  8.1*  HGB 13.6   < > 11.2* 13.7  HCT 41.6   < > 33.0* 40.6  MCV 89.7  --   --  87.5  PLT 298  --   --  322   < > = values in this interval not displayed.   Basic Metabolic Panel Recent Labs    01/23/21 0627 01/23/21 1354 01/23/21 1416 01/24/21 0223  NA 136   < > 144 136  K 4.1   < > 3.4* 4.0  CL 106  --   --  102  CO2 24  --   --  25  GLUCOSE 102*  --   --  106*  BUN 14  --   --  16  CREATININE 0.68  --   --  0.78  CALCIUM 8.6*  --   --  9.1  MG 1.8  --   --  2.0  PHOS 3.6  --   --  4.4   < > = values in this interval not displayed.   Liver Function Tests Recent Labs  01/23/21 0627 01/24/21 0223  AST 22 25  ALT 22 29  ALKPHOS 66 73  BILITOT 0.5 0.4  PROT 6.3* 6.6  ALBUMIN 3.3* 3.6   No results for input(s): LIPASE, AMYLASE in the last 72 hours. Cardiac Enzymes No results for input(s): CKTOTAL, CKMB, CKMBINDEX, TROPONINI in the last 72 hours.  BNP: BNP (last 3 results) Recent Labs    01/20/21 1139  BNP 658.1*    ProBNP (last 3 results) No results for input(s): PROBNP in the last 8760 hours.   D-Dimer No results for input(s): DDIMER in the last 72 hours. Hemoglobin A1C Recent Labs    01/23/21 0418  HGBA1C 5.8*   Fasting Lipid Panel Recent Labs    01/24/21 0223  CHOL 216*  HDL 43  LDLCALC 137*  TRIG 179*  CHOLHDL 5.0   Thyroid Function Tests No results for input(s): TSH, T4TOTAL, T3FREE, THYROIDAB in the last 72 hours.  Invalid input(s): FREET3  Other results:   Imaging    CARDIAC CATHETERIZATION  Result Date: 01/23/2021   Mid LAD to Dist LAD lesion is 99% stenosed.   2nd Sept lesion is 99% stenosed.   Prox Cx to Mid Cx lesion is 20% stenosed.   Ost LM lesion is 20% stenosed.   The left ventricular ejection fraction is  less than 25% by visual estimate. 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 Will check cMRI. Medical therapy. Continue diuresis. Add digoxin. Hold b-blocker. Glori Bickers, MD 2:30 PM    Medications:     Scheduled Medications:  amphetamine-dextroamphetamine  30 mg Oral BID   apixaban  5 mg Oral BID   aspirin EC  81 mg Oral Daily   atorvastatin  80 mg Oral Daily   cyclobenzaprine  20 mg Oral QHS   digoxin  0.125 mg Oral Daily   furosemide  80 mg Intravenous BID   guaiFENesin  1,200 mg Oral BID   lidocaine  1 patch Transdermal Q24H   nicotine  21 mg Transdermal Daily   sodium chloride flush  3 mL Intravenous Q12H   sodium chloride flush  3 mL Intravenous Q12H   spironolactone  12.5 mg Oral Daily   venlafaxine XR  75 mg Oral Q breakfast   zolpidem  5 mg Oral QHS    Infusions:  sodium chloride     sodium chloride      PRN Medications: sodium chloride, sodium chloride, acetaminophen, acetaminophen, ALPRAZolam, benzonatate, ipratropium-albuterol, ondansetron (ZOFRAN) IV, ondansetron (ZOFRAN) IV, oxyCODONE, sodium chloride flush, sodium chloride flush    Assessment/Plan   1. Acute Systolic HF/New cardiomyopathy: -Admitted with new HF on 10/07 -Echo EF 20-25%, severely dilated LV, RV okay, moderate MR -RHC with elevated filling pressures (PCWP 27 mmHg), moderately reduced cardiac output/index (4.7/2.2) -single vessel CAD with occlusion mid LAD - degree of LV fxn out of proportion to CAD - Etiology not certain. ? Tachymediated. Sinus tach noted at prior  medical visits, has been on long-term adderall. Also considered viral myocarditis with known hx COVID-19 infection - Plan cMRI today  -  Good diuresis w/ IV lasix, wt down 5 lb. Stop IV Lasix today  - Continue digoxin 0.125 mg daily - No beta blocker yet  - Continue spiro 12.5 mg daily - BP too soft for ARNi  - Start SGLT2i today - CR consult     2. CAD: - Single vessel LAD occlusion on LHC - Continue statin. - No aspirin since anticoagulated for LV apical thrombus   3. Tobacco use: -Cessation advised   4. LV apical thrombus: - Noted on echo.  - on Eliquis  TOC consulted to help w/ insurance. Ambulate w/ CR today. Home tomorrow.    Length of Stay: [address], PA-C  01/24/2021, 9:10 AM  Advanced Heart Failure Team Pager [phone] (M-F; 7a - 5p)  Please contact [facility] for night-coverage after hours (5p -7a ) and weekends on [email or web address]  Patient seen and examined with the above-signed Advanced Practice Provider and/or Housestaff. I personally reviewed laboratory data, imaging studies and relevant notes. I independently examined the patient and formulated the important aspects of the plan. I have edited the note to reflect any of my changes or salient points. I have personally discussed the plan with the patient and/or family.  Cath results reviewed. Had further diuresis ylast night. Feels much better. No CP or SOB. Orthopnea resolved.   General:  Well appearing. No resp difficulty HEENT: normal Neck: supple. no JVD. Carotids 2+ bilat; no bruits. No lymphadenopathy or thryomegaly appreciated. Cor: PMI nondisplaced. Regular rate & rhythm. No rubs, gallops or murmurs. Lungs: clear Abdomen: soft, nontender, nondistended. No hepatosplenomegaly. No bruits or masses. Good bowel sounds. Extremities: no cyanosis, clubbing, rash, edema Neuro: alert & orientedx3, cranial nerves grossly intact. moves all 4 extremities w/o difficulty. Affect pleasant  Suspect  combination if iCM and NICM. Much improved with diuresis. BP soft. Will stop IV lasix, Start SGLT2i. Continue spiro  and digoxin. On Eliquis for LV thrombus. Await cMRI. CR consult.   Glori Bickers, MD  4:34 PM

## 2021-01-24 NOTE — Progress Notes (Signed)
Heart Failure Navigator Progress Note  Assessed for Heart & Vascular TOC clinic readiness.  Patient does not meet criteria due to AHF rounding team consulted this admission.   Navigator available for reassessment of patient.   Pricilla Holm, MSN, RN Heart Failure Nurse Navigator [phone]

## 2021-01-25 ENCOUNTER — Inpatient Hospital Stay (HOSPITAL_COMMUNITY): Payer: Medicaid Other

## 2021-01-25 ENCOUNTER — Other Ambulatory Visit (HOSPITAL_COMMUNITY): Payer: Self-pay

## 2021-01-25 DIAGNOSIS — F419 Anxiety disorder, unspecified: Secondary | ICD-10-CM | POA: Diagnosis not present

## 2021-01-25 DIAGNOSIS — I5041 Acute combined systolic (congestive) and diastolic (congestive) heart failure: Secondary | ICD-10-CM | POA: Diagnosis not present

## 2021-01-25 DIAGNOSIS — R0602 Shortness of breath: Secondary | ICD-10-CM | POA: Diagnosis not present

## 2021-01-25 DIAGNOSIS — R0789 Other chest pain: Secondary | ICD-10-CM | POA: Diagnosis not present

## 2021-01-25 LAB — COMPREHENSIVE METABOLIC PANEL
ALT: 25 U/L (ref 0–44)
AST: 22 U/L (ref 15–41)
Albumin: 3.7 g/dL (ref 3.5–5.0)
Alkaline Phosphatase: 80 U/L (ref 38–126)
Anion gap: 11 (ref 5–15)
BUN: 15 mg/dL (ref 6–20)
CO2: 26 mmol/L (ref 22–32)
Calcium: 9.2 mg/dL (ref 8.9–10.3)
Chloride: 98 mmol/L (ref 98–111)
Creatinine, Ser: 0.93 mg/dL (ref 0.44–1.00)
GFR, Estimated: >60 mL/min (ref >60)
Glucose, Bld: 110 mg/dL — ABNORMAL HIGH (ref 70–99)
Potassium: 3.6 mmol/L (ref 3.5–5.1)
Sodium: 135 mmol/L (ref 135–145)
Total Bilirubin: 0.7 mg/dL (ref 0.3–1.2)
Total Protein: 6.7 g/dL (ref 6.5–8.1)

## 2021-01-25 LAB — CBC WITH DIFFERENTIAL/PLATELET
Abs Immature Granulocytes: 0.03 10*3/uL (ref 0.00–0.07)
Basophils Absolute: 0.1 10*3/uL (ref 0.0–0.1)
Basophils Relative: 1 %
Eosinophils Absolute: 0.3 10*3/uL (ref 0.0–0.5)
Eosinophils Relative: 3 %
HCT: 43.3 % (ref 36.0–46.0)
Hemoglobin: 14.1 g/dL (ref 12.0–15.0)
Immature Granulocytes: 0 %
Lymphocytes Relative: 26 %
Lymphs Abs: 2.2 10*3/uL (ref 0.7–4.0)
MCH: 28.9 pg (ref 26.0–34.0)
MCHC: 32.6 g/dL (ref 30.0–36.0)
MCV: 88.7 fL (ref 80.0–100.0)
Monocytes Absolute: 0.8 10*3/uL (ref 0.1–1.0)
Monocytes Relative: 9 %
Neutro Abs: 5.2 10*3/uL (ref 1.7–7.7)
Neutrophils Relative %: 61 %
Platelets: 342 10*3/uL (ref 150–400)
RBC: 4.88 MIL/uL (ref 3.87–5.11)
RDW: 13.2 % (ref 11.5–15.5)
WBC: 8.5 10*3/uL (ref 4.0–10.5)
nRBC: 0 % (ref 0.0–0.2)

## 2021-01-25 LAB — PHOSPHORUS: Phosphorus: 5.2 mg/dL — ABNORMAL HIGH (ref 2.5–4.6)

## 2021-01-25 LAB — MAGNESIUM: Magnesium: 1.8 mg/dL (ref 1.7–2.4)

## 2021-01-25 MED ORDER — SPIRONOLACTONE 25 MG PO TABS
12.5000 mg | ORAL_TABLET | Freq: Every day | ORAL | 0 refills | Status: DC
  Filled 2021-01-25: qty 30, 60d supply, fill #0

## 2021-01-25 MED ORDER — POTASSIUM CHLORIDE CRYS ER 20 MEQ PO TBCR
EXTENDED_RELEASE_TABLET | ORAL | 1 refills | Status: DC
  Filled 2021-01-25: qty 60, 30d supply, fill #0

## 2021-01-25 MED ORDER — ATORVASTATIN CALCIUM 80 MG PO TABS
80.0000 mg | ORAL_TABLET | Freq: Every day | ORAL | 0 refills | Status: DC
  Filled 2021-01-25: qty 30, 30d supply, fill #0

## 2021-01-25 MED ORDER — POTASSIUM CHLORIDE CRYS ER 20 MEQ PO TBCR
40.0000 meq | EXTENDED_RELEASE_TABLET | Freq: Once | ORAL | Status: AC
  Administered 2021-01-25: 40 meq via ORAL
  Filled 2021-01-25: qty 2

## 2021-01-25 MED ORDER — NICOTINE 21 MG/24HR TD PT24
21.0000 mg | MEDICATED_PATCH | Freq: Every day | TRANSDERMAL | 0 refills | Status: DC
  Filled 2021-01-25: qty 28, 28d supply, fill #0

## 2021-01-25 MED ORDER — DAPAGLIFLOZIN PROPANEDIOL 10 MG PO TABS
10.0000 mg | ORAL_TABLET | Freq: Every day | ORAL | 0 refills | Status: DC
  Filled 2021-01-25: qty 30, 30d supply, fill #0

## 2021-01-25 MED ORDER — OMEPRAZOLE 20 MG PO CPDR
20.0000 mg | DELAYED_RELEASE_CAPSULE | Freq: Two times a day (BID) | ORAL | 0 refills | Status: DC
  Filled 2021-01-25: qty 28, 14d supply, fill #0

## 2021-01-25 MED ORDER — ASPIRIN 81 MG PO TBEC
81.0000 mg | DELAYED_RELEASE_TABLET | Freq: Every day | ORAL | 11 refills | Status: DC
  Filled 2021-01-25: qty 30, 30d supply, fill #0

## 2021-01-25 MED ORDER — HYDROCODONE-ACETAMINOPHEN 5-325 MG PO TABS
1.0000 | ORAL_TABLET | ORAL | 0 refills | Status: AC | PRN
  Filled 2021-01-25: qty 12, 2d supply, fill #0

## 2021-01-25 MED ORDER — DIGOXIN 125 MCG PO TABS
0.1250 mg | ORAL_TABLET | Freq: Every day | ORAL | 0 refills | Status: DC
  Filled 2021-01-25: qty 30, 30d supply, fill #0

## 2021-01-25 MED ORDER — APIXABAN 5 MG PO TABS
5.0000 mg | ORAL_TABLET | Freq: Two times a day (BID) | ORAL | 0 refills | Status: DC
  Filled 2021-01-25: qty 60, 30d supply, fill #0

## 2021-01-25 MED ORDER — MAGNESIUM SULFATE 2 GM/50ML IV SOLN
2.0000 g | Freq: Once | INTRAVENOUS | Status: AC
  Administered 2021-01-25: 2 g via INTRAVENOUS
  Filled 2021-01-25: qty 50

## 2021-01-25 MED ORDER — FUROSEMIDE 40 MG PO TABS
ORAL_TABLET | ORAL | 1 refills | Status: DC
  Filled 2021-01-25: qty 30, 30d supply, fill #0

## 2021-01-25 NOTE — Progress Notes (Signed)
Pt has been ambulating independently without CP or SOB. In depth discussion with pt and sister re: HF management, tobacco cessation, diet specifically low sodium, exercise, and CRPII. Pt very receptive. Motivated to quit smoking. Will refer to [facility] CES, ACSM 2:14 PM 01/25/2021

## 2021-01-25 NOTE — TOC Initial Note (Addendum)
Transition of Care [facility]) - Initial/Assessment Note    Patient Details  Name: Christine [name] MRN: [id number] Date of Birth: 11/28/1965  [facility] of [facility]) CM/SW Contact:    Christine Rasher, RN Phone Number: [phone]  01/25/2021, 12:55 PM  Clinical Narrative:                 HF TOC CM spoke to pt at bedside. States she completed application for patient assistance for Eliquis. She has patient assistance for Effexor and uses good rx for other meds. States her insurance will start on Nov 1 from the marketplace. Pt will receive meds from [facility] at dc. Will check meds to see if MATCH need to assist.   Expected Discharge Plan: Home/Self Care Barriers to Discharge: Continued Medical Work up   Patient Goals and CMS Choice        Expected Discharge Plan and Services Expected Discharge Plan: Home/Self Care In-house Referral: Clinical Social Work Discharge Planning Services: CM Consult   Living arrangements for the past 2 months: [place]                                      Prior Living Arrangements/Services Living arrangements for the past 2 months: Single Family Home Lives with:: Self Patient language and need for interpreter reviewed:: Yes        Need for Family Participation in Patient Care: No (Comment) Care giver support system in place?: No (comment)   Criminal Activity/Legal Involvement Pertinent to Current Situation/Hospitalization: No - Comment as needed  Activities of Daily Living      Permission Sought/Granted Permission sought to share information with : Case Manager, PCP, Family Supports Permission granted to share information with : Yes, Verbal Permission Granted  Share Information with NAME: Christine [name]     Permission granted to share info w Relationship: sister  Permission granted to share info w Contact Information: [phone]  Emotional Assessment Appearance:: Appears stated age Attitude/Demeanor/Rapport:  Gracious, Engaged Affect (typically observed): Accepting Orientation: : Oriented to Self, Oriented to Place, Oriented to  Time, Oriented to Situation   Psych Involvement: No (comment)  Admission diagnosis:  Shortness of breath [R06.02] CHF (congestive heart failure) (HCC) [I50.9] Chest tightness [R07.89] Patient Active Problem List   Diagnosis Date Noted   CHF (congestive heart failure) ([facility]) 01/20/2021   Anxiety 01/20/2021   Tobacco abuse 01/20/2021   Shortness of breath 01/20/2021   Chest tightness    PCP:  Pcp, No Pharmacy:   [id number] [place], [place] [address]: [phone]: [phone] [facility] [address]Phone: [phone]: [phone]    Social Determinants of Health (SDOH) Interventions    Readmission Risk Interventions No flowsheet data found.

## 2021-01-25 NOTE — Progress Notes (Addendum)
Advanced Heart Failure Rounding Note  PCP-Cardiologist: Skeet Latch, MD   Subjective:    Admitted w/ new systolic HF. Echo EF 20-25%, Apex aneurysmal w/ LV thrombus, RV ok. Mod MR.   R/LHC w/ severe 1v CAD with occlusion of mid LAD. Elevated filling pressures with moderately reduced cardiac output. PCW 27. CI 2.2   cMRI demonstrated subendocardial LGE consistent with prior infarcts in LV basal inferolateral wall, apical anterior/septal/inferior walls and apex. LVEF 22%  IV lasix stopped 10/11. Down 7 lb from admit  SBP 90s-low 100s  Feeling well. No CP, shortness of breath or dizziness. Ambulated halls yesterday. She is eager to go home.   RHC Findings:   RA = not sampled RV = not sampled PA = 36/16 (29) PCW = 27 (v = 35) Fick cardiac output/index = 4.7/2.2 PVR = 0.8 WU Ao at = 99% PA sat = 68%, 65% High SVC = 75%  Objective:   Weight Range: 96.7 kg Body mass index is 30.59 kg/m.   Vital Signs:   Temp:  [97.9 F (36.6 C)-98.1 F (36.7 C)] 97.9 F (36.6 C) (10/12 0600) Pulse Rate:  [86-100] 86 (10/12 0600) Resp:  [18-20] 20 (10/12 0600) BP: (98-106)/(55-67) 104/55 (10/12 0600) SpO2:  [95 %-96 %] 96 % (10/12 0600) Weight:  [96.7 kg] 96.7 kg (10/12 0128) Last BM Date: 01/23/21  Weight change: Filed Weights   01/23/21 0502 01/24/21 0329 01/25/21 0128  Weight: 99.3 kg 97.3 kg 96.7 kg    Intake/Output:   Intake/Output Summary (Last 24 hours) at 01/25/2021 0750 Last data filed at 01/25/2021 0116 Gross per 24 hour  Intake 1247 ml  Output 1000 ml  Net 247 ml      Physical Exam    General:  Sitting up in bed, no acute distress HEENT: normal Neck: supple. no JVD. Carotids 2+ bilat; no bruits.  Cor: PMI nondisplaced. Regular rate & rhythm. No rubs, gallops or murmurs. Lungs: clear Abdomen: soft, nontender, nondistended. No hepatosplenomegaly. No bruits or masses. Good bowel sounds. Extremities: no cyanosis, clubbing, rash, edema Neuro: alert &  orientedx3, cranial nerves grossly intact. moves all 4 extremities w/o difficulty. Affect pleasant   Telemetry   NSR 80s-90s (personally reviewed)   Labs    CBC Recent Labs    01/24/21 0223 01/25/21 0303  WBC 10.6* 8.5  NEUTROABS 8.1* 5.2  HGB 13.7 14.1  HCT 40.6 43.3  MCV 87.5 88.7  PLT 322 258   Basic Metabolic Panel Recent Labs    01/24/21 0223 01/25/21 0303  NA 136 135  K 4.0 3.6  CL 102 98  CO2 25 26  GLUCOSE 106* 110*  BUN 16 15  CREATININE 0.78 0.93  CALCIUM 9.1 9.2  MG 2.0 1.8  PHOS 4.4 5.2*   Liver Function Tests Recent Labs    01/24/21 0223 01/25/21 0303  AST 25 22  ALT 29 25  ALKPHOS 73 80  BILITOT 0.4 0.7  PROT 6.6 6.7  ALBUMIN 3.6 3.7   No results for input(s): LIPASE, AMYLASE in the last 72 hours. Cardiac Enzymes No results for input(s): CKTOTAL, CKMB, CKMBINDEX, TROPONINI in the last 72 hours.  BNP: BNP (last 3 results) Recent Labs    01/20/21 1139  BNP 658.1*    ProBNP (last 3 results) No results for input(s): PROBNP in the last 8760 hours.   D-Dimer No results for input(s): DDIMER in the last 72 hours. Hemoglobin A1C Recent Labs    01/23/21 0418  HGBA1C 5.8*  Fasting Lipid Panel Recent Labs    01/24/21 0223  CHOL 216*  HDL 43  LDLCALC 137*  TRIG 179*  CHOLHDL 5.0   Thyroid Function Tests No results for input(s): TSH, T4TOTAL, T3FREE, THYROIDAB in the last 72 hours.  Invalid input(s): FREET3  Other results:   Imaging    MR CARDIAC MORPHOLOGY W WO CONTRAST  Result Date: 01/24/2021 CLINICAL DATA:  Cardiomyopathy evaluation EXAM: CARDIAC MRI TECHNIQUE: The patient was scanned on a 1.5 Tesla Siemens magnet. A dedicated cardiac coil was used. Functional imaging was done using Fiesta sequences. 2,3, and 4 chamber views were done to assess for RWMA's. Modified Simpson's rule using a short axis stack was used to calculate an ejection fraction on a dedicated work Conservation officer, nature. The patient  received 9 cc of Gadavist. After 10 minutes inversion recovery sequences were used to assess for infiltration and scar tissue. CONTRAST:  9 cc  of Gadavist FINDINGS: Left ventricle: -Severe dilatation -Severe systolic dysfunction -Nonspecific ECV elevation (31%) -Subendocardial LGE consistent with prior infarct in basal inferolateral, apical anterior/septal/inferior and apex. LGE >50% transmural LV EF:  22% (Normal 56-78%) Absolute volumes: LV EDV: 323m (Normal 52-141 mL) LV ESV: 2576m(Normal 13-51 mL) LV SV: 7320mNormal 33-97 mL) CO: 4.4L/min (Normal 2.7-6.0 L/min) Indexed volumes: LV EDV: 152m26m-m (Normal 41-81 mL/sq-m) LV ESV: 118mL29mm (Normal 12-21 mL/sq-m) LV SV: 33mL/59m (Normal 26-56 mL/sq-m) CI: 2.0L/min/sq-m (Normal 1.8-3.8 L/min/sq-m) Right ventricle: Normal size and systolic function RV EF: 52% (N27%al 47-80%) Absolute volumes: RV EDV: 132mL (24mal 58-154 mL) RV ESV: 63mL (N20ml 12-68 mL) RV SV: 69mL (No98m 35-98 mL) CO: 4.2L/min (Normal 2.7-6 L/min) Indexed volumes: RV EDV: 60mL/sq-m15mrmal 48-87 mL/sq-m) RV ESV: 29mL/sq-m 8mmal 11-28 mL/sq-m) RV SV: 32mL/sq-m (55mal 27-57 mL/sq-m) CI: 1.9L/min/sq-m (Normal 1.8-3.8 L/min/sq-m) Left atrium: Mild enlargement Right atrium: Normal size Mitral valve: At least moderate regurgitation visually Aortic valve: No regurgitation Tricuspid valve: Trivial regurgitation Pulmonic valve: No regurgitation Aorta: Dilatation of ascending aorta measuring 38mm Pericar53m: Normal IMPRESSION: 1. Subendocardial late gadolinium enhancement consistent with prior infarcts in LV basal inferolateral wall, apical anterior/septal/inferior walls, and apex. LGE is greater than 50% transmural suggesting these areas are not viable 2.  LV apical thrombus measuring 24mm x 8mm 3.2mvere5m dilatation with severe systolic dysfunction (EF 22%) 4.  Normal03% size and systolic function (EF 52%) 5. Mitral 50%urgitation appears at least moderate visually, was not quantified  Electronically Signed   By: Christopher  ScOswaldo Milian11/2022 22:35     Medications:     Scheduled Medications:  amphetamine-dextroamphetamine  30 mg Oral BID   apixaban  5 mg Oral BID   aspirin EC  81 mg Oral Daily   atorvastatin  80 mg Oral Daily   cyclobenzaprine  20 mg Oral QHS   dapagliflozin propanediol  10 mg Oral Daily   digoxin  0.125 mg Oral Daily   guaiFENesin  1,200 mg Oral BID   lidocaine  1 patch Transdermal Q24H   nicotine  21 mg Transdermal Daily   sodium chloride flush  3 mL Intravenous Q12H   sodium chloride flush  3 mL Intravenous Q12H   spironolactone  12.5 mg Oral Daily   venlafaxine XR  75 mg Oral Q breakfast   zolpidem  5 mg Oral QHS    Infusions:  sodium chloride     sodium chloride      PRN Medications: sodium chloride, sodium chloride, acetaminophen, acetaminophen, ALPRAZolam, benzonatate, ipratropium-albuterol, ondansetron (  ZOFRAN) IV, ondansetron (ZOFRAN) IV, oxyCODONE, sodium chloride flush, sodium chloride flush    Assessment/Plan   1. Acute Systolic HF/New cardiomyopathy: -Admitted with new HF on 10/07 -Echo EF 20-25%, severely dilated LV, RV okay, moderate MR -RHC with elevated filling pressures (PCWP 27 mmHg), moderately reduced cardiac output/index (4.7/2.2) -single vessel CAD with occlusion mid LAD  -cMRI with subendocardial LGE consistent with prior infarcts in LV basal inferolateral wall, apical anterior/septal/inferior walls and apex. No viability. LVEF 22%. RV okay. - Not sure how to explain inferior defects on cMRI  - IV lasix stopped 10/11. Volume appears stable. Will need prescription for prn furosemide at discharge. - Continue digoxin 0.125 mg daily - Continue Farxiga 10 mg daily. A1c 5.8% - No beta blocker yet d/t low CO - Continue spiro 12.5 mg daily - Discontinue adderall. This was discussed with patient - BP too soft for ARNi  - Scr stable - CR consult - should see today   2. CAD: - Single vessel LAD  occlusion on LHC - Troponin not elevated - suggests more chronic disease - Continue statin. - Continue aspirin for at least 1 month   3. Tobacco use: -Cessation advised   4. LV apical thrombus: - Noted on echo/cMRI.  - on Eliquis  5. Mitral valve regurgitation: - Moderate in severity on echo  - Appeared at least moderate on cMRI, but not quantified - Will need ongoing f/u  6. Hypokalemia: - K 3.6 today. Mag 1.8 - Will replace   SDOH TOC consulted to help w/ insurance. Has completed application for Medicaid.   Working on D/C medications with pharmD.   Okay for discharge today from HF perspective: Eliquis 5 mg BID - patient assistance filled out and pending Farxiga 10 mg daily - HF fund Digoxin 0.125 mg daily - HF fund Atorvastatin 80 mg daily - HF fund Spiro 12.5 mg daily - HF fund Furosemide 40 mg as need - HF fund Kdur 40 mEq as needed with furosemide - HF fund  Should stay off adderall  Will arrange f/u in HF clinic. Remain off work at least until f/u visit.  Length of Stay: 5  [facility], [place], PA-C  01/25/2021, 7:50 AM  Advanced Heart Failure Team Pager [phone] (M-F; 7a - 5p)  Please contact [facility] for night-coverage after hours (5p -7a ) and weekends on [email or web address]   Patient seen and examined with the above-signed Advanced Practice Provider and/or Housestaff. I personally reviewed laboratory data, imaging studies and relevant notes. I independently examined the patient and formulated the important aspects of the plan. I have edited the note to reflect any of my changes or salient points. I have personally discussed the plan with the patient and/or family.  Much improved. Eager to go home. No CP, SOB, orthopnea or PND.   cMRI EF 22% with inferior and anterior/apical scar  General:  Well appearing. No resp difficulty HEENT: normal Neck: supple. no JVD. Carotids 2+ bilat; no bruits. No lymphadenopathy or thryomegaly appreciated. Cor: PMI  nondisplaced. Regular rate & rhythm. No rubs, gallops or murmurs. Lungs: clear Abdomen: soft, nontender, nondistended. No hepatosplenomegaly. No bruits or masses. Good bowel sounds. Extremities: no cyanosis, clubbing, rash, edema Neuro: alert & orientedx3, cranial nerves grossly intact. moves all 4 extremities w/o difficulty. Affect pleasant  I have reviewed MRI and cath films with her.I am a bit unclear why she has inferior scar but other LGE makes sense. [place] for d/c today on above meds.   Glori Bickers, MD  1:58 PM

## 2021-01-25 NOTE — TOC CM/SW Note (Addendum)
HF TOC CM scheduled appt for follow up at [place] on 10/24 at 150 pm with PCP.  Notified pt of appt time. Contacted TOC [facility] pharmacy for copay cost of meds. HF fund to cover HF meds, and trial card for Eliquis and Farxiga. Pt will pay cash for prilosec, nicotine patch and narcotic.    [place], Heart Failure TOC CM [phone]

## 2021-01-25 NOTE — Discharge Summary (Addendum)
Physician Discharge Summary  Christine [name] KNL:[id number] DOB: 1965-09-27 DOA: 01/20/2021  PCP: Merryl Hacker, No  Admit date: 01/20/2021 Discharge date: 01/25/2021  Admitted From: Home Disposition: Home   Recommendations for Outpatient Follow-up:  Follow up with PCP in 1-2 weeks Please obtain BMP/CBC in one week Please follow up with cardiology as scheduled  Discharge Condition: Stable CODE STATUS: Full Diet recommendation: Low-salt low-fat fluid restricted cardiac diet as discussed  Brief/Interim Summary: The patient is a 55 year old obese female with a past medical history significant for but not limited to anxiety, depression, ADHD, history of chronic tobacco abuse and smoking who presented with a new onset of cough, shortness of breath, and palpitations in urgent care suspected new onset CHF and sent the patient to the ED. 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  Patient otherwise stable for discharge per cardiology on Eliquis, Farxiga, digoxin, atorvastatin, spironolactone, Lasix, potassium.  Cardiology does recommend discontinuation of Adderall with outpatient follow-up in the heart failure clinic in 2 remain off work until follow-up visit.  Patient's other comorbid conditions including anxiety depression and tobacco abuse are stable, lengthy discussion about need for tobacco cessation at bedside daily.  Patient otherwise stable and agreeable for discharge home  Patient Active Problem List   Diagnosis Date Noted   CHF  (congestive heart failure) ([place]) 01/20/2021   Anxiety 01/20/2021   Tobacco abuse 01/20/2021   Shortness of breath 01/20/2021   Chest tightness        Discharge Instructions  Discharge Instructions     Call MD for:  severe uncontrolled pain   Complete by: As directed    Call MD for:  temperature >100.4   Complete by: As directed    Diet - low sodium heart healthy   Complete by: As directed    Increase activity slowly   Complete by: As directed       Allergies as of 01/25/2021       Reactions   Sulfa Antibiotics Itching   Erythromycin Hives   Sulfamethoxazole-trimethoprim Hives   Tramadol Rash, Other (See Comments)   Urinary retention        Medication List     TAKE these medications    ALPRAZolam 1 MG tablet Commonly known as: XANAX Take 1-2 mg by mouth in the morning, at noon, in the evening, and at bedtime.   amphetamine-dextroamphetamine 30 MG tablet Commonly known as: ADDERALL Take 1 tablet by mouth 2 (two) times daily.   apixaban 5 MG Tabs tablet Commonly known as: ELIQUIS Take 1 tablet (5 mg total) by mouth 2 (two) times daily.   aspirin 81 MG EC tablet Take 1 tablet (81 mg total) by mouth daily. Swallow whole. Start taking on: January 26, 2021   atorvastatin 80 MG tablet Commonly known as: LIPITOR Take 1 tablet (80 mg total) by mouth daily. Start taking on: January 26, 2021   [facility] Nasal Spray 0.05 % nasal spray Generic drug: oxymetazoline Place 2 sprays into both nostrils 2 (two) times daily as needed for congestion.   cyclobenzaprine 10 MG tablet Commonly known  as: FLEXERIL Take 2 tablets by mouth at bedtime.   dapagliflozin propanediol 10 MG Tabs tablet Commonly known as: FARXIGA Take 1 tablet (10 mg total) by mouth daily. Start taking on: January 26, 2021   digoxin 0.125 MG tablet Commonly known as: LANOXIN Take 1 tablet (0.125 mg total) by mouth daily. Start taking on: January 26, 2021   guaiFENesin 600 MG 12 hr  tablet Commonly known as: MUCINEX Take 600 mg by mouth 2 (two) times daily as needed for cough.   HYDROcodone-acetaminophen 5-325 MG tablet Commonly known as: NORCO/VICODIN Take 1 tablet by mouth every 4 (four) hours as needed for up to 3 days for moderate pain.   nicotine 21 mg/24hr patch Commonly known as: NICODERM CQ - dosed in mg/24 hours Place 1 patch (21 mg total) onto the skin daily. Start taking on: January 26, 2021   spironolactone 25 MG tablet Commonly known as: ALDACTONE Take 0.5 tablets (12.5 mg total) by mouth daily. Start taking on: January 26, 2021   venlafaxine 75 MG tablet Commonly known as: EFFEXOR Take 75 mg by mouth 3 (three) times daily with meals.   zolpidem 5 MG tablet Commonly known as: AMBIEN Take 2.5 mg by mouth at bedtime.        Follow-up Information     Primary Care at [facility] [place] Follow up.   Specialty: Family Medicine Why: Please call and schedule a appointment to establish a primary care doctor. They will work with you on finacial issues Contact information: [address], [place] Follow up on 01/31/2021.   Specialty: Cardiology Why: Advanced [facility] at [facility] at 2:30 pm Entrance C, Gar[address] Contact information: [address] [id number] [place] [id number] [phone]               Allergies  Allergen Reactions   Sulfa Antibiotics Itching   Erythromycin Hives   Sulfamethoxazole-Trimethoprim Hives   Tramadol Rash and Other (See Comments)    Urinary retention      Consultations: Cardiology, Dr. Haroldine Laws  Procedures/Studies: DG Chest 2 View  Result Date: 01/20/2021 CLINICAL DATA:  Shortness of breath. EXAM: CHEST - 2 VIEW COMPARISON:  Chest radiograph 05/25/2005 FINDINGS: Patchy airspace opacity at the posterior costophrenic angle. No definite  pleural effusion. No pneumothorax. Heart is normal in size. Visualized skeletal structures are unremarkable. Surgical clips in the upper abdomen. IMPRESSION: Patchy airspace opacity in the posterior costophrenic angle may represent pneumonia in the appropriate clinical context. Electronically Signed   By: Ileana Roup M.D.   On: 01/20/2021 12:28   CT Angio Chest PE W and/or Wo Contrast  Result Date: 01/20/2021 CLINICAL DATA:  Intermittent shortness of breath for 6 days. Question pulmonary embolism. EXAM: CT ANGIOGRAPHY CHEST WITH CONTRAST TECHNIQUE: Multidetector CT imaging of the chest was performed using the standard protocol during bolus administration of intravenous contrast. Multiplanar CT image reconstructions and MIPs were obtained to evaluate the vascular anatomy. CONTRAST:  72m OMNIPAQUE IOHEXOL 350 MG/ML SOLN COMPARISON:  Radiographs 01/20/2021 and 05/25/2005. FINDINGS: Cardiovascular: The pulmonary arteries are well opacified with contrast to the level of the subsegmental branches. There is no evidence of acute pulmonary embolism. Subsegmental assessment mildly limited by breathing artifact. No significant systemic arterial abnormalities are identified, although there is limited opacification of the aorta. Minimal aortic and coronary artery atherosclerosis. The heart size is normal. There  is no pericardial effusion. Mediastinum/Nodes: There are no enlarged mediastinal, hilar or axillary lymph nodes. The thyroid gland, trachea and esophagus demonstrate no significant findings. Lungs/Pleura: There are small bilateral pleural effusions. There is diffuse central airway and interstitial thickening throughout both lungs with patchy ground-glass opacities. There are nodular ground-glass components in both upper lobes, most notably on the right on image 30/7. No confluent airspace opacity. Upper abdomen: The visualized upper abdomen appears unremarkable status post cholecystectomy. Musculoskeletal/Chest  wall: There is no chest wall mass or suspicious osseous finding. Mild degenerative changes in the spine. Review of the MIP images confirms the above findings. IMPRESSION: 1. No evidence of acute pulmonary embolism or other acute vascular findings in the chest. 2. Patchy ground-glass opacities in both lungs with nodular components in the upper lobes, diffuse central airway and interstitial thickening and small bilateral pleural effusions. Findings are nonspecific, but suspicious for viral infection (consider COVID-19 infection). Recommend radiographic follow-up. 3. Mild Aortic Atherosclerosis (ICD10-I70.0). Electronically Signed   By: Richardean Sale M.D.   On: 01/20/2021 14:23   CARDIAC CATHETERIZATION  Result Date: 01/23/2021   Mid LAD to Dist LAD lesion is 99% stenosed.   2nd Sept lesion is 99% stenosed.   Prox Cx to Mid Cx lesion is 20% stenosed.   Ost LM lesion is 20% stenosed.   The left ventricular ejection fraction is less than 25% by visual estimate. 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 Will check cMRI. Medical therapy. Continue diuresis. Add digoxin. Hold b-blocker. Glori Bickers, MD 2:30 PM  DG CHEST PORT 1 VIEW  Result Date: 01/25/2021 CLINICAL DATA:  Shortness of breath. EXAM: PORTABLE CHEST 1 VIEW COMPARISON:  01/23/2021 and CT chest 01/20/2021. FINDINGS: Trachea is midline. Heart size is enlarged. Question developing right suprahilar airspace opacification. Mild bibasilar streaky opacification. No airspace consolidation or pleural fluid. IMPRESSION: 1.  Question developing right suprahilar airspace opacification which may be due to pneumonia. 2. Bibasilar atelectasis. Electronically Signed   By: Lorin Picket M.D.   On: 01/25/2021 09:37   DG CHEST PORT 1 VIEW  Result Date: 01/23/2021 CLINICAL DATA:  Shortness of breath, chest tightness EXAM: PORTABLE CHEST 1 VIEW COMPARISON:  01/22/2021 FINDINGS: Similar interstitial changes. No new consolidation. No pleural effusion. No pneumothorax. Stable normal heart size. IMPRESSION: No significant change. Likely chronic interstitial changes. Minimal superimposed interstitial edema remains possible. Electronically Signed   By: Macy Mis M.D.   On: 01/23/2021 08:48   DG CHEST PORT 1 VIEW  Result Date: 01/22/2021 CLINICAL DATA:  55 year old female with a history of shortness of breath EXAM: PORTABLE CHEST 1 VIEW COMPARISON:  01/21/2021 FINDINGS: Cardiomediastinal silhouette unchanged in size and contour. Interlobular septal thickening persists. No new confluent airspace disease. Mild coarsened interstitial markings. No acute displaced fracture. Degenerative changes of the spine. IMPRESSION: Persisting interlobular septal thickening, suggesting persisting edema, with no new confluent airspace disease. Electronically Signed   By: Corrie Mckusick D.O.   On: 01/22/2021 12:43   DG CHEST PORT 1 VIEW  Result Date: 01/21/2021 CLINICAL DATA:  Shortness of breath EXAM: PORTABLE CHEST 1 VIEW COMPARISON:  January 20, 2021 FINDINGS: The heart size and mediastinal contours are stable. Both lungs are clear. Minimal linear atelectasis of both lung bases are identified. The visualized skeletal structures are unremarkable. IMPRESSION: Minimal linear atelectasis of both lung bases. Electronically Signed   By: Abelardo Diesel M.D.   On: 01/21/2021 11:02   MR CARDIAC MORPHOLOGY W WO CONTRAST  Result Date: 01/24/2021 CLINICAL DATA:  Cardiomyopathy evaluation EXAM: CARDIAC MRI TECHNIQUE: The patient was scanned on a 1.5 Tesla  Siemens magnet. A dedicated cardiac coil was used. Functional imaging was done using Fiesta sequences. 2,3, and 4 chamber views were done to assess for RWMA's. Modified Simpson's rule using a short axis stack was used to calculate an ejection fraction on a dedicated work Conservation officer, nature. The patient received 9 cc of Gadavist. After 10 minutes inversion recovery sequences were used to assess for infiltration and scar tissue. CONTRAST:  9 cc  of Gadavist FINDINGS: Left ventricle: -Severe dilatation -Severe systolic dysfunction -Nonspecific ECV elevation (31%) -Subendocardial LGE consistent with prior infarct in basal inferolateral, apical anterior/septal/inferior and apex. LGE >50% transmural LV EF:  22% (Normal 56-78%) Absolute volumes: LV EDV: 313m (Normal 52-141 mL) LV ESV: 2559m(Normal 13-51 mL) LV SV: 7344mNormal 33-97 mL) CO: 4.4L/min (Normal 2.7-6.0 L/min) Indexed volumes: LV EDV: 152m37m-m (Normal 41-81 mL/sq-m) LV ESV: 118mL14mm (Normal 12-21 mL/sq-m) LV SV: 33mL/73m (Normal 26-56 mL/sq-m) CI: 2.0L/min/sq-m (Normal 1.8-3.8 L/min/sq-m) Right ventricle: Normal size and systolic function RV EF: 52% (N67%al 47-80%) Absolute volumes: RV EDV: 132mL (65mal 58-154 mL) RV ESV: 63mL (N53ml 12-68 mL) RV SV: 69mL (No86m 35-98 mL) CO: 4.2L/min (Normal 2.7-6 L/min) Indexed volumes: RV EDV: 60mL/sq-m94mrmal 48-87 mL/sq-m) RV ESV: 29mL/sq-m 31mmal 11-28 mL/sq-m) RV SV: 32mL/sq-m (35mal 27-57 mL/sq-m) CI: 1.9L/min/sq-m (Normal 1.8-3.8 L/min/sq-m) Left atrium: Mild enlargement Right atrium: Normal size Mitral valve: At least moderate regurgitation visually Aortic valve: No regurgitation Tricuspid valve: Trivial regurgitation Pulmonic valve: No regurgitation Aorta: Dilatation of ascending aorta measuring 38mm Pericar42m: Normal IMPRESSION: 1. Subendocardial late gadolinium enhancement consistent with prior infarcts in LV basal inferolateral wall, apical anterior/septal/inferior walls, and apex. LGE is  greater than 50% transmural suggesting these areas are not viable 2.  LV apical thrombus measuring 24mm x 8mm 3.50mvere67m dilatation with severe systolic dysfunction (EF 22%) 4.  Normal34% size and systolic function (EF 52%) 5. Mitral 19%urgitation appears at least moderate visually, was not quantified Electronically Signed   By: Christopher  ScOswaldo Milian11/2022 22:35   ECHOCARDIOGRAM COMPLETE  Result Date: 01/21/2021    ECHOCARDIOGRAM REPORT   Patient Name:   Christine LIVINGSTONISOBELLA ASCHER10/11/2018 Medical Rec #:  [id number]      [id number]      70.0 in Accession #:    [id number]     [id number]     217.0 lb Date of Birth:  03-24-1966      01-15-68      2.161 m Patient Age:    55 years       9P:           109/70 mmHg Patient Gender: F               HR:           99 bpm. Exam Location:  Inpatient Procedure: 2D Echo, Cardiac Doppler, Color Doppler and Intracardiac  Opacification Agent  Discussed results with Dr Alfredia Ferguson at 16:05 on 01/21/21. Indications:    CHF-Acute Systolic K27.06  History:        Patient has no prior history of Echocardiogram examinations.                 Signs/Symptoms:Shortness of Breath.  Sonographer:    Bernadene Person RDCS Referring Phys: [id number] [place]  1. Left ventricular ejection fraction, by estimation, is 20 to 25%. The left ventricle has severely decreased function. The left ventricle demonstrates global hypokinesis. The left ventricular internal cavity size was severely dilated. Left ventricular diastolic parameters are indeterminate.  2. LV apical mural thrombus (best seen images 109-110)  3. Right ventricular systolic function is normal. The right ventricular size is normal. There is normal pulmonary artery systolic pressure.  4. The mitral valve is abnormal. Moderate mitral valve regurgitation. Appears functional.  5. The aortic valve was not well visualized. Aortic valve regurgitation is trivial. No aortic stenosis is present.  6. Aortic  dilatation noted. There is mild dilatation of the ascending aorta, measuring 37 mm.  7. The inferior vena cava is normal in size with <50% respiratory variability, suggesting right atrial pressure of 8 mmHg. FINDINGS  Left Ventricle: Left ventricular ejection fraction, by estimation, is 20 to 25%. The left ventricle has severely decreased function. The left ventricle demonstrates global hypokinesis. Definity contrast agent was given IV to delineate the left ventricular endocardial borders. The left ventricular internal cavity size was severely dilated. There is no left ventricular hypertrophy. Left ventricular diastolic parameters are indeterminate. Right Ventricle: The right ventricular size is normal. No increase in right ventricular wall thickness. Right ventricular systolic function is normal. There is normal pulmonary artery systolic pressure. The tricuspid regurgitant velocity is 1.80 m/s, and  with an assumed right atrial pressure of 8 mmHg, the estimated right ventricular systolic pressure is 15.1 mmHg. Left Atrium: Left atrial size was normal in size. Right Atrium: Right atrial size was normal in size. Pericardium: There is no evidence of pericardial effusion. Mitral Valve: The mitral valve is abnormal. Moderate mitral valve regurgitation. Tricuspid Valve: The tricuspid valve is normal in structure. Tricuspid valve regurgitation is trivial. Aortic Valve: The aortic valve was not well visualized. Aortic valve regurgitation is trivial. No aortic stenosis is present. Pulmonic Valve: The pulmonic valve was grossly normal. Pulmonic valve regurgitation is not visualized. Aorta: The aortic root is normal in size and structure and aortic dilatation noted. There is mild dilatation of the ascending aorta, measuring 37 mm. Venous: The inferior vena cava is normal in size with less than 50% respiratory variability, suggesting right atrial pressure of 8 mmHg. IAS/Shunts: The interatrial septum was not well visualized.   LEFT VENTRICLE PLAX 2D LVIDd:         6.40 cm      Diastology LVIDs:         5.80 cm      LV e' medial:    7.20 cm/s LV PW:         0.90 cm      LV E/e' medial:  24.0 LV IVS:        0.90 cm      LV e' lateral:   10.60 cm/s LVOT diam:     2.10 cm      LV E/e' lateral: 16.3 LV SV:         43 LV SV Index:   20 LVOT Area:     3.46 cm  LV Volumes (MOD) LV vol d, MOD A2C: 272.0 ml LV vol d, MOD A4C: 211.0 ml LV vol s, MOD A2C: 202.0 ml LV vol s, MOD A4C: 166.0 ml LV SV MOD A2C:     70.0 ml LV SV MOD A4C:     211.0 ml LV SV MOD BP:      56.2 ml RIGHT VENTRICLE RV S prime:     9.20 cm/s TAPSE (M-mode): 1.5 cm LEFT ATRIUM             Index        RIGHT ATRIUM           Index LA diam:        3.10 cm 1.43 cm/m   RA Area:     12.30 cm LA Vol (A2C):   66.5 ml 30.77 ml/m  RA Volume:   28.40 ml  13.14 ml/m LA Vol (A4C):   53.1 ml 24.57 ml/m LA Biplane Vol: 62.3 ml 28.83 ml/m  AORTIC VALVE LVOT Vmax:   88.83 cm/s LVOT Vmean:  56.267 cm/s LVOT VTI:    0.123 m  AORTA Ao Root diam: 3.60 cm Ao Asc diam:  3.70 cm MITRAL VALVE                  TRICUSPID VALVE MV Area (PHT): 6.96 cm       TR Peak grad:   13.0 mmHg MV Decel Time: 109 msec       TR Vmax:        180.00 cm/s MR Peak grad:    76.7 mmHg MR Mean grad:    49.0 mmHg    SHUNTS MR Vmax:         438.00 cm/s  Systemic VTI:  0.12 m MR Vmean:        326.5 cm/s   Systemic Diam: 2.10 cm MR PISA:         2.26 cm MR PISA Eff ROA: 20 mm MR PISA Radius:  0.60 cm MV E velocity: 173.00 cm/s MV A velocity: 76.50 cm/s MV E/A ratio:  2.26 Oswaldo Milian MD Electronically signed by Oswaldo Milian MD Signature Date/Time: 01/21/2021/4:09:31 PM    Final      Subjective: No acute issues or events overnight denies nausea vomiting diarrhea constipation headache fevers chills or chest pain   Discharge Exam: Vitals:   01/25/21 0600 01/25/21 1142  BP: (!) 104/55 108/72  Pulse: 86 98  Resp: 20 17  Temp: 97.9 F (36.6 C) 98.2 F (36.8 C)  SpO2: 96% 96%   Vitals:    01/24/21 1954 01/25/21 0128 01/25/21 0600 01/25/21 1142  BP: 98/64  (!) 104/55 108/72  Pulse: 94  86 98  Resp: 18  20 17   Temp: 98.1 F (36.7 C)  97.9 F (36.6 C) 98.2 F (36.8 C)  TempSrc: Oral  Oral Oral  SpO2: 96%  96% 96%  Weight:  96.7 kg    Height:        General: Pt is alert, awake, not in acute distress Cardiovascular: RRR, S1/S2 +, no rubs, no gallops Respiratory: CTA bilaterally, no wheezing, no rhonchi Abdominal: Soft, NT, ND, bowel sounds + Extremities: no edema, no cyanosis    The results of significant diagnostics from this hospitalization (including imaging, microbiology, ancillary and laboratory) are listed below for reference.     Microbiology: Recent Results (from the past 240 hour(s))  Resp Panel by RT-PCR (Flu A&B, Covid) Nasopharyngeal Swab     Status: None  Collection Time: 01/20/21  3:20 PM   Specimen: Nasopharyngeal Swab; Nasopharyngeal(NP) swabs in vial transport medium  Result Value Ref Range Status   SARS Coronavirus 2 by RT PCR NEGATIVE NEGATIVE Final    Comment: (NOTE) SARS-CoV-2 target nucleic acids are NOT DETECTED.  The SARS-CoV-2 RNA is generally detectable in upper respiratory specimens during the acute phase of infection. The lowest concentration of SARS-CoV-2 viral copies this assay can detect is 138 copies/mL. A negative result does not preclude SARS-Cov-2 infection and should not be used as the sole basis for treatment or other patient management decisions. A negative result may occur with  improper specimen collection/handling, submission of specimen other than nasopharyngeal swab, presence of viral mutation(s) within the areas targeted by this assay, and inadequate number of viral copies(<138 copies/mL). A negative result must be combined with clinical observations, patient history, and epidemiological information. The expected result is Negative.  Fact Sheet for Patients:  EntrepreneurPulse.com.au  Fact  Sheet for Healthcare Providers:  IncredibleEmployment.be  This test is no t yet approved or cleared by the [place] FDA and  has been authorized for detection and/or diagnosis of SARS-CoV-2 by FDA under an Emergency Use Authorization (EUA). This EUA will remain  in effect (meaning this test can be used) for the duration of the COVID-19 declaration under Section 564(b)(1) of the Act, 21 U.S.C.section 360bbb-3(b)(1), unless the authorization is terminated  or revoked sooner.       Influenza A by PCR NEGATIVE NEGATIVE Final   Influenza B by PCR NEGATIVE NEGATIVE Final    Comment: (NOTE) The Xpert Xpress SARS-CoV-2/FLU/RSV plus assay is intended as an aid in the diagnosis of influenza from Nasopharyngeal swab specimens and should not be used as a sole basis for treatment. Nasal washings and aspirates are unacceptable for Xpert Xpress SARS-CoV-2/FLU/RSV testing.  Fact Sheet for Patients: EntrepreneurPulse.com.au  Fact Sheet for Healthcare Providers: IncredibleEmployment.be  This test is not yet approved or cleared by the [place] FDA and has been authorized for detection and/or diagnosis of SARS-CoV-2 by FDA under an Emergency Use Authorization (EUA). This EUA will remain in effect (meaning this test can be used) for the duration of the COVID-19 declaration under Section 564(b)(1) of the Act, 21 U.S.C. section 360bbb-3(b)(1), unless the authorization is terminated or revoked.  Performed at [facility], [address], [place], [place] [address]      Labs: BNP (last 3 results) Recent Labs    01/20/21 1139  BNP 094.7*   Basic Metabolic Panel: Recent Labs  Lab 01/21/21 0318 01/21/21 0926 01/22/21 0101 01/23/21 0627 01/23/21 1354 01/23/21 1401 01/23/21 1416 01/24/21 0223 01/25/21 0303  NA 136  --  137 136 146* 140  144 144 136 135  K 3.4*  --  4.1 4.1 3.6 4.0  3.6 3.4* 4.0 3.6   CL 106  --  107 106  --   --   --  102 98  CO2 22  --  20* 24  --   --   --  25 26  GLUCOSE 117*  --  109* 102*  --   --   --  106* 110*  BUN 9  --  14 14  --   --   --  16 15  CREATININE 0.69  --  0.69 0.68  --   --   --  0.78 0.93  CALCIUM 8.9  --  8.7* 8.6*  --   --   --  9.1 9.2  MG  --  1.7 1.7 1.8  --   --   --  2.0 1.8  PHOS  --  3.4 3.1 3.6  --   --   --  4.4 5.2*   Liver Function Tests: Recent Labs  Lab 01/21/21 0926 01/22/21 0101 01/23/21 0627 01/24/21 0223 01/25/21 0303  AST 18 18 22 25 22   ALT 14 11 22 29 25   ALKPHOS 72 68 66 73 80  BILITOT 1.1 0.5 0.5 0.4 0.7  PROT 6.6 6.1* 6.3* 6.6 6.7  ALBUMIN 3.5 3.3* 3.3* 3.6 3.7   No results for input(s): LIPASE, AMYLASE in the last 168 hours. No results for input(s): AMMONIA in the last 168 hours. CBC: Recent Labs  Lab 01/20/21 1139 01/21/21 0318 01/22/21 0101 01/23/21 0418 01/23/21 1354 01/23/21 1401 01/23/21 1416 01/24/21 0223 01/25/21 0303  WBC 11.7* 11.7* 10.0 8.7  --   --   --  10.6* 8.5  NEUTROABS 8.8*  --  5.8 4.9  --   --   --  8.1* 5.2  HGB 13.8 14.3 13.5 13.6 11.9* 12.9  12.2 11.2* 13.7 14.1  HCT 41.5 42.1 40.8 41.6 35.0* 38.0  36.0 33.0* 40.6 43.3  MCV 86.8 86.8 87.9 89.7  --   --   --  87.5 88.7  PLT 349 349 291 298  --   --   --  322 342   Cardiac Enzymes: No results for input(s): CKTOTAL, CKMB, CKMBINDEX, TROPONINI in the last 168 hours. BNP: Invalid input(s): POCBNP CBG: Recent Labs  Lab 01/21/21 2112  GLUCAP 129*   D-Dimer No results for input(s): DDIMER in the last 72 hours. Hgb A1c Recent Labs    01/23/21 0418  HGBA1C 5.8*   Lipid Profile Recent Labs    01/24/21 0223  CHOL 216*  HDL 43  LDLCALC 137*  TRIG 179*  CHOLHDL 5.0   Thyroid function studies No results for input(s): TSH, T4TOTAL, T3FREE, THYROIDAB in the last 72 hours.  Invalid input(s): FREET3 Anemia work up No results for input(s): VITAMINB12, FOLATE, FERRITIN, TIBC, IRON, RETICCTPCT in the last 72  hours. Urinalysis    Component Value Date/Time   COLORURINE YELLOW 01/01/2008 0920   APPEARANCEUR CLEAR 01/01/2008 0920   LABSPEC 1.015 09/01/2020 1539   PHURINE 6.0 09/01/2020 1539   GLUCOSEU NEGATIVE 09/01/2020 1539   HGBUR NEGATIVE 09/01/2020 1539   BILIRUBINUR NEGATIVE 09/01/2020 1539   KETONESUR NEGATIVE 09/01/2020 1539   PROTEINUR NEGATIVE 09/01/2020 1539   UROBILINOGEN 0.2 09/01/2020 1539   NITRITE NEGATIVE 09/01/2020 1539   LEUKOCYTESUR NEGATIVE 09/01/2020 1539   Sepsis Labs Invalid input(s): PROCALCITONIN,  WBC,  LACTICIDVEN Microbiology Recent Results (from the past 240 hour(s))  Resp Panel by RT-PCR (Flu A&B, Covid) Nasopharyngeal Swab     Status: None   Collection Time: 01/20/21  3:20 PM   Specimen: Nasopharyngeal Swab; Nasopharyngeal(NP) swabs in vial transport medium  Result Value Ref Range Status   SARS Coronavirus 2 by RT PCR NEGATIVE NEGATIVE Final    Comment: (NOTE) SARS-CoV-2 target nucleic acids are NOT DETECTED.  The SARS-CoV-2 RNA is generally detectable in upper respiratory specimens during the acute phase of infection. The lowest concentration of SARS-CoV-2 viral copies this assay can detect is 138 copies/mL. A negative result does not preclude SARS-Cov-2 infection and should not be used as the sole basis for treatment or other patient management decisions. A negative result may occur with  improper specimen collection/handling, submission of specimen other than nasopharyngeal swab, presence of viral mutation(s) within the areas  targeted by this assay, and inadequate number of viral copies(<138 copies/mL). A negative result must be combined with clinical observations, patient history, and epidemiological information. The expected result is Negative.  Fact Sheet for Patients:  EntrepreneurPulse.com.au  Fact Sheet for Healthcare Providers:  IncredibleEmployment.be  This test is no t yet approved or cleared by the  [place] FDA and  has been authorized for detection and/or diagnosis of SARS-CoV-2 by FDA under an Emergency Use Authorization (EUA). This EUA will remain  in effect (meaning this test can be used) for the duration of the COVID-19 declaration under Section 564(b)(1) of the Act, 21 U.S.C.section 360bbb-3(b)(1), unless the authorization is terminated  or revoked sooner.       Influenza A by PCR NEGATIVE NEGATIVE Final   Influenza B by PCR NEGATIVE NEGATIVE Final    Comment: (NOTE) The Xpert Xpress SARS-CoV-2/FLU/RSV plus assay is intended as an aid in the diagnosis of influenza from Nasopharyngeal swab specimens and should not be used as a sole basis for treatment. Nasal washings and aspirates are unacceptable for Xpert Xpress SARS-CoV-2/FLU/RSV testing.  Fact Sheet for Patients: EntrepreneurPulse.com.au  Fact Sheet for Healthcare Providers: IncredibleEmployment.be  This test is not yet approved or cleared by the [place] FDA and has been authorized for detection and/or diagnosis of SARS-CoV-2 by FDA under an Emergency Use Authorization (EUA). This EUA will remain in effect (meaning this test can be used) for the duration of the COVID-19 declaration under Section 564(b)(1) of the Act, 21 U.S.C. section 360bbb-3(b)(1), unless the authorization is terminated or revoked.  Performed at [facility], [address], [place], [place] [address]      Time coordinating discharge: Over 30 minutes  SIGNED:   Little Ishikawa, DO [facility] 01/25/2021, 1:03 PM Pager   If 7PM-7AM, please contact night-coverage [email or web address]

## 2021-01-26 NOTE — Telephone Encounter (Signed)
Sent in application via fax 58/00.  Will follow up.

## 2021-01-27 ENCOUNTER — Emergency Department (HOSPITAL_COMMUNITY): Payer: Medicaid Other

## 2021-01-27 ENCOUNTER — Emergency Department (HOSPITAL_COMMUNITY)
Admission: EM | Admit: 2021-01-27 | Discharge: 2021-01-27 | Disposition: A | Discharge status: Home / Self Care | Payer: Medicaid Other | Attending: Emergency Medicine | Admitting: Emergency Medicine

## 2021-01-27 ENCOUNTER — Encounter (HOSPITAL_COMMUNITY): Payer: Self-pay

## 2021-01-27 ENCOUNTER — Other Ambulatory Visit: Payer: Self-pay

## 2021-01-27 ENCOUNTER — Telehealth (HOSPITAL_COMMUNITY): Payer: Self-pay

## 2021-01-27 DIAGNOSIS — R0602 Shortness of breath: Secondary | ICD-10-CM | POA: Insufficient documentation

## 2021-01-27 DIAGNOSIS — I509 Heart failure, unspecified: Secondary | ICD-10-CM | POA: Insufficient documentation

## 2021-01-27 DIAGNOSIS — I2510 Atherosclerotic heart disease of native coronary artery without angina pectoris: Secondary | ICD-10-CM | POA: Diagnosis not present

## 2021-01-27 DIAGNOSIS — Z7982 Long term (current) use of aspirin: Secondary | ICD-10-CM | POA: Diagnosis not present

## 2021-01-27 DIAGNOSIS — I10 Essential (primary) hypertension: Secondary | ICD-10-CM | POA: Diagnosis not present

## 2021-01-27 DIAGNOSIS — I340 Nonrheumatic mitral (valve) insufficiency: Secondary | ICD-10-CM | POA: Diagnosis not present

## 2021-01-27 DIAGNOSIS — Z7901 Long term (current) use of anticoagulants: Secondary | ICD-10-CM | POA: Diagnosis not present

## 2021-01-27 DIAGNOSIS — R000 Tachycardia, unspecified: Secondary | ICD-10-CM | POA: Diagnosis not present

## 2021-01-27 DIAGNOSIS — Z87891 Personal history of nicotine dependence: Secondary | ICD-10-CM | POA: Insufficient documentation

## 2021-01-27 DIAGNOSIS — I5043 Acute on chronic combined systolic (congestive) and diastolic (congestive) heart failure: Secondary | ICD-10-CM

## 2021-01-27 HISTORY — DX: Heart failure, unspecified: I50.9

## 2021-01-27 LAB — CBC WITH DIFFERENTIAL/PLATELET
Abs Immature Granulocytes: 0.04 10*3/uL (ref 0.00–0.07)
Basophils Absolute: 0.1 10*3/uL (ref 0.0–0.1)
Basophils Relative: 0 %
Eosinophils Absolute: 0.1 10*3/uL (ref 0.0–0.5)
Eosinophils Relative: 1 %
HCT: 51.1 % — ABNORMAL HIGH (ref 36.0–46.0)
Hemoglobin: 17.3 g/dL — ABNORMAL HIGH (ref 12.0–15.0)
Immature Granulocytes: 0 %
Lymphocytes Relative: 10 %
Lymphs Abs: 1.3 10*3/uL (ref 0.7–4.0)
MCH: 29.6 pg (ref 26.0–34.0)
MCHC: 33.9 g/dL (ref 30.0–36.0)
MCV: 87.4 fL (ref 80.0–100.0)
Monocytes Absolute: 0.8 10*3/uL (ref 0.1–1.0)
Monocytes Relative: 6 %
Neutro Abs: 11.4 10*3/uL — ABNORMAL HIGH (ref 1.7–7.7)
Neutrophils Relative %: 83 %
Platelets: 403 10*3/uL — ABNORMAL HIGH (ref 150–400)
RBC: 5.85 MIL/uL — ABNORMAL HIGH (ref 3.87–5.11)
RDW: 13.2 % (ref 11.5–15.5)
WBC: 13.7 10*3/uL — ABNORMAL HIGH (ref 4.0–10.5)
nRBC: 0 % (ref 0.0–0.2)

## 2021-01-27 LAB — COMPREHENSIVE METABOLIC PANEL
ALT: 30 U/L (ref 0–44)
AST: 25 U/L (ref 15–41)
Albumin: 4.3 g/dL (ref 3.5–5.0)
Alkaline Phosphatase: 106 U/L (ref 38–126)
Anion gap: 11 (ref 5–15)
BUN: 14 mg/dL (ref 6–20)
CO2: 23 mmol/L (ref 22–32)
Calcium: 9.7 mg/dL (ref 8.9–10.3)
Chloride: 102 mmol/L (ref 98–111)
Creatinine, Ser: 0.88 mg/dL (ref 0.44–1.00)
GFR, Estimated: >60 mL/min (ref >60)
Glucose, Bld: 115 mg/dL — ABNORMAL HIGH (ref 70–99)
Potassium: 4.4 mmol/L (ref 3.5–5.1)
Sodium: 136 mmol/L (ref 135–145)
Total Bilirubin: 0.7 mg/dL (ref 0.3–1.2)
Total Protein: 8 g/dL (ref 6.5–8.1)

## 2021-01-27 LAB — TROPONIN I (HIGH SENSITIVITY)
Troponin I (High Sensitivity): 14 ng/L (ref <18)
Troponin I (High Sensitivity): 13 ng/L (ref <18)

## 2021-01-27 LAB — BRAIN NATRIURETIC PEPTIDE: B Natriuretic Peptide: 207.1 pg/mL — ABNORMAL HIGH (ref 0.0–100.0)

## 2021-01-27 LAB — I-STAT BETA HCG BLOOD, ED (MC, WL, AP ONLY): I-stat hCG, quantitative: 7 m[IU]/mL — ABNORMAL HIGH (ref <5)

## 2021-01-27 MED ORDER — ACETAMINOPHEN 500 MG PO TABS
1000.0000 mg | ORAL_TABLET | Freq: Once | ORAL | Status: AC
  Administered 2021-01-27: 1000 mg via ORAL
  Filled 2021-01-27: qty 2

## 2021-01-27 MED ORDER — POTASSIUM CHLORIDE CRYS ER 20 MEQ PO TBCR: 40.0000 meq | EXTENDED_RELEASE_TABLET | Freq: Every day | ORAL | 1 refills | Status: DC

## 2021-01-27 MED ORDER — HYDROXYZINE HCL 25 MG PO TABS
25.0000 mg | ORAL_TABLET | Freq: Once | ORAL | Status: DC
  Filled 2021-01-27: qty 1

## 2021-01-27 MED ORDER — FUROSEMIDE 40 MG PO TABS: 40.0000 mg | ORAL_TABLET | Freq: Every day | ORAL | 1 refills | Status: DC

## 2021-01-27 MED ORDER — FUROSEMIDE 10 MG/ML IJ SOLN
40.0000 mg | Freq: Once | INTRAMUSCULAR | Status: AC
  Administered 2021-01-27: 40 mg via INTRAVENOUS
  Filled 2021-01-27: qty 4

## 2021-01-27 MED ORDER — LORAZEPAM 1 MG PO TABS
1.0000 mg | ORAL_TABLET | Freq: Once | ORAL | Status: AC
  Administered 2021-01-27: 1 mg via ORAL
  Filled 2021-01-27: qty 1

## 2021-01-27 NOTE — ED Provider Notes (Signed)
Clinical Course as of 01/27/21 2015  Fri Jan 27, 2021  1717 Patient has been seen by cardiology.  Per cardiology, the patient is not interested in repeat admission.  She is concerned about financial strain.  Was told to take Lasix as needed per her hospital discharge instructions.  Cardiology believes that this medication should, instead, be dosed daily.  She will be given a dose of IV Lasix in the ED.  Will observe for 1 to 2 hours for diuresis.  She has follow-up with cardiology on Tuesday; cardiology service feels patient is stable to maintain his outpatient follow-up. [KH]  2008 Patient with 500 cc of fluid diuresed while in the emergency department.  She has been reassessed and is hemodynamically stable.  Tachycardia present while inpatient and at apparent baseline. She denies shortness of breath, chest pain.  She expresses continued comfort with plan for discharge.  Reliable for follow-up with cardiology on Tuesday. [KH]    Clinical Course User Index [KH] Ernst Breach, PA-C 01/27/21 2015    Godfrey Pick, MD 01/29/21 [phone]

## 2021-01-27 NOTE — ED Triage Notes (Signed)
Pt arrives from home via EMS. Pt reports sob at rest, onset 1100 today. Pt has recent CHF diagnosis, was discharged from [facility] 2 days ago, admitted for same. Pt also reports short episode of sob last night, took lasix and it resolved. Pt report no resolution with medications today. Pt denies cp, dizziness. She reports nothing makes it worse, nothing makes it better. Pt ambulated to stretcher with no increased WOB. BP 112/60-(pt reports SBP in the 90's at baseline) HR 108 RR 20 O2 98% on RA CBG 127

## 2021-01-27 NOTE — ED Notes (Signed)
Pt reports that she took lasix and K today and has been urinating

## 2021-01-27 NOTE — Discharge Instructions (Signed)
Take your prescribed Lasix daily.  Avoid intake of salt and processed foods.  Call your Cardiology office to inquire about specifics of your fluid restriction pending your scheduled follow up visit on Tuesday. Return for new or concerning symptoms.

## 2021-01-27 NOTE — Telephone Encounter (Signed)
Pt is currently in the hospital, will contact pt at a later time to see if she is interested in the cardiac rehab.

## 2021-01-27 NOTE — ED Notes (Signed)
Help get patient undressed on the monitor did ekg shown to er provider patient is resting with call bell in reach

## 2021-01-27 NOTE — ED Provider Notes (Signed)
[facility] EMERGENCY DEPARTMENT Provider Note   CSN: [id number] Arrival date & time: 01/27/21  1159     History Chief Complaint  Patient presents with   Shortness of Breath    Christine [name] is a 55 y.o. female.  HPI Patient is a 55 year old female with past medical history significant for smoking, CHF, anxiety  Notably had a heart catheterization 4 days ago with new systolic heart failure EF 20-25% and moderate MR.  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   That she has been having shortness of breath since approximately 11 AM this morning.  Denies any coughing or hemoptysis denies any pain in her chest she states that her shortness of breath occurs at rest and seems to be somewhat worse with exertion.  She was started on multiple medications during her hospital stay and was discharged 2 days ago.  She states that she has been taking her Lasix.  She states that she has some shortness of breath last night which seemed to improve later after she took Lasix.  She denies any lightheadedness or dizziness.    Patient's hospital stay was 5 days.  She was started on Eliquis at that time. VTE.  She had PE study done during hospital stay which was negative.    Past Medical History:  Diagnosis Date   Anxiety 01/20/2021   CHF (congestive heart failure) (HCC)    Shortness of breath 01/20/2021   Tobacco abuse 01/20/2021    Patient Active Problem List   Diagnosis Date Noted   CHF (congestive heart failure) ([place]) 01/20/2021   Anxiety 01/20/2021   Tobacco abuse 01/20/2021   Shortness of breath 01/20/2021   Chest tightness     Past Surgical History:  Procedure Laterality Date   ABDOMINAL HYSTERECTOMY     CHOLECYSTECTOMY     NECK SURGERY     OVARIAN CYST SURGERY     RIGHT/LEFT HEART CATH AND CORONARY ANGIOGRAPHY N/A 01/23/2021   Procedure: RIGHT/LEFT HEART CATH AND CORONARY ANGIOGRAPHY;   Surgeon: Jolaine Artist, MD;  Location: [facility];  Service: Cardiovascular;  Laterality: N/A;     OB History     Gravida  1   Para      Term      Preterm      AB  1   Living         SAB      IAB      Ectopic      Multiple      Live Births              Family History  Problem Relation Age of Onset   Stroke Mother    Atrial fibrillation Mother    Heart failure Father     Social History   Tobacco Use   Smoking status: Former    Packs/day: 0.50    Types: Cigarettes    Quit date: 01/15/2021    Years since quitting: 0.0   Smokeless tobacco: Never  Vaping Use   Vaping Use: Never used  Substance Use Topics   Alcohol use: Yes    Comment: rarely   Drug use: Never    Home Medications Prior to Admission medications   Medication Sig Start Date End Date Taking? Authorizing Provider  ALPRAZolam Duanne Moron) 1 MG tablet Take 1-2 mg by mouth in the morning, at noon, in the evening, and at bedtime. 12/10/17  [provider]  apixaban (ELIQUIS) 5 MG TABS tablet Take 1 tablet (5 mg total) by mouth 2 (two) times daily. 01/25/21   Little Ishikawa, MD  aspirin 81 MG EC tablet Take 1 tablet (81 mg total) by mouth daily. Swallow whole. 01/26/21   Little Ishikawa, MD  atorvastatin (LIPITOR) 80 MG tablet Take 1 tablet (80 mg total) by mouth daily. 01/26/21   Little Ishikawa, MD  cyclobenzaprine (FLEXERIL) 10 MG tablet Take 2 tablets by mouth at bedtime. 08/05/20   [provider]  dapagliflozin propanediol (FARXIGA) 10 MG TABS tablet Take 1 tablet (10 mg total) by mouth daily. 01/26/21   Little Ishikawa, MD  digoxin (LANOXIN) 0.125 MG tablet Take 1 tablet (0.125 mg total) by mouth daily. 01/26/21   Little Ishikawa, MD  furosemide (LASIX) 40 MG tablet Take 1 tablet as needed for leg swelling or shortness of breath 01/25/21   Joette Catching, PA-C  guaiFENesin (MUCINEX) 600 MG 12 hr tablet Take 600 mg by mouth 2 (two)  times daily as needed for cough.    [provider]  HYDROcodone-acetaminophen (NORCO/VICODIN) 5-325 MG tablet Take 1 tablet by mouth every 4 (four) hours as needed for up to 3 days for moderate pain. 01/25/21 01/28/21  Little Ishikawa, MD  nicotine (NICODERM CQ - DOSED IN MG/24 HOURS) 21 mg/24hr patch Place 1 patch (21 mg total) onto the skin daily. 01/26/21   Little Ishikawa, MD  omeprazole (PRILOSEC) 20 MG capsule Take 1 capsule (20 mg total) by mouth 2 (two) times daily for 14 days. 01/25/21 02/08/21  Little Ishikawa, MD  oxymetazoline ([employer] NASAL SPRAY) 0.05 % nasal spray Place 2 sprays into both nostrils 2 (two) times daily as needed for congestion.    [provider]  potassium chloride SA (KLOR-CON) 20 MEQ tablet Take 40 mEq (2 tablets) as needed when taking furosemide 01/25/21   Joette Catching, PA-C  spironolactone (ALDACTONE) 25 MG tablet Take 0.5 tablets (12.5 mg total) by mouth daily. 01/26/21   Little Ishikawa, MD  venlafaxine (EFFEXOR) 75 MG tablet Take 75 mg by mouth 3 (three) times daily with meals.    [provider]  zolpidem (AMBIEN) 5 MG tablet Take 2.5 mg by mouth at bedtime.    [provider]    Allergies    Sulfa antibiotics, Erythromycin, Sulfamethoxazole-trimethoprim, and Tramadol  Review of Systems   Review of Systems  Constitutional:  Negative for chills and fever.  HENT:  Negative for congestion.   Eyes:  Negative for pain.  Respiratory:  Positive for shortness of breath. Negative for cough.   Cardiovascular:  Negative for chest pain and leg swelling.  Gastrointestinal:  Negative for abdominal pain and vomiting.  Genitourinary:  Negative for dysuria.  Musculoskeletal:  Negative for myalgias.  Skin:  Negative for rash.  Neurological:  Negative for dizziness and headaches.   Physical Exam Updated Vital Signs BP 113/70   Pulse 100   Temp 98.2 F (36.8 C)   Resp 20   SpO2 99%   Physical  Exam Vitals and nursing note reviewed.  Constitutional:      General: She is not in acute distress.    Comments: Anxious appearing 55 year old no acute distress.  HENT:     Head: Normocephalic and atraumatic.     Nose: Nose normal.  Eyes:     General: No scleral icterus. Cardiovascular:     Rate and Rhythm: Regular rhythm. Tachycardia  present.     Pulses: Normal pulses.     Heart sounds: Normal heart sounds.     Comments: Mild tachycardia heart rate between 95 and 105 Pulmonary:     Effort: Pulmonary effort is normal. No respiratory distress.     Breath sounds: Rales present. No wheezing.     Comments: Very faint crackles in right base.  No increased work of breathing, speaking full sentences, no tachypnea. Abdominal:     Palpations: Abdomen is soft.     Tenderness: There is no abdominal tenderness.  Musculoskeletal:     Cervical back: Normal range of motion.     Right lower leg: No edema.     Left lower leg: No edema.     Comments: No lower extremity edema or calf tenderness  Skin:    General: Skin is warm and dry.     Capillary Refill: Capillary refill takes less than 2 seconds.  Neurological:     Mental Status: She is alert. Mental status is at baseline.  Psychiatric:        Mood and Affect: Mood normal.        Behavior: Behavior normal.    ED Results / Procedures / Treatments   Labs (all labs ordered are listed, but only abnormal results are displayed) Labs Reviewed  CBC WITH DIFFERENTIAL/PLATELET - Abnormal; Notable for the following components:      Result Value   WBC 13.7 (*)    RBC 5.85 (*)    Hemoglobin 17.3 (*)    HCT 51.1 (*)    Platelets 403 (*)    Neutro Abs 11.4 (*)    All other components within normal limits  COMPREHENSIVE METABOLIC PANEL - Abnormal; Notable for the following components:   Glucose, Bld 115 (*)    All other components within normal limits  BRAIN NATRIURETIC PEPTIDE - Abnormal; Notable for the following components:   B Natriuretic  Peptide 207.1 (*)    All other components within normal limits  I-STAT BETA HCG BLOOD, ED ([facility], [facility], [facility] ONLY) - Abnormal; Notable for the following components:   I-stat hCG, quantitative 7.0 (*)    All other components within normal limits  CULTURE, BLOOD (SINGLE)  TROPONIN I (HIGH SENSITIVITY)  TROPONIN I (HIGH SENSITIVITY)    EKG EKG Interpretation  Date/Time:  Friday January 27 2021 12:03:09 EDT Ventricular Rate:  105 PR Interval:  143 QRS Duration: 111 QT Interval:  370 QTC Calculation: 489 R Axis:   59 Text Interpretation: Sinus tachycardia Probable left atrial enlargement Inferior infarct, old Consider anterolateral infarct No significant change since last tracing Confirmed by Dorie Rank [phone]) on 01/27/2021 12:24:41 PM  Radiology DG Chest 2 View  Result Date: 01/27/2021 CLINICAL DATA:  Shortness of breath, recent CHF diagnosis EXAM: CHEST - 2 VIEW COMPARISON:  01/25/2021 FINDINGS: Normal heart size, mediastinal contours, and pulmonary vascularity. Lungs clear. No acute infiltrate, pleural effusion, or pneumothorax. Levoconvex upper thoracic scoliosis. No acute osseous findings. IMPRESSION: No acute abnormalities. Questioned RIGHT upper lobe opacity on previous exam less prominent, felt to represent RIGHT first costochondral junction and costal cartilage. Electronically Signed   By: Lavonia Dana M.D.   On: 01/27/2021 13:26    Procedures Procedures   Medications Ordered in ED Medications  acetaminophen (TYLENOL) tablet 1,000 mg (1,000 mg Oral Given 01/27/21 1310)  LORazepam (ATIVAN) tablet 1 mg (1 mg Oral Given 01/27/21 1531)    ED Course  I have reviewed the triage vital signs and the nursing notes.  Pertinent labs & imaging results that were available during my care of the patient were reviewed by me and considered in my medical decision making (see chart for details).    MDM Rules/Calculators/A&P                          Patient is a 55 year old female with past  medical history detailed in HPI  She had what appears to be diffuse CAD I do not see any mention of stents placed during her last hospital visit.  She is prescribed anticoagulation and she states that she is taking all of her medications making pulmonary embolism very unlikely.  She is not having chest pain either but only shortness of breath.  BNP only marginally elevated.  Faint crackles auscultated in right lung base but no other crackles elsewhere.  No lower extremity edema.  CBC with mild leukocytosis mild erythrocytosis mild elevation in platelets consistent with perhaps some overdiuresis/dehydration.  Very marginally tachycardic.  Symptoms may be anxiety driven/multifactorial with some dehydration.  Will trend troponins.  Initial troponin within normal limits.  CMP unremarkable EKG nonischemic.  Sinus tachycardia.  Chest x-ray without infiltrate or abnormality.  Patient care handed off to [facility] who will follow-up on second troponin discuss with cardiology.  Final Clinical Impression(s) / ED Diagnoses Final diagnoses:  Shortness of breath    Rx / DC Orders ED Discharge Orders     None        Tedd Sias, [place] 01/27/21 1559    Dorie Rank, MD 01/28/21 [phone]

## 2021-01-27 NOTE — Progress Notes (Signed)
Cardiology Consultation:   Patient ID: Christine [name] MRN: [id number]; DOB: Aug 16, 1965  Admit date: 01/27/2021 Date of Consult: 01/27/2021  PCP:  Christine [name]   [facility] Providers Cardiologist:  Christine Latch, MD        Patient Profile:   Christine [name] is a 55 y.o. 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 Tomi [name].  History of Present Illness:   Christine [name] was discharged from the hospital about 48 hours ago, after being diagnosed with acute systolic and diastolic heart failure with severely depressed left ventricular ejection fraction (EF 20%) due to combined ischemic and nonischemic cardiomyopathy (occluded mid LAD artery with extensive apical scar, LV reduction disproportionate to the extent of CAD).  Due to relatively low blood pressure her heart failure medications were relatively limited: Farxiga, low-dose spironolactone, digoxin, with furosemide prescribed only "as needed" for shortness of breath.  She developed PND 2 nights ago, took furosemide and potassium and felt better.  She developed severe shortness of breath at rest again this morning, but did not improve after taking oral furosemide and potassium.  She therefore came to the emergency room.  She is a Christine more anxious and emotional than I remember her from her recent hospitalization.  She denies any chest pain during any of these events.  She has not had dizziness, palpitations or syncope.  She does not want to be hospitalized under any circumstances.  Currently feeling better, sitting up in bed.  Her blood pressure has been approximately 110/70.  She is mildly tachycardic around 100-110.  Her BNP is actually substantially improved compared to the recent hospitalization, down from 658 to 207.  Potassium and renal function parameters are completely normal.  Cardiac  enzymes are normal.  ECG showed mild sinus tachycardia, nonspecific intraventricular conduction delay with Q waves in the inferior leads as well as V4-V6, not changed from previous tracing.  There are no acute ischemic changes.   Past Medical History:  Diagnosis Date   Anxiety 01/20/2021   CHF (congestive heart failure) (HCC)    Shortness of breath 01/20/2021   Tobacco abuse 01/20/2021    Past Surgical History:  Procedure Laterality Date   ABDOMINAL HYSTERECTOMY     CHOLECYSTECTOMY     NECK SURGERY     OVARIAN CYST SURGERY     RIGHT/LEFT HEART CATH AND CORONARY ANGIOGRAPHY N/A 01/23/2021   Procedure: RIGHT/LEFT HEART CATH AND CORONARY ANGIOGRAPHY;  Surgeon: Christine Artist, MD;  Location: [name];  Service: Cardiovascular;  Laterality: N/A;     Home Medications:  Prior to Admission medications   Medication Sig Start Date End Date Taking? Authorizing Provider  ALPRAZolam [name]) 1 MG tablet Take 1-2 mg by mouth in the morning, at noon, in the evening, and at bedtime. 12/10/17   [provider]  apixaban (ELIQUIS) 5 MG TABS tablet Take 1 tablet (5 mg total) by mouth 2 (two) times daily. 01/25/21   Christine Ishikawa, MD  aspirin 81 MG EC tablet Take 1 tablet (81 mg total) by mouth daily. Swallow whole. 01/26/21   Christine Ishikawa, MD  atorvastatin (LIPITOR) 80 MG tablet Take 1 tablet (80 mg total) by mouth daily. 01/26/21   Christine Ishikawa, MD  cyclobenzaprine (FLEXERIL) 10 MG tablet Take 2 tablets by mouth at bedtime. 08/05/20   [provider]  dapagliflozin  propanediol (FARXIGA) 10 MG TABS tablet Take 1 tablet (10 mg total) by mouth daily. 01/26/21   Christine Ishikawa, MD  digoxin (LANOXIN) 0.125 MG tablet Take 1 tablet (0.125 mg total) by mouth daily. 01/26/21   Christine Ishikawa, MD  furosemide (LASIX) 40 MG tablet Take 1 tablet as needed for leg swelling or shortness of breath 01/25/21   Christine Catching, PA-C  guaiFENesin  (MUCINEX) 600 MG 12 hr tablet Take 600 mg by mouth 2 (two) times daily as needed for cough.    [provider]  HYDROcodone-acetaminophen (NORCO/VICODIN) 5-325 MG tablet Take 1 tablet by mouth every 4 (four) hours as needed for up to 3 days for moderate pain. 01/25/21 01/28/21  Christine Ishikawa, MD  nicotine (NICODERM CQ - DOSED IN MG/24 HOURS) 21 mg/24hr patch Place 1 patch (21 mg total) onto the skin daily. 01/26/21   Christine Ishikawa, MD  omeprazole (PRILOSEC) 20 MG capsule Take 1 capsule (20 mg total) by mouth 2 (two) times daily for 14 days. 01/25/21 02/08/21  Christine Ishikawa, MD  oxymetazoline ([employer] NASAL SPRAY) 0.05 % nasal spray Place 2 sprays into both nostrils 2 (two) times daily as needed for congestion.    [provider]  potassium chloride SA (KLOR-CON) 20 MEQ tablet Take 40 mEq (2 tablets) as needed when taking furosemide 01/25/21   Christine Catching, PA-C  spironolactone (ALDACTONE) 25 MG tablet Take 0.5 tablets (12.5 mg total) by mouth daily. 01/26/21   Christine Ishikawa, MD  venlafaxine (EFFEXOR) 75 MG tablet Take 75 mg by mouth 3 (three) times daily with meals.    [provider]  zolpidem (AMBIEN) 5 MG tablet Take 2.5 mg by mouth at bedtime.    [provider]    Inpatient Medications: Scheduled Meds:  furosemide  40 mg Intravenous Once   Continuous Infusions:  PRN Meds:   Allergies:    Allergies  Allergen Reactions   Sulfa Antibiotics Itching   Erythromycin Hives   Sulfamethoxazole-Trimethoprim Hives   Tramadol Rash and Other (See Comments)    Urinary retention      Social History:   Social History   Socioeconomic History   Marital status: Single    Spouse name: Not on file   Number of children: Not on file   Years of education: Not on file   Highest education level: Not on file  Occupational History   Not on file  Tobacco Use   Smoking status: Former    Packs/day: 0.50    Types: Cigarettes     Quit date: 01/15/2021    Years since quitting: 0.0   Smokeless tobacco: Never  Vaping Use   Vaping Use: Never used  Substance and Sexual Activity   Alcohol use: Yes    Comment: rarely   Drug use: Never   Sexual activity: Not on file  Other Topics Concern   Not on file  Social History Narrative   Not on file   Social Determinants of Health   Financial Resource Strain: Not on file  Food Insecurity: Not on file  Transportation Needs: Not on file  Physical Activity: Not on file  Stress: Not on file  Social Connections: Not on file  Intimate Partner Violence: Not on file    Family History:    Family History  Problem Relation Age of Onset   Stroke Mother    Atrial fibrillation Mother    Heart failure Father      ROS:  Please see the history of present illness.   All other ROS reviewed and negative.     Physical Exam/Data:   Vitals:   01/27/21 1330 01/27/21 1400 01/27/21 1430 01/27/21 1528  BP:   105/79 113/70  Pulse: 94 (!) 102 (!) 109 100  Resp: 19 17 20 20   Temp:    98.2 F (36.8 C)  TempSrc:      SpO2: 97% 98% 97% 99%   No intake or output data in the 24 hours ending 01/27/21 1718 Last 3 Weights 01/25/2021 01/24/2021 01/23/2021  Weight (lbs) 213 lb 3.2 oz 214 lb 8 oz 219 lb  Weight (kg) 96.707 kg 97.297 kg 99.338 kg     There is no height or weight on file to calculate BMI.  General:  Well nourished, well developed, in no acute distress borderline obese, a Christine teary-eyed HEENT: normal Neck: Difficult to see JVD, but probably normal Vascular: No carotid bruits; Distal pulses 2+ bilaterally Cardiac:  normal S1, S2; RRR; no murmur, summation gallop is heard. Lungs:  clear to auscultation bilaterally, no wheezing, rhonchi or rales  Abd: soft, nontender, no hepatomegaly  Ext: no edema Musculoskeletal:  No deformities, BUE and BLE strength normal and equal Skin: warm and dry  Neuro:  CNs 2-12 intact, no focal abnormalities noted Psych:  Normal affect    EKG:  The EKG was personally reviewed and demonstrates: Sinus tachycardia, Q waves in the inferior leads and V4-V6, no acute ischemic changes Telemetry:  Telemetry was personally reviewed and demonstrates: Sinus tachycardia  Relevant CV Studies: Echocardiogram 01/21/2021   1. Left ventricular ejection fraction, by estimation, is 20 to 25%. The  left ventricle has severely decreased function. The left ventricle  demonstrates global hypokinesis. The left ventricular internal cavity size  was severely dilated. Left ventricular  diastolic parameters are indeterminate.   2. LV apical mural thrombus (best seen images 109-110)   3. Right ventricular systolic function is normal. The right ventricular  size is normal. There is normal pulmonary artery systolic pressure.   4. The mitral valve is abnormal. Moderate mitral valve regurgitation.  Appears functional.   5. The aortic valve was not well visualized. Aortic valve regurgitation  is trivial. No aortic stenosis is present.   6. Aortic dilatation noted. There is mild dilatation of the ascending  aorta, measuring 37 mm.   7. The inferior vena cava is normal in size with <50% respiratory  variability, suggesting right atrial pressure of 8 mmHg.    Right and left heart catheterization 01/23/2021    Mid LAD to Dist LAD lesion is 99% stenosed.   2nd Sept lesion is 99% stenosed.   Prox Cx to Mid Cx lesion is 20% stenosed.   Ost LM lesion is 20% stenosed.   The left ventricular ejection fraction is less than 25% by visual estimate.   Findings:   RA = not sampled RV = not sampled PA = 36/16 (29) PCW = 27 (v = 35) Fick cardiac output/index = 4.7/2.2 PVR = 0.8 WU Ao at = 99% PA sat = 68%, 65% High SVC = 75%   Assessment: Severe 1v CAD with occlusion of mid LAD Ischemic CM EF 20-25% Elevated filling pressures with moderately reduced cardiac output Significant v waves in PCWP tracing suggestive of significant MR vs diastolic  dysfunction   Plan/Discussion:   Degree of LV dysfunction seems out of proportion to CAD. Will check cMRI. Medical therapy. Continue diuresis. Add digoxin. Hold b-blocker.   Cardiac MRI  01/24/2021  1. Subendocardial late gadolinium enhancement consistent with prior infarcts in LV basal inferolateral wall, apical anterior/septal/inferior walls, and apex. LGE is greater than 50% transmural suggesting these areas are not viable   2.  LV apical thrombus measuring 65m x 839m  3.  Severe LV dilatation with severe systolic dysfunction (EF 2282%  4.  Normal RV size and systolic function (EF 5295%  5. Mitral regurgitation appears at least moderate visually, was not quantified  Laboratory Data:  High Sensitivity Troponin:   Recent Labs  Lab 01/20/21 1139 01/20/21 1333 01/27/21 1249 01/27/21 1447  TROPONINIHS 16 18* 14 13     Chemistry Recent Labs  Lab 01/23/21 0627 01/23/21 1354 01/24/21 0223 01/25/21 0303 01/27/21 1249  NA 136   < > 136 135 136  K 4.1   < > 4.0 3.6 4.4  CL 106  --  102 98 102  CO2 24  --  25 26 23   GLUCOSE 102*  --  106* 110* 115*  BUN 14  --  16 15 14   CREATININE 0.68  --  0.78 0.93 0.88  CALCIUM 8.6*  --  9.1 9.2 9.7  MG 1.8  --  2.0 1.8  --   GFRNONAA >60  --  >60 >60 >60  ANIONGAP 6  --  9 11 11    < > = values in this interval not displayed.    Recent Labs  Lab 01/24/21 0223 01/25/21 0303 01/27/21 1249  PROT 6.6 6.7 8.0  ALBUMIN 3.6 3.7 4.3  AST 25 22 25   ALT 29 25 30   ALKPHOS 73 80 106  BILITOT 0.4 0.7 0.7   Lipids  Recent Labs  Lab 01/24/21 0223  CHOL 216*  TRIG 179*  HDL 43  LDLCALC 137*  CHOLHDL 5.0    Hematology Recent Labs  Lab 01/24/21 0223 01/25/21 0303 01/27/21 1249  WBC 10.6* 8.5 13.7*  RBC 4.64 4.88 5.85*  HGB 13.7 14.1 17.3*  HCT 40.6 43.3 51.1*  MCV 87.5 88.7 87.4  MCH 29.5 28.9 29.6  MCHC 33.7 32.6 33.9  RDW 13.3 13.2 13.2  PLT 322 342 403*   Thyroid  Recent Labs  Lab 01/20/21 1814  TSH 1.876   FREET4 1.02    BNP Recent Labs  Lab 01/27/21 1249  BNP 207.1*    DDimer No results for input(s): DDIMER in the last 168 hours.   Radiology/Studies:  DG Chest 2 View  Result Date: 01/27/2021 CLINICAL DATA:  Shortness of breath, recent CHF diagnosis EXAM: CHEST - 2 VIEW COMPARISON:  01/25/2021 FINDINGS: Normal heart size, mediastinal contours, and pulmonary vascularity. Lungs clear. No acute infiltrate, pleural effusion, or pneumothorax. Levoconvex upper thoracic scoliosis. No acute osseous findings. IMPRESSION: No acute abnormalities. Questioned RIGHT upper lobe opacity on previous exam less prominent, felt to represent RIGHT first costochondral junction and costal cartilage. Electronically Signed   By: MaLavonia Dana.D.   On: 01/27/2021 13:26   DG CHEST PORT 1 VIEW  Result Date: 01/25/2021 CLINICAL DATA:  Shortness of breath. EXAM: PORTABLE CHEST 1 VIEW COMPARISON:  01/23/2021 and CT chest 01/20/2021. FINDINGS: Trachea is midline. Heart size is enlarged. Question developing right suprahilar airspace opacification. Mild bibasilar streaky opacification. No airspace consolidation or pleural fluid. IMPRESSION: 1. Question developing right suprahilar airspace opacification which may be due to pneumonia. 2. Bibasilar atelectasis. Electronically Signed   By: MeLorin Picket.D.   On: 01/25/2021 09:37   MR CARDIAC MORPHOLOGY W WO CONTRAST  Result  Date: 01/24/2021 CLINICAL DATA:  Cardiomyopathy evaluation EXAM: CARDIAC MRI TECHNIQUE: The patient was scanned on a 1.5 Tesla Siemens magnet. A dedicated cardiac coil was used. Functional imaging was done using Fiesta sequences. 2,3, and 4 chamber views were done to assess for RWMA's. Modified Simpson's rule using a short axis stack was used to calculate an ejection fraction on a dedicated work Conservation officer, nature. The patient received 9 cc of Gadavist. After 10 minutes inversion recovery sequences were used to assess for infiltration and scar  tissue. CONTRAST:  9 cc  of Gadavist FINDINGS: Left ventricle: -Severe dilatation -Severe systolic dysfunction -Nonspecific ECV elevation (31%) -Subendocardial LGE consistent with prior infarct in basal inferolateral, apical anterior/septal/inferior and apex. LGE >50% transmural LV EF:  22% (Normal 56-78%) Absolute volumes: LV EDV: 357m (Normal 52-141 mL) LV ESV: 2556m(Normal 13-51 mL) LV SV: 7337mNormal 33-97 mL) CO: 4.4L/min (Normal 2.7-6.0 L/min) Indexed volumes: LV EDV: 152m7m-m (Normal 41-81 mL/sq-m) LV ESV: 118mL59mm (Normal 12-21 mL/sq-m) LV SV: 33mL/3m (Normal 26-56 mL/sq-m) CI: 2.0L/min/sq-m (Normal 1.8-3.8 L/min/sq-m) Right ventricle: Normal size and systolic function RV EF: 52% (N96%al 47-80%) Absolute volumes: RV EDV: 132mL (58mal 58-154 mL) RV ESV: 63mL (N15ml 12-68 mL) RV SV: 69mL (No67m 35-98 mL) CO: 4.2L/min (Normal 2.7-6 L/min) Indexed volumes: RV EDV: 60mL/sq-m24mrmal 48-87 mL/sq-m) RV ESV: 29mL/sq-m 83mmal 11-28 mL/sq-m) RV SV: 32mL/sq-m (21mal 27-57 mL/sq-m) CI: 1.9L/min/sq-m (Normal 1.8-3.8 L/min/sq-m) Left atrium: Mild enlargement Right atrium: Normal size Mitral valve: At least moderate regurgitation visually Aortic valve: No regurgitation Tricuspid valve: Trivial regurgitation Pulmonic valve: No regurgitation Aorta: Dilatation of ascending aorta measuring 38mm Pericar70m: Normal IMPRESSION: 1. Subendocardial late gadolinium enhancement consistent with prior infarcts in LV basal inferolateral wall, apical anterior/septal/inferior walls, and apex. LGE is greater than 50% transmural suggesting these areas are not viable 2.  LV apical thrombus measuring 24mm x 8mm 3.15mvere10m dilatation with severe systolic dysfunction (EF 22%) 4.  Normal22% size and systolic function (EF 52%) 5. Mitral 29%urgitation appears at least moderate visually, was not quantified Electronically Signed   By: Christopher  ScOswaldo Milian11/2022 22:35     Assessment and Plan:   CHF: She  describes orthopnea and PND, consistent with persistent hypervolemia.  We will give a dose of intravenous loop diuretic in the emergency room and then schedule a daily dose of diuretic between now and her follow-up appointment she is scheduled for next Tuesday.  I offered hospitalization, but she declines.  Treatment with guideline directed medical therapy will continue to be challenging because of her relatively low blood pressure.  She may ultimately require advanced heart failure therapies.  Again reviewed the importance of sodium dietary restriction, daily weight monitoring, signs and symptoms of heart failure exacerbation. CAD: he just completed infarctions in the mid to-apical LAD distribution and in the distribution of the right coronary artery (although the latter is patent).  There is greater than 50% gadolinium enhancement/scar in these territories, therefore no benefit to revascularization.  She does not have angina pectoris.  The focus at this point is on risk factor modification to prevent future coronary stenoses and infarction. MR: Secondary to the cardiomyopathy.  Reevaluate this after she has had more diuretics and has clearly achieved "dry weight".  Consider TEE and possible MitraClip if it appears to be severe.  RECOMMEND: -Furosemide 40 mg once daily and KCl 40 mEq once daily (rather than just "as needed". Keep appointment scheduled for Tuesday, 02/01/2019   Risk  Assessment/Risk Scores:        New York Heart Association (NYHA) Functional Class NYHA Class IV        For questions or updates, please contact [facility] Please consult [email or web address] for contact info under    Signed, Sanda Klein, MD  01/27/2021 5:18 PM

## 2021-01-30 ENCOUNTER — Other Ambulatory Visit (HOSPITAL_COMMUNITY): Payer: Self-pay | Admitting: *Deleted

## 2021-01-30 ENCOUNTER — Other Ambulatory Visit (HOSPITAL_COMMUNITY): Payer: Self-pay

## 2021-01-30 MED ORDER — DAPAGLIFLOZIN PROPANEDIOL 10 MG PO TABS
10.0000 mg | ORAL_TABLET | Freq: Every day | ORAL | 3 refills | Status: DC
  Filled 2021-01-30 – 2021-02-22 (×2): qty 30, 30d supply, fill #0
  Filled 2021-03-29: qty 30, 30d supply, fill #1
  Filled 2021-04-26: qty 30, 30d supply, fill #2
  Filled 2021-05-25: qty 30, 30d supply, fill #3

## 2021-01-30 NOTE — Progress Notes (Signed)
PCP: None  Cardiology: Dr Oval Linsey  HF Cardiologist: Dr Haroldine Laws  HPI: 55 yo female with history of chronic tobacco use, ADHD, anxiety, depression, CAD,  recenlyt diagnosed with HFrEF . Had [place] in July 2022. ? Drug abuse in the past. Followed by psychiatry in the community for anxiety and depression.    Presented to ED 01/20/21 with increased shortness of breath/tachycardia. Adderrall stopped. Echo with EF 20-25%,  LHC/RHC with single vessel CAD elevated filling pressures and  moderately reduced CO. Digoxin added. Beta blocker stopped.  Discharged to home 01/25/21. Discharge weight 213 pounds.   On 01/27/21 she returned to [facility] ED with increased dyspnea. Diuresed with IV lasix and started on lasix 40 mg po daily.      Today she returns for HF follow up with her brother. Overall feeling fine. Denies SOB/PND/Orthopnea. No chest pain. No bleeding issues. Appetite ok. No fever or chills. Weight at home 207 pounds. Taking all medications. She has been taking lasix 40 mg twice a day.  Lives with her sister. She does not have insurance. Previously worked to Engineer, mining.   Cardiac Testing  Echo 01/21/2021 EF 20-25% LV severely dilated, LV apical mural thrombus, RV okay, moderate MR.  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 LVEF 22%  ROS: All systems negative except as listed in HPI, PMH and Problem List.  SH:  Social History   Socioeconomic History   Marital status: Single    Spouse name: Not on file   Number of children: Not on file   Years of education: Not on file   Highest education level: Not on file  Occupational History   Not on file  Tobacco Use   Smoking status: Former    Packs/day: 0.50    Types: Cigarettes    Quit date: 01/15/2021    Years since quitting: 0.0   Smokeless tobacco: Never   Vaping Use   Vaping Use: Never used  Substance and Sexual Activity   Alcohol use: Yes    Comment: rarely   Drug use: Never   Sexual activity: Not on file  Other Topics Concern   Not on file  Social History Narrative   Not on file   Social Determinants of Health   Financial Resource Strain: Not on file  Food Insecurity: Not on file  Transportation Needs: Not on file  Physical Activity: Not on file  Stress: Not on file  Social Connections: Not on file  Intimate Partner Violence: Not on file    FH:  Family History  Problem Relation Age of Onset   Stroke Mother    Atrial fibrillation Mother    Heart failure Father     Past Medical History:  Diagnosis Date   Anxiety 01/20/2021   CHF (congestive heart failure) (HCC)    Shortness of breath 01/20/2021   Tobacco abuse 01/20/2021    Current Outpatient Medications  Medication Sig Dispense Refill   alprazolam (XANAX) 2 MG tablet Take 2 mg by mouth in the morning, at noon, and at bedtime.     apixaban (ELIQUIS) 5 MG TABS tablet Take 1 tablet (5 mg total) by mouth 2 (two) times daily. 60 tablet 0   aspirin 81 MG EC tablet Take 1 tablet (81 mg total) by mouth daily. Swallow whole. 30 tablet 11   atorvastatin (LIPITOR)  80 MG tablet Take 1 tablet (80 mg total) by mouth daily. 30 tablet 0   cyclobenzaprine (FLEXERIL) 10 MG tablet Take 2 tablets by mouth at bedtime.     dapagliflozin propanediol (FARXIGA) 10 MG TABS tablet Take 1 tablet (10 mg total) by mouth daily. 30 tablet 3   digoxin (LANOXIN) 0.125 MG tablet Take 1 tablet (0.125 mg total) by mouth daily. 30 tablet 0   furosemide (LASIX) 40 MG tablet Take 1 tablet (40 mg total) by mouth daily. Take 1 tablet as needed for leg swelling or shortness of breath 30 tablet 1   guaiFENesin (MUCINEX) 600 MG 12 hr tablet Take 600 mg by mouth 2 (two) times daily as needed for cough.     HYDROcodone-acetaminophen (NORCO/VICODIN) 5-325 MG tablet Take 1 tablet by mouth every 4 (four) hours as  needed for moderate pain.     nicotine (NICODERM CQ - DOSED IN MG/24 HOURS) 21 mg/24hr patch Place 1 patch (21 mg total) onto the skin daily. 28 patch 0   oxymetazoline (AFRIN) 0.05 % nasal spray Place 2 sprays into both nostrils 2 (two) times daily as needed for congestion.     potassium chloride SA (KLOR-CON) 20 MEQ tablet Take 40 mEq by mouth daily.     spironolactone (ALDACTONE) 25 MG tablet Take 0.5 tablets (12.5 mg total) by mouth daily. 30 tablet 0   venlafaxine (EFFEXOR) 75 MG tablet Take 75 mg by mouth 3 (three) times daily with meals.     zolpidem (AMBIEN) 10 MG tablet Take 5 mg by mouth at bedtime.     No current facility-administered medications for this encounter.    Vitals:   01/31/21 1426  BP: 104/70  Pulse: (!) 111  SpO2: 96%  Weight: 94.7 kg (208 lb 12.8 oz)   Wt Readings from Last 3 Encounters:  01/31/21 94.7 kg (208 lb 12.8 oz)  01/25/21 96.7 kg (213 lb 3.2 oz)    PHYSICAL EXAM: General:  Well appearing. No resp difficulty. Walked in the clinic  HEENT: normal Neck: supple. JVP flat. Carotids 2+ bilaterally; no bruits. No lymphadenopathy or thryomegaly appreciated. Cor: PMI normal. Tachy Regular rate & rhythm. No rubs, gallops or murmurs. Lungs: clear Abdomen: soft, nontender, nondistended. No hepatosplenomegaly. No bruits or masses. Good bowel sounds. Extremities: no cyanosis, clubbing, rash, edema Neuro: alert & orientedx3, cranial nerves grossly intact. Moves all 4 extremities w/o difficulty. Affect pleasant.   ECG: Sinus Tach 113 bpm Narrow QRS    ASSESSMENT & PLAN: 1. Chronic HFrEF: -Admitted with new HF on 01/20/21. 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 No viability. LVEF 22%. RV okay.Not sure how to explain inferior defects on cMRI  - NYHA II. Volume status stable. Continue lasix 40 mg po twice a day.  - Add coreg 3.125 mg twice a day. May need to consider ivabradine.  - Continue digoxin 0.125 mg daily - Continue Farxiga 10 mg daily.  - No beta blocker yet d/t low CO - Continue spiro 12.5 mg daily - Last dose of adderall 01/19/21. Needs to remain off adderall with reduced EF - Plan to repeat ECHO in 3 months.  - Check BMET    2. CAD: - Single vessel LAD occlusion on LHC -  Continue  statin. - Continue aspirin for at least 1 month then stop.  - No chest pain.    3. Tobacco use: No longer smoking.   4. LV apical thrombus: - Noted on echo/cMRI.  - on Eliquis. No bleeding issues  -Check CBC    5. Mitral valve regurgitation: - Moderate in severity on echo  - Appeared at least moderate on cMRI, but not quantified   6. Hypokalemia: -  Check BMET    Referred to HFSW for assistance with insurance. She is unable to pay for medications. Will use HF fund for medications. Lengthy discussion with her brother. There is concern about over medication with xanax, benadryl, ambien. She has follow up with new PCP and Psychiatry. I would for them to address.   She has follow up to establish PCP at [place]. Refer to [facility] HF [facility].    Follow up in 3 weeks with pharmacy and 6 weeks with APP. Plan to repeat ECHO in 3 months.   Honestii Marton NP-C  5:22 PM

## 2021-01-30 NOTE — Telephone Encounter (Signed)
Advanced Heart Failure Patient Advocate Encounter   Patient was approved to receive Eliquis from [employer]  Patient ID: [id number] Effective dates: 01/27/21 through 01/26/22  Called and spoke with the patient. Sent 30 day RX request to [place] ([place]) to send to [facility] outpatient pharmacy for [place].   Charlann Boxer, CPhT

## 2021-01-31 ENCOUNTER — Other Ambulatory Visit: Payer: Self-pay

## 2021-01-31 ENCOUNTER — Ambulatory Visit (HOSPITAL_COMMUNITY)
Admission: RE | Admit: 2021-01-31 | Discharge: 2021-01-31 | Disposition: A | Discharge status: Home / Self Care | Payer: Medicaid Other | Source: Ambulatory Visit | Attending: Adult Health | Admitting: Adult Health

## 2021-01-31 ENCOUNTER — Encounter (HOSPITAL_COMMUNITY): Payer: Self-pay

## 2021-01-31 ENCOUNTER — Other Ambulatory Visit (HOSPITAL_COMMUNITY): Payer: Self-pay

## 2021-01-31 VITALS — BP 104/70 | HR 111 | Wt 208.8 lb

## 2021-01-31 DIAGNOSIS — Z87891 Personal history of nicotine dependence: Secondary | ICD-10-CM | POA: Insufficient documentation

## 2021-01-31 DIAGNOSIS — I5022 Chronic systolic (congestive) heart failure: Secondary | ICD-10-CM | POA: Insufficient documentation

## 2021-01-31 DIAGNOSIS — Z7982 Long term (current) use of aspirin: Secondary | ICD-10-CM | POA: Diagnosis not present

## 2021-01-31 DIAGNOSIS — Z8616 Personal history of COVID-19: Secondary | ICD-10-CM | POA: Insufficient documentation

## 2021-01-31 DIAGNOSIS — F419 Anxiety disorder, unspecified: Secondary | ICD-10-CM | POA: Insufficient documentation

## 2021-01-31 DIAGNOSIS — I2510 Atherosclerotic heart disease of native coronary artery without angina pectoris: Secondary | ICD-10-CM | POA: Diagnosis not present

## 2021-01-31 DIAGNOSIS — E876 Hypokalemia: Secondary | ICD-10-CM | POA: Diagnosis not present

## 2021-01-31 DIAGNOSIS — Z720 Tobacco use: Secondary | ICD-10-CM | POA: Diagnosis not present

## 2021-01-31 DIAGNOSIS — I513 Intracardiac thrombosis, not elsewhere classified: Secondary | ICD-10-CM | POA: Insufficient documentation

## 2021-01-31 DIAGNOSIS — Z79899 Other long term (current) drug therapy: Secondary | ICD-10-CM | POA: Insufficient documentation

## 2021-01-31 DIAGNOSIS — I340 Nonrheumatic mitral (valve) insufficiency: Secondary | ICD-10-CM | POA: Diagnosis not present

## 2021-01-31 DIAGNOSIS — I509 Heart failure, unspecified: Secondary | ICD-10-CM | POA: Diagnosis not present

## 2021-01-31 DIAGNOSIS — Z7901 Long term (current) use of anticoagulants: Secondary | ICD-10-CM | POA: Diagnosis not present

## 2021-01-31 DIAGNOSIS — F32A Depression, unspecified: Secondary | ICD-10-CM | POA: Diagnosis not present

## 2021-01-31 DIAGNOSIS — Z8249 Family history of ischemic heart disease and other diseases of the circulatory system: Secondary | ICD-10-CM | POA: Diagnosis not present

## 2021-01-31 LAB — BASIC METABOLIC PANEL
Anion gap: 12 (ref 5–15)
BUN: 28 mg/dL — ABNORMAL HIGH (ref 6–20)
CO2: 28 mmol/L (ref 22–32)
Calcium: 9.4 mg/dL (ref 8.9–10.3)
Chloride: 94 mmol/L — ABNORMAL LOW (ref 98–111)
Creatinine, Ser: 1.09 mg/dL — ABNORMAL HIGH (ref 0.44–1.00)
GFR, Estimated: >60 mL/min (ref >60)
Glucose, Bld: 118 mg/dL — ABNORMAL HIGH (ref 70–99)
Potassium: 3.2 mmol/L — ABNORMAL LOW (ref 3.5–5.1)
Sodium: 134 mmol/L — ABNORMAL LOW (ref 135–145)

## 2021-01-31 LAB — DIGOXIN LEVEL: Digoxin Level: 0.3 ng/mL — ABNORMAL LOW (ref 0.8–2.0)

## 2021-01-31 MED ORDER — CARVEDILOL 3.125 MG PO TABS
3.1250 mg | ORAL_TABLET | Freq: Two times a day (BID) | ORAL | 2 refills | Status: DC
  Filled 2021-01-31: qty 60, 30d supply, fill #0
  Filled 2021-03-02: qty 60, 30d supply, fill #1

## 2021-01-31 NOTE — Progress Notes (Signed)
Heart and Vascular Care Navigation  01/31/2021  Christine [name] [birth year] [id number]  Reason for Referral: CSW requested to assist with insurance.    Engaged with patient face to face for initial visit for Heart and Vascular Care Coordination.                                                                                                   Assessment:  Patient is a 55 yo female who resides in a single family home with her sister.  Patient reports she was working full time until recent hospitalization as a Nurse, learning disability.  She reports she hopes to return to part time status but currently not working. Patient is uninsured and has limited finances with no incoming income at the moment.  Patient's brother reports that they have applied for [employer] insurance and expected to start on 02-14-21. Patient's brother asked appropriate questions regarding disability and the social security process. He states patient's other brother is an Insurance underwriter rep and can assist as needed with social security disability.                                HRT/VAS Care Coordination     Patients Home Cardiology Office Heart Failure Clinic   Outpatient Care Team Social Worker   Social Worker Name: Raquel Sarna, [place] [phone]   Living arrangements for the past 2 months Single Family Home   Lives with: Siblings  sister   Patient Current Insurance Coverage Self-Pay   Patient Has Concern With Paying Medical Bills Yes   Medical Bill Referrals: Will have a policy through [facility] on 16-3-84   Does Patient Have Prescription Coverage? No  Will have a plan on 02-14-21   Patient Prescription Assistance Programs Heart Failure Fund       Social History:                                                                             SDOH Screenings   Alcohol Screen: Not on file  Depression (PHQ2-9): Not on file  Financial Resource Strain: Medium Risk   Difficulty of Paying Living Expenses: Somewhat hard  Food Insecurity: No Food  Insecurity   Worried About Running Out of Food in the Last Year: Never true   Ran Out of Food in the Last Year: Never true  Housing: Low Risk    Last Housing Risk Score: 0  Physical Activity: Not on file  Social Connections: Not on file  Stress: Not on file  Tobacco Use: Medium Risk   Smoking Tobacco Use: Former   Smokeless Tobacco Use: Never  Transportation Needs: No Transportation Needs   Lack of Transportation (Medical): No   Lack of Transportation (Non-Medical): No    SDOH Interventions: Financial  Resources:  Sales promotion account executive Interventions: Development worker, community And family will assist with disability application  Food Insecurity:  Food Insecurity Interventions: Intervention Not Indicated  Housing Insecurity:  Housing Interventions: Intervention Not Indicated  Transportation:   Transportation Interventions: Intervention Not Indicated   Follow-up plan:  CSW will follow up with financial counseling to determine if medicaid application has been started. CSW requested to assist with obtaining a BP cuff and have ordered. Darrick Grinder, NP recommended patient for the [facility] and CSW will forward cuff when arrives to patient home with paramedic. CSW continues to follow for further assistance with navigating  social security and Medicaid if needed. Raquel Sarna, [place], [place]

## 2021-01-31 NOTE — Patient Instructions (Signed)
Labs were done today, if any labs are abnormal the clinic will call you   STOP Aspirin 02/26/2021  START Coreg 3.125 mg 1 tablet twice daily    Your physician recommends that you schedule a follow-up appointment in: 3 weeks and in 6 weeks  At the [facility], you and your health needs are our priority. As part of our continuing mission to provide you with exceptional heart care, we have created designated Provider Care Teams. These Care Teams include your primary Cardiologist (physician) and Advanced Practice Providers (APPs- Physician Assistants and Nurse Practitioners) who all work together to provide you with the care you need, when you need it.   You may see any of the following providers on your designated Care Team at your next follow up: Dr Glori Bickers Dr Loralie Champagne Dr Patrice Paradise, NP Lyda Jester, [place] Ginnie Smart Audry Riles, PharmD   Please be sure to bring in all your medications bottles to every appointment.   If you have any questions or concerns before your next appointment please send [place] a message through [place] or call our office at [phone].    TO LEAVE A MESSAGE FOR THE NURSE SELECT OPTION 2, PLEASE LEAVE A MESSAGE INCLUDING: YOUR NAME DATE OF BIRTH CALL BACK NUMBER REASON FOR CALL**this is important as we prioritize the call backs  YOU WILL RECEIVE A CALL BACK THE SAME DAY AS LONG AS YOU CALL BEFORE 4:00 PM

## 2021-02-01 ENCOUNTER — Telehealth: Payer: Self-pay

## 2021-02-01 LAB — CULTURE, BLOOD (SINGLE): Culture: NO GROWTH

## 2021-02-01 NOTE — Telephone Encounter (Signed)
Returned pt brother call and made aware that pt has an appt with Dr. Wynetta Emery on 10/24 and everything will be discussed at her ov and that we are not able to disclose any information because we don't have a DPR on file. Pt brother states he understands and doesn't have any questions or concerns

## 2021-02-01 NOTE — Telephone Encounter (Signed)
Copied from [place] [phone]. Topic: General - Other >> Feb 01, 2021  8:51 AM Christine [name] wrote: Reason for CRM: The patient's brother has concerns with their alprazolam Christine [name]) 2 MG tablet [[id number]]  prescription and would like for the patient's seen-to-be PCP to be aware of them prior to their appt at [facility]  The patient's brother would like for Dr. Wynetta Emery to address these concerns with the patient if possible   The patient's brother would like for their concerns to remain anonymous if possible, but will speak to clinical staff about them further  Patient has appt with Dr. Wynetta Emery 10/24

## 2021-02-02 ENCOUNTER — Telehealth (HOSPITAL_COMMUNITY): Payer: Self-pay | Admitting: Pharmacist

## 2021-02-02 ENCOUNTER — Other Ambulatory Visit (HOSPITAL_COMMUNITY): Payer: Self-pay

## 2021-02-02 NOTE — Telephone Encounter (Signed)
Pharmacy Transitions of Care Follow-up Telephone Call  Date of discharge: 01/25/21  Discharge Diagnosis: new onset HF  Note: pt arrived at ED and readmitted on 10/14 for SOB/CHF exacerbation.  Was seen 01/31/21 in HF clinic for follow-up.  How have you been since you were released from the hospital?  Overall improving  Medication changes made at discharge:  - START:  Aspirin Low Dose (aspirin)  atorvastatin (LIPITOR)  digoxin (LANOXIN)  Eliquis (apixaban)  nicotine (NICODERM CQ - dosed in mg/24 hours)  spironolactone (ALDACTONE)   - STOPPED: adderall  - CHANGED: n/a  Note: Further Changes at first HF follow-up on 01/31/21 Add Coreg 3.173m BID Farxiga 1539mdaily ASA for 1 mo, then stop 11/15, pt aware  Smoking Cessation - using patches daily, stopped smoking Sunday prior to hospitalization.  Has noticed appetite increasing.  Is trying to manage by eating healthier, but somewhat concerned about weight gain.   Medication changes verified by the patient? Yes, but pt was splitting Eliquis 39m6mabs and taking 2.39mg36mice daily.  I have asked her to begin taking 1 tab (39mg)11mice daily.  She was confused by the rx label and thought she was to split the tabs.     Medication Accessibility:  [place] [facility]  [place] the patient provided with refills on discharged medications? no   [facility]  Pt will now be using [facility] for HF meds using the HF $0 program  Is the patient able to afford medications? No insurance yet Notable copays: Eliquis Eligible patient assistance: HF clinic initiated pt assistance for Eliquis as of 01/24/21, approved on 01/27/21-01/26/22.  Eligible for HF fund for other rxs.  Pt will get ins on 02/14/21.      Medication Review:   APIXABAN (ELIQUIS)  Apixaban 5 mg BID initiated on 01/23/21.  - Discussed importance of taking medication around the same time everyday  -  Reviewed potential DDIs with patient  - Advised patient of medications to avoid (NSAIDs, ASA)  - Educated that Tylenol (acetaminophen) will be the preferred analgesic to prevent risk of bleeding  - Emphasized importance of monitoring for signs and symptoms of bleeding (abnormal bruising, prolonged bleeding, nose bleeds, bleeding from gums, discolored urine, black tarry stools)  - Advised patient to alert all providers of anticoagulation therapy prior to starting a new medication or having a procedure   Follow-up Appointments:  PCP Hospital f/u appt confirmed? [facility]uled to see [facility] on 02/06/21 @ 1:50.   [facility] Scheduled to see Heart and Vasc Clinic on 02/22/21 @ 8:30.  HF Pharmacy visit on 03/15/21 1 pm  If their condition worsens, is the pt aware to call PCP or go to the Emergency Dept.? yes  Final Patient Assessment:   Pt is doing well overall, we reviewed medications and corrected how she was taking Eliquis.  She seems to have a good understanding and was aware of all upcoming appointments. She is aware of the need to ask for refills at next appt, at this time she will be nearly out of medication.

## 2021-02-06 ENCOUNTER — Other Ambulatory Visit: Payer: Self-pay

## 2021-02-06 ENCOUNTER — Ambulatory Visit: Payer: MEDICAID | Attending: Internal Medicine | Admitting: Internal Medicine

## 2021-02-06 ENCOUNTER — Encounter: Payer: Self-pay | Admitting: Internal Medicine

## 2021-02-06 ENCOUNTER — Telehealth (HOSPITAL_COMMUNITY): Payer: Self-pay | Admitting: Licensed Clinical Social Worker

## 2021-02-06 VITALS — BP 106/71 | HR 92 | Resp 16 | Ht 70.0 in | Wt 214.2 lb

## 2021-02-06 DIAGNOSIS — I513 Intracardiac thrombosis, not elsewhere classified: Secondary | ICD-10-CM | POA: Diagnosis not present

## 2021-02-06 DIAGNOSIS — Z7689 Persons encountering health services in other specified circumstances: Secondary | ICD-10-CM | POA: Diagnosis not present

## 2021-02-06 DIAGNOSIS — E669 Obesity, unspecified: Secondary | ICD-10-CM

## 2021-02-06 DIAGNOSIS — Z23 Encounter for immunization: Secondary | ICD-10-CM | POA: Diagnosis not present

## 2021-02-06 DIAGNOSIS — I5020 Unspecified systolic (congestive) heart failure: Secondary | ICD-10-CM

## 2021-02-06 DIAGNOSIS — Z87891 Personal history of nicotine dependence: Secondary | ICD-10-CM | POA: Insufficient documentation

## 2021-02-06 DIAGNOSIS — M546 Pain in thoracic spine: Secondary | ICD-10-CM | POA: Diagnosis not present

## 2021-02-06 DIAGNOSIS — E876 Hypokalemia: Secondary | ICD-10-CM | POA: Diagnosis not present

## 2021-02-06 DIAGNOSIS — Z1231 Encounter for screening mammogram for malignant neoplasm of breast: Secondary | ICD-10-CM | POA: Diagnosis not present

## 2021-02-06 MED ORDER — DICLOFENAC SODIUM 1 % EX GEL
2.0000 g | Freq: Four times a day (QID) | CUTANEOUS | 1 refills | Status: DC
  Filled 2021-02-06: qty 100, 12d supply, fill #0

## 2021-02-06 NOTE — Progress Notes (Signed)
Patient ID: Christine [name], female    DOB: 07/25/1965  MRN: [id number]  CC: New pt visit  Subjective: Christine [name] is a 55 y.o. 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 Found to have new systolic CHF. Echo with EF 20-25% with LV apical thrombus and moderate MR,  LHC/RHC with single vessel CAD elevated filling pressures and  moderately reduced CO. Adderall was discontinued.  Patient was discharged on Eliquis, aspirin, atorvastatin, Farxiga, spironolactone digoxin added. Beta blocker stopped.    Today: She has followed up with cardiology nurse practitioner since hospital discharge.  She denies any PND, orthopnea, lower extremity edema or chest pains.  She reports compliance with taking her medications.  She is now also on carvedilol, potassium supplement and Lasix.  Last BMP showed potassium level of 3.2. Discontinued smoking.  However she states she has gained some weight since she stopped because now she is constantly eating.  White stuff is her weakness.  Reports having back pain just medial to the right shoulder blade that started while in the hospital.  She was given some medication for it and it went away.  However it came back about 2 days ago.  Had surgery on her neck in 2007 and at that time she was told that she has some scoliosis in her back.  Recent chest x-ray revealed some scoliosis in the thoracic spine.  She wonders whether that may be causing the pain.  Was given a Lidoderm patch while in the hospital which helped.  She gives history of ADHD, PTSD, depression and anxiety.  She is followed by psychiatrist Dr. Toy Care. Patient Active Problem List   Diagnosis Date Noted   CHF (congestive heart failure) ([place])  01/20/2021   Anxiety 01/20/2021   Tobacco abuse 01/20/2021   Shortness of breath 01/20/2021   Chest tightness      Current Outpatient Medications on File Prior to Visit  Medication Sig Dispense Refill   alprazolam (XANAX) 2 MG tablet Take 2 mg by mouth in the morning, at noon, and at bedtime.     apixaban (ELIQUIS) 5 MG TABS tablet Take 1 tablet (5 mg total) by mouth 2 (two) times daily. 60 tablet 0   aspirin 81 MG EC tablet Take 1 tablet (81 mg total) by mouth daily. Swallow whole. (Patient not taking: Reported on 02/06/2021) 30 tablet 11   atorvastatin (LIPITOR) 80 MG tablet Take 1 tablet (80 mg total) by mouth daily. 30 tablet 0   carvedilol (COREG) 3.125 MG tablet Take 1 tablet (3.125 mg total) by mouth 2 (two) times daily with a meal. 60 tablet 2   cyclobenzaprine (FLEXERIL) 10 MG tablet Take 2 tablets by mouth at bedtime.     dapagliflozin propanediol (FARXIGA) 10 MG TABS tablet Take 1 tablet (10 mg total) by mouth daily. 30 tablet 3   digoxin (LANOXIN) 0.125 MG tablet Take 1 tablet (0.125 mg total) by mouth daily. 30 tablet 0   furosemide (LASIX) 40 MG tablet Take 1 tablet (40 mg total) by mouth daily. Take 1 tablet as needed for leg swelling or shortness of breath 30 tablet 1   guaiFENesin (MUCINEX) 600 MG 12 hr tablet Take 600 mg by mouth 2 (two)  times daily as needed for cough. (Patient not taking: Reported on 02/06/2021)     HYDROcodone-acetaminophen (NORCO/VICODIN) 5-325 MG tablet Take 1 tablet by mouth every 4 (four) hours as needed for moderate pain.     nicotine (NICODERM CQ - DOSED IN MG/24 HOURS) 21 mg/24hr patch Place 1 patch (21 mg total) onto the skin daily. 28 patch 0   oxymetazoline (AFRIN) 0.05 % nasal spray Place 2 sprays into both nostrils 2 (two) times daily as needed for congestion.     potassium chloride SA (KLOR-CON) 20 MEQ tablet Take 40 mEq by mouth daily.     spironolactone (ALDACTONE) 25 MG tablet Take 0.5 tablets (12.5 mg total) by mouth daily. 30 tablet 0    venlafaxine (EFFEXOR) 75 MG tablet Take 75 mg by mouth 3 (three) times daily with meals.     zolpidem (AMBIEN) 10 MG tablet Take 5 mg by mouth at bedtime.     No current facility-administered medications on file prior to visit.    Allergies  Allergen Reactions   Sulfa Antibiotics Itching   Erythromycin Hives   Sulfamethoxazole-Trimethoprim Hives   Tramadol Rash and Other (See Comments)    Urinary retention      Social History   Socioeconomic History   Marital status: Single    Spouse name: Not on file   Number of children: Not on file   Years of education: Not on file   Highest education level: Not on file  Occupational History   Not on file  Tobacco Use   Smoking status: Former    Packs/day: 0.50    Types: Cigarettes    Quit date: 01/15/2021    Years since quitting: 0.0   Smokeless tobacco: Never  Vaping Use   Vaping Use: Never used  Substance and Sexual Activity   Alcohol use: Yes    Comment: rarely   Drug use: Never   Sexual activity: Not on file  Other Topics Concern   Not on file  Social History Narrative   Not on file   Social Determinants of Health   Financial Resource Strain: Medium Risk   Difficulty of Paying Living Expenses: Somewhat hard  Food Insecurity: No Food Insecurity   Worried About Charity fundraiser in the Last Year: Never true   Ran Out of Food in the Last Year: Never true  Transportation Needs: No Transportation Needs   Lack of Transportation (Medical): No   Lack of Transportation (Non-Medical): No  Physical Activity: Not on file  Stress: Not on file  Social Connections: Not on file  Intimate Partner Violence: Not on file    Family History  Problem Relation Age of Onset   Stroke Mother    Atrial fibrillation Mother    Heart failure Father     Past Surgical History:  Procedure Laterality Date   ABDOMINAL HYSTERECTOMY     CHOLECYSTECTOMY     NECK SURGERY     OVARIAN CYST SURGERY     RIGHT/LEFT HEART CATH AND CORONARY  ANGIOGRAPHY N/A 01/23/2021   Procedure: RIGHT/LEFT HEART CATH AND CORONARY ANGIOGRAPHY;  Surgeon: Jolaine Artist, MD;  Location: [facility];  Service: Cardiovascular;  Laterality: N/A;    ROS: Review of Systems Negative except as stated above  PHYSICAL EXAM: BP 106/71   Pulse 92   Resp 16   Ht 5' 10"  (1.778 m)   Wt 214 lb 3.2 oz (97.2 kg)   SpO2 95%   BMI 30.73 kg/m  Wt Readings from Last 3 Encounters:  02/06/21 214 lb 3.2 oz (97.2 kg)  01/31/21 208 lb 12.8 oz (94.7 kg)  01/25/21 213 lb 3.2 oz (96.7 kg)    Physical Exam  General appearance - alert, well appearing, and in no distress Mental status - normal mood, behavior, speech, dress, motor activity, and thought processes Chest - clear to auscultation, no wheezes, rales or rhonchi, symmetric air entry Heart - normal rate, regular rhythm, normal S1, S2, no murmurs, rubs, clicks or gallops Musculoskeletal -mild point tenderness on palpation of the paraspinal muscles just medial to the lower edge of the right shoulder blade. Extremities - peripheral pulses normal, no pedal edema, no clubbing or cyanosis   Lab Results  Component Value Date   HGBA1C 5.8 (H) 01/23/2021    CMP Latest Ref Rng & Units 01/31/2021 01/27/2021 01/25/2021  Glucose 70 - 99 mg/dL 118(H) 115(H) 110(H)  BUN 6 - 20 mg/dL 28(H) 14 15  Creatinine 0.44 - 1.00 mg/dL 1.09(H) 0.88 0.93  Sodium 135 - 145 mmol/L 134(L) 136 135  Potassium 3.5 - 5.1 mmol/L 3.2(L) 4.4 3.6  Chloride 98 - 111 mmol/L 94(L) 102 98  CO2 22 - 32 mmol/L 28 23 26   Calcium 8.9 - 10.3 mg/dL 9.4 9.7 9.2  Total Protein 6.5 - 8.1 g/dL - 8.0 6.7  Total Bilirubin 0.3 - 1.2 mg/dL - 0.7 0.7  Alkaline Phos 38 - 126 U/L - 106 80  AST 15 - 41 U/L - 25 22  ALT 0 - 44 U/L - 30 25   Lipid Panel     Component Value Date/Time   CHOL 216 (H) 01/24/2021 0223   TRIG 179 (H) 01/24/2021 0223   HDL 43 01/24/2021 0223   CHOLHDL 5.0 01/24/2021 0223   VLDL 36 01/24/2021 0223   LDLCALC  137 (H) 01/24/2021 0223    CBC    Component Value Date/Time   WBC 13.7 (H) 01/27/2021 1249   RBC 5.85 (H) 01/27/2021 1249   HGB 17.3 (H) 01/27/2021 1249   HCT 51.1 (H) 01/27/2021 1249   PLT 403 (H) 01/27/2021 1249   MCV 87.4 01/27/2021 1249   MCH 29.6 01/27/2021 1249   MCHC 33.9 01/27/2021 1249   RDW 13.2 01/27/2021 1249   LYMPHSABS 1.3 01/27/2021 1249   MONOABS 0.8 01/27/2021 1249   EOSABS 0.1 01/27/2021 1249   BASOSABS 0.1 01/27/2021 1249    ASSESSMENT AND PLAN: 1. Encounter to establish care   2. 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, NYHA class 2 ([place]) Compensated.  She will continue current medications including spironolactone, carvedilol furosemide and Farxiga Continue follow-up with cardiology.  3. Left ventricular apical thrombus Patient on Eliquis.  Advised to report any excessive bruising or bleeding  4. Former smoker Commended her on quitting.  Encouraged her to remain tobacco free  5. Acute right-sided thoracic back pain I think this is myofascial pain.  I will try her with Voltaren gel. - diclofenac Sodium (VOLTAREN) 1 % GEL; Apply 2 g topically 4 (four) times daily.  Dispense: 100 g; Refill: 1  6. Obesity (BMI 30.0-34.9) Dietary counseling given.  Advised to cut back on portion sizes of white carbohydrates, eliminate sugary drinks from the diet and drink more water, eat more lean white meat instead of red meat and incorporate fresh fruits and vegetables into the diet.  7. Hypokalemia - Potassium; Future  8. Need for vaccination against Streptococcus pneumoniae Prevnar 20 given.  9. Need for immunization against influenza - Flu  Vaccine QUAD 21moIM (Fluarix, Fluzone & Alfiuria Quad PF)  10. Encounter for screening mammogram for malignant neoplasm of breast - MM Digital Screening; Future   Patient was given the opportunity to ask questions.  Patient verbalized understanding of the plan and was able to repeat key elements of the plan.    No orders of the defined types were placed in this encounter.    Requested Prescriptions    No prescriptions requested or ordered in this encounter    No follow-ups on file.  DKarle Plumber MD, FACP

## 2021-02-06 NOTE — Patient Instructions (Addendum)
Call [email or web address] to get the patches and gum for free.  Healthy Eating Following a healthy eating pattern may help you to achieve and maintain a healthy body weight, reduce the risk of chronic disease, and live a long and productive life. It is important to follow a healthy eating pattern at an appropriate calorie level for your body. Your nutritional needs should be met primarily through food by choosing a variety of nutrient-rich foods. What are tips for following this plan? Reading food labels Read labels and choose the following: Reduced or low sodium. Juices with 100% fruit juice. Foods with low saturated fats and high polyunsaturated and monounsaturated fats. Foods with whole grains, such as whole wheat, cracked wheat, brown rice, and wild rice. Whole grains that are fortified with folic acid. This is recommended for women who are pregnant or who want to become pregnant. Read labels and avoid the following: Foods with a lot of added sugars. These include foods that contain brown sugar, corn sweetener, corn syrup, dextrose, fructose, glucose, high-fructose corn syrup, honey, invert sugar, lactose, malt syrup, maltose, molasses, raw sugar, sucrose, trehalose, or turbinado sugar. Do not eat more than the following amounts of added sugar per day: 6 teaspoons (25 g) for women. 9 teaspoons (38 g) for men. Foods that contain processed or refined starches and grains. Refined grain products, such as white flour, degermed cornmeal, white bread, and white rice. Shopping Choose nutrient-rich snacks, such as vegetables, whole fruits, and nuts. Avoid high-calorie and high-sugar snacks, such as potato chips, fruit snacks, and candy. Use oil-based dressings and spreads on foods instead of solid fats such as butter, stick margarine, or cream cheese. Limit pre-made sauces, mixes, and "instant" products such as flavored rice, instant noodles, and ready-made pasta. Try more plant-protein sources, such as  tofu, tempeh, black beans, edamame, lentils, nuts, and seeds. Explore eating plans such as the Mediterranean diet or vegetarian diet. Cooking Use oil to saut or stir-fry foods instead of solid fats such as butter, stick margarine, or lard. Try baking, boiling, grilling, or broiling instead of frying. Remove the fatty part of meats before cooking. Steam vegetables in water or broth. Meal planning  At meals, imagine dividing your plate into fourths: One-half of your plate is fruits and vegetables. One-fourth of your plate is whole grains. One-fourth of your plate is protein, especially lean meats, poultry, eggs, tofu, beans, or nuts. Include low-fat dairy as part of your daily diet. Lifestyle Choose healthy options in all settings, including home, work, school, restaurants, or stores. Prepare your food safely: Wash your hands after handling raw meats. Keep food preparation surfaces clean by regularly washing with hot, soapy water. Keep raw meats separate from ready-to-eat foods, such as fruits and vegetables. Cook seafood, meat, poultry, and eggs to the recommended internal temperature. Store foods at safe temperatures. In general: Keep cold foods at 43F (4.4C) or below. Keep hot foods at 143F (60C) or above. Keep your freezer at [facility] (-17.8C) or below. Foods are no longer safe to eat when they have been between the temperatures of 40-143F (4.4-60C) for more than 2 hours. What foods should I eat? Fruits Aim to eat 2 cup-equivalents of fresh, canned (in natural juice), or frozen fruits each day. Examples of 1 cup-equivalent of fruit include 1 small apple, 8 large strawberries, 1 cup canned fruit,  cup dried fruit, or 1 cup 100% juice. Vegetables Aim to eat 2-3 cup-equivalents of fresh and frozen vegetables each day, including different varieties and colors. Examples of  1 cup-equivalent of vegetables include 2 medium carrots, 2 cups raw, leafy greens, 1 cup chopped vegetable (raw  or cooked), or 1 medium baked potato. Grains Aim to eat 6 ounce-equivalents of whole grains each day. Examples of 1 ounce-equivalent of grains include 1 slice of bread, 1 cup ready-to-eat cereal, 3 cups popcorn, or  cup cooked rice, pasta, or cereal. Meats and other proteins Aim to eat 5-6 ounce-equivalents of protein each day. Examples of 1 ounce-equivalent of protein include 1 egg, 1/2 cup nuts or seeds, or 1 tablespoon (16 g) peanut butter. A cut of meat or fish that is the size of a deck of cards is about 3-4 ounce-equivalents. Of the protein you eat each week, try to have at least 8 ounces come from seafood. This includes salmon, trout, herring, and anchovies. Dairy Aim to eat 3 cup-equivalents of fat-free or low-fat dairy each day. Examples of 1 cup-equivalent of dairy include 1 cup (240 mL) milk, 8 ounces (250 g) yogurt, 1 ounces (44 g) natural cheese, or 1 cup (240 mL) fortified soy milk. Fats and oils Aim for about 5 teaspoons (21 g) per day. Choose monounsaturated fats, such as canola and olive oils, avocados, peanut butter, and most nuts, or polyunsaturated fats, such as sunflower, corn, and soybean oils, walnuts, pine nuts, sesame seeds, sunflower seeds, and flaxseed. Beverages Aim for six 8-oz glasses of water per day. Limit coffee to three to five 8-oz cups per day. Limit caffeinated beverages that have added calories, such as soda and energy drinks. Limit alcohol intake to no more than 1 drink a day for nonpregnant women and 2 drinks a day for men. One drink equals 12 oz of beer (355 mL), 5 oz of wine (148 mL), or 1 oz of hard liquor (44 mL). Seasoning and other foods Avoid adding excess amounts of salt to your foods. Try flavoring foods with herbs and spices instead of salt. Avoid adding sugar to foods. Try using oil-based dressings, sauces, and spreads instead of solid fats. This information is based on general [place] nutrition guidelines. For more information, visit  [employer].es. Exact amounts may vary based on your nutrition needs. Summary A healthy eating plan may help you to maintain a healthy weight, reduce the risk of chronic diseases, and stay active throughout your life. Plan your meals. Make sure you eat the right portions of a variety of nutrient-rich foods. Try baking, boiling, grilling, or broiling instead of frying. Choose healthy options in all settings, including home, work, school, restaurants, or stores. This information is not intended to replace advice given to you by your health care provider. Make sure you discuss any questions you have with your health care provider. Document Revised: 07/15/2017 Document Reviewed: 07/15/2017 Elsevier Patient Education  [place].

## 2021-02-06 NOTE — Progress Notes (Signed)
Heart and Vascular Care Navigation  02/06/2021  Christine [name] 09-06-1965 [id number]  Reason for Referral: Paramedicine enrollment   Engaged with patient by telephone for follow up visit for Heart and Vascular Care Coordination.                                                                                                   Paramedicine Initial Assessment:  Housing:  In what kind of housing do you live? House/apt/trailer/shelter? house  Do you live with anyone? sister  Are you currently worried about losing your housing? no  Social:  What is your current marital status? single  Do you have family or friends who live locally? Also has brothers one lives out of state but the other lives locally  Reports her sister is her biggest source of support.  Income:  What is your current source of income? None currently  How hard is it for you to pay for the basics like food housing, medical care, and utilities?  Do you have outstanding medical bills? Yes  Insurance:  Are you currently insured? Has ACA insurance starting November 1st  Do you have prescription coverage? No using heart failure fund  If no insurance, have you applied for coverage (Medicaid, disability, marketplace etc)? Was waiting on [employer] to evaluate for Medicaid program- CSW has spoken with them and they want further proof of 12 month disability from a repeat ECHO   Daily Health Needs: Do you have a working scale at home? Yes  How do you manage your medications at home? Take them out of the bottle- has them lined up  Do you ever take your medications differently than prescribed? no  Do you have issues affording your medications? No uses Heart Failure fund and only has to pay for psych meds out of pocket states they are affordable  If yes, has this ever prevented you from obtaining medications? no  Do you have any concerns with mobility at home? no  Do you use any assistive devices at home or have  PCS at home? no  Do you have a PCP? Didn't but has established care appt with [facility] today.  Do you have any trouble reading or writing? no  Are there any additional barriers you see to getting the care you need? no  CSW will continue to follow through paramedicine program and assist as needed.                                  HRT/VAS Care Coordination     Patients Home Cardiology Office [facility]   Outpatient Care Team Social Worker   Social Worker Name: Raquel Sarna, [place] [phone]   Living arrangements for the past 2 months Single Family Home   Lives with: Siblings   Patient Current Insurance Coverage Self-Pay   Patient Has Concern With Paying Medical Bills Yes   Medical Bill Referrals: Will have a policy through [facility] on [id number]   Does Patient Have Prescription Coverage? No  Will have a plan on  02-14-21   Patient Prescription Assistance Programs Heart Failure Fund       Social History:                                                                             SDOH Screenings   Alcohol Screen: Not on file  Depression (PHQ2-9): Not on file  Financial Resource Strain: Medium Risk   Difficulty of Paying Living Expenses: Somewhat hard  Food Insecurity: No Food Insecurity   Worried About Running Out of Food in the Last Year: Never true   Ran Out of Food in the Last Year: Never true  Housing: Low Risk    Last Housing Risk Score: 0  Physical Activity: Not on file  Social Connections: Not on file  Stress: Not on file  Tobacco Use: Medium Risk   Smoking Tobacco Use: Former   Smokeless Tobacco Use: Never   Passive Exposure: Not on file  Transportation Needs: No Transportation Needs   Lack of Transportation (Medical): No   Lack of Transportation (Non-Medical): No    SDOH Interventions: Financial Resources:    Occupational hygienist for [place] for Disability application assistance  Food Insecurity:   None reported   Housing Insecurity:  None reported  Transportation: None reported   Follow-up plan:     CSW sending out referral to paramedics for assignment- they will call pt to set up initial assessment  [name], [facility] Desk#: [phone] Cell#: [phone]

## 2021-02-08 ENCOUNTER — Telehealth (HOSPITAL_COMMUNITY): Payer: Self-pay | Admitting: *Deleted

## 2021-02-08 ENCOUNTER — Ambulatory Visit (HOSPITAL_COMMUNITY)
Admission: RE | Admit: 2021-02-08 | Discharge: 2021-02-08 | Disposition: A | Discharge status: Home / Self Care | Payer: Medicaid Other | Source: Ambulatory Visit | Attending: Internal Medicine | Admitting: Internal Medicine

## 2021-02-08 ENCOUNTER — Other Ambulatory Visit: Payer: Self-pay

## 2021-02-08 DIAGNOSIS — I5042 Chronic combined systolic (congestive) and diastolic (congestive) heart failure: Secondary | ICD-10-CM | POA: Diagnosis not present

## 2021-02-08 LAB — BASIC METABOLIC PANEL
Anion gap: 8 (ref 5–15)
BUN: 15 mg/dL (ref 6–20)
CO2: 30 mmol/L (ref 22–32)
Calcium: 9.4 mg/dL (ref 8.9–10.3)
Chloride: 101 mmol/L (ref 98–111)
Creatinine, Ser: 0.83 mg/dL (ref 0.44–1.00)
GFR, Estimated: >60 mL/min (ref >60)
Glucose, Bld: 96 mg/dL (ref 70–99)
Potassium: 3.5 mmol/L (ref 3.5–5.1)
Sodium: 139 mmol/L (ref 135–145)

## 2021-02-08 NOTE — Telephone Encounter (Signed)
Pt came in for a lab visit and filled out walk in form stating her weight increase 1.5-2.5lbs per day.  Pt is a previous smoker and since quitting shes had an increase in appetite. Pt takes lasix 86m daily but said recently shes taken 429mbid. Pt advised to hold lasix until lab results come back. Pt denies shortness of breath but c/o cramping. Pt takes 4058mof K daily.   Routed to Amy Clegg,NP

## 2021-02-09 ENCOUNTER — Other Ambulatory Visit (HOSPITAL_COMMUNITY): Payer: Self-pay

## 2021-02-09 ENCOUNTER — Other Ambulatory Visit (HOSPITAL_COMMUNITY): Payer: Self-pay | Admitting: *Deleted

## 2021-02-09 MED ORDER — SPIRONOLACTONE 25 MG PO TABS
25.0000 mg | ORAL_TABLET | Freq: Every day | ORAL | 3 refills | Status: DC
  Filled 2021-02-09: qty 30, 30d supply, fill #0
  Filled 2021-03-03 – 2021-03-29 (×2): qty 30, 30d supply, fill #1
  Filled 2021-04-26: qty 30, 30d supply, fill #2
  Filled 2021-05-25: qty 30, 30d supply, fill #3

## 2021-02-09 MED ORDER — FUROSEMIDE 40 MG PO TABS
40.0000 mg | ORAL_TABLET | Freq: Every day | ORAL | 3 refills | Status: DC
  Filled 2021-02-09: qty 30, 30d supply, fill #0
  Filled 2021-02-09: qty 60, 60d supply, fill #0
  Filled 2021-02-17: qty 30, 30d supply, fill #1

## 2021-02-09 NOTE — Telephone Encounter (Signed)
Harvie Junior, [place]  02/09/2021  2:29 PM EDT Back to Top    Pt aware and agreeable with plan.    Conrad Antelope, NP  02/09/2021  2:09 PM EDT     Please call and increase spironolactone to 25 mg daily.    BMET next week.

## 2021-02-09 NOTE — Progress Notes (Signed)
Paramedicine Encounter    Patient ID: Jolicia Delira, female    DOB: [birth year], 54 y.o.   MRN: [id number]   Patient Care Team: Pcp, No as PCP - General Skeet Latch, MD as PCP - Cardiology (Cardiology)  Patient Active Problem List   Diagnosis Date Noted  . Former smoker 02/06/2021  . Left ventricular apical thrombus 02/06/2021  . Obesity (BMI 30.0-34.9) 02/06/2021  . CHF (congestive heart failure) ([place]) 01/20/2021  . Anxiety 01/20/2021  . Tobacco abuse 01/20/2021  . Shortness of breath 01/20/2021  . Chest tightness     Current Outpatient Medications:  .  alprazolam (XANAX) 2 MG tablet, Take 2 mg by mouth in the morning, at noon, and at bedtime., Disp: , Rfl:  .  apixaban (ELIQUIS) 5 MG TABS tablet, Take 1 tablet (5 mg total) by mouth 2 (two) times daily., Disp: 60 tablet, Rfl: 0 .  aspirin 81 MG EC tablet, Take 1 tablet (81 mg total) by mouth daily. Swallow whole., Disp: 30 tablet, Rfl: 11 .  atorvastatin (LIPITOR) 80 MG tablet, Take 1 tablet (80 mg total) by mouth daily., Disp: 30 tablet, Rfl: 0 .  carvedilol (COREG) 3.125 MG tablet, Take 1 tablet (3.125 mg total) by mouth 2 (two) times daily with a meal., Disp: 60 tablet, Rfl: 2 .  cyclobenzaprine (FLEXERIL) 10 MG tablet, Take 2 tablets by mouth at bedtime., Disp: , Rfl:  .  dapagliflozin propanediol (FARXIGA) 10 MG TABS tablet, Take 1 tablet (10 mg total) by mouth daily., Disp: 30 tablet, Rfl: 3 .  digoxin (LANOXIN) 0.125 MG tablet, Take 1 tablet (0.125 mg total) by mouth daily., Disp: 30 tablet, Rfl: 0 .  furosemide (LASIX) 40 MG tablet, Take 1 tablet (40 mg total) by mouth daily. Take 1 tablet as needed for leg swelling or shortness of breath, Disp: 60 tablet, Rfl: 3 .  nicotine (NICODERM CQ - DOSED IN MG/24 HOURS) 21 mg/24hr patch, Place 1 patch (21 mg total) onto the skin daily., Disp: 28 patch, Rfl: 0 .  oxymetazoline (AFRIN) 0.05 % nasal spray, Place 2 sprays into both nostrils 2 (two) times daily as needed for  congestion., Disp: , Rfl:  .  potassium chloride SA (KLOR-CON) 20 MEQ tablet, Take 40 mEq by mouth daily., Disp: , Rfl:  .  spironolactone (ALDACTONE) 25 MG tablet, Take 1 tablet (25 mg total) by mouth daily., Disp: 30 tablet, Rfl: 3 .  venlafaxine (EFFEXOR) 75 MG tablet, Take 75 mg by mouth 3 (three) times daily with meals., Disp: , Rfl:  .  zolpidem (AMBIEN) 10 MG tablet, Take 5 mg by mouth at bedtime., Disp: , Rfl:  .  diclofenac Sodium (VOLTAREN) 1 % GEL, Apply 2 g topically 4 (four) times daily. (Patient not taking: Reported on 02/09/2021), Disp: 100 g, Rfl: 1 .  HYDROcodone-acetaminophen (NORCO/VICODIN) 5-325 MG tablet, Take 1 tablet by mouth every 4 (four) hours as needed for moderate pain. (Patient not taking: Reported on 02/09/2021), Disp: , Rfl:  Allergies  Allergen Reactions  . Sulfa Antibiotics Itching  . Erythromycin Hives  . Sulfamethoxazole-Trimethoprim Hives  . Tramadol Rash and Other (See Comments)    Urinary retention        Social History   Socioeconomic History  . Marital status: Single    Spouse name: Not on file  . Number of children: Not on file  . Years of education: Not on file  . Highest education level: Not on file  Occupational History  . Not on file  Tobacco Use  . Smoking status: Former    Packs/day: 0.50    Types: Cigarettes    Quit date: 01/15/2021    Years since quitting: 0.0  . Smokeless tobacco: Never  Vaping Use  . Vaping Use: Never used  Substance and Sexual Activity  . Alcohol use: Yes    Comment: rarely  . Drug use: Never  . Sexual activity: Not on file  Other Topics Concern  . Not on file  Social History Narrative  . Not on file   Social Determinants of Health   Financial Resource Strain: Medium Risk  . Difficulty of Paying Living Expenses: Somewhat hard  Food Insecurity: No Food Insecurity  . Worried About Charity fundraiser in the Last Year: Never true  . Ran Out of Food in the Last Year: Never true  Transportation  Needs: No Transportation Needs  . Lack of Transportation (Medical): No  . Lack of Transportation (Non-Medical): No  Physical Activity: Not on file  Stress: Not on file  Social Connections: Not on file  Intimate Partner Violence: Not on file    Physical Exam      Future Appointments  Date Time Provider [place]  02/22/2021  1:30 PM [facility] PA/NP [facility] None  03/15/2021  1:00 PM [facility] PHARMACY [facility] None  05/09/2021  1:30 PM Ladell Pier, MD [facility] None    BP 98/68   Pulse 90   Resp 18   Wt 213 lb (96.6 kg)   SpO2 98%   BMI 30.56 kg/m   Weight yesterday-214 Last visit weight-214  First visit with pt in the home, pt has new diagnosis of CHF.  She got diagnosed with CHF when she was in the hosp last time.   Pt lives here with sister.  She is able to drive herself.  She goes to [facility] on [address] previously-now at [employer] due to no insurance.  She is not able to work at this time, no insurance.  She has applied for insurance through [place] health plans.  She is wanting to go back to work-will wait and see how her clinic appoint goes next week.   Pt reports she feels pretty good, she feels like her meds are making her feel sleepy more. Before her diagnosis she had to take sleeping pills b/c she wouldn't sleep much at night.  Now she is sleeping about 12 hrs.   She has periods of short of breath-during that time she is at rest sometimes. She can do house chores without any issues, no trouble with bathing or dressing self.   She has been to [employer] and walked around for an hour and no issues.   She does weigh herself daily-she is aware of why and aware of the 3#/5# gain to contact clinic or myself.   She is using nicotine patches. She feels like she is gaining weight. Her appetite is thru the roof with the stopping of smoking.   She feels like her diet is her main struggle. She is looking up a lot of recipes on internet and  reading books so getting a dietician involved would not be more helpful than what she is doing now.   She has the med assist for eliquis and has the 3 mth supply.  She thinks they have done the [place] med assist as well.  She will stop the asa on 11/13.   She really seems she has everything together.  -will check on the bp  cuff-she said the PA at last visit said they would be in on Sunday-   Pt denies any c/p, no dizziness, she said sometimes she can feel her heart beating but no palpitations or irregularities.  She sleeps in recliner out of comfort from tending to her mother, but could lay flat if needed-she has tried it just to see and she can sleep lying flat if needed.  She has been told that she snores by her sister so I told her that may be something to bring up to see if she needs a sleep study.     Marylouise Stacks, [facility] Paramedic  02/09/21

## 2021-02-09 NOTE — Telephone Encounter (Signed)
Pt aware and agreeable.  

## 2021-02-12 ENCOUNTER — Telehealth: Payer: Self-pay | Admitting: Medical

## 2021-02-12 NOTE — Telephone Encounter (Signed)
   Patient called the after-hours line with concerns for shortness of breath.  She reports recent adjustment of her Lasix.  She has noticed increased shortness of breath since Thursday for which she has taken Lasix 2 times a day.  She did not take a second dose yesterday and woke up feeling short of breath this morning.  She had some orthopnea last night.  Weights have been overall stable, 113 pounds this morning, however is up from her visit with Amy Clegg 01/31/2021.  She would like to know if it is okay to continue taking her Lasix twice a day.  Suspect she would tolerate this okay for a short period but would need close outpatient monitoring to ensure her kidney function and electrolytes remained stable.  Will continue Lasix 40 mg daily with plans for as needed PM dose for weight gain of 3 pounds overnight or 5 pounds in a week or shortness of breath/swelling.  Instructed her to take additional potassium if taking additional Lasix.  She will notify the office if she finds herself taking additional Lasix several days this week so that a BMET can be arranged.  Abigail Butts, PA-C 02/12/21; 3:37 PM

## 2021-02-16 ENCOUNTER — Telehealth (HOSPITAL_COMMUNITY): Payer: Self-pay | Admitting: *Deleted

## 2021-02-16 NOTE — Telephone Encounter (Signed)
Pt called c/o shortness of breath in the middle of the night. Pt says she has to take lasix 49m tid for relief. Pt was advised to only take lasix as prescribed. Current dose should be 425mbid. Pt said that not enough she has to take 4023mn the early AMs or she cant breathe. Pt said she takes her meds about 3 hours apart because she feels like after her first dose within 3 hrs she gets sweaty her heart races and she can't breathe.  Pt said she believes anxiety plays a part but believes its fluid as well.  Per Amy Clegg,NP pt needs to take lasix 4m77mice daily and keep office visit  on 11/9. I spent 21mi78m the phone with patient.

## 2021-02-17 ENCOUNTER — Encounter (HOSPITAL_COMMUNITY): Payer: Self-pay

## 2021-02-17 ENCOUNTER — Other Ambulatory Visit (HOSPITAL_COMMUNITY): Payer: Self-pay

## 2021-02-17 NOTE — Telephone Encounter (Signed)
Pt called back and stated lasix 53m twice daily isnt enough she needs to take lasix 874min the am and 4054mn the pm.  Pt said she is going to run out of medication before her appointment because she has been taking extra medication. Pt said she knows she isnt supposed to take more than 52m52md but she feels short of breath without the additional tablet. Pt asked that we send in new script.   Routed to [facility] advice

## 2021-02-17 NOTE — Telephone Encounter (Signed)
Responded to pt via mychart

## 2021-02-20 ENCOUNTER — Other Ambulatory Visit: Payer: Self-pay

## 2021-02-20 ENCOUNTER — Ambulatory Visit (HOSPITAL_COMMUNITY)
Admission: RE | Admit: 2021-02-20 | Discharge: 2021-02-20 | Disposition: A | Discharge status: Home / Self Care | Payer: 59 | Source: Ambulatory Visit | Attending: Internal Medicine | Admitting: Internal Medicine

## 2021-02-20 DIAGNOSIS — I5042 Chronic combined systolic (congestive) and diastolic (congestive) heart failure: Secondary | ICD-10-CM | POA: Diagnosis present

## 2021-02-20 LAB — BASIC METABOLIC PANEL
Anion gap: 10 (ref 5–15)
BUN: 15 mg/dL (ref 6–20)
CO2: 28 mmol/L (ref 22–32)
Calcium: 9.4 mg/dL (ref 8.9–10.3)
Chloride: 100 mmol/L (ref 98–111)
Creatinine, Ser: 0.96 mg/dL (ref 0.44–1.00)
GFR, Estimated: >60 mL/min (ref >60)
Glucose, Bld: 78 mg/dL (ref 70–99)
Potassium: 4.1 mmol/L (ref 3.5–5.1)
Sodium: 138 mmol/L (ref 135–145)

## 2021-02-21 NOTE — Progress Notes (Signed)
ADVANCED HEART FAILURE CLINIC NOTE   PCP: [facility]: Dr Oval Linsey HF Cardiologist: Dr Haroldine Laws  HPI: Christine [name] is a 55 y.o.female with history of chronic tobacco use, ADHD, anxiety, depression, CAD,  recenlyt diagnosed with HFrEF . Had [place] in July 2022. ? Drug abuse in the past. Followed by psychiatry in the community for anxiety and depression.    Presented to ED 01/20/21 with increased shortness of breath/tachycardia. Adderrall stopped. Echo with EF 20-25%,  LHC/RHC with single vessel CAD elevated filling pressures and  moderately reduced CO. Digoxin added. Beta blocker stopped.  Discharged to home 01/25/21. Discharge weight 213 pounds.   On 01/27/21 she [facility] ED with increased dyspnea. Diuresed with IV lasix and started on lasix 40 mg po daily.      Follow up 01/31/21 carvedilol added, lasix continued 40 mg bid, and referred to Paramedicine.  Today she returns for HF follow up with paramedicine. Had been on reduced lasix dose due to elevated SCr however she noticed she was swelling and more SOB so she increased her dose. No longer SOB walking on flat ground. Has noticed palpitations over the past week. No longer on adderall or consumes caffeine. TSH recently checked and OK. Denies CP, dizziness, edema, or PND/Orthopnea. Appetite ok. No fever or chills. Weight at home 208 pounds. Taking all medications. She lives with her sister and previously worked to [employer], [employer]. Asking when she can return to work.  Cardiac Testing  Echo 01/21/2021 EF 20-25% LV severely dilated, LV apical mural thrombus, RV okay, moderate MR.  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 LVEF 22%  ROS: All systems negative except as listed in HPI, PMH and Problem List.  SH:  Social  History   Socioeconomic History   Marital status: Single    Spouse name: Not on file   Number of children: Not on file   Years of education: Not on file   Highest education level: Not on file  Occupational History   Not on file  Tobacco Use   Smoking status: Former    Packs/day: 0.50    Types: Cigarettes    Quit date: 01/15/2021    Years since quitting: 0.1   Smokeless tobacco: Never  Vaping Use   Vaping Use: Never used  Substance and Sexual Activity   Alcohol use: Yes    Comment: rarely   Drug use: Never   Sexual activity: Not on file  Other Topics Concern   Not on file  Social History Narrative   Not on file   Social Determinants of Health   Financial Resource Strain: Medium Risk   Difficulty of Paying Living Expenses: Somewhat hard  Food Insecurity: No Food Insecurity   Worried About Charity fundraiser in the Last Year: Never true   Ran Out of Food in the Last Year: Never true  Transportation Needs: No Transportation Needs   Lack of Transportation (Medical): No   Lack of Transportation (Non-Medical): No  Physical Activity: Not on file  Stress: Not on file  Social Connections: Not on file  Intimate Partner Violence: Not on file    FH:  Family History  Problem Relation Age of Onset   Stroke Mother    Atrial fibrillation Mother    Heart failure Father  Past Medical History:  Diagnosis Date   Anxiety 01/20/2021   CHF (congestive heart failure) (HCC)    Shortness of breath 01/20/2021   Tobacco abuse 01/20/2021    Current Outpatient Medications  Medication Sig Dispense Refill   alprazolam (XANAX) 2 MG tablet Take 2 mg by mouth in the morning, at noon, and at bedtime.     apixaban (ELIQUIS) 5 MG TABS tablet Take 1 tablet (5 mg total) by mouth 2 (two) times daily. 60 tablet 0   aspirin 81 MG EC tablet Take 1 tablet (81 mg total) by mouth daily. Swallow whole. 30 tablet 11   atorvastatin (LIPITOR) 80 MG tablet Take 1 tablet (80 mg total) by mouth daily.  30 tablet 0   carvedilol (COREG) 3.125 MG tablet Take 1 tablet (3.125 mg total) by mouth 2 (two) times daily with a meal. 60 tablet 2   cyclobenzaprine (FLEXERIL) 10 MG tablet Take 2 tablets by mouth at bedtime.     dapagliflozin propanediol (FARXIGA) 10 MG TABS tablet Take 1 tablet (10 mg total) by mouth daily. 30 tablet 3   digoxin (LANOXIN) 0.125 MG tablet Take 1 tablet (0.125 mg total) by mouth daily. 30 tablet 0   furosemide (LASIX) 40 MG tablet Take 40 mg by mouth 2 (two) times daily.     nicotine (NICODERM CQ - DOSED IN MG/24 HOURS) 21 mg/24hr patch Place 1 patch (21 mg total) onto the skin daily. 28 patch 0   oxymetazoline (AFRIN) 0.05 % nasal spray Place 2 sprays into both nostrils 2 (two) times daily as needed for congestion.     potassium chloride SA (KLOR-CON) 20 MEQ tablet Take 20 mEq by mouth in the morning, at noon, in the evening, and at bedtime.     spironolactone (ALDACTONE) 25 MG tablet Take 1 tablet (25 mg total) by mouth daily. 30 tablet 3   venlafaxine (EFFEXOR) 75 MG tablet Take 75 mg by mouth 3 (three) times daily with meals.     zolpidem (AMBIEN) 10 MG tablet Take 5 mg by mouth at bedtime.     No current facility-administered medications for this encounter.   BP 108/70   Pulse 97   Wt 94.7 kg (208 lb 12.8 oz)   SpO2 97%   BMI 29.96 kg/m   Wt Readings from Last 3 Encounters:  02/22/21 94.7 kg (208 lb 12.8 oz)  02/09/21 96.6 kg (213 lb)  02/06/21 97.2 kg (214 lb 3.2 oz)   PHYSICAL EXAM: General:  NAD. No resp difficulty HEENT: Normal Neck: Supple. No JVD. Carotids 2+ bilat; no bruits. No lymphadenopathy or thryomegaly appreciated. Cor: PMI nondisplaced. Regular rate & rhythm. No rubs, gallops or murmurs. Lungs: Clear Abdomen: Soft, nontender, nondistended. No hepatosplenomegaly. No bruits or masses. Good bowel sounds. Extremities: No cyanosis, clubbing, rash, edema Neuro: Alert & oriented x 3, cranial nerves grossly intact. Moves all 4 extremities w/o  difficulty. Affect pleasant.  ECG: Sinus rhythm 97 bpm (personally reviewed)  ASSESSMENT & PLAN: 1. Chronic HFrEF: -Admitted with new HF on 01/20/21. Echo EF 20-25%, severely dilated LV, RV okay, moderate MR -RHC with elevated filling pressures (PCWP 27 mmHg), moderately reduced cardiac output/index (4.7/2.2) -single vessel CAD with occlusion mid LAD  -cMRI with subendocardial LGE consistent with prior infarcts in LV basal inferolateral wall, apical anterior/septal/inferior walls and apex. No viability. LVEF 22%. RV okay.Not sure how to explain inferior defects on cMRI  - NYHA II. Volume status stable, weight down about 6 lbs.  - Continue lasix  40 mg bid. - Continue coreg 3.125 mg bid. May need to consider ivabradine (see discussion below).  - Continue digoxin 0.125 mg daily. - Continue Farxiga 10 mg daily.  - Continue spiro 25 mg daily - Last dose of adderall 01/19/21. Needs to remain off adderall with reduced EF - Plan to repeat ECHO in 3 months.  - BMET & dig level today.  2. Palpitations - ECG SR today. - TSH ok 10/22. - No longer on Adderall. Does not consume caffeine. Volume OK today. - Place Zio 14 day to quantify. - Labs today. - Consider addition of ivabradine if Zio does not show atrial fibrillation.    3. CAD: - Single vessel LAD occlusion on LHC. - No chest pain. - Continue statin. - Continue aspirin for at least 1 month, stop on 02/26/21   4. Tobacco use: - No longer smoking.   5. LV apical thrombus: - Noted on echo/cMRI.  - On Eliquis. No bleeding issues    6. Mitral valve regurgitation: - Moderate in severity on echo  - Appeared at least moderate on cMRI, but not quantified   Continue Paramedicine, appreciate their assistance. Follow up in 4 weeks with APP and 3 months with Dr. Haroldine Laws + echo.  Rafael Bihari FNP 9:36 PM

## 2021-02-22 ENCOUNTER — Encounter (HOSPITAL_COMMUNITY): Payer: Self-pay

## 2021-02-22 ENCOUNTER — Other Ambulatory Visit: Payer: Self-pay

## 2021-02-22 ENCOUNTER — Other Ambulatory Visit (HOSPITAL_COMMUNITY): Payer: Self-pay

## 2021-02-22 ENCOUNTER — Ambulatory Visit (HOSPITAL_COMMUNITY)
Admission: RE | Admit: 2021-02-22 | Discharge: 2021-02-22 | Disposition: A | Discharge status: Home / Self Care | Payer: 59 | Source: Ambulatory Visit | Attending: Family Medicine | Admitting: Family Medicine

## 2021-02-22 VITALS — BP 108/70 | HR 97 | Wt 208.8 lb

## 2021-02-22 DIAGNOSIS — Z79899 Other long term (current) drug therapy: Secondary | ICD-10-CM | POA: Insufficient documentation

## 2021-02-22 DIAGNOSIS — Z7982 Long term (current) use of aspirin: Secondary | ICD-10-CM | POA: Diagnosis not present

## 2021-02-22 DIAGNOSIS — Z8249 Family history of ischemic heart disease and other diseases of the circulatory system: Secondary | ICD-10-CM | POA: Insufficient documentation

## 2021-02-22 DIAGNOSIS — R002 Palpitations: Secondary | ICD-10-CM | POA: Insufficient documentation

## 2021-02-22 DIAGNOSIS — I2510 Atherosclerotic heart disease of native coronary artery without angina pectoris: Secondary | ICD-10-CM | POA: Insufficient documentation

## 2021-02-22 DIAGNOSIS — Z7984 Long term (current) use of oral hypoglycemic drugs: Secondary | ICD-10-CM | POA: Diagnosis not present

## 2021-02-22 DIAGNOSIS — Z7901 Long term (current) use of anticoagulants: Secondary | ICD-10-CM | POA: Insufficient documentation

## 2021-02-22 DIAGNOSIS — Z720 Tobacco use: Secondary | ICD-10-CM

## 2021-02-22 DIAGNOSIS — I340 Nonrheumatic mitral (valve) insufficiency: Secondary | ICD-10-CM | POA: Diagnosis not present

## 2021-02-22 DIAGNOSIS — I513 Intracardiac thrombosis, not elsewhere classified: Secondary | ICD-10-CM | POA: Insufficient documentation

## 2021-02-22 DIAGNOSIS — Z87891 Personal history of nicotine dependence: Secondary | ICD-10-CM | POA: Diagnosis not present

## 2021-02-22 DIAGNOSIS — I5042 Chronic combined systolic (congestive) and diastolic (congestive) heart failure: Secondary | ICD-10-CM | POA: Diagnosis not present

## 2021-02-22 LAB — BASIC METABOLIC PANEL
Anion gap: 10 (ref 5–15)
BUN: 21 mg/dL — ABNORMAL HIGH (ref 6–20)
CO2: 25 mmol/L (ref 22–32)
Calcium: 9.1 mg/dL (ref 8.9–10.3)
Chloride: 101 mmol/L (ref 98–111)
Creatinine, Ser: 0.81 mg/dL (ref 0.44–1.00)
GFR, Estimated: >60 mL/min (ref >60)
Glucose, Bld: 100 mg/dL — ABNORMAL HIGH (ref 70–99)
Potassium: 3.7 mmol/L (ref 3.5–5.1)
Sodium: 136 mmol/L (ref 135–145)

## 2021-02-22 LAB — DIGOXIN LEVEL: Digoxin Level: 0.4 ng/mL — ABNORMAL LOW (ref 0.8–2.0)

## 2021-02-22 NOTE — Patient Instructions (Signed)
It was great to see you today! No medication changes are needed at this time.  Labs today We will only contact you if something comes back abnormal or we need to make some changes. Otherwise no news is good news!  Your provider has recommended that  you wear a Zio Patch for 14 days.  This monitor will record your heart rhythm for our review.  IF you have any symptoms while wearing the monitor please press the button.  If you have any issues with the patch or you notice a red or orange light on it please call the company at [phone].  Once you remove the patch please mail it back to the company as soon as possible so we can get the results.  Your physician recommends that you schedule a follow-up appointment in: 3-4 weeks  in the Advanced Practitioners (PA/NP) Clinic and in 2-3 months with Dr Haroldine Laws and echo  Your physician has requested that you have an echocardiogram. Echocardiography is a painless test that uses sound waves to create images of your heart. It provides your doctor with information about the size and shape of your heart and how well your heart's chambers and valves are working. This procedure takes approximately one hour. There are no restrictions for this procedure.  Do the following things EVERYDAY: Weigh yourself in the morning before breakfast. Write it down and keep it in a log. Take your medicines as prescribed Eat low salt foods--Limit salt (sodium) to 2000 mg per day.  Stay as active as you can everyday Limit all fluids for the day to less than 2 liters  At the [facility], you and your health needs are our priority. As part of our continuing mission to provide you with exceptional heart care, we have created designated Provider Care Teams. These Care Teams include your primary Cardiologist (physician) and Advanced Practice Providers (APPs- Physician Assistants and Nurse Practitioners) who all work together to provide you with the care you need,  when you need it.   You may see any of the following providers on your designated Care Team at your next follow up: Dr Glori Bickers Dr Haynes Kerns, NP Lyda Jester, [place] [facility] [place], [place] Audry Riles, PharmD   Please be sure to bring in all your medications bottles to every appointment.   If you have any questions or concerns before your next appointment please send [place] a message through [place] or call our office at [phone].    TO LEAVE A MESSAGE FOR THE NURSE SELECT OPTION 2, PLEASE LEAVE A MESSAGE INCLUDING: YOUR NAME DATE OF BIRTH CALL BACK NUMBER REASON FOR CALL**this is important as we prioritize the call backs  YOU WILL RECEIVE A CALL BACK THE SAME DAY AS LONG AS YOU CALL BEFORE 4:00 PM

## 2021-02-22 NOTE — Progress Notes (Signed)
Paramedicine Encounter   Patient ID: Christine [name] , female,   DOB: [birth year],55 y.o.,  MRN: [id number]   Met patient in clinic today with provider.  Weight @ clinic-208 B/p-108/70 P-97 Sp02-97   She reports feeling good. Her REDS clip the other day was 34%.  She is feeling palpitations at this time.  EKG ordered. She has had palpitations happen several times since Friday -5 days.   She has a few days left of the farxiga, she thinks the med assist was done for that. She was approved for the eliquis and already has the supply of that.  She will let us know if there is a super high co-pay for that when she tries to fill it.  Asked jenna to see if she can find out for sure if the farxiga med assist was completed.   She is taking 12m of lasix daily.   Zio patch will be placed.  Labs done today to check dig level.  Will f/u next week.   ---with her insurance she will need a co-pay, not a med assist application-so I got that from clinic and took it to [facility]-they tried running it but with the insurance it shows a $15 co-pay and the farxiga card would not work-they are going to call and see if that is accurate-I called pt to let her know and aware, she advised the $15 is doable, that wont be a problem-so worst case she will pay $15, best case $0.  She will p/u tomor.     [name] [place]11/12/2020

## 2021-02-27 ENCOUNTER — Other Ambulatory Visit: Payer: Self-pay | Admitting: Internal Medicine

## 2021-02-27 ENCOUNTER — Other Ambulatory Visit (HOSPITAL_COMMUNITY): Payer: Self-pay | Admitting: Adult Health

## 2021-02-27 ENCOUNTER — Other Ambulatory Visit (HOSPITAL_COMMUNITY): Payer: Self-pay

## 2021-02-28 ENCOUNTER — Other Ambulatory Visit (HOSPITAL_COMMUNITY): Payer: Self-pay

## 2021-02-28 MED FILL — Atorvastatin Calcium Tab 80 MG (Base Equivalent): ORAL | 30 days supply | Qty: 30 | Fill #0 | Status: AC

## 2021-02-28 MED FILL — Digoxin Tab 125 MCG (0.125 MG): ORAL | 30 days supply | Qty: 30 | Fill #0 | Status: AC

## 2021-03-02 ENCOUNTER — Telehealth (HOSPITAL_COMMUNITY): Payer: Self-pay

## 2021-03-02 ENCOUNTER — Encounter (HOSPITAL_COMMUNITY): Payer: Self-pay

## 2021-03-02 ENCOUNTER — Other Ambulatory Visit (HOSPITAL_COMMUNITY): Payer: Self-pay

## 2021-03-02 ENCOUNTER — Other Ambulatory Visit (HOSPITAL_COMMUNITY): Payer: Self-pay | Admitting: Adult Health

## 2021-03-02 NOTE — Progress Notes (Signed)
Paramedicine Encounter    Patient ID: Christine [name], female    DOB: 1965/06/03, 55 y.o.   MRN: [id number]   Patient Care Team: Pcp, No as PCP - General Skeet Latch, MD as PCP - Cardiology (Cardiology)  Patient Active Problem List   Diagnosis Date Noted   Former smoker 02/06/2021   Left ventricular apical thrombus 02/06/2021   Obesity (BMI 30.0-34.9) 02/06/2021   CHF (congestive heart failure) ([place]) 01/20/2021   Anxiety 01/20/2021   Tobacco abuse 01/20/2021   Shortness of breath 01/20/2021   Chest tightness     Current Outpatient Medications:    alprazolam (XANAX) 2 MG tablet, Take 2 mg by mouth in the morning, at noon, and at bedtime., Disp: , Rfl:    apixaban (ELIQUIS) 5 MG TABS tablet, Take 1 tablet (5 mg total) by mouth 2 (two) times daily., Disp: 60 tablet, Rfl: 0   aspirin 81 MG EC tablet, Take 1 tablet (81 mg total) by mouth daily. Swallow whole., Disp: 30 tablet, Rfl: 11   atorvastatin (LIPITOR) 80 MG tablet, Take 1 tablet (80 mg total) by mouth daily., Disp: 30 tablet, Rfl: 3   carvedilol (COREG) 3.125 MG tablet, Take 1 tablet (3.125 mg total) by mouth 2 (two) times daily with a meal., Disp: 60 tablet, Rfl: 2   cyclobenzaprine (FLEXERIL) 10 MG tablet, Take 2 tablets by mouth at bedtime., Disp: , Rfl:    dapagliflozin propanediol (FARXIGA) 10 MG TABS tablet, Take 1 tablet (10 mg total) by mouth daily., Disp: 30 tablet, Rfl: 3   digoxin (LANOXIN) 0.125 MG tablet, Take 1 tablet (0.125 mg total) by mouth daily., Disp: 30 tablet, Rfl: 3   nicotine (NICODERM CQ - DOSED IN MG/24 HOURS) 21 mg/24hr patch, Place 1 patch (21 mg total) onto the skin daily., Disp: 28 patch, Rfl: 0   oxymetazoline (AFRIN) 0.05 % nasal spray, Place 2 sprays into both nostrils 2 (two) times daily as needed for congestion., Disp: , Rfl:    potassium chloride SA (KLOR-CON) 20 MEQ tablet, Take 20 mEq by mouth in the morning, at noon, in the evening, and at bedtime., Disp: , Rfl:    spironolactone  (ALDACTONE) 25 MG tablet, Take 1 tablet (25 mg total) by mouth daily., Disp: 30 tablet, Rfl: 3   venlafaxine (EFFEXOR) 75 MG tablet, Take 75 mg by mouth 3 (three) times daily with meals., Disp: , Rfl:    zolpidem (AMBIEN) 10 MG tablet, Take 5 mg by mouth at bedtime., Disp: , Rfl:    furosemide (LASIX) 40 MG tablet, Take 1 tablet (40 mg total) by mouth 2 (two) times daily., Disp: 60 tablet, Rfl: 3 Allergies  Allergen Reactions   Sulfa Antibiotics Itching   Erythromycin Hives   Sulfamethoxazole-Trimethoprim Hives   Tramadol Rash and Other (See Comments)    Urinary retention        Social History   Socioeconomic History   Marital status: Single    Spouse name: Not on file   Number of children: Not on file   Years of education: Not on file   Highest education level: Not on file  Occupational History   Not on file  Tobacco Use   Smoking status: Former    Packs/day: 0.50    Types: Cigarettes    Quit date: 01/15/2021    Years since quitting: 0.1   Smokeless tobacco: Never  Vaping Use   Vaping Use: Never used  Substance and Sexual Activity   Alcohol use: Yes  Comment: rarely   Drug use: Never   Sexual activity: Not on file  Other Topics Concern   Not on file  Social History Narrative   Not on file   Social Determinants of Health   Financial Resource Strain: Medium Risk   Difficulty of Paying Living Expenses: Somewhat hard  Food Insecurity: No Food Insecurity   Worried About Running Out of Food in the Last Year: Never true   Ran Out of Food in the Last Year: Never true  Transportation Needs: No Transportation Needs   Lack of Transportation (Medical): No   Lack of Transportation (Non-Medical): No  Physical Activity: Not on file  Stress: Not on file  Social Connections: Not on file  Intimate Partner Violence: Not on file    Physical Exam      Future Appointments  Date Time Provider [place]  03/17/2021  2:30 PM [facility] PA/NP [facility] None  03/23/2021   1:15 PM [facility]ARDI[facility] PHASE II ORIENT [facility] None  03/27/2021  1:00 PM [facility] PHASE II EXC [facility] None  03/29/2021  1:00 PM [facility] PHASE II EXC [facility] None  03/31/2021  1:00 PM [facility] PHASE II EXC [facility] None  04/03/2021  1:00 PM [facility] PHASE II EXC [facility] None  04/05/2021  1:00 PM [facility] PHASE II EXC [facility] None  04/07/2021  1:00 PM [facility] PHASE II EXC [facility] None  04/12/2021  1:00 PM [facility] PHASE II EXC [facility] None  04/14/2021  1:00 PM [facility] PHASE II EXC [facility] None  04/19/2021  1:00 PM [facility] PHASE II EXC [facility] None  04/21/2021  1:00 PM [facility] PHASE II EXC [facility] None  04/24/2021  1:00 PM [facility] PHASE II EXC [facility] None  04/26/2021  1:00 PM [facility] PHASE II EXC [facility] None  04/28/2021  1:00 PM [facility] PHASE II EXC [facility] None  05/01/2021  1:00 PM [facility] PHASE II EXC [facility] None  05/03/2021  1:00 PM [facility] PHASE II EXC [facility] None  05/05/2021  1:00 PM [facility] PHASE II EXC [facility] None  05/08/2021  1:00 PM [facility] PHASE II EXC [facility] None  05/09/2021  1:30 PM Ladell Pier, MD [facility] None  05/10/2021  1:00 PM [facility] PHASE II EXC [facility] None  05/11/2021  1:00 PM [place] ECHO OP 1 [facility]  05/11/2021  2:20 PM Bensimhon, Shaune Pascal, MD [facility] None  05/12/2021  1:00 PM [facility] PHASE II EXC [facility] None  05/15/2021  1:00 PM [facility] PHASE II EXC [facility] None  05/17/2021  1:00 PM [facility] PHASE II EXC [facility] None  05/19/2021  1:00 PM [facility] PHASE II EXC [facility] None    BP 100/70   Pulse (!) 110   Resp 18   Wt 207 lb (93.9 kg)   SpO2 98%   BMI 29.70 kg/m   Weight yesterday-204 Last visit weight-208  Pt reports she is doing ok. She reports last week her weight got up to 211-212 so she had to take a 3rd lasix in a day once, after that she felt much better.   She still has zio patch. She has felt the palpitations some, she reports it is mostly in the mornings that lasts off and on for about 3 hrs.   She has good energy,  able to walk up and down steps without any issues.   Her weight is up 3lbs from yesterday, she did feel more sob this morning. The palpitations are more pronounced during this time as well. She is due to take her afternoon dose of lasix now.  Her lasix bottle is listed as 63m daily and only 30 tablets but listed in Epic she needs 444mBID. I called pharmacy and they will send note to clinic to get new rx.  Also called in her carvedilol as well.    KaMarylouise StacksEMClear [facility]Community [facility]  03/06/21

## 2021-03-02 NOTE — Telephone Encounter (Signed)
Attempted to call patient in regards to Cardiac Rehab - LM on VM Mailed letter 

## 2021-03-03 ENCOUNTER — Other Ambulatory Visit (HOSPITAL_COMMUNITY): Payer: Self-pay

## 2021-03-03 ENCOUNTER — Telehealth (HOSPITAL_COMMUNITY): Payer: Self-pay

## 2021-03-03 ENCOUNTER — Other Ambulatory Visit (HOSPITAL_COMMUNITY): Payer: Self-pay | Admitting: *Deleted

## 2021-03-03 MED ORDER — FUROSEMIDE 40 MG PO TABS
40.0000 mg | ORAL_TABLET | Freq: Two times a day (BID) | ORAL | 3 refills | Status: DC
  Filled 2021-03-03 (×2): qty 60, 30d supply, fill #0
  Filled 2021-03-14: qty 60, 30d supply, fill #1
  Filled 2021-04-04 – 2021-04-06 (×2): qty 60, 30d supply, fill #2

## 2021-03-03 NOTE — Telephone Encounter (Signed)
Called patient to see if she was interested in participating in the Cardiac Rehab Program. Patient stated yes. Patient will come in for orientation on 03/23/21 @ 1:15PM and will attend the 1PM exercise class.   Tourist information centre manager.

## 2021-03-03 NOTE — Telephone Encounter (Signed)
Pt insurance is active and benefits verified through Friday Plan. Co-pay $0.00, DED $0.00/$0.00 met, out of pocket $2,900.00/$21.56 met, co-insurance 10%. No pre-authorization required. Jordan/Friday, 03/03/21, TYV#[id number]

## 2021-03-06 ENCOUNTER — Encounter (HOSPITAL_COMMUNITY): Payer: Self-pay

## 2021-03-08 ENCOUNTER — Telehealth (HOSPITAL_COMMUNITY): Payer: Self-pay

## 2021-03-08 NOTE — Telephone Encounter (Signed)
Contacted pt to f/u on how she is feeling after calling in to the clinic reporting weight gain.    Yesterday weight was 213.  This morning her weight is 211.   She is weighing morning and evening time-but evening weight is associated with sob and she can feel the weight come on her.    Her home b/p readings- Some readings have been slightly elevated for her-110/systolic.  Her normal readings is around 90-100.    She had no issues getting meds.   She did get in contact with cardiac rehab and plans to go.   Marylouise Stacks, EMT-Paramedic  03/08/21

## 2021-03-13 NOTE — Addendum Note (Signed)
Encounter addended by: Micki Riley, RN on: 03/13/2021 3:01 PM  Actions taken: Imaging Exam begun

## 2021-03-13 NOTE — Addendum Note (Signed)
Encounter addended by: Micki Riley, RN on: 03/13/2021 3:01 PM  Actions taken: Imaging Exam ended

## 2021-03-14 ENCOUNTER — Other Ambulatory Visit (HOSPITAL_COMMUNITY): Payer: Self-pay

## 2021-03-15 ENCOUNTER — Other Ambulatory Visit (HOSPITAL_COMMUNITY): Payer: Self-pay

## 2021-03-15 ENCOUNTER — Other Ambulatory Visit (HOSPITAL_COMMUNITY): Payer: Self-pay | Admitting: *Deleted

## 2021-03-15 MED ORDER — POTASSIUM CHLORIDE CRYS ER 20 MEQ PO TBCR
40.0000 meq | EXTENDED_RELEASE_TABLET | Freq: Two times a day (BID) | ORAL | 3 refills | Status: DC
  Filled 2021-03-16: qty 120, 30d supply, fill #0
  Filled 2021-04-18: qty 120, 30d supply, fill #1
  Filled 2021-05-23: qty 120, 30d supply, fill #2
  Filled 2021-06-21: qty 120, 30d supply, fill #3

## 2021-03-16 ENCOUNTER — Other Ambulatory Visit (HOSPITAL_COMMUNITY): Payer: Self-pay

## 2021-03-17 ENCOUNTER — Encounter (HOSPITAL_COMMUNITY): Payer: Self-pay

## 2021-03-17 ENCOUNTER — Other Ambulatory Visit (HOSPITAL_COMMUNITY): Payer: Self-pay

## 2021-03-17 ENCOUNTER — Ambulatory Visit (HOSPITAL_COMMUNITY)
Admission: RE | Admit: 2021-03-17 | Discharge: 2021-03-17 | Disposition: A | Discharge status: Home / Self Care | Payer: 59 | Source: Ambulatory Visit | Attending: Cardiology | Admitting: Cardiology

## 2021-03-17 VITALS — BP 92/56 | HR 105 | Wt 208.4 lb

## 2021-03-17 DIAGNOSIS — I5022 Chronic systolic (congestive) heart failure: Secondary | ICD-10-CM | POA: Diagnosis not present

## 2021-03-17 DIAGNOSIS — Z79899 Other long term (current) drug therapy: Secondary | ICD-10-CM | POA: Insufficient documentation

## 2021-03-17 DIAGNOSIS — I5042 Chronic combined systolic (congestive) and diastolic (congestive) heart failure: Secondary | ICD-10-CM

## 2021-03-17 DIAGNOSIS — I340 Nonrheumatic mitral (valve) insufficiency: Secondary | ICD-10-CM

## 2021-03-17 DIAGNOSIS — R002 Palpitations: Secondary | ICD-10-CM | POA: Diagnosis not present

## 2021-03-17 DIAGNOSIS — Z7901 Long term (current) use of anticoagulants: Secondary | ICD-10-CM | POA: Diagnosis not present

## 2021-03-17 DIAGNOSIS — Z87891 Personal history of nicotine dependence: Secondary | ICD-10-CM | POA: Diagnosis not present

## 2021-03-17 DIAGNOSIS — I513 Intracardiac thrombosis, not elsewhere classified: Secondary | ICD-10-CM

## 2021-03-17 DIAGNOSIS — I2510 Atherosclerotic heart disease of native coronary artery without angina pectoris: Secondary | ICD-10-CM

## 2021-03-17 DIAGNOSIS — Z720 Tobacco use: Secondary | ICD-10-CM | POA: Diagnosis not present

## 2021-03-17 LAB — BASIC METABOLIC PANEL
Anion gap: 9 (ref 5–15)
BUN: 18 mg/dL (ref 6–20)
CO2: 27 mmol/L (ref 22–32)
Calcium: 9.5 mg/dL (ref 8.9–10.3)
Chloride: 101 mmol/L (ref 98–111)
Creatinine, Ser: 0.88 mg/dL (ref 0.44–1.00)
GFR, Estimated: >60 mL/min (ref >60)
Glucose, Bld: 131 mg/dL — ABNORMAL HIGH (ref 70–99)
Potassium: 3.5 mmol/L (ref 3.5–5.1)
Sodium: 137 mmol/L (ref 135–145)

## 2021-03-17 MED ORDER — METOPROLOL SUCCINATE ER 25 MG PO TB24
12.5000 mg | ORAL_TABLET | Freq: Every day | ORAL | 3 refills | Status: DC
  Filled 2021-03-17: qty 45, 90d supply, fill #0

## 2021-03-17 NOTE — Progress Notes (Signed)
ADVANCED HEART FAILURE CLINIC NOTE   PCP: [place] [facility]: Dr Oval Linsey HF Cardiologist: Dr Haroldine Laws  HPI: Christine [name] is a 55 y.o.female with history of chronic tobacco use, ADHD, anxiety, depression, CAD,  recenlyt diagnosed with HFrEF.   Presented to ED 01/20/21 with increased shortness of breath/tachycardia. Adderrall stopped. Echo with EF 20-25%,  LHC/RHC with single vessel CAD elevated filling pressures and  moderately reduced CO. Digoxin added. Beta blocker stopped.  Discharged to home 01/25/21. Discharge weight 213 pounds.   Returned to [facility] ED 01/27/21 with increased dyspnea. Diuresed with IV lasix and started on lasix 40 mg po daily.      Follow up 01/31/21 carvedilol added and referred to Paramedicine.  Weight was up 3.5 lbs and she was instructed to increase torsemide to 60 mg bid.  Today she returns for HF follow up. Overall feeling fine. Breathing is better, no longer having PND. Able to walk on flat ground OK without difficulty. Denies CP, dizziness, edema, or PND/Orthopnea. Appetite ok. No fever or chills. Weight at home 204-207 pounds, becomes symptomatic ~208 lbs. Taking all medications. She lives with her sister and previously worked to Engineer, mining. Asking when she can return to work.  Cardiac Testing  - Echo 01/21/2021 EF 20-25% LV severely dilated, LV apical mural thrombus, RV okay, moderate MR.  - R/LHC w/ severe 1v CAD with occlusion of mid LAD., PCW 27, CO 4.7, CI 2.2    - cMRI demonstrated subendocardial LGE consistent with prior infarcts in LV basal inferolateral wall, apical anterior/septal/inferior walls and apex. LVEF 22%  ROS: All systems negative except as listed in HPI, PMH and Problem List.  SH:  Social History   Socioeconomic History   Marital status: Single    Spouse name: Not on file   Number of children: Not on file   Years of education: Not on file   Highest education level: Not on file  Occupational History    Not on file  Tobacco Use   Smoking status: Former    Packs/day: 0.50    Types: Cigarettes    Quit date: 01/15/2021    Years since quitting: 0.1   Smokeless tobacco: Never  Vaping Use   Vaping Use: Never used  Substance and Sexual Activity   Alcohol use: Yes    Comment: rarely   Drug use: Never   Sexual activity: Not on file  Other Topics Concern   Not on file  Social History Narrative   Not on file   Social Determinants of Health   Financial Resource Strain: Medium Risk   Difficulty of Paying Living Expenses: Somewhat hard  Food Insecurity: No Food Insecurity   Worried About Charity fundraiser in the Last Year: Never true   Ran Out of Food in the Last Year: Never true  Transportation Needs: No Transportation Needs   Lack of Transportation (Medical): No   Lack of Transportation (Non-Medical): No  Physical Activity: Not on file  Stress: Not on file  Social Connections: Not on file  Intimate Partner Violence: Not on file   FH:  Family History  Problem Relation Age of Onset   Stroke Mother    Atrial fibrillation Mother    Heart failure Father    Past Medical History:  Diagnosis Date   Anxiety 01/20/2021   CHF (congestive heart failure) (H[facility])    Shortness of breath 01/20/2021   Tobacco abuse 01/20/2021   Current Outpatient Medications  Medication Sig Dispense Refill  acetaminophen (TYLENOL) 500 MG tablet Take 500-1,000 mg by mouth every 6 (six) hours as needed.     ALPRAZolam (XANAX) 1 MG tablet Take 2 mg by mouth 3 (three) times daily.     apixaban (ELIQUIS) 5 MG TABS tablet Take 1 tablet (5 mg total) by mouth 2 (two) times daily. 60 tablet 0   atorvastatin (LIPITOR) 80 MG tablet Take 1 tablet (80 mg total) by mouth daily. 30 tablet 3   carvedilol (COREG) 3.125 MG tablet Take 1 tablet (3.125 mg total) by mouth 2 (two) times daily with a meal. 60 tablet 2   cyclobenzaprine (FLEXERIL) 10 MG tablet Take 20 mg by mouth at bedtime.     dapagliflozin propanediol  (FARXIGA) 10 MG TABS tablet Take 1 tablet (10 mg total) by mouth daily. 30 tablet 3   digoxin (LANOXIN) 0.125 MG tablet Take 1 tablet (0.125 mg total) by mouth daily. 30 tablet 3   furosemide (LASIX) 40 MG tablet Take 40 mg by mouth. 80 mg in the AM and 40 mg in the PM     guaifenesin (HUMIBID E) 400 MG TABS tablet Take 400-800 mg by mouth 3 (three) times daily as needed (congestion).     nicotine (NICODERM CQ - DOSED IN MG/24 HOURS) 21 mg/24hr patch Place 1 patch (21 mg total) onto the skin daily. 28 patch 0   oxymetazoline (AFRIN) 0.05 % nasal spray Place 2 sprays into both nostrils 2 (two) times daily as needed for congestion.     potassium chloride SA (KLOR-CON M) 20 MEQ tablet Take 2 tablets (40 mEq total) by mouth 2 (two) times daily. (Patient taking differently: Take 20 mEq by mouth in the morning, at noon, in the evening, and at bedtime.) 120 tablet 3   spironolactone (ALDACTONE) 25 MG tablet Take 1 tablet (25 mg total) by mouth daily. 30 tablet 3   venlafaxine XR (EFFEXOR-XR) 75 MG 24 hr capsule Take 75 mg by mouth in the morning, at noon, and at bedtime.     zolpidem (AMBIEN) 10 MG tablet Take 5-10 mg by mouth at bedtime.     No current facility-administered medications for this encounter.   BP (!) 92/56   Pulse (!) 105   Wt 94.5 kg (208 lb 6.4 oz)   SpO2 95%   BMI 29.90 kg/m   Wt Readings from Last 3 Encounters:  03/17/21 94.5 kg (208 lb 6.4 oz)  03/02/21 93.9 kg (207 lb)  02/22/21 94.7 kg (208 lb 12.8 oz)   PHYSICAL EXAM: General:  NAD. No resp difficulty HEENT: Normal Neck: Supple. No JVD. Carotids 2+ bilat; no bruits. No lymphadenopathy or thryomegaly appreciated. Cor: PMI nondisplaced. Regular rate & rhythm. No rubs, gallops or murmurs. Lungs: Clear Abdomen: Soft, nontender, nondistended. No hepatosplenomegaly. No bruits or masses. Good bowel sounds. Extremities: No cyanosis, clubbing, rash, edema Neuro: Alert & oriented x 3, cranial nerves grossly intact. Moves all 4  extremities w/o difficulty. Affect pleasant.  ASSESSMENT & PLAN: 1. Chronic HFrEF: - Admitted with new HF on 01/20/21. 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 No viability. LVEF 22%. RV okay. Not sure how to explain inferior defects on cMRI  - Stable NYHA II. Volume status stable, ReDs 34%. -  With borderline low BP and HR 105, stop carvedilol and start Toprol XL 12.5 mg daily. - Continue lasix 60 mg bid. - Continue digoxin 0.125 mg daily. Last 0.4 on 02/22/21 - Continue Farxiga 10 mg daily.  - Continue spiro 25 mg daily. - Needs to remain off adderall with reduced EF. - Plan to repeat ECHO in 2 months.  - BMET today.  2. Palpitations - Resolved. - No longer on Adderall. Does not consume caffeine.  - Zio 14-day (11/22) showed mostly SR, no high-grade arrhythmias. - Switch to Toprol as above.   3. CAD: - Single vessel LAD occlusion on LHC. - No chest pain. - Continue statin. - No ASA with Eliquis.   4. Tobacco use: - No longer smoking.   5. LV apical thrombus: - Noted on echo/cMRI.  - On Eliquis. No bleeding issues.    6. Mitral valve regurgitation: - Moderate in severity on echo  - Appeared at least moderate on cMRI, but not quantified.   Continue Paramedicine, appreciate their assistance. We discussed RTW today. She does Nurse, learning disability. I think she can return to work on a part time basis, no heavy lifting, with allowances for frequent rest breaks and/or days off as needed. She declined a note for work today.  Follow up in 2 months with Dr. Haroldine Laws + echo as scheduled.  Rafael Bihari FNP 2:46 PM

## 2021-03-17 NOTE — Patient Instructions (Signed)
STOP Coreg START Toprol XL 12.5 mg, one tab daily  Labs today We will only contact you if something comes back abnormal or we need to make some changes. Otherwise no news is good news!  Keep follow and echo as scheduled  Do the following things EVERYDAY: Weigh yourself in the morning before breakfast. Write it down and keep it in a log. Take your medicines as prescribed Eat low salt foods--Limit salt (sodium) to 2000 mg per day.  Stay as active as you can everyday Limit all fluids for the day to less than 2 liters  At the [facility], you and your health needs are our priority. As part of our continuing mission to provide you with exceptional heart care, we have created designated Provider Care Teams. These Care Teams include your primary Cardiologist (physician) and Advanced Practice Providers (APPs- Physician Assistants and Nurse Practitioners) who all work together to provide you with the care you need, when you need it.   You may see any of the following providers on your designated Care Team at your next follow up: Dr Glori Bickers Dr Haynes Kerns, NP Lyda Jester, [place] [facility] [place], [place] Audry Riles, PharmD   Please be sure to bring in all your medications bottles to every appointment.   If you have any questions or concerns before your next appointment please send [place] a message through [place] or call our office at [phone].    TO LEAVE A MESSAGE FOR THE NURSE SELECT OPTION 2, PLEASE LEAVE A MESSAGE INCLUDING: YOUR NAME DATE OF BIRTH CALL BACK NUMBER REASON FOR CALL**this is important as we prioritize the call backs  YOU WILL RECEIVE A CALL BACK THE SAME DAY AS LONG AS YOU CALL BEFORE 4:00 PM

## 2021-03-17 NOTE — Progress Notes (Signed)
ReDS Vest / Clip - 03/17/21 1500       ReDS Vest / Clip   Station Marker D    Ruler Value 26.5    ReDS Value Range Low volume    ReDS Actual Value 34

## 2021-03-21 ENCOUNTER — Telehealth (HOSPITAL_COMMUNITY): Payer: Self-pay

## 2021-03-21 NOTE — Telephone Encounter (Signed)
Successful telephone encounter to patient to confirm cardiac rehab orientation appointment Thursday, 03/23/21 at 1:15 pm. Unfortunately patient does not have time to complete telephone health history. Patient is provided cardiac rehab contact at [phone] and encouraged to call back prior to appointment to complete health assessment. Patient agreeable.

## 2021-03-22 ENCOUNTER — Telehealth (HOSPITAL_COMMUNITY): Payer: Self-pay | Admitting: *Deleted

## 2021-03-22 ENCOUNTER — Other Ambulatory Visit (HOSPITAL_COMMUNITY): Payer: Self-pay

## 2021-03-22 NOTE — Progress Notes (Signed)
Paramedicine Encounter    Patient ID: Christine [name], female    DOB: Feb 14, 1966, 55 y.o.   MRN: [id number]   Patient Care Team: Pcp, No as PCP - General Skeet Latch, MD as PCP - Cardiology (Cardiology)  Patient Active Problem List   Diagnosis Date Noted   Former smoker 02/06/2021   Left ventricular apical thrombus 02/06/2021   Obesity (BMI 30.0-34.9) 02/06/2021   CHF (congestive heart failure) ([place]) 01/20/2021   Anxiety 01/20/2021   Tobacco abuse 01/20/2021   Shortness of breath 01/20/2021   Chest tightness     Current Outpatient Medications:    acetaminophen (TYLENOL) 500 MG tablet, Take 500-1,000 mg by mouth every 6 (six) hours as needed., Disp: , Rfl:    ALPRAZolam (XANAX) 1 MG tablet, Take 2 mg by mouth 3 (three) times daily., Disp: , Rfl:    apixaban (ELIQUIS) 5 MG TABS tablet, Take 1 tablet (5 mg total) by mouth 2 (two) times daily., Disp: 60 tablet, Rfl: 0   atorvastatin (LIPITOR) 80 MG tablet, Take 1 tablet (80 mg total) by mouth daily., Disp: 30 tablet, Rfl: 3   cyclobenzaprine (FLEXERIL) 10 MG tablet, Take 20 mg by mouth at bedtime., Disp: , Rfl:    dapagliflozin propanediol (FARXIGA) 10 MG TABS tablet, Take 1 tablet (10 mg total) by mouth daily., Disp: 30 tablet, Rfl: 3   digoxin (LANOXIN) 0.125 MG tablet, Take 1 tablet (0.125 mg total) by mouth daily., Disp: 30 tablet, Rfl: 3   furosemide (LASIX) 40 MG tablet, Take 40 mg by mouth. 80 mg in the AM and 40 mg in the PM, Disp: , Rfl:    metoprolol succinate (TOPROL-XL) 25 MG 24 hr tablet, Take 0.5 tablets (12.5 mg total) by mouth daily. Take with or immediately following a meal., Disp: 45 tablet, Rfl: 3   nicotine (NICODERM CQ - DOSED IN MG/24 HOURS) 21 mg/24hr patch, Place 1 patch (21 mg total) onto the skin daily., Disp: 28 patch, Rfl: 0   oxymetazoline (AFRIN) 0.05 % nasal spray, Place 2 sprays into both nostrils 2 (two) times daily as needed for congestion., Disp: , Rfl:    potassium chloride SA (KLOR-CON M) 20 MEQ  tablet, Take 2 tablets (40 mEq total) by mouth 2 (two) times daily. (Patient taking differently: Take 20 mEq by mouth in the morning, at noon, in the evening, and at bedtime.), Disp: 120 tablet, Rfl: 3   spironolactone (ALDACTONE) 25 MG tablet, Take 1 tablet (25 mg total) by mouth daily., Disp: 30 tablet, Rfl: 3   venlafaxine XR (EFFEXOR-XR) 75 MG 24 hr capsule, Take 75 mg by mouth in the morning, at noon, and at bedtime., Disp: , Rfl:    zolpidem (AMBIEN) 10 MG tablet, Take 5-10 mg by mouth at bedtime., Disp: , Rfl:    guaifenesin (HUMIBID E) 400 MG TABS tablet, Take 400-800 mg by mouth 3 (three) times daily as needed (congestion). (Patient not taking: Reported on 03/22/2021), Disp: , Rfl:  Allergies  Allergen Reactions   Sulfa Antibiotics Itching   Erythromycin Hives   Sulfamethoxazole-Trimethoprim Hives   Tramadol Rash and Other (See Comments)    Urinary retention     Tape Rash      Social History   Socioeconomic History   Marital status: Single    Spouse name: Not on file   Number of children: Not on file   Years of education: Not on file   Highest education level: Not on file  Occupational History   Not on  file  Tobacco Use   Smoking status: Former    Packs/day: 0.50    Types: Cigarettes    Quit date: 01/15/2021    Years since quitting: 0.1   Smokeless tobacco: Never  Vaping Use   Vaping Use: Never used  Substance and Sexual Activity   Alcohol use: Yes    Comment: rarely   Drug use: Never   Sexual activity: Not on file  Other Topics Concern   Not on file  Social History Narrative   Not on file   Social Determinants of Health   Financial Resource Strain: Medium Risk   Difficulty of Paying Living Expenses: Somewhat hard  Food Insecurity: No Food Insecurity   Worried About Charity fundraiser in the Last Year: Never true   Ran Out of Food in the Last Year: Never true  Transportation Needs: No Transportation Needs   Lack of Transportation (Medical): No   Lack  of Transportation (Non-Medical): No  Physical Activity: Not on file  Stress: Not on file  Social Connections: Not on file  Intimate Partner Violence: Not on file    Physical Exam      Future Appointments  Date Time Provider [place]  03/23/2021  1:15 PM [facility] PHASE II ORIENT [facility] None  03/27/2021  1:00 PM [facility] PHASE II EXC [facility] None  03/29/2021  1:00 PM [facility] PHASE II EXC [facility] None  03/31/2021  1:00 PM [facility] PHASE II EXC [facility] None  04/03/2021  1:00 PM [facility] PHASE II EXC [facility] None  04/05/2021  1:00 PM [facility] PHASE II EXC [facility] None  04/07/2021  1:00 PM [facility] PHASE II EXC [facility] None  04/12/2021  1:00 PM [facility] PHASE II EXC [facility] None  04/14/2021  1:00 PM [facility] PHASE II EXC [facility] None  04/19/2021  1:00 PM [facility] PHASE II EXC [facility] None  04/21/2021  1:00 PM [facility] PHASE II EXC [facility] None  04/24/2021  1:00 PM [facility] PHASE II EXC [facility] None  04/26/2021  1:00 PM [facility] PHASE II EXC [facility] None  04/28/2021  1:00 PM [facility] PHASE II EXC [facility] None  05/01/2021  1:00 PM [facility] PHASE II EXC [facility] None  05/03/2021  1:00 PM [facility] PHASE II EXC [facility] None  05/05/2021  1:00 PM [facility] PHASE II EXC [facility] None  05/08/2021  1:00 PM [facility] PHASE II EXC [facility] None  05/09/2021  1:30 PM Ladell Pier, MD [facility] None  05/10/2021  1:00 PM [facility] PHASE II EXC [facility] None  05/11/2021  1:00 PM [place] ECHO OP 1 [facility]  05/11/2021  2:20 PM Bensimhon, Shaune Pascal, MD [facility] None  05/12/2021  1:00 PM [facility] PHASE II EXC [facility] None  05/15/2021  1:00 PM [facility] PHASE II EXC [facility] None  05/17/2021  1:00 PM [facility] PHASE II EXC [facility] None  05/19/2021  1:00 PM [facility] PHASE II EXC [facility] None    BP 102/70   Pulse 96   Resp 18   Wt 207 lb (93.9 kg)   SpO2 97%   BMI 29.70 kg/m   Weight yesterday-207 Last visit weight-208  Pt was seen in clinic last week, metoprolol was added to replace  carvedilol.  She feels no difference with the switch.  She got the report back from the zio patch and reviewed with her last week, but no further chagnes at this time from that.  She reports feeling same. I advised her to still report it to the doctors if this continues.  She has not  been referred for sleep study. She reports her sister says she snores sometimes.  She is starting part time work this week as well.  She is also starting cardiac rehab.  I feel she is stable for d/c at this time. She is fully aware and capable of managing her own meds and appointments.    Patient is now discharged from [facility].  Patient has/has not met the following goals:  Yes :Patient expresses basic understanding of medications and what they are for Yes :Patient able to verbalize heart failure specific dietary/fluid restrictions Yes :Patient is aware of who to call if they have medical concerns or if they need to schedule or change appts Yes :Patient has a scale for daily weights and weighs regularly Yes :Patient able to verbalize concerning symptoms when they should call the HF clinic (weight gain ranges, etc) Yes :Patient has a PCP and has seen within the past year or has upcoming appt Yes :Patient has reliable access to getting their medications Yes :Patient has shown they are able to reorder medications reliably No :Patient has had admission in past 30 days- if yes how many? Yes :Patient has had admission in past 90 days- if yes how many? 01/20/21 (sob) admission  01/27/21 (sob) ER visit   Marylouise Stacks, [facility] Paramedic  03/22/21

## 2021-03-23 ENCOUNTER — Encounter (HOSPITAL_COMMUNITY): Payer: Self-pay

## 2021-03-23 ENCOUNTER — Other Ambulatory Visit: Payer: Self-pay

## 2021-03-23 ENCOUNTER — Encounter (HOSPITAL_COMMUNITY)
Admission: RE | Admit: 2021-03-23 | Discharge: 2021-03-23 | Disposition: A | Discharge status: Home / Self Care | Payer: 59 | Source: Ambulatory Visit | Attending: Internal Medicine | Admitting: Internal Medicine

## 2021-03-23 VITALS — BP 98/70 | HR 104 | Ht 70.5 in | Wt 208.8 lb

## 2021-03-23 DIAGNOSIS — I5042 Chronic combined systolic (congestive) and diastolic (congestive) heart failure: Secondary | ICD-10-CM | POA: Insufficient documentation

## 2021-03-23 NOTE — Progress Notes (Signed)
Cardiac Individual Treatment Plan  Patient Details  Name: Christine [name] MRN: [id number] Date of Birth: 07-17-1965 Referring Provider:   Flowsheet Row CARDIAC REHAB PHASE II ORIENTATION from 03/23/2021 in [place]  Referring Provider Glori Bickers, MD       Initial Encounter Date:  [place] PHASE II ORIENTATION from 03/23/2021 in [place]  Date 03/23/21       Visit Diagnosis: Chronic combined systolic and diastolic congestive heart failure ([place])  Patient's Home Medications on Admission:  Current Outpatient Medications:    acetaminophen (TYLENOL) 500 MG tablet, Take 500-1,000 mg by mouth every 6 (six) hours as needed., Disp: , Rfl:    ALPRAZolam (XANAX) 1 MG tablet, Take 2 mg by mouth 3 (three) times daily., Disp: , Rfl:    apixaban (ELIQUIS) 5 MG TABS tablet, Take 1 tablet (5 mg total) by mouth 2 (two) times daily., Disp: 60 tablet, Rfl: 0   atorvastatin (LIPITOR) 80 MG tablet, Take 1 tablet (80 mg total) by mouth daily., Disp: 30 tablet, Rfl: 3   cyclobenzaprine (FLEXERIL) 10 MG tablet, Take 20 mg by mouth at bedtime., Disp: , Rfl:    dapagliflozin propanediol (FARXIGA) 10 MG TABS tablet, Take 1 tablet (10 mg total) by mouth daily., Disp: 30 tablet, Rfl: 3   digoxin (LANOXIN) 0.125 MG tablet, Take 1 tablet (0.125 mg total) by mouth daily., Disp: 30 tablet, Rfl: 3   guaifenesin (HUMIBID E) 400 MG TABS tablet, Take 400-800 mg by mouth 3 (three) times daily as needed (congestion). (Patient not taking: Reported on 03/22/2021), Disp: , Rfl:    nicotine (NICODERM CQ - DOSED IN MG/24 HOURS) 21 mg/24hr patch, Place 1 patch (21 mg total) onto the skin daily., Disp: 28 patch, Rfl: 0   oxymetazoline (AFRIN) 0.05 % nasal spray, Place 2 sprays into both nostrils 2 (two) times daily as needed for congestion., Disp: , Rfl:    potassium chloride SA (KLOR-CON M) 20 MEQ tablet, Take 2 tablets (40 mEq total) by  mouth 2 (two) times daily. (Patient taking differently: Take 20 mEq by mouth in the morning, at noon, in the evening, and at bedtime.), Disp: 120 tablet, Rfl: 3   spironolactone (ALDACTONE) 25 MG tablet, Take 1 tablet (25 mg total) by mouth daily., Disp: 30 tablet, Rfl: 3   venlafaxine XR (EFFEXOR-XR) 75 MG 24 hr capsule, Take 75 mg by mouth in the morning, at noon, and at bedtime., Disp: , Rfl:    zolpidem (AMBIEN) 10 MG tablet, Take 5-10 mg by mouth at bedtime., Disp: , Rfl:    furosemide (LASIX) 40 MG tablet, Take 40 mg by mouth. Takes 40 mg 3 times a day to equal 120 mg, Disp: , Rfl:    metoprolol succinate (TOPROL-XL) 25 MG 24 hr tablet, Take 0.5 tablets (12.5 mg total) by mouth daily. Take with or immediately following a meal., Disp: 45 tablet, Rfl: 3  Past Medical History: Past Medical History:  Diagnosis Date   Anxiety 01/20/2021   CHF (congestive heart failure) (HCC)    Shortness of breath 01/20/2021   Tobacco abuse 01/20/2021    Tobacco Use: Social History   Tobacco Use  Smoking Status Former   Packs/day: 0.50   Types: Cigarettes   Quit date: 01/15/2021   Years since quitting: 0.1  Smokeless Tobacco Never    Labs: Recent Review Scientist, physiological     Labs for ITP Cardiac and Pulmonary Rehab Latest Ref Rng &  Units 01/23/2021 01/23/2021 01/23/2021 01/23/2021 01/24/2021   Cholestrol 0 - 200 mg/dL - - - - 216(H)   LDLCALC 0 - 99 mg/dL - - - - 137(H)   HDL >40 mg/dL - - - - 43   Trlycerides <150 mg/dL - - - - 179(H)   Hemoglobin A1c 4.8 - 5.6 % - - - - -   PHART 7.350 - 7.450 7.364 - - - -   PCO2ART 32.0 - 48.0 mmHg 40.1 - - - -   HCO3 20.0 - 28.0 mmol/L 22.9 24.7 25.7 22.7 -   TCO2 22 - 32 mmol/L 24 26 27 24  -   ACIDBASEDEF 0.0 - 2.0 mmol/L 2.0 1.0 - 3.0(H) -   O2SAT % 99.0 68.0 65.0 75.0 -       Capillary Blood Glucose: Lab Results  Component Value Date   GLUCAP 129 (H) 01/21/2021     Exercise Target Goals: Exercise Program Goal: Individual exercise  prescription set using results from initial 6 min walk test and THRR while considering  patient's activity barriers and safety.   Exercise Prescription Goal: Starting with aerobic activity 30 plus minutes a day, 3 days per week for initial exercise prescription. Provide home exercise prescription and guidelines that participant acknowledges understanding prior to discharge.  Activity Barriers & Risk Stratification:  Activity Barriers & Cardiac Risk Stratification - 03/23/21 1459       Activity Barriers & Cardiac Risk Stratification   Activity Barriers Deconditioning;Decreased Ventricular Function;Balance Concerns    Cardiac Risk Stratification High             6 Minute Walk:  6 Minute Walk     Row Name 03/23/21 1352         6 Minute Walk   Phase Initial     Distance 1471 feet     Walk Time 6 minutes     # of Rest Breaks 0     MPH 2.79     METS 4.05     RPE 9     Perceived Dyspnea  11     VO2 Peak 14.19     Symptoms No     Resting HR 98 bpm     Resting BP 98/70     Resting Oxygen Saturation  97 %     Exercise Oxygen Saturation  during 6 min walk 98 %     Max Ex. HR 126 bpm     Max Ex. BP 104/68     2 Minute Post BP 96/68              Oxygen Initial Assessment:   Oxygen Re-Evaluation:   Oxygen Discharge (Final Oxygen Re-Evaluation):   Initial Exercise Prescription:  Initial Exercise Prescription - 03/23/21 1500       Date of Initial Exercise RX and Referring Provider   Date 03/23/21    Referring Provider Glori Bickers, MD    Expected Discharge Date 05/19/21      NuStep   Level 2    SPM 80    Minutes 30    METs 2.5      Prescription Details   Frequency (times per week) 3    Duration Progress to 30 minutes of continuous aerobic without signs/symptoms of physical distress      Intensity   THRR 40-80% of Max Heartrate 67-134    Ratings of Perceived Exertion 11-13    Perceived Dyspnea 0-4      Progression   Progression Continue  progressive  overload as per policy without signs/symptoms or physical distress.      Resistance Training   Training Prescription Yes    Weight 3 lbs    Reps 10-15             Perform Capillary Blood Glucose checks as needed.  Exercise Prescription Changes:   Exercise Comments:   Exercise Goals and Review:   Exercise Goals     Row Name 03/23/21 1500             Exercise Goals   Increase Physical Activity Yes       Intervention Provide advice, education, support and counseling about physical activity/exercise needs.;Develop an individualized exercise prescription for aerobic and resistive training based on initial evaluation findings, risk stratification, comorbidities and participant's personal goals.       Expected Outcomes Short Term: Attend rehab on a regular basis to increase amount of physical activity.;Long Term: Add in home exercise to make exercise part of routine and to increase amount of physical activity.;Long Term: Exercising regularly at least 3-5 days a week.       Increase Strength and Stamina Yes       Intervention Provide advice, education, support and counseling about physical activity/exercise needs.;Develop an individualized exercise prescription for aerobic and resistive training based on initial evaluation findings, risk stratification, comorbidities and participant's personal goals.       Expected Outcomes Long Term: Improve cardiorespiratory fitness, muscular endurance and strength as measured by increased METs and functional capacity (6MWT);Short Term: Perform resistance training exercises routinely during rehab and add in resistance training at home;Short Term: Increase workloads from initial exercise prescription for resistance, speed, and METs.       Able to understand and use rate of perceived exertion (RPE) scale Yes       Intervention Provide education and explanation on how to use RPE scale       Expected Outcomes Short Term: Able to use RPE  daily in rehab to express subjective intensity level;Long Term:  Able to use RPE to guide intensity level when exercising independently       Knowledge and understanding of Target Heart Rate Range (THRR) Yes       Intervention Provide education and explanation of THRR including how the numbers were predicted and where they are located for reference       Expected Outcomes Short Term: Able to state/look up THRR;Long Term: Able to use THRR to govern intensity when exercising independently;Short Term: Able to use daily as guideline for intensity in rehab       Understanding of Exercise Prescription Yes       Intervention Provide education, explanation, and written materials on patient's individual exercise prescription       Expected Outcomes Short Term: Able to explain program exercise prescription;Long Term: Able to explain home exercise prescription to exercise independently                Exercise Goals Re-Evaluation :    Discharge Exercise Prescription (Final Exercise Prescription Changes):   Nutrition:  Target Goals: Understanding of nutrition guidelines, daily intake of sodium <154m, cholesterol <2098m calories 30% from fat and 7% or less from saturated fats, daily to have 5 or more servings of fruits and vegetables.  Biometrics:  Pre Biometrics - 03/23/21 1308       Pre Biometrics   Waist Circumference 41 inches    Hip Circumference 51 inches    Waist to Hip Ratio 0.8 %  Triceps Skinfold 25 mm    % Body Fat 39.4 %    Grip Strength 18 kg    Flexibility 21.25 in    Single Leg Stand 21.25 seconds              Nutrition Therapy Plan and Nutrition Goals:   Nutrition Assessments:  MEDIFICTS Score Key: ?70 Need to make dietary changes  40-70 Heart Healthy Diet ? 40 Therapeutic Level Cholesterol Diet   Picture Your Plate Scores: <25 Unhealthy dietary pattern with much room for improvement. 41-50 Dietary pattern unlikely to meet recommendations for good  health and room for improvement. 51-60 More healthful dietary pattern, with some room for improvement.  >60 Healthy dietary pattern, although there may be some specific behaviors that could be improved.    Nutrition Goals Re-Evaluation:   Nutrition Goals Discharge (Final Nutrition Goals Re-Evaluation):   Psychosocial: Target Goals: Acknowledge presence or absence of significant depression and/or stress, maximize coping skills, provide positive support system. Participant is able to verbalize types and ability to use techniques and skills needed for reducing stress and depression.  Initial Review & Psychosocial Screening:  Initial Psych Review & Screening - 03/23/21 1657       Initial Review   Current issues with History of Depression      Family Dynamics   Good Support System? Yes   [name] lives with her sister and has a brother who lives nearby     Barriers   Psychosocial barriers to participate in program The patient should benefit from training in stress management and relaxation.      Screening Interventions   Interventions Encouraged to exercise;To provide support and resources with identified psychosocial needs    Expected Outcomes Long Term Goal: Stressors or current issues are controlled or eliminated.;Short Term goal: Identification and review with participant of any Quality of Life or Depression concerns found by scoring the questionnaire.;Long Term goal: The participant improves quality of Life and PHQ9 Scores as seen by post scores and/or verbalization of changes             Quality of Life Scores:  Quality of Life - 03/23/21 1450       Quality of Life   Select Quality of Life      Quality of Life Scores   Health/Function Pre 14.27 %    Socioeconomic Pre 18.38 %    Psych/Spiritual Pre 20.79 %    Family Pre 19.88 %    GLOBAL Pre 17.24 %            Scores of 19 and below usually indicate a poorer quality of life in these areas.  A difference of  2-3  points is a clinically meaningful difference.  A difference of 2-3 points in the total score of the Quality of Life Index has been associated with significant improvement in overall quality of life, self-image, physical symptoms, and general health in studies assessing change in quality of life.  PHQ-9: Recent Review Flowsheet Data     Depression screen [facility] 2/9 03/23/2021 02/06/2021   Decreased Interest 0 0   Down, Depressed, Hopeless 0 1   PHQ - 2 Score 0 1      Interpretation of Total Score  Total Score Depression Severity:  1-4 = Minimal depression, 5-9 = Mild depression, 10-14 = Moderate depression, 15-19 = Moderately severe depression, 20-27 = Severe depression   Psychosocial Evaluation and Intervention:   Psychosocial Re-Evaluation:   Psychosocial Discharge (Final Psychosocial Re-Evaluation):  Vocational Rehabilitation: Provide vocational rehab assistance to qualifying candidates.   Vocational Rehab Evaluation & Intervention:  Vocational Rehab - 03/23/21 1659       Initial Vocational Rehab Evaluation & Intervention   Assessment shows need for Vocational Rehabilitation No   [place] works part time and does not need vocational rehab at this time.            Education: Education Goals: Education classes will be provided on a weekly basis, covering required topics. Participant will state understanding/return demonstration of topics presented.  Learning Barriers/Preferences:  Learning Barriers/Preferences - 03/23/21 1457       Learning Barriers/Preferences   Learning Barriers None    Learning Preferences Audio;Verbal Instruction             Education Topics: Hypertension, Hypertension Reduction -Define heart disease and high blood pressure. Discus how high blood pressure affects the body and ways to reduce high blood pressure.   Exercise and Your Heart -Discuss why it is important to exercise, the FITT principles of exercise, normal and abnormal responses  to exercise, and how to exercise safely.   Angina -Discuss definition of angina, causes of angina, treatment of angina, and how to decrease risk of having angina.   Cardiac Medications -Review what the following cardiac medications are used for, how they affect the body, and side effects that may occur when taking the medications.  Medications include Aspirin, Beta blockers, calcium channel blockers, ACE Inhibitors, angiotensin receptor blockers, diuretics, digoxin, and antihyperlipidemics.   Congestive Heart Failure -Discuss the definition of CHF, how to live with CHF, the signs and symptoms of CHF, and how keep track of weight and sodium intake.   Heart Disease and Intimacy -Discus the effect sexual activity has on the heart, how changes occur during intimacy as we age, and safety during sexual activity.   Smoking Cessation / COPD -Discuss different methods to quit smoking, the health benefits of quitting smoking, and the definition of COPD.   Nutrition I: Fats -Discuss the types of cholesterol, what cholesterol does to the heart, and how cholesterol levels can be controlled.   Nutrition II: Labels -Discuss the different components of food labels and how to read food label   Heart Parts/Heart Disease and PAD -Discuss the anatomy of the heart, the pathway of blood circulation through the heart, and these are affected by heart disease.   Stress I: Signs and Symptoms -Discuss the causes of stress, how stress may lead to anxiety and depression, and ways to limit stress.   Stress II: Relaxation -Discuss different types of relaxation techniques to limit stress.   Warning Signs of Stroke / TIA -Discuss definition of a stroke, what the signs and symptoms are of a stroke, and how to identify when someone is having stroke.   Knowledge Questionnaire Score:  Knowledge Questionnaire Score - 03/23/21 1451       Knowledge Questionnaire Score   Pre Score 26/28              Core Components/Risk Factors/Patient Goals at Admission:  Personal Goals and Risk Factors at Admission - 03/23/21 1451       Core Components/Risk Factors/Patient Goals on Admission    Weight Management Yes;Weight Loss    Intervention Weight Management: Develop a combined nutrition and exercise program designed to reach desired caloric intake, while maintaining appropriate intake of nutrient and fiber, sodium and fats, and appropriate energy expenditure required for the weight goal.;Weight Management: Provide education and appropriate resources to help participant  work on and attain dietary goals.;Weight Management/Obesity: Establish reasonable short term and long term weight goals.    Admit Weight 208 lb 12.4 oz (94.7 kg)    Expected Outcomes Short Term: Continue to assess and modify interventions until short term weight is achieved;Long Term: Adherence to nutrition and physical activity/exercise program aimed toward attainment of established weight goal;Weight Maintenance: Understanding of the daily nutrition guidelines, which includes 25-35% calories from fat, 7% or less cal from saturated fats, less than 273m cholesterol, less than 1.5gm of sodium, & 5 or more servings of fruits and vegetables daily;Weight Loss: Understanding of general recommendations for a balanced deficit meal plan, which promotes 1-2 lb weight loss per week and includes a negative energy balance of [phone] kcal/d;Understanding recommendations for meals to include 15-35% energy as protein, 25-35% energy from fat, 35-60% energy from carbohydrates, less than 2029mof dietary cholesterol, 20-35 gm of total fiber daily;Understanding of distribution of calorie intake throughout the day with the consumption of 4-5 meals/snacks    Heart Failure Yes    Intervention Provide a combined exercise and nutrition program that is supplemented with education, support and counseling about heart failure. Directed toward relieving symptoms such  as shortness of breath, decreased exercise tolerance, and extremity edema.    Expected Outcomes Long term: Adoption of self-care skills and reduction of barriers for early signs and symptoms recognition and intervention leading to self-care maintenance.;Short term: Daily weights obtained and reported for increase. Utilizing diuretic protocols set by physician.;Short term: Attendance in program 2-3 days a week with increased exercise capacity. Reported lower sodium intake. Reported increased fruit and vegetable intake. Reports medication compliance.;Improve functional capacity of life    Hypertension --    Intervention --    Expected Outcomes --    Lipids Yes    Intervention Provide education and support for participant on nutrition & aerobic/resistive exercise along with prescribed medications to achieve LDL <7019mHDL >10m79m  Expected Outcomes Short Term: Participant states understanding of desired cholesterol values and is compliant with medications prescribed. Participant is following exercise prescription and nutrition guidelines.;Long Term: Cholesterol controlled with medications as prescribed, with individualized exercise RX and with personalized nutrition plan. Value goals: LDL < 70mg80mL > 40 mg.    Stress Yes    Intervention Offer individual and/or small group education and counseling on adjustment to heart disease, stress management and health-related lifestyle change. Teach and support self-help strategies.;Refer participants experiencing significant psychosocial distress to appropriate mental health specialists for further evaluation and treatment. When possible, include family members and significant others in education/counseling sessions.    Expected Outcomes Short Term: Participant demonstrates changes in health-related behavior, relaxation and other stress management skills, ability to obtain effective social support, and compliance with psychotropic medications if prescribed.;Long  Term: Emotional wellbeing is indicated by absence of clinically significant psychosocial distress or social isolation.             Core Components/Risk Factors/Patient Goals Review:    Core Components/Risk Factors/Patient Goals at Discharge (Final Review):    ITP Comments:  ITP Comments     Row Name 03/23/21 1332           ITP Comments Dr TraciFransico HimMedical Director                Comments:[name] attended orientation on 03/23/2021 to review rules and guidelines for program.  Completed 6 minute walk test, Intitial ITP, and exercise prescription.  VSS. Telemetry-Sinus Rhythm.  Asymptomatic. Safety measures and  social distancing in place per CDC guidelines.Harrell Gave RN BSN

## 2021-03-27 ENCOUNTER — Encounter (HOSPITAL_COMMUNITY): Payer: 59

## 2021-03-29 ENCOUNTER — Other Ambulatory Visit: Payer: Self-pay

## 2021-03-29 ENCOUNTER — Other Ambulatory Visit (HOSPITAL_COMMUNITY): Payer: Self-pay

## 2021-03-29 ENCOUNTER — Encounter (HOSPITAL_COMMUNITY)
Admission: RE | Admit: 2021-03-29 | Discharge: 2021-03-29 | Disposition: A | Discharge status: Home / Self Care | Payer: 59 | Source: Ambulatory Visit | Attending: Internal Medicine | Admitting: Internal Medicine

## 2021-03-29 DIAGNOSIS — I5042 Chronic combined systolic (congestive) and diastolic (congestive) heart failure: Secondary | ICD-10-CM | POA: Diagnosis not present

## 2021-03-29 MED FILL — Atorvastatin Calcium Tab 80 MG (Base Equivalent): ORAL | 30 days supply | Qty: 30 | Fill #1 | Status: AC

## 2021-03-29 MED FILL — Digoxin Tab 125 MCG (0.125 MG): ORAL | 30 days supply | Qty: 30 | Fill #1 | Status: AC

## 2021-03-29 NOTE — Progress Notes (Signed)
Daily Session Note  Patient Details  Name: Christine [name] MRN: [id number] Date of Birth: 1966/03/03 Referring Provider:   Flowsheet Row CARDIAC REHAB PHASE II ORIENTATION from 03/23/2021 in [place]  Referring Provider Glori Bickers, MD       Encounter Date: 03/29/2021  Check In:  Session Check In - 03/29/21 1307       Check-In   Supervising physician immediately available to respond to emergencies [facility] immediately available    Physician(s) Dr. Maren Beach    Location [facility]    Staff Present Barnet Pall, RN, BSN;Jetta Walker BS, ACSM EP-C, Exercise Physiologist;Carlette Wilber Oliphant, RN, Deland Pretty, MS, ACSM CEP, Exercise Physiologist;David Makemson, MS, ACSM-CEP, CCRP, Exercise Physiologist    Virtual Visit No    Medication changes reported     No    Fall or balance concerns reported    No    Tobacco Cessation No Change    Current number of cigarettes/nicotine per day     0   uses a nicotene patch   Warm-up and Cool-down Performed as group-led instruction    Resistance Training Performed No    VAD Patient? No    PAD/SET Patient? No      Pain Assessment   Currently in Pain? No/denies    Pain Score 0-No pain    Multiple Pain Sites No             Capillary Blood Glucose: No results found for this or any previous visit (from the past 24 hour(s)).   Exercise Prescription Changes - 03/29/21 1600       Response to Exercise   Blood Pressure (Admit) 96/61    Blood Pressure (Exercise) 110/70    Blood Pressure (Exit) 88/56   Rechk was 114/77   Heart Rate (Admit) 101 bpm    Heart Rate (Exercise) 115 bpm    Heart Rate (Exit) 100 bpm    Rating of Perceived Exertion (Exercise) 13    Symptoms None    Comments Pt's first day in the CRP2 program    Duration Continue with 30 min of aerobic exercise without signs/symptoms of physical distress.    Intensity THRR unchanged      Progression    Progression Continue to progress workloads to maintain intensity without signs/symptoms of physical distress.    Average METs 1.9      Resistance Training   Training Prescription No    Weight No weights on wednesdays      Interval Training   Interval Training No      NuStep   Level 2    SPM 80    Minutes 30    METs 1.9             Social History   Tobacco Use  Smoking Status Former   Packs/day: 0.50   Types: Cigarettes   Quit date: 01/15/2021   Years since quitting: 0.2  Smokeless Tobacco Never    Goals Met:  Exercise tolerated well No report of concerns or symptoms today  Goals Unmet:  Not Applicable  Comments: Pt started cardiac rehab today.  Pt tolerated light exercise without difficulty. Post exercise BP 88/56. Patient was given water. Recheck exit BP 114/77 telemetry-Sinus Rhythm, asymptomatic.  Medication list reconciled. Pt denies barriers to medicaiton compliance.  PSYCHOSOCIAL ASSESSMENT:  PHQ-0. Pt exhibits positive coping skills, hopeful outlook with supportive family. No psychosocial needs identified at this time, no psychosocial interventions necessary.    Pt enjoys  reading and spending time with family.   Pt oriented to exercise equipment and routine.    Understanding verbalized. Patient given dietary handouts as she is interested in weight loss.Will continue to monitor the patient throughout  the program. Barnet Pall, RN,BSN 03/29/2021 5:12 PM    Dr. Fransico Him is Medical Director for Cardiac Rehab at [facility].

## 2021-03-30 NOTE — Progress Notes (Signed)
QUALITY OF LIFE SCORE REVIEW  Pt completed Quality of Life survey as a participant in Cardiac Rehab.  Scores 21.0 or below are considered low.  Pt score very low in several areas Overall 17.24, Health and Function 14.27, socioeconomic 18.38, physiological and spiritual 20.79, family 19.88. Patient quality of life slightly altered by physical constraints which limits ability to perform as prior to recent cardiac illness. Christine [name] is dissatisfied with her health due to CHF diagnosis.  Offered emotional support and reassurance.  Will continue to monitor and intervene as necessary.Christine [name] hopes that participating phase 2 will build up her endurance. [name] denies being depressed.Barnet Pall, RN,BSN 03/30/2021 3:47 PM

## 2021-03-31 ENCOUNTER — Other Ambulatory Visit: Payer: Self-pay

## 2021-03-31 ENCOUNTER — Encounter (HOSPITAL_COMMUNITY)
Admission: RE | Admit: 2021-03-31 | Discharge: 2021-03-31 | Disposition: A | Discharge status: Home / Self Care | Payer: 59 | Source: Ambulatory Visit | Attending: Internal Medicine | Admitting: Internal Medicine

## 2021-03-31 DIAGNOSIS — I5042 Chronic combined systolic (congestive) and diastolic (congestive) heart failure: Secondary | ICD-10-CM | POA: Diagnosis not present

## 2021-04-03 ENCOUNTER — Encounter (HOSPITAL_COMMUNITY): Payer: 59

## 2021-04-03 ENCOUNTER — Telehealth (HOSPITAL_COMMUNITY): Payer: Self-pay | Admitting: Licensed Clinical Social Worker

## 2021-04-03 NOTE — Telephone Encounter (Signed)
Patient called to share that she was denied medicaid and not sure about next steps for paying her medical bill. She states she still has a pending disability application.Patient will bring paperwork from DSS to meet with CSW on Wednesday April 05, 2021. CSW available and will continue to follow. Raquel Sarna, [place], [place]

## 2021-04-03 NOTE — Telephone Encounter (Signed)
Patient called to share that she received a letter from [facility] stating that she was denied for full medicaid but approved for family planning. Patient also received a letter stating that she does not meet criteria for SSI disability. She has not heard anything further form [employer] regarding approval. Patient will bring letters to meet with CSW on Wednesday April 05, 2021. CSW will be available to meet with patient at that time. Raquel Sarna, [place], [place]

## 2021-04-04 ENCOUNTER — Other Ambulatory Visit (HOSPITAL_COMMUNITY): Payer: Self-pay

## 2021-04-04 NOTE — Progress Notes (Signed)
Cardiac Individual Treatment Plan  Patient Details  Name: Christine [name] MRN: [id number] Date of Birth: 23-Jan-1966 Referring Provider:   Flowsheet Row CARDIAC REHAB PHASE II ORIENTATION from 03/23/2021 in [place]  Referring Provider Glori Bickers, MD       Initial Encounter Date:  [place] PHASE II ORIENTATION from 03/23/2021 in [place]  Date 03/23/21       Visit Diagnosis: Chronic combined systolic and diastolic congestive heart failure ([place])  Patient's Home Medications on Admission:  Current Outpatient Medications:    acetaminophen (TYLENOL) 500 MG tablet, Take 500-1,000 mg by mouth every 6 (six) hours as needed., Disp: , Rfl:    ALPRAZolam (XANAX) 1 MG tablet, Take 2 mg by mouth 3 (three) times daily., Disp: , Rfl:    apixaban (ELIQUIS) 5 MG TABS tablet, Take 1 tablet (5 mg total) by mouth 2 (two) times daily., Disp: 60 tablet, Rfl: 0   atorvastatin (LIPITOR) 80 MG tablet, Take 1 tablet (80 mg total) by mouth daily., Disp: 30 tablet, Rfl: 3   cyclobenzaprine (FLEXERIL) 10 MG tablet, Take 20 mg by mouth at bedtime., Disp: , Rfl:    dapagliflozin propanediol (FARXIGA) 10 MG TABS tablet, Take 1 tablet (10 mg total) by mouth daily., Disp: 30 tablet, Rfl: 3   digoxin (LANOXIN) 0.125 MG tablet, Take 1 tablet (0.125 mg total) by mouth daily., Disp: 30 tablet, Rfl: 3   furosemide (LASIX) 40 MG tablet, Take 1 tablet (40 mg total) by mouth 2 (two) times daily. (Patient taking differently: Take 40 mg by mouth in the morning, at noon, and at bedtime.), Disp: 60 tablet, Rfl: 3   furosemide (LASIX) 40 MG tablet, Take 40 mg by mouth. Takes 40 mg 3 times a day to equal 120 mg, Disp: , Rfl:    guaifenesin (HUMIBID E) 400 MG TABS tablet, Take 400-800 mg by mouth 3 (three) times daily as needed (congestion). (Patient not taking: Reported on 03/22/2021), Disp: , Rfl:    metoprolol succinate (TOPROL-XL) 25 MG 24  hr tablet, Take 0.5 tablets (12.5 mg total) by mouth daily. Take with or immediately following a meal., Disp: 45 tablet, Rfl: 3   nicotine (NICODERM CQ - DOSED IN MG/24 HOURS) 21 mg/24hr patch, Place 1 patch (21 mg total) onto the skin daily., Disp: 28 patch, Rfl: 0   oxymetazoline (AFRIN) 0.05 % nasal spray, Place 2 sprays into both nostrils 2 (two) times daily as needed for congestion., Disp: , Rfl:    potassium chloride SA (KLOR-CON M) 20 MEQ tablet, Take 2 tablets (40 mEq total) by mouth 2 (two) times daily. (Patient taking differently: Take 20 mEq by mouth in the morning, at noon, in the evening, and at bedtime.), Disp: 120 tablet, Rfl: 3   spironolactone (ALDACTONE) 25 MG tablet, Take 1 tablet (25 mg total) by mouth daily., Disp: 30 tablet, Rfl: 3   venlafaxine XR (EFFEXOR-XR) 75 MG 24 hr capsule, Take 75 mg by mouth in the morning, at noon, and at bedtime., Disp: , Rfl:    zolpidem (AMBIEN) 10 MG tablet, Take 5-10 mg by mouth at bedtime., Disp: , Rfl:   Past Medical History: Past Medical History:  Diagnosis Date   Anxiety 01/20/2021   CHF (congestive heart failure) (HCC)    Shortness of breath 01/20/2021   Tobacco abuse 01/20/2021    Tobacco Use: Social History   Tobacco Use  Smoking Status Former   Packs/day: 0.50  Types: Cigarettes   Quit date: 01/15/2021   Years since quitting: 0.2  Smokeless Tobacco Never    Labs: Recent Review Flowsheet Data     Labs for ITP Cardiac and Pulmonary Rehab Latest Ref Rng & Units 01/23/2021 01/23/2021 01/23/2021 01/23/2021 01/24/2021   Cholestrol 0 - 200 mg/dL - - - - 216(H)   LDLCALC 0 - 99 mg/dL - - - - 137(H)   HDL >40 mg/dL - - - - 43   Trlycerides <150 mg/dL - - - - 179(H)   Hemoglobin A1c 4.8 - 5.6 % - - - - -   PHART 7.350 - 7.450 7.364 - - - -   PCO2ART 32.0 - 48.0 mmHg 40.1 - - - -   HCO3 20.0 - 28.0 mmol/L 22.9 24.7 25.7 22.7 -   TCO2 22 - 32 mmol/L 24 26 27 24  -   ACIDBASEDEF 0.0 - 2.0 mmol/L 2.0 1.0 - 3.0(H) -   O2SAT %  99.0 68.0 65.0 75.0 -       Capillary Blood Glucose: Lab Results  Component Value Date   GLUCAP 129 (H) 01/21/2021     Exercise Target Goals: Exercise Program Goal: Individual exercise prescription set using results from initial 6 min walk test and THRR while considering  patients activity barriers and safety.   Exercise Prescription Goal: Initial exercise prescription builds to 30-45 minutes a day of aerobic activity, 2-3 days per week.  Home exercise guidelines will be given to patient during program as part of exercise prescription that the participant will acknowledge.  Activity Barriers & Risk Stratification:  Activity Barriers & Cardiac Risk Stratification - 03/23/21 1459       Activity Barriers & Cardiac Risk Stratification   Activity Barriers Deconditioning;Decreased Ventricular Function;Balance Concerns    Cardiac Risk Stratification High             6 Minute Walk:  6 Minute Walk     Row Name 03/23/21 1352         6 Minute Walk   Phase Initial     Distance 1471 feet     Walk Time 6 minutes     # of Rest Breaks 0     MPH 2.79     METS 4.05     RPE 9     Perceived Dyspnea  11     VO2 Peak 14.19     Symptoms No     Resting HR 98 bpm     Resting BP 98/70     Resting Oxygen Saturation  97 %     Exercise Oxygen Saturation  during 6 min walk 98 %     Max Ex. HR 126 bpm     Max Ex. BP 104/68     2 Minute Post BP 96/68              Oxygen Initial Assessment:   Oxygen Re-Evaluation:   Oxygen Discharge (Final Oxygen Re-Evaluation):   Initial Exercise Prescription:  Initial Exercise Prescription - 03/23/21 1500       Date of Initial Exercise RX and Referring Provider   Date 03/23/21    Referring Provider Glori Bickers, MD    Expected Discharge Date 05/19/21      NuStep   Level 2    SPM 80    Minutes 30    METs 2.5      Prescription Details   Frequency (times per week) 3    Duration Progress to 30 minutes of  continuous aerobic  without signs/symptoms of physical distress      Intensity   THRR 40-80% of Max Heartrate 67-134    Ratings of Perceived Exertion 11-13    Perceived Dyspnea 0-4      Progression   Progression Continue progressive overload as per policy without signs/symptoms or physical distress.      Resistance Training   Training Prescription Yes    Weight 3 lbs    Reps 10-15             Perform Capillary Blood Glucose checks as needed.  Exercise Prescription Changes:   Exercise Prescription Changes     Row Name 03/29/21 1600             Response to Exercise   Blood Pressure (Admit) 96/61       Blood Pressure (Exercise) 110/70       Blood Pressure (Exit) 88/56  Rechk was 114/77       Heart Rate (Admit) 101 bpm       Heart Rate (Exercise) 115 bpm       Heart Rate (Exit) 100 bpm       Rating of Perceived Exertion (Exercise) 13       Symptoms None       Comments Pt's first day in the [facility] program       Duration Continue with 30 min of aerobic exercise without signs/symptoms of physical distress.       Intensity THRR unchanged         Progression   Progression Continue to progress workloads to maintain intensity without signs/symptoms of physical distress.       Average METs 1.9         Resistance Training   Training Prescription No       Weight No weights on wednesdays         Interval Training   Interval Training No         NuStep   Level 2       SPM 80       Minutes 30       METs 1.9                Exercise Comments:   Exercise Comments     Row Name 03/29/21 1628           Exercise Comments Pt's first day in the [place] program. Pt completed session today with no complaints.                Exercise Goals and Review:   Exercise Goals     Row Name 03/23/21 1500             Exercise Goals   Increase Physical Activity Yes       Intervention Provide advice, education, support and counseling about physical activity/exercise needs.;Develop an  individualized exercise prescription for aerobic and resistive training based on initial evaluation findings, risk stratification, comorbidities and participant's personal goals.       Expected Outcomes Short Term: Attend rehab on a regular basis to increase amount of physical activity.;Long Term: Add in home exercise to make exercise part of routine and to increase amount of physical activity.;Long Term: Exercising regularly at least 3-5 days a week.       Increase Strength and Stamina Yes       Intervention Provide advice, education, support and counseling about physical activity/exercise needs.;Develop an individualized exercise prescription for aerobic and resistive training based on initial  evaluation findings, risk stratification, comorbidities and participant's personal goals.       Expected Outcomes Long Term: Improve cardiorespiratory fitness, muscular endurance and strength as measured by increased METs and functional capacity (6MWT);Short Term: Perform resistance training exercises routinely during rehab and add in resistance training at home;Short Term: Increase workloads from initial exercise prescription for resistance, speed, and METs.       Able to understand and use rate of perceived exertion (RPE) scale Yes       Intervention Provide education and explanation on how to use RPE scale       Expected Outcomes Short Term: Able to use RPE daily in rehab to express subjective intensity level;Long Term:  Able to use RPE to guide intensity level when exercising independently       Knowledge and understanding of Target Heart Rate Range (THRR) Yes       Intervention Provide education and explanation of THRR including how the numbers were predicted and where they are located for reference       Expected Outcomes Short Term: Able to state/look up THRR;Long Term: Able to use THRR to govern intensity when exercising independently;Short Term: Able to use daily as guideline for intensity in rehab        Understanding of Exercise Prescription Yes       Intervention Provide education, explanation, and written materials on patient's individual exercise prescription       Expected Outcomes Short Term: Able to explain program exercise prescription;Long Term: Able to explain home exercise prescription to exercise independently                Exercise Goals Re-Evaluation :  Exercise Goals Re-Evaluation     Row Name 03/29/21 1626             Exercise Goal Re-Evaluation   Exercise Goals Review Increase Physical Activity;Increase Strength and Stamina;Able to understand and use rate of perceived exertion (RPE) scale;Knowledge and understanding of Target Heart Rate Range (THRR);Understanding of Exercise Prescription       Comments Pt's first day in the [facility] program. Pt understnads the exercise Rx, THRR and RPE scale.       Expected Outcomes Will continue to montior patient and progress exercise workloads as tolerated.                Discharge Exercise Prescription (Final Exercise Prescription Changes):  Exercise Prescription Changes - 03/29/21 1600       Response to Exercise   Blood Pressure (Admit) 96/61    Blood Pressure (Exercise) 110/70    Blood Pressure (Exit) 88/56   Rechk was 114/77   Heart Rate (Admit) 101 bpm    Heart Rate (Exercise) 115 bpm    Heart Rate (Exit) 100 bpm    Rating of Perceived Exertion (Exercise) 13    Symptoms None    Comments Pt's first day in the [facility] program    Duration Continue with 30 min of aerobic exercise without signs/symptoms of physical distress.    Intensity THRR unchanged      Progression   Progression Continue to progress workloads to maintain intensity without signs/symptoms of physical distress.    Average METs 1.9      Resistance Training   Training Prescription No    Weight No weights on wednesdays      Interval Training   Interval Training No      NuStep   Level 2    SPM 80    Minutes 30  METs 1.9              Nutrition:  Target Goals: Understanding of nutrition guidelines, daily intake of sodium <1524m, cholesterol <2018m calories 30% from fat and 7% or less from saturated fats, daily to have 5 or more servings of fruits and vegetables.  Biometrics:  Pre Biometrics - 03/23/21 1308       Pre Biometrics   Waist Circumference 41 inches    Hip Circumference 51 inches    Waist to Hip Ratio 0.8 %    Triceps Skinfold 25 mm    % Body Fat 39.4 %    Grip Strength 18 kg    Flexibility 21.25 in    Single Leg Stand 21.25 seconds              Nutrition Therapy Plan and Nutrition Goals:   Nutrition Assessments:  MEDIFICTS Score Key: ?70 Need to make dietary changes  40-70 Heart Healthy Diet ? 40 Therapeutic Level Cholesterol Diet    Picture Your Plate Scores: <4<56nhealthy dietary pattern with much room for improvement. 41-50 Dietary pattern unlikely to meet recommendations for good health and room for improvement. 51-60 More healthful dietary pattern, with some room for improvement.  >60 Healthy dietary pattern, although there may be some specific behaviors that could be improved.    Nutrition Goals Re-Evaluation:   Nutrition Goals Re-Evaluation:   Nutrition Goals Discharge (Final Nutrition Goals Re-Evaluation):   Psychosocial: Target Goals: Acknowledge presence or absence of significant depression and/or stress, maximize coping skills, provide positive support system. Participant is able to verbalize types and ability to use techniques and skills needed for reducing stress and depression.  Initial Review & Psychosocial Screening:  Initial Psych Review & Screening - 03/23/21 1657       Initial Review   Current issues with History of Depression      Family Dynamics   Good Support System? Yes   Christine [name] lives with her sister and has a brother who lives nearby     Barriers   Psychosocial barriers to participate in program The patient should benefit from training in  stress management and relaxation.      Screening Interventions   Interventions Encouraged to exercise;To provide support and resources with identified psychosocial needs    Expected Outcomes Long Term Goal: Stressors or current issues are controlled or eliminated.;Short Term goal: Identification and review with participant of any Quality of Life or Depression concerns found by scoring the questionnaire.;Long Term goal: The participant improves quality of Life and PHQ9 Scores as seen by post scores and/or verbalization of changes             Quality of Life Scores:  Quality of Life - 03/23/21 1450       Quality of Life   Select Quality of Life      Quality of Life Scores   Health/Function Pre 14.27 %    Socioeconomic Pre 18.38 %    Psych/Spiritual Pre 20.79 %    Family Pre 19.88 %    GLOBAL Pre 17.24 %            Scores of 19 and below usually indicate a poorer quality of life in these areas.  A difference of  2-3 points is a clinically meaningful difference.  A difference of 2-3 points in the total score of the Quality of Life Index has been associated with significant improvement in overall quality of life, self-image, physical symptoms, and general health in studies  assessing change in quality of life.  PHQ-9: Recent Review Flowsheet Data     Depression screen [facility] 2/9 03/23/2021 02/06/2021   Decreased Interest 0 0   Down, Depressed, Hopeless 0 1   PHQ - 2 Score 0 1      Interpretation of Total Score  Total Score Depression Severity:  1-4 = Minimal depression, 5-9 = Mild depression, 10-14 = Moderate depression, 15-19 = Moderately severe depression, 20-27 = Severe depression   Psychosocial Evaluation and Intervention:   Psychosocial Re-Evaluation:  Psychosocial Re-Evaluation     [place] Name 04/04/21 1531             Psychosocial Re-Evaluation   Current issues with History of Depression;Current Stress Concerns       Comments Reviewed quality of life  questionnaire with the patient. Christine [name] denies being depressed. Christine [name] has some financial stressors as she had to cut back her hours due to her recent hospitalization       Expected Outcomes Christine [name] will have decreased stressors upon the completion of phase 2 cardiac rehab       Interventions Encouraged to attend Cardiac Rehabilitation for the exercise;Stress management education       Continue Psychosocial Services  Follow up required by staff         Initial Review   Source of Stress Concerns Chronic Illness;Financial       Comments Will continue to monitor and offer support as needed                Psychosocial Discharge (Final Psychosocial Re-Evaluation):  Psychosocial Re-Evaluation - 04/04/21 1531       Psychosocial Re-Evaluation   Current issues with History of Depression;Current Stress Concerns    Comments Reviewed quality of life questionnaire with the patient. Christine [name] denies being depressed. Christine [name] has some financial stressors as she had to cut back her hours due to her recent hospitalization    Expected Outcomes Christine [name] will have decreased stressors upon the completion of phase 2 cardiac rehab    Interventions Encouraged to attend Cardiac Rehabilitation for the exercise;Stress management education    Continue Psychosocial Services  Follow up required by staff      Initial Review   Source of Stress Concerns Chronic Illness;Financial    Comments Will continue to monitor and offer support as needed             Vocational Rehabilitation: Provide vocational rehab assistance to qualifying candidates.   Vocational Rehab Evaluation & Intervention:  Vocational Rehab - 03/23/21 1659       Initial Vocational Rehab Evaluation & Intervention   Assessment shows need for Vocational Rehabilitation No   Danise works part time and does not need vocational rehab at this time.            Education: Education Goals: Education classes will be provided on a weekly basis, covering required  topics. Participant will state understanding/return demonstration of topics presented.  Learning Barriers/Preferences:  Learning Barriers/Preferences - 03/23/21 1457       Learning Barriers/Preferences   Learning Barriers None    Learning Preferences Audio;Verbal Instruction             Education Topics: Count Your Pulse:  -Group instruction provided by verbal instruction, demonstration, patient participation and written materials to support subject.  Instructors address importance of being able to find your pulse and how to count your pulse when at home without a heart monitor.  Patients get hands on experience counting their pulse with  staff help and individually.   Heart Attack, Angina, and Risk Factor Modification:  -Group instruction provided by verbal instruction, video, and written materials to support subject.  Instructors address signs and symptoms of angina and heart attacks.    Also discuss risk factors for heart disease and how to make changes to improve heart health risk factors.   Functional Fitness:  -Group instruction provided by verbal instruction, demonstration, patient participation, and written materials to support subject.  Instructors address safety measures for doing things around the house.  Discuss how to get up and down off the floor, how to pick things up properly, how to safely get out of a chair without assistance, and balance training.   Meditation and Mindfulness:  -Group instruction provided by verbal instruction, patient participation, and written materials to support subject.  Instructor addresses importance of mindfulness and meditation practice to help reduce stress and improve awareness.  Instructor also leads participants through a meditation exercise.    Stretching for Flexibility and Mobility:  -Group instruction provided by verbal instruction, patient participation, and written materials to support subject.  Instructors lead participants  through series of stretches that are designed to increase flexibility thus improving mobility.  These stretches are additional exercise for major muscle groups that are typically performed during regular warm up and cool down.   Hands Only CPR:  -Group verbal, video, and participation provides a basic overview of AHA guidelines for community CPR. Role-play of emergencies allow participants the opportunity to practice calling for help and chest compression technique with discussion of AED use.   Hypertension: -Group verbal and written instruction that provides a basic overview of hypertension including the most recent diagnostic guidelines, risk factor reduction with self-care instructions and medication management.    Nutrition I class: Heart Healthy Eating:  -Group instruction provided by PowerPoint slides, verbal discussion, and written materials to support subject matter. The instructor gives an explanation and review of the Therapeutic Lifestyle Changes diet recommendations, which includes a discussion on lipid goals, dietary fat, sodium, fiber, plant stanol/sterol esters, sugar, and the components of a well-balanced, healthy diet.   Nutrition II class: Lifestyle Skills:  -Group instruction provided by PowerPoint slides, verbal discussion, and written materials to support subject matter. The instructor gives an explanation and review of label reading, grocery shopping for heart health, heart healthy recipe modifications, and ways to make healthier choices when eating out.   Diabetes Question & Answer:  -Group instruction provided by PowerPoint slides, verbal discussion, and written materials to support subject matter. The instructor gives an explanation and review of diabetes co-morbidities, pre- and post-prandial blood glucose goals, pre-exercise blood glucose goals, signs, symptoms, and treatment of hypoglycemia and hyperglycemia, and foot care basics.   Diabetes Blitz:  -Group  instruction provided by PowerPoint slides, verbal discussion, and written materials to support subject matter. The instructor gives an explanation and review of the physiology behind type 1 and type 2 diabetes, diabetes medications and rational behind using different medications, pre- and post-prandial blood glucose recommendations and Hemoglobin A1c goals, diabetes diet, and exercise including blood glucose guidelines for exercising safely.    Portion Distortion:  -Group instruction provided by PowerPoint slides, verbal discussion, written materials, and food models to support subject matter. The instructor gives an explanation of serving size versus portion size, changes in portions sizes over the last 20 years, and what consists of a serving from each food group.   Stress Management:  -Group instruction provided by verbal instruction, video, and written  materials to support subject matter.  Instructors review role of stress in heart disease and how to cope with stress positively.     Exercising on Your Own:  -Group instruction provided by verbal instruction, power point, and written materials to support subject.  Instructors discuss benefits of exercise, components of exercise, frequency and intensity of exercise, and end points for exercise.  Also discuss use of nitroglycerin and activating EMS.  Review options of places to exercise outside of rehab.  Review guidelines for sex with heart disease.   Cardiac Drugs I:  -Group instruction provided by verbal instruction and written materials to support subject.  Instructor reviews cardiac drug classes: antiplatelets, anticoagulants, beta blockers, and statins.  Instructor discusses reasons, side effects, and lifestyle considerations for each drug class.   Cardiac Drugs II:  -Group instruction provided by verbal instruction and written materials to support subject.  Instructor reviews cardiac drug classes: angiotensin converting enzyme inhibitors  (ACE-I), angiotensin II receptor blockers (ARBs), nitrates, and calcium channel blockers.  Instructor discusses reasons, side effects, and lifestyle considerations for each drug class.   Anatomy and Physiology of the Circulatory System:  Group verbal and written instruction and models provide basic cardiac anatomy and physiology, with the coronary electrical and arterial systems. Review of: AMI, Angina, Valve disease, Heart Failure, Peripheral Artery Disease, Cardiac Arrhythmia, Pacemakers, and the ICD.   Other Education:  -Group or individual verbal, written, or video instructions that support the educational goals of the cardiac rehab program.   Holiday Eating Survival Tips:  -Group instruction provided by PowerPoint slides, verbal discussion, and written materials to support subject matter. The instructor gives patients tips, tricks, and techniques to help them not only survive but enjoy the holidays despite the onslaught of food that accompanies the holidays.   Knowledge Questionnaire Score:  Knowledge Questionnaire Score - 03/23/21 1451       Knowledge Questionnaire Score   Pre Score 26/28             Core Components/Risk Factors/Patient Goals at Admission:  Personal Goals and Risk Factors at Admission - 03/23/21 1451       Core Components/Risk Factors/Patient Goals on Admission    Weight Management Yes;Weight Loss    Intervention Weight Management: Develop a combined nutrition and exercise program designed to reach desired caloric intake, while maintaining appropriate intake of nutrient and fiber, sodium and fats, and appropriate energy expenditure required for the weight goal.;Weight Management: Provide education and appropriate resources to help participant work on and attain dietary goals.;Weight Management/Obesity: Establish reasonable short term and long term weight goals.    Admit Weight 208 lb 12.4 oz (94.7 kg)    Expected Outcomes Short Term: Continue to assess and  modify interventions until short term weight is achieved;Long Term: Adherence to nutrition and physical activity/exercise program aimed toward attainment of established weight goal;Weight Maintenance: Understanding of the daily nutrition guidelines, which includes 25-35% calories from fat, 7% or less cal from saturated fats, less than 266m cholesterol, less than 1.5gm of sodium, & 5 or more servings of fruits and vegetables daily;Weight Loss: Understanding of general recommendations for a balanced deficit meal plan, which promotes 1-2 lb weight loss per week and includes a negative energy balance of [phone] kcal/d;Understanding recommendations for meals to include 15-35% energy as protein, 25-35% energy from fat, 35-60% energy from carbohydrates, less than 2034mof dietary cholesterol, 20-35 gm of total fiber daily;Understanding of distribution of calorie intake throughout the day with the consumption of 4-5 meals/snacks  Heart Failure Yes    Intervention Provide a combined exercise and nutrition program that is supplemented with education, support and counseling about heart failure. Directed toward relieving symptoms such as shortness of breath, decreased exercise tolerance, and extremity edema.    Expected Outcomes Long term: Adoption of self-care skills and reduction of barriers for early signs and symptoms recognition and intervention leading to self-care maintenance.;Short term: Daily weights obtained and reported for increase. Utilizing diuretic protocols set by physician.;Short term: Attendance in program 2-3 days a week with increased exercise capacity. Reported lower sodium intake. Reported increased fruit and vegetable intake. Reports medication compliance.;Improve functional capacity of life    Hypertension --    Intervention --    Expected Outcomes --    Lipids Yes    Intervention Provide education and support for participant on nutrition & aerobic/resistive exercise along with prescribed  medications to achieve LDL <33m, HDL >458m    Expected Outcomes Short Term: Participant states understanding of desired cholesterol values and is compliant with medications prescribed. Participant is following exercise prescription and nutrition guidelines.;Long Term: Cholesterol controlled with medications as prescribed, with individualized exercise RX and with personalized nutrition plan. Value goals: LDL < 7051mHDL > 40 mg.    Stress Yes    Intervention Offer individual and/or small group education and counseling on adjustment to heart disease, stress management and health-related lifestyle change. Teach and support self-help strategies.;Refer participants experiencing significant psychosocial distress to appropriate mental health specialists for further evaluation and treatment. When possible, include family members and significant others in education/counseling sessions.    Expected Outcomes Short Term: Participant demonstrates changes in health-related behavior, relaxation and other stress management skills, ability to obtain effective social support, and compliance with psychotropic medications if prescribed.;Long Term: Emotional wellbeing is indicated by absence of clinically significant psychosocial distress or social isolation.             Core Components/Risk Factors/Patient Goals Review:   Goals and Risk Factor Review     Row Name 04/04/21 1536             Core Components/Risk Factors/Patient Goals Review   Personal Goals Review Weight Management/Obesity;Lipids;Diabetes;Stress       Review Christine [name] off to a good start to exercise at cardiac rehab. Systolic BP's have been in the 90's. Christine Maizes been asymptomatic.       Expected Outcomes Christine [name] will contiue to participate in phase 2 cardiac rehab for exercise, nutrition and life style modifications.                Core Components/Risk Factors/Patient Goals at Discharge (Final Review):   Goals and Risk Factor Review -  04/04/21 1536       Core Components/Risk Factors/Patient Goals Review   Personal Goals Review Weight Management/Obesity;Lipids;Diabetes;Stress    Review Christine [name] off to a good start to exercise at cardiac rehab. Systolic BP's have been in the 90's. Christine Maizes been asymptomatic.    Expected Outcomes Christine [name] will contiue to participate in phase 2 cardiac rehab for exercise, nutrition and life style modifications.             ITP Comments:  ITP Comments     Row Name 03/23/21 1332 04/04/21 1527         ITP Comments Dr TraFransico Him, Medical Director 30 Day ITP Review. Christine [name] off to a good start to exercise at cardiac rehab.  Comments: See ITP comments

## 2021-04-05 ENCOUNTER — Encounter (HOSPITAL_COMMUNITY): Payer: 59

## 2021-04-05 ENCOUNTER — Telehealth (HOSPITAL_COMMUNITY): Payer: Self-pay | Admitting: Licensed Clinical Social Worker

## 2021-04-05 NOTE — Telephone Encounter (Signed)
Patient called to say that she will not be able to meet with CSW today to review paperwork received from [facility]. Patient will come to clinic on Friday and was told to ask for Tammy Sours, CSW. Patient verbalizes understanding. CSW available as needed. Raquel Sarna, [place], [place]

## 2021-04-06 ENCOUNTER — Other Ambulatory Visit (HOSPITAL_COMMUNITY): Payer: Self-pay

## 2021-04-07 ENCOUNTER — Telehealth (HOSPITAL_COMMUNITY): Payer: Self-pay | Admitting: Licensed Clinical Social Worker

## 2021-04-07 ENCOUNTER — Encounter (HOSPITAL_COMMUNITY)
Admission: RE | Admit: 2021-04-07 | Discharge: 2021-04-07 | Disposition: A | Discharge status: Home / Self Care | Payer: 59 | Source: Ambulatory Visit | Attending: Internal Medicine | Admitting: Internal Medicine

## 2021-04-07 ENCOUNTER — Other Ambulatory Visit: Payer: Self-pay

## 2021-04-07 DIAGNOSIS — I5042 Chronic combined systolic (congestive) and diastolic (congestive) heart failure: Secondary | ICD-10-CM

## 2021-04-07 NOTE — Progress Notes (Signed)
Heart and Vascular Care Navigation  04/07/2021  Christine [name] 06-24-1965 [id number]  Reason for Referral: help review letters from [facility] regarding Medicaid application   Engaged with patient face to face for follow up visit for Heart and Vascular Care Coordination.                                                                                                   Assessment:    Pt received several notices in the mail regarding medicaid application and was unsure what was going on.  CSW helped pt review.  Pt was denied for Medicaid as she did not have a qualifying disability per the notice.  Pt is confused why she does not qualify with EF of 20-25%- CSW explained they might have seen evidence that made them not think this condition would last for a year or more but she should call DSS case worker to get details on reason for denial.    Pt has insurance now but it did not take affect until her hospital stay so she is worried how she can pay for this.CSW provided pt with CAFA and explained how to apply to help with past hospital bill.    Pt reports she has everything she needs at this time but that it would be nice to get help with food through food stamps.  CSW provided with list of food pantries and instructions on how to apply for food stamps online.                                     HRT/VAS Care Coordination     Patients Home Cardiology Office [facility] Failure Clinic   Outpatient Care Team [facility]   Community Paramedic Name: Christine [name] 03/22/21 as no longer needing services   Social Worker Name: Christine [name]- [facility]- [phone]   Living arrangements for the past 2 months Single Family Home   Lives with: Siblings   Patient Current Insurance Coverage Self-Pay   Patient Has Concern With Paying Medical Bills Yes   Medical Bill Referrals: Will have a policy through [facility] on [phone]   Does Patient Have Prescription Coverage? No  Will have a plan on 02-14-21   Patient  Prescription Assistance Programs Heart Failure Fund       Social History:                                                                             SDOH Screenings   Alcohol Screen: Not on file  Depression (PHQ2-9): Low Risk    PHQ-2 Score: 0  Financial Resource Strain: Medium Risk   Difficulty of Paying Living Expenses: Somewhat hard  Food Insecurity: No Food Insecurity   Worried About Running  Out of Food in the Last Year: Never true   Ran Out of Food in the Last Year: Never true  Housing: Low Risk    Last Housing Risk Score: 0  Physical Activity: Not on file  Social Connections: Not on file  Stress: Not on file  Tobacco Use: Medium Risk   Smoking Tobacco Use: Former   Smokeless Tobacco Use: Never   Passive Exposure: Not on file  Transportation Needs: No Transportation Needs   Lack of Transportation (Medical): No   Lack of Transportation (Non-Medical): No    SDOH Interventions: Financial Resources:    Occupational hygienist for [employer] Insecurity:   None reported but provided info regarding food stamp application  Housing Insecurity:  None reported  Transportation:    No concerns- has car   Follow-up plan:    Pt to apply for food stamps online, apply for CAFA to help with hospital bills and follow up with DSS case worker regarding disability application.  Will continue to follow and assist as needed  Christine [name], [facility] Desk#: [phone] Cell#: [phone]

## 2021-04-12 ENCOUNTER — Telehealth (HOSPITAL_COMMUNITY): Payer: Self-pay

## 2021-04-12 ENCOUNTER — Encounter (HOSPITAL_COMMUNITY): Payer: 59

## 2021-04-12 ENCOUNTER — Other Ambulatory Visit (HOSPITAL_COMMUNITY): Payer: Self-pay

## 2021-04-12 MED ORDER — FUROSEMIDE 20 MG PO TABS
60.0000 mg | ORAL_TABLET | Freq: Two times a day (BID) | ORAL | 0 refills | Status: DC
  Filled 2021-04-12: qty 504, 84d supply, fill #0

## 2021-04-12 NOTE — Telephone Encounter (Signed)
What is her weight? Is she swelling or short of breath? Monitor weight, keep salt intake <2 grams/day and fluids < 2L/day.   Please resend Lasix Rx as 60 mg bid, 90 day supply.  Spoke with patient current weight 211lb small bit of swelling no feet. Understands and is agreeable to orders. Script for updated lasix sent in to pharmacy.

## 2021-04-14 ENCOUNTER — Encounter (HOSPITAL_COMMUNITY): Payer: 59

## 2021-04-18 ENCOUNTER — Other Ambulatory Visit (HOSPITAL_COMMUNITY): Payer: Self-pay

## 2021-04-19 ENCOUNTER — Encounter (HOSPITAL_COMMUNITY): Payer: 59

## 2021-04-21 ENCOUNTER — Telehealth (HOSPITAL_COMMUNITY): Payer: Self-pay | Admitting: *Deleted

## 2021-04-21 ENCOUNTER — Encounter (HOSPITAL_COMMUNITY): Payer: 59

## 2021-04-21 NOTE — Telephone Encounter (Signed)
Spoke with Christine [name] she has had an upset stomach today. [name] plans to return to exercise on Monday.Barnet Pall, RN,BSN 04/21/2021 1:35 PM

## 2021-04-24 ENCOUNTER — Other Ambulatory Visit (HOSPITAL_COMMUNITY): Payer: 59

## 2021-04-24 ENCOUNTER — Ambulatory Visit (HOSPITAL_COMMUNITY)
Admission: RE | Admit: 2021-04-24 | Discharge: 2021-04-24 | Disposition: A | Discharge status: Home / Self Care | Payer: 59 | Source: Ambulatory Visit | Attending: Cardiology | Admitting: Cardiology

## 2021-04-24 ENCOUNTER — Telehealth (HOSPITAL_COMMUNITY): Payer: Self-pay

## 2021-04-24 ENCOUNTER — Encounter (HOSPITAL_COMMUNITY)
Admission: RE | Admit: 2021-04-24 | Discharge: 2021-04-24 | Disposition: A | Discharge status: Home / Self Care | Payer: 59 | Source: Ambulatory Visit | Attending: Internal Medicine | Admitting: Internal Medicine

## 2021-04-24 ENCOUNTER — Telehealth (HOSPITAL_COMMUNITY): Payer: Self-pay | Admitting: *Deleted

## 2021-04-24 ENCOUNTER — Other Ambulatory Visit: Payer: Self-pay

## 2021-04-24 DIAGNOSIS — I479 Paroxysmal tachycardia, unspecified: Secondary | ICD-10-CM

## 2021-04-24 DIAGNOSIS — I5042 Chronic combined systolic (congestive) and diastolic (congestive) heart failure: Secondary | ICD-10-CM

## 2021-04-24 LAB — BASIC METABOLIC PANEL
Anion gap: 9 (ref 5–15)
BUN: 22 mg/dL — ABNORMAL HIGH (ref 6–20)
CO2: 26 mmol/L (ref 22–32)
Calcium: 9.1 mg/dL (ref 8.9–10.3)
Chloride: 100 mmol/L (ref 98–111)
Creatinine, Ser: 1.06 mg/dL — ABNORMAL HIGH (ref 0.44–1.00)
GFR, Estimated: >60 mL/min (ref >60)
Glucose, Bld: 125 mg/dL — ABNORMAL HIGH (ref 70–99)
Potassium: 3.5 mmol/L (ref 3.5–5.1)
Sodium: 135 mmol/L (ref 135–145)

## 2021-04-24 LAB — CBC
HCT: 49.7 % — ABNORMAL HIGH (ref 36.0–46.0)
Hemoglobin: 16.9 g/dL — ABNORMAL HIGH (ref 12.0–15.0)
MCH: 29 pg (ref 26.0–34.0)
MCHC: 34 g/dL (ref 30.0–36.0)
MCV: 85.4 fL (ref 80.0–100.0)
Platelets: 433 10*3/uL — ABNORMAL HIGH (ref 150–400)
RBC: 5.82 MIL/uL — ABNORMAL HIGH (ref 3.87–5.11)
RDW: 12.6 % (ref 11.5–15.5)
WBC: 15 10*3/uL — ABNORMAL HIGH (ref 4.0–10.5)
nRBC: 0 % (ref 0.0–0.2)

## 2021-04-24 LAB — BRAIN NATRIURETIC PEPTIDE: B Natriuretic Peptide: 109.7 pg/mL — ABNORMAL HIGH (ref 0.0–100.0)

## 2021-04-24 LAB — MAGNESIUM: Magnesium: 2.1 mg/dL (ref 1.7–2.4)

## 2021-04-24 LAB — DIGOXIN LEVEL: Digoxin Level: 0.4 ng/mL — ABNORMAL LOW (ref 0.8–2.0)

## 2021-04-24 NOTE — Telephone Encounter (Signed)
Per Lyda Jester, PA she was contacted by cardiac rehab regarding pt being tachycardic. She wants pt to come to office for labs, orders placed, pt aware

## 2021-04-24 NOTE — Telephone Encounter (Addendum)
Pt aware, agreeable, and verbalized understanding   ----- Message from Consuelo Pandy, PA-C sent at 04/24/2021  4:14 PM EST ----- Slight bump in SCr/BUN. May be a little dry. Hold tonight's dose of Lasix, then resume 60 mg bid tomorrow.   WBC also elevated. Ask if any dysuria, fever, chills or cough.

## 2021-04-24 NOTE — Progress Notes (Addendum)
Incomplete Session Note  Patient Details  Name: Christine [name] MRN: [id number] Date of Birth: Nov 09, 1965 Referring Provider:   Flowsheet Row CARDIAC REHAB PHASE II ORIENTATION from 03/23/2021 in [facility]  Referring Provider Christine Bickers, MD       Christine [name] did not complete her rehab session.  Javier returned to cardiac rehab resting heart 129-141. Questionable Sinus tach? Christine [name] says she can feel her heart beating denies other symptoms. Blood pressure 102/64. Oxygen saturation 97% on room air. Christine [name]'s weight today is 91.6 kg which is down 3.6 kg from 04/07/21. Medications reviewed. Christine [name] says her furosemide tablets were changed to 20 mg tablets 3 times a day. Christine [name] paged and notified. Christine [name] order a 12 lead ECG and came to cardiac rehab to talk to the patient. 12 lead ECG obtained and reviewed by Christine [name]. Christine [name] ordered lab work for Christine [name] at the  heart failure clinic. Patient to get blood work drawn after leaving cardiac rehab. Christine [name] said that Christine [name] may return to exercise as long as her resting heart rate is below 110. Patient states understanding and is agreeable to the plan. No complaints upon exit from cardiac rehab.Barnet Pall, RN,BSN 04/24/2021 2:42 PM

## 2021-04-26 ENCOUNTER — Other Ambulatory Visit: Payer: Self-pay

## 2021-04-26 ENCOUNTER — Other Ambulatory Visit (HOSPITAL_COMMUNITY): Payer: Self-pay

## 2021-04-26 ENCOUNTER — Encounter (HOSPITAL_COMMUNITY): Payer: 59

## 2021-04-26 MED ORDER — FUROSEMIDE 20 MG PO TABS: 60.0000 mg | ORAL_TABLET | Freq: Two times a day (BID) | ORAL | 1 refills | Status: DC

## 2021-04-26 MED FILL — Digoxin Tab 125 MCG (0.125 MG): ORAL | 30 days supply | Qty: 30 | Fill #2 | Status: AC

## 2021-04-26 NOTE — Telephone Encounter (Signed)
This is a CHF pt 

## 2021-04-27 ENCOUNTER — Other Ambulatory Visit (HOSPITAL_COMMUNITY): Payer: Self-pay

## 2021-04-27 MED FILL — Atorvastatin Calcium Tab 80 MG (Base Equivalent): ORAL | 30 days supply | Qty: 30 | Fill #2 | Status: AC

## 2021-04-28 ENCOUNTER — Encounter (HOSPITAL_COMMUNITY): Payer: 59

## 2021-05-01 ENCOUNTER — Encounter (HOSPITAL_COMMUNITY): Payer: 59

## 2021-05-01 NOTE — Addendum Note (Signed)
Encounter addended by: Lesly Rubenstein on: 05/01/2021 4:27 PM  Actions taken: Flowsheet accepted

## 2021-05-01 NOTE — Addendum Note (Signed)
Encounter addended by: Lesly Rubenstein on: 05/01/2021 4:33 PM  Actions taken: Flowsheet accepted

## 2021-05-02 ENCOUNTER — Encounter (HOSPITAL_COMMUNITY): Payer: Self-pay | Admitting: *Deleted

## 2021-05-02 DIAGNOSIS — I5042 Chronic combined systolic (congestive) and diastolic (congestive) heart failure: Secondary | ICD-10-CM

## 2021-05-02 NOTE — Progress Notes (Signed)
Cardiac Individual Treatment Plan  Patient Details  Name: Christine [name] MRN: [id number] Date of Birth: Jun 26, 1965 Referring Provider:   Flowsheet Row CARDIAC REHAB PHASE II ORIENTATION from 03/23/2021 in [place]  Referring Provider Glori Bickers, MD       Initial Encounter Date:  [place] PHASE II ORIENTATION from 03/23/2021 in [place]  Date 03/23/21       Visit Diagnosis: Chronic combined systolic and diastolic congestive heart failure ([employer])  Patient's Home Medications on Admission:  Current Outpatient Medications:    acetaminophen (TYLENOL) 500 MG tablet, Take 500-1,000 mg by mouth every 6 (six) hours as needed., Disp: , Rfl:    ALPRAZolam (XANAX) 1 MG tablet, Take 2 mg by mouth 3 (three) times daily., Disp: , Rfl:    apixaban (ELIQUIS) 5 MG TABS tablet, Take 1 tablet (5 mg total) by mouth 2 (two) times daily., Disp: 60 tablet, Rfl: 0   atorvastatin (LIPITOR) 80 MG tablet, Take 1 tablet (80 mg total) by mouth daily., Disp: 30 tablet, Rfl: 3   cyclobenzaprine (FLEXERIL) 10 MG tablet, Take 20 mg by mouth at bedtime., Disp: , Rfl:    dapagliflozin propanediol (FARXIGA) 10 MG TABS tablet, Take 1 tablet (10 mg total) by mouth daily., Disp: 30 tablet, Rfl: 3   digoxin (LANOXIN) 0.125 MG tablet, Take 1 tablet (0.125 mg total) by mouth daily., Disp: 30 tablet, Rfl: 3   furosemide (LASIX) 20 MG tablet, Take 3 tablets (60 mg total) by mouth 2 (two) times daily., Disp: 90 tablet, Rfl: 1   guaifenesin (HUMIBID E) 400 MG TABS tablet, Take 400-800 mg by mouth 3 (three) times daily as needed (congestion). (Patient not taking: Reported on 03/22/2021), Disp: , Rfl:    metoprolol succinate (TOPROL-XL) 25 MG 24 hr tablet, Take 0.5 tablets (12.5 mg total) by mouth daily. Take with or immediately following a meal., Disp: 45 tablet, Rfl: 3   nicotine (NICODERM CQ - DOSED IN MG/24 HOURS) 21 mg/24hr patch, Place 1  patch (21 mg total) onto the skin daily. (Patient not taking: Reported on 04/24/2021), Disp: 28 patch, Rfl: 0   oxymetazoline (AFRIN) 0.05 % nasal spray, Place 2 sprays into both nostrils 2 (two) times daily as needed for congestion., Disp: , Rfl:    potassium chloride SA (KLOR-CON M) 20 MEQ tablet, Take 2 tablets (40 mEq total) by mouth 2 (two) times daily. (Patient taking differently: Take 20 mEq by mouth in the morning, at noon, in the evening, and at bedtime.), Disp: 120 tablet, Rfl: 3   spironolactone (ALDACTONE) 25 MG tablet, Take 1 tablet (25 mg total) by mouth daily., Disp: 30 tablet, Rfl: 3   venlafaxine XR (EFFEXOR-XR) 75 MG 24 hr capsule, Take 75 mg by mouth in the morning, at noon, and at bedtime., Disp: , Rfl:    zolpidem (AMBIEN) 10 MG tablet, Take 5-10 mg by mouth at bedtime., Disp: , Rfl:   Past Medical History: Past Medical History:  Diagnosis Date   Anxiety 01/20/2021   CHF (congestive heart failure) (HCC)    Shortness of breath 01/20/2021   Tobacco abuse 01/20/2021    Tobacco Use: Social History   Tobacco Use  Smoking Status Former   Packs/day: 0.50   Types: Cigarettes   Quit date: 01/15/2021   Years since quitting: 0.2  Smokeless Tobacco Never    Labs: Recent Review Scientist, physiological     Labs for ITP Cardiac and Pulmonary Rehab Latest  Ref Rng & Units 01/23/2021 01/23/2021 01/23/2021 01/23/2021 01/24/2021   Cholestrol 0 - 200 mg/dL - - - - 216(H)   LDLCALC 0 - 99 mg/dL - - - - 137(H)   HDL >40 mg/dL - - - - 43   Trlycerides <150 mg/dL - - - - 179(H)   Hemoglobin A1c 4.8 - 5.6 % - - - - -   PHART 7.350 - 7.450 7.364 - - - -   PCO2ART 32.0 - 48.0 mmHg 40.1 - - - -   HCO3 20.0 - 28.0 mmol/L 22.9 24.7 25.7 22.7 -   TCO2 22 - 32 mmol/L 24 26 27 24  -   ACIDBASEDEF 0.0 - 2.0 mmol/L 2.0 1.0 - 3.0(H) -   O2SAT % 99.0 68.0 65.0 75.0 -       Capillary Blood Glucose: Lab Results  Component Value Date   GLUCAP 129 (H) 01/21/2021     Exercise Target  Goals: Exercise Program Goal: Individual exercise prescription set using results from initial 6 min walk test and THRR while considering  patients activity barriers and safety.   Exercise Prescription Goal: Starting with aerobic activity 30 plus minutes a day, 3 days per week for initial exercise prescription. Provide home exercise prescription and guidelines that participant acknowledges understanding prior to discharge.  Activity Barriers & Risk Stratification:  Activity Barriers & Cardiac Risk Stratification - 03/23/21 1459       Activity Barriers & Cardiac Risk Stratification   Activity Barriers Deconditioning;Decreased Ventricular Function;Balance Concerns    Cardiac Risk Stratification High             6 Minute Walk:  6 Minute Walk     Row Name 03/23/21 1352         6 Minute Walk   Phase Initial     Distance 1471 feet     Walk Time 6 minutes     # of Rest Breaks 0     MPH 2.79     METS 4.05     RPE 9     Perceived Dyspnea  11     VO2 Peak 14.19     Symptoms No     Resting HR 98 bpm     Resting BP 98/70     Resting Oxygen Saturation  97 %     Exercise Oxygen Saturation  during 6 min walk 98 %     Max Ex. HR 126 bpm     Max Ex. BP 104/68     2 Minute Post BP 96/68              Oxygen Initial Assessment:   Oxygen Re-Evaluation:   Oxygen Discharge (Final Oxygen Re-Evaluation):   Initial Exercise Prescription:  Initial Exercise Prescription - 03/23/21 1500       Date of Initial Exercise RX and Referring Provider   Date 03/23/21    Referring Provider Glori Bickers, MD    Expected Discharge Date 05/19/21      NuStep   Level 2    SPM 80    Minutes 30    METs 2.5      Prescription Details   Frequency (times per week) 3    Duration Progress to 30 minutes of continuous aerobic without signs/symptoms of physical distress      Intensity   THRR 40-80% of Max Heartrate 67-134    Ratings of Perceived Exertion 11-13    Perceived Dyspnea 0-4       Progression  Progression Continue progressive overload as per policy without signs/symptoms or physical distress.      Resistance Training   Training Prescription Yes    Weight 3 lbs    Reps 10-15             Perform Capillary Blood Glucose checks as needed.  Exercise Prescription Changes:   Exercise Prescription Changes     Row Name 03/29/21 1600             Response to Exercise   Blood Pressure (Admit) 96/61       Blood Pressure (Exercise) 110/70       Blood Pressure (Exit) 88/56  Rechk was 114/77       Heart Rate (Admit) 101 bpm       Heart Rate (Exercise) 115 bpm       Heart Rate (Exit) 100 bpm       Rating of Perceived Exertion (Exercise) 13       Symptoms None       Comments Pt's first day in the [facility] program       Duration Continue with 30 min of aerobic exercise without signs/symptoms of physical distress.       Intensity THRR unchanged         Progression   Progression Continue to progress workloads to maintain intensity without signs/symptoms of physical distress.       Average METs 1.9         Resistance Training   Training Prescription No       Weight No weights on wednesdays         Interval Training   Interval Training No         NuStep   Level 2       SPM 80       Minutes 30       METs 1.9                Exercise Comments:   Exercise Comments     Row Name 03/29/21 1628 04/28/21 1627         Exercise Comments Pt's first day in the [facility] program. Pt completed session today with no complaints. Pt is due for review of goals. Pt has only attened 3 sessions since 03/29/21. Pt has not attened since 04/07/2021. Will review goals when patient returns to the [place] [facility] [place] program.               Exercise Goals and Review:   Exercise Goals     Row Name 03/23/21 1500             Exercise Goals   Increase Physical Activity Yes       Intervention Provide advice, education, support and counseling about physical activity/exercise  needs.;Develop an individualized exercise prescription for aerobic and resistive training based on initial evaluation findings, risk stratification, comorbidities and participant's personal goals.       Expected Outcomes Short Term: Attend rehab on a regular basis to increase amount of physical activity.;Long Term: Add in home exercise to make exercise part of routine and to increase amount of physical activity.;Long Term: Exercising regularly at least 3-5 days a week.       Increase Strength and Stamina Yes       Intervention Provide advice, education, support and counseling about physical activity/exercise needs.;Develop an individualized exercise prescription for aerobic and resistive training based on initial evaluation findings, risk stratification, comorbidities and participant's personal goals.  Expected Outcomes Long Term: Improve cardiorespiratory fitness, muscular endurance and strength as measured by increased METs and functional capacity (6MWT);Short Term: Perform resistance training exercises routinely during rehab and add in resistance training at home;Short Term: Increase workloads from initial exercise prescription for resistance, speed, and METs.       Able to understand and use rate of perceived exertion (RPE) scale Yes       Intervention Provide education and explanation on how to use RPE scale       Expected Outcomes Short Term: Able to use RPE daily in rehab to express subjective intensity level;Long Term:  Able to use RPE to guide intensity level when exercising independently       Knowledge and understanding of Target Heart Rate Range (THRR) Yes       Intervention Provide education and explanation of THRR including how the numbers were predicted and where they are located for reference       Expected Outcomes Short Term: Able to state/look up THRR;Long Term: Able to use THRR to govern intensity when exercising independently;Short Term: Able to use daily as guideline for  intensity in rehab       Understanding of Exercise Prescription Yes       Intervention Provide education, explanation, and written materials on patient's individual exercise prescription       Expected Outcomes Short Term: Able to explain program exercise prescription;Long Term: Able to explain home exercise prescription to exercise independently                Exercise Goals Re-Evaluation :  Exercise Goals Re-Evaluation     Row Name 03/29/21 1626             Exercise Goal Re-Evaluation   Exercise Goals Review Increase Physical Activity;Increase Strength and Stamina;Able to understand and use rate of perceived exertion (RPE) scale;Knowledge and understanding of Target Heart Rate Range (THRR);Understanding of Exercise Prescription       Comments Pt's first day in the [facility] program. Pt understnads the exercise Rx, THRR and RPE scale.       Expected Outcomes Will continue to montior patient and progress exercise workloads as tolerated.                 Discharge Exercise Prescription (Final Exercise Prescription Changes):  Exercise Prescription Changes - 03/29/21 1600       Response to Exercise   Blood Pressure (Admit) 96/61    Blood Pressure (Exercise) 110/70    Blood Pressure (Exit) 88/56   Rechk was 114/77   Heart Rate (Admit) 101 bpm    Heart Rate (Exercise) 115 bpm    Heart Rate (Exit) 100 bpm    Rating of Perceived Exertion (Exercise) 13    Symptoms None    Comments Pt's first day in the [facility] program    Duration Continue with 30 min of aerobic exercise without signs/symptoms of physical distress.    Intensity THRR unchanged      Progression   Progression Continue to progress workloads to maintain intensity without signs/symptoms of physical distress.    Average METs 1.9      Resistance Training   Training Prescription No    Weight No weights on wednesdays      Interval Training   Interval Training No      NuStep   Level 2    SPM 80    Minutes 30     METs 1.9  Nutrition:  Target Goals: Understanding of nutrition guidelines, daily intake of sodium <1518m, cholesterol <2084m calories 30% from fat and 7% or less from saturated fats, daily to have 5 or more servings of fruits and vegetables.  Biometrics:  Pre Biometrics - 03/23/21 1308       Pre Biometrics   Waist Circumference 41 inches    Hip Circumference 51 inches    Waist to Hip Ratio 0.8 %    Triceps Skinfold 25 mm    % Body Fat 39.4 %    Grip Strength 18 kg    Flexibility 21.25 in    Single Leg Stand 21.25 seconds              Nutrition Therapy Plan and Nutrition Goals:   Nutrition Assessments:  MEDIFICTS Score Key: ?70 Need to make dietary changes  40-70 Heart Healthy Diet ? 40 Therapeutic Level Cholesterol Diet   Picture Your Plate Scores: <4<65nhealthy dietary pattern with much room for improvement. 41-50 Dietary pattern unlikely to meet recommendations for good health and room for improvement. 51-60 More healthful dietary pattern, with some room for improvement.  >60 Healthy dietary pattern, although there may be some specific behaviors that could be improved.    Nutrition Goals Re-Evaluation:   Nutrition Goals Discharge (Final Nutrition Goals Re-Evaluation):   Psychosocial: Target Goals: Acknowledge presence or absence of significant depression and/or stress, maximize coping skills, provide positive support system. Participant is able to verbalize types and ability to use techniques and skills needed for reducing stress and depression.  Initial Review & Psychosocial Screening:  Initial Psych Review & Screening - 03/23/21 1657       Initial Review   Current issues with History of Depression      Family Dynamics   Good Support System? Yes   [name] lives with her sister and has a brother who lives nearby     Barriers   Psychosocial barriers to participate in program The patient should benefit from training in stress management  and relaxation.      Screening Interventions   Interventions Encouraged to exercise;To provide support and resources with identified psychosocial needs    Expected Outcomes Long Term Goal: Stressors or current issues are controlled or eliminated.;Short Term goal: Identification and review with participant of any Quality of Life or Depression concerns found by scoring the questionnaire.;Long Term goal: The participant improves quality of Life and PHQ9 Scores as seen by post scores and/or verbalization of changes             Quality of Life Scores:  Quality of Life - 03/23/21 1450       Quality of Life   Select Quality of Life      Quality of Life Scores   Health/Function Pre 14.27 %    Socioeconomic Pre 18.38 %    Psych/Spiritual Pre 20.79 %    Family Pre 19.88 %    GLOBAL Pre 17.24 %            Scores of 19 and below usually indicate a poorer quality of life in these areas.  A difference of  2-3 points is a clinically meaningful difference.  A difference of 2-3 points in the total score of the Quality of Life Index has been associated with significant improvement in overall quality of life, self-image, physical symptoms, and general health in studies assessing change in quality of life.  PHQ-9: Recent Review Flowsheet Data     [facility] 03/23/2021  02/06/2021   Decreased Interest 0 0   Down, Depressed, Hopeless 0 1   PHQ - 2 Score 0 1      Interpretation of Total Score  Total Score Depression Severity:  1-4 = Minimal depression, 5-9 = Mild depression, 10-14 = Moderate depression, 15-19 = Moderately severe depression, 20-27 = Severe depression   Psychosocial Evaluation and Intervention:   Psychosocial Re-Evaluation:  Psychosocial Re-Evaluation     [place] Name 04/04/21 1531 05/02/21 1557           Psychosocial Re-Evaluation   Current issues with History of Depression;Current Stress Concerns History of Depression;Current Stress Concerns       Comments Reviewed quality of life questionnaire with the patient. Ambrielle denies being depressed. Carolyna has some financial stressors as she had to cut back her hours due to her recent hospitalization Merridith has not participated in cardiac rehab since 04/07/21. Unable to assess.      Expected Outcomes [place] will have decreased stressors upon the completion of phase 2 cardiac rehab Aneliz will have decreased stressors upon the completion of phase 2 cardiac rehab      Interventions Encouraged to attend Cardiac Rehabilitation for the exercise;Stress management education Encouraged to attend Cardiac Rehabilitation for the exercise;Stress management education      Continue Psychosocial Services  Follow up required by staff Follow up required by staff        Initial Review   Source of Stress Concerns Chronic Illness;Financial Chronic Illness;Financial      Comments Will continue to monitor and offer support as needed Will continue to monitor and offer support as needed               Psychosocial Discharge (Final Psychosocial Re-Evaluation):  Psychosocial Re-Evaluation - 05/02/21 1557       Psychosocial Re-Evaluation   Current issues with History of Depression;Current Stress Concerns    Comments Bayli has not participated in cardiac rehab since 04/07/21. Unable to assess.    Expected Outcomes [place] will have decreased stressors upon the completion of phase 2 cardiac rehab    Interventions Encouraged to attend Cardiac Rehabilitation for the exercise;Stress management education    Continue Psychosocial Services  Follow up required by staff      Initial Review   Source of Stress Concerns Chronic Illness;Financial    Comments Will continue to monitor and offer support as needed             Vocational Rehabilitation: Provide vocational rehab assistance to qualifying candidates.   Vocational Rehab Evaluation & Intervention:  Vocational Rehab - 03/23/21 1659       Initial Vocational Rehab Evaluation  & Intervention   Assessment shows need for Vocational Rehabilitation No   Iqra works part time and does not need vocational rehab at this time.            Education: Education Goals: Education classes will be provided on a weekly basis, covering required topics. Participant will state understanding/return demonstration of topics presented.  Learning Barriers/Preferences:  Learning Barriers/Preferences - 03/23/21 1457       Learning Barriers/Preferences   Learning Barriers None    Learning Preferences Audio;Verbal Instruction             Education Topics: Hypertension, Hypertension Reduction -Define heart disease and high blood pressure. Discus how high blood pressure affects the body and ways to reduce high blood pressure.   Exercise and Your Heart -Discuss why it is important to exercise, the FITT principles of exercise,  normal and abnormal responses to exercise, and how to exercise safely.   Angina -Discuss definition of angina, causes of angina, treatment of angina, and how to decrease risk of having angina.   Cardiac Medications -Review what the following cardiac medications are used for, how they affect the body, and side effects that may occur when taking the medications.  Medications include Aspirin, Beta blockers, calcium channel blockers, ACE Inhibitors, angiotensin receptor blockers, diuretics, digoxin, and antihyperlipidemics.   Congestive Heart Failure -Discuss the definition of CHF, how to live with CHF, the signs and symptoms of CHF, and how keep track of weight and sodium intake.   Heart Disease and Intimacy -Discus the effect sexual activity has on the heart, how changes occur during intimacy as we age, and safety during sexual activity.   Smoking Cessation / COPD -Discuss different methods to quit smoking, the health benefits of quitting smoking, and the definition of COPD.   Nutrition I: Fats -Discuss the types of cholesterol, what cholesterol  does to the heart, and how cholesterol levels can be controlled.   Nutrition II: Labels -Discuss the different components of food labels and how to read food label   Heart Parts/Heart Disease and PAD -Discuss the anatomy of the heart, the pathway of blood circulation through the heart, and these are affected by heart disease.   Stress I: Signs and Symptoms -Discuss the causes of stress, how stress may lead to anxiety and depression, and ways to limit stress.   Stress II: Relaxation -Discuss different types of relaxation techniques to limit stress.   Warning Signs of Stroke / TIA -Discuss definition of a stroke, what the signs and symptoms are of a stroke, and how to identify when someone is having stroke.   Knowledge Questionnaire Score:  Knowledge Questionnaire Score - 03/23/21 1451       Knowledge Questionnaire Score   Pre Score 26/28             Core Components/Risk Factors/Patient Goals at Admission:  Personal Goals and Risk Factors at Admission - 03/23/21 1451       Core Components/Risk Factors/Patient Goals on Admission    Weight Management Yes;Weight Loss    Intervention Weight Management: Develop a combined nutrition and exercise program designed to reach desired caloric intake, while maintaining appropriate intake of nutrient and fiber, sodium and fats, and appropriate energy expenditure required for the weight goal.;Weight Management: Provide education and appropriate resources to help participant work on and attain dietary goals.;Weight Management/Obesity: Establish reasonable short term and long term weight goals.    Admit Weight 208 lb 12.4 oz (94.7 kg)    Expected Outcomes Short Term: Continue to assess and modify interventions until short term weight is achieved;Long Term: Adherence to nutrition and physical activity/exercise program aimed toward attainment of established weight goal;Weight Maintenance: Understanding of the daily nutrition guidelines, which  includes 25-35% calories from fat, 7% or less cal from saturated fats, less than 256m cholesterol, less than 1.5gm of sodium, & 5 or more servings of fruits and vegetables daily;Weight Loss: Understanding of general recommendations for a balanced deficit meal plan, which promotes 1-2 lb weight loss per week and includes a negative energy balance of [phone] kcal/d;Understanding recommendations for meals to include 15-35% energy as protein, 25-35% energy from fat, 35-60% energy from carbohydrates, less than 2034mof dietary cholesterol, 20-35 gm of total fiber daily;Understanding of distribution of calorie intake throughout the day with the consumption of 4-5 meals/snacks    Heart Failure Yes  Intervention Provide a combined exercise and nutrition program that is supplemented with education, support and counseling about heart failure. Directed toward relieving symptoms such as shortness of breath, decreased exercise tolerance, and extremity edema.    Expected Outcomes Long term: Adoption of self-care skills and reduction of barriers for early signs and symptoms recognition and intervention leading to self-care maintenance.;Short term: Daily weights obtained and reported for increase. Utilizing diuretic protocols set by physician.;Short term: Attendance in program 2-3 days a week with increased exercise capacity. Reported lower sodium intake. Reported increased fruit and vegetable intake. Reports medication compliance.;Improve functional capacity of life    Hypertension --    Intervention --    Expected Outcomes --    Lipids Yes    Intervention Provide education and support for participant on nutrition & aerobic/resistive exercise along with prescribed medications to achieve LDL <2m, HDL >441m    Expected Outcomes Short Term: Participant states understanding of desired cholesterol values and is compliant with medications prescribed. Participant is following exercise prescription and nutrition  guidelines.;Long Term: Cholesterol controlled with medications as prescribed, with individualized exercise RX and with personalized nutrition plan. Value goals: LDL < 702mHDL > 40 mg.    Stress Yes    Intervention Offer individual and/or small group education and counseling on adjustment to heart disease, stress management and health-related lifestyle change. Teach and support self-help strategies.;Refer participants experiencing significant psychosocial distress to appropriate mental health specialists for further evaluation and treatment. When possible, include family members and significant others in education/counseling sessions.    Expected Outcomes Short Term: Participant demonstrates changes in health-related behavior, relaxation and other stress management skills, ability to obtain effective social support, and compliance with psychotropic medications if prescribed.;Long Term: Emotional wellbeing is indicated by absence of clinically significant psychosocial distress or social isolation.             Core Components/Risk Factors/Patient Goals Review:   Goals and Risk Factor Review     Row Name 04/04/21 1536 05/02/21 1558           Core Components/Risk Factors/Patient Goals Review   Personal Goals Review Weight Management/Obesity;Lipids;Diabetes;Stress Weight Management/Obesity;Lipids;Diabetes;Stress      Review BetEustaquio Maize off to a good start to exercise at cardiac rehab. Systolic BP's have been in the 90's. BetEustaquio Maizes been asymptomatic. [name] was noted to be tacycardic on 04/921. [name] has not returned to exercise yet. Last day of exercise was on 04/07/21.      Expected Outcomes [name] will contiue to participate in phase 2 cardiac rehab for exercise, nutrition and life style modifications. [name] will contiue to participate in phase 2 cardiac rehab for exercise, nutrition and life style modifications.               Core Components/Risk Factors/Patient Goals at Discharge (Final Review):    Goals and Risk Factor Review - 05/02/21 1558       Core Components/Risk Factors/Patient Goals Review   Personal Goals Review Weight Management/Obesity;Lipids;Diabetes;Stress    Review BetEustaquio Maizes noted to be tacycardic on 04/921. [name] has not returned to exercise yet. Last day of exercise was on 04/07/21.    Expected Outcomes [name] will contiue to participate in phase 2 cardiac rehab for exercise, nutrition and life style modifications.             ITP Comments:  ITP Comments     Row Name 03/23/21 1332 04/04/21 1527 05/02/21 1600       ITP Comments Dr TraFransico Him,  Medical Director 30 Day ITP Review. [name] is off to a good start to exercise at cardiac rehab. 30 Day ITP Review. [name] has only attended 3 exercise sessions since starting CR. [name] has been out due to her work schedule and recent tachycardia.              Comments: See ITP comments.Barnet Pall, RN,BSN 05/02/2021 4:02 PM

## 2021-05-03 ENCOUNTER — Encounter (HOSPITAL_COMMUNITY): Payer: 59

## 2021-05-05 ENCOUNTER — Telehealth (HOSPITAL_COMMUNITY): Payer: Self-pay | Admitting: *Deleted

## 2021-05-05 ENCOUNTER — Encounter (HOSPITAL_COMMUNITY): Payer: 59

## 2021-05-05 DIAGNOSIS — G4733 Obstructive sleep apnea (adult) (pediatric): Secondary | ICD-10-CM | POA: Insufficient documentation

## 2021-05-05 NOTE — Telephone Encounter (Signed)
Spoke with Stanton Kidney she has cold like symptoms. Carissa says the her heart rate between 94-105. Amunique hopes to return to exercise today.Harrell Gave RN BSN

## 2021-05-08 ENCOUNTER — Other Ambulatory Visit: Payer: Self-pay

## 2021-05-08 ENCOUNTER — Encounter (HOSPITAL_COMMUNITY)
Admission: RE | Admit: 2021-05-08 | Discharge: 2021-05-08 | Disposition: A | Discharge status: Home / Self Care | Payer: 59 | Source: Ambulatory Visit | Attending: Internal Medicine | Admitting: Internal Medicine

## 2021-05-08 DIAGNOSIS — I5042 Chronic combined systolic (congestive) and diastolic (congestive) heart failure: Secondary | ICD-10-CM

## 2021-05-09 ENCOUNTER — Encounter: Payer: Self-pay | Admitting: Internal Medicine

## 2021-05-09 ENCOUNTER — Ambulatory Visit (HOSPITAL_BASED_OUTPATIENT_CLINIC_OR_DEPARTMENT_OTHER): Payer: 59 | Admitting: Internal Medicine

## 2021-05-09 ENCOUNTER — Other Ambulatory Visit (HOSPITAL_COMMUNITY): Payer: Self-pay

## 2021-05-09 VITALS — Wt 197.0 lb

## 2021-05-09 DIAGNOSIS — E663 Overweight: Secondary | ICD-10-CM

## 2021-05-09 DIAGNOSIS — D72829 Elevated white blood cell count, unspecified: Secondary | ICD-10-CM

## 2021-05-09 DIAGNOSIS — I5020 Unspecified systolic (congestive) heart failure: Secondary | ICD-10-CM

## 2021-05-09 DIAGNOSIS — L719 Rosacea, unspecified: Secondary | ICD-10-CM | POA: Diagnosis not present

## 2021-05-09 DIAGNOSIS — Z1231 Encounter for screening mammogram for malignant neoplasm of breast: Secondary | ICD-10-CM

## 2021-05-09 DIAGNOSIS — D751 Secondary polycythemia: Secondary | ICD-10-CM

## 2021-05-09 MED ORDER — METRONIDAZOLE 1 % EX GEL
1.0000 "application " | Freq: Every day | CUTANEOUS | 0 refills | Status: DC
  Filled 2021-05-09: qty 60, 30d supply, fill #0

## 2021-05-09 NOTE — Progress Notes (Signed)
Christine [name] returned to cardiac rehab and exercised without difficulties or complaints.Harrell Gave RN BSN

## 2021-05-09 NOTE — Progress Notes (Signed)
Virtual Visit via Video Note  I connected with Christine [name] on 05/09/2021 at 1:34 PM by a video enabled telemedicine application and verified that I am speaking with the correct person using two identifiers.  Location: Patient: home Provider: Office   I discussed the limitations of evaluation and management by telemedicine and the availability of in person appointments. The patient expressed understanding and agreed to proceed.  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  Purpose of today's visit is chronic disease management.  Systolic CHF/left ventricular apical thrombus: Going to cardiac rehab 3 times a week.  Last week she was having issues with increased heart rate during rehab.  She went back yesterday and everything was fine.  She now has a fit bit that tracks her pulse.  Pulse rate is between 90-95 most of the times.  Endorses palpitations sometimes especially in the mornings.  She saw cardiology PA last month.  Carvedilol changed to Toprol.  She denies any shortness of breath or chest pains.  No lower extremity edema.  Weight is now at 197 pounds.  On last visit she was 214 pounds.  She has changed her eating habits completely.  She now cooks most of her food instead of eating out. -She has not had any bruising or bleeding on Eliquis. -Recent CBC done by cardiology PA revealed elevated WBC, hematocrit and platelet counts.  Patient states that her sister told her that she snores but she does not have any morning headaches or daytime sleepiness.  She wakes in the mornings feeling refreshed.  She reports history of rosacea.  She has started having a flareup.  She is requesting a prescription for MetroGel.  Referred for mammogram on last visit.  Patient has not had it done because [facility] imaging does not  accept her insurance. Outpatient Encounter Medications as of 05/09/2021  Medication Sig   acetaminophen (TYLENOL) 500 MG tablet Take 500-1,000 mg by mouth every 6 (six) hours as needed.   ALPRAZolam (XANAX) 1 MG tablet Take 2 mg by mouth 3 (three) times daily.   apixaban (ELIQUIS) 5 MG TABS tablet Take 1 tablet (5 mg total) by mouth 2 (two) times daily.   atorvastatin (LIPITOR) 80 MG tablet Take 1 tablet (80 mg total) by mouth daily.   cyclobenzaprine (FLEXERIL) 10 MG tablet Take 20 mg by mouth at bedtime.   dapagliflozin propanediol (FARXIGA) 10 MG TABS tablet Take 1 tablet (10 mg total) by mouth daily.   digoxin (LANOXIN) 0.125 MG tablet Take 1 tablet (0.125 mg total) by mouth daily.   furosemide (LASIX) 20 MG tablet Take 3 tablets (60 mg total) by mouth 2 (two) times daily.   guaifenesin (HUMIBID E) 400 MG TABS tablet Take 400-800 mg by mouth 3 (three) times daily as needed (congestion). (Patient not taking: Reported on 03/22/2021)   metoprolol succinate (TOPROL-XL) 25 MG 24 hr tablet Take 0.5 tablets (12.5 mg total) by mouth daily. Take with or immediately following a meal.   nicotine (NICODERM CQ - DOSED IN MG/24 HOURS) 21 mg/24hr patch Place 1 patch (21 mg total) onto the skin daily. (Patient not taking: Reported on 04/24/2021)   oxymetazoline (AFRIN) 0.05 % nasal spray Place 2 sprays into both nostrils 2 (two) times daily as needed for congestion.   potassium chloride SA (KLOR-CON M) 20 MEQ tablet Take 2 tablets (  40 mEq total) by mouth 2 (two) times daily. (Patient taking differently: Take 20 mEq by mouth in the morning, at noon, in the evening, and at bedtime.)   spironolactone (ALDACTONE) 25 MG tablet Take 1 tablet (25 mg total) by mouth daily.   venlafaxine XR (EFFEXOR-XR) 75 MG 24 hr capsule Take 75 mg by mouth in the morning, at noon, and at bedtime.   zolpidem (AMBIEN) 10 MG tablet Take 5-10 mg by mouth at bedtime.   No facility-administered encounter medications on file as of 05/09/2021.       Observations/Objective: 39 Caucasian female in NAD.    BMI is now at 27. Assessment and Plan: 1. 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, NYHA class 2 (HCC) Stable.  She will continue therapy including digoxin, Toprol, spironolactone, furosemide.  She will keep her upcoming appointment with Dr. Haroldine Laws next week.  She will let him know about the palpitations that she has been having.  2. Over weight Commended her on changing her eating habits.  She is moving more doing cardiac rehab.    3. Rosacea Prescription sent for MetroGel to her pharmacy.  4. Leukocytosis, unspecified type We will recheck CBC with differential. - CBC With Differential; Future  5. Polycythemia  - CBC With Differential; Future  6. Encounter for screening mammogram for malignant neoplasm of breast We will send the referral for the mammogram to [facility] mammography. - MM Digital Screening; Future   Follow Up Instructions: 4 mths   I discussed the assessment and treatment plan with the patient. The patient was provided an opportunity to ask questions and all were answered. The patient agreed with the plan and demonstrated an understanding of the instructions.   The patient was advised to call back or seek an in-person evaluation if the symptoms worsen or if the condition fails to improve as anticipated.  I spent 18 minutes dedicated to the care of this patient on the date of this encounter to include previsit review of chart including recent cardiology note, face-to-face time with patient discussing diagnosis and management and post visit ordering of tests and medication.  This note has been created with Surveyor, quantity. Any transcriptional errors are unintentional.  Karle Plumber, MD

## 2021-05-10 ENCOUNTER — Encounter (HOSPITAL_COMMUNITY): Payer: 59

## 2021-05-11 ENCOUNTER — Telehealth (HOSPITAL_COMMUNITY): Payer: Self-pay | Admitting: Pharmacy Technician

## 2021-05-11 ENCOUNTER — Other Ambulatory Visit (HOSPITAL_COMMUNITY): Payer: Self-pay

## 2021-05-11 ENCOUNTER — Encounter (HOSPITAL_COMMUNITY): Payer: Self-pay | Admitting: Internal Medicine

## 2021-05-11 ENCOUNTER — Ambulatory Visit (HOSPITAL_BASED_OUTPATIENT_CLINIC_OR_DEPARTMENT_OTHER)
Admission: RE | Admit: 2021-05-11 | Discharge: 2021-05-11 | Disposition: A | Discharge status: Home / Self Care | Payer: 59 | Source: Ambulatory Visit | Attending: Internal Medicine | Admitting: Internal Medicine

## 2021-05-11 ENCOUNTER — Other Ambulatory Visit: Payer: Self-pay

## 2021-05-11 ENCOUNTER — Ambulatory Visit (HOSPITAL_COMMUNITY)
Admission: RE | Admit: 2021-05-11 | Discharge: 2021-05-11 | Disposition: A | Discharge status: Home / Self Care | Payer: 59 | Source: Ambulatory Visit | Attending: Internal Medicine | Admitting: Internal Medicine

## 2021-05-11 VITALS — BP 98/68 | HR 111 | Wt 203.0 lb

## 2021-05-11 DIAGNOSIS — I5042 Chronic combined systolic (congestive) and diastolic (congestive) heart failure: Secondary | ICD-10-CM | POA: Diagnosis not present

## 2021-05-11 DIAGNOSIS — I2510 Atherosclerotic heart disease of native coronary artery without angina pectoris: Secondary | ICD-10-CM | POA: Diagnosis not present

## 2021-05-11 DIAGNOSIS — I5022 Chronic systolic (congestive) heart failure: Secondary | ICD-10-CM | POA: Diagnosis not present

## 2021-05-11 DIAGNOSIS — I340 Nonrheumatic mitral (valve) insufficiency: Secondary | ICD-10-CM | POA: Diagnosis not present

## 2021-05-11 LAB — CBC
HCT: 48.7 % — ABNORMAL HIGH (ref 36.0–46.0)
Hemoglobin: 16.2 g/dL — ABNORMAL HIGH (ref 12.0–15.0)
MCH: 28.6 pg (ref 26.0–34.0)
MCHC: 33.3 g/dL (ref 30.0–36.0)
MCV: 85.9 fL (ref 80.0–100.0)
Platelets: 450 10*3/uL — ABNORMAL HIGH (ref 150–400)
RBC: 5.67 MIL/uL — ABNORMAL HIGH (ref 3.87–5.11)
RDW: 12.9 % (ref 11.5–15.5)
WBC: 13.9 10*3/uL — ABNORMAL HIGH (ref 4.0–10.5)
nRBC: 0 % (ref 0.0–0.2)

## 2021-05-11 LAB — BASIC METABOLIC PANEL
Anion gap: 11 (ref 5–15)
BUN: 21 mg/dL — ABNORMAL HIGH (ref 6–20)
CO2: 26 mmol/L (ref 22–32)
Calcium: 9.4 mg/dL (ref 8.9–10.3)
Chloride: 99 mmol/L (ref 98–111)
Creatinine, Ser: 0.87 mg/dL (ref 0.44–1.00)
GFR, Estimated: >60 mL/min (ref >60)
Glucose, Bld: 132 mg/dL — ABNORMAL HIGH (ref 70–99)
Potassium: 3.5 mmol/L (ref 3.5–5.1)
Sodium: 136 mmol/L (ref 135–145)

## 2021-05-11 LAB — TSH: TSH: 4.026 u[IU]/mL (ref 0.350–4.500)

## 2021-05-11 LAB — ECHOCARDIOGRAM COMPLETE
Calc EF: 28.6 %
S' Lateral: 6.1 cm
Single Plane A2C EF: 17.9 %
Single Plane A4C EF: 36.8 %

## 2021-05-11 LAB — BRAIN NATRIURETIC PEPTIDE: B Natriuretic Peptide: 126.7 pg/mL — ABNORMAL HIGH (ref 0.0–100.0)

## 2021-05-11 MED ORDER — PERFLUTREN LIPID MICROSPHERE
1.0000 mL | INTRAVENOUS | Status: DC | PRN
  Administered 2021-05-11: 4 mL via INTRAVENOUS
  Filled 2021-05-11: qty 10

## 2021-05-11 MED ORDER — IVABRADINE HCL 5 MG PO TABS
5.0000 mg | ORAL_TABLET | Freq: Two times a day (BID) | ORAL | 6 refills | Status: DC
Start: 2021-05-11 — End: 2021-12-04
  Filled 2021-05-11 – 2021-05-12 (×2): qty 60, 30d supply, fill #0
  Filled 2021-06-09: qty 60, 30d supply, fill #1
  Filled 2021-07-06: qty 60, 30d supply, fill #2
  Filled 2021-08-07: qty 60, 30d supply, fill #3
  Filled 2021-08-31: qty 60, 30d supply, fill #4
  Filled 2021-09-29: qty 60, 30d supply, fill #5
  Filled 2021-11-04: qty 60, 30d supply, fill #6

## 2021-05-11 MED ORDER — METOPROLOL SUCCINATE ER 25 MG PO TB24
12.5000 mg | ORAL_TABLET | Freq: Every day | ORAL | 3 refills | Status: DC
  Filled 2021-05-11 (×3): qty 45, 90d supply, fill #0

## 2021-05-11 NOTE — Patient Instructions (Signed)
Medication Changes:  Start Corlanor 5 mg Twice daily   Lab Work:  Labs done today, your results will be available in MyChart, we will contact you for abnormal readings.  Testing/Procedures:  Your physician has recommended that you have a cardiopulmonary stress test (CPX). CPX testing is a non-invasive measurement of heart and lung function. It replaces a traditional treadmill stress test. This type of test provides a tremendous amount of information that relates not only to your present condition but also for future outcomes. This test combines measurements of you ventilation, respiratory gas exchange in the lungs, electrocardiogram (EKG), blood pressure and physical response before, during, and following an exercise protocol.  Referrals:  None  Special Instructions // Education:  Do the following things EVERYDAY: Weigh yourself in the morning before breakfast. Write it down and keep it in a log. Take your medicines as prescribed Eat low salt foods--Limit salt (sodium) to 2000 mg per day.  Stay as active as you can everyday Limit all fluids for the day to less than 2 liters   Follow-Up in: 6 weeks  At the [facility], you and your health needs are our priority. We have a designated team specialized in the treatment of Heart Failure. This Care Team includes your primary Heart Failure Specialized Cardiologist (physician), Advanced Practice Providers (APPs- Physician Assistants and Nurse Practitioners), and Pharmacist who all work together to provide you with the care you need, when you need it.   You may see any of the following providers on your designated Care Team at your next follow up:  Dr Glori Bickers Dr Haynes Kerns, NP Lyda Jester, [place] [facility] [place], [place] Audry Riles, PharmD   Please be sure to bring in all your medications bottles to every appointment.   Need to Contact us:  If you have any questions or  concerns before your next appointment please send [place] a message through [place] or call our office at [phone].    TO LEAVE A MESSAGE FOR THE NURSE SELECT OPTION 2, PLEASE LEAVE A MESSAGE INCLUDING: YOUR NAME DATE OF BIRTH CALL BACK NUMBER REASON FOR CALL**this is important as we prioritize the call backs  YOU WILL RECEIVE A CALL BACK THE SAME DAY AS LONG AS YOU CALL BEFORE 4:00 PM

## 2021-05-11 NOTE — Addendum Note (Signed)
Encounter addended by: Scarlette Calico, RN on: 05/11/2021 3:32 PM  Actions taken: Diagnosis association updated, Clinical Note Signed, Charge Capture section accepted, Order list changed

## 2021-05-11 NOTE — Telephone Encounter (Signed)
Patient Advocate Encounter   Received notification from [employer] that prior authorization for Corlanor is required.   PA submitted on CoverMyMeds Key [id number]) Status is pending   Will continue to follow.

## 2021-05-11 NOTE — Progress Notes (Signed)
ADVANCED HEART FAILURE CLINIC NOTE   PCP: [facility]: Dr Oval Linsey HF Cardiologist: Dr Haroldine Laws  HPI: Christine [name] is a 56 y.o.female with history of chronic tobacco use, ADHD, anxiety, depression, CAD,  systolic HF   Presented to ED 01/20/21 with increased shortness of breath/tachycardia. Adderrall stopped. Echo with EF 20-25%,  LHC/RHC with single vessel CAD (occluded mLAD) elevated filling pressures and  moderately reduced CO. Digoxin added. Beta blocker stopped.  Discharged to home 01/25/21. Discharge weight 213 pounds.   Today she returns for HF follow up. Says she is getting around fine.Doing 30 mins on Nu-Step. No problems going to the store. Denies SOB, orthopnea or CP. HR remains high.  Able to go to CR but last week wasn't able to exercise due to high HR. Working occasionally fixing computers,   - Echo today 05/11/21 EF < 20% LV severely dilated RV ok Mild MR  Personally reviewed  Cardiac Testing  - Echo 01/21/2021 EF 20-25% LV severely dilated, LV apical mural thrombus, RV okay, moderate MR.  - R/LHC w/ severe 1v CAD with occlusion of mid LAD., PCW 27, CO 4.7, CI 2.2    - cMRI demonstrated subendocardial LGE consistent with prior infarcts in LV basal inferolateral wall, apical anterior/septal/inferior walls and apex. LVEF 22%  ROS: All systems negative except as listed in HPI, PMH and Problem List.  SH:  Social History   Socioeconomic History   Marital status: Single    Spouse name: Not on file   Number of children: Not on file   Years of education: 16   Highest education level: Not on file  Occupational History   Not on file  Tobacco Use   Smoking status: Former    Packs/day: 0.50    Types: Cigarettes    Quit date: 01/15/2021    Years since quitting: 0.3   Smokeless tobacco: Never  Vaping Use   Vaping Use: Never used  Substance and Sexual Activity   Alcohol use: Yes    Comment: rarely   Drug use: Never   Sexual activity: Not  on file  Other Topics Concern   Not on file  Social History Narrative   Not on file   Social Determinants of Health   Financial Resource Strain: Medium Risk   Difficulty of Paying Living Expenses: Somewhat hard  Food Insecurity: No Food Insecurity   Worried About Charity fundraiser in the Last Year: Never true   Ran Out of Food in the Last Year: Never true  Transportation Needs: No Transportation Needs   Lack of Transportation (Medical): No   Lack of Transportation (Non-Medical): No  Physical Activity: Not on file  Stress: Not on file  Social Connections: Not on file  Intimate Partner Violence: Not on file   FH:  Family History  Problem Relation Age of Onset   Stroke Mother    Atrial fibrillation Mother    Heart failure Father    Past Medical History:  Diagnosis Date   Anxiety 01/20/2021   CHF (congestive heart failure) (HCC)    Shortness of breath 01/20/2021   Tobacco abuse 01/20/2021   Current Outpatient Medications  Medication Sig Dispense Refill   acetaminophen (TYLENOL) 500 MG tablet Take 500-1,000 mg by mouth every 6 (six) hours as needed.     ALPRAZolam (XANAX) 1 MG tablet Take 2 mg by mouth 3 (three) times daily.     apixaban (ELIQUIS) 5 MG TABS tablet Take 1 tablet (5 mg total)  by mouth 2 (two) times daily. 60 tablet 0   atorvastatin (LIPITOR) 80 MG tablet Take 1 tablet (80 mg total) by mouth daily. 30 tablet 3   cyclobenzaprine (FLEXERIL) 10 MG tablet Take 20 mg by mouth at bedtime.     dapagliflozin propanediol (FARXIGA) 10 MG TABS tablet Take 1 tablet (10 mg total) by mouth daily. 30 tablet 3   digoxin (LANOXIN) 0.125 MG tablet Take 1 tablet (0.125 mg total) by mouth daily. 30 tablet 3   furosemide (LASIX) 20 MG tablet Take 3 tablets (60 mg total) by mouth 2 (two) times daily. 90 tablet 1   metoprolol succinate (TOPROL-XL) 25 MG 24 hr tablet Take 0.5 tablets (12.5 mg total) by mouth daily. Take with or immediately following a meal. 45 tablet 3    metroNIDAZOLE (METROGEL) 1 % gel Apply 1 application topically daily. 60 g 0   oxymetazoline (AFRIN) 0.05 % nasal spray Place 2 sprays into both nostrils 2 (two) times daily as needed for congestion.     potassium chloride SA (KLOR-CON M) 20 MEQ tablet Take 2 tablets (40 mEq total) by mouth 2 (two) times daily. 120 tablet 3   spironolactone (ALDACTONE) 25 MG tablet Take 1 tablet (25 mg total) by mouth daily. 30 tablet 3   venlafaxine XR (EFFEXOR-XR) 75 MG 24 hr capsule Take 75 mg by mouth in the morning, at noon, and at bedtime.     zolpidem (AMBIEN) 10 MG tablet Take 5-10 mg by mouth at bedtime.     No current facility-administered medications for this encounter.   Facility-Administered Medications Ordered in Other Encounters  Medication Dose Route Frequency Provider Last Rate Last Admin   perflutren lipid microspheres (DEFINITY) IV suspension  1-10 mL Intravenous PRN Rafael Bihari, FNP   4 mL at 05/11/21 1357   BP 98/68    Pulse (!) 111    Wt 92.1 kg (203 lb)    SpO2 97%    BMI 28.72 kg/m   Wt Readings from Last 3 Encounters:  05/11/21 92.1 kg (203 lb)  05/09/21 89.4 kg (197 lb)  03/23/21 94.7 kg (208 lb 12.4 oz)   PHYSICAL EXAM: General:  Well appearing. No resp difficulty HEENT: normal Neck: supple. no JVD. Carotids 2+ bilat; no bruits. No lymphadenopathy or thryomegaly appreciated. Cor: PMI laterally displaced. Regular tachy No rubs, gallops or murmurs. Lungs: clear Abdomen: obese soft, nontender, nondistended. No hepatosplenomegaly. No bruits or masses. Good bowel sounds. Extremities: no cyanosis, clubbing, rash, edema Neuro: alert & orientedx3, cranial nerves grossly intact. moves all 4 extremities w/o difficulty. Affect pleasant   ECG: ST 118 inferior & inferolateral Qs. Personally reviewed   ASSESSMENT & PLAN: 1. 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 Echo EF 20-25%, severely dilated LV, RV okay, moderate MR - R/LHC (10/22): 1v CAD occluded  mid LAD PCWP 27,  Fick 4.7/2.  - cMRI EF 22% subendocardial LGE consistent with prior infarcts in LV basal inferolateral wall, apical anterior/septal/inferior walls and apex. No viability. RV okay. Not sure how to explain inferior defects on cMRI  - Echo today 05/11/21 EF < 20% RV ok Personally reviewed - Clinically much improved. NYHA I-II. Volume status looks good. However EF remains very low on echo and she is persistently tachycardic - Will start Ivabradine 6m bid and get CPX test to further risk stratify - Will also need evaluation for ICD in the new future but will await CPX results first - Continue Toprol XL  12.5 mg daily. - Continue lasix 60 mg bid. - Continue digoxin 0.125 mg daily. Last 0.4 on 02/22/21 - Continue Farxiga 10 mg daily.  - Continue spiro 25 mg daily. - Needs to remain off adderall with reduced EF.  2. Palpitations - Resolved. - No longer on Adderall. Does not consume caffeine.  - Zio 14-day (11/22) showed mostly SR, no high-grade arrhythmias.  3. CAD: - Single vessel LAD occlusion on LHC. - No s/s ischemia - Continue statin. - No ASA with Eliquis.   4. Tobacco use: - No longer smoking.   5. LV apical thrombus: - Noted on echo/cMRI.  - On Eliquis. No bleeding issues.    6. Mitral valve regurgitation: - Mild on echo today   Continue Paramedicine, appreciate their assistance. We discussed RTW today. She does Nurse, learning disability. I think she can return to work on a part time basis, no heavy lifting, with allowances for frequent rest breaks and/or days off as needed. She declined a note for work today.  Follow up in 2 months with Dr. Haroldine Laws + echo as scheduled.  Glori Bickers FNP 2:44 PM

## 2021-05-12 ENCOUNTER — Other Ambulatory Visit (HOSPITAL_COMMUNITY): Payer: Self-pay

## 2021-05-12 ENCOUNTER — Encounter (HOSPITAL_COMMUNITY): Payer: 59

## 2021-05-12 NOTE — Telephone Encounter (Signed)
Received notification from  [employer]  regarding a prior authorization for  [employer] . Authorization has been APPROVED from 05/11/21 to 05/11/22.   Per test claim, copay for 30 days supply is $55.94.  Authorization # [id number] Phone # [phone]- [phone]

## 2021-05-12 NOTE — Telephone Encounter (Signed)
Advanced Heart Failure Patient Advocate Encounter  Called and spoke with the patient. Went over co-pay information, confirmed that she had a co-pay card.  Charlann Boxer, CPhT

## 2021-05-15 ENCOUNTER — Other Ambulatory Visit: Payer: Self-pay

## 2021-05-15 ENCOUNTER — Encounter (HOSPITAL_COMMUNITY)
Admission: RE | Admit: 2021-05-15 | Discharge: 2021-05-15 | Disposition: A | Discharge status: Home / Self Care | Payer: 59 | Source: Ambulatory Visit | Attending: Internal Medicine | Admitting: Internal Medicine

## 2021-05-15 DIAGNOSIS — I5042 Chronic combined systolic (congestive) and diastolic (congestive) heart failure: Secondary | ICD-10-CM | POA: Diagnosis not present

## 2021-05-15 NOTE — Progress Notes (Signed)
Reviewed home exercise Rx with patient today. Reviewed THRR and keeping RPE between 11-13. Encouraged hydration with exercise. Encouraged exercise for 2-4 x/week in addition to her CRP2 program. Discussed weather parameters for temperature and humidity for safe exercise outdoors. Reviewed S/S to terminate exercise and when to cal 911 vs MD. Pt verbalized understanding of the home exercise Rx and was provided a copy.   Lesly Rubenstein MS, ACSM-CEP, CCRP

## 2021-05-17 ENCOUNTER — Encounter (HOSPITAL_COMMUNITY): Payer: 59

## 2021-05-19 ENCOUNTER — Encounter (HOSPITAL_COMMUNITY)
Admission: RE | Admit: 2021-05-19 | Discharge: 2021-05-19 | Disposition: A | Discharge status: Home / Self Care | Payer: 59 | Source: Ambulatory Visit | Attending: Internal Medicine | Admitting: Internal Medicine

## 2021-05-19 ENCOUNTER — Other Ambulatory Visit: Payer: Self-pay

## 2021-05-19 DIAGNOSIS — Z5189 Encounter for other specified aftercare: Secondary | ICD-10-CM | POA: Insufficient documentation

## 2021-05-19 DIAGNOSIS — I5042 Chronic combined systolic (congestive) and diastolic (congestive) heart failure: Secondary | ICD-10-CM | POA: Diagnosis not present

## 2021-05-22 ENCOUNTER — Other Ambulatory Visit: Payer: Self-pay

## 2021-05-22 ENCOUNTER — Encounter (HOSPITAL_COMMUNITY)
Admission: RE | Admit: 2021-05-22 | Discharge: 2021-05-22 | Disposition: A | Discharge status: Home / Self Care | Payer: 59 | Source: Ambulatory Visit | Attending: Internal Medicine | Admitting: Internal Medicine

## 2021-05-22 VITALS — Ht 70.5 in | Wt 200.2 lb

## 2021-05-22 DIAGNOSIS — Z5189 Encounter for other specified aftercare: Secondary | ICD-10-CM | POA: Diagnosis not present

## 2021-05-22 DIAGNOSIS — I5042 Chronic combined systolic (congestive) and diastolic (congestive) heart failure: Secondary | ICD-10-CM

## 2021-05-23 ENCOUNTER — Ambulatory Visit (HOSPITAL_COMMUNITY): Payer: 59 | Attending: Internal Medicine

## 2021-05-23 ENCOUNTER — Other Ambulatory Visit (HOSPITAL_COMMUNITY): Payer: Self-pay

## 2021-05-23 DIAGNOSIS — I5022 Chronic systolic (congestive) heart failure: Secondary | ICD-10-CM | POA: Diagnosis not present

## 2021-05-24 ENCOUNTER — Encounter (HOSPITAL_COMMUNITY): Payer: 59

## 2021-05-25 ENCOUNTER — Other Ambulatory Visit (HOSPITAL_COMMUNITY): Payer: Self-pay

## 2021-05-25 MED FILL — Atorvastatin Calcium Tab 80 MG (Base Equivalent): ORAL | 30 days supply | Qty: 30 | Fill #3 | Status: AC

## 2021-05-25 MED FILL — Digoxin Tab 125 MCG (0.125 MG): ORAL | 30 days supply | Qty: 30 | Fill #3 | Status: AC

## 2021-05-26 ENCOUNTER — Other Ambulatory Visit: Payer: Self-pay

## 2021-05-26 ENCOUNTER — Encounter (HOSPITAL_COMMUNITY)
Admission: RE | Admit: 2021-05-26 | Discharge: 2021-05-26 | Disposition: A | Discharge status: Home / Self Care | Payer: 59 | Source: Ambulatory Visit | Attending: Internal Medicine | Admitting: Internal Medicine

## 2021-05-26 DIAGNOSIS — I5042 Chronic combined systolic (congestive) and diastolic (congestive) heart failure: Secondary | ICD-10-CM

## 2021-05-26 DIAGNOSIS — Z5189 Encounter for other specified aftercare: Secondary | ICD-10-CM | POA: Diagnosis not present

## 2021-05-26 NOTE — Progress Notes (Signed)
Discharge Progress Report  Patient Details  Name: Christine [name] MRN: [id number] Date of Birth: Sep 22, 1965 Referring Provider:   Flowsheet Row CARDIAC REHAB PHASE II ORIENTATION from 03/23/2021 in [facility]  Referring Provider Glori Bickers, MD        Number of Visits: 8  Reason for Discharge:  Patient reached a stable level of exercise. Patient independent in their exercise. Patient has met program and personal goals.  Smoking History:  Social History   Tobacco Use  Smoking Status Former   Packs/day: 0.50   Types: Cigarettes   Quit date: 01/15/2021   Years since quitting: 0.3  Smokeless Tobacco Never    Diagnosis:  Chronic combined systolic and diastolic congestive heart failure ([place])  ADL UCSD:   Initial Exercise Prescription:  Initial Exercise Prescription - 03/23/21 1500       Date of Initial Exercise RX and Referring Provider   Date 03/23/21    Referring Provider Glori Bickers, MD    Expected Discharge Date 05/19/21      NuStep   Level 2    SPM 80    Minutes 30    METs 2.5      Prescription Details   Frequency (times per week) 3    Duration Progress to 30 minutes of continuous aerobic without signs/symptoms of physical distress      Intensity   THRR 40-80% of Max Heartrate 67-134    Ratings of Perceived Exertion 11-13    Perceived Dyspnea 0-4      Progression   Progression Continue progressive overload as per policy without signs/symptoms or physical distress.      Resistance Training   Training Prescription Yes    Weight 3 lbs    Reps 10-15             Discharge Exercise Prescription (Final Exercise Prescription Changes):  Exercise Prescription Changes - 05/26/21 1430       Response to Exercise   Blood Pressure (Admit) 98/60    Blood Pressure (Exercise) 114/66    Blood Pressure (Exit) 100/58    Heart Rate (Admit) 95 bpm    Heart Rate (Exercise) 105 bpm    Heart Rate (Exit) 93 bpm     Rating of Perceived Exertion (Exercise) 13    Symptoms None    Comments Pt graduated from the CRP2 program today    Duration Continue with 30 min of aerobic exercise without signs/symptoms of physical distress.    Intensity THRR unchanged      Progression   Progression Continue to progress workloads to maintain intensity without signs/symptoms of physical distress.    Average METs 2.1      Resistance Training   Training Prescription Yes    Weight 3 lbs    Reps 10-15    Time 10 Minutes      Interval Training   Interval Training No      NuStep   Level 3    SPM 85    Minutes 30    METs 2.1      Home Exercise Plan   Plans to continue exercise at Home (comment)    Frequency Add 4 additional days to program exercise sessions.    Initial Home Exercises Provided 05/15/21             Functional Capacity:  6 Minute Walk     Row Name 03/23/21 1352 05/19/21 1310       6 Minute Walk   Phase Initial  Discharge    Distance 1471 feet 1545 feet    Walk Time 6 minutes 6 minutes    # of Rest Breaks 0 0    MPH 2.79 2.93    METS 4.05 4.34    RPE 9 13    Perceived Dyspnea  11 0    VO2 Peak 14.19 15.18    Symptoms No No    Resting HR 98 bpm 100 bpm    Resting BP 98/70 110/74    Resting Oxygen Saturation  97 % 98 %    Exercise Oxygen Saturation  during 6 min walk 98 % 97 %    Max Ex. HR 126 bpm 124 bpm    Max Ex. BP 104/68 114/60    2 Minute Post BP 96/68 --             Psychological, QOL, Others - Outcomes: PHQ 2/9: Depression screen [facility] 2/9 06/07/2021 03/23/2021 02/06/2021  Decreased Interest 0 0 0  Down, Depressed, Hopeless 0 0 1  PHQ - 2 Score 0 0 1    Quality of Life:  Quality of Life - 05/22/21 1555       Quality of Life Scores   Health/Function Pre 14.27 %    Health/Function Post 21.2 %    Health/Function % Change 48.56 %    Socioeconomic Pre 18.38 %    Socioeconomic Post 26.07 %    Socioeconomic % Change  41.84 %    Psych/Spiritual Pre 20.79 %     Psych/Spiritual Post 25.07 %    Psych/Spiritual % Change 20.59 %    Family Pre 19.88 %    Family Post 22.13 %    Family % Change 11.32 %    GLOBAL Pre 17.24 %    GLOBAL Post 23.17 %    GLOBAL % Change 34.4 %             Personal Goals: Goals established at orientation with interventions provided to work toward goal.  Personal Goals and Risk Factors at Admission - 03/23/21 1451       Core Components/Risk Factors/Patient Goals on Admission    Weight Management Yes;Weight Loss    Intervention Weight Management: Develop a combined nutrition and exercise program designed to reach desired caloric intake, while maintaining appropriate intake of nutrient and fiber, sodium and fats, and appropriate energy expenditure required for the weight goal.;Weight Management: Provide education and appropriate resources to help participant work on and attain dietary goals.;Weight Management/Obesity: Establish reasonable short term and long term weight goals.    Admit Weight 208 lb 12.4 oz (94.7 kg)    Expected Outcomes Short Term: Continue to assess and modify interventions until short term weight is achieved;Long Term: Adherence to nutrition and physical activity/exercise program aimed toward attainment of established weight goal;Weight Maintenance: Understanding of the daily nutrition guidelines, which includes 25-35% calories from fat, 7% or less cal from saturated fats, less than 243m cholesterol, less than 1.5gm of sodium, & 5 or more servings of fruits and vegetables daily;Weight Loss: Understanding of general recommendations for a balanced deficit meal plan, which promotes 1-2 lb weight loss per week and includes a negative energy balance of [phone] kcal/d;Understanding recommendations for meals to include 15-35% energy as protein, 25-35% energy from fat, 35-60% energy from carbohydrates, less than 2042mof dietary cholesterol, 20-35 gm of total fiber daily;Understanding of distribution of calorie intake  throughout the day with the consumption of 4-5 meals/snacks    Heart Failure Yes  Intervention Provide a combined exercise and nutrition program that is supplemented with education, support and counseling about heart failure. Directed toward relieving symptoms such as shortness of breath, decreased exercise tolerance, and extremity edema.    Expected Outcomes Long term: Adoption of self-care skills and reduction of barriers for early signs and symptoms recognition and intervention leading to self-care maintenance.;Short term: Daily weights obtained and reported for increase. Utilizing diuretic protocols set by physician.;Short term: Attendance in program 2-3 days a week with increased exercise capacity. Reported lower sodium intake. Reported increased fruit and vegetable intake. Reports medication compliance.;Improve functional capacity of life    Hypertension --    Intervention --    Expected Outcomes --    Lipids Yes    Intervention Provide education and support for participant on nutrition & aerobic/resistive exercise along with prescribed medications to achieve LDL <75m, HDL >468m    Expected Outcomes Short Term: Participant states understanding of desired cholesterol values and is compliant with medications prescribed. Participant is following exercise prescription and nutrition guidelines.;Long Term: Cholesterol controlled with medications as prescribed, with individualized exercise RX and with personalized nutrition plan. Value goals: LDL < 7074mHDL > 40 mg.    Stress Yes    Intervention Offer individual and/or small group education and counseling on adjustment to heart disease, stress management and health-related lifestyle change. Teach and support self-help strategies.;Refer participants experiencing significant psychosocial distress to appropriate mental health specialists for further evaluation and treatment. When possible, include family members and significant others in  education/counseling sessions.    Expected Outcomes Short Term: Participant demonstrates changes in health-related behavior, relaxation and other stress management skills, ability to obtain effective social support, and compliance with psychotropic medications if prescribed.;Long Term: Emotional wellbeing is indicated by absence of clinically significant psychosocial distress or social isolation.              Personal Goals Discharge:  Goals and Risk Factor Review     Row Name 04/04/21 1536 05/02/21 1558           Core Components/Risk Factors/Patient Goals Review   Personal Goals Review Weight Management/Obesity;Lipids;Diabetes;Stress Weight Management/Obesity;Lipids;Diabetes;Stress      Review BetEustaquio Maize off to a good start to exercise at cardiac rehab. Systolic BP's have been in the 90's. BetEustaquio Maizes been asymptomatic. [name] was noted to be tacycardic on 04/921. [name] has not returned to exercise yet. Last day of exercise was on 04/07/21.      Expected Outcomes [name] will contiue to participate in phase 2 cardiac rehab for exercise, nutrition and life style modifications. [name] will contiue to participate in phase 2 cardiac rehab for exercise, nutrition and life style modifications.               Exercise Goals and Review:  Exercise Goals     Row Name 03/23/21 1500             Exercise Goals   Increase Physical Activity Yes       Intervention Provide advice, education, support and counseling about physical activity/exercise needs.;Develop an individualized exercise prescription for aerobic and resistive training based on initial evaluation findings, risk stratification, comorbidities and participant's personal goals.       Expected Outcomes Short Term: Attend rehab on a regular basis to increase amount of physical activity.;Long Term: Add in home exercise to make exercise part of routine and to increase amount of physical activity.;Long Term: Exercising regularly at least 3-5 days a  week.  Increase Strength and Stamina Yes       Intervention Provide advice, education, support and counseling about physical activity/exercise needs.;Develop an individualized exercise prescription for aerobic and resistive training based on initial evaluation findings, risk stratification, comorbidities and participant's personal goals.       Expected Outcomes Long Term: Improve cardiorespiratory fitness, muscular endurance and strength as measured by increased METs and functional capacity (6MWT);Short Term: Perform resistance training exercises routinely during rehab and add in resistance training at home;Short Term: Increase workloads from initial exercise prescription for resistance, speed, and METs.       Able to understand and use rate of perceived exertion (RPE) scale Yes       Intervention Provide education and explanation on how to use RPE scale       Expected Outcomes Short Term: Able to use RPE daily in rehab to express subjective intensity level;Long Term:  Able to use RPE to guide intensity level when exercising independently       Knowledge and understanding of Target Heart Rate Range (THRR) Yes       Intervention Provide education and explanation of THRR including how the numbers were predicted and where they are located for reference       Expected Outcomes Short Term: Able to state/look up THRR;Long Term: Able to use THRR to govern intensity when exercising independently;Short Term: Able to use daily as guideline for intensity in rehab       Understanding of Exercise Prescription Yes       Intervention Provide education, explanation, and written materials on patient's individual exercise prescription       Expected Outcomes Short Term: Able to explain program exercise prescription;Long Term: Able to explain home exercise prescription to exercise independently                Exercise Goals Re-Evaluation:  Exercise Goals Re-Evaluation     Row Name 03/29/21 1626 05/08/21  1500 05/15/21 1430 05/26/21 1448       Exercise Goal Re-Evaluation   Exercise Goals Review Increase Physical Activity;Increase Strength and Stamina;Able to understand and use rate of perceived exertion (RPE) scale;Knowledge and understanding of Target Heart Rate Range (THRR);Understanding of Exercise Prescription Increase Physical Activity;Increase Strength and Stamina;Able to understand and use rate of perceived exertion (RPE) scale;Knowledge and understanding of Target Heart Rate Range (THRR);Understanding of Exercise Prescription Increase Physical Activity;Increase Strength and Stamina;Able to understand and use rate of perceived exertion (RPE) scale;Knowledge and understanding of Target Heart Rate Range (THRR);Understanding of Exercise Prescription Increase Physical Activity;Increase Strength and Stamina;Able to understand and use rate of perceived exertion (RPE) scale;Knowledge and understanding of Target Heart Rate Range (THRR);Able to check pulse independently;Understanding of Exercise Prescription    Comments Pt's first day in the CRP2 program. Pt understnads the exercise Rx, THRR and RPE scale. Reviewed goals. No significant progress to goals as patient has only attend 4 session in the last 30+ days due to various issues such as illness and elevated heart rates. Reviewed home exercise Rx. Pt rides her stationary bike at home when not attending the [place] program for 25 minutes. Pt graduated from the [facility] program today. Pt was only able to attend 8 sessions. Pt had a peak MET level of 2.8. Pt has been riding her stationar bike at home 30 minutes 7x/week.    Expected Outcomes Will continue to montior patient and progress exercise workloads as tolerated. Will continue to montior patient and progress exercise workloads as tolerated. Pt will continue to exercise  at home on her stationary bike. Pt plans to continue to use her stationary bike daily for 30 minutes.             Nutrition & Weight -  Outcomes:  Pre Biometrics - 03/23/21 1308       Pre Biometrics   Waist Circumference 41 inches    Hip Circumference 51 inches    Waist to Hip Ratio 0.8 %    Triceps Skinfold 25 mm    % Body Fat 39.4 %    Grip Strength 18 kg    Flexibility 21.25 in    Single Leg Stand 21.25 seconds             Post Biometrics - 05/19/21 1558        Post  Biometrics   Height 5' 10.5" (1.791 m)    Weight 90.8 kg    Waist Circumference 40.25 inches    Hip Circumference 50.75 inches    Waist to Hip Ratio 0.79 %    BMI (Calculated) 28.31    Triceps Skinfold 25 mm    % Body Fat 38.5 %    Grip Strength 38 kg   111.0 % improvement over baseline   Flexibility 22.5 in    Single Leg Stand 15.75 seconds             Nutrition:   Nutrition Discharge:   Education Questionnaire Score:  Knowledge Questionnaire Score - 05/22/21 1604       Knowledge Questionnaire Score   Post Score 24/24             Goals reviewed with patient; copy given to patient.Debara graduated from cardiac rehab program on 05/26/21 with completion of 8 exercise sessions in Phase II. Pt maintained fair attendance as Kellan has several absent days and progressed nicely during his participation in rehab as evidenced by increased MET level.   Medication list reconciled. Repeat  PHQ score-  0.  Pt has made significant lifestyle changes and should be commended for her success. Pt feels she has achieved her goals during cardiac rehab.   Pt plans to continue exercise by riding her stationary bike. We are proud of Karmin's progress! Louiza increased her distance on her post exercise walk test by 74 feet.Barnet Pall, RN,BSN 06/07/2021 8:57 AM

## 2021-06-09 ENCOUNTER — Other Ambulatory Visit (HOSPITAL_COMMUNITY): Payer: Self-pay

## 2021-06-14 ENCOUNTER — Other Ambulatory Visit (HOSPITAL_COMMUNITY): Payer: Self-pay

## 2021-06-21 ENCOUNTER — Other Ambulatory Visit (HOSPITAL_COMMUNITY): Payer: Self-pay | Admitting: Adult Health

## 2021-06-21 ENCOUNTER — Encounter (HOSPITAL_COMMUNITY): Payer: Self-pay

## 2021-06-21 ENCOUNTER — Other Ambulatory Visit (HOSPITAL_COMMUNITY): Payer: Self-pay

## 2021-06-21 ENCOUNTER — Other Ambulatory Visit (HOSPITAL_COMMUNITY): Payer: Self-pay | Admitting: Internal Medicine

## 2021-06-21 MED ORDER — DIGOXIN 125 MCG PO TABS
0.1250 mg | ORAL_TABLET | Freq: Every day | ORAL | 3 refills | Status: DC
  Filled 2021-06-21: qty 30, 30d supply, fill #0
  Filled 2021-07-26: qty 30, 30d supply, fill #1
  Filled 2021-08-23: qty 30, 30d supply, fill #2
  Filled 2021-09-21: qty 30, 30d supply, fill #3

## 2021-06-21 MED ORDER — SPIRONOLACTONE 25 MG PO TABS
25.0000 mg | ORAL_TABLET | Freq: Every day | ORAL | 3 refills | Status: DC
  Filled 2021-06-21: qty 30, 30d supply, fill #0
  Filled 2021-07-26: qty 30, 30d supply, fill #1
  Filled 2021-08-23: qty 30, 30d supply, fill #2
  Filled 2021-09-21: qty 30, 30d supply, fill #3

## 2021-06-21 MED ORDER — ATORVASTATIN CALCIUM 80 MG PO TABS
80.0000 mg | ORAL_TABLET | Freq: Every day | ORAL | 3 refills | Status: DC
Start: 2021-06-21 — End: 2021-09-26
  Filled 2021-06-21: qty 30, 30d supply, fill #0
  Filled 2021-06-26: qty 30, 30d supply, fill #1
  Filled 2021-07-26: qty 30, 30d supply, fill #2
  Filled 2021-08-31: qty 30, 30d supply, fill #3

## 2021-06-21 MED ORDER — DAPAGLIFLOZIN PROPANEDIOL 10 MG PO TABS
10.0000 mg | ORAL_TABLET | Freq: Every day | ORAL | 3 refills | Status: DC
  Filled 2021-06-21: qty 30, 30d supply, fill #0
  Filled 2021-07-26: qty 30, 30d supply, fill #1
  Filled 2021-08-23: qty 30, 30d supply, fill #2
  Filled 2021-09-21: qty 30, 30d supply, fill #3

## 2021-06-26 ENCOUNTER — Encounter (HOSPITAL_COMMUNITY): Payer: Self-pay | Admitting: Internal Medicine

## 2021-06-26 ENCOUNTER — Other Ambulatory Visit (HOSPITAL_COMMUNITY): Payer: Self-pay

## 2021-06-26 ENCOUNTER — Ambulatory Visit (HOSPITAL_COMMUNITY)
Admission: RE | Admit: 2021-06-26 | Discharge: 2021-06-26 | Disposition: A | Discharge status: Home / Self Care | Payer: 59 | Source: Ambulatory Visit | Attending: Internal Medicine | Admitting: Internal Medicine

## 2021-06-26 ENCOUNTER — Other Ambulatory Visit: Payer: Self-pay

## 2021-06-26 VITALS — BP 118/70 | HR 93 | Wt 198.0 lb

## 2021-06-26 DIAGNOSIS — R5383 Other fatigue: Secondary | ICD-10-CM | POA: Diagnosis not present

## 2021-06-26 DIAGNOSIS — I5022 Chronic systolic (congestive) heart failure: Secondary | ICD-10-CM | POA: Insufficient documentation

## 2021-06-26 DIAGNOSIS — F909 Attention-deficit hyperactivity disorder, unspecified type: Secondary | ICD-10-CM | POA: Diagnosis not present

## 2021-06-26 DIAGNOSIS — R0683 Snoring: Secondary | ICD-10-CM | POA: Insufficient documentation

## 2021-06-26 DIAGNOSIS — R9431 Abnormal electrocardiogram [ECG] [EKG]: Secondary | ICD-10-CM | POA: Insufficient documentation

## 2021-06-26 DIAGNOSIS — I340 Nonrheumatic mitral (valve) insufficiency: Secondary | ICD-10-CM | POA: Insufficient documentation

## 2021-06-26 DIAGNOSIS — G4710 Hypersomnia, unspecified: Secondary | ICD-10-CM | POA: Insufficient documentation

## 2021-06-26 DIAGNOSIS — I513 Intracardiac thrombosis, not elsewhere classified: Secondary | ICD-10-CM | POA: Insufficient documentation

## 2021-06-26 DIAGNOSIS — F32A Depression, unspecified: Secondary | ICD-10-CM | POA: Diagnosis not present

## 2021-06-26 DIAGNOSIS — I255 Ischemic cardiomyopathy: Secondary | ICD-10-CM | POA: Diagnosis not present

## 2021-06-26 DIAGNOSIS — R002 Palpitations: Secondary | ICD-10-CM | POA: Insufficient documentation

## 2021-06-26 DIAGNOSIS — R0609 Other forms of dyspnea: Secondary | ICD-10-CM | POA: Insufficient documentation

## 2021-06-26 DIAGNOSIS — R252 Cramp and spasm: Secondary | ICD-10-CM | POA: Diagnosis not present

## 2021-06-26 DIAGNOSIS — Z7901 Long term (current) use of anticoagulants: Secondary | ICD-10-CM | POA: Diagnosis not present

## 2021-06-26 DIAGNOSIS — R232 Flushing: Secondary | ICD-10-CM | POA: Insufficient documentation

## 2021-06-26 DIAGNOSIS — I2510 Atherosclerotic heart disease of native coronary artery without angina pectoris: Secondary | ICD-10-CM | POA: Insufficient documentation

## 2021-06-26 DIAGNOSIS — Z87891 Personal history of nicotine dependence: Secondary | ICD-10-CM | POA: Insufficient documentation

## 2021-06-26 DIAGNOSIS — Z79899 Other long term (current) drug therapy: Secondary | ICD-10-CM | POA: Diagnosis not present

## 2021-06-26 DIAGNOSIS — F419 Anxiety disorder, unspecified: Secondary | ICD-10-CM | POA: Diagnosis not present

## 2021-06-26 DIAGNOSIS — Z7984 Long term (current) use of oral hypoglycemic drugs: Secondary | ICD-10-CM | POA: Insufficient documentation

## 2021-06-26 LAB — CBC
HCT: 47.4 % — ABNORMAL HIGH (ref 36.0–46.0)
Hemoglobin: 16.3 g/dL — ABNORMAL HIGH (ref 12.0–15.0)
MCH: 29.5 pg (ref 26.0–34.0)
MCHC: 34.4 g/dL (ref 30.0–36.0)
MCV: 85.9 fL (ref 80.0–100.0)
Platelets: 368 10*3/uL (ref 150–400)
RBC: 5.52 MIL/uL — ABNORMAL HIGH (ref 3.87–5.11)
RDW: 12.6 % (ref 11.5–15.5)
WBC: 11.2 10*3/uL — ABNORMAL HIGH (ref 4.0–10.5)
nRBC: 0 % (ref 0.0–0.2)

## 2021-06-26 LAB — BASIC METABOLIC PANEL
Anion gap: 12 (ref 5–15)
BUN: 15 mg/dL (ref 6–20)
CO2: 26 mmol/L (ref 22–32)
Calcium: 9.5 mg/dL (ref 8.9–10.3)
Chloride: 101 mmol/L (ref 98–111)
Creatinine, Ser: 0.84 mg/dL (ref 0.44–1.00)
GFR, Estimated: >60 mL/min (ref >60)
Glucose, Bld: 111 mg/dL — ABNORMAL HIGH (ref 70–99)
Potassium: 3.6 mmol/L (ref 3.5–5.1)
Sodium: 139 mmol/L (ref 135–145)

## 2021-06-26 LAB — BRAIN NATRIURETIC PEPTIDE: B Natriuretic Peptide: 69 pg/mL (ref 0.0–100.0)

## 2021-06-26 MED ORDER — METOPROLOL SUCCINATE ER 25 MG PO TB24
25.0000 mg | ORAL_TABLET | Freq: Every day | ORAL | 11 refills | Status: DC
  Filled 2021-06-26 – 2021-07-19 (×2): qty 30, 30d supply, fill #0
  Filled 2021-08-18: qty 30, 30d supply, fill #1
  Filled 2021-09-21: qty 30, 30d supply, fill #2
  Filled 2021-10-17: qty 30, 30d supply, fill #3
  Filled 2021-11-16: qty 30, 30d supply, fill #4
  Filled 2021-12-08: qty 30, 30d supply, fill #5
  Filled 2022-01-10: qty 30, 30d supply, fill #6
  Filled 2022-02-11: qty 30, 30d supply, fill #7
  Filled 2022-03-13: qty 30, 30d supply, fill #8
  Filled 2022-04-05 (×2): qty 30, 30d supply, fill #9
  Filled 2022-05-08: qty 30, 30d supply, fill #10
  Filled 2022-06-06: qty 30, 30d supply, fill #11

## 2021-06-26 MED ORDER — LOSARTAN POTASSIUM 25 MG PO TABS
25.0000 mg | ORAL_TABLET | Freq: Every day | ORAL | 3 refills | Status: DC
  Filled 2021-06-26: qty 90, 90d supply, fill #0

## 2021-06-26 NOTE — Progress Notes (Signed)
Height:     ?Weight: ?BMI: ? ?Today's Date: ? ?STOP BANG RISK ASSESSMENT ?S (snore) Have you been told that you snore?     YES ?  ?T (tired) Are you often tired, fatigued, or sleepy during the day?  ? YES  ?O (obstruction) Do you stop breathing, choke, or gasp during sleep? NO ?  ?P (pressure) Do you have or are you being treated for high blood pressure? YES ?  ?B (BMI) Is your body index greater than 35 kg/m? NO ?  ?A (age) Are you 54 years old or older? YES ?  ?N (neck) Do you have a neck circumference greater than 16 inches?  ? NO ?  ?G (gender) Are you a female? NO ?  ?TOTAL STOP/BANG ?YES? ANSWERS 4  ? ?                                                                    For Office Use Only              Procedure Order Form    ?YES to 3+ Stop Bang questions OR two clinical symptoms - patient qualifies for WatchPAT (CPT 95800)     ? ?Clinical Notes: Will consult Sleep Specialist and refer for management of therapy due to patient increased risk of Sleep Apnea. Ordering a sleep study due to the following two clinical symptoms: Excessive daytime sleepiness G47.10 / Morning Headaches G44.221 / Difficulty concentrating R41.840 / Memory problems or poor judgment G31.84 / Personality changes or irritability R45.4 / Loud snoring R06.83 / Unrefreshed by sleep G47.8 / History of high blood pressure R03.0 / Insomnia G47.00  ? ? ? ? ? ?

## 2021-06-26 NOTE — Progress Notes (Signed)
? ?ADVANCED HEART FAILURE CLINIC NOTE ? ? ?PCP: [facility] ?Cardiology: Dr Oval Linsey ?HF Cardiologist: Dr Haroldine Laws ? ?HPI: ?Christine [name] is a 56 y.o.female with history of chronic tobacco use, ADHD, anxiety, depression, CAD,  systolic HF ?  ?Presented to ED 01/20/21 with increased shortness of breath/tachycardia. Adderrall stopped. Echo with EF 20-25%,  LHC/RHC with single vessel CAD (occluded mLAD) elevated filling pressures and  moderately reduced CO. Digoxin added. Beta blocker stopped.  Discharged to home 01/25/21. Discharge weight 213 pounds.  ? ?Today she returns for HF follow up. Recent CPX looked good. Feeling better. Continues to work out on [employer] (like Nu-step). Every morning has palpitations. Lasts 15-69mns. Has hot flashes with it. No CP,edema, orthopnea or PND. Mild DOE. + cramps in feet + snoring. + daytime fatigue ? ?- Echo 05/11/21 EF < 20% LV severely dilated RV ok Mild MR  Personally reviewed ? ?Cardiac Testing  ?- Echo 01/21/2021 EF 20-25% LV severely dilated, LV apical mural thrombus, RV okay, moderate MR. ? ?- R/LHC w/ severe 1v CAD with occlusion of mid LAD., PCW 27, CO 4.7, CI 2.2  ?  ?- cMRI demonstrated subendocardial LGE consistent with prior infarcts in LV basal inferolateral wall, apical anterior/septal/inferior walls and apex. LVEF 22% ? ?CPX 3/23 ? ?FVC 3.60 (85%)      ?FEV1 2.89 (87%)        ?FEV1/FVC 80 (100%)        ?MVV 171 (157%)  ? ?Resting HR: 113 Standing HR: 113 Peak HR: 149   (89 % age predicted max HR)  ?BP rest: 98/60 Standing BP: 90/62 BP peak: 134/58  ?Peak VO2: 18.8 (90% predicted peak VO2)  ?VE/VCO2 slope:  33 ?Peak RER: 1.14  ?VE/MVV:  38% ?O2pulse:  11   (100 % predicted O2pulse)  ? ?ROS: All systems negative except as listed in HPI, PMH and Problem List. ? ?SH:  ?Social History  ? ?Socioeconomic History  ? Marital status: Single  ?  Spouse name: Not on file  ? Number of children: Not on file  ? Years of education: 125 ? Highest education level:  Not on file  ?Occupational History  ? Not on file  ?Tobacco Use  ? Smoking status: Former  ?  Packs/day: 0.50  ?  Types: Cigarettes  ?  Quit date: 01/15/2021  ?  Years since quitting: 0.4  ? Smokeless tobacco: Never  ?Vaping Use  ? Vaping Use: Never used  ?Substance and Sexual Activity  ? Alcohol use: Yes  ?  Comment: rarely  ? Drug use: Never  ? Sexual activity: Not on file  ?Other Topics Concern  ? Not on file  ?Social History Narrative  ? Not on file  ? ?Social Determinants of Health  ? ?Financial Resource Strain: Medium Risk  ? Difficulty of Paying Living Expenses: Somewhat hard  ?Food Insecurity: No Food Insecurity  ? Worried About RCharity fundraiserin the Last Year: Never true  ? Ran Out of Food in the Last Year: Never true  ?Transportation Needs: No Transportation Needs  ? Lack of Transportation (Medical): No  ? Lack of Transportation (Non-Medical): No  ?Physical Activity: Not on file  ?Stress: Not on file  ?Social Connections: Not on file  ?Intimate Partner Violence: Not on file  ? ?FH:  ?Family History  ?Problem Relation Age of Onset  ? Stroke Mother   ? Atrial fibrillation Mother   ? Heart failure Father   ? ?Past Medical History:  ?  Diagnosis Date  ? Anxiety 01/20/2021  ? CHF (congestive heart failure) ([place])   ? Shortness of breath 01/20/2021  ? Tobacco abuse 01/20/2021  ? ?Current Outpatient Medications  ?Medication Sig Dispense Refill  ? acetaminophen (TYLENOL) 500 MG tablet Take 500-1,000 mg by mouth every 6 (six) hours as needed.    ? ALPRAZolam (XANAX) 1 MG tablet Take 2 mg by mouth 3 (three) times daily.    ? apixaban (ELIQUIS) 5 MG TABS tablet Take 1 tablet (5 mg total) by mouth 2 (two) times daily. 60 tablet 0  ? atorvastatin (LIPITOR) 80 MG tablet Take 1 tablet (80 mg total) by mouth daily. 30 tablet 3  ? cyclobenzaprine (FLEXERIL) 10 MG tablet Take 20 mg by mouth at bedtime.    ? dapagliflozin propanediol (FARXIGA) 10 MG TABS tablet Take 1 tablet (10 mg total) by mouth daily. 30 tablet 3  ?  digoxin (LANOXIN) 0.125 MG tablet Take 1 tablet (0.125 mg total) by mouth daily. 30 tablet 3  ? furosemide (LASIX) 20 MG tablet Take 3 tablets (60 mg total) by mouth 2 (two) times daily. 90 tablet 1  ? ivabradine (CORLANOR) 5 MG TABS tablet Take 1 tablet (5 mg total) by mouth 2 (two) times daily with a meal. 60 tablet 6  ? metoprolol succinate (TOPROL-XL) 25 MG 24 hr tablet Take 0.5 tablets (12.5 mg total) by mouth daily. Take with or immediately following a meal. 45 tablet 3  ? metroNIDAZOLE (METROGEL) 1 % gel Apply 1 application topically daily. 60 g 0  ? oxymetazoline (AFRIN) 0.05 % nasal spray Place 2 sprays into both nostrils 2 (two) times daily as needed for congestion.    ? potassium chloride SA (KLOR-CON M) 20 MEQ tablet Take 2 tablets (40 mEq total) by mouth 2 (two) times daily. 120 tablet 3  ? spironolactone (ALDACTONE) 25 MG tablet Take 1 tablet (25 mg total) by mouth daily. 30 tablet 3  ? venlafaxine XR (EFFEXOR-XR) 75 MG 24 hr capsule Take 75 mg by mouth in the morning, at noon, and at bedtime.    ? zolpidem (AMBIEN) 10 MG tablet Take 5-10 mg by mouth at bedtime.    ? ?No current facility-administered medications for this encounter.  ? ?BP 118/70   Pulse 93   Wt 89.8 kg (198 lb)   SpO2 96%   BMI 28.01 kg/m?  ? ?Wt Readings from Last 3 Encounters:  ?06/26/21 89.8 kg (198 lb)  ?05/19/21 90.8 kg (200 lb 2.8 oz)  ?05/11/21 92.1 kg (203 lb)  ? ?PHYSICAL EXAM: ?General:  Well appearing. No resp difficulty ?HEENT: normal ?Neck: supple. no JVD. Carotids 2+ bilat; no bruits. No lymphadenopathy or thryomegaly appreciated. ?Cor: PMI nondisplaced. Regular rate & rhythm. No rubs, gallops or murmurs. ?Lungs: clear ?Abdomen: obese soft, nontender, nondistended. No hepatosplenomegaly. No bruits or masses. Good bowel sounds. ?Extremities: no cyanosis, clubbing, rash, edema ?Neuro: alert & orientedx3, cranial nerves grossly intact. moves all 4 extremities w/o difficulty. Affect pleasant ? ? ?ECG: NSR 95 inferior qs  Personally reviewed ? ?ASSESSMENT & PLAN: ?1. 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 Echo EF 20-25%, severely dilated LV, RV okay, moderate MR ?- R/LHC (10/22): 1v CAD occluded mid LAD PCWP 27,  Fick 4.7/2.  ?- cMRI EF 22% subendocardial LGE consistent with prior infarcts in LV basal inferolateral wall, apical anterior/septal/inferior walls and apex. No viability. RV okay. Not sure how to explain inferior defects on cMRI  ?- Echo 05/11/21 EF <  20% RV ok Personally reviewed ?- CPX 1/23 with very mild HF limitation:  Peak VO2: 18.8 (90% predicted peak VO2) VE/VCO2 slope:  33 Peak RER: 1.14  ?- Clinically much improved. NYHA II. Volume status looks good.  ?- Continue Ivabradine 1m bid ?- Increase Toprol XL 25 mg daily. ?- Add losartan 25 daily. [facility]  ?- Continue lasix 60 mg bid. ?- Continue digoxin 0.125 mg daily. Last 0.4 on 02/22/21 ?- Continue Farxiga 10 mg daily.  ?- Continue spiro 25 mg daily. ?- Needs to remain off adderall with reduced EF. ?- Refer ICD  ?- Labs today ? ?2. Palpitations ?- Resolved. ?- No longer on Adderall. Does not consume caffeine.  ?- Zio 14-day (11/22) showed mostly SR, no high-grade arrhythmias. ? ?3. CAD: ?- Single vessel LAD occlusion on LHC. ?- No s/s ischemia ?- Continue statin. ?- No ASA with Eliquis. ?  ?4. Tobacco use: ?- No longer smoking.  ? ?5. LV apical thrombus: ?- Noted on echo/cMRI.  ?- On Eliquis. No bleeding issues.  ? ?6. Snoring/Daytime somnolence ?- check home sleep study ?  ? ?DGlori BickersMD ?12:03 PM ? ?

## 2021-06-26 NOTE — Patient Instructions (Signed)
Medication Changes: ? ?Increase toprol to 25 mg daily. ? ?Start Losartan 25 mg daily  ? ?Lab Work: ? ?Labs done today, your results will be available in MyChart, we will contact you for abnormal readings. ? ? ?Testing/Procedures: ? ?Your provider has recommended that you have a home sleep study.  We have provided you with the equipment in our office today. Please download the app and follow the instructions. YOUR PIN NUMBER IS: [id number]. Once you have completed the test you just dispose of the equipment, the information is automatically uploaded to [place] via blue-tooth technology. If your test is positive for sleep apnea and you need a home CPAP machine you will be contacted by Dr Theodosia Blender office [facility]) to set this up. ?ONCE APPROVED BY YOUR INSURANCE COMPANY WE WILL CALL YOU TO TAKE THE TEST  ? ?Referrals: ? ?You have been referred to Electrophysiologist. They will call you to arrange your appointment ? ? ?Special Instructions // Education: ? ?none ? ?Follow-Up in: 4 months  ? ?At the [facility], you and your health needs are our priority. We have a designated team specialized in the treatment of Heart Failure. This Care Team includes your primary Heart Failure Specialized Cardiologist (physician), Advanced Practice Providers (APPs- Physician Assistants and Nurse Practitioners), and Pharmacist who all work together to provide you with the care you need, when you need it.  ? ?You may see any of the following providers on your designated Care Team at your next follow up: ? ?Dr Glori Bickers ?Dr Loralie Champagne ?Darrick Grinder, NP ?Lyda Jester, PA ?Jessica Milford,NP ?Marlyce Huge, PA ?Audry Riles, PharmD ? ? ?Please be sure to bring in all your medications bottles to every appointment.  ? ?Need to Contact us: ? ?If you have any questions or concerns before your next appointment please send [place] a message through [place] or call our office at [phone].   ? ?TO LEAVE A MESSAGE FOR THE NURSE  SELECT OPTION 2, PLEASE LEAVE A MESSAGE INCLUDING: ?YOUR NAME ?DATE OF BIRTH ?CALL BACK NUMBER ?REASON FOR CALL**this is important as we prioritize the call backs ? ?YOU WILL RECEIVE A CALL BACK THE SAME DAY AS LONG AS YOU CALL BEFORE 4:00 PM ? ? ?

## 2021-06-26 NOTE — Addendum Note (Signed)
Encounter addended by: Jerl Mina, RN on: 06/26/2021 12:27 PM ? Actions taken: Pharmacy for encounter modified, Order list changed, Diagnosis association updated, Charge Capture section accepted, Clinical Note Signed

## 2021-06-30 ENCOUNTER — Telehealth (HOSPITAL_COMMUNITY): Payer: Self-pay | Admitting: Surgery

## 2021-06-30 NOTE — Telephone Encounter (Signed)
Patient contacted to proceed with ordered home sleep study as insurance prior Josem Kaufmann is not required. She tells me that she will complete within a week. ?

## 2021-07-05 ENCOUNTER — Encounter (HOSPITAL_COMMUNITY): Payer: Self-pay

## 2021-07-05 ENCOUNTER — Encounter (INDEPENDENT_AMBULATORY_CARE_PROVIDER_SITE_OTHER): Payer: 59 | Admitting: Cardiology

## 2021-07-05 DIAGNOSIS — G4733 Obstructive sleep apnea (adult) (pediatric): Secondary | ICD-10-CM | POA: Diagnosis not present

## 2021-07-06 ENCOUNTER — Other Ambulatory Visit (HOSPITAL_COMMUNITY): Payer: Self-pay

## 2021-07-07 ENCOUNTER — Other Ambulatory Visit (HOSPITAL_COMMUNITY): Payer: Self-pay

## 2021-07-07 NOTE — Procedures (Signed)
? ? ?  Sleep Study Report ?Patient Information ?Study Date: 07/05/21 ?Patient Name: Christine [name] ?Patient ID: [id number] ?Birth Date: 2065/12/17 ?Age: 56 ?Gender: Female ?Referring Physician: Pierre Bali, MD ? ?TEST DESCRIPTION: Home sleep apnea testing was completed using the WatchPat, a Type 1 device, utilizing ?peripheral arterial tonometry (PAT), chest movement, actigraphy, pulse oximetry, pulse rate, body position and snore. ?AHI was calculated with apnea and hypopnea using valid sleep time as the denominator. RDI includes apneas, ?hypopneas, and RERAs. The data acquired and the scoring of sleep and all associated events were performed in ?accordance with the recommended standards and specifications as outlined in the AASM Manual for the Scoring of ?Sleep and Associated Events 2.2.0 (2015). ? ?FINDINGS: ?1. Mild Obstructive Sleep Apnea with AHI 11.8/hr. ?2. No Central Sleep Apnea with pAHIc 0.2/hr. ?3. Oxygen desaturations as low as 83%. ?4. Minimal snoring was present. O2 sats were < 88% for 2.5 min. ?5. Total sleep time was 5 hrs and 51 min. ?6. 27.7% of total sleep time was spent in REM sleep. ?7. Normal sleep onset latency at 16 min. ?8. Normal REM sleep onset latency at 81 min. ?9. Total awakenings were 7. ? ?DIAGNOSIS: ?Mild Obstructive Sleep Apnea (G47.33) ? ?RECOMMENDATIONS: ?1. Clinical correlation of these findings is necessary. The decision to treat obstructive sleep apnea (OSA) is usually ?based on the presence of apnea symptoms or the presence of associated medical conditions such as Hypertension, ?Congestive Heart Failure, Atrial Fibrillation or Obesity. The most common symptoms of OSA are snoring, gasping for ?breath while sleeping, daytime sleepiness and fatigue. ? ?2. Initiating apnea therapy is recommended given the presence of symptoms and/or associated conditions. ?Recommend proceeding with one of the following: ? ? a. Auto-CPAP therapy with a pressure range of 5-20cm H2O. ? ? b. An oral  appliance (OA) that can be obtained from certain dentists with expertise in sleep medicine. These are ?primarily of use in non-obese patients with mild and moderate disease. ? ? c. An ENT consultation which may be useful to look for specific causes of obstruction and possible treatment ?options. ? ? d. If patient is intolerant to PAP therapy, consider referral to ENT for evaluation for hypoglossal nerve stimulator. ? ?3. Close follow-up is necessary to ensure success with CPAP or oral appliance therapy for maximum benefit . ? ?4. A follow-up oximetry study on CPAP is recommended to assess the adequacy of therapy and determine the need ?for supplemental oxygen or the potential need for Bi-level therapy. An arterial blood gas to determine the adequacy of ?baseline ventilation and oxygenation should also be considered. ? ?5. Healthy sleep recommendations include: adequate nightly sleep (normal 7-9 hrs/night), avoidance of caffeine after ?noon and alcohol near bedtime, and maintaining a sleep environment that is cool, dark and quiet. ? ?6. Weight loss for overweight patients is recommended. Even modest amounts of weight loss can significantly ?improve the severity of sleep apnea. ? ?7. Snoring recommendations include: weight loss where appropriate, side sleeping, and avoidance of alcohol before ?bed. ? ?8. Operation of motor vehicle should be avoided when sleepy. ? ?Signature: ?Electronically Signed: 07/07/21 ?Fransico Him, MD; [facility]; [place], [place] Board of Sleep Medicine ? ?

## 2021-07-10 ENCOUNTER — Ambulatory Visit: Payer: 59

## 2021-07-10 DIAGNOSIS — G4733 Obstructive sleep apnea (adult) (pediatric): Secondary | ICD-10-CM

## 2021-07-11 ENCOUNTER — Other Ambulatory Visit: Payer: Self-pay | Admitting: Cardiovascular Disease

## 2021-07-11 ENCOUNTER — Other Ambulatory Visit: Payer: Self-pay

## 2021-07-11 ENCOUNTER — Telehealth: Payer: Self-pay | Admitting: *Deleted

## 2021-07-11 DIAGNOSIS — G4733 Obstructive sleep apnea (adult) (pediatric): Secondary | ICD-10-CM

## 2021-07-11 NOTE — Telephone Encounter (Signed)
Patient notified Christine [name] shows mild OSA. Dr Radford Pax has ordered a CPAP machine. Orders will be sent to [facility] due to her insurance. Once they have verified her benefits she will be contacted by them to come in for set up appointment. All of her questions were answered. APAP order faxed to  [facility]. ?

## 2021-07-12 ENCOUNTER — Ambulatory Visit (INDEPENDENT_AMBULATORY_CARE_PROVIDER_SITE_OTHER): Payer: 59 | Admitting: Internal Medicine

## 2021-07-12 ENCOUNTER — Encounter: Payer: Self-pay | Admitting: Internal Medicine

## 2021-07-12 VITALS — BP 92/62 | HR 74 | Ht 70.5 in | Wt 202.2 lb

## 2021-07-12 DIAGNOSIS — I5022 Chronic systolic (congestive) heart failure: Secondary | ICD-10-CM

## 2021-07-12 NOTE — Patient Instructions (Addendum)
Medication Instructions:  ?Your physician recommends that you continue on your current medications as directed. Please refer to the Current Medication list given to you today. ? ?Labwork: ?None ordered. ? ?Testing/Procedures: ?Your physician has recommended that you have a defibrillator inserted. An implantable cardioverter defibrillator (ICD) is a small device that is placed in your chest or, in rare cases, your abdomen. This device uses electrical pulses or shocks to help control life-threatening, irregular heartbeats that could lead the heart to suddenly stop beating (sudden cardiac arrest). Leads are attached to the ICD that goes into your heart. This is done in the hospital and usually requires an overnight stay. Please see the instruction sheet given to you today for more information. ? ?Follow-Up: ? ?SEE INSTRUCTION LETTER ? ?Any Other Special Instructions Will Be Listed Below (If Applicable). ? ?If you need a refill on your cardiac medications before your next appointment, please call your pharmacy.  ? ?ICD INSTRUCTIONAL PAMPHLET GIVEN ? ? ? ?

## 2021-07-12 NOTE — H&P (View-Only) (Signed)
Okay ? ? ? ? ?ELECTROPHYSIOLOGY CONSULT NOTE  ?Patient ID: Christine [name], MRN: [id number], DOB/AGE: March 19, 1966 56 y.o. ?Admit date: (Not on file) ?Date of Consult: 07/12/2021 ? ?Primary Physician: Ladell Pier, MD ?Primary Cardiologist: DB ?  ?  ?Christine [name] is a 56 y.o. female who is being seen today for the evaluation of ICD at the request of DB.  ? ? ?HPI ?Christine [name] is a 56 y.o. female referred for an ICD.  Presented 10/22 with shortness of breath and tachycardia.  Found to have severe left ventricular dysfunction and underwent catheterization demonstrating single-vessel coronary disease with an occluded mid LAD.  cMRI is as below. ? ?She feels great mostly.  She has no nocturnal dyspnea edema or chest pain.  Denies dyspnea on exertion but has orthopnea so that she is uncomfortable sleeping flat.  CPX was remarkably good with VO2 max of over 18. ? ?She has palpitations that are annoying most mornings.  Last 15-30 minutes.  Reviewed the event recorder with her from the fall; all of the triggered events were associated with sinus rhythm mostly not with tachycardia. ? ?DATE TEST EF   ?10/22/  Echo   20% % [place] thrombus ?MR mod  ?10/22 LHC  LAD:m-100  ? cMRI 22% LGE consistent with MI  ?1/23 Echo  20%    ? ?Date Cr K Hgb  ?10/22   12.9  ?3/13 0.84 3.6 16.3  ?      ? ?Event recorder personally reviewed.  11/22.  VT-nonsustained; sinus average 95 ? ? ?Past Medical History:  ?Diagnosis Date  ? Anxiety 01/20/2021  ? CHF (congestive heart failure) ([place])   ? Shortness of breath 01/20/2021  ? Tobacco abuse 01/20/2021  ?   ? ?Surgical History:  ?Past Surgical History:  ?Procedure Laterality Date  ? ABDOMINAL HYSTERECTOMY    ? CARDIAC CATHETERIZATION    ? CHOLECYSTECTOMY    ? NECK SURGERY    ? OVARIAN CYST SURGERY    ? RIGHT/LEFT HEART CATH AND CORONARY ANGIOGRAPHY N/A 01/23/2021  ? Procedure: RIGHT/LEFT HEART CATH AND CORONARY ANGIOGRAPHY;  Surgeon: Jolaine Artist, MD;  Location: [facility];   Service: Cardiovascular;  Laterality: N/A;  ?  ? ?Home Meds: ?Current Meds  ?Medication Sig  ? acetaminophen (TYLENOL) 500 MG tablet Take 500-1,000 mg by mouth every 6 (six) hours as needed.  ? ALPRAZolam (XANAX) 1 MG tablet Take 2 mg by mouth 3 (three) times daily.  ? apixaban (ELIQUIS) 5 MG TABS tablet Take 1 tablet (5 mg total) by mouth 2 (two) times daily.  ? atorvastatin (LIPITOR) 80 MG tablet Take 1 tablet (80 mg total) by mouth daily.  ? cyclobenzaprine (FLEXERIL) 10 MG tablet Take 20 mg by mouth at bedtime.  ? dapagliflozin propanediol (FARXIGA) 10 MG TABS tablet Take 1 tablet (10 mg total) by mouth daily.  ? digoxin (LANOXIN) 0.125 MG tablet Take 1 tablet (0.125 mg total) by mouth daily.  ? furosemide (LASIX) 20 MG tablet TAKE 3 TABLETS (60 MG TOTAL) BY MOUTH 2 (TWO) TIMES DAILY.  ? ivabradine (CORLANOR) 5 MG TABS tablet Take 1 tablet (5 mg total) by mouth 2 (two) times daily with a meal.  ? losartan (COZAAR) 25 MG tablet Take 25 mg by mouth daily. Pt takes 1/2 table 12.5 mg once daily.  ? metoprolol succinate (TOPROL-XL) 25 MG 24 hr tablet Take 1 tablet (25 mg total) by mouth daily. Take with or immediately following a meal.  ? metroNIDAZOLE (METROGEL) 1 %  gel Apply 1 application topically daily.  ? oxymetazoline (AFRIN) 0.05 % nasal spray Place 2 sprays into both nostrils 2 (two) times daily as needed for congestion.  ? potassium chloride SA (KLOR-CON M) 20 MEQ tablet Take 2 tablets (40 mEq total) by mouth 2 (two) times daily.  ? spironolactone (ALDACTONE) 25 MG tablet Take 1 tablet (25 mg total) by mouth daily.  ? venlafaxine XR (EFFEXOR-XR) 75 MG 24 hr capsule Take 75 mg by mouth in the morning, at noon, and at bedtime.  ? zolpidem (AMBIEN) 10 MG tablet Take 5-10 mg by mouth at bedtime.  ? ? ?Allergies:  ?Allergies  ?Allergen Reactions  ? Sulfa Antibiotics Itching  ? Erythromycin Hives  ? Sulfamethoxazole-Trimethoprim Hives  ? Tramadol Rash and Other (See Comments)  ?  Urinary retention ? ?  ? Tape Rash   ? ? ?Social History  ? ?Socioeconomic History  ? Marital status: Single  ?  Spouse name: Not on file  ? Number of children: Not on file  ? Years of education: 28  ? Highest education level: Not on file  ?Occupational History  ? Not on file  ?Tobacco Use  ? Smoking status: Former  ?  Packs/day: 0.50  ?  Types: Cigarettes  ?  Quit date: 01/15/2021  ?  Years since quitting: 0.4  ? Smokeless tobacco: Never  ?Vaping Use  ? Vaping Use: Never used  ?Substance and Sexual Activity  ? Alcohol use: Yes  ?  Comment: rarely  ? Drug use: Never  ? Sexual activity: Not on file  ?Other Topics Concern  ? Not on file  ?Social History Narrative  ? Not on file  ? ?Social Determinants of Health  ? ?Financial Resource Strain: Medium Risk  ? Difficulty of Paying Living Expenses: Somewhat hard  ?Food Insecurity: No Food Insecurity  ? Worried About Charity fundraiser in the Last Year: Never true  ? Ran Out of Food in the Last Year: Never true  ?Transportation Needs: No Transportation Needs  ? Lack of Transportation (Medical): No  ? Lack of Transportation (Non-Medical): No  ?Physical Activity: Not on file  ?Stress: Not on file  ?Social Connections: Not on file  ?Intimate Partner Violence: Not on file  ?  ? ?Family History  ?Problem Relation Age of Onset  ? Stroke Mother   ? Atrial fibrillation Mother   ? Heart failure Father   ?  ? ?ROS:  Please see the history of present illness.     All other systems reviewed and negative.  ? ? ?Physical Exam: ?Blood pressure 92/62, pulse 74, height 5' 10.5" (1.791 m), weight 202 lb 3.2 oz (91.7 kg), SpO2 96 %. ?General: Well developed, well nourished female in no acute distress. ?Head: Normocephalic, atraumatic, sclera non-icteric, no xanthomas, nares are without discharge. ?EENT: normal  ?Lymph Nodes:  none ?Neck: Negative for carotid bruits. JVD not elevated. ?Back:without scoliosis kyphosis ?Lungs: Clear bilaterally to auscultation without wheezes, rales, or rhonchi. Breathing is unlabored. ?Heart: RRR  with S1 S2. No murmur . No rubs, or gallops appreciated. ?Abdomen: Soft, non-tender, non-distended with normoactive bowel sounds. No hepatomegaly. No rebound/guarding. No obvious abdominal masses. ?Msk:  Strength and tone appear normal for age. ?Extremities: No clubbing or cyanosis. No  edema.  Distal pedal pulses are 2+ and equal bilaterally. ?Skin: Warm and Dry ?Neuro: Alert and oriented X 3. CN III-XII intact Grossly normal sensory and motor function . ?Psych:  Responds to questions appropriately with a normal affect. ?  ?  ?  ? ?  EKG: Sinus at 74 ?Interval 16/10/40 ?Q waves 2 3 and F in V1-V4 ? ? ?Assessment and Plan:  ?Ischemic/nonischemic cardiomyopathy ? ?Congestive heart failure-class II ? ?Low blood pressure ? ?Polycythemia ? ? ?The patient has a complicated cardiomyopathy with isolated known coronary disease but infarcts diffusely raising the question in my mind as to the mechanism of her cardiomyopathy and wondering whether it is multifactorial.  There is some family history.  With the diffuse scarring, and the isolated mid LAD lesion could there have been spasm?  We will review this with Dr. Reine Just.  This might have some impact on medications. ? ?She has persistent left ventricular dysfunction and in the context of an ischemic cardiomyopathy or even a nonischemic myopathy, she is appropriately considered for ICD implantation for the reduction of risk of sudden death.  We have reviewed the potential benefits as well as potential risks of the procedure including but not limited to death perforation infection lead dislodgment and inappropriate shock.  She understands these risks and would like to proceed.  We also discussed transvenous versus subcutaneous devices and she is elected to proceed with the former ? ?Struck by her polycythemia.  We will check erythropoietin  level ? ?She is euvolemic and functionally doing quite well.  I am a little bit struck by her orthopnea.  She does have mild sleep apnea measured  sitting up, I wonder if she would be a candidate for an oral appliance. ? ? ? ?Virl Axe  ?

## 2021-07-12 NOTE — Progress Notes (Addendum)
Okay ? ? ? ? ?ELECTROPHYSIOLOGY CONSULT NOTE  ?Patient ID: Christine [name], MRN: [id number], DOB/AGE: 05-28-1965 56 y.o. ?Admit date: (Not on file) ?Date of Consult: 07/12/2021 ? ?Primary Physician: Ladell Pier, MD ?Primary Cardiologist: DB ?  ?  ?Christine [name] is a 56 y.o. female who is being seen today for the evaluation of ICD at the request of DB.  ? ? ?HPI ?Christine [name] is a 56 y.o. female referred for an ICD.  Presented 10/22 with shortness of breath and tachycardia.  Found to have severe left ventricular dysfunction and underwent catheterization demonstrating single-vessel coronary disease with an occluded mid LAD.  cMRI is as below. ? ?She feels great mostly.  She has no nocturnal dyspnea edema or chest pain.  Denies dyspnea on exertion but has orthopnea so that she is uncomfortable sleeping flat.  CPX was remarkably good with VO2 max of over 18. ? ?She has palpitations that are annoying most mornings.  Last 15-30 minutes.  Reviewed the event recorder with her from the fall; all of the triggered events were associated with sinus rhythm mostly not with tachycardia. ? ?DATE TEST EF   ?10/22/  Echo   20% % [place]ural thrombus ?MR mod  ?10/22 LHC  LAD:m-100  ? cMRI 22% LGE consistent with MI  ?1/23 Echo  20%    ? ?Date Cr K Hgb  ?10/22   12.9  ?3/13 0.84 3.6 16.3  ?      ? ?Event recorder personally reviewed.  11/22.  VT-nonsustained; sinus average 95 ? ? ?Past Medical History:  ?Diagnosis Date  ? Anxiety 01/20/2021  ? CHF (congestive heart failure) ([place])   ? Shortness of breath 01/20/2021  ? Tobacco abuse 01/20/2021  ?   ? ?Surgical History:  ?Past Surgical History:  ?Procedure Laterality Date  ? ABDOMINAL HYSTERECTOMY    ? CARDIAC CATHETERIZATION    ? CHOLECYSTECTOMY    ? NECK SURGERY    ? OVARIAN CYST SURGERY    ? RIGHT/LEFT HEART CATH AND CORONARY ANGIOGRAPHY N/A 01/23/2021  ? Procedure: RIGHT/LEFT HEART CATH AND CORONARY ANGIOGRAPHY;  Surgeon: Jolaine Artist, MD;  Location: [facility];   Service: Cardiovascular;  Laterality: N/A;  ?  ? ?Home Meds: ?Current Meds  ?Medication Sig  ? acetaminophen (TYLENOL) 500 MG tablet Take 500-1,000 mg by mouth every 6 (six) hours as needed.  ? ALPRAZolam (XANAX) 1 MG tablet Take 2 mg by mouth 3 (three) times daily.  ? apixaban (ELIQUIS) 5 MG TABS tablet Take 1 tablet (5 mg total) by mouth 2 (two) times daily.  ? atorvastatin (LIPITOR) 80 MG tablet Take 1 tablet (80 mg total) by mouth daily.  ? cyclobenzaprine (FLEXERIL) 10 MG tablet Take 20 mg by mouth at bedtime.  ? dapagliflozin propanediol (FARXIGA) 10 MG TABS tablet Take 1 tablet (10 mg total) by mouth daily.  ? digoxin (LANOXIN) 0.125 MG tablet Take 1 tablet (0.125 mg total) by mouth daily.  ? furosemide (LASIX) 20 MG tablet TAKE 3 TABLETS (60 MG TOTAL) BY MOUTH 2 (TWO) TIMES DAILY.  ? ivabradine (CORLANOR) 5 MG TABS tablet Take 1 tablet (5 mg total) by mouth 2 (two) times daily with a meal.  ? losartan (COZAAR) 25 MG tablet Take 25 mg by mouth daily. Pt takes 1/2 table 12.5 mg once daily.  ? metoprolol succinate (TOPROL-XL) 25 MG 24 hr tablet Take 1 tablet (25 mg total) by mouth daily. Take with or immediately following a meal.  ? metroNIDAZOLE (METROGEL) 1 %  gel Apply 1 application topically daily.  ? oxymetazoline (AFRIN) 0.05 % nasal spray Place 2 sprays into both nostrils 2 (two) times daily as needed for congestion.  ? potassium chloride SA (KLOR-CON M) 20 MEQ tablet Take 2 tablets (40 mEq total) by mouth 2 (two) times daily.  ? spironolactone (ALDACTONE) 25 MG tablet Take 1 tablet (25 mg total) by mouth daily.  ? venlafaxine XR (EFFEXOR-XR) 75 MG 24 hr capsule Take 75 mg by mouth in the morning, at noon, and at bedtime.  ? zolpidem (AMBIEN) 10 MG tablet Take 5-10 mg by mouth at bedtime.  ? ? ?Allergies:  ?Allergies  ?Allergen Reactions  ? Sulfa Antibiotics Itching  ? Erythromycin Hives  ? Sulfamethoxazole-Trimethoprim Hives  ? Tramadol Rash and Other (See Comments)  ?  Urinary retention ? ?  ? Tape Rash   ? ? ?Social History  ? ?Socioeconomic History  ? Marital status: Single  ?  Spouse name: Not on file  ? Number of children: Not on file  ? Years of education: 28  ? Highest education level: Not on file  ?Occupational History  ? Not on file  ?Tobacco Use  ? Smoking status: Former  ?  Packs/day: 0.50  ?  Types: Cigarettes  ?  Quit date: 01/15/2021  ?  Years since quitting: 0.4  ? Smokeless tobacco: Never  ?Vaping Use  ? Vaping Use: Never used  ?Substance and Sexual Activity  ? Alcohol use: Yes  ?  Comment: rarely  ? Drug use: Never  ? Sexual activity: Not on file  ?Other Topics Concern  ? Not on file  ?Social History Narrative  ? Not on file  ? ?Social Determinants of Health  ? ?Financial Resource Strain: Medium Risk  ? Difficulty of Paying Living Expenses: Somewhat hard  ?Food Insecurity: No Food Insecurity  ? Worried About Charity fundraiser in the Last Year: Never true  ? Ran Out of Food in the Last Year: Never true  ?Transportation Needs: No Transportation Needs  ? Lack of Transportation (Medical): No  ? Lack of Transportation (Non-Medical): No  ?Physical Activity: Not on file  ?Stress: Not on file  ?Social Connections: Not on file  ?Intimate Partner Violence: Not on file  ?  ? ?Family History  ?Problem Relation Age of Onset  ? Stroke Mother   ? Atrial fibrillation Mother   ? Heart failure Father   ?  ? ?ROS:  Please see the history of present illness.     All other systems reviewed and negative.  ? ? ?Physical Exam: ?Blood pressure 92/62, pulse 74, height 5' 10.5" (1.791 m), weight 202 lb 3.2 oz (91.7 kg), SpO2 96 %. ?General: Well developed, well nourished female in no acute distress. ?Head: Normocephalic, atraumatic, sclera non-icteric, no xanthomas, nares are without discharge. ?EENT: normal  ?Lymph Nodes:  none ?Neck: Negative for carotid bruits. JVD not elevated. ?Back:without scoliosis kyphosis ?Lungs: Clear bilaterally to auscultation without wheezes, rales, or rhonchi. Breathing is unlabored. ?Heart: RRR  with S1 S2. No murmur . No rubs, or gallops appreciated. ?Abdomen: Soft, non-tender, non-distended with normoactive bowel sounds. No hepatomegaly. No rebound/guarding. No obvious abdominal masses. ?Msk:  Strength and tone appear normal for age. ?Extremities: No clubbing or cyanosis. No  edema.  Distal pedal pulses are 2+ and equal bilaterally. ?Skin: Warm and Dry ?Neuro: Alert and oriented X 3. CN III-XII intact Grossly normal sensory and motor function . ?Psych:  Responds to questions appropriately with a normal affect. ?  ?  ?  ? ?  EKG: Sinus at 74 ?Interval 16/10/40 ?Q waves 2 3 and F in V1-V4 ? ? ?Assessment and Plan:  ?Ischemic/nonischemic cardiomyopathy ? ?Congestive heart failure-class II ? ?Low blood pressure ? ?Polycythemia ? ? ?The patient has a complicated cardiomyopathy with isolated known coronary disease but infarcts diffusely raising the question in my mind as to the mechanism of her cardiomyopathy and wondering whether it is multifactorial.  There is some family history.  With the diffuse scarring, and the isolated mid LAD lesion could there have been spasm?  We will review this with Dr. Reine Just.  This might have some impact on medications. ? ?She has persistent left ventricular dysfunction and in the context of an ischemic cardiomyopathy or even a nonischemic myopathy, she is appropriately considered for ICD implantation for the reduction of risk of sudden death.  We have reviewed the potential benefits as well as potential risks of the procedure including but not limited to death perforation infection lead dislodgment and inappropriate shock.  She understands these risks and would like to proceed.  We also discussed transvenous versus subcutaneous devices and she is elected to proceed with the former ? ?Struck by her polycythemia.  We will check erythropoietin  level ? ?She is euvolemic and functionally doing quite well.  I am a little bit struck by her orthopnea.  She does have mild sleep apnea measured  sitting up, I wonder if she would be a candidate for an oral appliance. ? ? ? ?Virl Axe  ?

## 2021-07-14 ENCOUNTER — Other Ambulatory Visit: Payer: 59 | Admitting: *Deleted

## 2021-07-14 DIAGNOSIS — I5022 Chronic systolic (congestive) heart failure: Secondary | ICD-10-CM

## 2021-07-15 DIAGNOSIS — Z95810 Presence of automatic (implantable) cardiac defibrillator: Secondary | ICD-10-CM

## 2021-07-15 DIAGNOSIS — G4730 Sleep apnea, unspecified: Secondary | ICD-10-CM

## 2021-07-15 DIAGNOSIS — Z419 Encounter for procedure for purposes other than remedying health state, unspecified: Secondary | ICD-10-CM | POA: Diagnosis not present

## 2021-07-15 HISTORY — DX: Presence of automatic (implantable) cardiac defibrillator: Z95.810

## 2021-07-15 HISTORY — DX: Sleep apnea, unspecified: G47.30

## 2021-07-15 LAB — BASIC METABOLIC PANEL
BUN/Creatinine Ratio: 11 (ref 9–23)
BUN: 10 mg/dL (ref 6–24)
CO2: 28 mmol/L (ref 20–29)
Calcium: 9.4 mg/dL (ref 8.7–10.2)
Chloride: 98 mmol/L (ref 96–106)
Creatinine, Ser: 0.94 mg/dL (ref 0.57–1.00)
Glucose: 105 mg/dL — ABNORMAL HIGH (ref 70–99)
Potassium: 3.8 mmol/L (ref 3.5–5.2)
Sodium: 138 mmol/L (ref 134–144)
eGFR: 73 mL/min/{1.73_m2} (ref >59)

## 2021-07-15 LAB — CBC WITH DIFFERENTIAL/PLATELET
Basophils Absolute: 0.1 10*3/uL (ref 0.0–0.2)
Basos: 1 %
EOS (ABSOLUTE): 0.3 10*3/uL (ref 0.0–0.4)
Eos: 3 %
Hematocrit: 46.1 % (ref 34.0–46.6)
Hemoglobin: 15.3 g/dL (ref 11.1–15.9)
Immature Grans (Abs): 0 10*3/uL (ref 0.0–0.1)
Immature Granulocytes: 0 %
Lymphocytes Absolute: 1.7 10*3/uL (ref 0.7–3.1)
Lymphs: 18 %
MCH: 28.7 pg (ref 26.6–33.0)
MCHC: 33.2 g/dL (ref 31.5–35.7)
MCV: 87 fL (ref 79–97)
Monocytes Absolute: 0.6 10*3/uL (ref 0.1–0.9)
Monocytes: 7 %
Neutrophils Absolute: 6.4 10*3/uL (ref 1.4–7.0)
Neutrophils: 71 %
Platelets: 391 10*3/uL (ref 150–450)
RBC: 5.33 x10E6/uL — ABNORMAL HIGH (ref 3.77–5.28)
RDW: 12.6 % (ref 11.7–15.4)
WBC: 9 10*3/uL (ref 3.4–10.8)

## 2021-07-15 LAB — ERYTHROPOIETIN: Erythropoietin: 5.6 m[IU]/mL (ref 2.6–18.5)

## 2021-07-19 ENCOUNTER — Other Ambulatory Visit (HOSPITAL_COMMUNITY): Payer: Self-pay

## 2021-07-20 NOTE — Pre-Procedure Instructions (Signed)
Instructed patient on the following items: ?Arrival time 0530, patient agreed to come in early, procedure time was moved up ?Nothing to eat or drink after midnight ?No meds AM of procedure ?Responsible person to drive you home and stay with you for 24 hrs ?Wash with special soap night before and morning of procedure ?If on anti-coagulant drug instructions Eliquis- last dose 07/18/21  ?

## 2021-07-21 ENCOUNTER — Other Ambulatory Visit: Payer: Self-pay

## 2021-07-21 ENCOUNTER — Ambulatory Visit (HOSPITAL_COMMUNITY)
Admission: RE | Admit: 2021-07-21 | Discharge: 2021-07-21 | Disposition: A | Discharge status: Home / Self Care | Payer: 59 | Attending: Internal Medicine | Admitting: Internal Medicine

## 2021-07-21 ENCOUNTER — Ambulatory Visit (HOSPITAL_COMMUNITY): Payer: 59

## 2021-07-21 ENCOUNTER — Encounter (HOSPITAL_COMMUNITY)
Admission: RE | Disposition: A | Discharge status: Home / Self Care | Payer: 59 | Source: Home / Self Care | Attending: Internal Medicine

## 2021-07-21 DIAGNOSIS — I509 Heart failure, unspecified: Secondary | ICD-10-CM | POA: Insufficient documentation

## 2021-07-21 DIAGNOSIS — I2510 Atherosclerotic heart disease of native coronary artery without angina pectoris: Secondary | ICD-10-CM | POA: Diagnosis not present

## 2021-07-21 DIAGNOSIS — I959 Hypotension, unspecified: Secondary | ICD-10-CM | POA: Insufficient documentation

## 2021-07-21 DIAGNOSIS — I429 Cardiomyopathy, unspecified: Secondary | ICD-10-CM

## 2021-07-21 DIAGNOSIS — I428 Other cardiomyopathies: Secondary | ICD-10-CM | POA: Insufficient documentation

## 2021-07-21 DIAGNOSIS — Z596 Low income: Secondary | ICD-10-CM | POA: Diagnosis not present

## 2021-07-21 DIAGNOSIS — D751 Secondary polycythemia: Secondary | ICD-10-CM | POA: Diagnosis not present

## 2021-07-21 DIAGNOSIS — G4730 Sleep apnea, unspecified: Secondary | ICD-10-CM | POA: Insufficient documentation

## 2021-07-21 DIAGNOSIS — Z87891 Personal history of nicotine dependence: Secondary | ICD-10-CM | POA: Insufficient documentation

## 2021-07-21 HISTORY — PX: ICD IMPLANT: EP1208

## 2021-07-21 SURGERY — ICD IMPLANT

## 2021-07-21 MED ORDER — HEPARIN (PORCINE) IN NACL 1000-0.9 UT/500ML-% IV SOLN
INTRAVENOUS | Status: DC | PRN
  Administered 2021-07-21: 500 mL

## 2021-07-21 MED ORDER — HYDROCODONE-ACETAMINOPHEN 5-325 MG PO TABS
1.0000 | ORAL_TABLET | Freq: Once | ORAL | Status: AC
  Administered 2021-07-21: 1 via ORAL
  Filled 2021-07-21 (×2): qty 1

## 2021-07-21 MED ORDER — SODIUM CHLORIDE 0.9 % IV SOLN: INTRAVENOUS | Status: DC

## 2021-07-21 MED ORDER — SODIUM CHLORIDE 0.9 % IV SOLN
80.0000 mg | INTRAVENOUS | Status: AC
  Administered 2021-07-21: 80 mg
  Filled 2021-07-21: qty 2

## 2021-07-21 MED ORDER — ONDANSETRON HCL 4 MG/2ML IJ SOLN: 4.0000 mg | Freq: Four times a day (QID) | INTRAMUSCULAR | Status: DC | PRN

## 2021-07-21 MED ORDER — SODIUM CHLORIDE 0.9 % IV SOLN
INTRAVENOUS | Status: AC
  Filled 2021-07-21: qty 2

## 2021-07-21 MED ORDER — MIDAZOLAM HCL 5 MG/5ML IJ SOLN
INTRAMUSCULAR | Status: DC | PRN
  Administered 2021-07-21 (×2): 1 mg via INTRAVENOUS
  Administered 2021-07-21: 2 mg via INTRAVENOUS
  Administered 2021-07-21: 1 mg via INTRAVENOUS

## 2021-07-21 MED ORDER — CEFAZOLIN SODIUM-DEXTROSE 2-4 GM/100ML-% IV SOLN
2.0000 g | INTRAVENOUS | Status: AC
  Administered 2021-07-21: 2 g via INTRAVENOUS
  Filled 2021-07-21: qty 100

## 2021-07-21 MED ORDER — ACETAMINOPHEN 325 MG PO TABS
325.0000 mg | ORAL_TABLET | ORAL | Status: DC | PRN
  Administered 2021-07-21: 650 mg via ORAL
  Filled 2021-07-21 (×3): qty 2

## 2021-07-21 MED ORDER — MIDAZOLAM HCL 5 MG/5ML IJ SOLN
INTRAMUSCULAR | Status: AC
  Filled 2021-07-21: qty 5

## 2021-07-21 MED ORDER — LIDOCAINE HCL (PF) 1 % IJ SOLN
INTRAMUSCULAR | Status: DC | PRN
  Administered 2021-07-21: 50 mL

## 2021-07-21 MED ORDER — FENTANYL CITRATE (PF) 100 MCG/2ML IJ SOLN
INTRAMUSCULAR | Status: DC | PRN
  Administered 2021-07-21 (×3): 25 ug via INTRAVENOUS

## 2021-07-21 MED ORDER — POVIDONE-IODINE 10 % EX SWAB: 2.0000 "application " | Freq: Once | CUTANEOUS | Status: DC

## 2021-07-21 MED ORDER — CHLORHEXIDINE GLUCONATE 4 % EX LIQD
4.0000 "application " | Freq: Once | CUTANEOUS | Status: DC
  Filled 2021-07-21: qty 60

## 2021-07-21 MED ORDER — LIDOCAINE HCL 1 % IJ SOLN
INTRAMUSCULAR | Status: AC
  Filled 2021-07-21: qty 60

## 2021-07-21 MED ORDER — CEFAZOLIN SODIUM-DEXTROSE 2-4 GM/100ML-% IV SOLN
INTRAVENOUS | Status: AC
  Filled 2021-07-21: qty 100

## 2021-07-21 MED ORDER — FENTANYL CITRATE (PF) 100 MCG/2ML IJ SOLN
INTRAMUSCULAR | Status: AC
Start: 2021-07-21 — End: ?
  Filled 2021-07-21: qty 2

## 2021-07-21 SURGICAL SUPPLY — 6 items
CABLE SURGICAL S-101-97-12 (CABLE) ×3 IMPLANT
ICD MOMENTUM D120 (ICD Generator) ×1 IMPLANT
LEAD RELIANCE 0138-64 (Lead) ×1 IMPLANT
PAD DEFIB RADIO PHYSIO CONN (PAD) ×3 IMPLANT
SHEATH 9.5FR PRELUDE SNAP 13 (SHEATH) ×1 IMPLANT
TRAY PACEMAKER INSERTION (PACKS) ×3 IMPLANT

## 2021-07-21 NOTE — Interval H&P Note (Signed)
History and Physical Interval Note: ? ?07/21/2021 ?7:39 AM ? ?Christine [name]  has presented today for surgery, with the diagnosis of cardiomyopathy.  The various methods of treatment have been discussed with the patient and family. After consideration of risks, benefits and other options for treatment, the patient has consented to  Procedure(s): ?ICD IMPLANT (N/A) as a surgical intervention.  The patient's history has been reviewed, patient examined, no change in status, stable for surgery.  I have reviewed the patient's chart and labs.  Questions were answered to the patient's satisfaction.   ? ? ?Virl Axe ? ? ?

## 2021-07-21 NOTE — Progress Notes (Addendum)
?  Pt for continued surgical site pain, minimal response to tylenol. Reports stable site. 6/10 pain.  ? ?Per chart review reaction to Tramadol includes "urinary retention"  ? ?Discussed with Dr. Caryl Comes. Will give Vicodin 5/325 x 1 and re-assess pain and appropriateness for outpatient prescription prior to discharge.  ? ?Lollie Marrow, PA-C  ?07/21/2021 11:33 AM  ?

## 2021-07-21 NOTE — Progress Notes (Signed)
Client c/o 6/10 surgical site pain, states needs something besides tylenol; Paged Dr Caryl Comes ?

## 2021-07-21 NOTE — Progress Notes (Signed)
Dr Caryl Comes in and ok to d/c home ?

## 2021-07-21 NOTE — Discharge Instructions (Signed)
After Your ICD ?(Implantable Cardiac Defibrillator) ? ? ?You have a [place] ? ?ACTIVITY ?Do not lift your arm above shoulder height for 1 week after your procedure. After 7 days, you may progress as below.  ?You should remove your sling 24 hours after your procedure, unless otherwise instructed by your provider.  ? ? ? Friday July 28, 2021  Saturday July 29, 2021 Sunday July 30, 2021 Monday July 31, 2021  ? ?Do not lift, push, pull, or carry anything over 10 pounds with the affected arm until 6 weeks (Friday Sep 01, 2021 ) after your procedure.  ? ?You may drive AFTER your wound check, unless you have been told otherwise by your provider.  ? ?Ask your healthcare provider when you can go back to work ? ? ?INCISION/Dressing ?If you are on a blood thinner such as Coumadin, Xarelto, Eliquis, Plavix, or Pradaxa please confirm with your provider when this should be resumed. 07/24/21 ? ?If large square, outer bandage is left in place, this can be removed after 24 hours from your procedure. Do not remove steri-strips or glue as below.  ? ?Monitor your defibrillator site for redness, swelling, and drainage. Call the device clinic at [phone] if you experience these symptoms or fever/chills. ? ?If your incision is closed with Dermabond/Surgical glue. You may shower 1 day after your pacemaker implant and wash around the site with soap and water.   ? ?If you were discharged in a sling, please do not wear this during the day more than 48 hours after your surgery unless otherwise instructed. This may increase the risk of stiffness and soreness in your shoulder.  ? ?Avoid lotions, ointments, or perfumes over your incision until it is well-healed. ? ?You may use a hot tub or a pool AFTER your wound check appointment if the incision is completely closed. ? ?Your ICD is designed to protect you from life threatening heart rhythms. Because of this, you may receive a shock.  ? ?1 shock with no symptoms:  Call the  office during business hours. ?1 shock with symptoms (chest pain, chest pressure, dizziness, lightheadedness, shortness of breath, overall feeling unwell):  Call 911. ?If you experience 2 or more shocks in 24 hours:  Call 911. ?If you receive a shock, you should not drive for 6 months per the [place] DMV IF you receive appropriate therapy from your ICD.  ? ?ICD Alerts:  Some alerts are vibratory and others beep. These are NOT emergencies. Please call our office to let us know. If this occurs at night or on weekends, it can wait until the next business day. Send a remote transmission. ? ?If your device is capable of reading fluid status (for heart failure), you will be offered monthly monitoring to review this with you.  ? ?DEVICE MANAGEMENT ?Remote monitoring is used to monitor your ICD from home. This monitoring is scheduled every 91 days by our office. It allows [place] to keep an eye on the functioning of your device to ensure it is working properly. You will routinely see your Electrophysiologist annually (more often if necessary).  ? ?You should receive your ID card for your new device in 4-8 weeks. Keep this card with you at all times once received. Consider wearing a medical alert bracelet or necklace. ? ?Your ICD  may be MRI compatible. This will be discussed at your next office visit/wound check.  You should avoid contact with strong electric or magnetic fields.  ? ?Do not use amateur (  ham) radio equipment or electric (arc) welding torches. MP3 player headphones with magnets should not be used. Some devices are safe to use if held at least 12 inches (30 cm) from your defibrillator. These include power tools, lawn mowers, and speakers. If you are unsure if something is safe to use, ask your health care provider. ? ?When using your cell phone, hold it to the ear that is on the opposite side from the defibrillator. Do not leave your cell phone in a pocket over the defibrillator. ? ?You may safely use electric blankets,  heating pads, computers, and microwave ovens. ? ?Call the office right away if: ?You have chest pain. ?You feel more than one shock. ?You feel more short of breath than you have felt before. ?You feel more light-headed than you have felt before. ?Your incision starts to open up. ? ?This information is not intended to replace advice given to you by your health care provider. Make sure you discuss any questions you have with your health care provider. ? ? ?  ?

## 2021-07-22 ENCOUNTER — Other Ambulatory Visit: Payer: Self-pay | Admitting: Physician Assistant

## 2021-07-22 ENCOUNTER — Telehealth: Payer: Self-pay | Admitting: Physician Assistant

## 2021-07-22 MED ORDER — HYDROCODONE-ACETAMINOPHEN 5-325 MG PO TABS: 1.0000 | ORAL_TABLET | ORAL | 0 refills | Status: DC | PRN

## 2021-07-22 NOTE — Telephone Encounter (Signed)
Spoke with Dr. Curt Bears EP attending, will prescribe 6 tablets of Norco 5/325 mg to help control the pain.  This should get her through the weekend.  If pain still persist on Monday, she has been instructed to call Dr. Olin Pia office. ?

## 2021-07-22 NOTE — Telephone Encounter (Signed)
Patient just underwent ICD implantation 2 days ago and was discharged yesterday.  Prior to discharge, she had significant pain near the surgical site and was given a dose of Vicodin 5/325.  Per EP PAs note, plan to reassess pain and appropriateness for outpatient prescription prior to discharge.  Patient was eventually instructed to take over-the-counter Tylenol for pain control.  According to the patient, she did not sleep last night.  She was taking 500 mg extra strength Tylenol every 4 hours without significant pain control.  Currently she has 8 out of 10 pain at the surgical site.  There is some mild postsurgical redness but there is was no redness spreading out from the surgical site.  There was no fever or chill.  She does not have any shortness of breath, chest pain or dizziness. ? ?We will reach out to EP attending to see if they want to release the 5-day course of as needed Vicodin for her pain control. ?

## 2021-07-24 ENCOUNTER — Telehealth: Payer: Self-pay

## 2021-07-24 ENCOUNTER — Encounter (HOSPITAL_COMMUNITY): Payer: Self-pay | Admitting: Internal Medicine

## 2021-07-24 ENCOUNTER — Telehealth: Payer: Self-pay | Admitting: Internal Medicine

## 2021-07-24 NOTE — Telephone Encounter (Signed)
-----   Message from Shirley Friar, PA-C sent at 07/21/2021 12:17 PM EDT ----- ? ?Same day ICD SK 4/7 ? ?

## 2021-07-24 NOTE — Telephone Encounter (Signed)
? ?  Pt said, she had her implant recently and today she noticed that there's a bubble below where her incision is. She said the bubble was not there yesterday and she is a little concern ?

## 2021-07-24 NOTE — Telephone Encounter (Signed)
Returning phone call. Phone went straight to VM. No answer, LMTCB. ?

## 2021-07-24 NOTE — Telephone Encounter (Signed)
Attempted to complete same day discharge. No answer, LMTCB. ? ?Patient also called stating she her a bubble appear at her incision today ~ was not there yesterday. Dr. Caryl Comes implanted and used dermabond, very likely dermabond. Called patient to discuss in separate phone note, phone goes straight to VM. LMTCB. ?

## 2021-07-25 NOTE — Telephone Encounter (Signed)
Follow-up after same day discharge: ?Implant date: 07/21/21 ?MD: Virl Axe, MD ?Device: Lynn chamber ICD ? ?Location: left ? ? ?Wound check visit: 08/02/21 9:20 ?90 day MD follow-up: 10/26/21 1:45 ? ?Remote Transmission received:yes ? ?Dressing removed: n/a ? ?Patient states she is doing well however "called Saturday" for pain medications as she could not sleep Friday night. She has removed the sling. Dermabond intact. Patient states a "blister" has formed at distal end of incision. She is provided Chief of Staff and will email picture of site. Denies bruising, bleeding, or drainage. Appointments confirmed as above. Device contact given for additional questions or concerns that may arise. Will await email.   ?

## 2021-07-25 NOTE — Telephone Encounter (Signed)
Patient called returning call. I have let patient know the nurse is with a patient and will return her call ?

## 2021-07-25 NOTE — Telephone Encounter (Signed)
Emailed picture received. Unable to determine if blister is present or if it is dermabond. Patient is advised to continue to monitor and call device clinic if area should change for the worse.  ? ? ?

## 2021-07-26 ENCOUNTER — Other Ambulatory Visit (HOSPITAL_COMMUNITY): Payer: Self-pay

## 2021-07-26 ENCOUNTER — Other Ambulatory Visit (HOSPITAL_COMMUNITY): Payer: Self-pay | Admitting: Family Medicine

## 2021-07-26 MED ORDER — POTASSIUM CHLORIDE CRYS ER 20 MEQ PO TBCR
40.0000 meq | EXTENDED_RELEASE_TABLET | Freq: Two times a day (BID) | ORAL | 3 refills | Status: DC
  Filled 2021-07-26: qty 120, 30d supply, fill #0
  Filled 2021-08-23: qty 120, 30d supply, fill #1

## 2021-08-02 ENCOUNTER — Ambulatory Visit (INDEPENDENT_AMBULATORY_CARE_PROVIDER_SITE_OTHER): Payer: 59

## 2021-08-02 DIAGNOSIS — I5022 Chronic systolic (congestive) heart failure: Secondary | ICD-10-CM | POA: Diagnosis not present

## 2021-08-02 LAB — CUP PACEART INCLINIC DEVICE CHECK
Date Time Interrogation Session: 20230419154816
HighPow Impedance: 80 Ohm
Implantable Lead Implant Date: 20230407
Implantable Lead Location: 753860
Implantable Lead Model: 138
Implantable Lead Serial Number: 303166
Implantable Pulse Generator Implant Date: 20230407
Lead Channel Impedance Value: 754 Ohm
Lead Channel Pacing Threshold Amplitude: 0.9 V
Lead Channel Pacing Threshold Pulse Width: 0.4 ms
Lead Channel Sensing Intrinsic Amplitude: 21.2 mV
Lead Channel Setting Pacing Amplitude: 3.5 V
Lead Channel Setting Pacing Pulse Width: 0.4 ms
Lead Channel Setting Sensing Sensitivity: 0.5 mV
Pulse Gen Serial Number: 216478

## 2021-08-02 NOTE — Patient Instructions (Signed)
? ?  After Your ICD ?(Implantable Cardiac Defibrillator) ? ? ? ?Monitor your defibrillator site for redness, swelling, and drainage. Call the device clinic at [phone] if you experience these symptoms or fever/chills. ? ?Your incision was closed with Dermabond:  You may shower 1 day after your defibrillator implant and wash your incision with soap and water. Avoid lotions, ointments, or perfumes over your incision until it is well-healed. ? ?You may use a hot tub or a pool after your wound check appointment if the incision is completely closed. ? ?Do not lift, push or pull greater than 10 pounds with the affected arm until 6 weeks after your procedure. There are no other restrictions in arm movement after your wound check appointment. ? ?Your ICD is designed to protect you from life threatening heart rhythms. Because of this, you may receive a shock.  ? ?1 shock with no symptoms:  Call the office during business hours. ?1 shock with symptoms (chest pain, chest pressure, dizziness, lightheadedness, shortness of breath, overall feeling unwell):  Call 911. ?If you experience 2 or more shocks in 24 hours:  Call 911. ?If you receive a shock, you should not drive.  ?[place] DMV - no driving for 6 months if you receive appropriate therapy from your ICD.  ? ?ICD Alerts:  Some alerts are vibratory and others beep. These are NOT emergencies. Please call our office to let us know. If this occurs at night or on weekends, it can wait until the next business day. Send a remote transmission. ? ?If your device is capable of reading fluid status (for heart failure), you will be offered monthly monitoring to review this with you.  ? ?Remote monitoring is used to monitor your ICD from home. This monitoring is scheduled every 91 days by our office. It allows [place] to keep an eye on the functioning of your device to ensure it is working properly. You will routinely see your Electrophysiologist annually (more often if necessary).  ?

## 2021-08-02 NOTE — Progress Notes (Signed)
Wound check appointment. Steri-strips removed. Wound without redness or edema. Incision edges approximated, wound well healed. Normal device function. Thresholds, sensing, and impedances consistent with implant measurements. Device programmed at 3.5V for extra safety margin until 3 month visit. Histogram distribution appropriate for patient and level of activity. No ventricular arrhythmias noted. Patient educated about wound care, arm mobility, lifting restrictions, shock plan. ROV in 3 months with implanting physician. ?

## 2021-08-07 ENCOUNTER — Other Ambulatory Visit (HOSPITAL_COMMUNITY): Payer: Self-pay

## 2021-08-09 ENCOUNTER — Ambulatory Visit: Payer: Self-pay | Admitting: *Deleted

## 2021-08-09 ENCOUNTER — Encounter (HOSPITAL_COMMUNITY): Payer: Self-pay

## 2021-08-09 NOTE — Telephone Encounter (Signed)
?  Chief Complaint: shallow breathing at times , dizziness hx defibrillator ?Symptoms: dizziness, shallow breathing , chills last night , HR 85 , has had heart feel like pounding , but not now. BP 101/71. Took covid test negative x 2 today  ?Frequency: last night  ?Pertinent Negatives: Patient denies chest pain , difficulty breathing, no fever, no other sx ?Disposition: [] ED /[] Urgent Care (no appt availability in office) / [] Appointment(In office/virtual)/ []  [facility]/ [] Home Care/ [] Refused Recommended Disposition /[x]  Mobile [facility]/ [facility], [place] []  Follow-up with PCP ?Additional Notes:  ? ?Recommended to go to ED if symptoms come back or worsen call 911 due to recent placement of defibrillator on 07/21/21. ? ? ? Reason for Disposition ? [1] MILD difficulty breathing (e.g., minimal/no SOB at rest, SOB with walking, pulse <100) AND [2] NEW-onset or WORSE than normal ?   Reports shallow breathing when taking deep breath in ? ?Answer Assessment - Initial Assessment Questions ?1. RESPIRATORY STATUS: "Describe your breathing?" (e.g., wheezing, shortness of breath, unable to speak, severe coughing)  ?    Shallow breathing  ?2. ONSET: "When did this breathing problem begin?"  ?    Last night  ?3. PATTERN "Does the difficult breathing come and go, or has it been constant since it started?"  ?    Comes and goes ?4. SEVERITY: "How bad is your breathing?" (e.g., mild, moderate, severe)  ?  - MILD: No SOB at rest, mild SOB with walking, speaks normally in sentences, can lie down, no retractions, pulse < 100.  ?  - MODERATE: SOB at rest, SOB with minimal exertion and prefers to sit, cannot lie down flat, speaks in phrases, mild retractions, audible wheezing, pulse 100-120.  ?  - SEVERE: Very SOB at rest, speaks in single words, struggling to breathe, sitting hunched forward, retractions, pulse > 120  ?    Mild shallow breathing , chills, dizziness  ?5. RECURRENT SYMPTOM: "Have you had difficulty  breathing before?" If Yes, ask: "When was the last time?" and "What happened that time?"  ?    no ?6. CARDIAC HISTORY: "Do you have any history of heart disease?" (e.g., heart attack, angina, bypass surgery, angioplasty)  ?    Yes hx defibrillator 07/21/21 ?7. LUNG HISTORY: "Do you have any history of lung disease?"  (e.g., pulmonary embolus, asthma, emphysema) ?    na ?8. CAUSE: "What do you think is causing the breathing problem?"  ?    Not sure had chills last night  ?9. OTHER SYMPTOMS: "Do you have any other symptoms? (e.g., dizziness, runny nose, cough, chest pain, fever) ?    Dizziness, BP 101/71 chills last night  ?10. O2 SATURATION MONITOR:  "Do you use an oxygen saturation monitor (pulse oximeter) at home?" If Yes, "What is your reading (oxygen level) today?" "What is your usual oxygen saturation reading?" (e.g., 95%) ?      na ?11. PREGNANCY: "Is there any chance you are pregnant?" "When was your last menstrual period?" ?      na ?12. TRAVEL: "Have you traveled out of the country in the last month?" (e.g., travel history, exposures) ?      na ? ?Protocols used: Breathing Difficulty-A-AH ? ?

## 2021-08-09 NOTE — Telephone Encounter (Signed)
FYI

## 2021-08-10 ENCOUNTER — Encounter: Payer: Self-pay | Admitting: Physician Assistant

## 2021-08-10 ENCOUNTER — Ambulatory Visit (INDEPENDENT_AMBULATORY_CARE_PROVIDER_SITE_OTHER): Payer: Medicaid Other | Admitting: Physician Assistant

## 2021-08-10 VITALS — BP 102/72 | HR 94 | Temp 98.8°F | Resp 18 | Ht 70.0 in | Wt 194.0 lb

## 2021-08-10 DIAGNOSIS — I5022 Chronic systolic (congestive) heart failure: Secondary | ICD-10-CM | POA: Diagnosis not present

## 2021-08-10 DIAGNOSIS — Z95810 Presence of automatic (implantable) cardiac defibrillator: Secondary | ICD-10-CM

## 2021-08-10 DIAGNOSIS — R42 Dizziness and giddiness: Secondary | ICD-10-CM | POA: Diagnosis not present

## 2021-08-10 DIAGNOSIS — F419 Anxiety disorder, unspecified: Secondary | ICD-10-CM

## 2021-08-10 NOTE — Progress Notes (Signed)
Patient reports dizziness with standing 2 nights ago with chills and no fever. Patient denies URI Sx or exposure, ?

## 2021-08-10 NOTE — Patient Instructions (Signed)
We will call you with your lab results when they are available. ? ?I do encourage you to continue checking your blood pressure and pulse during these episodes.  I encourage you to get plenty of rest.  I strongly encourage you to follow-up with psychiatry to work on improving your sleep.  You should be getting 7 to 8 hours of sleep a night. ? ?Kennieth Rad, PA-C ?Physician Assistant ?[facility] ?[email or web address] ? ? ?Dizziness ?Dizziness is a common problem. It is a feeling of unsteadiness or light-headedness. You may feel like you are about to faint. Dizziness can lead to injury if you stumble or fall. Anyone can become dizzy, but dizziness is more common in older adults. This condition can be caused by a number of things, including medicines, dehydration, or illness. ?Follow these instructions at home: ?Eating and drinking ? ?Drink enough fluid to keep your urine pale yellow. This helps to keep you from becoming dehydrated. Try to drink more clear fluids, such as water. ?Do not drink alcohol. ?Limit your caffeine intake if told to do so by your health care provider. Check ingredients and nutrition facts to see if a food or beverage contains caffeine. ?Limit your salt (sodium) intake if told to do so by your health care provider. Check ingredients and nutrition facts to see if a food or beverage contains sodium. ?Activity ? ?Avoid making quick movements. ?Rise slowly from chairs and steady yourself until you feel okay. ?In the morning, first sit up on the side of the bed. When you feel okay, stand slowly while you hold onto something until you know that your balance is good. ?If you need to stand in one place for a long time, move your legs often. Tighten and relax the muscles in your legs while you are standing. ?Do not drive or use machinery if you feel dizzy. ?Avoid bending down if you feel dizzy. Place items in your home so that they are easy for you  to reach without leaning over. ?Lifestyle ?Do not use any products that contain nicotine or tobacco. These products include cigarettes, chewing tobacco, and vaping devices, such as e-cigarettes. If you need help quitting, ask your health care provider. ?Try to reduce your stress level by using methods such as yoga or meditation. Talk with your health care provider if you need help to manage your stress. ?General instructions ?Watch your dizziness for any changes. ?Take over-the-counter and prescription medicines only as told by your health care provider. Talk with your health care provider if you think that your dizziness is caused by a medicine that you are taking. ?Tell a friend or a family member that you are feeling dizzy. If he or she notices any changes in your behavior, have this person call your health care provider. ?Keep all follow-up visits. This is important. ?Contact a health care provider if: ?Your dizziness does not go away or you have new symptoms. ?Your dizziness or light-headedness gets worse. ?You feel nauseous. ?You have reduced hearing. ?You have a fever. ?You have neck pain or a stiff neck. ?Your dizziness leads to an injury or a fall. ?Get help right away if: ?You vomit or have diarrhea and are unable to eat or drink anything. ?You have problems talking, walking, swallowing, or using your arms, hands, or legs. ?You feel generally weak. ?You have any bleeding. ?You are not thinking clearly or you have trouble forming sentences. It may take a friend or family member to notice this. ?You have  chest pain, abdominal pain, shortness of breath, or sweating. ?Your vision changes or you develop a severe headache. ?These symptoms may represent a serious problem that is an emergency. Do not wait to see if the symptoms will go away. Get medical help right away. Call your local emergency services (911 in the [place] Do not drive yourself to the hospital. ?Summary ?Dizziness is a feeling of unsteadiness or  light-headedness. This condition can be caused by a number of things, including medicines, dehydration, or illness. ?Anyone can become dizzy, but dizziness is more common in older adults. ?Drink enough fluid to keep your urine pale yellow. Do not drink alcohol. ?Avoid making quick movements if you feel dizzy. Monitor your dizziness for any changes. ?This information is not intended to replace advice given to you by your health care provider. Make sure you discuss any questions you have with your health care provider. ?Document Revised: 03/07/2020 Document Reviewed: 03/07/2020 ?[employer]vier Patient Education ? [employer]. ? ?

## 2021-08-10 NOTE — Progress Notes (Signed)
? ?Established Patient Office Visit ? ?Subjective   ?Patient ID: Christine [name], female    DOB: 1965/08/04  Age: 56 y.o. MRN: [id number] ? ?Chief Complaint  ?Patient presents with  ? Dizziness  ? ? ?States that she started having some episodes of dizziness on standing and when walking around last night.  States that she also began experiencing chills.  States that that is unusual for her, states that she has felt that her "feet are freezing".  States that she feels she is having a difficult time getting a "deep enough breath" states that she feels very fatigued as well.  States that she has tried taking Tylenol without relief.  States that she took 2 home COVID test yesterday that were both negative.  Denies any sick contacts. ? ?States that she checks her blood pressure during these events dizziness and states that her readings were very similar to today's reading. ? ?Does endorse a significant history of having a defibrillator placed a couple of weeks ago.  Also endorses that she has difficulty falling asleep due to racing thoughts, states that she has been taking 10 mg of Ambien and Flexeril without relief.  States that she is prescribed this by psychiatry.  States that she was also diagnosed with sleep apnea and is going to pick up her CPAP later today. ? ? ? ?  08/10/2021  ? 10:12 AM 06/07/2021  ?  8:45 AM 03/23/2021  ?  1:49 PM 02/06/2021  ?  4:34 PM  ?Depression screen PHQ 2/9  ?Decreased Interest 0 0 0 0  ?Down, Depressed, Hopeless 0 0 0 1  ?PHQ - 2 Score 0 0 0 1  ?Altered sleeping 2     ?Tired, decreased energy 1     ?Change in appetite 0     ?Feeling bad or failure about yourself  0     ?Trouble concentrating 1     ?Moving slowly or fidgety/restless 1     ?Suicidal thoughts 0     ?PHQ-9 Score 5     ? ? ?  08/10/2021  ? 10:12 AM 02/06/2021  ?  4:34 PM  ?GAD 7 : Generalized Anxiety Score  ?Nervous, Anxious, on Edge 3 3  ?Control/stop worrying 0 1  ?Worry too much - different things 1 1  ?Trouble relaxing 2 2   ?Restless 1 0  ?Easily annoyed or irritable 1 0  ?Afraid - awful might happen 0 0  ?Total GAD 7 Score 8 7  ? ? ? ? ?Past Medical History:  ?Diagnosis Date  ? Anxiety 01/20/2021  ? CHF (congestive heart failure) ([place])   ? Shortness of breath 01/20/2021  ? Tobacco abuse 01/20/2021  ? ?Social History  ? ?Socioeconomic History  ? Marital status: Single  ?  Spouse name: Not on file  ? Number of children: Not on file  ? Years of education: 22  ? Highest education level: Not on file  ?Occupational History  ? Not on file  ?Tobacco Use  ? Smoking status: Former  ?  Packs/day: 0.50  ?  Types: Cigarettes  ?  Quit date: 01/15/2021  ?  Years since quitting: 0.5  ? Smokeless tobacco: Never  ?Vaping Use  ? Vaping Use: Never used  ?Substance and Sexual Activity  ? Alcohol use: Yes  ?  Comment: rarely  ? Drug use: Never  ? Sexual activity: Not on file  ?Other Topics Concern  ? Not on file  ?Social History Narrative  ? Not  on file  ? ?Social Determinants of Health  ? ?Financial Resource Strain: Medium Risk  ? Difficulty of Paying Living Expenses: Somewhat hard  ?Food Insecurity: No Food Insecurity  ? Worried About Charity fundraiser in the Last Year: Never true  ? Ran Out of Food in the Last Year: Never true  ?Transportation Needs: No Transportation Needs  ? Lack of Transportation (Medical): No  ? Lack of Transportation (Non-Medical): No  ?Physical Activity: Not on file  ?Stress: Not on file  ?Social Connections: Not on file  ?Intimate Partner Violence: Not on file  ? ?Family History  ?Problem Relation Age of Onset  ? Stroke Mother   ? Atrial fibrillation Mother   ? Heart failure Father   ? ?Allergies  ?Allergen Reactions  ? Sulfa Antibiotics Hives and Itching  ? Erythromycin Hives  ? Tramadol Rash and Other (See Comments)  ?  Urinary retention ? ?  ? Tape Rash  ? ?  ? ?Review of Systems  ?Constitutional:  Positive for chills and malaise/fatigue. Negative for fever.  ?HENT:  Negative for ear pain, sinus pain and sore throat.    ?Eyes: Negative.   ?Respiratory:  Positive for shortness of breath. Negative for cough and wheezing.   ?Cardiovascular:  Negative for chest pain and leg swelling.  ?Gastrointestinal: Negative.   ?Genitourinary: Negative.   ?Musculoskeletal: Negative.   ?Skin: Negative.   ?Neurological:  Positive for dizziness and headaches. Negative for seizures, loss of consciousness and weakness.  ?Endo/Heme/Allergies: Negative.   ?Psychiatric/Behavioral:  The patient is nervous/anxious and has insomnia.   ? ?  ?Objective:  ?  ? ?BP 102/72 (BP Location: Left Arm, Patient Position: Sitting, Cuff Size: Normal)   Pulse 94   Temp 98.8 ?F (37.1 ?C) (Oral)   Resp 18   Ht 5' 10"  (1.778 m)   Wt 194 lb (88 kg)   SpO2 96%   BMI 27.84 kg/m?  ?BP Readings from Last 3 Encounters:  ?08/10/21 102/72  ?07/21/21 106/65  ?07/12/21 92/62  ? ?Wt Readings from Last 3 Encounters:  ?08/10/21 194 lb (88 kg)  ?07/21/21 197 lb (89.4 kg)  ?07/12/21 202 lb 3.2 oz (91.7 kg)  ? ?  ? ?Physical Exam ?Vitals and nursing note reviewed.  ?Constitutional:   ?   Appearance: Normal appearance.  ?HENT:  ?   Head: Normocephalic and atraumatic.  ?   Right Ear: External ear normal.  ?   Left Ear: External ear normal.  ?   Nose: Nose normal.  ?   Mouth/Throat:  ?   Mouth: Mucous membranes are moist.  ?   Pharynx: Oropharynx is clear.  ?Eyes:  ?   Extraocular Movements: Extraocular movements intact.  ?   Conjunctiva/sclera: Conjunctivae normal.  ?   Pupils: Pupils are equal, round, and reactive to light.  ?Cardiovascular:  ?   Rate and Rhythm: Normal rate and regular rhythm.  ?   Pulses: Normal pulses.  ?   Heart sounds: Normal heart sounds.  ?Pulmonary:  ?   Effort: Pulmonary effort is normal.  ?   Breath sounds: Normal breath sounds.  ?Musculoskeletal:     ?   General: Normal range of motion.  ?   Cervical back: Normal range of motion and neck supple.  ?Skin: ?   General: Skin is warm and dry.  ?Neurological:  ?   General: No focal deficit present.  ?   Mental  Status: She is alert and oriented to person, place, and  time.  ?Psychiatric:     ?   Mood and Affect: Mood normal.     ?   Behavior: Behavior normal.     ?   Thought Content: Thought content normal.     ?   Judgment: Judgment normal.  ? ? ? ?  ?Assessment & Plan:  ? ?Problem List Items Addressed This Visit   ? ?  ? Cardiovascular and Mediastinum  ? CHF (congestive heart failure) ([place])  ?  ? Other  ? Anxiety  ? S/P ICD (internal cardiac defibrillator) procedure  ? ?Other Visit Diagnoses   ? ? Dizziness and giddiness    -  Primary  ? Relevant Orders  ? CBC with Differential/Platelet  ? Comp. Metabolic Panel (12)  ? Thyroid Panel With TSH  ? ?  ?1. Dizziness and giddiness ?Patient encouraged to continue checking blood pressure and pulse during the episodes.  Patient strongly encouraged to improve sleep hygiene, follow-up with psychiatry to help improve insomnia. ? ?Patient education given on supportive care, red flags for prompt reevaluation ?- CBC with Differential/Platelet ?- Comp. Metabolic Panel (12) ?- Thyroid Panel With TSH ? ?2. S/P ICD (internal cardiac defibrillator) procedure ? ? ?3. Chronic systolic congestive heart failure ([place]) ? ? ?4. Anxiety ? ? ? ? ?I have reviewed the patient's medical history (PMH, PSH, Social History, Family History, Medications, and allergies) , and have been updated if relevant. I spent 30 minutes reviewing chart and  face to face time with patient. ? ? ? ? ?Return if symptoms worsen or fail to improve.  ? ? ?Daquann Merriott S Mayers, PA-C ? ?

## 2021-08-11 LAB — CBC WITH DIFFERENTIAL/PLATELET
Basophils Absolute: 0.1 10*3/uL (ref 0.0–0.2)
Basos: 1 %
EOS (ABSOLUTE): 0.3 10*3/uL (ref 0.0–0.4)
Eos: 2 %
Hematocrit: 50.7 % — ABNORMAL HIGH (ref 34.0–46.6)
Hemoglobin: 17.5 g/dL — ABNORMAL HIGH (ref 11.1–15.9)
Immature Grans (Abs): 0.1 10*3/uL (ref 0.0–0.1)
Immature Granulocytes: 1 %
Lymphocytes Absolute: 1.8 10*3/uL (ref 0.7–3.1)
Lymphs: 14 %
MCH: 29.5 pg (ref 26.6–33.0)
MCHC: 34.5 g/dL (ref 31.5–35.7)
MCV: 86 fL (ref 79–97)
Monocytes Absolute: 0.8 10*3/uL (ref 0.1–0.9)
Monocytes: 6 %
Neutrophils Absolute: 9.8 10*3/uL — ABNORMAL HIGH (ref 1.4–7.0)
Neutrophils: 76 %
Platelets: 374 10*3/uL (ref 150–450)
RBC: 5.93 x10E6/uL — ABNORMAL HIGH (ref 3.77–5.28)
RDW: 13.1 % (ref 11.7–15.4)
WBC: 12.7 10*3/uL — ABNORMAL HIGH (ref 3.4–10.8)

## 2021-08-11 LAB — COMP. METABOLIC PANEL (12)
AST: 21 IU/L (ref 0–40)
Albumin/Globulin Ratio: 1.7 (ref 1.2–2.2)
Albumin: 4.9 g/dL (ref 3.8–4.9)
Alkaline Phosphatase: 168 IU/L — ABNORMAL HIGH (ref 44–121)
BUN/Creatinine Ratio: 15 (ref 9–23)
BUN: 14 mg/dL (ref 6–24)
Bilirubin Total: 0.4 mg/dL (ref 0.0–1.2)
Calcium: 9.7 mg/dL (ref 8.7–10.2)
Chloride: 96 mmol/L (ref 96–106)
Creatinine, Ser: 0.95 mg/dL (ref 0.57–1.00)
Globulin, Total: 2.9 g/dL (ref 1.5–4.5)
Glucose: 157 mg/dL — ABNORMAL HIGH (ref 70–99)
Potassium: 3.5 mmol/L (ref 3.5–5.2)
Sodium: 140 mmol/L (ref 134–144)
Total Protein: 7.8 g/dL (ref 6.0–8.5)
eGFR: 72 mL/min/{1.73_m2} (ref >59)

## 2021-08-11 LAB — THYROID PANEL WITH TSH
Free Thyroxine Index: 2.1 (ref 1.2–4.9)
T3 Uptake Ratio: 24 % (ref 24–39)
T4, Total: 8.8 ug/dL (ref 4.5–12.0)
TSH: 2.29 u[IU]/mL (ref 0.450–4.500)

## 2021-08-14 ENCOUNTER — Encounter (HOSPITAL_COMMUNITY): Payer: Self-pay

## 2021-08-14 DIAGNOSIS — Z419 Encounter for procedure for purposes other than remedying health state, unspecified: Secondary | ICD-10-CM | POA: Diagnosis not present

## 2021-08-17 ENCOUNTER — Telehealth: Payer: Self-pay | Admitting: *Deleted

## 2021-08-17 NOTE — Telephone Encounter (Signed)
-----   Message from Kennieth Rad, [place] sent at 08/12/2021  1:27 PM EDT ----- ?Please call patient and let her know that white blood cell count and neutrophils are slightly elevated, this may be due to her recent procedure, or she may have been experiencing a virus during her office visit.  Her kidney function and thyroid function are within normal limits.  She does have 1 liver enzyme that is elevated.  She should have this rechecked at her next office visit. ?

## 2021-08-17 NOTE — Telephone Encounter (Signed)
Patient may view results via mychart and contact the office for any questions at [phone]. ? ?

## 2021-08-18 ENCOUNTER — Other Ambulatory Visit: Payer: Self-pay

## 2021-08-18 ENCOUNTER — Encounter (HOSPITAL_COMMUNITY): Payer: Self-pay | Admitting: *Deleted

## 2021-08-18 ENCOUNTER — Emergency Department (HOSPITAL_COMMUNITY): Payer: 59

## 2021-08-18 ENCOUNTER — Emergency Department (HOSPITAL_COMMUNITY)
Admission: EM | Admit: 2021-08-18 | Discharge: 2021-08-18 | Disposition: A | Discharge status: Home / Self Care | Payer: 59 | Attending: Emergency Medicine | Admitting: Emergency Medicine

## 2021-08-18 DIAGNOSIS — I2510 Atherosclerotic heart disease of native coronary artery without angina pectoris: Secondary | ICD-10-CM

## 2021-08-18 DIAGNOSIS — I509 Heart failure, unspecified: Secondary | ICD-10-CM

## 2021-08-18 DIAGNOSIS — R0602 Shortness of breath: Secondary | ICD-10-CM | POA: Diagnosis not present

## 2021-08-18 DIAGNOSIS — R000 Tachycardia, unspecified: Secondary | ICD-10-CM | POA: Diagnosis not present

## 2021-08-18 DIAGNOSIS — Z7901 Long term (current) use of anticoagulants: Secondary | ICD-10-CM | POA: Insufficient documentation

## 2021-08-18 DIAGNOSIS — R531 Weakness: Secondary | ICD-10-CM | POA: Diagnosis not present

## 2021-08-18 LAB — CBC
HCT: 50.5 % — ABNORMAL HIGH (ref 36.0–46.0)
Hemoglobin: 16.8 g/dL — ABNORMAL HIGH (ref 12.0–15.0)
MCH: 28.8 pg (ref 26.0–34.0)
MCHC: 33.3 g/dL (ref 30.0–36.0)
MCV: 86.5 fL (ref 80.0–100.0)
Platelets: 410 10*3/uL — ABNORMAL HIGH (ref 150–400)
RBC: 5.84 MIL/uL — ABNORMAL HIGH (ref 3.87–5.11)
RDW: 13.1 % (ref 11.5–15.5)
WBC: 14.8 10*3/uL — ABNORMAL HIGH (ref 4.0–10.5)
nRBC: 0 % (ref 0.0–0.2)

## 2021-08-18 LAB — BASIC METABOLIC PANEL
Anion gap: 9 (ref 5–15)
BUN: 13 mg/dL (ref 6–20)
CO2: 26 mmol/L (ref 22–32)
Calcium: 9.3 mg/dL (ref 8.9–10.3)
Chloride: 101 mmol/L (ref 98–111)
Creatinine, Ser: 0.93 mg/dL (ref 0.44–1.00)
GFR, Estimated: >60 mL/min (ref >60)
Glucose, Bld: 198 mg/dL — ABNORMAL HIGH (ref 70–99)
Potassium: 3.2 mmol/L — ABNORMAL LOW (ref 3.5–5.1)
Sodium: 136 mmol/L (ref 135–145)

## 2021-08-18 LAB — BRAIN NATRIURETIC PEPTIDE: B Natriuretic Peptide: 128.1 pg/mL — ABNORMAL HIGH (ref 0.0–100.0)

## 2021-08-18 LAB — TROPONIN I (HIGH SENSITIVITY)
Troponin I (High Sensitivity): 7 ng/L (ref <18)
Troponin I (High Sensitivity): 10 ng/L (ref <18)

## 2021-08-18 MED ORDER — ALPRAZOLAM 0.25 MG PO TABS
1.0000 mg | ORAL_TABLET | Freq: Once | ORAL | Status: AC
  Administered 2021-08-18: 1 mg via ORAL
  Filled 2021-08-18: qty 4

## 2021-08-18 NOTE — ED Notes (Signed)
Pt remained above 98% during ambulation  ?

## 2021-08-18 NOTE — ED Notes (Signed)
All discharge instructions reviewed with patient including follow up care and worsening symptoms. Patient verbalized understanding and had no other questions. Patient stable and ambulatory at time of discharge.  ?

## 2021-08-18 NOTE — ED Provider Triage Note (Addendum)
Emergency Medicine Provider Triage Evaluation Note ? ?Christine [name] , a 56 y.o. female  was evaluated in triage.  Pt complains of shortness of breath since 1:45 PM.  Patient reports that back in October she had 2 heart attacks and had AICD implanted after this.  The patient reports that she has felt like "a bear is hugging me", associated with lightheadedness and dizziness.  Patient reports that this is similar to how her last heart attack came on.  Patient denies any feeling of her defibrillator kicking in.  Patient also endorsing dry mouth.  Patient denies any chest pain, nausea, vomiting, abdominal pain, lower extremity swelling. ? ?Review of Systems  ?Positive:  ?Negative:  ? ?Physical Exam  ?BP 113/76 (BP Location: Right Arm)   Pulse 99   Temp 99.6 ?F (37.6 ?C) (Oral)   Resp 14   Ht 5' 10"  (1.778 m)   Wt 88 kg   SpO2 96%   BMI 27.84 kg/m?  ?Gen:   Awake, no distress   ?Resp:  Normal effort  ?MSK:   Moves extremities without difficulty  ?Other:  Lung sounds clear ? ?Medical Decision Making  ?Medically screening exam initiated at 3:20 PM.  Appropriate orders placed.  Wilhelmina Hark was informed that the remainder of the evaluation will be completed by another provider, this initial triage assessment does not replace that evaluation, and the importance of remaining in the ED until their evaluation is complete. ? ? ? ? ?  ?Azucena Cecil, PA-C ?08/18/21 1521 ? ?

## 2021-08-18 NOTE — ED Provider Notes (Signed)
?[facility] ?Provider Note ? ? ?CSN: [id number] ?Arrival date & time: 08/18/21  1442 ? ?  ? ?History ? ?Chief Complaint  ?Patient presents with  ? Shortness of Breath  ? ? ?Christine [name] is a 56 y.o. female.  Presents for shortness of breath.  Patient states that she had increased shortness of breath this afternoon, denied any associated chest pain, states that her breathing seems to be improving.  Patient does report that she has had some chills lately but she denies any fevers.  She denies any recent weight gain. ? ?HPI ? ?  ? ?Home Medications ?Prior to Admission medications   ?Medication Sig Start Date End Date Taking? Authorizing Provider  ?acetaminophen (TYLENOL) 500 MG tablet Take 1,000 mg by mouth every 6 (six) hours as needed for moderate pain.    [provider]  ?ALPRAZolam Duanne Moron) 1 MG tablet Take 2 mg by mouth 3 (three) times daily.    [provider]  ?apixaban (ELIQUIS) 5 MG TABS tablet Take 1 tablet (5 mg total) by mouth 2 (two) times daily. 01/25/21   Little Ishikawa, MD  ?atorvastatin (LIPITOR) 80 MG tablet Take 1 tablet (80 mg total) by mouth daily. 06/21/21   Larey Dresser, MD  ?cyclobenzaprine (FLEXERIL) 10 MG tablet Take 20 mg by mouth at bedtime. 08/05/20   [provider]  ?dapagliflozin propanediol (FARXIGA) 10 MG TABS tablet Take 1 tablet (10 mg total) by mouth daily. 06/21/21   Bensimhon, Shaune Pascal, MD  ?digoxin (LANOXIN) 0.125 MG tablet Take 1 tablet (0.125 mg total) by mouth daily. 06/21/21   Larey Dresser, MD  ?diphenhydrAMINE (BENADRYL) 25 MG tablet Take 25 mg by mouth every 6 (six) hours as needed for allergies.    [provider]  ?furosemide (LASIX) 20 MG tablet TAKE 3 TABLETS (60 MG TOTAL) BY MOUTH 2 (TWO) TIMES DAILY. 07/11/21   Bensimhon, Shaune Pascal, MD  ?ivabradine (CORLANOR) 5 MG TABS tablet Take 1 tablet (5 mg total) by mouth 2 (two) times daily with a meal. 05/11/21   Bensimhon, Shaune Pascal, MD   ?loperamide (IMODIUM A-D) 2 MG tablet Take 2-4 mg by mouth 4 (four) times daily as needed for diarrhea or loose stools.    [provider]  ?losartan (COZAAR) 25 MG tablet Take 12.5 mg by mouth daily.    [provider]  ?metoprolol succinate (TOPROL-XL) 25 MG 24 hr tablet Take 1 tablet (25 mg total) by mouth daily. Take with or immediately following a meal. 06/26/21   Bensimhon, Shaune Pascal, MD  ?metroNIDAZOLE (METROGEL) 1 % gel Apply 1 application topically daily. ?Patient taking differently: Apply 1 application. topically daily as needed (rosaecea). 05/09/21   Ladell Pier, MD  ?oxymetazoline (AFRIN) 0.05 % nasal spray Place 2 sprays into both nostrils 2 (two) times daily as needed for congestion.    [provider]  ?potassium chloride SA (KLOR-CON M) 20 MEQ tablet Take 2 tablets (40 mEq total) by mouth 2 (two) times daily. 07/26/21   Rafael Bihari, FNP  ?spironolactone (ALDACTONE) 25 MG tablet Take 1 tablet (25 mg total) by mouth daily. 06/21/21   Larey Dresser, MD  ?venlafaxine XR (EFFEXOR-XR) 150 MG 24 hr capsule Take 150 mg by mouth daily with breakfast.    [provider]  ?zolpidem (AMBIEN) 10 MG tablet Take 5-10 mg by mouth at bedtime.    [provider]  ?   ? ?Allergies    ?Sulfa  antibiotics, Erythromycin, Tramadol, and Tape   ? ?Review of Systems   ?Review of Systems  ?Constitutional:  Negative for chills and fever.  ?HENT:  Negative for ear pain and sore throat.   ?Eyes:  Negative for pain and visual disturbance.  ?Respiratory:  Positive for shortness of breath. Negative for cough.   ?Cardiovascular:  Negative for chest pain and palpitations.  ?Gastrointestinal:  Negative for abdominal pain and vomiting.  ?Genitourinary:  Negative for dysuria and hematuria.  ?Musculoskeletal:  Negative for arthralgias and back pain.  ?Skin:  Negative for color change and rash.  ?Neurological:  Negative for seizures and syncope.  ?All other systems reviewed and are  negative. ? ?Physical Exam ?Updated Vital Signs ?BP 110/68   Pulse 86   Temp 98.6 ?F (37 ?C) (Oral)   Resp 14   Ht 5' 10"  (1.778 m)   Wt 88 kg   SpO2 97%   BMI 27.84 kg/m?  ?Physical Exam ?Vitals and nursing note reviewed.  ?Constitutional:   ?   General: She is not in acute distress. ?   Appearance: She is well-developed.  ?HENT:  ?   Head: Normocephalic and atraumatic.  ?Eyes:  ?   Conjunctiva/sclera: Conjunctivae normal.  ?Cardiovascular:  ?   Rate and Rhythm: Normal rate and regular rhythm.  ?   Heart sounds: No murmur heard. ?Pulmonary:  ?   Effort: Pulmonary effort is normal. No respiratory distress.  ?   Breath sounds: Normal breath sounds.  ?Abdominal:  ?   Palpations: Abdomen is soft.  ?   Tenderness: There is no abdominal tenderness.  ?Musculoskeletal:     ?   General: No swelling.  ?   Cervical back: Neck supple.  ?   Right lower leg: No edema.  ?   Left lower leg: No edema.  ?Skin: ?   General: Skin is warm and dry.  ?   Capillary Refill: Capillary refill takes less than 2 seconds.  ?Neurological:  ?   Mental Status: She is alert.  ?Psychiatric:     ?   Mood and Affect: Mood normal.  ? ? ?ED Results / Procedures / Treatments   ?Labs ?(all labs ordered are listed, but only abnormal results are displayed) ?Labs Reviewed  ?BASIC METABOLIC PANEL - Abnormal; Notable for the following components:  ?    Result Value  ? Potassium 3.2 (*)   ? Glucose, Bld 198 (*)   ? All other components within normal limits  ?CBC - Abnormal; Notable for the following components:  ? WBC 14.8 (*)   ? RBC 5.84 (*)   ? Hemoglobin 16.8 (*)   ? HCT 50.5 (*)   ? Platelets 410 (*)   ? All other components within normal limits  ?BRAIN NATRIURETIC PEPTIDE - Abnormal; Notable for the following components:  ? B Natriuretic Peptide 128.1 (*)   ? All other components within normal limits  ?TROPONIN I (HIGH SENSITIVITY)  ?TROPONIN I (HIGH SENSITIVITY)  ? ? ?EKG ?EKG Interpretation ? ?Date/Time:  Friday Aug 18 2021 15:03:36  EDT ?Ventricular Rate:  109 ?PR Interval:  168 ?QRS Duration: 104 ?QT Interval:  340 ?QTC Calculation: 457 ?R Axis:   28 ?Text Interpretation: Sinus tachycardia Inferior infarct , age undetermined Anterolateral infarct , age undetermined Abnormal ECG When compared with ECG of 26-Jun-2021 11:41, PREVIOUS ECG IS PRESENT Confirmed by Madalyn Rob [phone]) on 08/18/2021 6:55:53 PM ? ?Radiology ?DG Chest 1 View ? ?Result Date: 08/18/2021 ?CLINICAL DATA:  Provided history:  Shortness of breath. Additional history provided: Tachycardia, weakness. EXAM: CHEST  1 VIEW COMPARISON:  Prior chest radiographs 07/21/2021 and earlier. FINDINGS: Redemonstrated left chest single lead AICD. Heart size within normal limits. No appreciable airspace consolidation or pulmonary edema. No evidence of pleural effusion or pneumothorax. No acute bony abnormality identified. Dextrocurvature of the mid-to-lower thoracic spine. Surgical clips within the right upper quadrant of the abdomen. IMPRESSION: No evidence of acute cardiopulmonary abnormality. Electronically Signed   By: Kellie Simmering D.O.   On: 08/18/2021 15:36   ? ?Procedures ?Procedures  ? ? ?Medications Ordered in ED ?Medications  ?ALPRAZolam Duanne Moron) tablet 1 mg (1 mg Oral Given 08/18/21 2117)  ? ? ?ED Course/ Medical Decision Making/ A&P ?  ?                        ?Medical Decision Making ?Amount and/or Complexity of Data Reviewed ?Labs: ordered. ? ?Risk ?Prescription drug management. ? ? ?56 year old lady with history of ischemic cardiomyopathy, heart failure, recent ICD placement presented to ER due to shortness of breath.  On exam she was well-appearing in no distress with grossly normal vital signs.  Her EKG did not demonstrate any new ischemic change and her troponin x2 was within normal limits.  I independently reviewed CXR, no acute findings, no significant edema or effusion noted.  She does not appear to be in frankly fluid overloaded state at this time.  BNP is only mildly  elevated.  I discussed the case with the cardiology fellow on-call this evening.  He advises admitting patient for observation given her past cardiac history, also recommends checking interrogation of her ICD.  I had lengthy discussion wit

## 2021-08-18 NOTE — ED Notes (Signed)
This RN was able to successfully interrogate patient's Chiropractor. Data successfully sent. Patient verbalized that she does not want to stay for results and would like to be discharged home at this time. Provider made aware of same.  ?

## 2021-08-18 NOTE — ED Triage Notes (Signed)
Sob for one hour tachycardia and wealness   she has an aicd ?

## 2021-08-18 NOTE — Discharge Instructions (Addendum)
Both myself and the cardiologist that I discussed the case with tonight recommended that you stay in the hospital tonight and be admitted for further observation patient.  You have chosen to go home.  Please follow-up with your cardiologist regarding the symptoms that you experienced today.  Call their clinic Monday to schedule appointment. ? ?If you change your mind or you have any increased work of breathing, chest discomfort, please come back to ER immediately for reassessment. ?

## 2021-08-21 ENCOUNTER — Encounter: Payer: Self-pay | Admitting: Internal Medicine

## 2021-08-21 ENCOUNTER — Encounter (HOSPITAL_COMMUNITY): Payer: Self-pay

## 2021-08-21 ENCOUNTER — Other Ambulatory Visit (HOSPITAL_COMMUNITY): Payer: Self-pay

## 2021-08-22 NOTE — Progress Notes (Signed)
? ?ADVANCED HEART FAILURE CLINIC NOTE ? ? ?PCP: [facility] ?Cardiology: Dr Oval Linsey ?HF Cardiologist: Dr Haroldine Laws ? ?HPI: ?Christine [name] is a 56 y.o.female with history of chronic tobacco use, ADHD, anxiety, depression, CAD,  systolic HF ?  ?Presented to ED 01/20/21 with increased shortness of breath/tachycardia. Adderrall stopped. Echo with EF 20-25%,  LHC/RHC with single vessel CAD (occluded mLAD) elevated filling pressures and  moderately reduced CO. Digoxin added. Beta blocker stopped.  Discharged to home 01/25/21. Discharge weight 213 pounds.  ? ?Echo 05/11/21 EF < 20% LV severely dilated RV ok Mild MR. Referred for ICD. ? ?CPX with very mild HF limitation with elevated Ve/VCO2 slope ? ?S/p [employer] ICD 4/23. ? ?Saw PCP 08/10/21 for dizziness and anxiety. Felt insomnia related, labs unremarkable. ? ?Seen in ED 08/18/21 with sudden onset SOB. CXR and EKG re-assuring, Trop x 2 normal. BNP mildly up, advised admission to further work up. She declined, as symptoms completely resolved, and was elected to discharge home. ? ?Today she returns for HF follow up. Overall feeling fine. She does not have exertional dyspnea, able to walk up stairs and lift grocers without difficulty. Denies palpitations, CP, dizziness, edema, or PND/Orthopnea. Appetite ok. No fever or chills. Weight at home 198 pounds. Taking all medications. Asking about handicap placard. Worried about recent sudden SOB that required ED visit, says it was not a panic attack. ? ?Cardiac Testing  ?- Echo 05/11/21 EF < 20% LV severely dilated RV ok Mild MR  ? ?- Echo (10/22): EF 20-25% LV severely dilated, LV apical mural thrombus, RV okay, moderate MR. ? ?- R/LHC (10/22): w/ severe 1v CAD with occlusion of mid LAD., PCW 27, CO 4.7, CI 2.2  ?  ?- cMRI (10/22): demonstrated subendocardial LGE consistent with prior infarcts in LV basal inferolateral wall, apical anterior/septal/inferior walls and apex. LVEF 22%. ? ?- CPX 3/23: ?FVC 3.60 (85%)       ?FEV1 2.89 (87%)        ?FEV1/FVC 80 (100%)        ?MVV 171 (157%)  ? ?Resting HR: 113 Standing HR: 113 Peak HR: 149   (89 % age predicted max HR)  ?BP rest: 98/60 Standing BP: 90/62 BP peak: 134/58  ?Peak VO2: 18.8 (90% predicted peak VO2)  ?VE/VCO2 slope:  33 ?Peak RER: 1.14  ?VE/MVV:  38% ?O2pulse:  11   (100 % predicted O2pulse)  ? ?ROS: All systems negative except as listed in HPI, PMH and Problem List. ? ?SH:  ?Social History  ? ?Socioeconomic History  ? Marital status: Single  ?  Spouse name: Not on file  ? Number of children: Not on file  ? Years of education: 65  ? Highest education level: Not on file  ?Occupational History  ? Not on file  ?Tobacco Use  ? Smoking status: Former  ?  Packs/day: 0.50  ?  Types: Cigarettes  ?  Quit date: 01/15/2021  ?  Years since quitting: 0.6  ? Smokeless tobacco: Never  ?Vaping Use  ? Vaping Use: Never used  ?Substance and Sexual Activity  ? Alcohol use: Yes  ?  Comment: rarely  ? Drug use: Never  ? Sexual activity: Not on file  ?Other Topics Concern  ? Not on file  ?Social History Narrative  ? Not on file  ? ?Social Determinants of Health  ? ?Financial Resource Strain: Medium Risk  ? Difficulty of Paying Living Expenses: Somewhat hard  ?Food Insecurity: No Food Insecurity  ?  Worried About Charity fundraiser in the Last Year: Never true  ? Ran Out of Food in the Last Year: Never true  ?Transportation Needs: No Transportation Needs  ? Lack of Transportation (Medical): No  ? Lack of Transportation (Non-Medical): No  ?Physical Activity: Not on file  ?Stress: Not on file  ?Social Connections: Not on file  ?Intimate Partner Violence: Not on file  ? ?FH:  ?Family History  ?Problem Relation Age of Onset  ? Stroke Mother   ? Atrial fibrillation Mother   ? Heart failure Father   ? ?Past Medical History:  ?Diagnosis Date  ? Anxiety 01/20/2021  ? CHF (congestive heart failure) ([place])   ? Shortness of breath 01/20/2021  ? Tobacco abuse 01/20/2021  ? ?Current Outpatient Medications   ?Medication Sig Dispense Refill  ? acetaminophen (TYLENOL) 500 MG tablet Take 1,000 mg by mouth every 6 (six) hours as needed for moderate pain.    ? ALPRAZolam (XANAX) 1 MG tablet Take 2 mg by mouth 3 (three) times daily.    ? apixaban (ELIQUIS) 5 MG TABS tablet Take 1 tablet (5 mg total) by mouth 2 (two) times daily. 60 tablet 0  ? atorvastatin (LIPITOR) 80 MG tablet Take 1 tablet (80 mg total) by mouth daily. 30 tablet 3  ? cyclobenzaprine (FLEXERIL) 10 MG tablet Take 20 mg by mouth at bedtime.    ? dapagliflozin propanediol (FARXIGA) 10 MG TABS tablet Take 1 tablet (10 mg total) by mouth daily. 30 tablet 3  ? digoxin (LANOXIN) 0.125 MG tablet Take 1 tablet (0.125 mg total) by mouth daily. 30 tablet 3  ? diphenhydrAMINE (BENADRYL) 25 MG tablet Take 25 mg by mouth every 6 (six) hours as needed for allergies.    ? furosemide (LASIX) 20 MG tablet TAKE 3 TABLETS (60 MG TOTAL) BY MOUTH 2 (TWO) TIMES DAILY. 180 tablet 3  ? ivabradine (CORLANOR) 5 MG TABS tablet Take 1 tablet (5 mg total) by mouth 2 (two) times daily with a meal. 60 tablet 6  ? loperamide (IMODIUM A-D) 2 MG tablet Take 2-4 mg by mouth 4 (four) times daily as needed for diarrhea or loose stools.    ? losartan (COZAAR) 25 MG tablet Take 12.5 mg by mouth daily.    ? metoprolol succinate (TOPROL-XL) 25 MG 24 hr tablet Take 1 tablet (25 mg total) by mouth daily. Take with or immediately following a meal. 30 tablet 11  ? metroNIDAZOLE (METROGEL) 1 % gel Apply 1 application topically daily. (Patient taking differently: Apply 1 application. topically daily as needed (rosaecea).) 60 g 0  ? oxymetazoline (AFRIN) 0.05 % nasal spray Place 2 sprays into both nostrils 2 (two) times daily as needed for congestion.    ? potassium chloride SA (KLOR-CON M) 20 MEQ tablet Take 2 tablets (40 mEq total) by mouth 2 (two) times daily. 120 tablet 3  ? spironolactone (ALDACTONE) 25 MG tablet Take 1 tablet (25 mg total) by mouth daily. 30 tablet 3  ? venlafaxine XR (EFFEXOR-XR)  75 MG 24 hr capsule Take 75 mg by mouth 3 (three) times daily.    ? zolpidem (AMBIEN) 10 MG tablet Take 5-10 mg by mouth at bedtime.    ? ?No current facility-administered medications for this encounter.  ? ?BP 100/68   Pulse 83   Wt 89.8 kg   SpO2 96%   BMI 28.41 kg/m?  ? ?Wt Readings from Last 3 Encounters:  ?08/23/21 89.8 kg  ?08/18/21 88 kg  ?08/10/21 88  kg  ? ?PHYSICAL EXAM: ?General:  NAD. No resp difficulty ?HEENT: Normal ?Neck: Supple. No JVD. Carotids 2+ bilat; no bruits. No lymphadenopathy or thryomegaly appreciated. ?Cor: PMI nondisplaced. Regular rate & rhythm. No rubs, gallops or murmurs. ?Lungs: Clear ?Abdomen: Soft, nontender, nondistended. No hepatosplenomegaly. No bruits or masses. Good bowel sounds. ?Extremities: No cyanosis, clubbing, rash, edema ?Neuro: Alert & oriented x 3, cranial nerves grossly intact. Moves all 4 extremities w/o difficulty. Affect pleasant. ? ?ICD interrogation (personally reviewed): stable thoracic impedence, 1.1 hrs/day activity, mean HR 92 bpm ? ?ASSESSMENT & PLAN: ?1. 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 Echo EF 20-25%, severely dilated LV, RV okay, moderate MR ?- R/LHC (10/22): 1v CAD occluded mid LAD PCWP 27,  Fick 4.7/2.  ?- cMRI EF 22% subendocardial LGE consistent with prior infarcts in LV basal inferolateral wall, apical anterior/septal/inferior walls and apex. No viability. RV okay. Not sure how to explain inferior defects on cMRI  ?- Echo 05/11/21 EF < 20% RV ok ?- CPX 1/23 with very mild HF limitation:  Peak VO2: 18.8 (90% predicted peak VO2) VE/VCO2 slope:  33 Peak RER: 1.14  ?- s/p [employer] ICD 4/23. ?- Clinically much improved. NYHA I- II. Volume status looks good.  ?- Continue Ivabradine 5 mg bid. HR 83 today. ?- Continue Toprol XL 25 mg daily. ?- Continue losartan 12.5 mg daily (no BP room for Entresto). ?- Continue Lasix 60 mg bid. ?- Continue digoxin 0.125 mg daily. ?- Continue Farxiga 10 mg daily.  ?- Continue spiro 25 mg  daily. ?- Needs to remain off adderall with reduced EF. ?- Recent labs OK. Will get digoxin trough tomorrow (she took her dig today) at appt with Dr. Oval Linsey. ?- Unclear what triggered her SOB that prompted her t

## 2021-08-23 ENCOUNTER — Encounter (HOSPITAL_COMMUNITY): Payer: Self-pay

## 2021-08-23 ENCOUNTER — Other Ambulatory Visit (HOSPITAL_COMMUNITY): Payer: Self-pay

## 2021-08-23 ENCOUNTER — Ambulatory Visit (HOSPITAL_COMMUNITY)
Admission: RE | Admit: 2021-08-23 | Discharge: 2021-08-23 | Disposition: A | Discharge status: Home / Self Care | Payer: 59 | Source: Ambulatory Visit | Attending: Family Medicine | Admitting: Family Medicine

## 2021-08-23 ENCOUNTER — Encounter (HOSPITAL_COMMUNITY): Payer: Medicaid Other

## 2021-08-23 VITALS — BP 100/68 | HR 83 | Wt 198.0 lb

## 2021-08-23 DIAGNOSIS — Z87891 Personal history of nicotine dependence: Secondary | ICD-10-CM | POA: Diagnosis not present

## 2021-08-23 DIAGNOSIS — I2510 Atherosclerotic heart disease of native coronary artery without angina pectoris: Secondary | ICD-10-CM | POA: Diagnosis not present

## 2021-08-23 DIAGNOSIS — F909 Attention-deficit hyperactivity disorder, unspecified type: Secondary | ICD-10-CM | POA: Diagnosis not present

## 2021-08-23 DIAGNOSIS — Z79899 Other long term (current) drug therapy: Secondary | ICD-10-CM | POA: Diagnosis not present

## 2021-08-23 DIAGNOSIS — G4733 Obstructive sleep apnea (adult) (pediatric): Secondary | ICD-10-CM | POA: Diagnosis not present

## 2021-08-23 DIAGNOSIS — F419 Anxiety disorder, unspecified: Secondary | ICD-10-CM | POA: Diagnosis not present

## 2021-08-23 DIAGNOSIS — I513 Intracardiac thrombosis, not elsewhere classified: Secondary | ICD-10-CM | POA: Insufficient documentation

## 2021-08-23 DIAGNOSIS — Z7901 Long term (current) use of anticoagulants: Secondary | ICD-10-CM | POA: Diagnosis not present

## 2021-08-23 DIAGNOSIS — R002 Palpitations: Secondary | ICD-10-CM | POA: Diagnosis not present

## 2021-08-23 DIAGNOSIS — Z7984 Long term (current) use of oral hypoglycemic drugs: Secondary | ICD-10-CM | POA: Diagnosis not present

## 2021-08-23 DIAGNOSIS — F32A Depression, unspecified: Secondary | ICD-10-CM | POA: Diagnosis not present

## 2021-08-23 DIAGNOSIS — I5022 Chronic systolic (congestive) heart failure: Secondary | ICD-10-CM | POA: Insufficient documentation

## 2021-08-23 NOTE — Patient Instructions (Addendum)
It was great to see you today! ?No medication changes are needed at this time. ? ?PLEASE HOLD DIGOXIN on 08/24/2021 until labs are drawn. ? ?Please be sure to follow up with Dr Radford Pax for an oral device ?[phone] ? ?Keep follow up as scheduled with Dr Haroldine Laws ? ? ?Do the following things EVERYDAY: ?Weigh yourself in the morning before breakfast. Write it down and keep it in a log. ?Take your medicines as prescribed ?Eat low salt foods--Limit salt (sodium) to 2000 mg per day.  ?Stay as active as you can everyday ?Limit all fluids for the day to less than 2 liters ? ?At the [facility], you and your health needs are our priority. As part of our continuing mission to provide you with exceptional heart care, we have created designated Provider Care Teams. These Care Teams include your primary Cardiologist (physician) and Advanced Practice Providers (APPs- Physician Assistants and Nurse Practitioners) who all work together to provide you with the care you need, when you need it.  ? ?You may see any of the following providers on your designated Care Team at your next follow up: ?Dr Glori Bickers ?Dr Loralie Champagne ?Darrick Grinder, NP ?Lyda Jester, PA ?Jessica Milford,NP ?Marlyce Huge, PA ?Audry Riles, PharmD ? ? ?Please be sure to bring in all your medications bottles to every appointment.  ? ?If you have any questions or concerns before your next appointment please send [place] a message through [place] or call our office at [phone].   ? ?TO LEAVE A MESSAGE FOR THE NURSE SELECT OPTION 2, PLEASE LEAVE A MESSAGE INCLUDING: ?YOUR NAME ?DATE OF BIRTH ?CALL BACK NUMBER ?REASON FOR CALL**this is important as we prioritize the call backs ? ?YOU WILL RECEIVE A CALL BACK THE SAME DAY AS LONG AS YOU CALL BEFORE 4:00 PM ? ?

## 2021-08-24 ENCOUNTER — Ambulatory Visit (INDEPENDENT_AMBULATORY_CARE_PROVIDER_SITE_OTHER): Payer: Medicaid Other | Admitting: Cardiovascular Disease

## 2021-08-24 ENCOUNTER — Encounter (HOSPITAL_BASED_OUTPATIENT_CLINIC_OR_DEPARTMENT_OTHER): Payer: Self-pay | Admitting: Cardiovascular Disease

## 2021-08-24 DIAGNOSIS — I513 Intracardiac thrombosis, not elsewhere classified: Secondary | ICD-10-CM | POA: Diagnosis not present

## 2021-08-24 DIAGNOSIS — Z720 Tobacco use: Secondary | ICD-10-CM

## 2021-08-24 DIAGNOSIS — Z95810 Presence of automatic (implantable) cardiac defibrillator: Secondary | ICD-10-CM | POA: Diagnosis not present

## 2021-08-24 DIAGNOSIS — I5042 Chronic combined systolic (congestive) and diastolic (congestive) heart failure: Secondary | ICD-10-CM | POA: Diagnosis not present

## 2021-08-24 DIAGNOSIS — I2510 Atherosclerotic heart disease of native coronary artery without angina pectoris: Secondary | ICD-10-CM | POA: Diagnosis not present

## 2021-08-24 DIAGNOSIS — E669 Obesity, unspecified: Secondary | ICD-10-CM

## 2021-08-24 HISTORY — DX: Atherosclerotic heart disease of native coronary artery without angina pectoris: I25.10

## 2021-08-24 HISTORY — DX: Chronic combined systolic (congestive) and diastolic (congestive) heart failure: I50.42

## 2021-08-24 NOTE — Assessment & Plan Note (Signed)
Managed by Dr. Lovena Le.  Device is stable. ?

## 2021-08-24 NOTE — Assessment & Plan Note (Signed)
Ischemic cardiomyopathy.  LVEF 20 to 25% in the setting of missed MI.  She is doing well from a heart failure standpoint.  Blood pressures are low but she is asymptomatic.  She will keep an eye on it, but for now continue digoxin, losartan, metoprolol, Farxiga, and spironolactone.  If her blood pressure remains low or she becomes symptomatic, would do reduce the dose of her spironolactone.  She already has an ICD.  She follows with the heart failure clinic and it is unnecessary for her to see [place] as well. ?

## 2021-08-24 NOTE — Assessment & Plan Note (Signed)
She completed cardiac rehab and was encouraged exercise at least 150 minutes weekly. ?

## 2021-08-24 NOTE — Assessment & Plan Note (Signed)
This was noted on echo and MRI.  Continue Eliquis. ?

## 2021-08-24 NOTE — Progress Notes (Signed)
? ? ?Cardiology Office Note ? ? ?Date:  08/24/2021  ? ?ID:  Christine [name], DOB Aug 21, 1965, MRN [id number] ? ?PCP:  Ladell Pier, MD  ?Cardiologist:   Skeet Latch, MD  ? ?No chief complaint on file. ? ? ?  ?History of Present Illness: ?Christine [name] is a 56 y.o. female with chronic systolic diastolic heart failure, tobacco abuse, ADHD, and anxiety here to establish care.  She was seen in the ED 01/2021 with shortness of breath and tachycardia.  Her Adderall was discontinued.  Echo at that time revealed LVEF 20 to 25%.  She had a left and right heart cath that revealed single-vessel disease with occluded mid LAD.  She started on digoxin and her beta-blocker was discontinued.  Repeat echo 04/2021 had LVEF less than 20% with a severely dilated RV and mild MR.  She was referred for defibrillator and had a Chemical engineer single-chamber ICD implanted 07/2021.  She was seen in the ED 08/18/2021 with sudden onset of shortness of breath.  Cardiac enzymes were negative x2.  BNP was mildly elevated to 128.  They recommended admission but she declined.  She was seen in heart failure clinic yesterday and was feeling well. ? ?Since then she has been feeling well.  She weighs herself and her weight has been stable.  Her blood pressures have been low at home but she denies any lightheadedness or dizziness.  She has no chest pain or shortness of breath.  She has no lower extremity edema, orthopnea, or PND. ? ?Past Medical History:  ?Diagnosis Date  ? Anxiety 01/20/2021  ? CAD in native artery 08/24/2021  ? CHF (congestive heart failure) ([place])   ? Chronic combined systolic and diastolic heart failure ([place]) 08/24/2021  ? Shortness of breath 01/20/2021  ? Tobacco abuse 01/20/2021  ? ? ?Past Surgical History:  ?Procedure Laterality Date  ? ABDOMINAL HYSTERECTOMY    ? CARDIAC CATHETERIZATION    ? CHOLECYSTECTOMY    ? ICD IMPLANT N/A 07/21/2021  ? Procedure: ICD IMPLANT;  Surgeon: Deboraha Sprang, MD;  Location: [facility];   Service: Cardiovascular;  Laterality: N/A;  ? NECK SURGERY    ? OVARIAN CYST SURGERY    ? RIGHT/LEFT HEART CATH AND CORONARY ANGIOGRAPHY N/A 01/23/2021  ? Procedure: RIGHT/LEFT HEART CATH AND CORONARY ANGIOGRAPHY;  Surgeon: Jolaine Artist, MD;  Location: [facility];  Service: Cardiovascular;  Laterality: N/A;  ? ? ? ?Current Outpatient Medications  ?Medication Sig Dispense Refill  ? acetaminophen (TYLENOL) 500 MG tablet Take 1,000 mg by mouth every 6 (six) hours as needed for moderate pain.    ? ALPRAZolam (XANAX) 1 MG tablet Take 2 mg by mouth 3 (three) times daily.    ? apixaban (ELIQUIS) 5 MG TABS tablet Take 1 tablet (5 mg total) by mouth 2 (two) times daily. 60 tablet 0  ? atorvastatin (LIPITOR) 80 MG tablet Take 1 tablet (80 mg total) by mouth daily. 30 tablet 3  ? cyclobenzaprine (FLEXERIL) 10 MG tablet Take 20 mg by mouth at bedtime.    ? dapagliflozin propanediol (FARXIGA) 10 MG TABS tablet Take 1 tablet (10 mg total) by mouth daily. 30 tablet 3  ? digoxin (LANOXIN) 0.125 MG tablet Take 1 tablet (0.125 mg total) by mouth daily. 30 tablet 3  ? diphenhydrAMINE (BENADRYL) 25 MG tablet Take 25 mg by mouth every 6 (six) hours as needed for allergies.    ? furosemide (LASIX) 20 MG tablet TAKE 3 TABLETS (60  MG TOTAL) BY MOUTH 2 (TWO) TIMES DAILY. 180 tablet 3  ? ivabradine (CORLANOR) 5 MG TABS tablet Take 1 tablet (5 mg total) by mouth 2 (two) times daily with a meal. 60 tablet 6  ? loperamide (IMODIUM A-D) 2 MG tablet Take 2-4 mg by mouth 4 (four) times daily as needed for diarrhea or loose stools.    ? losartan (COZAAR) 25 MG tablet Take 12.5 mg by mouth daily.    ? metoprolol succinate (TOPROL-XL) 25 MG 24 hr tablet Take 1 tablet (25 mg total) by mouth daily. Take with or immediately following a meal. 30 tablet 11  ? metroNIDAZOLE (METROGEL) 1 % gel Apply 1 application topically daily. (Patient taking differently: Apply 1 application. topically daily as needed (rosaecea).) 60 g 0  ? oxymetazoline  (AFRIN) 0.05 % nasal spray Place 2 sprays into both nostrils 2 (two) times daily as needed for congestion.    ? potassium chloride SA (KLOR-CON M) 20 MEQ tablet Take 2 tablets (40 mEq total) by mouth 2 (two) times daily. 120 tablet 3  ? spironolactone (ALDACTONE) 25 MG tablet Take 1 tablet (25 mg total) by mouth daily. 30 tablet 3  ? venlafaxine XR (EFFEXOR-XR) 75 MG 24 hr capsule Take 75 mg by mouth 3 (three) times daily.    ? zolpidem (AMBIEN) 10 MG tablet Take 5-10 mg by mouth at bedtime.    ? ?No current facility-administered medications for this visit.  ? ? ?Allergies:   Sulfa antibiotics, Erythromycin, Tramadol, and Tape  ? ? ?Social History:  The patient  reports that she quit smoking about 7 months ago. Her smoking use included cigarettes. She smoked an average of .5 packs per day. She has never used smokeless tobacco. She reports current alcohol use. She reports that she does not use drugs.  ? ?Family History:  The patient's family history includes Atrial fibrillation in her mother; Heart failure in her father; Stroke in her mother.  ? ? ?ROS:  Please see the history of present illness.   Otherwise, review of systems are positive for none.   All other systems are reviewed and negative.  ? ? ?PHYSICAL EXAM: ?VS:  BP (!) 80/60 (BP Location: Right Arm, Patient Position: Sitting, Cuff Size: Large)   Pulse 78   Ht 5' 10"  (1.778 m)   Wt 199 lb 9.6 oz (90.5 kg)   BMI 28.64 kg/m?  , BMI Body mass index is 28.64 kg/m?. ?GENERAL:  Well appearing ?HEENT:  Pupils equal round and reactive, fundi not visualized, oral mucosa unremarkable ?NECK:  No jugular venous distention, waveform within normal limits, carotid upstroke brisk and symmetric, no bruits, no thyromegaly ?LUNGS:  Clear to auscultation bilaterally ?HEART:  RRR.  PMI not displaced or sustained,S1 and S2 within normal limits, no S3, no S4, no clicks, no rubs, no murmurs ?ABD:  Flat, positive bowel sounds normal in frequency in pitch, no bruits, no rebound,  no guarding, no midline pulsatile mass, no hepatomegaly, no splenomegaly ?EXT:  2 plus pulses throughout, no edema, no cyanosis no clubbing ?SKIN:  No rashes no nodules ?NEURO:  Cranial nerves II through XII grossly intact, motor grossly intact throughout ?PSYCH:  Cognitively intact, oriented to person place and time ? ? ?EKG:  EKG is ordered today. ?The ekg ordered today demonstrates sinus rhythm.  Rate 79 bpm.  Prior anterior infarct.  Prior inferior infarct ? ?Echo 01/2021: ?1. Left ventricular ejection fraction, by estimation, is 20 to 25%. The  ?left ventricle has severely  decreased function. The left ventricle  ?demonstrates global hypokinesis. The left ventricular internal cavity size  ?was severely dilated. Left ventricular  ?diastolic parameters are indeterminate.  ? 2. LV apical mural thrombus (best seen images 109-110)  ? 3. Right ventricular systolic function is normal. The right ventricular  ?size is normal. There is normal pulmonary artery systolic pressure.  ? 4. The mitral valve is abnormal. Moderate mitral valve regurgitation.  ?Appears functional.  ? 5. The aortic valve was not well visualized. Aortic valve regurgitation  ?is trivial. No aortic stenosis is present.  ? 6. Aortic dilatation noted. There is mild dilatation of the ascending  ?aorta, measuring 37 mm.  ? 7. The inferior vena cava is normal in size with <50% respiratory  ?variability, suggesting right atrial pressure of 8 mmHg.  ? ?[place] 01/2021: ?  Mid LAD to Dist LAD lesion is 99% stenosed. ?  2nd Sept lesion is 99% stenosed. ?  Prox Cx to Mid Cx lesion is 20% stenosed. ?  Ost LM lesion is 20% stenosed. ?  The left ventricular ejection fraction is less than 25% by visual estimate. ?  ?Findings: ?  ?RA = not sampled ?RV = not sampled ?PA = 36/16 (29) ?PCW = 27 (v = 35) ?Fick cardiac output/index = 4.7/2.2 ?PVR = 0.8 WU ?Ao at = 99% ?PA sat = 68%, 65% ?High SVC = 75% ?  ?Assessment: ?Severe 1v CAD with occlusion of mid LAD ?Ischemic CM EF  20-25% ?Elevated filling pressures with moderately reduced cardiac output ?Significant v waves in PCWP tracing suggestive of significant MR vs diastolic dysfunction ? ? Cardiac MRI 01/2021: ?IMPRESSION: ?

## 2021-08-24 NOTE — Patient Instructions (Signed)
Medication Instructions:  ?Your physician recommends that you continue on your current medications as directed. Please refer to the Current Medication list given to you today.  ? ?Labwork: ?NONE ? ?Testing/Procedures: ?NONE ? ?Follow-Up: ?AS NEEDED  ? ?  ?

## 2021-08-24 NOTE — Assessment & Plan Note (Signed)
Continue to abstain from tobacco products. ?

## 2021-08-24 NOTE — Assessment & Plan Note (Signed)
She had a missed anterior MI.  She currently has no angina.  She had no other significant obstructive coronary disease.  Continue metoprolol, atorvastatin, and she is not on aspirin because she is on Eliquis. ?

## 2021-08-31 ENCOUNTER — Other Ambulatory Visit (HOSPITAL_COMMUNITY): Payer: Self-pay

## 2021-08-31 ENCOUNTER — Encounter (HOSPITAL_COMMUNITY): Payer: Self-pay

## 2021-09-01 ENCOUNTER — Other Ambulatory Visit (HOSPITAL_COMMUNITY): Payer: Self-pay

## 2021-09-01 MED ORDER — FUROSEMIDE 20 MG PO TABS
60.0000 mg | ORAL_TABLET | Freq: Two times a day (BID) | ORAL | 3 refills | Status: DC
Start: 2021-09-01 — End: 2021-10-24
  Filled 2021-09-01: qty 180, 30d supply, fill #0
  Filled 2021-09-26: qty 180, 30d supply, fill #1
  Filled 2021-10-16: qty 180, 30d supply, fill #2

## 2021-09-01 MED FILL — Furosemide Tab 20 MG: ORAL | 30 days supply | Qty: 180 | Fill #0 | Status: CN

## 2021-09-04 ENCOUNTER — Telehealth: Payer: Self-pay | Admitting: Internal Medicine

## 2021-09-04 ENCOUNTER — Other Ambulatory Visit (HOSPITAL_COMMUNITY): Payer: Self-pay

## 2021-09-04 NOTE — Telephone Encounter (Signed)
Patient states she is scheduled to have a mammogram on 6/15 and she would like to know if her defibrillator will interfere with this. Unable to contact device clinic directly for advisement. Please advise as able.

## 2021-09-04 NOTE — Telephone Encounter (Signed)
Spoke with patient informed her that it was ok to have a mammogram with her ICD patient voiced understanding

## 2021-09-05 ENCOUNTER — Ambulatory Visit: Payer: Medicaid Other | Attending: Internal Medicine | Admitting: Internal Medicine

## 2021-09-05 ENCOUNTER — Encounter: Payer: Self-pay | Admitting: Internal Medicine

## 2021-09-05 VITALS — BP 109/72 | HR 83 | Ht 70.0 in | Wt 196.6 lb

## 2021-09-05 DIAGNOSIS — Z95810 Presence of automatic (implantable) cardiac defibrillator: Secondary | ICD-10-CM

## 2021-09-05 DIAGNOSIS — E876 Hypokalemia: Secondary | ICD-10-CM

## 2021-09-05 DIAGNOSIS — Z1211 Encounter for screening for malignant neoplasm of colon: Secondary | ICD-10-CM

## 2021-09-05 DIAGNOSIS — G4733 Obstructive sleep apnea (adult) (pediatric): Secondary | ICD-10-CM | POA: Diagnosis not present

## 2021-09-05 DIAGNOSIS — D72829 Elevated white blood cell count, unspecified: Secondary | ICD-10-CM | POA: Diagnosis not present

## 2021-09-05 DIAGNOSIS — G4709 Other insomnia: Secondary | ICD-10-CM | POA: Diagnosis not present

## 2021-09-05 DIAGNOSIS — Z23 Encounter for immunization: Secondary | ICD-10-CM | POA: Diagnosis not present

## 2021-09-05 DIAGNOSIS — I5020 Unspecified systolic (congestive) heart failure: Secondary | ICD-10-CM

## 2021-09-05 DIAGNOSIS — D751 Secondary polycythemia: Secondary | ICD-10-CM | POA: Diagnosis not present

## 2021-09-05 DIAGNOSIS — I2510 Atherosclerotic heart disease of native coronary artery without angina pectoris: Secondary | ICD-10-CM

## 2021-09-05 MED ORDER — ZOSTER VAC RECOMB ADJUVANTED 50 MCG/0.5ML IM SUSR: 0.5000 mL | Freq: Once | INTRAMUSCULAR | 0 refills | Status: AC

## 2021-09-05 NOTE — Patient Instructions (Signed)
Insomnia Insomnia is a sleep disorder that makes it difficult to fall asleep or stay asleep. Insomnia can cause fatigue, low energy, difficulty concentrating, mood swings, and poor performance at work or school. There are three different ways to classify insomnia: Difficulty falling asleep. Difficulty staying asleep. Waking up too early in the morning. Any type of insomnia can be long-term (chronic) or short-term (acute). Both are common. Short-term insomnia usually lasts for 3 months or less. Chronic insomnia occurs at least three times a week for longer than 3 months. What are the causes? Insomnia may be caused by another condition, situation, or substance, such as: Having certain mental health conditions, such as anxiety and depression. Using caffeine, alcohol, tobacco, or drugs. Having gastrointestinal conditions, such as gastroesophageal reflux disease (GERD). Having certain medical conditions. These include: Asthma. Alzheimer's disease. Stroke. Chronic pain. An overactive thyroid gland (hyperthyroidism). Other sleep disorders, such as restless legs syndrome and sleep apnea. Menopause. Sometimes, the cause of insomnia may not be known. What increases the risk? Risk factors for insomnia include: Gender. Females are affected more often than males. Age. Insomnia is more common as people get older. Stress and certain medical and mental health conditions. Lack of exercise. Having an irregular work schedule. This may include working night shifts and traveling between different time zones. What are the signs or symptoms? If you have insomnia, the main symptom is having trouble falling asleep or having trouble staying asleep. This may lead to other symptoms, such as: Feeling tired or having low energy. Feeling nervous about going to sleep. Not feeling rested in the morning. Having trouble concentrating. Feeling irritable, anxious, or depressed. How is this diagnosed? This condition  may be diagnosed based on: Your symptoms and medical history. Your health care provider may ask about: Your sleep habits. Any medical conditions you have. Your mental health. A physical exam. How is this treated? Treatment for insomnia depends on the cause. Treatment may focus on treating an underlying condition that is causing the insomnia. Treatment may also include: Medicines to help you sleep. Counseling or therapy. Lifestyle adjustments to help you sleep better. Follow these instructions at home: Eating and drinking  Limit or avoid alcohol, caffeinated beverages, and products that contain nicotine and tobacco, especially close to bedtime. These can disrupt your sleep. Do not eat a large meal or eat spicy foods right before bedtime. This can lead to digestive discomfort that can make it hard for you to sleep. Sleep habits  Keep a sleep diary to help you and your health care provider figure out what could be causing your insomnia. Write down: When you sleep. When you wake up during the night. How well you sleep and how rested you feel the next day. Any side effects of medicines you are taking. What you eat and drink. Make your bedroom a dark, comfortable place where it is easy to fall asleep. Put up shades or blackout curtains to block light from outside. Use a white noise machine to block noise. Keep the temperature cool. Limit screen use before bedtime. This includes: Not watching TV. Not using your smartphone, tablet, or computer. Stick to a routine that includes going to bed and waking up at the same times every day and night. This can help you fall asleep faster. Consider making a quiet activity, such as reading, part of your nighttime routine. Try to avoid taking naps during the day so that you sleep better at night. Get out of bed if you are still awake after   15 minutes of trying to sleep. Keep the lights down, but try reading or doing a quiet activity. When you feel  sleepy, go back to bed. General instructions Take over-the-counter and prescription medicines only as told by your health care provider. Exercise regularly as told by your health care provider. However, avoid exercising in the hours right before bedtime. Use relaxation techniques to manage stress. Ask your health care provider to suggest some techniques that may work well for you. These may include: Breathing exercises. Routines to release muscle tension. Visualizing peaceful scenes. Make sure that you drive carefully. Do not drive if you feel very sleepy. Keep all follow-up visits. This is important. Contact a health care provider if: You are tired throughout the day. You have trouble in your daily routine due to sleepiness. You continue to have sleep problems, or your sleep problems get worse. Get help right away if: You have thoughts about hurting yourself or someone else. Get help right away if you feel like you may hurt yourself or others, or have thoughts about taking your own life. Go to your nearest emergency room or: Call 911. Call the National Suicide Prevention Lifeline at 1-800-273-8255 or 988. This is open 24 hours a day. Text the Crisis Text Line at 741741. Summary Insomnia is a sleep disorder that makes it difficult to fall asleep or stay asleep. Insomnia can be long-term (chronic) or short-term (acute). Treatment for insomnia depends on the cause. Treatment may focus on treating an underlying condition that is causing the insomnia. Keep a sleep diary to help you and your health care provider figure out what could be causing your insomnia. This information is not intended to replace advice given to you by your health care provider. Make sure you discuss any questions you have with your health care provider. Document Revised: 03/13/2021 Document Reviewed: 03/13/2021 Elsevier Patient Education  2023 [employer] Inc[employer]  

## 2021-09-05 NOTE — Progress Notes (Signed)
Patient ID: Christine [name], female    DOB: 01/04/1966  MRN: [id number]  CC: Congestive Heart Failure   Subjective: Christine [name] is a 56 y.o. 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  Combined CHF/CAD/LV thrombus/HL: Since last visit with me, pt had ICD placed 07/21/21.  Seen in ER 08/18/21 for SOB.  Admission advised by cardiology fellow.  Pt declined and was seen Dr. Oval Linsey in f/u as out-pt   -no SOB since ER visit.  No CP, LE edema. Compliant with meds: That include digoxin, Toprol 25 mg, spironolactone 25 mg, Cozaar 25 mg, furosemide 60 mg twice a day, Farxiga 10 mg daily, Lipitor 80 mg daily.  She is also on potassium supplement 40 mEq twice a day. No bruising or bleeding on Eliquis  Had sleep study ordered and read by cardiology Dr. Radford Pax.  Dx with mild OSA She has CPAP and has been using it for past 2 wks. Used every night since receiving it except for 2 nights. She can not tell whether it helps or not.  She reports issues with insomnia.  She is on Ambien through her psychiatrist and also takes Flexeril at bedtime.  Usually gets in bed around 11 PM.  She sleeps in a recliner chair because she finds it to be more comfortable for her breathing.  She reads until she falls asleep which usually does not occur until around 2 to 3 AM in the morning.  She wonders whether her heart issue has anything to do with the insomnia.  Pt with persistent polycythemia and elevated WBC.  Mother had ITP and maternal aunt had leukemia.  HM:  Due for Shingrix and Tdapt.  Also due for colon CA screening. Never had c-scope. No fhx of colon CA.   MMG scheduled for next mth Patient Active Problem List   Diagnosis Date Noted   Chronic combined systolic and diastolic heart failure ([place]) 08/24/2021    CAD in native artery 08/24/2021   S/P ICD (internal cardiac defibrillator) procedure 08/10/2021   Former smoker 02/06/2021   Left ventricular apical thrombus 02/06/2021   Obesity (BMI 30.0-34.9) 02/06/2021   Anxiety 01/20/2021   Tobacco abuse 01/20/2021   Shortness of breath 01/20/2021   Chest tightness      Current Outpatient Medications on File Prior to Visit  Medication Sig Dispense Refill   acetaminophen (TYLENOL) 500 MG tablet Take 1,000 mg by mouth every 6 (six) hours as needed for moderate pain.     ALPRAZolam (XANAX) 1 MG tablet Take 2 mg by mouth 3 (three) times daily.     apixaban (ELIQUIS) 5 MG TABS tablet Take 1 tablet (5 mg total) by mouth 2 (two) times daily. 60 tablet 0   atorvastatin (LIPITOR) 80 MG tablet Take 1 tablet (80 mg total) by mouth daily. 30 tablet 3   cyclobenzaprine (FLEXERIL) 10 MG tablet Take 20 mg by mouth at bedtime.     dapagliflozin propanediol (FARXIGA) 10 MG TABS tablet Take 1 tablet (10 mg total) by mouth daily. 30 tablet 3   digoxin (LANOXIN) 0.125 MG tablet Take 1 tablet (0.125 mg total) by mouth daily. 30 tablet 3   diphenhydrAMINE (BENADRYL) 25 MG tablet Take 25 mg by mouth every 6 (six) hours as needed for allergies.     furosemide (LASIX) 20  MG tablet Take 3 tablets (60 mg total) by mouth 2 (two) times daily. 180 tablet 3   ivabradine (CORLANOR) 5 MG TABS tablet Take 1 tablet (5 mg total) by mouth 2 (two) times daily with a meal. 60 tablet 6   loperamide (IMODIUM A-D) 2 MG tablet Take 2-4 mg by mouth 4 (four) times daily as needed for diarrhea or loose stools.     losartan (COZAAR) 25 MG tablet Take 12.5 mg by mouth daily.     metoprolol succinate (TOPROL-XL) 25 MG 24 hr tablet Take 1 tablet (25 mg total) by mouth daily. Take with or immediately following a meal. 30 tablet 11   metroNIDAZOLE (METROGEL) 1 % gel Apply 1 application topically daily. (Patient taking differently: Apply 1 application. topically daily as needed (rosaecea).) 60 g 0    oxymetazoline (AFRIN) 0.05 % nasal spray Place 2 sprays into both nostrils 2 (two) times daily as needed for congestion.     potassium chloride SA (KLOR-CON M) 20 MEQ tablet Take 2 tablets (40 mEq total) by mouth 2 (two) times daily. 120 tablet 3   spironolactone (ALDACTONE) 25 MG tablet Take 1 tablet (25 mg total) by mouth daily. 30 tablet 3   venlafaxine XR (EFFEXOR-XR) 75 MG 24 hr capsule Take 75 mg by mouth 3 (three) times daily.     zolpidem (AMBIEN) 10 MG tablet Take 5-10 mg by mouth at bedtime.     No current facility-administered medications on file prior to visit.    Allergies  Allergen Reactions   Sulfa Antibiotics Hives and Itching   Erythromycin Hives   Tramadol Rash and Other (See Comments)    Urinary retention     Tape Rash    Social History   Socioeconomic History   Marital status: Single    Spouse name: Not on file   Number of children: Not on file   Years of education: 16   Highest education level: Not on file  Occupational History   Not on file  Tobacco Use   Smoking status: Former    Packs/day: 0.50    Types: Cigarettes    Quit date: 01/15/2021    Years since quitting: 0.6   Smokeless tobacco: Never  Vaping Use   Vaping Use: Never used  Substance and Sexual Activity   Alcohol use: Yes    Comment: rarely   Drug use: Never   Sexual activity: Not on file  Other Topics Concern   Not on file  Social History Narrative   Not on file   Social Determinants of Health   Financial Resource Strain: Medium Risk   Difficulty of Paying Living Expenses: Somewhat hard  Food Insecurity: No Food Insecurity   Worried About Charity fundraiser in the Last Year: Never true   Ran Out of Food in the Last Year: Never true  Transportation Needs: No Transportation Needs   Lack of Transportation (Medical): No   Lack of Transportation (Non-Medical): No  Physical Activity: Not on file  Stress: Not on file  Social Connections: Not on file  Intimate Partner Violence:  Not on file    Family History  Problem Relation Age of Onset   Stroke Mother    Atrial fibrillation Mother    Heart failure Father     Past Surgical History:  Procedure Laterality Date   ABDOMINAL HYSTERECTOMY     CARDIAC CATHETERIZATION     CHOLECYSTECTOMY     ICD IMPLANT N/A 07/21/2021   Procedure: ICD IMPLANT;  Surgeon: Deboraha Sprang, MD;  Location: [facility];  Service: Cardiovascular;  Laterality: N/A;   NECK SURGERY     OVARIAN CYST SURGERY     RIGHT/LEFT HEART CATH AND CORONARY ANGIOGRAPHY N/A 01/23/2021   Procedure: RIGHT/LEFT HEART CATH AND CORONARY ANGIOGRAPHY;  Surgeon: Jolaine Artist, MD;  Location: [facility];  Service: Cardiovascular;  Laterality: N/A;    ROS: Review of Systems Negative except as stated above  PHYSICAL EXAM: BP 109/72   Pulse 83   Ht 5' 10"  (1.778 m)   Wt 196 lb 9.6 oz (89.2 kg)   SpO2 98%   BMI 28.21 kg/m   Physical Exam  General appearance - alert, well appearing, and in no distress Mental status - normal mood, behavior, speech, dress, motor activity, and thought processes Neck - supple, no significant adenopathy Chest - clear to auscultation, no wheezes, rales or rhonchi, symmetric air entry Heart - normal rate, regular rhythm, normal S1, S2, no murmurs, rubs, clicks or gallops Extremities -no lower extremity edema.      Latest Ref Rng & Units 08/18/2021    3:10 PM 08/10/2021   10:23 AM 07/14/2021   10:04 AM  CMP  Glucose 70 - 99 mg/dL 198   157   105    BUN 6 - 20 mg/dL 13   14   10     Creatinine 0.44 - 1.00 mg/dL 0.93   0.95   0.94    Sodium 135 - 145 mmol/L 136   140   138    Potassium 3.5 - 5.1 mmol/L 3.2   3.5   3.8    Chloride 98 - 111 mmol/L 101   96   98    CO2 22 - 32 mmol/L 26    28    Calcium 8.9 - 10.3 mg/dL 9.3   9.7   9.4    Total Protein 6.0 - 8.5 g/dL  7.8     Total Bilirubin 0.0 - 1.2 mg/dL  0.4     Alkaline Phos 44 - 121 IU/L  168     AST 0 - 40 IU/L  21      Lipid Panel      Component Value Date/Time   CHOL 216 (H) 01/24/2021 0223   TRIG 179 (H) 01/24/2021 0223   HDL 43 01/24/2021 0223   CHOLHDL 5.0 01/24/2021 0223   VLDL 36 01/24/2021 0223   LDLCALC 137 (H) 01/24/2021 0223    CBC    Component Value Date/Time   WBC 14.8 (H) 08/18/2021 1510   RBC 5.84 (H) 08/18/2021 1510   HGB 16.8 (H) 08/18/2021 1510   HGB 17.5 (H) 08/10/2021 1023   HCT 50.5 (H) 08/18/2021 1510   HCT 50.7 (H) 08/10/2021 1023   PLT 410 (H) 08/18/2021 1510   PLT 374 08/10/2021 1023   MCV 86.5 08/18/2021 1510   MCV 86 08/10/2021 1023   MCH 28.8 08/18/2021 1510   MCHC 33.3 08/18/2021 1510   RDW 13.1 08/18/2021 1510   RDW 13.1 08/10/2021 1023   LYMPHSABS 1.8 08/10/2021 1023   MONOABS 0.8 01/27/2021 1249   EOSABS 0.3 08/10/2021 1023   BASOSABS 0.1 08/10/2021 1023    ASSESSMENT AND PLAN: 1. Polycythemia 2. Leukocytosis, unspecified type Patient with persistent leukocytosis and polycythemia.  Polycythemia may be due to OSA but her OSA is mild.  Will refer to hematology. - Ambulatory referral to Hematology / Oncology  3. Other insomnia Good sleep hygiene discussed and  encouraged. Patient advised not to drink any caffeinated beverages or excessive alcohol use within several hours of bedtime.  Advised to get in bed around about the same time every night.  Once in bed, turn off all lights and sounds.  If unable to fall asleep within 30 to 45 minutes of getting in bed, patient should get up and try to do something until she feels sleepy again.  At that time try getting back in bed.   4. OSA (obstructive sleep apnea) Went over pathophysiology of OSA.  Encouraged her to use the CPAP machine consistently.  5. Coronary artery disease involving native coronary artery of native heart without angina pectoris Stable.  Continue Toprol, Lipitor  6. 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, NYHA class 2 (HCC) Stable on guideline directed therapy that include Toprol, Farxiga,  spironolactone, digoxin  7. S/P ICD (internal cardiac defibrillator) procedure Followed by cardiology.  8. Hypokalemia - Potassium  9. Need for Tdap vaccination Given today.  10. Need for shingles vaccine Discussed recommendation for Shingrix.  Patient would like to have the vaccine.  I have printed the prescription and gave to her to take to an outside pharmacy to have filled and to receive the first shot.  She will check with her insurance first to make sure that it is covered. - Zoster Vaccine Adjuvanted [facility]) injection; Inject 0.5 mLs into the muscle once for 1 dose.  Dispense: 0.5 mL; Refill: 0  11. Screening for colon cancer Discussed colon cancer screening methods.  She is at average risk.  Patient prefers to have the Cologuard test. - Cologuard    Patient was given the opportunity to ask questions.  Patient verbalized understanding of the plan and was able to repeat key elements of the plan.   This documentation was completed using Radio producer.  Any transcriptional errors are unintentional.  No orders of the defined types were placed in this encounter.    Requested Prescriptions    No prescriptions requested or ordered in this encounter    No follow-ups on file.  Karle Plumber, MD, FACP

## 2021-09-06 ENCOUNTER — Telehealth: Payer: Self-pay | Admitting: Hematology and Oncology

## 2021-09-06 ENCOUNTER — Other Ambulatory Visit: Payer: Self-pay | Admitting: Internal Medicine

## 2021-09-06 ENCOUNTER — Other Ambulatory Visit (HOSPITAL_COMMUNITY): Payer: Self-pay

## 2021-09-06 ENCOUNTER — Telehealth: Payer: Self-pay | Admitting: Internal Medicine

## 2021-09-06 DIAGNOSIS — E876 Hypokalemia: Secondary | ICD-10-CM

## 2021-09-06 LAB — POTASSIUM: Potassium: 3.3 mmol/L — ABNORMAL LOW (ref 3.5–5.2)

## 2021-09-06 MED ORDER — POTASSIUM CHLORIDE CRYS ER 20 MEQ PO TBCR
EXTENDED_RELEASE_TABLET | ORAL | 3 refills | Status: DC
  Filled 2021-09-06 – 2021-09-21 (×2): qty 150, 30d supply, fill #0
  Filled 2021-10-17: qty 150, 30d supply, fill #1

## 2021-09-06 NOTE — Telephone Encounter (Signed)
Phone call placed to patient this morning.  Patient informed that her potassium level is still low.  She confirms taking potassium supplement 40 mEq twice a day, spironolactone, Cozaar, furosemide 60 mg twice a day.  I recommend increasing the potassium supplement to 60 meq in the morning and continue with 40 meq in the evening.  I will send an updated prescription to her pharmacy.  Patient expressed understanding.

## 2021-09-06 NOTE — Telephone Encounter (Signed)
Scheduled appt per 5/23 referral. Pt is aware of appt date and time. Pt is aware to arrive 15 mins prior to appt time and to bring and updated insurance card. Pt is aware of appt location.

## 2021-09-07 ENCOUNTER — Ambulatory Visit: Payer: 59 | Admitting: Internal Medicine

## 2021-09-14 DIAGNOSIS — Z419 Encounter for procedure for purposes other than remedying health state, unspecified: Secondary | ICD-10-CM | POA: Diagnosis not present

## 2021-09-17 ENCOUNTER — Encounter (HOSPITAL_COMMUNITY): Payer: Self-pay

## 2021-09-18 ENCOUNTER — Other Ambulatory Visit (HOSPITAL_COMMUNITY): Payer: Self-pay | Admitting: *Deleted

## 2021-09-18 DIAGNOSIS — I5042 Chronic combined systolic (congestive) and diastolic (congestive) heart failure: Secondary | ICD-10-CM

## 2021-09-19 ENCOUNTER — Telehealth: Payer: Self-pay

## 2021-09-19 NOTE — Telephone Encounter (Signed)
Patient scheduled for wound check 09/20/21 @ 8:40 am when Dr. Caryl Comes is in office.   Patient gave permission to attach images in medical record.

## 2021-09-19 NOTE — Telephone Encounter (Signed)
Patient called in stating that her wound site still has not healed. Patient will send pictures to email to deter if she needs to come in or not. I have let patient know I will send to nurse once received and she will get a call back

## 2021-09-20 ENCOUNTER — Inpatient Hospital Stay: Payer: 59 | Attending: Hematology and Oncology | Admitting: Hematology and Oncology

## 2021-09-20 ENCOUNTER — Inpatient Hospital Stay: Payer: 59

## 2021-09-20 ENCOUNTER — Ambulatory Visit (INDEPENDENT_AMBULATORY_CARE_PROVIDER_SITE_OTHER): Payer: 59

## 2021-09-20 VITALS — BP 122/66 | HR 82 | Temp 98.1°F | Resp 15 | Wt 194.6 lb

## 2021-09-20 DIAGNOSIS — F17210 Nicotine dependence, cigarettes, uncomplicated: Secondary | ICD-10-CM | POA: Diagnosis not present

## 2021-09-20 DIAGNOSIS — D72829 Elevated white blood cell count, unspecified: Secondary | ICD-10-CM | POA: Insufficient documentation

## 2021-09-20 DIAGNOSIS — Z800 Family history of malignant neoplasm of digestive organs: Secondary | ICD-10-CM | POA: Insufficient documentation

## 2021-09-20 DIAGNOSIS — D75839 Thrombocytosis, unspecified: Secondary | ICD-10-CM | POA: Diagnosis not present

## 2021-09-20 DIAGNOSIS — F419 Anxiety disorder, unspecified: Secondary | ICD-10-CM | POA: Insufficient documentation

## 2021-09-20 DIAGNOSIS — D751 Secondary polycythemia: Secondary | ICD-10-CM | POA: Insufficient documentation

## 2021-09-20 DIAGNOSIS — Z95810 Presence of automatic (implantable) cardiac defibrillator: Secondary | ICD-10-CM

## 2021-09-20 DIAGNOSIS — Z7901 Long term (current) use of anticoagulants: Secondary | ICD-10-CM | POA: Diagnosis not present

## 2021-09-20 DIAGNOSIS — I2510 Atherosclerotic heart disease of native coronary artery without angina pectoris: Secondary | ICD-10-CM | POA: Insufficient documentation

## 2021-09-20 DIAGNOSIS — Z90710 Acquired absence of both cervix and uterus: Secondary | ICD-10-CM | POA: Diagnosis not present

## 2021-09-20 DIAGNOSIS — G4733 Obstructive sleep apnea (adult) (pediatric): Secondary | ICD-10-CM | POA: Insufficient documentation

## 2021-09-20 DIAGNOSIS — I5042 Chronic combined systolic (congestive) and diastolic (congestive) heart failure: Secondary | ICD-10-CM | POA: Diagnosis not present

## 2021-09-20 DIAGNOSIS — Z806 Family history of leukemia: Secondary | ICD-10-CM | POA: Insufficient documentation

## 2021-09-20 LAB — CBC WITH DIFFERENTIAL (CANCER CENTER ONLY)
Abs Immature Granulocytes: 0.02 10*3/uL (ref 0.00–0.07)
Basophils Absolute: 0.1 10*3/uL (ref 0.0–0.1)
Basophils Relative: 1 %
Eosinophils Absolute: 0.2 10*3/uL (ref 0.0–0.5)
Eosinophils Relative: 2 %
HCT: 45.3 % (ref 36.0–46.0)
Hemoglobin: 15.3 g/dL — ABNORMAL HIGH (ref 12.0–15.0)
Immature Granulocytes: 0 %
Lymphocytes Relative: 22 %
Lymphs Abs: 2.1 10*3/uL (ref 0.7–4.0)
MCH: 29.3 pg (ref 26.0–34.0)
MCHC: 33.8 g/dL (ref 30.0–36.0)
MCV: 86.8 fL (ref 80.0–100.0)
Monocytes Absolute: 0.6 10*3/uL (ref 0.1–1.0)
Monocytes Relative: 7 %
Neutro Abs: 6.8 10*3/uL (ref 1.7–7.7)
Neutrophils Relative %: 68 %
Platelet Count: 330 10*3/uL (ref 150–400)
RBC: 5.22 MIL/uL — ABNORMAL HIGH (ref 3.87–5.11)
RDW: 13.4 % (ref 11.5–15.5)
WBC Count: 9.8 10*3/uL (ref 4.0–10.5)
nRBC: 0 % (ref 0.0–0.2)

## 2021-09-20 LAB — CMP (CANCER CENTER ONLY)
ALT: 29 U/L (ref 0–44)
AST: 21 U/L (ref 15–41)
Albumin: 4.4 g/dL (ref 3.5–5.0)
Alkaline Phosphatase: 112 U/L (ref 38–126)
Anion gap: 9 (ref 5–15)
BUN: 16 mg/dL (ref 6–20)
CO2: 30 mmol/L (ref 22–32)
Calcium: 9.7 mg/dL (ref 8.9–10.3)
Chloride: 104 mmol/L (ref 98–111)
Creatinine: 0.87 mg/dL (ref 0.44–1.00)
GFR, Estimated: >60 mL/min (ref >60)
Glucose, Bld: 96 mg/dL (ref 70–99)
Potassium: 3.9 mmol/L (ref 3.5–5.1)
Sodium: 143 mmol/L (ref 135–145)
Total Bilirubin: 0.5 mg/dL (ref 0.3–1.2)
Total Protein: 8.1 g/dL (ref 6.5–8.1)

## 2021-09-20 LAB — IRON AND IRON BINDING CAPACITY (CC-WL,HP ONLY)
Iron: 53 ug/dL (ref 28–170)
Saturation Ratios: 15 % (ref 10.4–31.8)
TIBC: 364 ug/dL (ref 250–450)
UIBC: 311 ug/dL

## 2021-09-20 LAB — DIGOXIN LEVEL: Digoxin Level: 0.5 ng/mL — ABNORMAL LOW (ref 0.8–2.0)

## 2021-09-20 LAB — RETIC PANEL
Immature Retic Fract: 8.2 % (ref 2.3–15.9)
RBC.: 5.31 MIL/uL — ABNORMAL HIGH (ref 3.87–5.11)
Retic Count, Absolute: 75.9 10*3/uL (ref 19.0–186.0)
Retic Ct Pct: 1.4 % (ref 0.4–3.1)
Reticulocyte Hemoglobin: 32.6 pg (ref >27.9)

## 2021-09-20 LAB — C-REACTIVE PROTEIN: CRP: 0.8 mg/dL (ref <1.0)

## 2021-09-20 LAB — SEDIMENTATION RATE: Sed Rate: 20 mm/hr (ref 0–22)

## 2021-09-20 NOTE — Patient Instructions (Signed)
   After Your ICD   Monitor your pacemaker site for redness, swelling, and drainage. Call the device clinic at [phone] if you experience these symptoms or fever/chills.  Shower and wash your incision with soap and water daily utilizing a clean washcloth and towel with each shower. Avoid lotions, ointments, or perfumes over your incision.

## 2021-09-20 NOTE — Progress Notes (Signed)
[place] [phone]   Fax:([phone] [place] NOTE  Patient Care Team: Ladell Pier, MD as PCP - General (Internal Medicine) Skeet Latch, MD as PCP - Cardiology (Cardiology) Sueanne Margarita, MD as PCP - Sleep Medicine (Cardiology)  Hematological/Oncological History # Polycythemia # Leukocytosis 01/27/2021: WBC 13.7, Hgb 17.3, Plt 403 06/26/2021: WBC 11.2, Hgb 16.3, Plt 368 08/18/2021: WBC 14.8, Hgb 16.8, Plt 410 09/20/2021: establish care with Dr. Lorenso Courier   CHIEF COMPLAINTS/PURPOSE OF CONSULTATION:  "Polycythemia/Leukocytosis "  HISTORY OF PRESENTING ILLNESS:  Christine [name] 56 y.o. female with medical history significant for CHF, CAD, tobacco use, and anxiety who presents for evaluation of leukocytosis and polycythemia.   On review of the previous records Mrs. Minium last had normal blood counts on 01/25/2021.  At that time showed white blood cell count 8.5, hemoglobin 14.1, and platelets of 342.  On 07/28/2020 the patient suddenly developed leukocytosis with white blood cell count 13.7, hemoglobin 17.3, and platelets of 403.  The leukocytosis was a neutrophilic predominance.  She has maintained elevated levels of all 3 cell lines in the interim since that time.  Due to concern for this finding she was referred to hematology for further evaluation and management.  On exam today Mrs. Stinson reports that she has been through quite a lot in the last several months.  She reports that in October she began developing shortness of breath and quit smoking.  Shortly thereafter and approximately 4 days she developed to myocardial infarction's and a blood clot in her heart.  She reports that she developed congestive heart failure with ejection fraction of 15 to 20% at that time.  She notes that she was hospitalized during that period of time and that is when the first abnormalities were noted within her blood counts.  She notes that she  underwent a defibrillator placement on 07/21/2021 and overall it went well but there is a "corner that did not heal well" which continues to have difficulty.  If it does not heal she reports that the device will be removed.  She reports that she has otherwise been well with no major changes in her health.  On further discussion she notes that her family history is remarkable for mother who had ITP and a maternal aunt who had leukemia.  She had a maternal grandmother with stomach cancer and a paternal grandfather with myocardial infarction.  She has stopped smoking altogether since October.  She only drinks about 2 drinks per week.  She reports that she is currently on Eliquis therapy and tolerating it well without any bleeding, bruising, or dark stools.  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  She reports that she always feels fatigued and has difficulties with insomnia.  She was diagnosed with obstructive sleep apnea approximately 3 weeks ago and recently started using a CPAP machine.  She otherwise denies any fevers, chills, sweats, nausea, vomiting or diarrhea.  A full 10 point ROS is listed below  MEDICAL HISTORY:  Past Medical History:  Diagnosis Date   Anxiety 01/20/2021   CAD in native artery 08/24/2021   CHF (congestive heart failure) (HCC)    Chronic combined systolic and diastolic heart failure ([place]) 08/24/2021   Shortness of breath 01/20/2021   Tobacco abuse 01/20/2021    SURGICAL HISTORY: Past Surgical History:  Procedure Laterality Date   ABDOMINAL HYSTERECTOMY     CARDIAC CATHETERIZATION     CHOLECYSTECTOMY     ICD IMPLANT  N/A 07/21/2021   Procedure: ICD IMPLANT;  Surgeon: Deboraha Sprang, MD;  Location: [facility];  Service: Cardiovascular;  Laterality: N/A;   NECK SURGERY     OVARIAN CYST SURGERY     RIGHT/LEFT HEART CATH AND CORONARY ANGIOGRAPHY N/A 01/23/2021   Procedure: RIGHT/LEFT HEART CATH AND CORONARY ANGIOGRAPHY;  Surgeon: Jolaine Artist, MD;  Location: [facility];  Service: Cardiovascular;  Laterality: N/A;    SOCIAL HISTORY: Social History   Socioeconomic History   Marital status: Single    Spouse name: Not on file   Number of children: Not on file   Years of education: 16   Highest education level: Not on file  Occupational History   Not on file  Tobacco Use   Smoking status: Former    Packs/day: 0.50    Types: Cigarettes    Quit date: 01/15/2021    Years since quitting: 0.6   Smokeless tobacco: Never  Vaping Use   Vaping Use: Never used  Substance and Sexual Activity   Alcohol use: Yes    Comment: rarely   Drug use: Never   Sexual activity: Not on file  Other Topics Concern   Not on file  Social History Narrative   Not on file   Social Determinants of Health   Financial Resource Strain: Medium Risk   Difficulty of Paying Living Expenses: Somewhat hard  Food Insecurity: No Food Insecurity   Worried About Charity fundraiser in the Last Year: Never true   Ran Out of Food in the Last Year: Never true  Transportation Needs: No Transportation Needs   Lack of Transportation (Medical): No   Lack of Transportation (Non-Medical): No  Physical Activity: Not on file  Stress: Not on file  Social Connections: Not on file  Intimate Partner Violence: Not on file    FAMILY HISTORY: Family History  Problem Relation Age of Onset   Stroke Mother    Atrial fibrillation Mother    Heart failure Father     ALLERGIES:  is allergic to sulfa antibiotics, erythromycin, tramadol, and tape.  MEDICATIONS:  Current Outpatient Medications  Medication Sig Dispense Refill   acetaminophen (TYLENOL) 500 MG tablet Take 1,000 mg by mouth every 6 (six) hours as needed for moderate pain.     ALPRAZolam (XANAX) 1 MG tablet Take 2 mg by mouth 3 (three) times daily.     apixaban (ELIQUIS) 5 MG TABS tablet Take 1 tablet (5 mg total) by mouth 2 (two) times daily. 60 tablet 0   atorvastatin (LIPITOR) 80 MG  tablet Take 1 tablet (80 mg total) by mouth daily. 30 tablet 3   dapagliflozin propanediol (FARXIGA) 10 MG TABS tablet Take 1 tablet (10 mg total) by mouth daily. 30 tablet 3   digoxin (LANOXIN) 0.125 MG tablet Take 1 tablet (0.125 mg total) by mouth daily. 30 tablet 3   diphenhydrAMINE (BENADRYL) 25 MG tablet Take 25 mg by mouth every 6 (six) hours as needed for allergies.     furosemide (LASIX) 20 MG tablet Take 3 tablets (60 mg total) by mouth 2 (two) times daily. 180 tablet 3   ivabradine (CORLANOR) 5 MG TABS tablet Take 1 tablet (5 mg total) by mouth 2 (two) times daily with a meal. 60 tablet 6   loperamide (IMODIUM A-D) 2 MG tablet Take 2-4 mg by mouth 4 (four) times daily as needed for diarrhea or loose stools.     losartan (COZAAR) 25 MG tablet  Take 12.5 mg by mouth daily.     metoprolol succinate (TOPROL-XL) 25 MG 24 hr tablet Take 1 tablet (25 mg total) by mouth daily. Take with or immediately following a meal. 30 tablet 11   metroNIDAZOLE (METROGEL) 1 % gel Apply 1 application topically daily. (Patient taking differently: Apply 1 application. topically daily as needed (rosaecea).) 60 g 0   oxymetazoline (AFRIN) 0.05 % nasal spray Place 2 sprays into both nostrils 2 (two) times daily as needed for congestion.     potassium chloride SA (KLOR-CON M) 20 MEQ tablet Take 3 tablets (60 mEq total) by mouth in the morning AND 2 tablets (40 mEq total) every evening. 150 tablet 3   prazosin (MINIPRESS) 2 MG capsule Take 2 mg by mouth at bedtime. Take 1 to 3 tabs at night as needed for sleep     spironolactone (ALDACTONE) 25 MG tablet Take 1 tablet (25 mg total) by mouth daily. 30 tablet 3   venlafaxine XR (EFFEXOR-XR) 75 MG 24 hr capsule Take 75 mg by mouth 3 (three) times daily.     zolpidem (AMBIEN) 10 MG tablet Take 5-10 mg by mouth at bedtime.     No current facility-administered medications for this visit.    REVIEW OF SYSTEMS:   Constitutional: ( - ) fevers, ( - )  chills , ( - ) night  sweats Eyes: ( - ) blurriness of vision, ( - ) double vision, ( - ) watery eyes Ears, nose, mouth, throat, and face: ( - ) mucositis, ( - ) sore throat Respiratory: ( - ) cough, ( - ) dyspnea, ( - ) wheezes Cardiovascular: ( - ) palpitation, ( - ) chest discomfort, ( - ) lower extremity swelling Gastrointestinal:  ( - ) nausea, ( - ) heartburn, ( - ) change in bowel habits Skin: ( - ) abnormal skin rashes Lymphatics: ( - ) new lymphadenopathy, ( - ) easy bruising Neurological: ( - ) numbness, ( - ) tingling, ( - ) new weaknesses Behavioral/Psych: ( - ) mood change, ( - ) new changes  All other systems were reviewed with the patient and are negative.  PHYSICAL EXAMINATION:  Vitals:   09/20/21 1350  BP: 122/66  Pulse: 82  Resp: 15  Temp: 98.1 F (36.7 C)  SpO2: 97%   Filed Weights   09/20/21 1350  Weight: 194 lb 9.6 oz (88.3 kg)    GENERAL: well appearing middle aged Caucasian female in NAD  SKIN: skin color, texture, turgor are normal, no rashes or significant lesions EYES: conjunctiva are pink and non-injected, sclera clear LUNGS: clear to auscultation and percussion with normal breathing effort HEART: regular rate & rhythm and no murmurs and no lower extremity edema Musculoskeletal: no cyanosis of digits and no clubbing  PSYCH: alert & oriented x 3, fluent speech NEURO: no focal motor/sensory deficits  LABORATORY DATA:  I have reviewed the data as listed    Latest Ref Rng & Units 09/20/2021    3:04 PM 08/18/2021    3:10 PM 08/10/2021   10:23 AM  CBC  WBC 4.0 - 10.5 K/uL 9.8   14.8   12.7    Hemoglobin 12.0 - 15.0 g/dL 15.3   16.8   17.5    Hematocrit 36.0 - 46.0 % 45.3   50.5   50.7    Platelets 150 - 400 K/uL 330   410   374         Latest Ref Rng & Units 09/20/2021  3:04 PM 09/05/2021    2:51 PM 08/18/2021    3:10 PM  CMP  Glucose 70 - 99 mg/dL 96    198    BUN 6 - 20 mg/dL 16    13    Creatinine 0.44 - 1.00 mg/dL 0.87    0.93    Sodium 135 - 145 mmol/L 143     136    Potassium 3.5 - 5.1 mmol/L 3.9   3.3   3.2    Chloride 98 - 111 mmol/L 104    101    CO2 22 - 32 mmol/L 30    26    Calcium 8.9 - 10.3 mg/dL 9.7    9.3    Total Protein 6.5 - 8.1 g/dL 8.1      Total Bilirubin 0.3 - 1.2 mg/dL 0.5      Alkaline Phos 38 - 126 U/L 112      AST 15 - 41 U/L 21      ALT 0 - 44 U/L 29         ASSESSMENT & PLAN Christine [name] 56 y.o. female with medical history significant for CHF, CAD, tobacco use, and anxiety who presents for evaluation of leukocytosis and polycythemia.   After review of the labs, review of the records, and discussion with the patient the patients findings are most consistent with polycythemia, leukocytosis, and thrombocytosis of unclear etiology.  There are two types of polycythemia, Primary polycythemia and secondary polycythemia. Primary polycythemia is overproduction of red blood cells due to a driver mutation. The most common mutation is the JAK2 V617F (95% of cases), but there are other mutations which can cause this disorder. Primary polycythemia is a myeloproliferative neoplasm which may require cytoreductive therapy to decrease risk of thrombosis. This can consist of medications or phlebotomy to drive down the red blood cell counts. Secondary polycythemia is polycythemia driven by low oxygen levels. This represents an appropriate response of the body attempting to increase red cell volume. Causes of secondary polycythemia include smoking (most common), obstructive sleep apnea (OSA), or living at altitude. This can also be caused by testosterone supplementation. Certain thalassemias can present with marked erythrocytosis , but normal hemoglobin. Secondary polycythemia does not have the same level of thrombotic risk and therefore does not require cytoreductive therapy or phlebotomy.    #Polycythemia  #Leukocytosis #Thrombocytosis --workup to include CBC, CMP, reticulocyte count, and erythropoietin level  --patient is an active smoker.  Encourage smoking cessation.     --patient was recently diagnosed with sleep apnea and has started on a CPAP machine.  This is possibly the etiology for her current findings. --will order MPN workup to include JAK2 with reflex and BCR/ABL FISH  --RTC in 3 months or sooner if indicated by the above labs.    Orders Placed This Encounter  Procedures   Digoxin level    Standing Status:   Future    Number of Occurrences:   1    Standing Expiration Date:   09/20/2022   CBC with Differential ([facility] Only)    Standing Status:   Future    Number of Occurrences:   1    Standing Expiration Date:   09/21/2022   CMP ([facility] only)    Standing Status:   Future    Number of Occurrences:   1    Standing Expiration Date:   09/21/2022   Ferritin    Standing Status:   Future    Number of Occurrences:  1    Standing Expiration Date:   09/21/2022   Iron and Iron Binding Capacity ([facility] only)    Standing Status:   Future    Number of Occurrences:   1    Standing Expiration Date:   09/21/2022   Retic Panel    Standing Status:   Future    Number of Occurrences:   1    Standing Expiration Date:   09/21/2022   Erythropoietin    Standing Status:   Future    Number of Occurrences:   1    Standing Expiration Date:   09/20/2022   JAK2 (INCLUDING V617F AND EXON 12), MPL,& CALR W/RFL MPN PANEL ([facility])    Standing Status:   Future    Number of Occurrences:   1    Standing Expiration Date:   09/20/2022   BCR ABL1 FISH ([facility])    Standing Status:   Future    Number of Occurrences:   1    Standing Expiration Date:   09/21/2022   Sedimentation rate    Standing Status:   Future    Number of Occurrences:   1    Standing Expiration Date:   09/20/2022   C-reactive protein    Standing Status:   Future    Number of Occurrences:   1    Standing Expiration Date:   09/20/2022    All questions were answered. The patient knows to call the clinic with any problems, questions or concerns.  A total of more than 60  minutes were spent on this encounter with face-to-face time and non-face-to-face time, including preparing to see the patient, ordering tests and/or medications, counseling the patient and coordination of care as outlined above.   Ledell Peoples, MD Department of Hematology/Oncology [place] [facility] Phone: [phone] Pager: [phone] Email: [email or web address]  09/20/2021 5:23 PM

## 2021-09-20 NOTE — Progress Notes (Signed)
Patient presented to scheduled device clinic appointment for c/o non-healing wound at recent ICD implant site (07/21/21). Wound healing well with approximated edges at wound check appointment 08/02/21. Patient states last week a scabbed area "fell off" which resulted in an open area along the suture line that intermittently drains scant amt of serosanguinous fluid. Patient has been gently cleaning with soap and water daily and leaving open to air at night. No fever or chills. Dr Caryl Comes in to assess. Unable to locate suture. She is instructed to continue to wash with warm soapy water utilizing clean washcloth daily. Notify device with fever, chills, increased drainage, redness, or other s/s of infection. Return appointment 09/26/21 at 1:30 pm, SK to assess.

## 2021-09-21 ENCOUNTER — Telehealth: Payer: Self-pay | Admitting: Hematology and Oncology

## 2021-09-21 ENCOUNTER — Other Ambulatory Visit (HOSPITAL_COMMUNITY): Payer: Self-pay

## 2021-09-21 LAB — FERRITIN: Ferritin: 76 ng/mL (ref 11–307)

## 2021-09-21 NOTE — Telephone Encounter (Signed)
Scheduled per 6/7 los, pt has been called and confirmed

## 2021-09-22 LAB — ERYTHROPOIETIN: Erythropoietin: 5.3 m[IU]/mL (ref 2.6–18.5)

## 2021-09-25 ENCOUNTER — Ambulatory Visit (INDEPENDENT_AMBULATORY_CARE_PROVIDER_SITE_OTHER): Payer: Medicaid Other | Admitting: Cardiology

## 2021-09-25 ENCOUNTER — Telehealth: Payer: Self-pay | Admitting: *Deleted

## 2021-09-25 VITALS — BP 104/60 | HR 110 | Ht 70.0 in | Wt 192.0 lb

## 2021-09-25 DIAGNOSIS — G4733 Obstructive sleep apnea (adult) (pediatric): Secondary | ICD-10-CM

## 2021-09-25 DIAGNOSIS — I5042 Chronic combined systolic (congestive) and diastolic (congestive) heart failure: Secondary | ICD-10-CM | POA: Diagnosis not present

## 2021-09-25 NOTE — Progress Notes (Signed)
Sleep medicine CONSULT Note    Date:  09/25/2021   ID:  Christine [name], DOB June 21, 1965, MRN [id number]  PCP:  Ladell Pier, MD  Cardiologist: Glori Bickers, MD   Chief Complaint  Patient presents with   New Patient (Initial Visit)    Obstructive sleep apnea    History of Present Illness:  Christine [name] is a 56 y.o. female who is being seen today for the evaluation of obstructive sleep apnea at the request of Glori Bickers MD.  This is a 56 year old female with a history of CAD, chronic combined systolic and diastolic CHF who is followed by Dr. Haroldine Laws in advanced heart failure clinic.  She was referred For home sleep study testing due to not sleeping well.  She tells me that she has insomnia bad and takes Ambien and is on Effexor for anxiety and depression treated by Psychiatry.  She was recently placed on Prazosin as well for sleep but has been having some HAs with it.  She has tried multiple medications for insomnia.   Home sleep study showed mild obstructive sleep apnea overall with an AHI of 11.8/h but moderate during REM sleep with an AHI of 24/h with O2 sat as low as 83%.  She was started on auto CPAP from 4 to 6 to 15 cm H2O.  She is now referred for sleep medicine consult for evaluation and treatment of her obstructive sleep apnea.  She is doing well with her CPAP device and thinks that she has gotten used to it.  She tolerates the mask and feels the pressure is adequate.  Since going on CPAP she feels rested in the am and has no significant daytime sleepiness.  She denies any significant mouth or nasal dryness or nasal congestion.  She does not think that he snores.     Past Medical History:  Diagnosis Date   Anxiety 01/20/2021   CAD in native artery 08/24/2021   CHF (congestive heart failure) ([place])    Chronic combined systolic and diastolic heart failure ([place]) 08/24/2021   Shortness of breath 01/20/2021   Tobacco abuse 01/20/2021    Past Surgical History:   Procedure Laterality Date   ABDOMINAL HYSTERECTOMY     CARDIAC CATHETERIZATION     CHOLECYSTECTOMY     ICD IMPLANT N/A 07/21/2021   Procedure: ICD IMPLANT;  Surgeon: Deboraha Sprang, MD;  Location: [facility];  Service: Cardiovascular;  Laterality: N/A;   NECK SURGERY     OVARIAN CYST SURGERY     RIGHT/LEFT HEART CATH AND CORONARY ANGIOGRAPHY N/A 01/23/2021   Procedure: RIGHT/LEFT HEART CATH AND CORONARY ANGIOGRAPHY;  Surgeon: Jolaine Artist, MD;  Location: [facility];  Service: Cardiovascular;  Laterality: N/A;    Current Medications: No outpatient medications have been marked as taking for the 09/25/21 encounter (Office Visit) with Sueanne Margarita, MD.    Allergies:   Sulfa antibiotics, Erythromycin, Tramadol, and Tape   Social History   Socioeconomic History   Marital status: Single    Spouse name: Not on file   Number of children: Not on file   Years of education: 16   Highest education level: Not on file  Occupational History   Not on file  Tobacco Use   Smoking status: Former    Packs/day: 0.50    Types: Cigarettes    Quit date: 01/15/2021    Years since quitting: 0.6   Smokeless tobacco: Never  Vaping Use   Vaping Use: Never  used  Substance and Sexual Activity   Alcohol use: Yes    Comment: rarely   Drug use: Never   Sexual activity: Not on file  Other Topics Concern   Not on file  Social History Narrative   Not on file   Social Determinants of Health   Financial Resource Strain: Medium Risk (01/31/2021)   Overall Financial Resource Strain (CARDIA)    Difficulty of Paying Living Expenses: Somewhat hard  Food Insecurity: No Food Insecurity (01/31/2021)   Hunger Vital Sign    Worried About Running Out of Food in the Last Year: Never true    Ran Out of Food in the Last Year: Never true  Transportation Needs: No Transportation Needs (01/31/2021)   PRAPARE - Hydrologist (Medical): No    Lack of Transportation  (Non-Medical): No  Physical Activity: Not on file  Stress: Not on file  Social Connections: Not on file     Family History:  The patient's family history includes Atrial fibrillation in her mother; Heart failure in her father; Stroke in her mother.   ROS:   Please see the history of present illness.    ROS All other systems reviewed and are negative.      No data to display             PHYSICAL EXAM:   VS:  There were no vitals taken for this visit.   GEN: Well nourished, well developed, in no acute distress  HEENT: normal  Neck: no JVD, carotid bruits, or masses Cardiac: RRR; no murmurs, rubs, or gallops,no edema.  Intact distal pulses bilaterally.  Respiratory:  clear to auscultation bilaterally, normal work of breathing GI: soft, nontender, nondistended, + BS MS: no deformity or atrophy  Skin: warm and dry, no rash Neuro:  Alert and Oriented x 3, Strength and sensation are intact Psych: euthymic mood, full affect  Wt Readings from Last 3 Encounters:  09/20/21 194 lb 9.6 oz (88.3 kg)  09/05/21 196 lb 9.6 oz (89.2 kg)  08/24/21 199 lb 9.6 oz (90.5 kg)      Studies/Labs Reviewed:   EKG:  EKG is not ordered today.    Recent Labs: 04/24/2021: Magnesium 2.1 08/10/2021: TSH 2.290 08/18/2021: B Natriuretic Peptide 128.1 09/20/2021: ALT 29; BUN 16; Creatinine 0.87; Hemoglobin 15.3; Platelet Count 330; Potassium 3.9; Sodium 143   Lipid Panel    Component Value Date/Time   CHOL 216 (H) 01/24/2021 0223   TRIG 179 (H) 01/24/2021 0223   HDL 43 01/24/2021 0223   CHOLHDL 5.0 01/24/2021 0223   VLDL 36 01/24/2021 0223   LDLCALC 137 (H) 01/24/2021 0223    Additional studies/ records that were reviewed today include:  Home sleep study and PAP compliance download    ASSESSMENT:    1. OSA (obstructive sleep apnea)   2. Chronic combined systolic and diastolic heart failure (HCC)      PLAN:  In order of problems listed above:  OSA - The patient is tolerating PAP  therapy well without any problems. The PAP download performed by his DME was personally reviewed and interpreted by me today and showed an AHI of 1.8 /hr on auto CPAP from 4-15 cm H2O with 47% compliance in using more than 4 hours nightly.  The patient has been using and benefiting from PAP use and will continue to benefit from therapy.  -I have encouraged her to be more compliant with her CPAP device -She understands if she does  not use her device at least 4 hours nightly 70% of the time insurance may make her Hand it back in -she says that the modem does not always pick up that she is using it so I told her to bring in her SD card to the office in 4 weeks to repeat download -she says that the CPAP is challenging with her Rosacea and the straps and is interested in considering her oral appliance -I will refer her to sleep dentistry to get an evaluation  Chronic combined systolic/diastolic CHF -She does not appear volume overloaded on exam today -Continue prescription drug management with fark [employer] 10 mg daily, Lasix 60 mg twice daily, ivabradine 5 mg twice daily, losartan 12.5 mg daily, Toprol-XL 25 mg daily and spironolactone 25 mg daily with as needed refills  Time Spent: 20 minutes total time of encounter, including 15 minutes spent in face-to-face patient care on the date of this encounter. This time includes coordination of care and counseling regarding above mentioned problem list. Remainder of non-face-to-face time involved reviewing chart documents/testing relevant to the patient encounter and documentation in the medical record. I have independently reviewed documentation from referring provider  Medication Adjustments/Labs and Tests Ordered: Current medicines are reviewed at length with the patient today.  Concerns regarding medicines are outlined above.  Medication changes, Labs and Tests ordered today are listed in the Patient Instructions below.  There are no Patient Instructions on  file for this visit.   Signed, Fransico Him, MD  09/25/2021 11:50 AM    [address], [place], [place]  [address] Phone: [phone]; Fax: [phone]

## 2021-09-25 NOTE — Telephone Encounter (Signed)
Per Dr Radford Pax, she says that the modem does not always pick up that she is using it so I told her to bring in her SD card to the office in 4 weeks to repeat download.

## 2021-09-25 NOTE — Patient Instructions (Signed)
Medication Instructions:  Your physician recommends that you continue on your current medications as directed. Please refer to the Current Medication list given to you today.  *If you need a refill on your cardiac medications before your next appointment, please call your pharmacy*   Lab Work: None If you have labs (blood work) drawn today and your tests are completely normal, you will receive your results only by: [place] (if you have MyChart) OR A paper copy in the mail If you have any lab test that is abnormal or we need to change your treatment, we will call you to review the results.  Follow-Up: At [facility], you and your health needs are our priority.  As part of our continuing mission to provide you with exceptional heart care, we have created designated Provider Care Teams.  These Care Teams include your primary Cardiologist (physician) and Advanced Practice Providers (APPs -  Physician Assistants and Nurse Practitioners) who all work together to provide you with the care you need, when you need it.  Your next appointment:   3 month(s)  The format for your next appointment:   In Person  Provider:   Fransico Him, MD

## 2021-09-26 ENCOUNTER — Ambulatory Visit: Payer: 59

## 2021-09-26 ENCOUNTER — Encounter (HOSPITAL_COMMUNITY): Payer: Self-pay

## 2021-09-26 ENCOUNTER — Other Ambulatory Visit (HOSPITAL_COMMUNITY): Payer: Self-pay | Admitting: Cardiology

## 2021-09-26 ENCOUNTER — Other Ambulatory Visit (HOSPITAL_COMMUNITY): Payer: Self-pay

## 2021-09-26 LAB — BCR ABL1 FISH (GENPATH)

## 2021-09-26 LAB — JAK2 (INCLUDING V617F AND EXON 12), MPL,& CALR W/RFL MPN PANEL (NGS)

## 2021-09-26 MED ORDER — ATORVASTATIN CALCIUM 80 MG PO TABS
80.0000 mg | ORAL_TABLET | Freq: Every day | ORAL | 3 refills | Status: DC
  Filled 2021-09-26: qty 30, 30d supply, fill #0
  Filled 2021-10-17: qty 30, 30d supply, fill #1
  Filled 2021-11-24: qty 30, 30d supply, fill #2
  Filled 2021-12-20: qty 30, 30d supply, fill #3

## 2021-09-28 ENCOUNTER — Ambulatory Visit (INDEPENDENT_AMBULATORY_CARE_PROVIDER_SITE_OTHER): Payer: 59

## 2021-09-28 DIAGNOSIS — Z1231 Encounter for screening mammogram for malignant neoplasm of breast: Secondary | ICD-10-CM

## 2021-09-29 ENCOUNTER — Ambulatory Visit: Payer: Self-pay

## 2021-09-29 ENCOUNTER — Telehealth: Payer: Medicaid Other | Admitting: Emergency Medicine

## 2021-09-29 ENCOUNTER — Other Ambulatory Visit (HOSPITAL_COMMUNITY): Payer: Self-pay

## 2021-09-29 DIAGNOSIS — R197 Diarrhea, unspecified: Secondary | ICD-10-CM | POA: Diagnosis not present

## 2021-09-29 NOTE — Patient Instructions (Signed)
Christine [name], thank you for joining Carvel Getting, NP for today's virtual visit.  While this provider is not your primary care provider (PCP), if your PCP is located in our provider database this encounter information will be shared with them immediately following your visit.  Consent: (Patient) Christine [name] provided verbal consent for this virtual visit at the beginning of the encounter.  Current Medications:  Current Outpatient Medications:    acetaminophen (TYLENOL) 500 MG tablet, Take 1,000 mg by mouth every 6 (six) hours as needed for moderate pain., Disp: , Rfl:    ALPRAZolam (XANAX) 1 MG tablet, Take 2 mg by mouth 3 (three) times daily., Disp: , Rfl:    apixaban (ELIQUIS) 5 MG TABS tablet, Take 1 tablet (5 mg total) by mouth 2 (two) times daily., Disp: 60 tablet, Rfl: 0   atorvastatin (LIPITOR) 80 MG tablet, Take 1 tablet (80 mg total) by mouth daily., Disp: 30 tablet, Rfl: 3   dapagliflozin propanediol (FARXIGA) 10 MG TABS tablet, Take 1 tablet (10 mg total) by mouth daily., Disp: 30 tablet, Rfl: 3   digoxin (LANOXIN) 0.125 MG tablet, Take 1 tablet (0.125 mg total) by mouth daily., Disp: 30 tablet, Rfl: 3   diphenhydrAMINE (BENADRYL) 25 MG tablet, Take 25 mg by mouth every 6 (six) hours as needed for allergies., Disp: , Rfl:    furosemide (LASIX) 20 MG tablet, Take 3 tablets (60 mg total) by mouth 2 (two) times daily., Disp: 180 tablet, Rfl: 3   ivabradine (CORLANOR) 5 MG TABS tablet, Take 1 tablet (5 mg total) by mouth 2 (two) times daily with a meal., Disp: 60 tablet, Rfl: 6   loperamide (IMODIUM A-D) 2 MG tablet, Take 2-4 mg by mouth 4 (four) times daily as needed for diarrhea or loose stools., Disp: , Rfl:    losartan (COZAAR) 25 MG tablet, Take 12.5 mg by mouth daily., Disp: , Rfl:    metoprolol succinate (TOPROL-XL) 25 MG 24 hr tablet, Take 1 tablet (25 mg total) by mouth daily. Take with or immediately following a meal., Disp: 30 tablet, Rfl: 11   metroNIDAZOLE (METROGEL)  1 % gel, Apply 1 application topically daily. (Patient taking differently: Apply 1 application  topically daily as needed (rosaecea).), Disp: 60 g, Rfl: 0   oxymetazoline (AFRIN) 0.05 % nasal spray, Place 2 sprays into both nostrils 2 (two) times daily as needed for congestion., Disp: , Rfl:    potassium chloride SA (KLOR-CON M) 20 MEQ tablet, Take 3 tablets (60 mEq total) by mouth in the morning AND 2 tablets (40 mEq total) every evening., Disp: 150 tablet, Rfl: 3   prazosin (MINIPRESS) 2 MG capsule, Take 2 mg by mouth at bedtime. Take 1 to 3 tabs at night as needed for sleep, Disp: , Rfl:    spironolactone (ALDACTONE) 25 MG tablet, Take 1 tablet (25 mg total) by mouth daily., Disp: 30 tablet, Rfl: 3   venlafaxine XR (EFFEXOR-XR) 75 MG 24 hr capsule, Take 75 mg by mouth 3 (three) times daily., Disp: , Rfl:    zolpidem (AMBIEN) 10 MG tablet, Take 5-10 mg by mouth at bedtime., Disp: , Rfl:    Medications ordered in this encounter:  No orders of the defined types were placed in this encounter.    *If you need refills on other medications prior to your next appointment, please contact your pharmacy*  Follow-Up: Call back or seek an in-person evaluation if the symptoms worsen or if the condition fails to improve as anticipated.  Other Instructions Start with Metamucil fiber supplement one dose per day for the first week. If needed, you can go up to 2 doses per day in week 2 and up to 3 doses per day in week 3.  Beyond that, if you continue to have diarrhea episodes, talk with Dr. Wynetta Emery about seeing a gastroenterologist.   It's ok to take Imodium on occasion if needed for diarrhea per package instructions. If you having to use it daily, please check with Dr. Wynetta Emery before doing so.    If you have been instructed to have an in-person evaluation today at a local Urgent Care facility, please use the link below. It will take you to a list of all of our available [facility], including  address, phone number and hours of operation. Please do not delay care.  [facility]  If you or a family member do not have a primary care provider, use the link below to schedule a visit and establish care. When you choose a [facility] primary care physician or advanced practice provider, you gain a long-term partner in health. Find a Primary Care Provider  Learn more about [facility] in-office and virtual care options: [facility]

## 2021-09-29 NOTE — Progress Notes (Signed)
Virtual Visit Consent   [name], you are scheduled for a virtual visit with a [name] provider today. Just as with appointments in the office, your consent must be obtained to participate. Your consent will be active for this visit and any virtual visit you may have with one of our providers in the next 365 days. If you have a MyChart account, a copy of this consent can be sent to you electronically.  As this is a virtual visit, video technology does not allow for your provider to perform a traditional examination. This may limit your provider's ability to fully assess your condition. If your provider identifies any concerns that need to be evaluated in person or the need to arrange testing (such as labs, EKG, etc.), we will make arrangements to do so. Although advances in technology are sophisticated, we cannot ensure that it will always work on either your end or our end. If the connection with a video visit is poor, the visit may have to be switched to a telephone visit. With either a video or telephone visit, we are not always able to ensure that we have a secure connection.  By engaging in this virtual visit, you consent to the provision of healthcare and authorize for your insurance to be billed (if applicable) for the services provided during this visit. Depending on your insurance coverage, you may receive a charge related to this service.  I need to obtain your verbal consent now. Are you willing to proceed with your visit today? [name] has provided verbal consent on 09/29/2021 for a virtual visit (video or telephone). Carvel Getting, NP  Date: 09/29/2021 12:48 PM  Virtual Visit via Video Note   I, Carvel Getting, connected with  Christine [name]  ([id number], 1965-04-23) on 09/29/21 at 11:30 AM EDT by a video-enabled telemedicine application and verified that I am speaking with the correct person using two identifiers.  Location: Patient: Virtual Visit Location Patient:  Home Provider: Virtual Visit Location Provider: Home Office   I discussed the limitations of evaluation and management by telemedicine and the availability of in person appointments. The patient expressed understanding and agreed to proceed.    History of Present Illness: Christine [name] is a 56 y.o. who identifies as a female who was assigned female at birth, and is being seen today for episodes of diarrhea. She had diarrhea for 1 days three weeks ago. She had diarrhea for 1 day one week ago. And last night her abdomen felt "rumbly" and she feels like she might start having diarrhea again today. When she has diarrhea, it is watery and runny. She denies having blood or mucus in her stool. When she has diarrhea, she takes imodium which relieves it. In between days with diarrhea, her bowel movements return to normal. Her diet has not changed in last few weeks. She has not traveled out of th country, drank from contaminated water sources, or had food that might be contaminated. No one in household but pt has had diarrhea. Denies fever or feeling ill.   She did cologard but does not have results yet. Has not seen GI  Only change in medication is an increase in her potassium supplement. She wonders if her meds/potassium could be causing diarrhea.   HPI: HPI  Problems:  Patient Active Problem List   Diagnosis Date Noted   Chronic combined systolic and diastolic heart failure ([place]) 08/24/2021   CAD in native artery 08/24/2021   S/P ICD (internal cardiac  defibrillator) procedure 08/10/2021   Former smoker 02/06/2021   Left ventricular apical thrombus 02/06/2021   Obesity (BMI 30.0-34.9) 02/06/2021   Anxiety 01/20/2021   Tobacco abuse 01/20/2021   Shortness of breath 01/20/2021   Chest tightness     Allergies:  Allergies  Allergen Reactions   Sulfa Antibiotics Hives and Itching   Erythromycin Hives   Tramadol Rash and Other (See Comments)    Urinary retention     Tape Rash   Medications:   Current Outpatient Medications:    acetaminophen (TYLENOL) 500 MG tablet, Take 1,000 mg by mouth every 6 (six) hours as needed for moderate pain., Disp: , Rfl:    ALPRAZolam (XANAX) 1 MG tablet, Take 2 mg by mouth 3 (three) times daily., Disp: , Rfl:    apixaban (ELIQUIS) 5 MG TABS tablet, Take 1 tablet (5 mg total) by mouth 2 (two) times daily., Disp: 60 tablet, Rfl: 0   atorvastatin (LIPITOR) 80 MG tablet, Take 1 tablet (80 mg total) by mouth daily., Disp: 30 tablet, Rfl: 3   dapagliflozin propanediol (FARXIGA) 10 MG TABS tablet, Take 1 tablet (10 mg total) by mouth daily., Disp: 30 tablet, Rfl: 3   digoxin (LANOXIN) 0.125 MG tablet, Take 1 tablet (0.125 mg total) by mouth daily., Disp: 30 tablet, Rfl: 3   diphenhydrAMINE (BENADRYL) 25 MG tablet, Take 25 mg by mouth every 6 (six) hours as needed for allergies., Disp: , Rfl:    furosemide (LASIX) 20 MG tablet, Take 3 tablets (60 mg total) by mouth 2 (two) times daily., Disp: 180 tablet, Rfl: 3   ivabradine (CORLANOR) 5 MG TABS tablet, Take 1 tablet (5 mg total) by mouth 2 (two) times daily with a meal., Disp: 60 tablet, Rfl: 6   loperamide (IMODIUM A-D) 2 MG tablet, Take 2-4 mg by mouth 4 (four) times daily as needed for diarrhea or loose stools., Disp: , Rfl:    losartan (COZAAR) 25 MG tablet, Take 12.5 mg by mouth daily., Disp: , Rfl:    metoprolol succinate (TOPROL-XL) 25 MG 24 hr tablet, Take 1 tablet (25 mg total) by mouth daily. Take with or immediately following a meal., Disp: 30 tablet, Rfl: 11   metroNIDAZOLE (METROGEL) 1 % gel, Apply 1 application topically daily. (Patient taking differently: Apply 1 application  topically daily as needed (rosaecea).), Disp: 60 g, Rfl: 0   oxymetazoline (AFRIN) 0.05 % nasal spray, Place 2 sprays into both nostrils 2 (two) times daily as needed for congestion., Disp: , Rfl:    potassium chloride SA (KLOR-CON M) 20 MEQ tablet, Take 3 tablets (60 mEq total) by mouth in the morning AND 2 tablets (40 mEq  total) every evening., Disp: 150 tablet, Rfl: 3   prazosin (MINIPRESS) 2 MG capsule, Take 2 mg by mouth at bedtime. Take 1 to 3 tabs at night as needed for sleep, Disp: , Rfl:    spironolactone (ALDACTONE) 25 MG tablet, Take 1 tablet (25 mg total) by mouth daily., Disp: 30 tablet, Rfl: 3   venlafaxine XR (EFFEXOR-XR) 75 MG 24 hr capsule, Take 75 mg by mouth 3 (three) times daily., Disp: , Rfl:    zolpidem (AMBIEN) 10 MG tablet, Take 5-10 mg by mouth at bedtime., Disp: , Rfl:   Observations/Objective: Patient is well-developed, well-nourished in no acute distress.  Resting comfortably  at home.  Head is normocephalic, atraumatic.  No labored breathing.  Speech is clear and coherent with logical content.  Patient is alert and oriented at baseline.  Assessment and Plan: 1. Diarrhea, unspecified type  Diarrhea does not appear to be infectious based on symptoms and sx pattern. She takes several medications that can cause diarrhea. We discussed adding fiber to her diet via supplement. If diarrhea continues, pt is to f/u with pcp.   Follow Up Instructions: I discussed the assessment and treatment plan with the patient. The patient was provided an opportunity to ask questions and all were answered. The patient agreed with the plan and demonstrated an understanding of the instructions.  A copy of instructions were sent to the patient via MyChart unless otherwise noted below.   The patient was advised to call back or seek an in-person evaluation if the symptoms worsen or if the condition fails to improve as anticipated.  Time:  I spent 17 minutes with the patient via telehealth technology discussing the above problems/concerns.    Carvel Getting, NP

## 2021-09-29 NOTE — Telephone Encounter (Signed)
  Chief Complaint: diarrhea Symptoms: diarrhea 1-2 days a week, abdominal upset Frequency: 3 weeks  Pertinent Negatives: NA Disposition: [] ED /[] Urgent Care (no appt availability in office) / [] Appointment(In office/virtual)/ [x]  [facility]/ [] Home Care/ [] Refused Recommended Disposition /[] [facility]/ []  Follow-up with PCP Additional Notes: pt states she has been keeping a food log and doesn't feel like this is related to food, unsure if medication related but d/t taking so much imodium, she is asking if there is something else stronger she can take to help with. No appts available with office so scheduled for virtual UC visit today at 1130.   Reason for Disposition  [1] MODERATE diarrhea (e.g., 4-6 times / day more than normal) AND [2] present > 48 hours (2 days)  Answer Assessment - Initial Assessment Questions 1. DIARRHEA SEVERITY: "How bad is the diarrhea?" "How many more stools have you had in the past 24 hours than normal?"    - NO DIARRHEA (SCALE 0)   - MILD (SCALE 1-3): Few loose or mushy BMs; increase of 1-3 stools over normal daily number of stools; mild increase in ostomy output.   -  MODERATE (SCALE 4-7): Increase of 4-6 stools daily over normal; moderate increase in ostomy output. * SEVERE (SCALE 8-10; OR 'WORST POSSIBLE'): Increase of 7 or more stools daily over normal; moderate increase in ostomy output; incontinence.     Mild  2. ONSET: "When did the diarrhea begin?"      3 weeks ago  3. BM CONSISTENCY: "How loose or watery is the diarrhea?"      Very loose  4. VOMITING: "Are you also vomiting?" If Yes, ask: "How many times in the past 24 hours?"      no 5. ABDOMINAL PAIN: "Are you having any abdominal pain?" If Yes, ask: "What does it feel like?" (e.g., crampy, dull, intermittent, constant)      Crampy  8. HYDRATION: "Any signs of dehydration?" (e.g., dry mouth [not just dry lips], too weak to stand, dizziness, new weight loss) "When did you last  urinate?"     no 10. ANTIBIOTIC USE: "Are you taking antibiotics now or have you taken antibiotics in the past 2 months?"       no 11. OTHER SYMPTOMS: "Do you have any other symptoms?" (e.g., fever, blood in stool)       Stomach rumbling  Protocols used: Diarrhea-A-AH

## 2021-10-02 ENCOUNTER — Telehealth: Payer: Self-pay | Admitting: *Deleted

## 2021-10-02 NOTE — Telephone Encounter (Signed)
TCT patient regarding recent lab results. Spoke with patient and advised that her WBCs and platelets have normalized and that her HGB is trending down towards a normal value. Dr. Lorenso Courier believes this normalization is due to her recent starting of her CPAP machine. Advised that we will see her back in September to re-check her labs and see how she is doing overall. Christine [name] voiced understanding.

## 2021-10-02 NOTE — Telephone Encounter (Signed)
-----   Message from Orson Slick, MD sent at 10/02/2021  8:44 AM EDT ----- Please let Christine [name] know that her WBC and Plt have normalized and her Hgb is downtrending to normal. I think this may be due to the recent starting of the CPAP machine. We will see her back in Sept to reassess.   ----- Message ----- From: Buel Ream, Lab In [place] Sent: 09/20/2021   3:33 PM EDT To: Orson Slick, MD

## 2021-10-05 ENCOUNTER — Encounter (HOSPITAL_COMMUNITY): Payer: Self-pay

## 2021-10-06 ENCOUNTER — Ambulatory Visit: Payer: Self-pay

## 2021-10-06 ENCOUNTER — Other Ambulatory Visit (HOSPITAL_COMMUNITY): Payer: Self-pay

## 2021-10-06 ENCOUNTER — Emergency Department (HOSPITAL_COMMUNITY)
Admission: EM | Admit: 2021-10-06 | Discharge: 2021-10-06 | Disposition: A | Discharge status: Home / Self Care | Payer: 59 | Attending: Emergency Medicine | Admitting: Emergency Medicine

## 2021-10-06 ENCOUNTER — Encounter (HOSPITAL_COMMUNITY): Payer: Self-pay

## 2021-10-06 ENCOUNTER — Other Ambulatory Visit: Payer: Self-pay

## 2021-10-06 DIAGNOSIS — R339 Retention of urine, unspecified: Secondary | ICD-10-CM | POA: Insufficient documentation

## 2021-10-06 DIAGNOSIS — Z7901 Long term (current) use of anticoagulants: Secondary | ICD-10-CM | POA: Insufficient documentation

## 2021-10-06 DIAGNOSIS — I5040 Unspecified combined systolic (congestive) and diastolic (congestive) heart failure: Secondary | ICD-10-CM | POA: Diagnosis not present

## 2021-10-06 DIAGNOSIS — I2510 Atherosclerotic heart disease of native coronary artery without angina pectoris: Secondary | ICD-10-CM | POA: Insufficient documentation

## 2021-10-06 DIAGNOSIS — Z79899 Other long term (current) drug therapy: Secondary | ICD-10-CM | POA: Insufficient documentation

## 2021-10-06 LAB — URINALYSIS, ROUTINE W REFLEX MICROSCOPIC
Bacteria, UA: NONE SEEN
Bilirubin Urine: NEGATIVE
Glucose, UA: >=500 mg/dL — AB
Hgb urine dipstick: NEGATIVE
Ketones, ur: NEGATIVE mg/dL
Leukocytes,Ua: NEGATIVE
Nitrite: NEGATIVE
Protein, ur: NEGATIVE mg/dL
Specific Gravity, Urine: 1.006 (ref 1.005–1.030)
pH: 7 (ref 5.0–8.0)

## 2021-10-06 MED ORDER — ONDANSETRON 4 MG PO TBDP
8.0000 mg | ORAL_TABLET | Freq: Once | ORAL | Status: AC
  Administered 2021-10-06: 8 mg via ORAL
  Filled 2021-10-06: qty 2

## 2021-10-06 MED ORDER — ONDANSETRON HCL 4 MG PO TABS
4.0000 mg | ORAL_TABLET | Freq: Three times a day (TID) | ORAL | 0 refills | Status: DC | PRN
  Filled 2021-10-06: qty 20, 7d supply, fill #0

## 2021-10-06 NOTE — Telephone Encounter (Signed)
Noted  

## 2021-10-06 NOTE — ED Notes (Signed)
RN reviewed discharge instructions w/ pt. Follow up and prescriptions reviewed, pt had no further questions °

## 2021-10-07 LAB — URINE CULTURE: Culture: NO GROWTH

## 2021-10-07 LAB — COLOGUARD

## 2021-10-09 ENCOUNTER — Ambulatory Visit: Payer: Medicaid Other

## 2021-10-09 ENCOUNTER — Ambulatory Visit: Payer: Self-pay

## 2021-10-12 ENCOUNTER — Encounter: Payer: Self-pay | Admitting: Urology

## 2021-10-12 ENCOUNTER — Ambulatory Visit (INDEPENDENT_AMBULATORY_CARE_PROVIDER_SITE_OTHER): Payer: Medicaid Other | Admitting: Urology

## 2021-10-12 VITALS — BP 96/67 | HR 80

## 2021-10-12 DIAGNOSIS — R339 Retention of urine, unspecified: Secondary | ICD-10-CM

## 2021-10-12 MED ORDER — CIPROFLOXACIN HCL 500 MG PO TABS
500.0000 mg | ORAL_TABLET | Freq: Once | ORAL | Status: AC
  Administered 2021-10-12: 500 mg via ORAL

## 2021-10-12 NOTE — Progress Notes (Signed)
Assessment: 1. Urinary retention     Plan: I reviewed records from her recent ER visit. Foley removed after successful voiding trial. Cipro x1 following Foley removal Return to office in 2 weeks with bladder scan  Chief Complaint:  Chief Complaint  Patient presents with   Urinary Retention    History of Present Illness:  Christine [name] is a 56 y.o. year old female who is seen in consultation from Ladell Pier, MD for evaluation of urinary retention.  She presented to [facility] emergency room on 10/06/2021 unable to urinate.  She was not having any problems prior to this episode.  She was having issues with diarrhea and was taking Imodium and Metamucil.  A Foley catheter was placed with return of 1100 mL.  Urinalysis was unremarkable and urine culture showed no growth. She reports a prior history of urinary retention requiring a Foley catheter approximately 15 years ago.  She associates these episodes with a change in medication. No history of UTIs.  She is having regular bowel movements at the present time.  Her Foley has been draining well.  No gross hematuria.   Past Medical History:  Past Medical History:  Diagnosis Date   Anxiety 01/20/2021   CAD in native artery 08/24/2021   CHF (congestive heart failure) (HCC)    Chronic combined systolic and diastolic heart failure ([employer]) 08/24/2021   Shortness of breath 01/20/2021   Tobacco abuse 01/20/2021    Past Surgical History:  Past Surgical History:  Procedure Laterality Date   ABDOMINAL HYSTERECTOMY     CARDIAC CATHETERIZATION     CHOLECYSTECTOMY     ICD IMPLANT N/A 07/21/2021   Procedure: ICD IMPLANT;  Surgeon: Deboraha Sprang, MD;  Location: [facility];  Service: Cardiovascular;  Laterality: N/A;   NECK SURGERY     OVARIAN CYST SURGERY     RIGHT/LEFT HEART CATH AND CORONARY ANGIOGRAPHY N/A 01/23/2021   Procedure: RIGHT/LEFT HEART CATH AND CORONARY ANGIOGRAPHY;  Surgeon: Jolaine Artist, MD;   Location: [facility];  Service: Cardiovascular;  Laterality: N/A;    Allergies:  Allergies  Allergen Reactions   Sulfa Antibiotics Hives and Itching   Erythromycin Hives   Tramadol Rash and Other (See Comments)    Urinary retention     Tape Rash    Family History:  Family History  Problem Relation Age of Onset   Stroke Mother    Atrial fibrillation Mother    Heart failure Father     Social History:  Social History   Tobacco Use   Smoking status: Former    Packs/day: 0.50    Types: Cigarettes    Quit date: 01/15/2021    Years since quitting: 0.7   Smokeless tobacco: Never  Vaping Use   Vaping Use: Never used  Substance Use Topics   Alcohol use: Yes    Comment: rarely   Drug use: Never    Review of symptoms:  Constitutional:  Negative for unexplained weight loss, night sweats, fever, chills ENT:  Negative for nose bleeds, sinus pain, painful swallowing CV:  Negative for chest pain, shortness of breath, exercise intolerance, palpitations, loss of consciousness Resp:  Negative for cough, wheezing, shortness of breath GI:  Negative for nausea, vomiting, diarrhea, bloody stools GU:  Positives noted in HPI; otherwise negative for gross hematuria, dysuria, urinary incontinence Neuro:  Negative for seizures, poor balance, limb weakness, slurred speech Psych:  Negative for lack of energy, depression, anxiety Endocrine:  Negative for  polydipsia, polyuria, symptoms of hypoglycemia (dizziness, hunger, sweating) Hematologic:  Negative for anemia, purpura, petechia, prolonged or excessive bleeding, use of anticoagulants  Allergic:  Negative for difficulty breathing or choking as a result of exposure to anything; no shellfish allergy; no allergic response (rash/itch) to materials, foods  Physical exam: BP 96/67   Pulse 80  GENERAL APPEARANCE:  Well appearing, well developed, well nourished, NAD HEENT: Atraumatic, Normocephalic, oropharynx clear. NECK: Supple without  lymphadenopathy or thyromegaly. LUNGS: Clear to auscultation bilaterally. HEART: Regular Rate and Rhythm without murmurs, gallops, or rubs. ABDOMEN: Soft, non-tender, No Masses. EXTREMITIES: Moves all extremities well.  Without clubbing, cyanosis, or edema. NEUROLOGIC:  Alert and oriented x 3, normal gait, CN II-XII grossly intact.  MENTAL STATUS:  Appropriate. BACK:  Non-tender to palpation.  No CVAT SKIN:  Warm, dry and intact.    Results: None  Procedure:  VOIDING TRIAL  A voiding trial was performed in the office today.   Volume of sterile water instilled: 400 mL Foley catheter removed intact. Volume voided by patient: 400 mL Instructed to return to office if has not voided by 4 PM

## 2021-10-12 NOTE — Progress Notes (Signed)
Fill and Pull Catheter Removal  Patient is present today for a catheter removal.  Patient was cleaned and prepped in a sterile fashion 424m of sterile water/ saline was instilled into the bladder when the patient felt the urge to urinate. 178mof water was then drained from the balloon.  A 16FR foley cath was removed from the bladder no complications were noted .  Patient as then given some time to void on their own.  Patient can void  40050mn their own after some time.  Patient tolerated well.  Performed by: AmaEstill Bamberg  Follow up/ Additional notes:  MD to see

## 2021-10-14 DIAGNOSIS — Z419 Encounter for procedure for purposes other than remedying health state, unspecified: Secondary | ICD-10-CM | POA: Diagnosis not present

## 2021-10-16 ENCOUNTER — Other Ambulatory Visit (HOSPITAL_COMMUNITY): Payer: Self-pay

## 2021-10-17 ENCOUNTER — Other Ambulatory Visit (HOSPITAL_COMMUNITY): Payer: Self-pay | Admitting: Internal Medicine

## 2021-10-18 ENCOUNTER — Other Ambulatory Visit (HOSPITAL_COMMUNITY): Payer: Self-pay

## 2021-10-18 ENCOUNTER — Other Ambulatory Visit (HOSPITAL_COMMUNITY): Payer: Self-pay | Admitting: Cardiology

## 2021-10-18 ENCOUNTER — Ambulatory Visit (INDEPENDENT_AMBULATORY_CARE_PROVIDER_SITE_OTHER): Payer: 59

## 2021-10-18 DIAGNOSIS — I5042 Chronic combined systolic (congestive) and diastolic (congestive) heart failure: Secondary | ICD-10-CM

## 2021-10-19 ENCOUNTER — Other Ambulatory Visit (HOSPITAL_COMMUNITY): Payer: Self-pay

## 2021-10-19 MED ORDER — SPIRONOLACTONE 25 MG PO TABS
25.0000 mg | ORAL_TABLET | Freq: Every day | ORAL | 3 refills | Status: DC
  Filled 2021-10-19: qty 30, 30d supply, fill #0
  Filled 2021-11-16: qty 30, 30d supply, fill #1
  Filled 2021-12-08: qty 11, 11d supply, fill #2
  Filled 2021-12-11: qty 19, 19d supply, fill #2
  Filled 2022-01-10: qty 30, 30d supply, fill #3

## 2021-10-19 MED ORDER — DIGOXIN 125 MCG PO TABS
0.1250 mg | ORAL_TABLET | Freq: Every day | ORAL | 3 refills | Status: DC
  Filled 2021-10-19: qty 30, 30d supply, fill #0
  Filled 2021-11-16: qty 30, 30d supply, fill #1
  Filled 2021-12-08: qty 30, 30d supply, fill #2
  Filled 2022-01-10: qty 30, 30d supply, fill #3

## 2021-10-20 ENCOUNTER — Other Ambulatory Visit (HOSPITAL_COMMUNITY): Payer: Self-pay

## 2021-10-20 MED ORDER — DAPAGLIFLOZIN PROPANEDIOL 10 MG PO TABS
10.0000 mg | ORAL_TABLET | Freq: Every day | ORAL | 11 refills | Status: DC
  Filled 2021-10-20: qty 30, 30d supply, fill #0
  Filled 2021-11-16 – 2021-11-24 (×2): qty 30, 30d supply, fill #1
  Filled 2021-12-20: qty 30, 30d supply, fill #2
  Filled 2022-01-19: qty 30, 30d supply, fill #3
  Filled 2022-02-26: qty 30, 30d supply, fill #4
  Filled 2022-03-27: qty 30, 30d supply, fill #5
  Filled 2022-04-24: qty 30, 30d supply, fill #6
  Filled 2022-05-24: qty 30, 30d supply, fill #7
  Filled 2022-06-24: qty 30, 30d supply, fill #8
  Filled 2022-07-23: qty 30, 30d supply, fill #9
  Filled 2022-08-17: qty 30, 30d supply, fill #10
  Filled 2022-09-17: qty 30, 30d supply, fill #11

## 2021-10-21 LAB — CUP PACEART REMOTE DEVICE CHECK
Battery Remaining Longevity: 174 mo
Battery Remaining Percentage: 100 %
Brady Statistic RV Percent Paced: 0 %
Date Time Interrogation Session: 20230705060500
HighPow Impedance: 73 Ohm
Implantable Lead Implant Date: 20230407
Implantable Lead Location: 753860
Implantable Lead Model: 138
Implantable Lead Serial Number: 303166
Implantable Pulse Generator Implant Date: 20230407
Lead Channel Impedance Value: 721 Ohm
Lead Channel Pacing Threshold Amplitude: 1.1 V
Lead Channel Pacing Threshold Pulse Width: 0.4 ms
Lead Channel Setting Pacing Amplitude: 3.5 V
Lead Channel Setting Pacing Pulse Width: 0.4 ms
Lead Channel Setting Sensing Sensitivity: 0.5 mV
Pulse Gen Serial Number: 216478

## 2021-10-24 ENCOUNTER — Encounter (HOSPITAL_COMMUNITY): Payer: Self-pay

## 2021-10-24 ENCOUNTER — Other Ambulatory Visit (HOSPITAL_COMMUNITY): Payer: Self-pay

## 2021-10-24 ENCOUNTER — Ambulatory Visit (HOSPITAL_COMMUNITY)
Admission: RE | Admit: 2021-10-24 | Discharge: 2021-10-24 | Disposition: A | Discharge status: Home / Self Care | Payer: 59 | Source: Ambulatory Visit | Attending: Family Medicine | Admitting: Family Medicine

## 2021-10-24 VITALS — BP 100/66 | HR 78 | Wt 189.4 lb

## 2021-10-24 DIAGNOSIS — I2510 Atherosclerotic heart disease of native coronary artery without angina pectoris: Secondary | ICD-10-CM | POA: Insufficient documentation

## 2021-10-24 DIAGNOSIS — Z7901 Long term (current) use of anticoagulants: Secondary | ICD-10-CM | POA: Diagnosis not present

## 2021-10-24 DIAGNOSIS — Z79899 Other long term (current) drug therapy: Secondary | ICD-10-CM | POA: Diagnosis not present

## 2021-10-24 DIAGNOSIS — I5022 Chronic systolic (congestive) heart failure: Secondary | ICD-10-CM | POA: Diagnosis not present

## 2021-10-24 DIAGNOSIS — D751 Secondary polycythemia: Secondary | ICD-10-CM | POA: Diagnosis not present

## 2021-10-24 DIAGNOSIS — I513 Intracardiac thrombosis, not elsewhere classified: Secondary | ICD-10-CM | POA: Diagnosis not present

## 2021-10-24 DIAGNOSIS — F32A Depression, unspecified: Secondary | ICD-10-CM | POA: Diagnosis not present

## 2021-10-24 DIAGNOSIS — F419 Anxiety disorder, unspecified: Secondary | ICD-10-CM | POA: Diagnosis not present

## 2021-10-24 DIAGNOSIS — G4733 Obstructive sleep apnea (adult) (pediatric): Secondary | ICD-10-CM | POA: Diagnosis not present

## 2021-10-24 DIAGNOSIS — Z87891 Personal history of nicotine dependence: Secondary | ICD-10-CM | POA: Diagnosis not present

## 2021-10-24 DIAGNOSIS — R002 Palpitations: Secondary | ICD-10-CM | POA: Insufficient documentation

## 2021-10-24 DIAGNOSIS — Z9989 Dependence on other enabling machines and devices: Secondary | ICD-10-CM | POA: Diagnosis not present

## 2021-10-24 DIAGNOSIS — Z7984 Long term (current) use of oral hypoglycemic drugs: Secondary | ICD-10-CM | POA: Insufficient documentation

## 2021-10-24 DIAGNOSIS — I5042 Chronic combined systolic (congestive) and diastolic (congestive) heart failure: Secondary | ICD-10-CM | POA: Diagnosis not present

## 2021-10-24 MED ORDER — FUROSEMIDE 20 MG PO TABS
ORAL_TABLET | ORAL | 3 refills | Status: DC
  Filled 2021-10-24: qty 210, 30d supply, fill #0

## 2021-10-24 NOTE — Patient Instructions (Signed)
INCREASE Lasix to 80 mg in the AM and 60 mg in the PM  Labs needed in 10-14 days  Your physician wants you to follow-up in: 4 months with Dr Haroldine Laws. You will receive a reminder letter in the mail two months in advance    Do the following things EVERYDAY: Weigh yourself in the morning before breakfast. Write it down and keep it in a log. Take your medicines as prescribed Eat low salt foods--Limit salt (sodium) to 2000 mg per day.  Stay as active as you can everyday Limit all fluids for the day to less than 2 liters  At [facility], you and your health needs are our priority. As part of our continuing mission to provide you with exceptional heart care, we have created designated Provider Care Teams. These Care Teams include your primary Cardiologist (physician) and Advanced Practice Providers (APPs- Physician Assistants and Nurse Practitioners) who all work together to provide you with the care you need, when you need it.   You may see any of the following providers on your designated Care Team at your next follow up: Dr Glori Bickers Dr Haynes Kerns, NP Lyda Jester, [place] [facility] [place], [place] Audry Riles, PharmD   Please be sure to bring in all your medications bottles to every appointment.

## 2021-10-24 NOTE — Progress Notes (Signed)
Advanced Heart Failure Clinic Note   PCP: [facility]: Dr Oval Linsey HF Cardiologist: Dr Haroldine Laws  HPI: Christine [name] is a 56 y.o.female with history of chronic tobacco use, ADHD, anxiety, depression, CAD,  systolic HF   Presented to ED 01/20/21 with increased shortness of breath/tachycardia. Adderrall stopped. Echo with EF 20-25%,  LHC/RHC with single vessel CAD (occluded mLAD) elevated filling pressures and  moderately reduced CO. Digoxin added. Beta blocker stopped.  Discharged to home 01/25/21. Discharge weight 213 pounds.   Echo 05/11/21 EF < 20% LV severely dilated RV ok Mild MR. Referred for ICD.  CPX with very mild HF limitation with elevated Ve/VCO2 slope  S/p [employer] ICD 4/23.  Saw PCP 08/10/21 for dizziness and anxiety. Felt insomnia related, labs unremarkable.  Seen in ED 08/18/21 with sudden onset SOB. CXR and EKG re-assuring, Trop x 2 normal. BNP mildly up, advised admission to further work up. She declined, as symptoms completely resolved, and was elected to discharge home.  Seen in ED 10/06/21 for urinary retention, required foley. Felt to be 2/2 to prazosin and this was stopped.  Today she returns for HF follow up with her fiance. Overall feeling fine. She is not short of breath with walking or ADLs. Waking in the mornings with fast HR, resolves with taking 2 xanax. Denies abnormal bleeding, CP, dizziness, edema, or PND/Orthopnea. Appetite ok. No fever or chills. Weight at home 188-189 pounds. Taking all medications. Not tolerating CPAP well, has been referred to sleep dentistry. Takes extra lasix 3-4x/week. Worried about petechiae on legs that started around the time her cardiac issues started.  Cardiac Testing  - Echo 05/11/21 EF < 20% LV severely dilated RV ok Mild MR   - Echo (10/22): EF 20-25% LV severely dilated, LV apical mural thrombus, RV okay, moderate MR.  - R/LHC (10/22): w/ severe 1v CAD with occlusion of mid LAD., PCW 27, CO  4.7, CI 2.2    - cMRI (10/22): demonstrated subendocardial LGE consistent with prior infarcts in LV basal inferolateral wall, apical anterior/septal/inferior walls and apex. LVEF 22%.  - CPX 3/23: FVC 3.60 (85%)      FEV1 2.89 (87%)        FEV1/FVC 80 (100%)        MVV 171 (157%)   Resting HR: 113 Standing HR: 113 Peak HR: 149   (89 % age predicted max HR)  BP rest: 98/60 Standing BP: 90/62 BP peak: 134/58  Peak VO2: 18.8 (90% predicted peak VO2)  VE/VCO2 slope:  33 Peak RER: 1.14  VE/MVV:  38% O2pulse:  11   (100 % predicted O2pulse)   ROS: All systems negative except as listed in HPI, PMH and Problem List.  SH:  Social History   Socioeconomic History   Marital status: Single    Spouse name: Not on file   Number of children: Not on file   Years of education: 16   Highest education level: Not on file  Occupational History   Not on file  Tobacco Use   Smoking status: Former    Packs/day: 0.50    Types: Cigarettes    Quit date: 01/15/2021    Years since quitting: 0.7   Smokeless tobacco: Never  Vaping Use   Vaping Use: Never used  Substance and Sexual Activity   Alcohol use: Yes    Comment: rarely   Drug use: Never   Sexual activity: Not on file  Other Topics Concern   Not on file  Social History Narrative   Not on file   Social Determinants of Health   Financial Resource Strain: Medium Risk (01/31/2021)   Overall Financial Resource Strain (CARDIA)    Difficulty of Paying Living Expenses: Somewhat hard  Food Insecurity: No Food Insecurity (01/31/2021)   Hunger Vital Sign    Worried About Running Out of Food in the Last Year: Never true    Ran Out of Food in the Last Year: Never true  Transportation Needs: No Transportation Needs (01/31/2021)   PRAPARE - Hydrologist (Medical): No    Lack of Transportation (Non-Medical): No  Physical Activity: Not on file  Stress: Not on file  Social Connections: Not on file  Intimate Partner  Violence: Not on file   FH:  Family History  Problem Relation Age of Onset   Stroke Mother    Atrial fibrillation Mother    Heart failure Father    Past Medical History:  Diagnosis Date   Anxiety 01/20/2021   CAD in native artery 08/24/2021   CHF (congestive heart failure) (HCC)    Chronic combined systolic and diastolic heart failure ([place]) 08/24/2021   Shortness of breath 01/20/2021   Tobacco abuse 01/20/2021   Current Outpatient Medications  Medication Sig Dispense Refill   acetaminophen (TYLENOL) 500 MG tablet Take 1,000 mg by mouth every 6 (six) hours as needed for moderate pain.     ALPRAZolam (XANAX) 1 MG tablet Take 2 mg by mouth 3 (three) times daily.     apixaban (ELIQUIS) 5 MG TABS tablet Take 1 tablet (5 mg total) by mouth 2 (two) times daily. 60 tablet 0   atorvastatin (LIPITOR) 80 MG tablet Take 1 tablet (80 mg total) by mouth daily. 30 tablet 3   cyclobenzaprine (FLEXERIL) 10 MG tablet Patient takes 2 tablets by mouth at bedtime.     dapagliflozin propanediol (FARXIGA) 10 MG TABS tablet Take 1 tablet (10 mg total) by mouth daily. 30 tablet 11   digoxin (LANOXIN) 0.125 MG tablet Take 1 tablet (0.125 mg total) by mouth daily. 30 tablet 3   diphenhydrAMINE (BENADRYL) 25 MG tablet Take 25 mg by mouth every 6 (six) hours as needed for allergies.     furosemide (LASIX) 20 MG tablet Take 3 tablets (60 mg total) by mouth 2 (two) times daily. 180 tablet 3   ivabradine (CORLANOR) 5 MG TABS tablet Take 1 tablet (5 mg total) by mouth 2 (two) times daily with a meal. 60 tablet 6   loperamide (IMODIUM A-D) 2 MG tablet Take 2-4 mg by mouth 4 (four) times daily as needed for diarrhea or loose stools.     losartan (COZAAR) 25 MG tablet Take 12.5 mg by mouth daily.     metoprolol succinate (TOPROL-XL) 25 MG 24 hr tablet Take 1 tablet (25 mg total) by mouth daily. Take with or immediately following a meal. 30 tablet 11   metroNIDAZOLE (METROGEL) 1 % gel Apply 1 application topically daily.  (Patient taking differently: Apply 1 application  topically daily as needed (rosaecea).) 60 g 0   ondansetron (ZOFRAN) 4 MG tablet Take 1 tablet (4 mg total) by mouth every 8 (eight) hours as needed for nausea or vomiting. 20 tablet 0   oxymetazoline (AFRIN) 0.05 % nasal spray Place 2 sprays into both nostrils 2 (two) times daily as needed for congestion.     Potassium Chloride (KLOR-CON PO) Take 20 mEq by mouth. twice a day.     spironolactone (ALDACTONE)  25 MG tablet Take 1 tablet (25 mg total) by mouth daily. 30 tablet 3   venlafaxine XR (EFFEXOR-XR) 75 MG 24 hr capsule Take 75 mg by mouth 3 (three) times daily.     zolpidem (AMBIEN) 10 MG tablet Take 5-10 mg by mouth at bedtime.     No current facility-administered medications for this encounter.   BP 100/66   Pulse 78   Wt 85.9 kg (189 lb 6.4 oz)   SpO2 97%   BMI 27.18 kg/m   Wt Readings from Last 3 Encounters:  10/24/21 85.9 kg (189 lb 6.4 oz)  10/06/21 87.5 kg (193 lb)  09/25/21 87.1 kg (192 lb)   PHYSICAL EXAM: General:  NAD. No resp difficulty HEENT: Normal Neck: Supple. No JVD. Carotids 2+ bilat; no bruits. No lymphadenopathy or thryomegaly appreciated. Cor: PMI nondisplaced. Regular rate & rhythm. No rubs, gallops or murmurs. Lungs: Clear Abdomen: Soft, nontender, nondistended. No hepatosplenomegaly. No bruits or masses. Good bowel sounds. Extremities: No cyanosis, clubbing, rash, edema; faint petechiae on legs Neuro: Alert & oriented x 3, cranial nerves grossly intact. Moves all 4 extremities w/o difficulty. Affect pleasant.  ICD interrogation (personally reviewed): HL Score 3, 1.4 hrs/day activity, mean HR 66 bpm, no VT  ASSESSMENT & PLAN: 1. 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 Echo EF 20-25%, severely dilated LV, RV okay, moderate MR - R/LHC (10/22): 1v CAD occluded mid LAD PCWP 27,  Fick 4.7/2.  - cMRI EF 22% subendocardial LGE consistent with prior infarcts in LV basal inferolateral  wall, apical anterior/septal/inferior walls and apex. No viability. RV okay. Not sure how to explain inferior defects on cMRI  - Echo (05/11/21) EF < 20% RV ok - CPX 1/23 with very mild HF limitation:  Peak VO2: 18.8 (90% predicted peak VO2) VE/VCO2 slope: 33 Peak RER: 1.14  - s/p [employer] ICD 4/23. - Improved NYHA I-II. Volume status looks good.  - Increase Lasix to 80/60 since she is taking extra lasix frequently throughout the week. - Continue Ivabradine 5 mg bid. HR 78 today. - Continue Toprol XL 25 mg daily. - Continue losartan 12.5 mg daily (no BP room for Entresto). - Continue digoxin 0.125 mg daily. Last dig level 0.5 (09/20/21) - Continue Farxiga 10 mg daily.  - Continue spiro 25 mg daily. - Needs to remain off adderall with reduced EF. - Recent labs OK, K 3.8 and SCr 0.87. Repeat BMET in 2 weeks.   2. Palpitations - No longer on Adderall. Drinks 1 Pepsi a day  - Zio 14-day (11/22) showed mostly SR, no high-grade arrhythmias.  3. CAD - Single vessel LAD occlusion on LHC. - No s/s ischemia. - Continue statin. - No ASA with Eliquis.   4. Tobacco use - Remains quit from cigarettes.  5. LV apical thrombus - Noted on echo/cMRI.  - On Eliquis. No bleeding issues.   6. OSA - Mild on sleep study, AHI 11.8 - Struggling with CPAP.  - Follows with Dr. Radford Pax, referred to sleep dentistry to discuss oral device.  7. Polycythemia - Follows with Heme/Onc. - JAK negative - ? Etiology of faint, asymptomatic petechiae on legs. Declines referral to Derm. - Discussed with PharmD about possible SE from Eliquis, would not stop Eliquis as labs are stable and she is asymptomatic.  Follow up in 3-4 months with Dr. Haroldine Laws.   [place] FNP-BC 1:47 PM

## 2021-10-26 ENCOUNTER — Encounter: Payer: Self-pay | Admitting: Urology

## 2021-10-26 ENCOUNTER — Encounter: Payer: 59 | Admitting: Internal Medicine

## 2021-10-26 ENCOUNTER — Ambulatory Visit (INDEPENDENT_AMBULATORY_CARE_PROVIDER_SITE_OTHER): Payer: 59 | Admitting: Urology

## 2021-10-26 VITALS — BP 93/65 | HR 84 | Ht 70.0 in | Wt 188.0 lb

## 2021-10-26 DIAGNOSIS — R339 Retention of urine, unspecified: Secondary | ICD-10-CM | POA: Diagnosis not present

## 2021-10-26 DIAGNOSIS — Z87898 Personal history of other specified conditions: Secondary | ICD-10-CM

## 2021-10-26 LAB — URINALYSIS, ROUTINE W REFLEX MICROSCOPIC
Bilirubin, UA: NEGATIVE
Ketones, UA: NEGATIVE
Leukocytes,UA: NEGATIVE
Nitrite, UA: NEGATIVE
Protein,UA: NEGATIVE
RBC, UA: NEGATIVE
Specific Gravity, UA: <1.005 — ABNORMAL LOW (ref 1.005–1.030)
Urobilinogen, Ur: 1 mg/dL (ref 0.2–1.0)
pH, UA: 5.5 (ref 5.0–7.5)

## 2021-10-26 LAB — BLADDER SCAN AMB NON-IMAGING: Scan Result: 0

## 2021-10-26 NOTE — Progress Notes (Signed)
post void residual =0mL 

## 2021-10-26 NOTE — Progress Notes (Signed)
Assessment: 1. History of urinary retention     Plan: Return to office prn  Chief Complaint:  Chief Complaint  Patient presents with   Urinary Retention    History of Present Illness:  Christine [name] is a 56 y.o. year old female who is seen for further evaluation of urinary retention.  She presented to [facility] emergency room on 10/06/2021 unable to urinate.  She was not having any problems prior to this episode.  She was having issues with diarrhea and was taking Imodium and Metamucil.  A Foley catheter was placed with return of 1100 mL.  Urinalysis was unremarkable and urine culture showed no growth. She reports a prior history of urinary retention requiring a Foley catheter approximately 15 years ago.  She associates these episodes with a change in medication. No history of UTIs.  She is having regular bowel movements at the present time.  Her Foley was removed on 10/12/2021 after a successful voiding trial.  She returns today for follow-up.  She is not having any voiding issues at the present time.  No dysuria or gross hematuria.  Portions of the above documentation were copied from a prior visit for review purposes only.   Past Medical History:  Past Medical History:  Diagnosis Date   Anxiety 01/20/2021   CAD in native artery 08/24/2021   CHF (congestive heart failure) (HCC)    Chronic combined systolic and diastolic heart failure ([place]) 08/24/2021   Shortness of breath 01/20/2021   Tobacco abuse 01/20/2021    Past Surgical History:  Past Surgical History:  Procedure Laterality Date   ABDOMINAL HYSTERECTOMY     CARDIAC CATHETERIZATION     CHOLECYSTECTOMY     ICD IMPLANT N/A 07/21/2021   Procedure: ICD IMPLANT;  Surgeon: Deboraha Sprang, MD;  Location: [facility];  Service: Cardiovascular;  Laterality: N/A;   NECK SURGERY     OVARIAN CYST SURGERY     RIGHT/LEFT HEART CATH AND CORONARY ANGIOGRAPHY N/A 01/23/2021   Procedure: RIGHT/LEFT HEART CATH AND  CORONARY ANGIOGRAPHY;  Surgeon: Jolaine Artist, MD;  Location: [facility];  Service: Cardiovascular;  Laterality: N/A;    Allergies:  Allergies  Allergen Reactions   Sulfa Antibiotics Hives and Itching   Erythromycin Hives   Tramadol Rash and Other (See Comments)    Urinary retention     Tape Rash    Family History:  Family History  Problem Relation Age of Onset   Stroke Mother    Atrial fibrillation Mother    Heart failure Father     Social History:  Social History   Tobacco Use   Smoking status: Former    Packs/day: 0.50    Types: Cigarettes    Quit date: 01/15/2021    Years since quitting: 0.7   Smokeless tobacco: Never  Vaping Use   Vaping Use: Never used  Substance Use Topics   Alcohol use: Yes    Comment: rarely   Drug use: Never    ROS: Constitutional:  Negative for fever, chills, weight loss CV: Negative for chest pain, previous MI, hypertension Respiratory:  Negative for shortness of breath, wheezing, sleep apnea, frequent cough GI:  Negative for nausea, vomiting, bloody stool, GERD  Physical exam: BP 93/65   Pulse 84   Ht 5' 10"  (1.778 m)   Wt 188 lb (85.3 kg)   BMI 26.98 kg/m  GENERAL APPEARANCE:  Well appearing, well developed, well nourished, NAD HEENT:  Atraumatic, normocephalic, oropharynx clear  NECK:  Supple without lymphadenopathy or thyromegaly ABDOMEN:  Soft, non-tender, no masses EXTREMITIES:  Moves all extremities well, without clubbing, cyanosis, or edema NEUROLOGIC:  Alert and oriented x 3, normal gait, CN II-XII grossly intact MENTAL STATUS:  appropriate BACK:  Non-tender to palpation, No CVAT SKIN:  Warm, dry, and intact  Results: U/A: 3+ glucose   PVR = 0 ml

## 2021-11-03 ENCOUNTER — Ambulatory Visit (HOSPITAL_COMMUNITY)
Admission: RE | Admit: 2021-11-03 | Discharge: 2021-11-03 | Disposition: A | Discharge status: Home / Self Care | Payer: 59 | Source: Ambulatory Visit | Attending: Internal Medicine | Admitting: Internal Medicine

## 2021-11-03 DIAGNOSIS — I5042 Chronic combined systolic (congestive) and diastolic (congestive) heart failure: Secondary | ICD-10-CM | POA: Insufficient documentation

## 2021-11-03 LAB — BASIC METABOLIC PANEL
Anion gap: 8 (ref 5–15)
BUN: 9 mg/dL (ref 6–20)
CO2: 30 mmol/L (ref 22–32)
Calcium: 9.1 mg/dL (ref 8.9–10.3)
Chloride: 99 mmol/L (ref 98–111)
Creatinine, Ser: 0.92 mg/dL (ref 0.44–1.00)
GFR, Estimated: >60 mL/min (ref >60)
Glucose, Bld: 138 mg/dL — ABNORMAL HIGH (ref 70–99)
Potassium: 3.3 mmol/L — ABNORMAL LOW (ref 3.5–5.1)
Sodium: 137 mmol/L (ref 135–145)

## 2021-11-04 ENCOUNTER — Other Ambulatory Visit: Payer: Self-pay | Admitting: Internal Medicine

## 2021-11-06 ENCOUNTER — Other Ambulatory Visit (HOSPITAL_COMMUNITY): Payer: Self-pay | Admitting: Internal Medicine

## 2021-11-06 ENCOUNTER — Other Ambulatory Visit (HOSPITAL_COMMUNITY): Payer: Self-pay

## 2021-11-06 MED ORDER — LOSARTAN POTASSIUM 25 MG PO TABS
12.5000 mg | ORAL_TABLET | Freq: Every day | ORAL | 3 refills | Status: DC
  Filled 2021-11-06: qty 15, 30d supply, fill #0
  Filled 2021-12-01: qty 15, 30d supply, fill #1
  Filled 2022-01-01: qty 15, 30d supply, fill #2
  Filled 2022-01-29: qty 15, 30d supply, fill #3
  Filled 2022-02-26: qty 15, 30d supply, fill #4

## 2021-11-06 MED ORDER — FUROSEMIDE 20 MG PO TABS
ORAL_TABLET | ORAL | 3 refills | Status: DC
  Filled 2021-11-06 – 2021-11-07 (×2): qty 210, 30d supply, fill #0
  Filled 2021-12-01 (×3): qty 210, 30d supply, fill #1
  Filled 2021-12-20: qty 210, 30d supply, fill #2

## 2021-11-07 ENCOUNTER — Other Ambulatory Visit (HOSPITAL_COMMUNITY): Payer: Self-pay

## 2021-11-08 ENCOUNTER — Other Ambulatory Visit (HOSPITAL_COMMUNITY): Payer: Self-pay

## 2021-11-08 NOTE — Progress Notes (Signed)
Remote ICD transmission.   

## 2021-11-09 ENCOUNTER — Other Ambulatory Visit (HOSPITAL_COMMUNITY): Payer: Self-pay

## 2021-11-10 ENCOUNTER — Other Ambulatory Visit (HOSPITAL_COMMUNITY): Payer: Self-pay

## 2021-11-10 MED ORDER — APIXABAN 5 MG PO TABS
5.0000 mg | ORAL_TABLET | Freq: Two times a day (BID) | ORAL | 4 refills | Status: DC
  Filled 2022-06-12: qty 60, 30d supply, fill #0

## 2021-11-13 ENCOUNTER — Other Ambulatory Visit (HOSPITAL_COMMUNITY): Payer: Self-pay

## 2021-11-13 ENCOUNTER — Ambulatory Visit: Payer: Self-pay

## 2021-11-13 DIAGNOSIS — R197 Diarrhea, unspecified: Secondary | ICD-10-CM

## 2021-11-13 NOTE — Telephone Encounter (Signed)
   Chief Complaint: Continued diarrhea since May. Has 1 episode a week. Has tried Metamucil, did not help.Explosive and watery. Asking what else she needs to do. Does she need to see GI? Symptoms: Above Frequency: May Pertinent Negatives: Patient denies vomiting Disposition: [] ED /[] Urgent Care (no appt availability in office) / [] Appointment(In office/virtual)/ []  [facility]/ [] Home Care/ [] Refused Recommended Disposition /[] [facility]/ [x]  Follow-up with PCP Additional Notes: Please advise pt.  Answer Assessment - Initial Assessment Questions 1. DIARRHEA SEVERITY: "How bad is the diarrhea?" "How many more stools have you had in the past 24 hours than normal?"    - NO DIARRHEA (SCALE 0)   - MILD (SCALE 1-3): Few loose or mushy BMs; increase of 1-3 stools over normal daily number of stools; mild increase in ostomy output.   -  MODERATE (SCALE 4-7): Increase of 4-6 stools daily over normal; moderate increase in ostomy output.   -  SEVERE (SCALE 8-10; OR "WORST POSSIBLE"): Increase of 7 or more stools daily over normal; moderate increase in ostomy output; incontinence.     Not every day - 1 x a week 2. ONSET: "When did the diarrhea begin?"      May 3. BM CONSISTENCY: "How loose or watery is the diarrhea?"      Water 4. VOMITING: "Are you also vomiting?" If Yes, ask: "How many times in the past 24 hours?"      No 5. ABDOMEN PAIN: "Are you having any abdomen pain?" If Yes, ask: "What does it feel like?" (e.g., crampy, dull, intermittent, constant)      Mild 6. ABDOMEN PAIN SEVERITY: If present, ask: "How bad is the pain?"  (e.g., Scale 1-10; mild, moderate, or severe)   - MILD (1-3): doesn't interfere with normal activities, abdomen soft and not tender to touch    - MODERATE (4-7): interferes with normal activities or awakens from sleep, abdomen tender to touch    - SEVERE (8-10): excruciating pain, doubled over, unable to do any normal activities       Mild 7. ORAL  INTAKE: If vomiting, "Have you been able to drink liquids?" "How much liquids have you had in the past 24 hours?"     Yes 8. HYDRATION: "Any signs of dehydration?" (e.g., dry mouth [not just dry lips], too weak to stand, dizziness, new weight loss) "When did you last urinate?"     No 9. EXPOSURE: "Have you traveled to a foreign country recently?" "Have you been exposed to anyone with diarrhea?" "Could you have eaten any food that was spoiled?"     No 10. ANTIBIOTIC USE: "Are you taking antibiotics now or have you taken antibiotics in the past 2 months?"       No 11. OTHER SYMPTOMS: "Do you have any other symptoms?" (e.g., fever, blood in stool)       No 12. PREGNANCY: "Is there any chance you are pregnant?" "When was your last menstrual period?"       No  Protocols used: [facility]

## 2021-11-14 ENCOUNTER — Other Ambulatory Visit (HOSPITAL_COMMUNITY): Payer: Self-pay

## 2021-11-14 ENCOUNTER — Ambulatory Visit: Payer: Self-pay

## 2021-11-14 DIAGNOSIS — Z419 Encounter for procedure for purposes other than remedying health state, unspecified: Secondary | ICD-10-CM | POA: Diagnosis not present

## 2021-11-14 NOTE — Telephone Encounter (Signed)
See addendum note from yesterday triage.

## 2021-11-14 NOTE — Telephone Encounter (Signed)
Pt. States she has not heard from the practice yet. Reports she has noticed she has nausea after episodes of diarrhea. Requesting medication be sent to her pharmacy. Please advise pt.

## 2021-11-15 ENCOUNTER — Telehealth: Payer: Self-pay | Admitting: Internal Medicine

## 2021-11-15 MED ORDER — ONDANSETRON HCL 4 MG PO TABS
4.0000 mg | ORAL_TABLET | Freq: Every day | ORAL | 0 refills | Status: DC | PRN
  Filled 2021-11-15: qty 20, 20d supply, fill #0

## 2021-11-15 NOTE — Addendum Note (Signed)
Addended by: Karle Plumber B on: 11/15/2021 09:56 AM   Modules accepted: Orders

## 2021-11-15 NOTE — Addendum Note (Signed)
Addended by: Karle Plumber B on: 11/15/2021 08:54 PM   Modules accepted: Orders

## 2021-11-15 NOTE — Telephone Encounter (Signed)
Pt called to see if a medication for vomiting and nausea was going to be called in for her today / please advise asap / she stated she mentioned this during her call with NT

## 2021-11-16 ENCOUNTER — Telehealth: Payer: Self-pay | Admitting: Cardiology

## 2021-11-16 ENCOUNTER — Other Ambulatory Visit (HOSPITAL_COMMUNITY): Payer: Self-pay

## 2021-11-16 ENCOUNTER — Telehealth (HOSPITAL_COMMUNITY): Payer: Self-pay | Admitting: Pharmacy Technician

## 2021-11-16 ENCOUNTER — Encounter: Payer: 59 | Admitting: Internal Medicine

## 2021-11-16 ENCOUNTER — Encounter: Payer: Self-pay | Admitting: Cardiovascular Disease

## 2021-11-16 NOTE — Telephone Encounter (Signed)
Patient Advocate Encounter   Received notification from [facility] that prior authorization for Wilder Glade is required.   PA submitted on CoverMyMeds Key [id number] Status is pending   Will continue to follow.

## 2021-11-16 NOTE — Telephone Encounter (Signed)
Patient given instruction per PCP.  Verbalizes understanding.   Education provided sx's that would warrant ED visit: palpitations, cramping, chest pain, dizziness. Patient verbalizes understanding.

## 2021-11-16 NOTE — Telephone Encounter (Signed)
error 

## 2021-11-16 NOTE — Telephone Encounter (Signed)
DME needs her last OV notes form 09/25/21 faxed to them. It can be faxed at [phone]

## 2021-11-16 NOTE — Telephone Encounter (Signed)
Advanced Heart Failure Patient Advocate Encounter  Prior Authorization for Wilder Glade has been approved.    PA#  [id number] Effective dates: 11/16/21 through 11/16/22  Charlann Boxer, CPhT

## 2021-11-17 LAB — COLOGUARD: COLOGUARD: NEGATIVE

## 2021-11-17 NOTE — Telephone Encounter (Signed)
Notes faxed per request

## 2021-11-21 ENCOUNTER — Other Ambulatory Visit (HOSPITAL_COMMUNITY): Payer: Self-pay

## 2021-11-22 ENCOUNTER — Other Ambulatory Visit (HOSPITAL_COMMUNITY): Payer: Self-pay

## 2021-11-23 ENCOUNTER — Ambulatory Visit: Payer: Medicaid Other | Attending: Internal Medicine | Admitting: Internal Medicine

## 2021-11-23 ENCOUNTER — Encounter: Payer: Self-pay | Admitting: Internal Medicine

## 2021-11-23 VITALS — BP 94/60 | HR 84 | Temp 98.1°F | Ht 70.0 in | Wt 186.2 lb

## 2021-11-23 DIAGNOSIS — R197 Diarrhea, unspecified: Secondary | ICD-10-CM | POA: Diagnosis not present

## 2021-11-23 NOTE — Patient Instructions (Signed)
I will have our referral coordinator try to get you in with the gastroenterologist at an earlier date. We will check stool cultures as discussed today.  If cultures are negative, we may have you return to do a 24-hour urine collection to check for a syndrome called carcinoid syndrome that can present with explosive diarrhea and flushing.

## 2021-11-23 NOTE — Progress Notes (Signed)
Patient ID: Christine [name], female    DOB: 12/12/1965  MRN: [id number]   CC: GI Problem     Subjective: Christine [name] is a 56 y.o. female who presents for UC visit Her concerns today include:  Patient with history of  combined CHF EF 20-25%, CAD with occlusion of mid LAD, left ventricular apical thrombus, obesity former smoker,, HL, anxiety, ADHD, depression, PTSD.Marland Kitchen   Pt c/o intermittent explosive diarrhea occurring Q7-10 days since May. Would last at least 1 day.  So bad that she has incontinence when it occurs. Last episode occurred while laying in bed. Has to wear Depends -Some pain in LT lower abdomen when she is having an episode. Takes Imodium 2 tabs with onset then 2 after each episode Last 2 episode associated with nausea.  Now nausea all the time.  Has to take Zofran at least 3 times a day. Not able to eat very much since last bout about 1.5 wks ago. -drinks 1 Pepsi a day for quite a while.   -no blood in stools or fever -she has not associated her symptoms with any particular types of food.  She has stopped eating as much because of having the diarrhea -Effexor stopped on 11/08/2021.  Started on Pristiq -no recent travel.   Past 3 mornings she has been waking up feeling startled and feeling hot.  Very temp sensitive.      Patient Active Problem List    Diagnosis Date Noted   Chronic combined systolic and diastolic heart failure ([place]) 08/24/2021   CAD in native artery 08/24/2021   S/P ICD (internal cardiac defibrillator) procedure 08/10/2021   Former smoker 02/06/2021   Left ventricular apical thrombus 02/06/2021   Obesity (BMI 30.0-34.9) 02/06/2021   Anxiety 01/20/2021   Tobacco abuse 01/20/2021   Shortness of breath 01/20/2021   Chest tightness              Current Outpatient Medications on File Prior to Visit  Medication Sig Dispense Refill   acetaminophen (TYLENOL) 500 MG tablet Take 1,000 mg by mouth every 6 (six) hours as needed for moderate pain.        ALPRAZolam (XANAX) 1 MG tablet Take 2 mg by mouth 3 (three) times daily.       apixaban (ELIQUIS) 5 MG TABS tablet Take 1 tablet (5 mg total) by mouth 2 (two) times daily. 60 tablet 7   atorvastatin (LIPITOR) 80 MG tablet Take 1 tablet (80 mg total) by mouth daily. 30 tablet 3   cyclobenzaprine (FLEXERIL) 10 MG tablet Patient takes 2 tablets by mouth at bedtime.       dapagliflozin propanediol (FARXIGA) 10 MG TABS tablet Take 1 tablet (10 mg total) by mouth daily. 30 tablet 11   digoxin (LANOXIN) 0.125 MG tablet Take 1 tablet (0.125 mg total) by mouth daily. 30 tablet 3   diphenhydrAMINE (BENADRYL) 25 MG tablet Take 25 mg by mouth every 6 (six) hours as needed for allergies.       furosemide (LASIX) 20 MG tablet Take 4 tablets (80 mg total) by mouth in the morning AND 3 tablets (60 mg total) every evening. 210 tablet 3   ivabradine (CORLANOR) 5 MG TABS tablet Take 1 tablet (5 mg total) by mouth 2 (two) times daily with a meal. 60 tablet 6   loperamide (IMODIUM A-D) 2 MG tablet Take 2-4 mg by mouth 4 (four) times daily as needed for diarrhea or loose stools.  losartan (COZAAR) 25 MG tablet Take 0.5 tablets (12.5 mg total) by mouth daily. 30 tablet 3   metoprolol succinate (TOPROL-XL) 25 MG 24 hr tablet Take 1 tablet (25 mg total) by mouth daily. Take with or immediately following a meal. 30 tablet 11   metroNIDAZOLE (METROGEL) 1 % gel Apply 1 application topically daily. (Patient taking differently: Apply 1 application  topically daily as needed (rosaecea).) 60 g 0   ondansetron (ZOFRAN) 4 MG tablet Take 1 tablet (4 mg total) by mouth daily as needed for nausea or vomiting. 20 tablet 0   oxymetazoline (AFRIN) 0.05 % nasal spray Place 2 sprays into both nostrils 2 (two) times daily as needed for congestion.       Potassium Chloride (KLOR-CON PO) Take 20 mEq by mouth. twice a day.       spironolactone (ALDACTONE) 25 MG tablet Take 1 tablet (25 mg total) by mouth daily. 30 tablet 3   zolpidem  (AMBIEN) 10 MG tablet Take 5-10 mg by mouth at bedtime.       venlafaxine XR (EFFEXOR-XR) 75 MG 24 hr capsule Take 75 mg by mouth 3 (three) times daily.        No current facility-administered medications on file prior to visit.           Allergies  Allergen Reactions   Sulfa Antibiotics Hives and Itching   Erythromycin Hives   Tramadol Rash and Other (See Comments)      Urinary retention       Tape Rash      Social History         Socioeconomic History   Marital status: Single      Spouse name: Not on file   Number of children: Not on file   Years of education: 16   Highest education level: Not on file  Occupational History   Not on file  Tobacco Use   Smoking status: Former      Packs/day: 0.50      Types: Cigarettes      Quit date: 01/15/2021      Years since quitting: 0.8   Smokeless tobacco: Never  Vaping Use   Vaping Use: Never used  Substance and Sexual Activity   Alcohol use: Yes      Comment: rarely   Drug use: Never   Sexual activity: Not on file  Other Topics Concern   Not on file  Social History Narrative   Not on file    Social Determinants of Health        Financial Resource Strain: Medium Risk (01/31/2021)    Overall Financial Resource Strain (CARDIA)     Difficulty of Paying Living Expenses: Somewhat hard  Food Insecurity: No Food Insecurity (01/31/2021)    Hunger Vital Sign     Worried About Running Out of Food in the Last Year: Never true     Ran Out of Food in the Last Year: Never true  Transportation Needs: No Transportation Needs (01/31/2021)    PRAPARE - Armed forces logistics/support/administrative officer (Medical): No     Lack of Transportation (Non-Medical): No  Physical Activity: Not on file  Stress: Not on file  Social Connections: Not on file  Intimate Partner Violence: Not on file           Family History  Problem Relation Age of Onset   Stroke Mother     Atrial fibrillation Mother     Heart failure Father  Past  Surgical History:  Procedure Laterality Date   ABDOMINAL HYSTERECTOMY       CARDIAC CATHETERIZATION       CHOLECYSTECTOMY       ICD IMPLANT N/A 07/21/2021    Procedure: ICD IMPLANT;  Surgeon: Deboraha Sprang, MD;  Location: [facility];  Service: Cardiovascular;  Laterality: N/A;   NECK SURGERY       OVARIAN CYST SURGERY       RIGHT/LEFT HEART CATH AND CORONARY ANGIOGRAPHY N/A 01/23/2021    Procedure: RIGHT/LEFT HEART CATH AND CORONARY ANGIOGRAPHY;  Surgeon: Jolaine Artist, MD;  Location: [facility];  Service: Cardiovascular;  Laterality: N/A;      ROS: Review of Systems Negative except as stated above   PHYSICAL EXAM: BP 94/60   Pulse 84   Temp 98.1 F (36.7 C) (Oral)   Ht 5' 10"  (1.778 m)   Wt 186 lb 3.2 oz (84.5 kg)   SpO2 95%   BMI 26.72 kg/m      Wt Readings from Last 3 Encounters:  11/23/21 186 lb 3.2 oz (84.5 kg)  10/26/21 188 lb (85.3 kg)  10/24/21 189 lb 6.4 oz (85.9 kg)      Physical Exam   General appearance - alert, well appearing, and in no distress Mental status -patient appears a little anxious. Mouth - mucous membranes moist, pharynx normal without lesions Abdomen - soft, nontender, nondistended, no masses or organomegaly        Latest Ref Rng & Units 11/03/2021   11:32 AM 09/20/2021    3:04 PM 09/05/2021    2:51 PM  CMP  Glucose 70 - 99 mg/dL 138  96     BUN 6 - 20 mg/dL 9  16     Creatinine 0.44 - 1.00 mg/dL 0.92  0.87     Sodium 135 - 145 mmol/L 137  143     Potassium 3.5 - 5.1 mmol/L 3.3  3.9  3.3   Chloride 98 - 111 mmol/L 99  104     CO2 22 - 32 mmol/L 30  30     Calcium 8.9 - 10.3 mg/dL 9.1  9.7     Total Protein 6.5 - 8.1 g/dL   8.1     Total Bilirubin 0.3 - 1.2 mg/dL   0.5     Alkaline Phos 38 - 126 U/L   112     AST 15 - 41 U/L   21     ALT 0 - 44 U/L   29       Lipid Panel  Labs (Brief)          Component Value Date/Time    CHOL 216 (H) 01/24/2021 0223    TRIG 179 (H) 01/24/2021 0223    HDL 43 01/24/2021  0223    CHOLHDL 5.0 01/24/2021 0223    VLDL 36 01/24/2021 0223    LDLCALC 137 (H) 01/24/2021 0223        CBC Labs (Brief)          Component Value Date/Time    WBC 9.8 09/20/2021 1504    WBC 14.8 (H) 08/18/2021 1510    RBC 5.31 (H) 09/20/2021 1505    RBC 5.22 (H) 09/20/2021 1504    HGB 15.3 (H) 09/20/2021 1504    HGB 17.5 (H) 08/10/2021 1023    HCT 45.3 09/20/2021 1504    HCT 50.7 (H) 08/10/2021 1023    PLT 330 09/20/2021 1504  PLT 374 08/10/2021 1023    MCV 86.8 09/20/2021 1504    MCV 86 08/10/2021 1023    MCH 29.3 09/20/2021 1504    MCHC 33.8 09/20/2021 1504    RDW 13.4 09/20/2021 1504    RDW 13.1 08/10/2021 1023    LYMPHSABS 2.1 09/20/2021 1504    LYMPHSABS 1.8 08/10/2021 1023    MONOABS 0.6 09/20/2021 1504    EOSABS 0.2 09/20/2021 1504    EOSABS 0.3 08/10/2021 1023    BASOSABS 0.1 09/20/2021 1504    BASOSABS 0.1 08/10/2021 1023        ASSESSMENT AND PLAN:  1. Diarrhea, unspecified type DDX include med induce vs infectious (seems less likely but we will screen for them) vs carcinoid syndrome vs hyperthyroid I went through her med list trying to figure out whether any of her current medications may be causing it.  Most of these medicines she was on prior to this issue.  Effexor can cause some nausea but that was stopped.  Pristiq can also cause nausea but that was just recently started. I will have our referral coordinator try to move up her appointment with the gastroenterologist that is scheduled for later this month. Advised to avoid taking Zofran more than twice a day as it can cause prolonged QT interval.  Continue Imodium as needed Advised to eat smaller but more frequent meals and to keep a food diary of what she ate before each diarrhea episode. Advised to hold off on taking Farxiga until diarrhea resolves - Cdiff NAA+O+P+Stool Culture - Basic Metabolic Panel - [id number]      Patient was given the opportunity to ask questions.  Patient verbalized  understanding of the plan and was able to repeat key elements of the plan.    This documentation was completed using Radio producer.  Any transcriptional errors are unintentional.   No orders of the defined types were placed in this encounter.       Requested Prescriptions      No prescriptions requested or ordered in this encounter      No follow-ups on file.   Karle Plumber, MD, FACP       Orders Placed This Encounter  Procedures   Cdiff NAA+O+P+Stool Culture   Basic Metabolic Panel   [id number]     Requested Prescriptions    No prescriptions requested or ordered in this encounter    No follow-ups on file.  Karle Plumber, MD, FACP

## 2021-11-24 ENCOUNTER — Other Ambulatory Visit (HOSPITAL_COMMUNITY): Payer: Self-pay

## 2021-11-24 LAB — BASIC METABOLIC PANEL
BUN/Creatinine Ratio: 17 (ref 9–23)
BUN: 15 mg/dL (ref 6–24)
CO2: 25 mmol/L (ref 20–29)
Calcium: 10.2 mg/dL (ref 8.7–10.2)
Chloride: 96 mmol/L (ref 96–106)
Creatinine, Ser: 0.87 mg/dL (ref 0.57–1.00)
Glucose: 95 mg/dL (ref 70–99)
Potassium: 4 mmol/L (ref 3.5–5.2)
Sodium: 139 mmol/L (ref 134–144)
eGFR: 79 mL/min/{1.73_m2} (ref >59)

## 2021-11-24 LAB — TSH+T4F+T3FREE
Free T4: 1.25 ng/dL (ref 0.82–1.77)
T3, Free: 3.2 pg/mL (ref 2.0–4.4)
TSH: 1.83 u[IU]/mL (ref 0.450–4.500)

## 2021-11-27 ENCOUNTER — Ambulatory Visit: Payer: Self-pay

## 2021-11-27 NOTE — Telephone Encounter (Signed)
Please call patient and inform to return the stool sample that was ordered and her last visit with Dr.Johnson, and to reach out to her GI doctor to see if she can get an earlier appointment.

## 2021-11-27 NOTE — Telephone Encounter (Signed)
      Chief Complaint: Continued diarrhea. Seen 11/23/21. Symptoms: Started Saturday Frequency: Saturday Pertinent Negatives: Patient denies pain. Has cramps when having diarrhea Disposition: [] ED /[] Urgent Care (no appt availability in office) / [] Appointment(In office/virtual)/ []  [facility]/ [] Home Care/ [] Refused Recommended Disposition /[] [place] [facility]/ [x]  Follow-up with PCP Additional Notes: Please advise pt.  Answer Assessment - Initial Assessment Questions 1. DIARRHEA SEVERITY: "How bad is the diarrhea?" "How many more stools have you had in the past 24 hours than normal?"    - NO DIARRHEA (SCALE 0)   - MILD (SCALE 1-3): Few loose or mushy BMs; increase of 1-3 stools over normal daily number of stools; mild increase in ostomy output.   -  MODERATE (SCALE 4-7): Increase of 4-6 stools daily over normal; moderate increase in ostomy output.   -  SEVERE (SCALE 8-10; OR "WORST POSSIBLE"): Increase of 7 or more stools daily over normal; moderate increase in ostomy output; incontinence.     Severe 2. ONSET: "When did the diarrhea begin?"      Saturday 3. BM CONSISTENCY: "How loose or watery is the diarrhea?"      Loose 4. VOMITING: "Are you also vomiting?" If Yes, ask: "How many times in the past 24 hours?"      No 5. ABDOMEN PAIN: "Are you having any abdomen pain?" If Yes, ask: "What does it feel like?" (e.g., crampy, dull, intermittent, constant)      Cramping 6. ABDOMEN PAIN SEVERITY: If present, ask: "How bad is the pain?"  (e.g., Scale 1-10; mild, moderate, or severe)   - MILD (1-3): doesn't interfere with normal activities, abdomen soft and not tender to touch    - MODERATE (4-7): interferes with normal activities or awakens from sleep, abdomen tender to touch    - SEVERE (8-10): excruciating pain, doubled over, unable to do any normal activities       Mild 7. ORAL INTAKE: If vomiting, "Have you been able to drink liquids?" "How much liquids have you had in  the past 24 hours?"     Yes 8. HYDRATION: "Any signs of dehydration?" (e.g., dry mouth [not just dry lips], too weak to stand, dizziness, new weight loss) "When did you last urinate?"     No 9. EXPOSURE: "Have you traveled to a foreign country recently?" "Have you been exposed to anyone with diarrhea?" "Could you have eaten any food that was spoiled?"     No 10. ANTIBIOTIC USE: "Are you taking antibiotics now or have you taken antibiotics in the past 2 months?"       No 11. OTHER SYMPTOMS: "Do you have any other symptoms?" (e.g., fever, blood in stool)       No 12. PREGNANCY: "Is there any chance you are pregnant?" "When was your last menstrual period?"       No  Protocols used: [facility]

## 2021-11-28 ENCOUNTER — Other Ambulatory Visit: Payer: Self-pay

## 2021-11-28 ENCOUNTER — Emergency Department (HOSPITAL_COMMUNITY): Payer: Medicaid Other

## 2021-11-28 ENCOUNTER — Emergency Department (HOSPITAL_COMMUNITY)
Admission: EM | Admit: 2021-11-28 | Discharge: 2021-11-28 | Disposition: A | Discharge status: Home / Self Care | Payer: Medicaid Other | Attending: Emergency Medicine | Admitting: Emergency Medicine

## 2021-11-28 ENCOUNTER — Encounter (HOSPITAL_COMMUNITY): Payer: Self-pay | Admitting: Emergency Medicine

## 2021-11-28 DIAGNOSIS — D72829 Elevated white blood cell count, unspecified: Secondary | ICD-10-CM | POA: Diagnosis not present

## 2021-11-28 DIAGNOSIS — R9431 Abnormal electrocardiogram [ECG] [EKG]: Secondary | ICD-10-CM | POA: Diagnosis not present

## 2021-11-28 DIAGNOSIS — R7401 Elevation of levels of liver transaminase levels: Secondary | ICD-10-CM | POA: Insufficient documentation

## 2021-11-28 DIAGNOSIS — I509 Heart failure, unspecified: Secondary | ICD-10-CM | POA: Insufficient documentation

## 2021-11-28 DIAGNOSIS — R0600 Dyspnea, unspecified: Secondary | ICD-10-CM | POA: Diagnosis not present

## 2021-11-28 DIAGNOSIS — R197 Diarrhea, unspecified: Secondary | ICD-10-CM | POA: Insufficient documentation

## 2021-11-28 DIAGNOSIS — Z7901 Long term (current) use of anticoagulants: Secondary | ICD-10-CM | POA: Insufficient documentation

## 2021-11-28 DIAGNOSIS — N3000 Acute cystitis without hematuria: Secondary | ICD-10-CM | POA: Diagnosis not present

## 2021-11-28 DIAGNOSIS — Z79899 Other long term (current) drug therapy: Secondary | ICD-10-CM | POA: Insufficient documentation

## 2021-11-28 DIAGNOSIS — R739 Hyperglycemia, unspecified: Secondary | ICD-10-CM | POA: Diagnosis not present

## 2021-11-28 DIAGNOSIS — R350 Frequency of micturition: Secondary | ICD-10-CM | POA: Diagnosis present

## 2021-11-28 LAB — CBC WITH DIFFERENTIAL/PLATELET
Abs Immature Granulocytes: 0.03 10*3/uL (ref 0.00–0.07)
Basophils Absolute: 0.1 10*3/uL (ref 0.0–0.1)
Basophils Relative: 1 %
Eosinophils Absolute: 0.2 10*3/uL (ref 0.0–0.5)
Eosinophils Relative: 1 %
HCT: 45.4 % (ref 36.0–46.0)
Hemoglobin: 15.3 g/dL — ABNORMAL HIGH (ref 12.0–15.0)
Immature Granulocytes: 0 %
Lymphocytes Relative: 19 %
Lymphs Abs: 2.6 10*3/uL (ref 0.7–4.0)
MCH: 28.9 pg (ref 26.0–34.0)
MCHC: 33.7 g/dL (ref 30.0–36.0)
MCV: 85.8 fL (ref 80.0–100.0)
Monocytes Absolute: 0.8 10*3/uL (ref 0.1–1.0)
Monocytes Relative: 6 %
Neutro Abs: 9.6 10*3/uL — ABNORMAL HIGH (ref 1.7–7.7)
Neutrophils Relative %: 73 %
Platelets: 380 10*3/uL (ref 150–400)
RBC: 5.29 MIL/uL — ABNORMAL HIGH (ref 3.87–5.11)
RDW: 13.1 % (ref 11.5–15.5)
WBC: 13.3 10*3/uL — ABNORMAL HIGH (ref 4.0–10.5)
nRBC: 0 % (ref 0.0–0.2)

## 2021-11-28 LAB — URINALYSIS, ROUTINE W REFLEX MICROSCOPIC
Bilirubin Urine: NEGATIVE
Glucose, UA: >=500 mg/dL — AB
Ketones, ur: NEGATIVE mg/dL
Nitrite: NEGATIVE
Protein, ur: NEGATIVE mg/dL
Specific Gravity, Urine: 1.007 (ref 1.005–1.030)
pH: 6 (ref 5.0–8.0)

## 2021-11-28 LAB — COMPREHENSIVE METABOLIC PANEL WITH GFR
ALT: 56 U/L — ABNORMAL HIGH (ref 0–44)
AST: 33 U/L (ref 15–41)
Albumin: 4 g/dL (ref 3.5–5.0)
Alkaline Phosphatase: 127 U/L — ABNORMAL HIGH (ref 38–126)
Anion gap: 11 (ref 5–15)
BUN: 8 mg/dL (ref 6–20)
CO2: 27 mmol/L (ref 22–32)
Calcium: 9.5 mg/dL (ref 8.9–10.3)
Chloride: 101 mmol/L (ref 98–111)
Creatinine, Ser: 0.84 mg/dL (ref 0.44–1.00)
GFR, Estimated: >60 mL/min
Glucose, Bld: 111 mg/dL — ABNORMAL HIGH (ref 70–99)
Potassium: 3.5 mmol/L (ref 3.5–5.1)
Sodium: 139 mmol/L (ref 135–145)
Total Bilirubin: 0.6 mg/dL (ref 0.3–1.2)
Total Protein: 7.4 g/dL (ref 6.5–8.1)

## 2021-11-28 LAB — BRAIN NATRIURETIC PEPTIDE: B Natriuretic Peptide: 81.8 pg/mL (ref 0.0–100.0)

## 2021-11-28 LAB — LIPASE, BLOOD: Lipase: 33 U/L (ref 11–51)

## 2021-11-28 LAB — LACTIC ACID, PLASMA: Lactic Acid, Venous: 1.2 mmol/L (ref 0.5–1.9)

## 2021-11-28 MED ORDER — ALPRAZOLAM 0.25 MG PO TABS
2.0000 mg | ORAL_TABLET | Freq: Once | ORAL | Status: AC
  Administered 2021-11-28: 2 mg via ORAL
  Filled 2021-11-28: qty 8

## 2021-11-28 MED ORDER — CEFADROXIL 500 MG PO CAPS
500.0000 mg | ORAL_CAPSULE | Freq: Two times a day (BID) | ORAL | 0 refills | Status: DC
  Filled 2021-11-28: qty 14, 7d supply, fill #0

## 2021-11-28 MED ORDER — CEPHALEXIN 250 MG PO CAPS
1000.0000 mg | ORAL_CAPSULE | Freq: Once | ORAL | Status: AC
  Administered 2021-11-28: 1000 mg via ORAL
  Filled 2021-11-28: qty 4

## 2021-11-28 NOTE — ED Notes (Signed)
Provider at bedside at this time

## 2021-11-28 NOTE — Discharge Instructions (Addendum)
Work-up today did suggest a new urinary tract infection.  Please take the entire course of antibiotics that I have prescribed.  Please follow-up with your gastroenterologist as you have planned.  I am reaching out to a GI doctor to see if they can see you sooner, if so they will give you a call, if not please keep your appointment on the 29th.  In the meantime I would cautiously continue taking your normal medications to prevent worsening of your heart failure, you do not show any signs of heart failure exacerbation today.  Please return to the emergency department if you are having return of your diarrhea symptoms, abdominal pain, worsening shortness of breath, chest pain, or other signs of fluid overload.

## 2021-11-28 NOTE — ED Triage Notes (Signed)
Pt with significant cardiac history who has had ongoing bouts between uncontrollable explosive diarrhea and constipation. Pt has seen her pcp for this and has been unable to get an appt with GI until the end of august.  Pt states she also feels all of the diarrhea has caused a UTI and has frequency, burning, and pain in her right flank.  Pt states she was told that with this diarrhea she should hold her lasix but now she is short of breath as well.  Pt knowledgeable about her health hx and current symptoms and has attempted outpatient management appropriately but feels she needs urgent eval for her urinary symptoms and suspected dehydration.

## 2021-11-28 NOTE — ED Provider Notes (Signed)
[facility] EMERGENCY DEPARTMENT Provider Note   CSN: [id number] Arrival date & time: 11/28/21  1504     History  Chief Complaint  Patient presents with   Diarrhea   Urinary Frequency    Christine [name] is a 56 y.o. female unfortunate recent past medical history significant for ACS, congestive heart failure with low ejection fraction, history of anxiety, tobacco use who has been struggling with high output diarrhea for the last few months.  Patient reports that she has been alternating between 7 to 10 days of explosive diarrhea, reporting that she feels multiple adult size diapers, to the point of having incontinence over the course of 24 to 36 hours, recently associated left lower quadrant pain only during episodes of diarrhea.  Patient reports that after most recent course of severe diarrhea which ended around Monday morning she is concerned due to urinary urgency, dysuria, and right sided flank pain intermittently that she may have developed a UTI or kidney infection.  Patient reports that she does not have any vaginal discharge, vaginal bleeding.  She denies significant amount of blood in stool, history of recent antibiotic use.  She is told to discontinue her Wilder Glade while she is having high output diarrhea, and discontinue her Lasix to make sure that she does not become acutely dehydrated.  She reports that since stopping her Lasix during this last bout she has been feeling short of breath.  She denies any chest pain.  Patient was sent to the emergency department due to her ongoing symptoms, she has an appointment with GI but is not able to see them until the 29th of this month.  She normally takes Imodium during her diarrhea episodes but wants to make sure that she is not taking too much Imodium, additionally she reports that she has been having persistent nausea for the last several weeks with every meal, but can only take Zofran twice daily due to risk for QT prolongation.   At time of my evaluation patient denies any pain, fever, chills, and reports no high output diarrhea today.  Patient reports that she was advised to come the emergency department by her primary care doctor due to her symptoms.   Diarrhea Urinary Frequency       Home Medications Prior to Admission medications   Medication Sig Start Date End Date Taking? Authorizing Provider  acetaminophen (TYLENOL) 500 MG tablet Take 1,000 mg by mouth every 6 (six) hours as needed for moderate pain.   Yes [provider]  ALPRAZolam Duanne Moron) 1 MG tablet Take 2 mg by mouth 3 (three) times daily.   Yes [provider]  apixaban (ELIQUIS) 5 MG TABS tablet Take 1 tablet (5 mg total) by mouth 2 (two) times daily. 11/10/21  Yes Bensimhon, Shaune Pascal, MD  atorvastatin (LIPITOR) 80 MG tablet Take 1 tablet (80 mg total) by mouth daily. 09/26/21  Yes Larey Dresser, MD  cefadroxil (DURICEF) 500 MG capsule Take 1 capsule (500 mg total) by mouth 2 (two) times daily. 11/28/21  Yes Delia Sitar H, PA-C  cyclobenzaprine (FLEXERIL) 10 MG tablet Take 20 mg by mouth at bedtime.   Yes [provider]  dapagliflozin propanediol (FARXIGA) 10 MG TABS tablet Take 1 tablet (10 mg total) by mouth daily. 10/20/21  Yes Bensimhon, Shaune Pascal, MD  desvenlafaxine (PRISTIQ) 25 MG 24 hr tablet Take 25 mg by mouth every morning. 11/09/21  Yes [provider]  digoxin (LANOXIN) 0.125 MG tablet Take 1 tablet (0.125 mg  total) by mouth daily. 10/19/21  Yes Larey Dresser, MD  diphenhydrAMINE (BENADRYL) 25 MG tablet Take 25 mg by mouth every 6 (six) hours as needed for allergies.   Yes [provider]  furosemide (LASIX) 20 MG tablet Take 4 tablets (80 mg total) by mouth in the morning AND 3 tablets (60 mg total) every evening. 11/06/21  Yes Milford, Maricela Bo, FNP  ivabradine (CORLANOR) 5 MG TABS tablet Take 1 tablet (5 mg total) by mouth 2 (two) times daily with a meal. 05/11/21  Yes Bensimhon, Shaune Pascal,  MD  loperamide (IMODIUM A-D) 2 MG tablet Take 2-4 mg by mouth 4 (four) times daily as needed for diarrhea or loose stools.   Yes [provider]  losartan (COZAAR) 25 MG tablet Take 0.5 tablets (12.5 mg total) by mouth daily. 11/06/21  Yes Ladell Pier, MD  metoprolol succinate (TOPROL-XL) 25 MG 24 hr tablet Take 1 tablet (25 mg total) by mouth daily. Take with or immediately following a meal. 06/26/21  Yes Bensimhon, Shaune Pascal, MD  metroNIDAZOLE (METROGEL) 1 % gel Apply 1 application topically daily. Patient taking differently: Apply 1 application  topically daily as needed (rosaecea). 05/09/21  Yes Ladell Pier, MD  ondansetron (ZOFRAN) 4 MG tablet Take 1 tablet (4 mg total) by mouth daily as needed for nausea or vomiting. 11/15/21  Yes Ladell Pier, MD  oxymetazoline (AFRIN) 0.05 % nasal spray Place 2 sprays into both nostrils 2 (two) times daily as needed for congestion.   Yes [provider]  potassium chloride SA (KLOR-CON M) 20 MEQ tablet Take 40 mEq by mouth 2 (two) times daily.   Yes [provider]  spironolactone (ALDACTONE) 25 MG tablet Take 1 tablet (25 mg total) by mouth daily. 10/19/21  Yes Larey Dresser, MD  zolpidem (AMBIEN) 10 MG tablet Take 5-10 mg by mouth at bedtime as needed for sleep.   Yes [provider]      Allergies    Sulfa antibiotics, Erythromycin, Tramadol, and Tape    Review of Systems   Review of Systems  Gastrointestinal:  Positive for diarrhea.  Genitourinary:  Positive for frequency.  All other systems reviewed and are negative.   Physical Exam Updated Vital Signs BP (!) 107/31   Pulse 71   Temp 98 F (36.7 C)   Resp (!) 21   SpO2 98%  Physical Exam Vitals and nursing note reviewed.  Constitutional:      General: She is not in acute distress.    Appearance: Normal appearance.  HENT:     Head: Normocephalic and atraumatic.  Eyes:     General:        Right eye: No discharge.        Left eye:  No discharge.  Cardiovascular:     Rate and Rhythm: Normal rate and regular rhythm.     Heart sounds: No murmur heard.    No friction rub. No gallop.  Pulmonary:     Effort: Pulmonary effort is normal.     Breath sounds: Normal breath sounds.     Comments: No significant respiratory distress, mild tachypnea.  No accessory breath sounds noted, no focal consolidation.  No wheezing, rhonchi, stridor. Abdominal:     General: Bowel sounds are normal.     Palpations: Abdomen is soft.     Comments: No significant tenderness to palpation of the abdomen  Skin:    General: Skin is warm and dry.  Capillary Refill: Capillary refill takes less than 2 seconds.  Neurological:     Mental Status: She is alert and oriented to person, place, and time.  Psychiatric:        Mood and Affect: Mood normal.        Behavior: Behavior normal.     ED Results / Procedures / Treatments   Labs (all labs ordered are listed, but only abnormal results are displayed) Labs Reviewed  CBC WITH DIFFERENTIAL/PLATELET - Abnormal; Notable for the following components:      Result Value   WBC 13.3 (*)    RBC 5.29 (*)    Hemoglobin 15.3 (*)    Neutro Abs 9.6 (*)    All other components within normal limits  COMPREHENSIVE METABOLIC PANEL - Abnormal; Notable for the following components:   Glucose, Bld 111 (*)    ALT 56 (*)    Alkaline Phosphatase 127 (*)    All other components within normal limits  URINALYSIS, ROUTINE W REFLEX MICROSCOPIC - Abnormal; Notable for the following components:   Color, Urine STRAW (*)    Glucose, UA >=500 (*)    Hgb urine dipstick SMALL (*)    Leukocytes,Ua MODERATE (*)    Bacteria, UA RARE (*)    All other components within normal limits  URINE CULTURE  STOOL CULTURE  LIPASE, BLOOD  LACTIC ACID, PLASMA  BRAIN NATRIURETIC PEPTIDE    EKG EKG Interpretation  Date/Time:  Tuesday November 28 2021 16:59:22 EDT Ventricular Rate:  83 PR Interval:  156 QRS Duration: 104 QT  Interval:  384 QTC Calculation: 451 R Axis:   -3 Text Interpretation: Normal sinus rhythm Inferior infarct , age undetermined Anterolateral infarct , age undetermined Abnormal ECG No significant change since last tracing Confirmed by Deno Etienne [phone]) on 11/28/2021 5:15:37 PM  Radiology DG Chest 2 View  Result Date: 11/28/2021 CLINICAL DATA:  Dyspnea EXAM: CHEST - 2 VIEW COMPARISON:  08/18/2021 FINDINGS: Lungs are well expanded, symmetric, and clear. No pneumothorax or pleural effusion. Cardiac size within normal limits. Left subclavian single lead AICD unchanged. Pulmonary vascularity is normal. Osseous structures are age-appropriate. No acute bone abnormality. IMPRESSION: No active cardiopulmonary disease. Electronically Signed   By: Fidela Salisbury M.D.   On: 11/28/2021 18:58    Procedures Procedures    Medications Ordered in ED Medications  ALPRAZolam Duanne Moron) tablet 2 mg (2 mg Oral Given 11/28/21 2126)  cephALEXin (KEFLEX) capsule 1,000 mg (1,000 mg Oral Given 11/28/21 2127)    ED Course/ Medical Decision Making/ A&P                           Medical Decision Making  This patient is a 56 y.o. female who presents to the ED for concern of high output diarrhea, dysuria, this involves an extensive number of treatment options, and is a complaint that carries with it a high risk of complications and morbidity. The emergent differential diagnosis prior to evaluation includes, but is not limited to, urinary tract infection, we will investigate causes of patient's diarrhea, considered medication side effect, carcinoid although based on patient's clinical presentation, lack of significant redness, diaphoresis, as well as intermittent nature of diarrhea I think that this is less likely, consider infectious diarrhea,.   This is not an exhaustive differential.   Past Medical History / Co-morbidities / Social History: ACS, congestive heart failure with low ejection fraction, history of anxiety,  tobacco use  Additional history: Chart reviewed. Pertinent  results include: Reviewed lab work, imaging from recent PCP visits discussing this issue  Physical Exam: Physical exam performed. The pertinent findings include: She is well-appearing today, stable vital signs, she had a blood pressure reading just before discharge that I think is not legitimate, with blood pressure of 107/31.  She was slightly on the hypotensive side over her evaluation, but with diastolic minimum around 64.  Otherwise she is well-appearing, although quite anxious about her ongoing bouts of intermittent diarrhea.  Lab Tests: I ordered, and personally interpreted labs.  The pertinent results include: CMP overall reassuring today, she has mild hyperglycemia with glucose of 111.  ALT minimally elevated at 56, alk phos is elevated at 127 today.  Additionally patient with a mild leukocytosis, white blood cells 13.3, hemoglobin 15.3, given her recent bout of diarrhea I think that she is likely slightly hemoconcentrated, although does not significantly   Imaging Studies: I ordered imaging studies including film chest x-ray. I independently visualized and interpreted imaging which showed no evidence of significant fluid overload, or other intrathoracic abnormality. I agree with the radiologist interpretation.   Cardiac Monitoring:  The patient was maintained on a cardiac monitor.  My attending physician Dr. Tyrone Nine viewed and interpreted the cardiac monitored which showed an underlying rhythm of: Normal sinus rhythm. I agree with this interpretation.   Medications: I ordered medication including Xanax to administer patient's home anxiety medication, as well as Keflex to treat for UTI. Reevaluation of the patient after these medicines showed that the patient improved. I have reviewed the patients home medicines and have made adjustments as needed.  We will discharge with cefadroxil for UTI  Consultations Obtained: I requested  consultation with the gastroenterologist to try to schedule patient a slightly sooner appointment,  and discussed lab and imaging findings as well as pertinent plan - they recommend she keep her appointment at the end of this month.   Disposition: After consideration of the diagnostic results and the patients response to treatment, I feel that patient does have.   emergency department workup does not suggest an emergent condition requiring admission or immediate intervention beyond what has been performed at this time. The plan is: Follow-up with GI in 2 weeks as scheduled, continue following up with PCP, cardiologist, follow-up with PCP to ensure UTI symptoms are resolved. The patient is safe for discharge and has been instructed to return immediately for worsening symptoms, change in symptoms or any other concerns.  I discussed this case with my attending physician Dr. Tyrone Nine who cosigned this note including patient's presenting symptoms, physical exam, and planned diagnostics and interventions. Attending physician stated agreement with plan or made changes to plan which were implemented.    Final Clinical Impression(s) / ED Diagnoses Final diagnoses:  Diarrhea, unspecified type  Acute cystitis without hematuria    Rx / DC Orders ED Discharge Orders          Ordered    cefadroxil (DURICEF) 500 MG capsule  2 times daily        11/28/21 2112              Arthurine Oleary, Jackalyn Lombard 11/28/21 [place], [place], DO 11/28/21 2248

## 2021-11-28 NOTE — ED Provider Triage Note (Signed)
Emergency Medicine Provider Triage Evaluation Note  Christine [name] , a 56 y.o. female  was evaluated in triage.  Pt complains of explosive diarrhea for 36 hours. Happens every 7-10 days since may. +weightless since May. 8-10 episodes diarrhea today, no blood. Here because can't see GI till 8/29 and PCP worried about diarrhea. LLQ abdominal pain with diarrhea, not without. +dysuria, +urinary frequency.   No travel, no antibiotics, no sick contacts.   +SOB, changes in lasix recently. No CP  Review of Systems  Per PCP  Physical Exam  BP 119/76 (BP Location: Right Arm)   Pulse 86   Temp 98.7 F (37.1 C) (Oral)   Resp 16   SpO2 100%  Gen:   Awake, no distress   Resp:  Normal effort  MSK:   Moves extremities without difficulty  Other:  Pale. +LLQ  Medical Decision Making  Medically screening exam initiated at 4:35 PM.  Appropriate orders placed.  Quinnlyn Hearns was informed that the remainder of the evaluation will be completed by another provider, this initial triage assessment does not replace that evaluation, and the importance of remaining in the ED until their evaluation is complete.     Sherrill Raring, PA-C 11/28/21 1640

## 2021-11-28 NOTE — ED Notes (Signed)
Phlebotomist reports that she will be by to get the patients lactic when available when asked by this RN to come by

## 2021-11-28 NOTE — Telephone Encounter (Addendum)
Called pt back.  Referred pt to EMS/ED for care. Pt does not want to go as she has been there so much recently. PT is upset feeling that her concerns are not being addressed.    Teams Message with clinic Dr. Wynetta Emery is not in today.  FC [facility]. Pt does not have transportation.  Fc states that pt was to follow up with GI for a sooner appt.     Pt called again, today after speaking with [facility] yesterday.  Shared Note from Doloris Hall:  "Please call patient and inform to return the stool sample that was ordered and her last visit with Dr.Johnson, and to reach out to her GI doctor to see if she can get an earlier appointment."  Pt will not be able to give stool sample as her diarrhea stopped yesterday. She states she will not have another BM until this starts again. Pt states that the diarrhea is completely uncontrolled for 36 hours. She wears a brief during these episodes.  She states she is weak and losing weight. Pt has a GI appt for August 29 with Dr. Thornton Park, but is requesting assistance in getting a sooner appt. With GI.  Pt states she thinks she has a UTI. She is unable to control urination, and urination is painful.   PT does not have a car to bring a urine sample to the office. And does not think she could even if she did d/t uncontrolled urination.  Pt states that she is seeking help, and feels she is not getting the response she needs.  Pt is requesting a return call by Provider.

## 2021-11-28 NOTE — ED Notes (Signed)
XR called at this time to ensure that the patient gets over to her 2 view XR.

## 2021-11-28 NOTE — Telephone Encounter (Signed)
I was not aware of this call during my clinic but the time I looked this up and reviewed the message I see the patient now is in the ER as was recommended by the [facility] earlier this would have been the same outcome I would have had as she has already been seen in the clinic once for the diarrhea by Dr. Wynetta Emery on the 10th and is unimproved she has too many comorbidities she likely will need to be admitted

## 2021-11-29 ENCOUNTER — Other Ambulatory Visit (HOSPITAL_COMMUNITY): Payer: Self-pay

## 2021-11-29 DIAGNOSIS — R197 Diarrhea, unspecified: Secondary | ICD-10-CM | POA: Diagnosis not present

## 2021-11-29 NOTE — Telephone Encounter (Signed)
Noted.-----DD,RMA

## 2021-11-30 LAB — URINE CULTURE: Culture: 80000 — AB

## 2021-12-01 ENCOUNTER — Other Ambulatory Visit (HOSPITAL_COMMUNITY): Payer: Self-pay

## 2021-12-01 ENCOUNTER — Telehealth: Payer: Self-pay | Admitting: *Deleted

## 2021-12-01 NOTE — Telephone Encounter (Signed)
Post ED Visit - Positive Culture Follow-up  Culture report reviewed by antimicrobial stewardship pharmacist: [place] Team []  Elenor Quinones, Pharm.D. []  Heide Guile, Pharm.D., BCPS AQ-ID []  Parks Neptune, Pharm.D., BCPS []  Alycia Rossetti, Pharm.D., BCPS []  [place], Pharm.D., BCPS, AAHIVP []  Legrand Como, Pharm.D., BCPS, AAHIVP []  Salome Arnt, PharmD, BCPS []  Johnnette Gourd, PharmD, BCPS []  Hughes Better, PharmD, BCPS []  Leeroy Cha, PharmD []  Laqueta Linden, PharmD, BCPS []  Albertina Parr, PharmD  [place] Team []  Leodis Sias, PharmD []  Lindell Spar, PharmD []  Royetta Asal, PharmD []  Graylin Shiver, Rph []  Rema Fendt) Glennon Mac, PharmD []  Arlyn Dunning, PharmD []  Netta Cedars, PharmD []  Dia Sitter, PharmD []  Leone Haven, PharmD []  Gretta Arab, PharmD []  Theodis Shove, PharmD []  Peggyann Juba, PharmD []  Reuel Boom, PharmD   Positive urine culture Treated with Cefadroxil, organism sensitive to the same and no further patient follow-up is required at this time. Shara Blazing, PharmD  Harlon Flor Talley 12/01/2021, 2:27 PM

## 2021-12-04 ENCOUNTER — Other Ambulatory Visit (HOSPITAL_COMMUNITY): Payer: Self-pay | Admitting: Internal Medicine

## 2021-12-05 ENCOUNTER — Other Ambulatory Visit (HOSPITAL_COMMUNITY): Payer: Self-pay

## 2021-12-05 MED ORDER — IVABRADINE HCL 5 MG PO TABS
5.0000 mg | ORAL_TABLET | Freq: Two times a day (BID) | ORAL | 6 refills | Status: DC
  Filled 2021-12-05: qty 60, 30d supply, fill #0
  Filled 2022-01-01: qty 60, 30d supply, fill #1

## 2021-12-06 ENCOUNTER — Telehealth: Payer: Self-pay | Admitting: Cardiology

## 2021-12-06 NOTE — Telephone Encounter (Signed)
Spoke with the patient who reports that she has had uncontrollable diarrhea that occurs every 7-12 days since May. Her primary care has referred her to GI and she is scheduled to see them next week. She reports that she was doing some research about her CPAP machine because she has been using it and sleeping 10-12 hours per night however she still feels tired when she wakes up. She states that it also causes her to feel bloated and gassy. She saw that it could cause issues such as IBS and thought it could be a cause of her symptoms. She states that prior to using the machine she was sleeping a lot less but felt more rested. She reports that last night she did not use her machine just to see how she felt. She states that she got 7 hours of sleep and feels rested. She is going to contact us after her follow up with GI.

## 2021-12-06 NOTE — Telephone Encounter (Signed)
Patient called stating she has been having bouts of explosive diarrhea every two weeks since May.  Patient is concerned this may be a side effect from the CPAP machine.  Patient stated she also feels sleepier when using the CPAP.

## 2021-12-07 LAB — CDIFF NAA+O+P+STOOL CULTURE
E coli, Shiga toxin Assay: NEGATIVE
Toxigenic C. Difficile by PCR: NEGATIVE

## 2021-12-08 ENCOUNTER — Telehealth: Payer: Self-pay | Admitting: *Deleted

## 2021-12-08 ENCOUNTER — Other Ambulatory Visit: Payer: Self-pay | Admitting: Internal Medicine

## 2021-12-08 ENCOUNTER — Telehealth (HOSPITAL_COMMUNITY): Payer: Self-pay | Admitting: *Deleted

## 2021-12-08 DIAGNOSIS — G4733 Obstructive sleep apnea (adult) (pediatric): Secondary | ICD-10-CM

## 2021-12-08 DIAGNOSIS — I2510 Atherosclerotic heart disease of native coronary artery without angina pectoris: Secondary | ICD-10-CM

## 2021-12-08 DIAGNOSIS — I509 Heart failure, unspecified: Secondary | ICD-10-CM

## 2021-12-08 NOTE — Telephone Encounter (Signed)
Per Dr Radford Pax, Turn auto CPAP down to 4-12cm H2O and get a download in 4 weeks

## 2021-12-08 NOTE — Telephone Encounter (Signed)
Order placed to [facility].

## 2021-12-08 NOTE — Telephone Encounter (Addendum)
Per Dr Radford Pax, Turn auto CPAP down to 4-12cm H2O and get a download in 4 weeks   Order placed to [facility].

## 2021-12-08 NOTE — Telephone Encounter (Signed)
Error

## 2021-12-09 MED ORDER — POTASSIUM CHLORIDE CRYS ER 20 MEQ PO TBCR
40.0000 meq | EXTENDED_RELEASE_TABLET | Freq: Two times a day (BID) | ORAL | 1 refills | Status: DC
  Filled 2021-12-09: qty 120, 30d supply, fill #0
  Filled 2022-01-05: qty 120, 30d supply, fill #1

## 2021-12-11 ENCOUNTER — Other Ambulatory Visit (HOSPITAL_COMMUNITY): Payer: Self-pay

## 2021-12-11 MED ORDER — ONDANSETRON HCL 4 MG PO TABS
4.0000 mg | ORAL_TABLET | Freq: Every day | ORAL | 0 refills | Status: DC | PRN
  Filled 2021-12-11: qty 20, 20d supply, fill #0

## 2021-12-11 NOTE — Telephone Encounter (Signed)
Requested medication (s) are due for refill today:   Provider to review  Requested medication (s) are on the active medication list:   Yes  Future visit scheduled:   Yes   Last ordered: 11/15/2021 #20, 0 refills  Non delegated refill reason returned   Requested Prescriptions  Pending Prescriptions Disp Refills   ondansetron (ZOFRAN) 4 MG tablet 20 tablet 0    Sig: Take 1 tablet (4 mg total) by mouth daily as needed for nausea or vomiting.     Not Delegated - Gastroenterology: Antiemetics - ondansetron Failed - 12/08/2021  9:29 PM      Failed - This refill cannot be delegated      Failed - ALT in normal range and within 360 days    ALT  Date Value Ref Range Status  11/28/2021 56 (H) 0 - 44 U/L Final  09/20/2021 29 0 - 44 U/L Final         Passed - AST in normal range and within 360 days    AST  Date Value Ref Range Status  11/28/2021 33 15 - 41 U/L Final  09/20/2021 21 15 - 41 U/L Final         Passed - Valid encounter within last 6 months    Recent Outpatient Visits           2 weeks ago Diarrhea, unspecified type   [place], MD   3 months ago [place] Karle Plumber B, MD   4 months ago Dizziness and giddiness   [facility], Cari S, PA-C   7 months ago 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

## 2021-12-12 ENCOUNTER — Other Ambulatory Visit (HOSPITAL_COMMUNITY): Payer: Self-pay

## 2021-12-12 ENCOUNTER — Ambulatory Visit: Payer: Medicaid Other | Admitting: Gastroenterology

## 2021-12-14 ENCOUNTER — Ambulatory Visit (INDEPENDENT_AMBULATORY_CARE_PROVIDER_SITE_OTHER): Payer: Medicaid Other | Admitting: Physician Assistant

## 2021-12-14 ENCOUNTER — Encounter: Payer: Self-pay | Admitting: Physician Assistant

## 2021-12-14 ENCOUNTER — Other Ambulatory Visit (HOSPITAL_COMMUNITY): Payer: Self-pay

## 2021-12-14 ENCOUNTER — Telehealth: Payer: Self-pay | Admitting: *Deleted

## 2021-12-14 VITALS — BP 92/60 | HR 83 | Ht 70.0 in | Wt 184.2 lb

## 2021-12-14 DIAGNOSIS — R110 Nausea: Secondary | ICD-10-CM

## 2021-12-14 DIAGNOSIS — R197 Diarrhea, unspecified: Secondary | ICD-10-CM

## 2021-12-14 DIAGNOSIS — R634 Abnormal weight loss: Secondary | ICD-10-CM | POA: Diagnosis not present

## 2021-12-14 MED ORDER — DIPHENOXYLATE-ATROPINE 2.5-0.025 MG PO TABS: 1.0000 | ORAL_TABLET | Freq: Four times a day (QID) | ORAL | 1 refills | Status: DC | PRN

## 2021-12-14 MED ORDER — PROMETHAZINE HCL 25 MG PO TABS
25.0000 mg | ORAL_TABLET | Freq: Three times a day (TID) | ORAL | 1 refills | Status: DC | PRN
  Filled 2021-12-14: qty 20, 7d supply, fill #0
  Filled 2021-12-20: qty 20, 7d supply, fill #1

## 2021-12-14 MED ORDER — DIPHENOXYLATE-ATROPINE 2.5-0.025 MG PO TABS
1.0000 | ORAL_TABLET | Freq: Four times a day (QID) | ORAL | 1 refills | Status: DC | PRN
  Filled 2021-12-14: qty 30, 8d supply, fill #0
  Filled 2022-01-10: qty 30, 8d supply, fill #1

## 2021-12-14 NOTE — Progress Notes (Signed)
Chief Complaint: Diarrhea  HPI:    Christine [name] is a 56 y/o female with a past medical history as listed below including CAD on Eliquis and chronic combined systolic and diastolic CHF (0/53/9767 echo with LVEF less than 20%) status post ICD, previously known to Dr. Sharlett Iles, who was referred to me by Ladell Pier, MD for a complaint of diarrhea.      11/07/2021 Cologuard negative.    11/28/2021 chest x-ray for dyspnea with no active cardiopulmonary disease.    11/28/2021 ER visit for diarrhea for the last few months.  Described alternating between 7 to 10 days of explosive diarrhea.  CBC with white count 13.3.  An ALT of 56 and alk phos of 127.  She was given Keflex to treat a UTI.    11/29/2021 C. difficile by PCR negative, O&P negative, E. coli negative, Campylobacter negative, Salmonella/Shigella negative.    Today, patient presents to clinic and tells me that in May she went to see her PCP and they increased her potassium and 5 days after that she had her first "episode" of urgent diarrhea and incontinence unfortunately at work, she had to leave and clean up and this lasted for about 36 hours, describes at least 8-10 loose urgent watery stools, she started taking over-the-counter Imodium and this stopped after 36 hours.  And then continued to have episodes about every 7 to 12 days which would always start with stomach rumbling and then she would have urgent diarrhea.  Along with this has developed nausea which typically occurs after she tries to eat something and will last for hours.  She is on Zofran 4 mg but can only use it twice a day due to her "heart issues", this does not really help much.  Has switched to adult diapers and during her last episode which was actually about a month ago now she went through 8 adult diapers in 1 day.  Describes she is having trouble eating and trying to stay very bland because she is not sure what sets it off.  She is only using Imodium which she is not sure  if it eventually kicks in 36 hours later is not helping at all.  Tells me she has not had a bowel movement at all in the past 30 days, but is passing gas.  Describes that when she sent in her stool for Cologuard testing as above it was soft solid and not diarrhea.  She has lost around 6 pounds over the past couple of months.    Currently out of work due to symptoms.    Denies fever, chills, blood in her stool or symptoms that awaken her from sleep.  Past Medical History:  Diagnosis Date   Anxiety 01/20/2021   CAD in native artery 08/24/2021   CHF (congestive heart failure) ([place])    Chronic combined systolic and diastolic heart failure ([place]) 08/24/2021   Shortness of breath 01/20/2021   Tobacco abuse 01/20/2021    Past Surgical History:  Procedure Laterality Date   ABDOMINAL HYSTERECTOMY     CARDIAC CATHETERIZATION     CHOLECYSTECTOMY     ICD IMPLANT N/A 07/21/2021   Procedure: ICD IMPLANT;  Surgeon: Deboraha Sprang, MD;  Location: [facility];  Service: Cardiovascular;  Laterality: N/A;   NECK SURGERY     OVARIAN CYST SURGERY     RIGHT/LEFT HEART CATH AND CORONARY ANGIOGRAPHY N/A 01/23/2021   Procedure: RIGHT/LEFT HEART CATH AND CORONARY ANGIOGRAPHY;  Surgeon: Glori Bickers  R, MD;  Location: [facility];  Service: Cardiovascular;  Laterality: N/A;    Current Outpatient Medications  Medication Sig Dispense Refill   acetaminophen (TYLENOL) 500 MG tablet Take 1,000 mg by mouth every 6 (six) hours as needed for moderate pain.     ALPRAZolam (XANAX) 1 MG tablet Take 2 mg by mouth 3 (three) times daily.     apixaban (ELIQUIS) 5 MG TABS tablet Take 1 tablet (5 mg total) by mouth 2 (two) times daily. 60 tablet 7   atorvastatin (LIPITOR) 80 MG tablet Take 1 tablet (80 mg total) by mouth daily. 30 tablet 3   cefadroxil (DURICEF) 500 MG capsule Take 1 capsule (500 mg total) by mouth 2 (two) times daily. 14 capsule 0   cyclobenzaprine (FLEXERIL) 10 MG tablet Take 20 mg by mouth at  bedtime.     dapagliflozin propanediol (FARXIGA) 10 MG TABS tablet Take 1 tablet (10 mg total) by mouth daily. 30 tablet 11   desvenlafaxine (PRISTIQ) 25 MG 24 hr tablet Take 25 mg by mouth every morning.     digoxin (LANOXIN) 0.125 MG tablet Take 1 tablet (0.125 mg total) by mouth daily. 30 tablet 3   diphenhydrAMINE (BENADRYL) 25 MG tablet Take 25 mg by mouth every 6 (six) hours as needed for allergies.     furosemide (LASIX) 20 MG tablet Take 4 tablets (80 mg total) by mouth in the morning AND 3 tablets (60 mg total) every evening. 210 tablet 3   ivabradine (CORLANOR) 5 MG TABS tablet Take 1 tablet (5 mg total) by mouth 2 (two) times daily with a meal. 60 tablet 6   loperamide (IMODIUM A-D) 2 MG tablet Take 2-4 mg by mouth 4 (four) times daily as needed for diarrhea or loose stools.     losartan (COZAAR) 25 MG tablet Take 0.5 tablets (12.5 mg total) by mouth daily. 30 tablet 3   metoprolol succinate (TOPROL-XL) 25 MG 24 hr tablet Take 1 tablet (25 mg total) by mouth daily. Take with or immediately following a meal. 30 tablet 11   metroNIDAZOLE (METROGEL) 1 % gel Apply 1 application topically daily. (Patient taking differently: Apply 1 application  topically daily as needed (rosaecea).) 60 g 0   ondansetron (ZOFRAN) 4 MG tablet Take 1 tablet (4 mg total) by mouth daily as needed for nausea or vomiting. 20 tablet 0   oxymetazoline (AFRIN) 0.05 % nasal spray Place 2 sprays into both nostrils 2 (two) times daily as needed for congestion.     potassium chloride SA (KLOR-CON M) 20 MEQ tablet Take 2 tablets (40 mEq total) by mouth 2 (two) times daily. 120 tablet 1   spironolactone (ALDACTONE) 25 MG tablet Take 1 tablet (25 mg total) by mouth daily. 30 tablet 3   zolpidem (AMBIEN) 10 MG tablet Take 5-10 mg by mouth at bedtime as needed for sleep.     No current facility-administered medications for this visit.    Allergies as of 12/14/2021 - Review Complete 11/28/2021  Allergen Reaction Noted    Sulfa antibiotics Hives and Itching 04/20/2019   Erythromycin Hives 01/12/2018   Tramadol Rash and Other (See Comments) 01/12/2018   Tape Rash 03/17/2021    Family History  Problem Relation Age of Onset   Stroke Mother    Atrial fibrillation Mother    Heart failure Father     Social History   Socioeconomic History   Marital status: Single    Spouse name: Not on file  Number of children: Not on file   Years of education: 16   Highest education level: Not on file  Occupational History   Not on file  Tobacco Use   Smoking status: Former    Packs/day: 0.50    Types: Cigarettes    Quit date: 01/15/2021    Years since quitting: 0.9   Smokeless tobacco: Never  Vaping Use   Vaping Use: Never used  Substance and Sexual Activity   Alcohol use: Yes    Comment: rarely   Drug use: Never   Sexual activity: Not on file  Other Topics Concern   Not on file  Social History Narrative   Not on file   Social Determinants of Health   Financial Resource Strain: Medium Risk (01/31/2021)   Overall Financial Resource Strain (CARDIA)    Difficulty of Paying Living Expenses: Somewhat hard  Food Insecurity: No Food Insecurity (01/31/2021)   Hunger Vital Sign    Worried About Running Out of Food in the Last Year: Never true    Ran Out of Food in the Last Year: Never true  Transportation Needs: No Transportation Needs (01/31/2021)   PRAPARE - Hydrologist (Medical): No    Lack of Transportation (Non-Medical): No  Physical Activity: Not on file  Stress: Not on file  Social Connections: Not on file  Intimate Partner Violence: Not on file    Review of Systems:    Constitutional: No fever or chills Skin: No rash Cardiovascular: No chest pain Respiratory: No SOB  Gastrointestinal: See HPI and otherwise negative Genitourinary: No dysuria  Neurological: No headache, dizziness or syncope Musculoskeletal: No new muscle or joint pain Hematologic: No bleeding  or bruising Psychiatric: No history of depression or anxiety   Physical Exam:  Vital signs: BP 92/60   Pulse 83   Ht 5' 10"  (1.778 m)   Wt 184 lb 3.2 oz (83.6 kg)   SpO2 96%   BMI 26.43 kg/m    Constitutional:   Pleasant Caucasian female appears to be in NAD, Well developed, Well nourished, alert and cooperative Head:  Normocephalic and atraumatic. Eyes:   PEERL, EOMI. No icterus. Conjunctiva pink. Ears:  Normal auditory acuity. Neck:  Supple Throat: Oral cavity and pharynx without inflammation, swelling or lesion.  Respiratory: Respirations even and unlabored. Lungs clear to auscultation bilaterally.   No wheezes, crackles, or rhonchi.  Cardiovascular: Normal S1, S2. No MRG. Regular rate and rhythm. No peripheral edema, cyanosis or pallor.  Gastrointestinal:  Soft, nondistended, nontender. No rebound or guarding. Increased BS all four quadrants. No appreciable masses or hepatomegaly. Rectal:  Not performed.  Msk:  Symmetrical without gross deformities. Without edema, no deformity or joint abnormality.  Neurologic:  Alert and  oriented x4;  grossly normal neurologically.  Skin:   Dry and intact without significant lesions or rashes. Psychiatric: Demonstrates good judgement and reason without abnormal affect or behaviors.  RELEVANT LABS AND IMAGING: CBC    Component Value Date/Time   WBC 13.3 (H) 11/28/2021 1657   RBC 5.29 (H) 11/28/2021 1657   HGB 15.3 (H) 11/28/2021 1657   HGB 15.3 (H) 09/20/2021 1504   HGB 17.5 (H) 08/10/2021 1023   HCT 45.4 11/28/2021 1657   HCT 50.7 (H) 08/10/2021 1023   PLT 380 11/28/2021 1657   PLT 330 09/20/2021 1504   PLT 374 08/10/2021 1023   MCV 85.8 11/28/2021 1657   MCV 86 08/10/2021 1023   MCH 28.9 11/28/2021 1657   MCHC  33.7 11/28/2021 1657   RDW 13.1 11/28/2021 1657   RDW 13.1 08/10/2021 1023   LYMPHSABS 2.6 11/28/2021 1657   LYMPHSABS 1.8 08/10/2021 1023   MONOABS 0.8 11/28/2021 1657   EOSABS 0.2 11/28/2021 1657   EOSABS 0.3  08/10/2021 1023   BASOSABS 0.1 11/28/2021 1657   BASOSABS 0.1 08/10/2021 1023    CMP     Component Value Date/Time   NA 139 11/28/2021 1657   NA 139 11/23/2021 1123   K 3.5 11/28/2021 1657   CL 101 11/28/2021 1657   CO2 27 11/28/2021 1657   GLUCOSE 111 (H) 11/28/2021 1657   BUN 8 11/28/2021 1657   BUN 15 11/23/2021 1123   CREATININE 0.84 11/28/2021 1657   CREATININE 0.87 09/20/2021 1504   CALCIUM 9.5 11/28/2021 1657   PROT 7.4 11/28/2021 1657   PROT 7.8 08/10/2021 1023   ALBUMIN 4.0 11/28/2021 1657   ALBUMIN 4.9 08/10/2021 1023   AST 33 11/28/2021 1657   AST 21 09/20/2021 1504   ALT 56 (H) 11/28/2021 1657   ALT 29 09/20/2021 1504   ALKPHOS 127 (H) 11/28/2021 1657   BILITOT 0.6 11/28/2021 1657   BILITOT 0.5 09/20/2021 1504   GFRNONAA >60 11/28/2021 1657   GFRNONAA >60 09/20/2021 1504   GFRAA  03/31/2008 1140    >60        The eGFR has been calculated using the MDRD equation. This calculation has not been validated in all clinical    Assessment: 1.  Diarrhea: Changed episodes of diarrhea in May with an increase in potassium by her PCP, patient has episodes every 7 to 12 days of 8-10 loose urgent stools/incontinence that last for 36 hours after the use of Imodium, last stool at the end of July per patient was soft solid, she has not had another episode since then and also not passed a stool for her; consider infectious versus inflammatory versus obstructive cause 2.  Weight loss: Around 6 pounds over the past month related to decrease in diet from below 3.  Nausea: constant, worse after eating, no real relief with Zofran, maintaining very bland diet, occasional episodes of vomiting maybe 1 time per week; consider relation to diarrhea and possibility of infectious cause above versus gastritis versus other  Plan: 1.  Ideally patient would have an EGD and colonoscopy for further evaluation.  These will need to be in the hospital given her significant decrease in ejection  fraction, less than 20%.  We will call her when these are scheduled.  This will be with Dr. Bryan Lemma.  Also discussed with patient that if she has another episode and ends up in the ER would recommend that they call our service and she be admitted for procedures as it may be easier and to have them done while she is in the hospital. 2.  Ordered CT of the abdomen pelvis for further evaluation of change in bowel habits, weight loss and nausea. 3.  Ordered further stool studies including a fecal calprotectin, lactoferrin, fecal pancreatic elastase and GI pathogen panel 4.  Prescribed Lomotil 1 tab every 8 hours as needed for diarrhea #30 with 1 refill. 5.  Prescribed Phenergan 25 mg 1 tab every 8 hours for nausea #20 with 1 refill. 6.  Patient to follow in clinic per recommendations after imaging and procedures above.  She was assigned to Dr. Bryan Lemma this morning.  Ellouise Newer, PA-C [facility] 12/14/2021, 9:21 AM  Cc: Ladell Pier, MD

## 2021-12-14 NOTE — Progress Notes (Signed)
Agree with the assessment and plan as outlined by Jennifer Lemmon, PA-C. ? ?Kaizen Ibsen, DO, FACG ? ?

## 2021-12-14 NOTE — Patient Instructions (Addendum)
We have sent the following medications to your pharmacy for you to pick up at your convenience: Phenergan 25 mg every 8 hours as needed for nausea.  Your provider has requested that you go to the basement level for lab work before leaving today. Press "B" on the elevator. The lab is located at the first door on the left as you exit the elevator.  You have been scheduled for a CT scan of the abdomen and pelvis at [facility], 1st floor Radiology. You are scheduled on Thursday 12/21/21 at 4 pm. You should arrive 15 minutes prior to your appointment time for registration.  We are giving you 2 bottles of contrast today that you will need to drink before arriving for the exam. The solution may taste better if refrigerated so put them in the refrigerator when you get home, but do NOT add ice or any other liquid to this solution as that would dilute it. Shake well before drinking.   Please follow the written instructions below on the day of your exam:   1) Do not eat anything after 12 pm (4 hours prior to your test)   2) Drink 1 bottle of contrast @ 2 pm (2 hours prior to your exam)  Remember to shake well before drinking and do NOT pour over ice.     Drink 1 bottle of contrast @ 3 pm (1 hour prior to your exam)   You may take any medications as prescribed with a small amount of water, if necessary. If you take any of the following medications: METFORMIN, GLUCOPHAGE, GLUCOVANCE, AVANDAMET, RIOMET, FORTAMET, [place] MET, JANUMET, GLUMETZA or METAGLIP, you MAY be asked to HOLD this medication 48 hours AFTER the exam.   The purpose of you drinking the oral contrast is to aid in the visualization of your intestinal tract. The contrast solution may cause some diarrhea. Depending on your individual set of symptoms, you may also receive an intravenous injection of x-ray contrast/dye. Plan on being at [facility] for 45 minutes or longer, depending on the type of exam you are having performed.   If you  have any questions regarding your exam or if you need to reschedule, you may call [facility] at [phone] between the hours of 8:00 am and 5:00 pm, Monday-Friday.

## 2021-12-14 NOTE — Telephone Encounter (Signed)
[facility] Pre-operative Risk Assessment     Request for surgical clearance:     Endoscopy Procedure  What type of surgery is being performed?     EGD/Colonoscopy  When is this surgery scheduled?     TBD  What type of clearance is required ?   Pharmacy  Are there any medications that need to be held prior to surgery and how long? Eliquis 2 days  Practice name and name of physician performing surgery?      [facility]  What is your office phone and fax number?      Phone- [phone]  Fax660 [phone]  Anesthesia type (None, local, MAC, general) ?       MAC

## 2021-12-15 DIAGNOSIS — G4733 Obstructive sleep apnea (adult) (pediatric): Secondary | ICD-10-CM | POA: Diagnosis not present

## 2021-12-15 DIAGNOSIS — Z419 Encounter for procedure for purposes other than remedying health state, unspecified: Secondary | ICD-10-CM | POA: Diagnosis not present

## 2021-12-15 NOTE — Telephone Encounter (Signed)
Clinical pharmacist to review Eliquis.  Patient has a history of LV apical thrombus.

## 2021-12-20 ENCOUNTER — Telehealth: Payer: Self-pay | Admitting: Physician Assistant

## 2021-12-20 ENCOUNTER — Other Ambulatory Visit (HOSPITAL_COMMUNITY): Payer: Self-pay

## 2021-12-20 NOTE — Telephone Encounter (Signed)
Patient called states her nausea symptoms have been a lot worst since Friday. Seeking advise.

## 2021-12-20 NOTE — Telephone Encounter (Signed)
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    Procedure: EGD/Colonoscopy Date of procedure: TBD  CrCl 82 ml/min  Will confirm with Dr. Haroldine Laws he is ok with the 2 day hold requested.  **This guidance is not considered finalized until pre-operative APP has relayed final recommendations.**

## 2021-12-20 NOTE — Telephone Encounter (Signed)
Returned call to patient. She states that when she came in for her office visit she was only having nausea after eating, now it is continuous. Pt states that she is out of Phenergan and would like a refill. Pt states that the Phenergan has not provided much relief at all. Pt states that she can eat soft solid foods, she eats several small meals a day. Pt has her CT scan tomorrow, she is nervous about keeping contrast down. I told pt that she can refrigerate the contrast and try drinking with a straw at a slow pace tomorrow. I told pt the CT results will better help you with her treatment plan. Should she continue Phenergan? Pt also reports that she had a BM today for the 1st time in 2 weeks. It was a large stool. Please advise, thanks.

## 2021-12-20 NOTE — Telephone Encounter (Signed)
   Primary Cardiologist: Skeet Latch, MD  Chart reviewed as part of pre-operative protocol coverage. Given past medical history and time since last visit, based on ACC/AHA guidelines, Christine [name] would be at acceptable risk for the planned procedure without further cardiovascular testing.   Per Dr. Haroldine Laws, ok to hold Eliquis for 2 days. Given low EF, is at moderate risk for peri-procedure CV complications (particularly with colonoscopy) . Her RCRI for MACE is 6.6%, Class III.   I will route this recommendation to the requesting party via Epic fax function and remove from pre-op pool.  Please call with questions.  Emmaline Life, NP-C     12/20/2021, 12:49 PM [address] Office [phone] Fax [phone]

## 2021-12-21 ENCOUNTER — Ambulatory Visit (HOSPITAL_COMMUNITY)
Admission: RE | Admit: 2021-12-21 | Discharge: 2021-12-21 | Disposition: A | Discharge status: Home / Self Care | Payer: Medicaid Other | Source: Ambulatory Visit | Attending: Physician Assistant | Admitting: Physician Assistant

## 2021-12-21 ENCOUNTER — Other Ambulatory Visit (HOSPITAL_COMMUNITY): Payer: Self-pay

## 2021-12-21 DIAGNOSIS — R197 Diarrhea, unspecified: Secondary | ICD-10-CM | POA: Diagnosis not present

## 2021-12-21 DIAGNOSIS — I700 Atherosclerosis of aorta: Secondary | ICD-10-CM | POA: Diagnosis not present

## 2021-12-21 DIAGNOSIS — K5900 Constipation, unspecified: Secondary | ICD-10-CM | POA: Diagnosis not present

## 2021-12-21 MED ORDER — SODIUM CHLORIDE (PF) 0.9 % IJ SOLN
INTRAMUSCULAR | Status: AC
  Filled 2021-12-21: qty 50

## 2021-12-21 MED ORDER — PROMETHAZINE HCL 25 MG PO TABS
25.0000 mg | ORAL_TABLET | Freq: Four times a day (QID) | ORAL | 0 refills | Status: DC
  Filled 2021-12-21: qty 90, 23d supply, fill #0

## 2021-12-21 MED ORDER — IOHEXOL 300 MG/ML  SOLN
100.0000 mL | Freq: Once | INTRAMUSCULAR | Status: AC | PRN
  Administered 2021-12-21: 100 mL via INTRAVENOUS

## 2021-12-21 NOTE — Telephone Encounter (Signed)
Called and spoke with patient regarding Jennifer's recommendations. Pt would like refill of Phenergan sent to [facility]. Pt has been advised to take Phenergan every 6-8 hours on a scheduled basis. Pt has been advised to take 1 Phenergan prior to starting her contrast for CT today. Pt has been advised that we hope to have more information once we receive her CT results back. Pt has not been taking Lomotil at this time. She states that she does not feel like she needs a laxative right now. Pt advised that she can take Miralax or Colace 2 tabs BID if she decides that she does need anything. Pt verbalized understanding and had no concerns at the end of the call.   Phenergan refill sent.

## 2021-12-22 ENCOUNTER — Other Ambulatory Visit (HOSPITAL_COMMUNITY): Payer: Self-pay

## 2021-12-22 ENCOUNTER — Other Ambulatory Visit: Payer: Self-pay

## 2021-12-22 DIAGNOSIS — R110 Nausea: Secondary | ICD-10-CM

## 2021-12-22 DIAGNOSIS — R197 Diarrhea, unspecified: Secondary | ICD-10-CM

## 2021-12-22 DIAGNOSIS — R634 Abnormal weight loss: Secondary | ICD-10-CM

## 2021-12-25 ENCOUNTER — Other Ambulatory Visit: Payer: Self-pay | Admitting: Hematology and Oncology

## 2021-12-25 ENCOUNTER — Other Ambulatory Visit: Payer: Self-pay

## 2021-12-25 ENCOUNTER — Inpatient Hospital Stay: Payer: Medicaid Other | Attending: Hematology and Oncology

## 2021-12-25 ENCOUNTER — Inpatient Hospital Stay (HOSPITAL_BASED_OUTPATIENT_CLINIC_OR_DEPARTMENT_OTHER): Payer: Medicaid Other | Admitting: Hematology and Oncology

## 2021-12-25 VITALS — BP 112/66 | HR 61 | Temp 98.4°F | Resp 14 | Wt 186.1 lb

## 2021-12-25 DIAGNOSIS — Z90710 Acquired absence of both cervix and uterus: Secondary | ICD-10-CM | POA: Diagnosis not present

## 2021-12-25 DIAGNOSIS — I5042 Chronic combined systolic (congestive) and diastolic (congestive) heart failure: Secondary | ICD-10-CM | POA: Insufficient documentation

## 2021-12-25 DIAGNOSIS — D751 Secondary polycythemia: Secondary | ICD-10-CM

## 2021-12-25 DIAGNOSIS — D72829 Elevated white blood cell count, unspecified: Secondary | ICD-10-CM | POA: Insufficient documentation

## 2021-12-25 DIAGNOSIS — G4733 Obstructive sleep apnea (adult) (pediatric): Secondary | ICD-10-CM | POA: Diagnosis not present

## 2021-12-25 DIAGNOSIS — Z87891 Personal history of nicotine dependence: Secondary | ICD-10-CM | POA: Diagnosis not present

## 2021-12-25 DIAGNOSIS — Z800 Family history of malignant neoplasm of digestive organs: Secondary | ICD-10-CM | POA: Diagnosis not present

## 2021-12-25 LAB — CBC WITH DIFFERENTIAL (CANCER CENTER ONLY)
Abs Immature Granulocytes: 0.02 10*3/uL (ref 0.00–0.07)
Basophils Absolute: 0.1 10*3/uL (ref 0.0–0.1)
Basophils Relative: 1 %
Eosinophils Absolute: 0.2 10*3/uL (ref 0.0–0.5)
Eosinophils Relative: 3 %
HCT: 43.4 % (ref 36.0–46.0)
Hemoglobin: 14.9 g/dL (ref 12.0–15.0)
Immature Granulocytes: 0 %
Lymphocytes Relative: 18 %
Lymphs Abs: 1.5 10*3/uL (ref 0.7–4.0)
MCH: 29.2 pg (ref 26.0–34.0)
MCHC: 34.3 g/dL (ref 30.0–36.0)
MCV: 85.1 fL (ref 80.0–100.0)
Monocytes Absolute: 0.5 10*3/uL (ref 0.1–1.0)
Monocytes Relative: 6 %
Neutro Abs: 6.2 10*3/uL (ref 1.7–7.7)
Neutrophils Relative %: 72 %
Platelet Count: 326 10*3/uL (ref 150–400)
RBC: 5.1 MIL/uL (ref 3.87–5.11)
RDW: 13.1 % (ref 11.5–15.5)
WBC Count: 8.5 10*3/uL (ref 4.0–10.5)
nRBC: 0 % (ref 0.0–0.2)

## 2021-12-25 LAB — CMP (CANCER CENTER ONLY)
ALT: 32 U/L (ref 0–44)
AST: 24 U/L (ref 15–41)
Albumin: 4.3 g/dL (ref 3.5–5.0)
Alkaline Phosphatase: 112 U/L (ref 38–126)
Anion gap: 4 — ABNORMAL LOW (ref 5–15)
BUN: 10 mg/dL (ref 6–20)
CO2: 35 mmol/L — ABNORMAL HIGH (ref 22–32)
Calcium: 9.8 mg/dL (ref 8.9–10.3)
Chloride: 100 mmol/L (ref 98–111)
Creatinine: 0.9 mg/dL (ref 0.44–1.00)
GFR, Estimated: >60 mL/min (ref >60)
Glucose, Bld: 154 mg/dL — ABNORMAL HIGH (ref 70–99)
Potassium: 3.4 mmol/L — ABNORMAL LOW (ref 3.5–5.1)
Sodium: 139 mmol/L (ref 135–145)
Total Bilirubin: 0.3 mg/dL (ref 0.3–1.2)
Total Protein: 7.3 g/dL (ref 6.5–8.1)

## 2021-12-25 NOTE — Progress Notes (Signed)
[place] [phone]   [phone]  PROGRESS NOTE  Patient Care Team: Ladell Pier, MD as PCP - General (Internal Medicine) Skeet Latch, MD as PCP - Cardiology (Cardiology) Sueanne Margarita, MD as PCP - Sleep Medicine (Cardiology)  Hematological/Oncological History # Polycythemia # Leukocytosis 01/27/2021: WBC 13.7, Hgb 17.3, Plt 403 06/26/2021: WBC 11.2, Hgb 16.3, Plt 368 08/18/2021: WBC 14.8, Hgb 16.8, Plt 410 09/20/2021: establish care with Dr. Lorenso Courier  12/25/2021: WBC 8.5, Hgb 14.9, MCV 85.1, Plt 326  Interval History:  Christine [name] 56 y.o. female with medical history significant for polycythemia and leukocytosis who presents for a follow up visit. The patient's last visit was on 09/20/2021 at which time she establish care. In the interim since the last visit she has been compliant with her CPAP machine.  On exam today Christine [name] reports she has been having a lot of GI issues with "explosive diarrhea".  She notes that she is also taking Phenergan prior to eating due to stomach upset.  She underwent a CT scan last Thursday which did not show any overt abnormalities in the abdomen to explain her GI distress, though and aortic aneurysm was noted.  She reports she continues to use her CPAP every night but is concerned about "CPAP ability" for air being pumped in the stomach.  She reports that the CPAP machine has not made her feel much better.  She is using a nasal CPAP but has not noticed any improvement in her energy levels.  She currently denies any fevers, chills, sweats  She is having the GI distress as noted above.  She denies any chest pain or shortness of breath.  She remains a non-smoker at this time.  A full 10 point ROS was otherwise negative.  MEDICAL HISTORY:  Past Medical History:  Diagnosis Date   Anxiety 01/20/2021   CAD in native artery 08/24/2021   CHF (congestive heart failure) (HCC)    Chronic combined systolic and  diastolic heart failure ([place]) 08/24/2021   Shortness of breath 01/20/2021   Sleep apnea 07/2021   Tobacco abuse 01/20/2021    SURGICAL HISTORY: Past Surgical History:  Procedure Laterality Date   ABDOMINAL HYSTERECTOMY     CARDIAC CATHETERIZATION     CHOLECYSTECTOMY     ICD IMPLANT N/A 07/21/2021   Procedure: ICD IMPLANT;  Surgeon: Deboraha Sprang, MD;  Location: [facility];  Service: Cardiovascular;  Laterality: N/A;   NECK SURGERY     OVARIAN CYST SURGERY     RIGHT/LEFT HEART CATH AND CORONARY ANGIOGRAPHY N/A 01/23/2021   Procedure: RIGHT/LEFT HEART CATH AND CORONARY ANGIOGRAPHY;  Surgeon: Jolaine Artist, MD;  Location: [facility];  Service: Cardiovascular;  Laterality: N/A;    SOCIAL HISTORY: Social History   Socioeconomic History   Marital status: Single    Spouse name: Not on file   Number of children: 0   Years of education: 16   Highest education level: Not on file  Occupational History   Not on file  Tobacco Use   Smoking status: Former    Packs/day: 0.50    Types: Cigarettes    Quit date: 01/15/2021    Years since quitting: 0.9   Smokeless tobacco: Never  Vaping Use   Vaping Use: Never used  Substance and Sexual Activity   Alcohol use: Yes    Comment: rarely   Drug use: Never   Sexual activity: Not on file  Other Topics Concern  Not on file  Social History Narrative   Not on file   Social Determinants of Health   Financial Resource Strain: Medium Risk (01/31/2021)   Overall Financial Resource Strain (CARDIA)    Difficulty of Paying Living Expenses: Somewhat hard  Food Insecurity: No Food Insecurity (01/31/2021)   Hunger Vital Sign    Worried About Running Out of Food in the Last Year: Never true    Ran Out of Food in the Last Year: Never true  Transportation Needs: No Transportation Needs (01/31/2021)   PRAPARE - Hydrologist (Medical): No    Lack of Transportation (Non-Medical): No  Physical  Activity: Not on file  Stress: Not on file  Social Connections: Not on file  Intimate Partner Violence: Not on file    FAMILY HISTORY: Family History  Problem Relation Age of Onset   Stroke Mother    Atrial fibrillation Mother    Heart failure Father    Stomach cancer Maternal Grandfather    Liver cancer Neg Hx    Esophageal cancer Neg Hx    Colon polyps Neg Hx     ALLERGIES:  is allergic to sulfa antibiotics, erythromycin, tramadol, and tape.  MEDICATIONS:  Current Outpatient Medications  Medication Sig Dispense Refill   acetaminophen (TYLENOL) 500 MG tablet Take 1,000 mg by mouth every 6 (six) hours as needed for moderate pain.     ALPRAZolam (XANAX) 1 MG tablet Take 2 mg by mouth 3 (three) times daily.     apixaban (ELIQUIS) 5 MG TABS tablet Take 1 tablet (5 mg total) by mouth 2 (two) times daily. 60 tablet 7   atorvastatin (LIPITOR) 80 MG tablet Take 1 tablet (80 mg total) by mouth daily. 30 tablet 3   cefadroxil (DURICEF) 500 MG capsule Take 1 capsule (500 mg total) by mouth 2 (two) times daily. 14 capsule 0   cyclobenzaprine (FLEXERIL) 10 MG tablet Take 20 mg by mouth at bedtime.     dapagliflozin propanediol (FARXIGA) 10 MG TABS tablet Take 1 tablet (10 mg total) by mouth daily. 30 tablet 11   desvenlafaxine (PRISTIQ) 25 MG 24 hr tablet Take 25 mg by mouth every morning. (Patient not taking: Reported on 12/14/2021)     digoxin (LANOXIN) 0.125 MG tablet Take 1 tablet (0.125 mg total) by mouth daily. 30 tablet 3   diphenhydrAMINE (BENADRYL) 25 MG tablet Take 25 mg by mouth every 6 (six) hours as needed for allergies.     diphenoxylate-atropine (LOMOTIL) 2.5-0.025 MG tablet Take 1 tablet by mouth every 6 (six) hours as needed for diarrhea or loose stools. 30 tablet 1   diphenoxylate-atropine (LOMOTIL) 2.5-0.025 MG tablet Take 1 tablet by mouth every 6 (six) hours as needed for diarrhea or loose stools. 30 tablet 1   furosemide (LASIX) 20 MG tablet Take 4 tablets (80 mg total) by  mouth in the morning AND 3 tablets (60 mg total) every evening. 210 tablet 3   ivabradine (CORLANOR) 5 MG TABS tablet Take 1 tablet (5 mg total) by mouth 2 (two) times daily with a meal. 60 tablet 6   loperamide (IMODIUM A-D) 2 MG tablet Take 2-4 mg by mouth 4 (four) times daily as needed for diarrhea or loose stools.     losartan (COZAAR) 25 MG tablet Take 0.5 tablets (12.5 mg total) by mouth daily. 30 tablet 3   metoprolol succinate (TOPROL-XL) 25 MG 24 hr tablet Take 1 tablet (25 mg total) by mouth daily. Take with  or immediately following a meal. 30 tablet 11   metroNIDAZOLE (METROGEL) 1 % gel Apply 1 application topically daily. (Patient taking differently: Apply 1 application  topically daily as needed (rosaecea).) 60 g 0   ondansetron (ZOFRAN) 4 MG tablet Take 1 tablet (4 mg total) by mouth daily as needed for nausea or vomiting. 20 tablet 0   oxymetazoline (AFRIN) 0.05 % nasal spray Place 2 sprays into both nostrils 2 (two) times daily as needed for congestion.     potassium chloride SA (KLOR-CON M) 20 MEQ tablet Take 2 tablets (40 mEq total) by mouth 2 (two) times daily. 120 tablet 1   promethazine (PHENERGAN) 25 MG tablet Take 1 tablet (25 mg total) by mouth every 6 (six) to 8 (eight) hours for nausea or vomiting. 90 tablet 0   spironolactone (ALDACTONE) 25 MG tablet Take 1 tablet (25 mg total) by mouth daily. 30 tablet 3   zolpidem (AMBIEN) 10 MG tablet Take 5-10 mg by mouth at bedtime as needed for sleep.     No current facility-administered medications for this visit.    REVIEW OF SYSTEMS:   Constitutional: ( - ) fevers, ( - )  chills , ( - ) night sweats Eyes: ( - ) blurriness of vision, ( - ) double vision, ( - ) watery eyes Ears, nose, mouth, throat, and face: ( - ) mucositis, ( - ) sore throat Respiratory: ( - ) cough, ( - ) dyspnea, ( - ) wheezes Cardiovascular: ( - ) palpitation, ( - ) chest discomfort, ( - ) lower extremity swelling Gastrointestinal:  ( - ) nausea, ( - )  heartburn, ( - ) change in bowel habits Skin: ( - ) abnormal skin rashes Lymphatics: ( - ) new lymphadenopathy, ( - ) easy bruising Neurological: ( - ) numbness, ( - ) tingling, ( - ) new weaknesses Behavioral/Psych: ( - ) mood change, ( - ) new changes  All other systems were reviewed with the patient and are negative.  PHYSICAL EXAMINATION:  Vitals:   12/25/21 1152  BP: 112/66  Pulse: 61  Resp: 14  Temp: 98.4 F (36.9 C)  SpO2: 96%   Filed Weights   12/25/21 1152  Weight: 186 lb 1.6 oz (84.4 kg)    GENERAL: Well-appearing middle-age Caucasian female, alert, no distress and comfortable SKIN: skin color, texture, turgor are normal, no rashes or significant lesions EYES: conjunctiva are pink and non-injected, sclera clear LUNGS: clear to auscultation and percussion with normal breathing effort HEART: regular rate & rhythm and no murmurs and no lower extremity edema Musculoskeletal: no cyanosis of digits and no clubbing  PSYCH: alert & oriented x 3, fluent speech NEURO: no focal motor/sensory deficits  LABORATORY DATA:  I have reviewed the data as listed    Latest Ref Rng & Units 12/25/2021   11:19 AM 11/28/2021    4:57 PM 09/20/2021    3:04 PM  CBC  WBC 4.0 - 10.5 K/uL 8.5  13.3  9.8   Hemoglobin 12.0 - 15.0 g/dL 14.9  15.3  15.3   Hematocrit 36.0 - 46.0 % 43.4  45.4  45.3   Platelets 150 - 400 K/uL 326  380  330        Latest Ref Rng & Units 12/25/2021   11:19 AM 11/28/2021    4:57 PM 11/23/2021   11:23 AM  CMP  Glucose 70 - 99 mg/dL 154  111  95   BUN 6 - 20 mg/dL 10  8  15   Creatinine 0.44 - 1.00 mg/dL 0.90  0.84  0.87   Sodium 135 - 145 mmol/L 139  139  139   Potassium 3.5 - 5.1 mmol/L 3.4  3.5  4.0   Chloride 98 - 111 mmol/L 100  101  96   CO2 22 - 32 mmol/L 35  27  25   Calcium 8.9 - 10.3 mg/dL 9.8  9.5  10.2   Total Protein 6.5 - 8.1 g/dL 7.3  7.4    Total Bilirubin 0.3 - 1.2 mg/dL 0.3  0.6    Alkaline Phos 38 - 126 U/L 112  127    AST 15 - 41 U/L 24  33     ALT 0 - 44 U/L 32  56      RADIOGRAPHIC STUDIES: CT Abdomen Pelvis W Contrast  Result Date: 12/22/2021 CLINICAL DATA:  Unintentional weight loss.  Diarrhea. EXAM: CT ABDOMEN AND PELVIS WITH CONTRAST TECHNIQUE: Multidetector CT imaging of the abdomen and pelvis was performed using the standard protocol following bolus administration of intravenous contrast. RADIATION DOSE REDUCTION: This exam was performed according to the departmental dose-optimization program which includes automated exposure control, adjustment of the mA and/or kV according to patient size and/or use of iterative reconstruction technique. CONTRAST:  134m OMNIPAQUE IOHEXOL 300 MG/ML  SOLN COMPARISON:  CT abdomen pelvis dated 09/25/2007. FINDINGS: Lower chest: The visualized lung bases are clear. An AICD device the noted in the right ventricle. No intra-abdominal free air or free fluid. Hepatobiliary: The liver is unremarkable. Mild biliary ductal dilatation, post cholecystectomy. No retained calcified stone noted in the central CBD. Pancreas: Unremarkable. No pancreatic ductal dilatation or surrounding inflammatory changes. Spleen: Normal in size without focal abnormality. Adrenals/Urinary Tract: The adrenal glands unremarkable. There is no hydronephrosis on either side. There is symmetric enhancement and excretion of contrast by both kidneys. The visualized ureters and urinary bladder appear unremarkable. Stomach/Bowel: There is moderate stool throughout the colon. There is no bowel obstruction or active inflammation. The appendix is normal. Vascular/Lymphatic: Mild aortoiliac atherosclerotic disease. There is a 3.1 cm infrarenal abdominal aortic aneurysm. The IVC is unremarkable. No portal venous gas. There is no adenopathy. Reproductive: Hysterectomy.  No adnexal masses. Other: None Musculoskeletal: No acute osseous pathology. Focal area of irregularity of the lateral left iliac bone may be related to prior bone harvest. No acute  osseous pathology. No suspicious bone lesions. IMPRESSION: 1. No acute intra-abdominal or pelvic pathology. 2. Moderate colonic stool burden. No bowel obstruction. Normal appendix. 3. A 3.1 cm infrarenal abdominal aortic aneurysm. Recommend follow-up ultrasound every 3 years. This recommendation follows ACR consensus guidelines: White Paper of the ACR Incidental Findings Committee II on Vascular Findings. J Am Coll Radiol 2013;; 70:017-494 4. Aortic Atherosclerosis (ICD10-I70.0). Electronically Signed   By: AAnner CreteM.D.   On: 12/22/2021 03:32   DG Chest 2 View  Result Date: 11/28/2021 CLINICAL DATA:  Dyspnea EXAM: CHEST - 2 VIEW COMPARISON:  08/18/2021 FINDINGS: Lungs are well expanded, symmetric, and clear. No pneumothorax or pleural effusion. Cardiac size within normal limits. Left subclavian single lead AICD unchanged. Pulmonary vascularity is normal. Osseous structures are age-appropriate. No acute bone abnormality. IMPRESSION: No active cardiopulmonary disease. Electronically Signed   By: AFidela SalisburyM.D.   On: 11/28/2021 18:58    ASSESSMENT & PLAN Christine Cavenaugh89y.o. female with medical history significant for polycythemia and leukocytosis 2/2 to OSA who presents for a follow up visit.   There are two types  of polycythemia, Primary polycythemia and secondary polycythemia. Primary polycythemia is overproduction of red blood cells due to a driver mutation. The most common mutation is the JAK2 [id number] (95% of cases), but there are other mutations which can cause this disorder. Primary polycythemia is a myeloproliferative neoplasm which may require cytoreductive therapy to decrease risk of thrombosis. This can consist of medications or phlebotomy to drive down the red blood cell counts. Secondary polycythemia is polycythemia driven by low oxygen levels. This represents an appropriate response of the body attempting to increase red cell volume. Causes of secondary polycythemia include smoking  (most common), obstructive sleep apnea (OSA), or living at altitude. This can also be caused by testosterone supplementation. Certain thalassemias can present with marked erythrocytosis , but normal hemoglobin. Secondary polycythemia does not have the same level of thrombotic risk and therefore does not require cytoreductive therapy or phlebotomy.     # OSA now on CPAP #Polycythemia, resolved   #Leukocytosis, resolved   #Thrombocytosis, resolved   --today repeated. CBC, CMP. Labs show white blood cell 8.5, hemoglobin 14.9, MCV 85.1, and platelets of 326 --patient is not active smoker.Quit Oct 2022.    --patient was diagnosed with sleep apnea and has started on a CPAP machine.  OSA is most likely etiology of her CBC abnormalities as they have normalized in the interim.  -- MPN workup with JAK2 with reflex and BCR/ABL FISH were negative.  --RTC as needed.   No orders of the defined types were placed in this encounter.   All questions were answered. The patient knows to call the clinic with any problems, questions or concerns.  A total of more than 25 minutes were spent on this encounter with face-to-face time and non-face-to-face time, including preparing to see the patient, ordering tests and/or medications, counseling the patient and coordination of care as outlined above.   Ledell Peoples, MD Department of Hematology/Oncology [facility] Phone: [phone] Pager: [phone] Email: [email or web address]  12/25/2021 2:08 PM

## 2021-12-25 NOTE — Addendum Note (Signed)
Addended by: Aura Fey A on: 12/25/2021 02:30 PM   Modules accepted: Orders

## 2021-12-26 ENCOUNTER — Telehealth (HOSPITAL_COMMUNITY): Payer: Self-pay | Admitting: *Deleted

## 2021-12-26 NOTE — Telephone Encounter (Signed)
Pt left vm stating she had an abdominal CT and they found a 3.1 cm infrarenal abdominal aortic aneurysm.  She was told to contact her cardiologist.  Routed to [place]

## 2021-12-26 NOTE — Telephone Encounter (Signed)
Pt aware.

## 2022-01-01 ENCOUNTER — Other Ambulatory Visit (HOSPITAL_COMMUNITY): Payer: Self-pay

## 2022-01-03 NOTE — Telephone Encounter (Signed)
Left message for patient to call office.  

## 2022-01-05 ENCOUNTER — Other Ambulatory Visit (HOSPITAL_COMMUNITY): Payer: Self-pay

## 2022-01-09 ENCOUNTER — Encounter: Payer: Self-pay | Admitting: Internal Medicine

## 2022-01-09 ENCOUNTER — Telehealth (HOSPITAL_COMMUNITY): Payer: Self-pay | Admitting: *Deleted

## 2022-01-09 ENCOUNTER — Ambulatory Visit: Payer: Medicaid Other | Attending: Internal Medicine | Admitting: Internal Medicine

## 2022-01-09 ENCOUNTER — Other Ambulatory Visit (HOSPITAL_COMMUNITY): Payer: Self-pay

## 2022-01-09 VITALS — BP 92/61 | HR 68 | Temp 98.0°F | Ht 70.0 in | Wt 187.6 lb

## 2022-01-09 DIAGNOSIS — G4733 Obstructive sleep apnea (adult) (pediatric): Secondary | ICD-10-CM | POA: Diagnosis not present

## 2022-01-09 DIAGNOSIS — Z23 Encounter for immunization: Secondary | ICD-10-CM

## 2022-01-09 DIAGNOSIS — I5020 Unspecified systolic (congestive) heart failure: Secondary | ICD-10-CM

## 2022-01-09 DIAGNOSIS — D751 Secondary polycythemia: Secondary | ICD-10-CM | POA: Insufficient documentation

## 2022-01-09 DIAGNOSIS — R197 Diarrhea, unspecified: Secondary | ICD-10-CM | POA: Diagnosis not present

## 2022-01-09 DIAGNOSIS — F320 Major depressive disorder, single episode, mild: Secondary | ICD-10-CM

## 2022-01-09 DIAGNOSIS — I714 Abdominal aortic aneurysm, without rupture, unspecified: Secondary | ICD-10-CM | POA: Diagnosis not present

## 2022-01-09 MED ORDER — FUROSEMIDE 40 MG PO TABS
ORAL_TABLET | ORAL | 3 refills | Status: DC
  Filled 2022-01-09: qty 120, 30d supply, fill #0
  Filled 2022-01-29: qty 120, 30d supply, fill #1
  Filled 2022-02-14: qty 120, 30d supply, fill #2

## 2022-01-09 NOTE — Progress Notes (Signed)
Discuss recent CT scan Weight gain of 10lbs since last Thursday.

## 2022-01-09 NOTE — Progress Notes (Signed)
Patient ID: Christine [name], female    DOB: Aug 09, 1965  MRN: [id number]  CC: Chronic disease management  Subjective: Christine [name] is a 56 y.o. 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  Since last visit with me, she has seen hematologist Dr. Lorenso Courier for polycythemia and leukocytosis.  His assessment was that her polycythemia was due to OSA.   OSA on CPAP: getting more use to using the CPAP machine.  Sleeping more since being on CPAP but still wakes up tired.  Had sleep doctor dec pressure on CPAP to make sure it was not contributing to GI symptoms.   Saw the gastroenterologist PA on 12/14/2021 for intermittent explosive diarrhea. In terms of work-up so far, she has had negative stool cultures, negative C. difficile and negative Cologuard.   -GI has ordered some additional stool studies including pancreatic elastase.  Not done as yet. C-scope and EDG ordered.  Scheduled for November CT of the abdomen revealed moderate stool in the colon.  Incidental finding was 3.1 cm infrarenal AAA and aortic atherosclerosis.  Radiologist recommends abdominal ultrasound in 3 years for follow-up of AAA -Started on Phenergan 25 mg every 8 hours as needed and Lomotil 1 tablet every 8 hours as needed. -Still have intermittent diarrhea episodes with some incontinence but a little better than before  CHF/CAD/LV thrombus/HL: -gained 10 lbs over the past 1 wk by her home scale measurement.  No LE edema.  Some increase SOB when moving around and first thing in the mornings since last wk. NO SOB when laying down.  Supposed to be on Lasix 80 mg in the morning and 60 mg in the evening.  She note that the shortness of breath increases 8 hours after her morning dose so she has tried taking  Lasix as 40 mg a.m, 40 mg lunch and 60 mg in evening.  Has not helped.  Called cardiology end of last wk and left message; also called today.  No returned call as yet.  Reports compliance with spironolactone 25 mg, Toprol 25 mg, Cozaar 25 1/2 tab mg, atorvastatin 80 mg all daily, furosemide 73m a.m/60 mg p.m, potassium supplement 40 mEq twice a day, Digoxin and Farxiga 10 mg daily. No bruising or bleeding on Eliquis  Pos dep screen: Sees Dr. KBuddy Duty2x/yr.  Last seen 11/2021.  Pt stopped Pristiq.  Med caused sweatiness, dizziness and ringing in ears. Feels she is doing okay in terms of depression - HM:  due for flu shot.  Did not get shingles vaccine from rxn given on last visit  Patient Active Problem List   Diagnosis Date Noted   Abdominal aortic aneurysm (AAA) 3.0 cm to 5.0 cm in diameter in female ([facility] 01/09/2022   Polycythemia 01/09/2022   Chronic combined systolic [place] diastolic heart [place] 08/24/2021   CAD in native artery 08/24/2021   S/P ICD (internal cardiac defibrillator) procedure 08/10/2021   Former smoker 02/06/2021   Left ventricular apical thrombus 02/06/2021   Obesity (BMI 30.0-34.9) 02/06/2021   Anxiety 01/20/2021   Tobacco abuse 01/20/2021   Shortness of breath 01/20/2021   Chest tightness      Current Outpatient Medications on File Prior to Visit  Medication Sig Dispense Refill   acetaminophen (TYLENOL) 500 MG tablet Take 1,000 mg by mouth  every 6 (six) hours as needed for moderate pain.     ALPRAZolam (XANAX) 1 MG tablet Take 2 mg by mouth 3 (three) times daily.     apixaban (ELIQUIS) 5 MG TABS tablet Take 1 tablet (5 mg total) by mouth 2 (two) times daily. 60 tablet 7   atorvastatin (LIPITOR) 80 MG tablet Take 1 tablet (80 mg total) by mouth daily. 30 tablet 3   cyclobenzaprine (FLEXERIL) 10 MG tablet Take 20 mg by mouth at bedtime.     dapagliflozin propanediol (FARXIGA) 10 MG TABS tablet Take 1 tablet (10 mg total) by mouth daily. 30 tablet 11   digoxin  (LANOXIN) 0.125 MG tablet Take 1 tablet (0.125 mg total) by mouth daily. 30 tablet 3   diphenhydrAMINE (BENADRYL) 25 MG tablet Take 25 mg by mouth every 6 (six) hours as needed for allergies.     diphenoxylate-atropine (LOMOTIL) 2.5-0.025 MG tablet Take 1 tablet by mouth every 6 (six) hours as needed for diarrhea or loose stools. 30 tablet 1   diphenoxylate-atropine (LOMOTIL) 2.5-0.025 MG tablet Take 1 tablet by mouth every 6 (six) hours as needed for diarrhea or loose stools. 30 tablet 1   ivabradine (CORLANOR) 5 MG TABS tablet Take 1 tablet (5 mg total) by mouth 2 (two) times daily with a meal. 60 tablet 6   loperamide (IMODIUM A-D) 2 MG tablet Take 2-4 mg by mouth 4 (four) times daily as needed for diarrhea or loose stools.     losartan (COZAAR) 25 MG tablet Take 0.5 tablets (12.5 mg total) by mouth daily. 30 tablet 3   metoprolol succinate (TOPROL-XL) 25 MG 24 hr tablet Take 1 tablet (25 mg total) by mouth daily. Take with or immediately following a meal. 30 tablet 11   metroNIDAZOLE (METROGEL) 1 % gel Apply 1 application topically daily. (Patient taking differently: Apply 1 application  topically daily as needed (rosaecea).) 60 g 0   ondansetron (ZOFRAN) 4 MG tablet Take 1 tablet (4 mg total) by mouth daily as needed for nausea or vomiting. 20 tablet 0   oxymetazoline (AFRIN) 0.05 % nasal spray Place 2 sprays into both nostrils 2 (two) times daily as needed for congestion.     potassium chloride SA (KLOR-CON M) 20 MEQ tablet Take 2 tablets (40 mEq total) by mouth 2 (two) times daily. 120 tablet 1   promethazine (PHENERGAN) 25 MG tablet Take 1 tablet (25 mg total) by mouth every 6 (six) to 8 (eight) hours for nausea or vomiting. 90 tablet 0   spironolactone (ALDACTONE) 25 MG tablet Take 1 tablet (25 mg total) by mouth daily. 30 tablet 3   zolpidem (AMBIEN) 10 MG tablet Take 5-10 mg by mouth at bedtime as needed for sleep.     No current facility-administered medications on file prior to visit.     Allergies  Allergen Reactions   Sulfa Antibiotics Hives and Itching   Erythromycin Hives   Tramadol Rash and Other (See Comments)    Urinary retention     Tape Rash    Social History   Socioeconomic History   Marital status: Single    Spouse name: Not on file   Number of children: 0   Years of education: 71   Highest education level: Not on file  Occupational History   Not on file  Tobacco Use   Smoking status: Former    Packs/day: 0.50    Types: Cigarettes    Quit date: 01/15/2021    Years since quitting:  0.9   Smokeless tobacco: Never  Vaping Use   Vaping Use: Never used  Substance and Sexual Activity   Alcohol use: Yes    Comment: rarely   Drug use: Never   Sexual activity: Not on file  Other Topics Concern   Not on file  Social History Narrative   Not on file   Social Determinants of Health   Financial Resource Strain: Medium Risk (01/31/2021)   Overall Financial Resource Strain (CARDIA)    Difficulty of Paying Living Expenses: Somewhat hard  Food Insecurity: No Food Insecurity (01/31/2021)   Hunger Vital Sign    Worried About Running Out of Food in the Last Year: Never true    Ran Out of Food in the Last Year: Never true  Transportation Needs: No Transportation Needs (01/31/2021)   PRAPARE - Hydrologist (Medical): No    Lack of Transportation (Non-Medical): No  Physical Activity: Not on file  Stress: Not on file  Social Connections: Not on file  Intimate Partner Violence: Not on file    Family History  Problem Relation Age of Onset   Stroke Mother    Atrial fibrillation Mother    Heart failure Father    Stomach cancer Maternal Grandfather    Liver cancer Neg Hx    Esophageal cancer Neg Hx    Colon polyps Neg Hx     Past Surgical History:  Procedure Laterality Date   ABDOMINAL HYSTERECTOMY     CARDIAC CATHETERIZATION     CHOLECYSTECTOMY     ICD IMPLANT N/A 07/21/2021   Procedure: ICD IMPLANT;  Surgeon:  Deboraha Sprang, MD;  Location: [place] [facility];  Service: Cardiovascular;  Laterality: N/A;   NECK SURGERY     OVARIAN CYST SURGERY     RIGHT/LEFT HEART CATH AND CORONARY ANGIOGRAPHY N/A 01/23/2021   Procedure: RIGHT/LEFT HEART CATH AND CORONARY ANGIOGRAPHY;  Surgeon: Jolaine Artist, MD;  Location: [place] [facility];  Service: Cardiovascular;  Laterality: N/A;    ROS: Review of Systems Negative except as stated above  PHYSICAL EXAM: BP 92/61   Pulse 68   Temp 98 F (36.7 C) (Oral)   Ht 5' 10"  (1.778 m)   Wt 187 lb 9.6 oz (85.1 kg)   SpO2 97%   BMI 26.92 kg/m   Wt Readings from Last 3 Encounters:  01/09/22 187 lb 9.6 oz (85.1 kg)  12/25/21 186 lb 1.6 oz (84.4 kg)  12/14/21 184 lb 3.2 oz (83.6 kg)    Physical Exam  General appearance - alert, well appearing, middle-age Caucasian female and in no distress Mental status - normal mood, behavior, speech, dress, motor activity, and thought processes Chest - clear to auscultation, no wheezes, rales or rhonchi, symmetric air entry Heart -heart is regular.  No JVD. Extremities -no lower extremity edema.     01/09/2022    1:46 PM 11/23/2021   10:34 AM 09/05/2021    2:42 PM  Depression screen PHQ 2/9  Decreased Interest 0 1 0  Down, Depressed, Hopeless 2 2 0  PHQ - 2 Score 2 3 0  Altered sleeping 3 3 3   Tired, decreased energy 3 3 2   Change in appetite 3 3 0  Feeling bad or failure about yourself  1 1 1   Trouble concentrating 1 3 1   Moving slowly or fidgety/restless 0 0 0  Suicidal thoughts 0 0 0  PHQ-9 Score 13 16 7        Latest  Ref Rng & Units 12/25/2021   11:19 AM 11/28/2021    4:57 PM 11/23/2021   11:23 AM  CMP  Glucose 70 - 99 mg/dL 154  111  95   BUN 6 - 20 mg/dL 10  8  15    Creatinine 0.44 - 1.00 mg/dL 0.90  0.84  0.87   Sodium 135 - 145 mmol/L 139  139  139   Potassium 3.5 - 5.1 mmol/L 3.4  3.5  4.0   Chloride 98 - 111 mmol/L 100  101  96   CO2 22 - 32 mmol/L 35  27  25   Calcium 8.9 - 10.3 mg/dL 9.8   9.5  10.2   Total Protein 6.5 - 8.1 g/dL 7.3  7.4    Total Bilirubin 0.3 - 1.2 mg/dL 0.3  0.6    Alkaline Phos 38 - 126 U/L 112  127    AST 15 - 41 U/L 24  33    ALT 0 - 44 U/L 32  56     Lipid Panel     Component Value Date/Time   CHOL 216 (H) 01/24/2021 0223   TRIG 179 (H) 01/24/2021 0223   HDL 43 01/24/2021 0223   CHOLHDL 5.0 01/24/2021 0223   VLDL 36 01/24/2021 0223   LDLCALC 137 (H) 01/24/2021 0223    CBC    Component Value Date/Time   WBC 8.5 12/25/2021 1119   WBC 13.3 (H) 11/28/2021 1657   RBC 5.10 12/25/2021 1119   HGB 14.9 12/25/2021 1119   HGB 17.5 (H) 08/10/2021 1023   HCT 43.4 12/25/2021 1119   HCT 50.7 (H) 08/10/2021 1023   PLT 326 12/25/2021 1119   PLT 374 08/10/2021 1023   MCV 85.1 12/25/2021 1119   MCV 86 08/10/2021 1023   MCH 29.2 12/25/2021 1119   MCHC 34.3 12/25/2021 1119   RDW 13.1 12/25/2021 1119   RDW 13.1 08/10/2021 1023   LYMPHSABS 1.5 12/25/2021 1119   LYMPHSABS 1.8 08/10/2021 1023   MONOABS 0.5 12/25/2021 1119   EOSABS 0.2 12/25/2021 1119   EOSABS 0.3 08/10/2021 1023   BASOSABS 0.1 12/25/2021 1119   BASOSABS 0.1 08/10/2021 1023    ASSESSMENT AND PLAN:  1. 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, NYHA class 2 ([place]) Patient with symptoms of weight gain and shortness of breath with ambulation.  However by a scale today she is up by only 3 pounds compared to 1 month ago.  Blood pressure reading is soft which it always is.  I recommended increasing furosemide from 80 mg in the a.m./60 mg in the p.m. to 80 mg twice a day for 4 days then back to her previous dose.  I have changed the tablets from the 20 mg tablets to the 40 mg tablets. - furosemide (LASIX) 40 MG tablet; Take 2 tablets (80 mg total) by mouth in the morning AND 2 tablets (80 mg total) every evening.  Dispense: 120 tablet; Refill: 3  2. Abdominal aortic aneurysm (AAA) 3.0 cm to 5.0 cm in diameter in female [place]) Discussed diagnosis with patient.  We will need to do  repeat imaging in the form of ultrasound in 2 to 3 years.  3. OSA (obstructive sleep apnea) Encouraged her to continue using her CPAP device.  4. Diarrhea, unspecified type Currently being worked up by GI.  Slight improvement with Lomotil.  5. Mild major depression ([place]) Plugged in with behavioral health  6. Polycythemia Due to OSA.  Evaluation by Dr. Lorenso Courier appreciated.  7. Need for immunization against influenza - Flu Vaccine QUAD 36moIM (Fluarix, Fluzone & Alfiuria Quad PF)   Patient was given the opportunity to ask questions.  Patient verbalized understanding of the plan and was able to repeat key elements of the plan.   This documentation was completed using DRadio producer  Any transcriptional errors are unintentional.  Orders Placed This Encounter  Procedures   Flu Vaccine QUAD 616moM (Fluarix, Fluzone & Alfiuria Quad PF)     Requested Prescriptions   Signed Prescriptions Disp Refills   furosemide (LASIX) 40 MG tablet 120 tablet 3    Sig: Take 2 tablets (80 mg total) by mouth in the morning AND 2 tablets (80 mg total) every evening.    Return in about 4 months (around 05/11/2022).  DeKarle PlumberMD, FACP

## 2022-01-09 NOTE — Telephone Encounter (Signed)
Pt left vm stating her weight is up 9 or 10lbs x 1week. Pt said she is more short of breath pt takes lasix 98m in the AM and 629min the PM. Pt asked if she could increase lasix.  Routed to [employer] advice

## 2022-01-09 NOTE — Patient Instructions (Signed)
Increase furosemide to 80 mg twice a day for 4 days.  After that please go back to your previous dose of 80 mg in the morning and 60 mg in the evening.

## 2022-01-10 ENCOUNTER — Other Ambulatory Visit (HOSPITAL_COMMUNITY): Payer: Self-pay

## 2022-01-10 ENCOUNTER — Other Ambulatory Visit: Payer: Self-pay | Admitting: Physician Assistant

## 2022-01-10 NOTE — Telephone Encounter (Signed)
Pt aware and agreeable with plan. Pt will have labs with pcp and faxed to our office.

## 2022-01-12 ENCOUNTER — Encounter (HOSPITAL_COMMUNITY): Payer: Self-pay | Admitting: Internal Medicine

## 2022-01-14 DIAGNOSIS — G4733 Obstructive sleep apnea (adult) (pediatric): Secondary | ICD-10-CM | POA: Diagnosis not present

## 2022-01-14 DIAGNOSIS — Z419 Encounter for procedure for purposes other than remedying health state, unspecified: Secondary | ICD-10-CM | POA: Diagnosis not present

## 2022-01-16 DIAGNOSIS — I429 Cardiomyopathy, unspecified: Secondary | ICD-10-CM | POA: Insufficient documentation

## 2022-01-17 ENCOUNTER — Encounter: Payer: Self-pay | Admitting: Internal Medicine

## 2022-01-17 ENCOUNTER — Ambulatory Visit (INDEPENDENT_AMBULATORY_CARE_PROVIDER_SITE_OTHER): Payer: Medicaid Other

## 2022-01-17 ENCOUNTER — Ambulatory Visit: Payer: Medicaid Other | Attending: Internal Medicine | Admitting: Internal Medicine

## 2022-01-17 ENCOUNTER — Other Ambulatory Visit (HOSPITAL_COMMUNITY): Payer: Self-pay

## 2022-01-17 VITALS — BP 116/74 | Ht 70.0 in | Wt 184.6 lb

## 2022-01-17 DIAGNOSIS — I509 Heart failure, unspecified: Secondary | ICD-10-CM | POA: Diagnosis not present

## 2022-01-17 DIAGNOSIS — I425 Other restrictive cardiomyopathy: Secondary | ICD-10-CM

## 2022-01-17 DIAGNOSIS — Z95810 Presence of automatic (implantable) cardiac defibrillator: Secondary | ICD-10-CM

## 2022-01-17 DIAGNOSIS — Z79899 Other long term (current) drug therapy: Secondary | ICD-10-CM

## 2022-01-17 DIAGNOSIS — R0602 Shortness of breath: Secondary | ICD-10-CM | POA: Diagnosis not present

## 2022-01-17 LAB — CUP PACEART REMOTE DEVICE CHECK
Battery Remaining Longevity: 180 mo
Battery Remaining Percentage: 100 %
Brady Statistic RV Percent Paced: 0 %
Date Time Interrogation Session: 20231004071200
HighPow Impedance: 90 Ohm
Implantable Lead Implant Date: 20230407
Implantable Lead Location: 753860
Implantable Lead Model: 138
Implantable Lead Serial Number: 303166
Implantable Pulse Generator Implant Date: 20230407
Lead Channel Impedance Value: 848 Ohm
Lead Channel Pacing Threshold Amplitude: 0.7 V
Lead Channel Pacing Threshold Pulse Width: 0.4 ms
Lead Channel Setting Pacing Amplitude: 3.5 V
Lead Channel Setting Pacing Pulse Width: 0.4 ms
Lead Channel Setting Sensing Sensitivity: 0.5 mV
Pulse Gen Serial Number: 216478

## 2022-01-17 LAB — D-DIMER, QUANTITATIVE: D-DIMER: 0.54 mg/L FEU — ABNORMAL HIGH (ref 0.00–0.49)

## 2022-01-17 MED ORDER — IVABRADINE HCL 5 MG PO TABS
2.5000 mg | ORAL_TABLET | Freq: Two times a day (BID) | ORAL | 3 refills | Status: DC
  Filled 2022-01-17 (×2): qty 90, 90d supply, fill #0

## 2022-01-17 NOTE — Progress Notes (Signed)
Patient Care Team: Ladell Pier, MD as PCP - General (Internal Medicine) Skeet Latch, MD as PCP - Cardiology (Cardiology) Sueanne Margarita, MD as PCP - Sleep Medicine (Cardiology)   HPI  Christine [name] is a 56 y.o. female seen in follow-up for an ICD-[employer] implanted 4/23 *for primary prevention in the setting of a skin cardiomyopathy following LAD occlusion   The patient denies chest pain,  nocturnal dyspnea, orthopnea or peripheral edema.  There have been lightheadedness or syncope.  Complains of palpitations, when she takes her pulse she notes that the rate is not fast but there are some palpable irregularity distinct from pauses.  She in light of this, reached out to Dr. Reine Just and asked to increase her Corlanor which he improved, however, this has been complicated by significant fatigue.  She also has had problems with her GI tract with what she described initially as diarrhea but sounds more like obstipation as she has days of runny diarrhea many days of constipation and has some hard stool  Also complaining of shortness of breath mostly with exertion  DATE TEST EF    10/22/  Echo   20% % [place] thrombus MR mod  10/22 LHC   LAD:m-100    cMRI 22% LGE consistent with MI  1/23 Echo  20%       Date Cr K Hgb dig  10/22     12.9   3/23 0.84 3.6 16.3    9/23  0.9 3.4 14.9       Records and Results Reviewed   Past Medical History:  Diagnosis Date   Anxiety 01/20/2021   CAD in native artery 08/24/2021   CHF (congestive heart failure) (HCC)    Chronic combined systolic and diastolic heart failure ([place]) 08/24/2021   Shortness of breath 01/20/2021   Sleep apnea 07/2021   Tobacco abuse 01/20/2021    Past Surgical History:  Procedure Laterality Date   ABDOMINAL HYSTERECTOMY     CARDIAC CATHETERIZATION     CHOLECYSTECTOMY     ICD IMPLANT N/A 07/21/2021   Procedure: ICD IMPLANT;  Surgeon: Deboraha Sprang, MD;  Location: [facility];   Service: Cardiovascular;  Laterality: N/A;   NECK SURGERY     OVARIAN CYST SURGERY     RIGHT/LEFT HEART CATH AND CORONARY ANGIOGRAPHY N/A 01/23/2021   Procedure: RIGHT/LEFT HEART CATH AND CORONARY ANGIOGRAPHY;  Surgeon: Jolaine Artist, MD;  Location: [facility];  Service: Cardiovascular;  Laterality: N/A;    Current Meds  Medication Sig   acetaminophen (TYLENOL) 500 MG tablet Take 1,000 mg by mouth every 6 (six) hours as needed for moderate pain.   ALPRAZolam (XANAX) 1 MG tablet Take 2 mg by mouth 3 (three) times daily.   apixaban (ELIQUIS) 5 MG TABS tablet Take 1 tablet (5 mg total) by mouth 2 (two) times daily.   atorvastatin (LIPITOR) 80 MG tablet Take 1 tablet (80 mg total) by mouth daily.   cyclobenzaprine (FLEXERIL) 10 MG tablet Take 20 mg by mouth at bedtime.   dapagliflozin propanediol (FARXIGA) 10 MG TABS tablet Take 1 tablet (10 mg total) by mouth daily.   digoxin (LANOXIN) 0.125 MG tablet Take 1 tablet (0.125 mg total) by mouth daily.   diphenhydrAMINE (BENADRYL) 25 MG tablet Take 25 mg by mouth every 6 (six) hours as needed for allergies.   diphenoxylate-atropine (LOMOTIL) 2.5-0.025 MG tablet Take 1 tablet by mouth every 6 (six) hours as  needed for diarrhea or loose stools.   diphenoxylate-atropine (LOMOTIL) 2.5-0.025 MG tablet Take 1 tablet by mouth every 6 (six) hours as needed for diarrhea or loose stools.   furosemide (LASIX) 40 MG tablet Take 2 tablets (80 mg total) by mouth in the morning AND 2 tablets (80 mg total) every evening.   ivabradine (CORLANOR) 5 MG TABS tablet Take 1 tablet (5 mg total) by mouth 2 (two) times daily with a meal. (Patient taking differently: Take 7.5 mg by mouth 2 (two) times daily with a meal.)   loperamide (IMODIUM A-D) 2 MG tablet Take 2-4 mg by mouth 4 (four) times daily as needed for diarrhea or loose stools.   losartan (COZAAR) 25 MG tablet Take 0.5 tablets (12.5 mg total) by mouth daily.   metoprolol succinate (TOPROL-XL) 25 MG 24  hr tablet Take 1 tablet (25 mg total) by mouth daily. Take with or immediately following a meal.   metroNIDAZOLE (METROGEL) 1 % gel Apply 1 application topically daily. (Patient taking differently: Apply 1 application  topically daily as needed (rosaecea).)   ondansetron (ZOFRAN) 4 MG tablet Take 1 tablet (4 mg total) by mouth daily as needed for nausea or vomiting.   oxymetazoline (AFRIN) 0.05 % nasal spray Place 2 sprays into both nostrils 2 (two) times daily as needed for congestion.   potassium chloride SA (KLOR-CON M) 20 MEQ tablet Take 2 tablets (40 mEq total) by mouth 2 (two) times daily.   promethazine (PHENERGAN) 25 MG tablet Take 1 tablet (25 mg total) by mouth every 6 (six) to 8 (eight) hours for nausea or vomiting.   spironolactone (ALDACTONE) 25 MG tablet Take 1 tablet (25 mg total) by mouth daily.   zolpidem (AMBIEN) 10 MG tablet Take 5-10 mg by mouth at bedtime as needed for sleep.    Allergies  Allergen Reactions   Sulfa Antibiotics Hives and Itching   Erythromycin Hives   Tramadol Rash and Other (See Comments)    Urinary retention     Tape Rash      Review of Systems negative except from HPI and PMH  Physical Exam BP 116/74   Ht 5' 10"  (1.778 m)   Wt 184 lb 9.6 oz (83.7 kg)   SpO2 95%   BMI 26.49 kg/m  Well developed and well nourished in no acute distress HENT normal E scleral and icterus clear Neck Supple JVP flat; carotids brisk and full Clear to ausculation Regular rate and rhythm, no murmurs gallops or rub Soft with active bowel sounds No clubbing cyanosis  Edema Alert and oriented, grossly normal motor and sensory function Skin Warm and Dry  ECG  sinus @ 73 17/10/42  CrCl cannot be calculated (Patient's most recent lab result is older than the maximum 21 days allowed.).   Assessment and  Plan  Ischemic/nonischemic cardiomyopathy   Congestive heart failure-class II   Low blood pressure   Dyspnea  Palpitations  ICD-[employer]   Patient's device site looks good apart from a small blood blister on the lateral aspect which I peeled and explored and did not find anything.  There is no other erythema.  We will have him come in in about 3 weeks for review and I have asked her to wash it twice a day with soap and water.  Device function is otherwise normal.  She is euvolemic with complaints of dyspnea. Will check BNP and dimer  She had talked to Dr. Reine Just about increasing her Corlanor, complicated by fatigue.  Mean heart rates only about 72; I suggested she go back down to 5 mg and then prior to her visit with Dr. Reine Just in a month, about 2 weeks before she goes, to decrease it to 2.5 and see how she does.  Her last potassium was low.  Increase potassium has been in GI struggle.  She is already on spironolactone.  We will check it again as well as a digoxin level.  Her dyspnea is a little bit perplexing to me; medications are optimized.  Her neck veins are flat; there could be left ventricular pressure issue; we will check a BNP but also a D-dimer given the marked change in her functional activities levels of late    Current medicines are reviewed at length with the patient today .  The patient does not  have concerns regarding medicines.

## 2022-01-17 NOTE — Patient Instructions (Signed)
Medication Instructions:  Your physician has recommended you make the following change in your medication:   ** Begin Corlanor 2.43m - 1 tablet by mouth twice daily  *If you need a refill on your cardiac medications before your next appointment, please call your pharmacy*   Lab Work: D-Dimer, BNP,BMET and Dig level today  If you have labs (blood work) drawn today and your tests are completely normal, you will receive your results only by: [place](if you have MyChart) OR A paper copy in the mail If you have any lab test that is abnormal or we need to change your treatment, we will call you to review the results.   Testing/Procedures: None ordered.    Follow-Up: [facility] and your health needs are our priority.  As part of our continuing mission to provide you with exceptional heart care, we have created designated Provider Care Teams.  These Care Teams include your primary Cardiologist (physician) and Advanced Practice Providers (APPs -  Physician Assistants and Nurse Practitioners) who all work together to provide you with the care you need, when you need it.  We recommend signing up for the patient portal called "MyChart".  Sign up information is provided on this After Visit Summary.  MyChart is used to connect with patients for Virtual Visits (Telemedicine).  Patients are able to view lab/test results, encounter notes, upcoming appointments, etc.  Non-urgent messages can be sent to your provider as well.   To learn more about what you can do with MyChart, go to [email or web address]    Your next appointment:   Wound check in 3-4 weeks  Important Information About Sugar

## 2022-01-18 LAB — DIGOXIN LEVEL: Digoxin, Serum: 0.7 ng/mL (ref 0.5–0.9)

## 2022-01-18 LAB — PRO B NATRIURETIC PEPTIDE: NT-Pro BNP: 328 pg/mL — ABNORMAL HIGH (ref 0–249)

## 2022-01-18 LAB — BASIC METABOLIC PANEL
BUN/Creatinine Ratio: 9 (ref 9–23)
BUN: 11 mg/dL (ref 6–24)
CO2: 25 mmol/L (ref 20–29)
Calcium: 10.1 mg/dL (ref 8.7–10.2)
Chloride: 96 mmol/L (ref 96–106)
Creatinine, Ser: 1.2 mg/dL — ABNORMAL HIGH (ref 0.57–1.00)
Glucose: 107 mg/dL — ABNORMAL HIGH (ref 70–99)
Potassium: 3.7 mmol/L (ref 3.5–5.2)
Sodium: 141 mmol/L (ref 134–144)
eGFR: 54 mL/min/{1.73_m2} — ABNORMAL LOW (ref >59)

## 2022-01-19 ENCOUNTER — Other Ambulatory Visit (HOSPITAL_COMMUNITY): Payer: Self-pay | Admitting: Cardiology

## 2022-01-19 ENCOUNTER — Other Ambulatory Visit (HOSPITAL_COMMUNITY): Payer: Self-pay

## 2022-01-19 MED ORDER — ATORVASTATIN CALCIUM 80 MG PO TABS
80.0000 mg | ORAL_TABLET | Freq: Every day | ORAL | 3 refills | Status: DC
  Filled 2022-01-19: qty 90, 90d supply, fill #0
  Filled 2022-04-21: qty 90, 90d supply, fill #1
  Filled 2022-07-17: qty 90, 90d supply, fill #2
  Filled 2022-10-16: qty 90, 90d supply, fill #3

## 2022-01-24 ENCOUNTER — Other Ambulatory Visit (HOSPITAL_COMMUNITY): Payer: Self-pay

## 2022-01-26 ENCOUNTER — Other Ambulatory Visit (HOSPITAL_COMMUNITY): Payer: Self-pay

## 2022-01-26 ENCOUNTER — Telehealth: Payer: Self-pay

## 2022-01-26 ENCOUNTER — Encounter (HOSPITAL_COMMUNITY): Payer: Self-pay | Admitting: Internal Medicine

## 2022-01-26 NOTE — Telephone Encounter (Signed)
Spoke to patient about incision. Patient reports of pain at incision area after Dr. Caryl Comes manipulated incision looking for stich at office last visit. States she is concerned since she started having pain at site and some redness. Advised patient redness can occur as our bodies natural response to inflammation. Patient denies any drainage, bleeding, fever, warmth or red streaks from incision. Advised patient to continue to monitor for any s/s of infection and she may also take tylenol for pain. Also advised per Dr. Caryl Comes to wash incision twice a day with clean wash cloth each time, warm water and soap. Patient voiced understanding.

## 2022-01-26 NOTE — Progress Notes (Signed)
Remote ICD transmission.   

## 2022-01-26 NOTE — Telephone Encounter (Signed)
The patient called in stating the spot doctor Caryl Comes thought was a stitch is now hurting her. She also complain of being extremely fatigue, and sweating a lot. I let her speak with Leigh, rn.

## 2022-01-29 ENCOUNTER — Other Ambulatory Visit: Payer: Self-pay | Admitting: Physician Assistant

## 2022-01-29 ENCOUNTER — Other Ambulatory Visit (HOSPITAL_COMMUNITY): Payer: Self-pay

## 2022-01-30 ENCOUNTER — Telehealth: Payer: Self-pay | Admitting: *Deleted

## 2022-01-30 ENCOUNTER — Other Ambulatory Visit (HOSPITAL_COMMUNITY): Payer: Self-pay

## 2022-01-30 MED ORDER — DIPHENOXYLATE-ATROPINE 2.5-0.025 MG PO TABS: 1.0000 | ORAL_TABLET | Freq: Four times a day (QID) | ORAL | 1 refills | Status: DC | PRN

## 2022-01-30 MED ORDER — DIPHENOXYLATE-ATROPINE 2.5-0.025 MG PO TABS
1.0000 | ORAL_TABLET | Freq: Four times a day (QID) | ORAL | 1 refills | Status: DC | PRN
  Filled 2022-01-30: qty 30, 8d supply, fill #0

## 2022-01-30 NOTE — Telephone Encounter (Signed)
Yessi,  This pt's cardiac EF is < 20% which is below the [place] standard, so her procedure will need to be done at the hospital.  Thanks,  Osvaldo Angst

## 2022-01-31 NOTE — Telephone Encounter (Signed)
Procedure has been rescheduled to hospital on 02-12-2022.

## 2022-02-05 NOTE — Progress Notes (Deleted)
Electrophysiology Office Note Date: 02/05/2022  ID:  Christine [name], DOB 1966/01/31, MRN [id number]  PCP: Ladell Pier, MD Primary Cardiologist: Skeet Latch, MD Electrophysiologist: Virl Axe, MD   CC: Routine ICD follow-up  Christine [name] is a 56 y.o. female seen today for Virl Axe, MD for routine electrophysiology followup.   Seen by Dr. Caryl Comes 01/17/2022. Noted "small blood blister" on the lasteral aspect which he peeled and explored and was unremarkable. He also increased her Corlanor due to fatigue.   Since last being seen in our clinic the patient reports doing ***.  she denies chest pain, palpitations, dyspnea, PND, orthopnea, nausea, vomiting, dizziness, syncope, edema, weight gain, or early satiety.     {He/she (caps):30048} has not had ICD shocks.   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  Past Medical History:  Diagnosis Date   Anxiety 01/20/2021   CAD in native artery 08/24/2021   CHF (congestive heart failure) (HCC)    Chronic combined systolic and diastolic heart failure ([place]) 08/24/2021   Shortness of breath 01/20/2021   Sleep apnea 07/2021   Tobacco abuse 01/20/2021   Past Surgical History:  Procedure Laterality Date   ABDOMINAL HYSTERECTOMY     CARDIAC CATHETERIZATION     CHOLECYSTECTOMY     ICD IMPLANT N/A 07/21/2021   Procedure: ICD IMPLANT;  Surgeon: Deboraha Sprang, MD;  Location: [facility];  Service: Cardiovascular;  Laterality: N/A;   NECK SURGERY     OVARIAN CYST SURGERY     RIGHT/LEFT HEART CATH AND CORONARY ANGIOGRAPHY N/A 01/23/2021   Procedure: RIGHT/LEFT HEART CATH AND CORONARY ANGIOGRAPHY;  Surgeon: Jolaine Artist, MD;  Location: [facility];  Service: Cardiovascular;  Laterality: N/A;    Current Outpatient Medications  Medication Sig Dispense Refill   acetaminophen (TYLENOL) 500 MG tablet Take 1,000 mg by mouth every 6 (six) hours as needed for moderate pain.      ALPRAZolam (XANAX) 1 MG tablet Take 2 mg by mouth 3 (three) times daily.     apixaban (ELIQUIS) 5 MG TABS tablet Take 1 tablet (5 mg total) by mouth 2 (two) times daily. 60 tablet 7   atorvastatin (LIPITOR) 80 MG tablet Take 1 tablet (80 mg total) by mouth daily. 90 tablet 3   cyclobenzaprine (FLEXERIL) 10 MG tablet Take 20 mg by mouth at bedtime.     dapagliflozin propanediol (FARXIGA) 10 MG TABS tablet Take 1 tablet (10 mg total) by mouth daily. 30 tablet 11   digoxin (LANOXIN) 0.125 MG tablet Take 1 tablet (0.125 mg total) by mouth daily. 30 tablet 3   diphenhydrAMINE (BENADRYL) 25 MG tablet Take 25 mg by mouth every 6 (six) hours as needed for allergies.     diphenoxylate-atropine (LOMOTIL) 2.5-0.025 MG tablet Take 1 tablet by mouth every 6 (six) hours as needed for diarrhea or loose stools. 30 tablet 1   diphenoxylate-atropine (LOMOTIL) 2.5-0.025 MG tablet Take 1 tablet by mouth every 6 (six) hours as needed for diarrhea or loose stools. 30 tablet 1   diphenoxylate-atropine (LOMOTIL) 2.5-0.025 MG tablet Take 1 tablet by mouth every 6 (six) hours as needed for diarrhea or loose stools. 30 tablet 1   furosemide (LASIX) 40 MG tablet Take 2 tablets (80 mg total) by mouth in the morning AND 2 tablets (80 mg total) every evening. 120 tablet 3   ivabradine (CORLANOR) 5 MG TABS tablet Take 0.5 tablets (2.5 mg total) by mouth 2 (two) times  daily with a meal. 90 tablet 3   loperamide (IMODIUM A-D) 2 MG tablet Take 2-4 mg by mouth 4 (four) times daily as needed for diarrhea or loose stools.     losartan (COZAAR) 25 MG tablet Take 0.5 tablets (12.5 mg total) by mouth daily. 30 tablet 3   metoprolol succinate (TOPROL-XL) 25 MG 24 hr tablet Take 1 tablet (25 mg total) by mouth daily. Take with or immediately following a meal. 30 tablet 11   metroNIDAZOLE (METROGEL) 1 % gel Apply 1 application topically daily. (Patient taking differently: Apply 1 application  topically daily as needed (rosaecea).) 60 g 0    ondansetron (ZOFRAN) 4 MG tablet Take 1 tablet (4 mg total) by mouth daily as needed for nausea or vomiting. 20 tablet 0   oxymetazoline (AFRIN) 0.05 % nasal spray Place 2 sprays into both nostrils 2 (two) times daily as needed for congestion.     potassium chloride SA (KLOR-CON M) 20 MEQ tablet Take 2 tablets (40 mEq total) by mouth 2 (two) times daily. 120 tablet 1   promethazine (PHENERGAN) 25 MG tablet Take 1 tablet (25 mg total) by mouth every 6 (six) to 8 (eight) hours for nausea or vomiting. 90 tablet 0   spironolactone (ALDACTONE) 25 MG tablet Take 1 tablet (25 mg total) by mouth daily. 30 tablet 3   zolpidem (AMBIEN) 10 MG tablet Take 5-10 mg by mouth at bedtime as needed for sleep.     No current facility-administered medications for this visit.    Allergies:   Sulfa antibiotics, Erythromycin, Tramadol, and Tape   Social History: Social History   Socioeconomic History   Marital status: Single    Spouse name: Not on file   Number of children: 0   Years of education: 16   Highest education level: Not on file  Occupational History   Not on file  Tobacco Use   Smoking status: Former    Packs/day: 0.50    Types: Cigarettes    Quit date: 01/15/2021    Years since quitting: 1.0   Smokeless tobacco: Never  Vaping Use   Vaping Use: Never used  Substance and Sexual Activity   Alcohol use: Yes    Comment: rarely   Drug use: Never   Sexual activity: Not on file  Other Topics Concern   Not on file  Social History Narrative   Not on file   Social Determinants of Health   Financial Resource Strain: Medium Risk (01/31/2021)   Overall Financial Resource Strain (CARDIA)    Difficulty of Paying Living Expenses: Somewhat hard  Food Insecurity: No Food Insecurity (01/31/2021)   Hunger Vital Sign    Worried About Running Out of Food in the Last Year: Never true    Ran Out of Food in the Last Year: Never true  Transportation Needs: No Transportation Needs (01/31/2021)    PRAPARE - Hydrologist (Medical): No    Lack of Transportation (Non-Medical): No  Physical Activity: Not on file  Stress: Not on file  Social Connections: Not on file  Intimate Partner Violence: Not on file    Family History: Family History  Problem Relation Age of Onset   Stroke Mother    Atrial fibrillation Mother    Heart failure Father    Stomach cancer Maternal Grandfather    Liver cancer Neg Hx    Esophageal cancer Neg Hx    Colon polyps Neg Hx     Review of  Systems: All other systems reviewed and are otherwise negative except as noted above.   Physical Exam: There were no vitals filed for this visit.   GEN- The patient is well appearing, alert and oriented x 3 today.   HEENT: normocephalic, atraumatic; sclera clear, conjunctiva pink; hearing intact; oropharynx clear; neck supple, no JVP Lymph- no cervical lymphadenopathy Lungs- Clear to ausculation bilaterally, normal work of breathing.  No wheezes, rales, rhonchi Heart- {Blank single:19197::"Regular","Irregularly irregular"}  rate and rhythm, no murmurs, rubs or gallops, PMI not laterally displaced GI- soft, non-tender, non-distended, bowel sounds present, no hepatosplenomegaly Extremities- no clubbing or cyanosis. {EDEMA CBJSE:83151} peripheral edema; DP/PT/radial pulses 2+ bilaterally MS- no significant deformity or atrophy Skin- warm and dry, no rash or lesion; ICD pocket well healed Psych- euthymic mood, full affect Neuro- strength and sensation are intact  ICD interrogation- reviewed in detail today,  See PACEART report  EKG:  EKG is not ordered today.  Recent Labs: 04/24/2021: Magnesium 2.1 11/23/2021: TSH 1.830 11/28/2021: B Natriuretic Peptide 81.8 12/25/2021: ALT 32; Hemoglobin 14.9; Platelet Count 326 01/17/2022: BUN 11; Creatinine, Ser 1.20; NT-Pro BNP 328; Potassium 3.7; Sodium 141   Wt Readings from Last 3 Encounters:  01/17/22 184 lb 9.6 oz (83.7 kg)  01/09/22 187 lb 9.6  oz (85.1 kg)  12/25/21 186 lb 1.6 oz (84.4 kg)     Other studies Reviewed: Additional studies/ records that were reviewed today include: Previous EP office notes.   Assessment and Plan:  1.  Chronic systolic dysfunction s/p {Blank single:19197::"Medtronic","St. Jude","Boston Scientific","Biotronik"} {Blank single:19197::"***","single chamber ICD","dual chamber ICD","CRT-D","S-ICD"}  euvolemic today Stable on an appropriate medical regimen Normal ICD function See Pace Art report No changes today   Current medicines are reviewed at length with the patient today.   =  Labs/ tests ordered today include: *** No orders of the defined types were placed in this encounter.    Disposition:   Follow up with {EPMDS:28135} {Blank single:19197::"in 2 weeks","in 4 weeks","in 3 months","in 6 months","in 12 months","as usual post gen change"}    Signed, Shirley Friar, PA-C  02/05/2022 11:58 AM  [facility] [address] [place] [address] [phone] (office) [phone] (fax)

## 2022-02-07 ENCOUNTER — Other Ambulatory Visit: Payer: Self-pay | Admitting: Internal Medicine

## 2022-02-07 ENCOUNTER — Ambulatory Visit: Payer: Medicaid Other | Attending: Internal Medicine | Admitting: Internal Medicine

## 2022-02-07 ENCOUNTER — Ambulatory Visit: Payer: Medicaid Other

## 2022-02-07 ENCOUNTER — Encounter: Payer: Medicaid Other | Admitting: Student

## 2022-02-07 ENCOUNTER — Encounter: Payer: Self-pay | Admitting: Internal Medicine

## 2022-02-07 VITALS — BP 109/72 | HR 77 | Ht 70.0 in

## 2022-02-07 DIAGNOSIS — R0602 Shortness of breath: Secondary | ICD-10-CM

## 2022-02-07 DIAGNOSIS — I509 Heart failure, unspecified: Secondary | ICD-10-CM

## 2022-02-07 DIAGNOSIS — I425 Other restrictive cardiomyopathy: Secondary | ICD-10-CM

## 2022-02-07 DIAGNOSIS — R5383 Other fatigue: Secondary | ICD-10-CM

## 2022-02-07 NOTE — Patient Instructions (Signed)
Medication Instructions:  Your physician has recommended you make the following change in your medication:   ** Stop Corlanor  *If you need a refill on your cardiac medications before your next appointment, please call your pharmacy*   Lab Work:  ** Blood Cultures x 2  If you have labs (blood work) drawn today and your tests are completely normal, you will receive your results only by: [place] (if you have MyChart) OR A paper copy in the mail If you have any lab test that is abnormal or we need to change your treatment, we will call you to review the results.   Testing/Procedures: None ordered.    Follow-Up: At [facility], you and your health needs are our priority.  As part of our continuing mission to provide you with exceptional heart care, we have created designated Provider Care Teams.  These Care Teams include your primary Cardiologist (physician) and Advanced Practice Providers (APPs -  Physician Assistants and Nurse Practitioners) who all work together to provide you with the care you need, when you need it.  We recommend signing up for the patient portal called "MyChart".  Sign up information is provided on this After Visit Summary.  MyChart is used to connect with patients for Virtual Visits (Telemedicine).  Patients are able to view lab/test results, encounter notes, upcoming appointments, etc.  Non-urgent messages can be sent to your provider as well.   To learn more about what you can do with MyChart, go to [email or web address].    Your next appointment:   Follow up as scheduled  Important Information About Sugar

## 2022-02-07 NOTE — Progress Notes (Signed)
Patient Care Team: Ladell Pier, MD as PCP - General (Internal Medicine) Skeet Latch, MD as PCP - Cardiology (Cardiology) Sueanne Margarita, MD as PCP - Sleep Medicine (Cardiology) Deboraha Sprang, MD as PCP - Electrophysiology (Cardiology)   HPI  Christine [name] is a 56 y.o. female seen in follow-up for an ICD-[employer] implanted 4/23 *for primary prevention in the setting of a skin cardiomyopathy following LAD occlusion  She is seen as an add-on today.  She has complaints of a chest discomfort, it is left inframammary, comes in bursts lasting each a couple of seconds and then has a hiatus for hours before it returns.  Started last week was notably mostly in the morning, over the last days it has become more frequent and throughout the day.  In between the episodes she notes no change in her functional status either dyspnea or lightheadedness.  She also complains of significant fatigue.  She had commented on this at her last visit with a concern that it was related to the ivabradine.  We had down titrated to, her heart rates remain with a mean in the 70s and 80s.  Basically unchanged at this juncture.  She also has noted some chills without sweats. DATE TEST EF    10/22/  Echo   20% % [place] thrombus MR mod  10/22 LHC   LAD:m-100    cMRI 22% LGE consistent with MI  1/23 Echo  20%       Date Cr K Hgb dig  10/22     12.9   3/23 0.84 3.6 16.3    9/23  0.9 3.4 14.9       Records and Results Reviewed   Past Medical History:  Diagnosis Date   Anxiety 01/20/2021   CAD in native artery 08/24/2021   CHF (congestive heart failure) ([place])    Chronic combined systolic and diastolic heart failure ([place]) 08/24/2021   Shortness of breath 01/20/2021   Sleep apnea 07/2021   Tobacco abuse 01/20/2021    Past Surgical History:  Procedure Laterality Date   ABDOMINAL HYSTERECTOMY     CARDIAC CATHETERIZATION     CHOLECYSTECTOMY     ICD IMPLANT N/A 07/21/2021    Procedure: ICD IMPLANT;  Surgeon: Deboraha Sprang, MD;  Location: [facility];  Service: Cardiovascular;  Laterality: N/A;   NECK SURGERY     OVARIAN CYST SURGERY     RIGHT/LEFT HEART CATH AND CORONARY ANGIOGRAPHY N/A 01/23/2021   Procedure: RIGHT/LEFT HEART CATH AND CORONARY ANGIOGRAPHY;  Surgeon: Jolaine Artist, MD;  Location: [facility];  Service: Cardiovascular;  Laterality: N/A;    Current Meds  Medication Sig   acetaminophen (TYLENOL) 500 MG tablet Take 1,000 mg by mouth every 6 (six) hours as needed for moderate pain.   ALPRAZolam (XANAX) 1 MG tablet Take 2 mg by mouth 3 (three) times daily.   apixaban (ELIQUIS) 5 MG TABS tablet Take 1 tablet (5 mg total) by mouth 2 (two) times daily.   atorvastatin (LIPITOR) 80 MG tablet Take 1 tablet (80 mg total) by mouth daily.   cyclobenzaprine (FLEXERIL) 10 MG tablet Take 20 mg by mouth at bedtime.   dapagliflozin propanediol (FARXIGA) 10 MG TABS tablet Take 1 tablet (10 mg total) by mouth daily.   digoxin (LANOXIN) 0.125 MG tablet Take 1 tablet (0.125 mg total) by mouth daily.   diphenhydrAMINE (BENADRYL) 25 MG tablet Take 25 mg by mouth every  6 (six) hours as needed for allergies.   diphenoxylate-atropine (LOMOTIL) 2.5-0.025 MG tablet Take 1 tablet by mouth every 6 (six) hours as needed for diarrhea or loose stools.   diphenoxylate-atropine (LOMOTIL) 2.5-0.025 MG tablet Take 1 tablet by mouth every 6 (six) hours as needed for diarrhea or loose stools.   diphenoxylate-atropine (LOMOTIL) 2.5-0.025 MG tablet Take 1 tablet by mouth every 6 (six) hours as needed for diarrhea or loose stools.   furosemide (LASIX) 40 MG tablet Take 2 tablets (80 mg total) by mouth in the morning AND 2 tablets (80 mg total) every evening.   ivabradine (CORLANOR) 5 MG TABS tablet Take 0.5 tablets (2.5 mg total) by mouth 2 (two) times daily with a meal.   loperamide (IMODIUM A-D) 2 MG tablet Take 2-4 mg by mouth 4 (four) times daily as needed for  diarrhea or loose stools.   losartan (COZAAR) 25 MG tablet Take 0.5 tablets (12.5 mg total) by mouth daily.   metoprolol succinate (TOPROL-XL) 25 MG 24 hr tablet Take 1 tablet (25 mg total) by mouth daily. Take with or immediately following a meal.   metroNIDAZOLE (METROGEL) 1 % gel Apply 1 application topically daily. (Patient taking differently: Apply 1 application  topically daily as needed (rosaecea).)   ondansetron (ZOFRAN) 4 MG tablet Take 1 tablet (4 mg total) by mouth daily as needed for nausea or vomiting.   oxymetazoline (AFRIN) 0.05 % nasal spray Place 2 sprays into both nostrils 2 (two) times daily as needed for congestion.   potassium chloride SA (KLOR-CON M) 20 MEQ tablet Take 2 tablets (40 mEq total) by mouth 2 (two) times daily.   promethazine (PHENERGAN) 25 MG tablet Take 1 tablet (25 mg total) by mouth every 6 (six) to 8 (eight) hours for nausea or vomiting.   spironolactone (ALDACTONE) 25 MG tablet Take 1 tablet (25 mg total) by mouth daily.   zolpidem (AMBIEN) 10 MG tablet Take 5-10 mg by mouth at bedtime as needed for sleep.    Allergies  Allergen Reactions   Sulfa Antibiotics Hives and Itching   Erythromycin Hives   Tramadol Rash and Other (See Comments)    Urinary retention     Tape Rash      Review of Systems negative except from HPI and PMH  Physical Exam BP 109/72   Pulse 77   Ht 5' 10"  (1.778 m)   SpO2 94%   BMI 26.49 kg/m  Well developed and well nourished in no acute distress HENT normal Neck supple with JVP-7-8 Clear Device pocket well healed; without hematoma or erythema.  There is no tethering  Regular rate and rhythm, no  gallop N murmur Abd-soft with active BS No Clubbing cyanosis  edema Skin-warm and dry A & Oriented  Grossly normal sensory and motor function  ECG sinus at 77 17/10/41 Q waves 2, 3, F, V3-V6 unchanged from 8/23   CrCl cannot be calculated (Unknown ideal weight.).   Assessment and  Plan  Ischemic/nonischemic  cardiomyopathy   Congestive heart failure-class II   Low blood pressure   Dyspnea  Palpitations  ICD-[employer]   Complaint of shortness of breath.  Her volume is up a little bit compared to 3 weeks ago at which time her BNP was mildly elevated.  Her response to her bedtime diuretic is almost nil, so I will have her take her second dose at the midday as she has a normal response to her morning dose.  We will do this for 5  days and then have her resume her normal dosing schedule.  The device site is well-healed  The fatigue is befuddling.  No new medications, and with the few chills that she has had albeit without sweats, we will undertake blood cultures to make sure there is no evidence of a device related infection   It had been her impression previously that the Corlanor has been associated with fatigue.  Her heart rates remain well controlled and there was no right shift and heart rate histogram really with the down titration of her Corlanor undertaken a couple of weeks ago.  We will have her stop her Corlanor altogether until she sees Dr. Haroldine Laws   Current medicines are reviewed at length with the patient today .  The patient does not  have concerns regarding medicines.

## 2022-02-08 ENCOUNTER — Encounter: Payer: Self-pay | Admitting: Internal Medicine

## 2022-02-08 DIAGNOSIS — R5383 Other fatigue: Secondary | ICD-10-CM

## 2022-02-08 DIAGNOSIS — R002 Palpitations: Secondary | ICD-10-CM

## 2022-02-08 DIAGNOSIS — R0602 Shortness of breath: Secondary | ICD-10-CM

## 2022-02-08 DIAGNOSIS — R000 Tachycardia, unspecified: Secondary | ICD-10-CM

## 2022-02-08 LAB — CUP PACEART INCLINIC DEVICE CHECK
Date Time Interrogation Session: 20231025000000
HighPow Impedance: 95 Ohm
HighPow Impedance: 95 Ohm
Implantable Lead Connection Status: 753985
Implantable Lead Implant Date: 20230407
Implantable Lead Location: 753860
Implantable Lead Model: 138
Implantable Lead Serial Number: 303166
Implantable Pulse Generator Implant Date: 20230407
Lead Channel Impedance Value: 909 Ohm
Lead Channel Impedance Value: 909 Ohm
Lead Channel Pacing Threshold Amplitude: 0.8 V
Lead Channel Pacing Threshold Amplitude: 0.8 V
Lead Channel Pacing Threshold Pulse Width: 0.4 ms
Lead Channel Pacing Threshold Pulse Width: 0.4 ms
Lead Channel Sensing Intrinsic Amplitude: 25 mV
Lead Channel Sensing Intrinsic Amplitude: 25 mV
Lead Channel Setting Pacing Amplitude: 2.5 V
Lead Channel Setting Pacing Pulse Width: 0.4 ms
Lead Channel Setting Sensing Sensitivity: 0.5 mV
Pulse Gen Serial Number: 216478
Zone Setting Status: 755011

## 2022-02-09 ENCOUNTER — Other Ambulatory Visit (INDEPENDENT_AMBULATORY_CARE_PROVIDER_SITE_OTHER): Payer: Medicaid Other

## 2022-02-09 DIAGNOSIS — R002 Palpitations: Secondary | ICD-10-CM

## 2022-02-09 DIAGNOSIS — R000 Tachycardia, unspecified: Secondary | ICD-10-CM

## 2022-02-09 NOTE — Progress Notes (Unsigned)
Enrolled patient for a 14 day Zio AT monitor to be mailed to patients home

## 2022-02-09 NOTE — Telephone Encounter (Signed)
Deboraha Sprang, MD     02/09/22 10:26 AM Let's doZio AT for 14 days. Plz

## 2022-02-11 ENCOUNTER — Other Ambulatory Visit (HOSPITAL_COMMUNITY): Payer: Self-pay | Admitting: Cardiology

## 2022-02-12 ENCOUNTER — Telehealth: Payer: Self-pay | Admitting: *Deleted

## 2022-02-12 ENCOUNTER — Ambulatory Visit (AMBULATORY_SURGERY_CENTER): Payer: Self-pay | Admitting: *Deleted

## 2022-02-12 ENCOUNTER — Other Ambulatory Visit: Payer: Self-pay

## 2022-02-12 ENCOUNTER — Other Ambulatory Visit (HOSPITAL_COMMUNITY): Payer: Self-pay

## 2022-02-12 VITALS — Ht 70.0 in | Wt 186.0 lb

## 2022-02-12 DIAGNOSIS — R110 Nausea: Secondary | ICD-10-CM

## 2022-02-12 DIAGNOSIS — R634 Abnormal weight loss: Secondary | ICD-10-CM

## 2022-02-12 MED ORDER — SPIRONOLACTONE 25 MG PO TABS
25.0000 mg | ORAL_TABLET | Freq: Every day | ORAL | 3 refills | Status: DC
  Filled 2022-02-12: qty 30, 30d supply, fill #0

## 2022-02-12 MED ORDER — DIGOXIN 125 MCG PO TABS
0.1250 mg | ORAL_TABLET | Freq: Every day | ORAL | 3 refills | Status: DC
  Filled 2022-02-12: qty 30, 30d supply, fill #0
  Filled 2022-03-13: qty 30, 30d supply, fill #1
  Filled 2022-04-16: qty 30, 30d supply, fill #2
  Filled 2022-05-21: qty 30, 30d supply, fill #3

## 2022-02-12 MED ORDER — SUPREP BOWEL PREP KIT 17.5-3.13-1.6 GM/177ML PO SOLN
1.0000 | Freq: Once | ORAL | 0 refills | Status: AC
  Filled 2022-02-12: qty 354, 1d supply, fill #0

## 2022-02-12 NOTE — Progress Notes (Addendum)
Pre visit completed in person  Patient's instructions are for 2 day, but patient is going to follow normal split dose of suprep, no extra miralax.  Patient does not have constipation and is on fluid restriction for CHF.  No egg or soy allergy known to patient  No issues known to pt with past sedation with any surgeries or procedures Patient denies ever being told they had issues or difficulty with intubation  No FH of Malignant Hyperthermia Pt is not on diet pills Pt is not on  home 02  Pt is not on blood thinners  Pt denies issues with constipation  No A fib or A flutter  Pt instructed to use Singlecare.[email or web address] or GoodRx for a price reduction on prep

## 2022-02-12 NOTE — Telephone Encounter (Signed)
Patient notified that stool studies not needed if she is not currently having issues.

## 2022-02-12 NOTE — Telephone Encounter (Signed)
Anderson Malta, Please clarify the orders you had put on for stool studies.  Patient unclear if you wanted these done before colonoscopy or only if she if she ended up in ER with another diarrhea episode.

## 2022-02-12 NOTE — Telephone Encounter (Unsigned)
Hi Nabina- This patient is scheduled at [facility], It is supposed to be a double but it is scheduled as just Endo.,  Please contact [place] to have scheduled adjusted.  Thanks

## 2022-02-13 ENCOUNTER — Other Ambulatory Visit (HOSPITAL_COMMUNITY): Payer: Self-pay

## 2022-02-13 DIAGNOSIS — G4733 Obstructive sleep apnea (adult) (pediatric): Secondary | ICD-10-CM | POA: Diagnosis not present

## 2022-02-13 LAB — CULTURE, BLOOD (SINGLE)

## 2022-02-14 ENCOUNTER — Other Ambulatory Visit (HOSPITAL_COMMUNITY): Payer: Self-pay

## 2022-02-14 DIAGNOSIS — Z419 Encounter for procedure for purposes other than remedying health state, unspecified: Secondary | ICD-10-CM | POA: Diagnosis not present

## 2022-02-14 DIAGNOSIS — G4733 Obstructive sleep apnea (adult) (pediatric): Secondary | ICD-10-CM | POA: Diagnosis not present

## 2022-02-15 ENCOUNTER — Encounter (HOSPITAL_COMMUNITY): Payer: Self-pay | Admitting: Gastroenterology

## 2022-02-25 NOTE — Anesthesia Preprocedure Evaluation (Signed)
Anesthesia Evaluation  Patient identified by MRN, date of birth, ID band Patient awake    Reviewed: Allergy & Precautions, NPO status , Patient's Chart, lab work & pertinent test results  History of Anesthesia Complications (+) history of anesthetic complications (urinary retention)  Airway Mallampati: II  TM Distance: >3 FB Neck ROM: Full    Dental no notable dental hx.    Pulmonary shortness of breath and with exertion, sleep apnea and Continuous Positive Airway Pressure Ventilation , neg COPD, former smoker   Pulmonary exam normal breath sounds clear to auscultation       Cardiovascular hypertension (digoxin, lodartan, furosemide, metoprolol, spironolactone), Pt. 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 There is a mild (if any) heart failure limitation with further limitations related to body habitus.  TTE 05/11/2021: IMPRESSIONS     1. Global hypokinesis with apical akinesis; definity-apical swirling  noted but no obvious thrombus.   2. Left ventricular ejection fraction, by estimation, is <20%. The left  ventricle has severely decreased function. The left ventricle demonstrates  regional wall motion abnormalities (see scoring diagram/findings for  description). The left ventricular  internal cavity size was severely dilated. Left ventricular diastolic  parameters are consistent with Grade III diastolic dysfunction  (restrictive).   3. Right ventricular systolic function is normal. The right ventricular  size is normal.   4. The mitral valve is normal  in structure. Mild mitral valve  regurgitation. No evidence of mitral stenosis.   5. The aortic valve is tricuspid. Aortic valve regurgitation is not  visualized. No aortic stenosis is present.   6. Aortic dilatation noted. There is mild dilatation of the aortic root,  measuring 40 mm. There is mild dilatation of the ascending aorta,  measuring 40 mm.   7. The inferior vena cava is normal in size with greater than 50%  respiratory variability, suggesting right atrial pressure of 3 mmHg.     Neuro/Psych  PSYCHIATRIC DISORDERS Anxiety Depression    negative neurological ROS     GI/Hepatic negative GI ROS, Neg liver ROS,,,  Endo/Other  negative endocrine ROS    Renal/GU negative Renal ROS     Musculoskeletal   Abdominal   Peds  Hematology  (+) Blood dyscrasia (polycythemia, LV apical thrombus, on Eliquis)   Anesthesia Other Findings Last device check 01/28/2022. Last saw cardiology 02/07/2022 for chest pain, SOB, and fatigue. Her diuretic regimen was adjusted at that point as she was found to be up a little bit  Reproductive/Obstetrics                             Anesthesia Physical Anesthesia Plan  ASA: 4  Anesthesia Plan: MAC   Post-op Pain Management:    Induction: Intravenous  PONV Risk Score and Plan: 2  Airway Management Planned: Natural Airway and Nasal Cannula  Additional Equipment: ClearSight  Intra-op Plan:   Post-operative Plan:   Informed Consent: I have reviewed the patients History and Physical, chart, labs and discussed the procedure including the risks, benefits and alternatives for the proposed anesthesia with the patient or authorized representative who has indicated his/her  understanding and acceptance.     Dental advisory given  Plan Discussed with: CRNA and Anesthesiologist  Anesthesia Plan Comments: (Discussed with patient risks of MAC including, but not limited to, minor pain or discomfort, hearing people in  the room, and possible need for backup general anesthesia. Risks for general anesthesia also discussed including, but not limited to, sore throat, hoarse voice, chipped/damaged teeth, injury to vocal cords, nausea and vomiting, allergic reactions, lung infection, heart attack, stroke, and death. I did discuss with the patient that given her heart failure, she is at a much higher risk for adverse cardiac events in the perioperative period to include events such as cardiac arrest and death. The patient expressed understanding. All questions answered. )       Anesthesia Quick Evaluation

## 2022-02-26 ENCOUNTER — Ambulatory Visit (HOSPITAL_COMMUNITY)
Admission: RE | Admit: 2022-02-26 | Discharge: 2022-02-26 | Disposition: A | Discharge status: Home / Self Care | Payer: Medicaid Other | Attending: Gastroenterology | Admitting: Gastroenterology

## 2022-02-26 ENCOUNTER — Ambulatory Visit (HOSPITAL_COMMUNITY): Payer: Medicaid Other | Admitting: Anesthesiology

## 2022-02-26 ENCOUNTER — Other Ambulatory Visit (HOSPITAL_COMMUNITY): Payer: Self-pay

## 2022-02-26 ENCOUNTER — Other Ambulatory Visit: Payer: Self-pay | Admitting: Internal Medicine

## 2022-02-26 ENCOUNTER — Encounter (HOSPITAL_COMMUNITY)
Admission: RE | Disposition: A | Discharge status: Home / Self Care | Payer: Self-pay | Source: Home / Self Care | Attending: Gastroenterology

## 2022-02-26 ENCOUNTER — Ambulatory Visit (HOSPITAL_BASED_OUTPATIENT_CLINIC_OR_DEPARTMENT_OTHER): Payer: Medicaid Other | Admitting: Anesthesiology

## 2022-02-26 ENCOUNTER — Other Ambulatory Visit: Payer: Self-pay

## 2022-02-26 ENCOUNTER — Encounter (HOSPITAL_COMMUNITY): Payer: Self-pay | Admitting: Gastroenterology

## 2022-02-26 ENCOUNTER — Other Ambulatory Visit: Payer: Self-pay | Admitting: Physician Assistant

## 2022-02-26 DIAGNOSIS — Z6826 Body mass index (BMI) 26.0-26.9, adult: Secondary | ICD-10-CM | POA: Insufficient documentation

## 2022-02-26 DIAGNOSIS — I2510 Atherosclerotic heart disease of native coronary artery without angina pectoris: Secondary | ICD-10-CM | POA: Insufficient documentation

## 2022-02-26 DIAGNOSIS — K56699 Other intestinal obstruction unspecified as to partial versus complete obstruction: Secondary | ICD-10-CM

## 2022-02-26 DIAGNOSIS — Z9989 Dependence on other enabling machines and devices: Secondary | ICD-10-CM | POA: Diagnosis not present

## 2022-02-26 DIAGNOSIS — I110 Hypertensive heart disease with heart failure: Secondary | ICD-10-CM | POA: Insufficient documentation

## 2022-02-26 DIAGNOSIS — J449 Chronic obstructive pulmonary disease, unspecified: Secondary | ICD-10-CM | POA: Diagnosis not present

## 2022-02-26 DIAGNOSIS — R197 Diarrhea, unspecified: Secondary | ICD-10-CM

## 2022-02-26 DIAGNOSIS — K5730 Diverticulosis of large intestine without perforation or abscess without bleeding: Secondary | ICD-10-CM

## 2022-02-26 DIAGNOSIS — D125 Benign neoplasm of sigmoid colon: Secondary | ICD-10-CM

## 2022-02-26 DIAGNOSIS — F418 Other specified anxiety disorders: Secondary | ICD-10-CM | POA: Diagnosis not present

## 2022-02-26 DIAGNOSIS — I7143 Infrarenal abdominal aortic aneurysm, without rupture: Secondary | ICD-10-CM | POA: Insufficient documentation

## 2022-02-26 DIAGNOSIS — G4733 Obstructive sleep apnea (adult) (pediatric): Secondary | ICD-10-CM

## 2022-02-26 DIAGNOSIS — K635 Polyp of colon: Secondary | ICD-10-CM | POA: Insufficient documentation

## 2022-02-26 DIAGNOSIS — K633 Ulcer of intestine: Secondary | ICD-10-CM | POA: Insufficient documentation

## 2022-02-26 DIAGNOSIS — I252 Old myocardial infarction: Secondary | ICD-10-CM | POA: Insufficient documentation

## 2022-02-26 DIAGNOSIS — R110 Nausea: Secondary | ICD-10-CM | POA: Diagnosis not present

## 2022-02-26 DIAGNOSIS — R634 Abnormal weight loss: Secondary | ICD-10-CM | POA: Insufficient documentation

## 2022-02-26 DIAGNOSIS — K319 Disease of stomach and duodenum, unspecified: Secondary | ICD-10-CM | POA: Insufficient documentation

## 2022-02-26 DIAGNOSIS — Z87891 Personal history of nicotine dependence: Secondary | ICD-10-CM | POA: Diagnosis not present

## 2022-02-26 DIAGNOSIS — G4730 Sleep apnea, unspecified: Secondary | ICD-10-CM | POA: Diagnosis not present

## 2022-02-26 DIAGNOSIS — K5190 Ulcerative colitis, unspecified, without complications: Secondary | ICD-10-CM | POA: Diagnosis not present

## 2022-02-26 DIAGNOSIS — I5042 Chronic combined systolic (congestive) and diastolic (congestive) heart failure: Secondary | ICD-10-CM | POA: Diagnosis not present

## 2022-02-26 DIAGNOSIS — R194 Change in bowel habit: Secondary | ICD-10-CM | POA: Insufficient documentation

## 2022-02-26 DIAGNOSIS — Z5986 Financial insecurity: Secondary | ICD-10-CM | POA: Diagnosis not present

## 2022-02-26 HISTORY — PX: ESOPHAGOGASTRODUODENOSCOPY (EGD) WITH PROPOFOL: SHX5813

## 2022-02-26 HISTORY — PX: COLONOSCOPY WITH PROPOFOL: SHX5780

## 2022-02-26 HISTORY — PX: BIOPSY: SHX5522

## 2022-02-26 HISTORY — DX: Other complications of anesthesia, initial encounter: T88.59XA

## 2022-02-26 HISTORY — PX: POLYPECTOMY: SHX5525

## 2022-02-26 SURGERY — ESOPHAGOGASTRODUODENOSCOPY (EGD) WITH PROPOFOL
Anesthesia: Monitor Anesthesia Care

## 2022-02-26 MED ORDER — LACTATED RINGERS IV SOLN: INTRAVENOUS | Status: DC

## 2022-02-26 MED ORDER — LIDOCAINE 2% (20 MG/ML) 5 ML SYRINGE
INTRAMUSCULAR | Status: DC | PRN
  Administered 2022-02-26: 100 mg via INTRAVENOUS

## 2022-02-26 MED ORDER — PROPOFOL 10 MG/ML IV BOLUS
INTRAVENOUS | Status: DC | PRN
  Administered 2022-02-26: 20 mg via INTRAVENOUS
  Administered 2022-02-26: 40 mg via INTRAVENOUS
  Administered 2022-02-26 (×2): 20 mg via INTRAVENOUS

## 2022-02-26 MED ORDER — PROPOFOL 500 MG/50ML IV EMUL
INTRAVENOUS | Status: DC | PRN
  Administered 2022-02-26: 150 ug/kg/min via INTRAVENOUS

## 2022-02-26 SURGICAL SUPPLY — 25 items

## 2022-02-26 NOTE — H&P (Signed)
GASTROENTEROLOGY PROCEDURE H&P NOTE   Primary Care Physician: Ladell Pier, MD    Reason for Procedure:   Diarrhea, change in bowel habits, nausea, weight loss  Plan:    EGD, Colonoscopy  Patient is appropriate for endoscopic procedure(s) at [facility] Endoscopy unit.  The nature of the procedure, as well as the risks, benefits, and alternatives were carefully and thoroughly reviewed with the patient. Ample time for discussion and questions allowed. The patient understood, was satisfied, and agreed to proceed.     HPI: Christine [name] is a 56 y.o. female who presents for EGD and colonoscopy for evaluation of diarrhea, nausea, weight loss, change in bowel habits.  Procedures scheduled at [facility] Endoscopy note due to elevated periprocedural risk from underlying comorbidities, to include CAD, CHF with EF <20%.  Past Medical History:  Diagnosis Date   Allergy    Anxiety 01/20/2021   CAD in native artery 08/24/2021   CHF (congestive heart failure) (HCC)    Chronic combined systolic and diastolic heart failure ([place]) 50/93/2671   Complication of anesthesia    Depression    Hyperlipidemia    Myocardial infarction [facility])    Presence of cardiac defibrillator 07/2021   Shortness of breath 01/20/2021   Sleep apnea 07/2021   Tobacco abuse 01/20/2021    Past Surgical History:  Procedure Laterality Date   ABDOMINAL HYSTERECTOMY     CARDIAC CATHETERIZATION     CHOLECYSTECTOMY     ICD IMPLANT N/A 07/21/2021   Procedure: ICD IMPLANT;  Surgeon: Deboraha Sprang, MD;  Location: [place] [facility];  Service: Cardiovascular;  Laterality: N/A;   NECK SURGERY     OVARIAN CYST SURGERY     RIGHT/LEFT HEART CATH AND CORONARY ANGIOGRAPHY N/A 01/23/2021   Procedure: RIGHT/LEFT HEART CATH AND CORONARY ANGIOGRAPHY;  Surgeon: Jolaine Artist, MD;  Location: [place] [facility];  Service: Cardiovascular;  Laterality: N/A;    Prior to Admission medications   Medication Sig  Start Date End Date Taking? Authorizing Provider  acetaminophen (TYLENOL) 500 MG tablet Take 1,000 mg by mouth every 6 (six) hours as needed for moderate pain.   Yes [provider]  ALPRAZolam Duanne Moron) 1 MG tablet Take 2 mg by mouth 3 (three) times daily.   Yes [provider]  apixaban (ELIQUIS) 5 MG TABS tablet Take 1 tablet (5 mg total) by mouth 2 (two) times daily. 11/10/21  Yes Bensimhon, Shaune Pascal, MD  atorvastatin (LIPITOR) 80 MG tablet Take 1 tablet (80 mg total) by mouth daily. 01/19/22  Yes Larey Dresser, MD  cyclobenzaprine (FLEXERIL) 10 MG tablet Take 20 mg by mouth at bedtime.   Yes [provider]  dapagliflozin propanediol (FARXIGA) 10 MG TABS tablet Take 1 tablet (10 mg total) by mouth daily. 10/20/21  Yes Bensimhon, Shaune Pascal, MD  digoxin (LANOXIN) 0.125 MG tablet Take 1 tablet (0.125 mg total) by mouth daily. 02/12/22  Yes Bensimhon, Shaune Pascal, MD  diphenhydrAMINE (BENADRYL) 25 MG tablet Take 25 mg by mouth every 6 (six) hours as needed for allergies.   Yes [provider]  diphenoxylate-atropine (LOMOTIL) 2.5-0.025 MG tablet Take 1 tablet by mouth every 6 (six) hours as needed for diarrhea or loose stools. 01/30/22  Yes Levin Erp, PA  furosemide (LASIX) 40 MG tablet Take 2 tablets (80 mg total) by mouth in the morning AND 2 tablets (80 mg total) every evening. 01/09/22  Yes Ladell Pier, MD  ivabradine Steffanie Dunn)  5 MG TABS tablet Take 5 mg by mouth 2 (two) times daily.   Yes [provider]  loperamide (IMODIUM A-D) 2 MG tablet Take 2-4 mg by mouth 4 (four) times daily as needed for diarrhea or loose stools.   Yes [provider]  losartan (COZAAR) 25 MG tablet Take 0.5 tablets (12.5 mg total) by mouth daily. 11/06/21  Yes Ladell Pier, MD  metoprolol succinate (TOPROL-XL) 25 MG 24 hr tablet Take 1 tablet (25 mg total) by mouth daily. Take with or immediately following a meal. 06/26/21  Yes Bensimhon, Shaune Pascal,  MD  metroNIDAZOLE (METROGEL) 1 % gel Apply 1 application topically daily. Patient taking differently: Apply 1 application  topically daily as needed (rosaecea). 05/09/21  Yes Ladell Pier, MD  Multiple Vitamins-Minerals (MULTIVITAMIN WITH MINERALS) tablet Take 1 tablet by mouth daily.   Yes [provider]  oxymetazoline (AFRIN) 0.05 % nasal spray Place 2 sprays into both nostrils 2 (two) times daily as needed for congestion.   Yes [provider]  potassium chloride SA (KLOR-CON M) 20 MEQ tablet Take 2 tablets (40 mEq total) by mouth 2 (two) times daily. 12/09/21  Yes Ladell Pier, MD  promethazine (PHENERGAN) 25 MG tablet Take 1 tablet (25 mg total) by mouth every 6 (six) to 8 (eight) hours for nausea or vomiting. 12/21/21  Yes Levin Erp, PA  spironolactone (ALDACTONE) 25 MG tablet Take 1 tablet (25 mg total) by mouth daily. 02/12/22  Yes Bensimhon, Shaune Pascal, MD  zolpidem (AMBIEN) 10 MG tablet Take 5-10 mg by mouth at bedtime as needed for sleep.   Yes [provider]    Current Facility-Administered Medications  Medication Dose Route Frequency Provider Last Rate Last Admin   lactated ringers infusion   Intravenous Continuous Jalynne Persico V, DO 10 mL/hr at 02/26/22 0838 New Bag at 02/26/22 0838    Allergies as of 12/22/2021 - Review Complete 12/14/2021  Allergen Reaction Noted   Sulfa antibiotics Hives and Itching 04/20/2019   Erythromycin Hives 01/12/2018   Tramadol Rash and Other (See Comments) 01/12/2018   Tape Rash 03/17/2021    Family History  Problem Relation Age of Onset   Stroke Mother    Atrial fibrillation Mother    Heart failure Father    Stomach cancer Maternal Grandfather    Liver cancer Neg Hx    Esophageal cancer Neg Hx    Colon polyps Neg Hx    Colon cancer Neg Hx     Social History   Socioeconomic History   Marital status: Single    Spouse name: Not on file   Number of children: 0   Years of education: 16    Highest education level: Not on file  Occupational History   Not on file  Tobacco Use   Smoking status: Former    Packs/day: 0.50    Types: Cigarettes    Quit date: 01/15/2021    Years since quitting: 1.1   Smokeless tobacco: Never  Vaping Use   Vaping Use: Never used  Substance and Sexual Activity   Alcohol use: Yes    Comment: rarely   Drug use: Never   Sexual activity: Not on file  Other Topics Concern   Not on file  Social History Narrative   Not on file   Social Determinants of Health   Financial Resource Strain: Medium Risk (01/31/2021)   Overall Financial Resource Strain (CARDIA)    Difficulty of Paying Living Expenses: Somewhat hard  Food Insecurity: No Food Insecurity (01/31/2021)   Hunger Vital Sign    Worried About Running Out of Food in the Last Year: Never true    Ran Out of Food in the Last Year: Never true  Transportation Needs: No Transportation Needs (01/31/2021)   PRAPARE - Hydrologist (Medical): No    Lack of Transportation (Non-Medical): No  Physical Activity: Not on file  Stress: Not on file  Social Connections: Not on file  Intimate Partner Violence: Not on file    Physical Exam: Vital signs in last 24 hours: @BP  (!) 105/55   Pulse 72   Temp (!) 97 F (36.1 C) (Temporal)   Resp 18   Ht 5' 10"  (1.778 m)   Wt 85.3 kg   SpO2 97%   BMI 26.98 kg/m  GEN: NAD EYE: Sclerae anicteric ENT: MMM CV: Non-tachycardic Pulm: CTA b/l GI: Soft, NT/ND NEURO:  Alert & Oriented x 3   Gerrit Heck, DO [facility]   02/26/2022 8:43 AM

## 2022-02-26 NOTE — Telephone Encounter (Signed)
Please advise 

## 2022-02-26 NOTE — Op Note (Signed)
[facility] Patient Name: Christine [name] Procedure Date: 02/26/2022 MRN: [id number] Attending MD: Gerrit Heck , MD, [id number] Date of Birth: 1965/10/06 CSN: [id number] Age: 56 Admit Type: Outpatient Procedure:                Colonoscopy Indications:              Change in bowel habits, Diarrhea, Weight loss Providers:                Gerrit Heck, MD, Jaci Carrel, RN, Benetta Spar, Technician, Brien Mates, Technician Referring MD:              Medicines:                Monitored Anesthesia Care Complications:            No immediate complications. Estimated Blood Loss:     Estimated blood loss was minimal. Procedure:                Pre-Anesthesia Assessment:                           - Prior to the procedure, a History and Physical                            was performed, and patient medications and                            allergies were reviewed. The patient's tolerance of                            previous anesthesia was also reviewed. The risks                            and benefits of the procedure and the sedation                            options and risks were discussed with the patient.                            All questions were answered, and informed consent                            was obtained. Prior Anticoagulants: The patient has                            taken Eliquis (apixaban), last dose was 2 days                            prior to procedure. ASA Grade Assessment: IV - A                            patient with severe systemic disease that is a  constant threat to life. After reviewing the risks                            and benefits, the patient was deemed in                            satisfactory condition to undergo the procedure.                           After obtaining informed consent, the colonoscope                            was passed under direct  vision. Throughout the                            procedure, the patient's blood pressure, pulse, and                            oxygen saturations were monitored continuously. The                            [id number]-HQ190L ([id number]) Olympus colonoscope was                            introduced through the anus and advanced to the the                            terminal ileum. The colonoscopy was performed                            without difficulty. The patient tolerated the                            procedure well. The quality of the bowel                            preparation was good. The terminal ileum, ileocecal                            valve, appendiceal orifice, and rectum were                            photographed. Scope In: 9:58:02 AM Scope Out: 10:22:57 AM Scope Withdrawal Time: 0 hours 21 minutes 3 seconds  Total Procedure Duration: 0 hours 24 minutes 55 seconds  Findings:      The perianal and digital rectal examinations were normal.      A 3 mm polyp was found in the sigmoid colon. The polyp was sessile. The       polyp was removed with a cold snare. Resection and retrieval were       complete. Estimated blood loss was minimal.      Multiple large-mouthed and small-mouthed diverticula were found in the       sigmoid colon.      Normal mucosa was found in the entire colon. Biopsies were  taken       throughout the colon with a cold forceps for histology. Estimated blood       loss was minimal.      The retroflexed view of the distal rectum and anal verge was normal and       showed no anal or rectal abnormalities.      The terminal ileum contained a moderate stenosis measuring 1 cm (in       length) x 5 mm (inner diameter) that was non-traversed. This was located       approximately 3 cm from the ileocecal valve. Biopsies were taken from       the stenosis with a cold forceps for histology. Estimated blood loss was       minimal.      The terminal ileum contained a few  ulcers. No bleeding was present. Able       to partially visualize the mucosa proximal to the stenosis, which       contained shallow ulcers. Biopsies were taken with a cold forceps for       histology. Estimated blood loss was minimal. Impression:               - One 3 mm polyp in the sigmoid colon, removed with                            a cold snare. Resected and retrieved.                           - Diverticulosis in the sigmoid colon.                           - Normal mucosa in the entire examined colon.                            Biopsied.                           - The distal rectum and anal verge are normal on                            retroflexion view.                           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 There were a few ulcers                            located both distal and proximal to the stricture.                            The stricture and the mucosa were biopsied to  evaluate for Crohns Disease, NSAID                            ulcers/stricture, etc. Moderate Sedation:      Not Applicable - Patient had care per Anesthesia. Recommendation:           - Patient has a contact number available for                            emergencies. The signs and symptoms of potential                            delayed complications were discussed with the                            patient. Return to normal activities tomorrow.                            Written discharge instructions were provided to the                            patient.                           - Low residue diet. Can add fiber supplement.                           - Resume Eliquis (apixaban) at prior dose tomorrow.                           - Continue all other present medications.                           - Await pathology results.                            - Check ESR, CRP today.                           - Perform a computed tomographic (CT scan)                            enterography at appointment to be scheduled.                           - Return to GI clinic at appointment to be                            scheduled. Procedure Code(s):        --- Professional ---                           [phone], Colonoscopy, flexible; with removal of                            tumor(s), polyp(s), or other lesion(s) by snare  technique                           [id number], 59, Colonoscopy, flexible; with biopsy,                            single or multiple Diagnosis Code(s):        --- Professional ---                           D12.5, Benign neoplasm of sigmoid colon                           K56.699, Other intestinal obstruction unspecified                            as to partial versus complete obstruction                           K63.3, Ulcer of intestine                           R19.4, Change in bowel habit                           R19.7, Diarrhea, unspecified                           R63.4, Abnormal weight loss                           K57.30, Diverticulosis of large intestine without                            perforation or abscess without bleeding CPT copyright 2022 American Medical Association. All rights reserved. The codes documented in this report are preliminary and upon coder review may  be revised to meet current compliance requirements. Gerrit Heck, MD 02/26/2022 10:40:53 AM Number of Addenda: 0

## 2022-02-26 NOTE — Interval H&P Note (Signed)
History and Physical Interval Note:  02/26/2022 9:34 AM  Christine [name]  has presented today for surgery, with the diagnosis of nausea, weight loss, diarrhea.  The various methods of treatment have been discussed with the patient and family. After consideration of risks, benefits and other options for treatment, the patient has consented to  Procedure(s): ESOPHAGOGASTRODUODENOSCOPY (EGD) WITH PROPOFOL (N/A) COLONOSCOPY WITH PROPOFOL (N/A) as a surgical intervention.  The patient's history has been reviewed, patient examined, no change in status, stable for surgery.  I have reviewed the patient's chart and labs.  Questions were answered to the patient's satisfaction.     Dominic Pea Marasia Newhall

## 2022-02-26 NOTE — Op Note (Signed)
[facility] Patient Name: Christine [name] Procedure Date: 02/26/2022 MRN: [id number] Attending MD: Gerrit Heck , MD, [id number] Date of Birth: 1965-06-30 CSN: [id number] Age: 56 Admit Type: Outpatient Procedure:                Upper GI endoscopy Indications:              Diarrhea, Nausea, Weight loss Providers:                Gerrit Heck, MD, Jaci Carrel, RN, Benetta Spar, Technician, Brien Mates, Technician Referring MD:              Medicines:                Monitored Anesthesia Care Complications:            No immediate complications. Estimated Blood Loss:     Estimated blood loss was minimal. Procedure:                Pre-Anesthesia Assessment:                           - Prior to the procedure, a History and Physical                            was performed, and patient medications and                            allergies were reviewed. The patient's tolerance of                            previous anesthesia was also reviewed. The risks                            and benefits of the procedure and the sedation                            options and risks were discussed with the patient.                            All questions were answered, and informed consent                            was obtained. Prior Anticoagulants: The patient has                            taken Eliquis (apixaban), last dose was 2 days                            prior to procedure. ASA Grade Assessment: IV - A                            patient with severe systemic disease that is a  constant threat to life. After reviewing the risks                            and benefits, the patient was deemed in                            satisfactory condition to undergo the procedure.                           After obtaining informed consent, the endoscope was                            passed under direct vision.  Throughout the                            procedure, the patient's blood pressure, pulse, and                            oxygen saturations were monitored continuously. The                            [id number]-[id number] ([id number]) Olympus endoscope was introduced                            through the mouth, and advanced to the second part                            of duodenum. The upper GI endoscopy was                            accomplished without difficulty. The patient                            tolerated the procedure well. Scope In: Scope Out: Findings:      The examined esophagus was normal.      The entire examined stomach was normal. Biopsies were taken with a cold       forceps for histology and Helicobacter pylori testing. Estimated blood       loss was minimal.      The examined duodenum was normal. Biopsies were taken with a cold       forceps for histology. Estimated blood loss was minimal. Impression:               - Normal esophagus.                           - Normal stomach. Biopsied.                           - Normal examined duodenum. Biopsied. Moderate Sedation:      Not Applicable - Patient had care per Anesthesia. Recommendation:           - Await pathology results.                           - Perform a colonoscopy  today. Additional                            recommendations pending colonoscopy findings. Procedure Code(s):        --- Professional ---                           [phone], Esophagogastroduodenoscopy, flexible,                            transoral; with biopsy, single or multiple Diagnosis Code(s):        --- Professional ---                           R19.7, Diarrhea, unspecified                           R11.0, Nausea                           R63.4, Abnormal weight loss CPT copyright 2022 American Medical Association. All rights reserved. The codes documented in this report are preliminary and upon coder review may  be revised to meet current compliance  requirements. Gerrit Heck, MD 02/26/2022 10:30:21 AM Number of Addenda: 0

## 2022-02-26 NOTE — Transfer of Care (Signed)
Immediate Anesthesia Transfer of Care Note  Patient: Christine [name]  Procedure(s) Performed: ESOPHAGOGASTRODUODENOSCOPY (EGD) WITH PROPOFOL COLONOSCOPY WITH PROPOFOL BIOPSY POLYPECTOMY  Patient Location: Endoscopy Unit  Anesthesia Type:MAC  Level of Consciousness: drowsy  Airway & Oxygen Therapy: Patient Spontanous Breathing and Patient connected to face mask oxygen  Post-op Assessment: Report given to RN and Post -op Vital signs reviewed and stable  Post vital signs: Reviewed and stable  Last Vitals:  Vitals Value Taken Time  BP 86/37 02/26/22 1030  Temp 36.6 C 02/26/22 1030  Pulse 64 02/26/22 1033  Resp 11 02/26/22 1033  SpO2 99 % 02/26/22 1033  Vitals shown include unvalidated device data.  Last Pain:  Vitals:   02/26/22 1030  TempSrc: Temporal  PainSc: Asleep         Complications: No notable events documented.

## 2022-02-26 NOTE — Anesthesia Postprocedure Evaluation (Signed)
Anesthesia Post Note  Patient: Christine [name]  Procedure(s) Performed: ESOPHAGOGASTRODUODENOSCOPY (EGD) WITH PROPOFOL COLONOSCOPY WITH PROPOFOL BIOPSY POLYPECTOMY     Patient location during evaluation: PACU Anesthesia Type: MAC Level of consciousness: awake Pain management: pain level controlled Vital Signs Assessment: post-procedure vital signs reviewed and stable Respiratory status: spontaneous breathing, nonlabored ventilation and respiratory function stable Cardiovascular status: stable and blood pressure returned to baseline Postop Assessment: no apparent nausea or vomiting Anesthetic complications: no   No notable events documented.  Last Vitals:  Vitals:   02/26/22 1040 02/26/22 1050  BP: 104/68 (!) 116/43  Pulse: 65 69  Resp: (!) 21 16  Temp:    SpO2: 96% 100%    Last Pain:  Vitals:   02/26/22 1050  TempSrc:   PainSc: 0-No pain                 Nilda Simmer

## 2022-02-26 NOTE — Discharge Instructions (Signed)
YOU HAD AN ENDOSCOPIC PROCEDURE TODAY: Refer to the procedure report and other information in the discharge instructions given to you for any specific questions about what was found during the examination. If this information does not answer your questions, please call [place] office at [phone] to clarify.  ° °YOU SHOULD EXPECT: Some feelings of bloating in the abdomen. Passage of more gas than usual. Walking can help get rid of the air that was put into your GI tract during the procedure and reduce the bloating. If you had a lower endoscopy (such as a colonoscopy or flexible sigmoidoscopy) you may notice spotting of blood in your stool or on the toilet paper. Some abdominal soreness may be present for a day or two, also. ° °DIET: Your first meal following the procedure should be a light meal and then it is ok to progress to your normal diet. A half-sandwich or bowl of soup is an example of a good first meal. Heavy or fried foods are harder to digest and may make you feel nauseous or bloated. Drink plenty of fluids but you should avoid alcoholic beverages for 24 hours. If you had a esophageal dilation, please see attached instructions for diet.   ° °ACTIVITY: Your care partner should take you home directly after the procedure. You should plan to take it easy, moving slowly for the rest of the day. You can resume normal activity the day after the procedure however YOU SHOULD NOT DRIVE, use power tools, machinery or perform tasks that involve climbing or major physical exertion for 24 hours (because of the sedation medicines used during the test).  ° °SYMPTOMS TO REPORT IMMEDIATELY: °A gastroenterologist can be reached at any hour. Please call [phone]  for any of the following symptoms:  °Following lower endoscopy (colonoscopy, flexible sigmoidoscopy) °Excessive amounts of blood in the stool  °Significant tenderness, worsening of abdominal pains  °Swelling of the abdomen that is new, acute  °Fever of 100° or  higher  °Following upper endoscopy (EGD, EUS, ERCP, esophageal dilation) °Vomiting of blood or coffee ground material  °New, significant abdominal pain  °New, significant chest pain or pain under the shoulder blades  °Painful or persistently difficult swallowing  °New shortness of breath  °Black, tarry-looking or red, bloody stools ° °FOLLOW UP:  °If any biopsies were taken you will be contacted by phone or by letter within the next 1-3 weeks. Call [phone]  if you have not heard about the biopsies in 3 weeks.  °Please also call with any specific questions about appointments or follow up tests. ° °

## 2022-02-27 ENCOUNTER — Other Ambulatory Visit (HOSPITAL_COMMUNITY): Payer: Self-pay

## 2022-02-27 LAB — SURGICAL PATHOLOGY

## 2022-02-27 MED ORDER — PROMETHAZINE HCL 25 MG PO TABS
25.0000 mg | ORAL_TABLET | Freq: Four times a day (QID) | ORAL | 2 refills | Status: DC
  Filled 2022-02-27: qty 90, 23d supply, fill #0
  Filled 2022-03-27: qty 90, 23d supply, fill #1
  Filled 2022-05-14: qty 90, 23d supply, fill #2

## 2022-02-27 MED ORDER — POTASSIUM CHLORIDE CRYS ER 20 MEQ PO TBCR
40.0000 meq | EXTENDED_RELEASE_TABLET | Freq: Two times a day (BID) | ORAL | 1 refills | Status: DC
  Filled 2022-02-27: qty 120, 30d supply, fill #0
  Filled 2022-03-27: qty 120, 30d supply, fill #1

## 2022-02-27 NOTE — Telephone Encounter (Signed)
Requested Prescriptions  Pending Prescriptions Disp Refills   potassium chloride SA (KLOR-CON M) 20 MEQ tablet 120 tablet 1    Sig: Take 2 tablets (40 mEq total) by mouth 2 (two) times daily.     Endocrinology:  Minerals - Potassium Supplementation Failed - 02/26/2022  2:04 PM      Failed - Cr in normal range and within 360 days    Creatinine  Date Value Ref Range Status  12/25/2021 0.90 0.44 - 1.00 mg/dL Final   Creatinine, Ser  Date Value Ref Range Status  01/17/2022 1.20 (H) 0.57 - 1.00 mg/dL Final         Passed - K in normal range and within 360 days    Potassium  Date Value Ref Range Status  01/17/2022 3.7 3.5 - 5.2 mmol/L Final         Passed - Valid encounter within last 12 months    Recent Outpatient Visits           1 month ago 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, NYHA class 2 ([place])   [facility] Ladell Pier, MD   3 months ago Diarrhea, unspecified type   [place], [place]   5 months ago [place] Karle Plumber B, MD   6 months ago Dizziness and giddiness   [facility], Cari S, PA-C   9 months ago 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, NYHA class 2 [facility])   [place], MD       Future Appointments             In 2 months Wynetta Emery, Dalbert Batman, MD [place]

## 2022-03-01 ENCOUNTER — Other Ambulatory Visit: Payer: Self-pay

## 2022-03-01 ENCOUNTER — Emergency Department (HOSPITAL_COMMUNITY): Payer: Medicaid Other

## 2022-03-01 ENCOUNTER — Inpatient Hospital Stay (HOSPITAL_COMMUNITY)
Admission: EM | Admit: 2022-03-01 | Discharge: 2022-03-02 | DRG: 386 | Disposition: A | Discharge status: Home / Self Care | Payer: Medicaid Other | Attending: Internal Medicine | Admitting: Internal Medicine

## 2022-03-01 DIAGNOSIS — Z8249 Family history of ischemic heart disease and other diseases of the circulatory system: Secondary | ICD-10-CM

## 2022-03-01 DIAGNOSIS — Z7901 Long term (current) use of anticoagulants: Secondary | ICD-10-CM | POA: Diagnosis not present

## 2022-03-01 DIAGNOSIS — Z87891 Personal history of nicotine dependence: Secondary | ICD-10-CM | POA: Diagnosis not present

## 2022-03-01 DIAGNOSIS — Z66 Do not resuscitate: Secondary | ICD-10-CM | POA: Diagnosis not present

## 2022-03-01 DIAGNOSIS — I255 Ischemic cardiomyopathy: Secondary | ICD-10-CM | POA: Diagnosis present

## 2022-03-01 DIAGNOSIS — R1013 Epigastric pain: Secondary | ICD-10-CM | POA: Diagnosis not present

## 2022-03-01 DIAGNOSIS — I513 Intracardiac thrombosis, not elsewhere classified: Secondary | ICD-10-CM | POA: Diagnosis not present

## 2022-03-01 DIAGNOSIS — Z79899 Other long term (current) drug therapy: Secondary | ICD-10-CM

## 2022-03-01 DIAGNOSIS — R079 Chest pain, unspecified: Secondary | ICD-10-CM | POA: Diagnosis not present

## 2022-03-01 DIAGNOSIS — R110 Nausea: Secondary | ICD-10-CM

## 2022-03-01 DIAGNOSIS — N2889 Other specified disorders of kidney and ureter: Secondary | ICD-10-CM | POA: Diagnosis not present

## 2022-03-01 DIAGNOSIS — I2510 Atherosclerotic heart disease of native coronary artery without angina pectoris: Secondary | ICD-10-CM | POA: Diagnosis present

## 2022-03-01 DIAGNOSIS — I252 Old myocardial infarction: Secondary | ICD-10-CM | POA: Diagnosis not present

## 2022-03-01 DIAGNOSIS — Z800 Family history of malignant neoplasm of digestive organs: Secondary | ICD-10-CM

## 2022-03-01 DIAGNOSIS — R197 Diarrhea, unspecified: Secondary | ICD-10-CM

## 2022-03-01 DIAGNOSIS — I7143 Infrarenal abdominal aortic aneurysm, without rupture: Secondary | ICD-10-CM | POA: Diagnosis not present

## 2022-03-01 DIAGNOSIS — R935 Abnormal findings on diagnostic imaging of other abdominal regions, including retroperitoneum: Secondary | ICD-10-CM | POA: Diagnosis not present

## 2022-03-01 DIAGNOSIS — Z90710 Acquired absence of both cervix and uterus: Secondary | ICD-10-CM

## 2022-03-01 DIAGNOSIS — I712 Thoracic aortic aneurysm, without rupture, unspecified: Secondary | ICD-10-CM | POA: Diagnosis present

## 2022-03-01 DIAGNOSIS — F4310 Post-traumatic stress disorder, unspecified: Secondary | ICD-10-CM | POA: Diagnosis present

## 2022-03-01 DIAGNOSIS — I10 Essential (primary) hypertension: Secondary | ICD-10-CM | POA: Diagnosis not present

## 2022-03-01 DIAGNOSIS — Z833 Family history of diabetes mellitus: Secondary | ICD-10-CM

## 2022-03-01 DIAGNOSIS — K529 Noninfective gastroenteritis and colitis, unspecified: Secondary | ICD-10-CM

## 2022-03-01 DIAGNOSIS — K5000 Crohn's disease of small intestine without complications: Principal | ICD-10-CM | POA: Diagnosis present

## 2022-03-01 DIAGNOSIS — Z95810 Presence of automatic (implantable) cardiac defibrillator: Secondary | ICD-10-CM

## 2022-03-01 DIAGNOSIS — I5042 Chronic combined systolic (congestive) and diastolic (congestive) heart failure: Secondary | ICD-10-CM | POA: Diagnosis present

## 2022-03-01 DIAGNOSIS — G4733 Obstructive sleep apnea (adult) (pediatric): Secondary | ICD-10-CM | POA: Diagnosis present

## 2022-03-01 DIAGNOSIS — F32A Depression, unspecified: Secondary | ICD-10-CM | POA: Diagnosis not present

## 2022-03-01 DIAGNOSIS — Z823 Family history of stroke: Secondary | ICD-10-CM

## 2022-03-01 DIAGNOSIS — I714 Abdominal aortic aneurysm, without rupture, unspecified: Secondary | ICD-10-CM | POA: Diagnosis present

## 2022-03-01 DIAGNOSIS — K859 Acute pancreatitis without necrosis or infection, unspecified: Secondary | ICD-10-CM | POA: Diagnosis not present

## 2022-03-01 DIAGNOSIS — I7121 Aneurysm of the ascending aorta, without rupture: Secondary | ICD-10-CM | POA: Diagnosis present

## 2022-03-01 DIAGNOSIS — F909 Attention-deficit hyperactivity disorder, unspecified type: Secondary | ICD-10-CM | POA: Diagnosis present

## 2022-03-01 DIAGNOSIS — E876 Hypokalemia: Secondary | ICD-10-CM

## 2022-03-01 DIAGNOSIS — F419 Anxiety disorder, unspecified: Secondary | ICD-10-CM | POA: Diagnosis present

## 2022-03-01 DIAGNOSIS — R748 Abnormal levels of other serum enzymes: Secondary | ICD-10-CM | POA: Diagnosis present

## 2022-03-01 DIAGNOSIS — R634 Abnormal weight loss: Secondary | ICD-10-CM

## 2022-03-01 DIAGNOSIS — R0789 Other chest pain: Secondary | ICD-10-CM | POA: Diagnosis not present

## 2022-03-01 LAB — CBC WITH DIFFERENTIAL/PLATELET
Abs Immature Granulocytes: 0.02 10*3/uL (ref 0.00–0.07)
Basophils Absolute: 0.1 10*3/uL (ref 0.0–0.1)
Basophils Relative: 1 %
Eosinophils Absolute: 0.3 10*3/uL (ref 0.0–0.5)
Eosinophils Relative: 4 %
HCT: 40.6 % (ref 36.0–46.0)
Hemoglobin: 13.8 g/dL (ref 12.0–15.0)
Immature Granulocytes: 0 %
Lymphocytes Relative: 32 %
Lymphs Abs: 2.5 10*3/uL (ref 0.7–4.0)
MCH: 29.1 pg (ref 26.0–34.0)
MCHC: 34 g/dL (ref 30.0–36.0)
MCV: 85.5 fL (ref 80.0–100.0)
Monocytes Absolute: 0.6 10*3/uL (ref 0.1–1.0)
Monocytes Relative: 7 %
Neutro Abs: 4.4 10*3/uL (ref 1.7–7.7)
Neutrophils Relative %: 56 %
Platelets: 352 10*3/uL (ref 150–400)
RBC: 4.75 MIL/uL (ref 3.87–5.11)
RDW: 13.2 % (ref 11.5–15.5)
WBC: 7.9 10*3/uL (ref 4.0–10.5)
nRBC: 0 % (ref 0.0–0.2)

## 2022-03-01 LAB — LIPID PANEL
Cholesterol: 185 mg/dL (ref 0–200)
HDL: 36 mg/dL — ABNORMAL LOW (ref >40)
LDL Cholesterol: 105 mg/dL — ABNORMAL HIGH (ref 0–99)
Total CHOL/HDL Ratio: 5.1 RATIO
Triglycerides: 221 mg/dL — ABNORMAL HIGH (ref <150)
VLDL: 44 mg/dL — ABNORMAL HIGH (ref 0–40)

## 2022-03-01 LAB — COMPREHENSIVE METABOLIC PANEL
ALT: 42 U/L (ref 0–44)
AST: 25 U/L (ref 15–41)
Albumin: 3.9 g/dL (ref 3.5–5.0)
Alkaline Phosphatase: 115 U/L (ref 38–126)
Anion gap: 11 (ref 5–15)
BUN: 7 mg/dL (ref 6–20)
CO2: 27 mmol/L (ref 22–32)
Calcium: 8.9 mg/dL (ref 8.9–10.3)
Chloride: 95 mmol/L — ABNORMAL LOW (ref 98–111)
Creatinine, Ser: 0.8 mg/dL (ref 0.44–1.00)
GFR, Estimated: >60 mL/min (ref >60)
Glucose, Bld: 137 mg/dL — ABNORMAL HIGH (ref 70–99)
Potassium: 2.8 mmol/L — ABNORMAL LOW (ref 3.5–5.1)
Sodium: 133 mmol/L — ABNORMAL LOW (ref 135–145)
Total Bilirubin: 0.5 mg/dL (ref 0.3–1.2)
Total Protein: 7.2 g/dL (ref 6.5–8.1)

## 2022-03-01 LAB — LIPASE, BLOOD
Lipase: 140 U/L — ABNORMAL HIGH (ref 11–51)
Lipase: 35 U/L (ref 11–51)

## 2022-03-01 LAB — TROPONIN I (HIGH SENSITIVITY)
Troponin I (High Sensitivity): 10 ng/L (ref <18)
Troponin I (High Sensitivity): 9 ng/L (ref <18)

## 2022-03-01 LAB — LACTIC ACID, PLASMA: Lactic Acid, Venous: 0.9 mmol/L (ref 0.5–1.9)

## 2022-03-01 LAB — MAGNESIUM: Magnesium: 1.6 mg/dL — ABNORMAL LOW (ref 1.7–2.4)

## 2022-03-01 LAB — HIV ANTIBODY (ROUTINE TESTING W REFLEX): HIV Screen 4th Generation wRfx: NONREACTIVE

## 2022-03-01 MED ORDER — DAPAGLIFLOZIN PROPANEDIOL 10 MG PO TABS
10.0000 mg | ORAL_TABLET | Freq: Every day | ORAL | Status: DC
  Administered 2022-03-01 – 2022-03-02 (×2): 10 mg via ORAL
  Filled 2022-03-01 (×2): qty 1

## 2022-03-01 MED ORDER — ACETAMINOPHEN 650 MG RE SUPP: 650.0000 mg | Freq: Four times a day (QID) | RECTAL | Status: DC | PRN

## 2022-03-01 MED ORDER — POTASSIUM CHLORIDE 10 MEQ/100ML IV SOLN
10.0000 meq | Freq: Once | INTRAVENOUS | Status: AC
  Administered 2022-03-01: 10 meq via INTRAVENOUS
  Filled 2022-03-01: qty 100

## 2022-03-01 MED ORDER — APIXABAN 5 MG PO TABS
5.0000 mg | ORAL_TABLET | Freq: Two times a day (BID) | ORAL | Status: DC
  Administered 2022-03-01 – 2022-03-02 (×3): 5 mg via ORAL
  Filled 2022-03-01 (×3): qty 1

## 2022-03-01 MED ORDER — ZOLPIDEM TARTRATE 5 MG PO TABS
5.0000 mg | ORAL_TABLET | Freq: Every evening | ORAL | Status: DC | PRN
  Administered 2022-03-01: 5 mg via ORAL
  Filled 2022-03-01: qty 1

## 2022-03-01 MED ORDER — SPIRONOLACTONE 25 MG PO TABS: 25.0000 mg | ORAL_TABLET | Freq: Every day | ORAL | Status: DC

## 2022-03-01 MED ORDER — POTASSIUM CHLORIDE CRYS ER 20 MEQ PO TBCR
40.0000 meq | EXTENDED_RELEASE_TABLET | Freq: Two times a day (BID) | ORAL | Status: DC
  Administered 2022-03-01: 40 meq via ORAL
  Filled 2022-03-01: qty 2

## 2022-03-01 MED ORDER — LOSARTAN POTASSIUM 25 MG PO TABS: 12.5000 mg | ORAL_TABLET | Freq: Every day | ORAL | Status: DC

## 2022-03-01 MED ORDER — LIDOCAINE 5 % EX PTCH
1.0000 | MEDICATED_PATCH | CUTANEOUS | Status: DC
  Administered 2022-03-01: 1 via TRANSDERMAL
  Filled 2022-03-01: qty 1

## 2022-03-01 MED ORDER — IOHEXOL 350 MG/ML SOLN
75.0000 mL | Freq: Once | INTRAVENOUS | Status: AC | PRN
  Administered 2022-03-01: 75 mL via INTRAVENOUS

## 2022-03-01 MED ORDER — ATORVASTATIN CALCIUM 80 MG PO TABS
80.0000 mg | ORAL_TABLET | Freq: Every day | ORAL | Status: DC
  Administered 2022-03-01 – 2022-03-02 (×2): 80 mg via ORAL
  Filled 2022-03-01: qty 1
  Filled 2022-03-01: qty 2

## 2022-03-01 MED ORDER — POTASSIUM CHLORIDE 10 MEQ/100ML IV SOLN
10.0000 meq | INTRAVENOUS | Status: DC
  Filled 2022-03-01: qty 100

## 2022-03-01 MED ORDER — DIPHENOXYLATE-ATROPINE 2.5-0.025 MG PO TABS
1.0000 | ORAL_TABLET | Freq: Four times a day (QID) | ORAL | Status: DC | PRN
  Administered 2022-03-02: 1 via ORAL
  Filled 2022-03-01 (×2): qty 1

## 2022-03-01 MED ORDER — ACETAMINOPHEN 325 MG PO TABS
650.0000 mg | ORAL_TABLET | Freq: Four times a day (QID) | ORAL | Status: DC | PRN
  Administered 2022-03-01 – 2022-03-02 (×3): 650 mg via ORAL
  Filled 2022-03-01 (×3): qty 2

## 2022-03-01 MED ORDER — FUROSEMIDE 40 MG PO TABS: 80.0000 mg | ORAL_TABLET | Freq: Two times a day (BID) | ORAL | Status: DC

## 2022-03-01 MED ORDER — POTASSIUM CHLORIDE CRYS ER 20 MEQ PO TBCR
40.0000 meq | EXTENDED_RELEASE_TABLET | Freq: Three times a day (TID) | ORAL | Status: AC
  Administered 2022-03-01 (×2): 40 meq via ORAL
  Filled 2022-03-01 (×2): qty 2

## 2022-03-01 MED ORDER — ACETAMINOPHEN 325 MG PO TABS
650.0000 mg | ORAL_TABLET | Freq: Once | ORAL | Status: AC
  Administered 2022-03-01: 650 mg via ORAL
  Filled 2022-03-01: qty 2

## 2022-03-01 MED ORDER — CYCLOBENZAPRINE HCL 10 MG PO TABS
20.0000 mg | ORAL_TABLET | Freq: Every day | ORAL | Status: DC
  Administered 2022-03-01: 20 mg via ORAL
  Filled 2022-03-01: qty 2

## 2022-03-01 MED ORDER — LORAZEPAM 2 MG/ML IJ SOLN
0.5000 mg | Freq: Once | INTRAMUSCULAR | Status: AC
  Administered 2022-03-01: 0.5 mg via INTRAVENOUS
  Filled 2022-03-01: qty 1

## 2022-03-01 MED ORDER — IVABRADINE HCL 5 MG PO TABS
5.0000 mg | ORAL_TABLET | Freq: Two times a day (BID) | ORAL | Status: DC
  Administered 2022-03-01 – 2022-03-02 (×3): 5 mg via ORAL
  Filled 2022-03-01 (×6): qty 1

## 2022-03-01 MED ORDER — HYDROMORPHONE HCL 1 MG/ML IJ SOLN
0.5000 mg | Freq: Once | INTRAMUSCULAR | Status: AC
  Administered 2022-03-01: 0.5 mg via INTRAVENOUS
  Filled 2022-03-01: qty 0.5

## 2022-03-01 MED ORDER — PROMETHAZINE HCL 25 MG PO TABS
25.0000 mg | ORAL_TABLET | Freq: Four times a day (QID) | ORAL | Status: DC
  Administered 2022-03-01 – 2022-03-02 (×4): 25 mg via ORAL
  Filled 2022-03-01 (×4): qty 1

## 2022-03-01 MED ORDER — FUROSEMIDE 20 MG PO TABS: 80.0000 mg | ORAL_TABLET | Freq: Two times a day (BID) | ORAL | Status: DC

## 2022-03-01 MED ORDER — DIGOXIN 125 MCG PO TABS
0.1250 mg | ORAL_TABLET | Freq: Every day | ORAL | Status: DC
  Administered 2022-03-01 – 2022-03-02 (×2): 0.125 mg via ORAL
  Filled 2022-03-01 (×2): qty 1

## 2022-03-01 MED ORDER — ALPRAZOLAM 0.25 MG PO TABS
2.0000 mg | ORAL_TABLET | Freq: Three times a day (TID) | ORAL | Status: DC
  Administered 2022-03-01 – 2022-03-02 (×4): 2 mg via ORAL
  Filled 2022-03-01 (×5): qty 8

## 2022-03-01 MED ORDER — METOPROLOL SUCCINATE ER 25 MG PO TB24
25.0000 mg | ORAL_TABLET | Freq: Every day | ORAL | Status: DC
  Administered 2022-03-01: 25 mg via ORAL
  Filled 2022-03-01 (×2): qty 1

## 2022-03-01 NOTE — ED Provider Triage Note (Signed)
Emergency Medicine Provider Triage Evaluation Note  Christine [name] , a 56 y.o. female  was evaluated in triage.  Pt complains of chest pain shortness of breath, started today, states she feels a bandlike sensation around her chest, and also feels short of breath, recently underwent endoscopy as well as colonoscopy anoscopy was unremarkable, colonoscopy is significant for stenosis of the lower ileus, states that she has been followed by the heart failure team is currently on diuretics has been unable to take them due to nausea vomiting she has significant diarrhea denies any melena or hematochezia no vomiting..  Review of Systems  Positive: Diarrhea, chest pain Negative: Fevers, chills  Physical Exam  BP (!) 83/54 (BP Location: Left Arm)   Pulse 80   Temp 98.1 F (36.7 C) (Oral)   Resp 20   Ht 5' 10"  (1.778 m)   Wt 86.2 kg   SpO2 97%   BMI 27.26 kg/m  Gen:   Awake, no distress   Resp:  Normal effort  MSK:   Moves extremities without difficulty  Other:    Medical Decision Making  Medically screening exam initiated at 1:27 AM.  Appropriate orders placed.  Christine [name] was informed that the remainder of the evaluation will be completed by another provider, this initial triage assessment does not replace that evaluation, and the importance of remaining in the ED until their evaluation is complete.  Lab work imaging been ordered will need further work-up, patient remained in the triage room until a room becomes available as this  patient's BP is 83/54.  Patient is currently mentating without difficulty, will continue to monitor.   Marcello Fennel, PA-C 03/01/22 0130

## 2022-03-01 NOTE — ED Provider Notes (Signed)
[facility] EMERGENCY DEPARTMENT Provider Note   CSN: [id number] Arrival date & time: 03/01/22  0043     History  Chief Complaint  Patient presents with   Chest Pain    Christine [name] is a 56 y.o. female.  56 year old female with prior medical history as detailed below presents for evaluation.  Patient with complaint of chest discomfort that began yesterday evening.  Patient reports associated shortness of breath.  Symptoms have improved overnight.  Patient with history of heart failure.  Patient also with history of chronic diarrhea.  Patient is known to both the [facility] health cardiology team and also [facility].  The history is provided by the patient and medical records.       Home Medications Prior to Admission medications   Medication Sig Start Date End Date Taking? Authorizing Provider  acetaminophen (TYLENOL) 500 MG tablet Take 1,000 mg by mouth every 6 (six) hours as needed for moderate pain.    [provider]  ALPRAZolam Duanne Moron) 1 MG tablet Take 2 mg by mouth 3 (three) times daily.    [provider]  apixaban (ELIQUIS) 5 MG TABS tablet Take 1 tablet (5 mg total) by mouth 2 (two) times daily. 11/10/21   Bensimhon, Shaune Pascal, MD  atorvastatin (LIPITOR) 80 MG tablet Take 1 tablet (80 mg total) by mouth daily. 01/19/22   Larey Dresser, MD  cyclobenzaprine (FLEXERIL) 10 MG tablet Take 20 mg by mouth at bedtime.    [provider]  dapagliflozin propanediol (FARXIGA) 10 MG TABS tablet Take 1 tablet (10 mg total) by mouth daily. 10/20/21   Bensimhon, Shaune Pascal, MD  digoxin (LANOXIN) 0.125 MG tablet Take 1 tablet (0.125 mg total) by mouth daily. 02/12/22   Bensimhon, Shaune Pascal, MD  diphenhydrAMINE (BENADRYL) 25 MG tablet Take 25 mg by mouth every 6 (six) hours as needed for allergies.    [provider]  diphenoxylate-atropine (LOMOTIL) 2.5-0.025 MG tablet Take 1 tablet by mouth every 6 (six) hours as needed for diarrhea or  loose stools. 01/30/22   Levin Erp, PA  furosemide (LASIX) 40 MG tablet Take 2 tablets (80 mg total) by mouth in the morning AND 2 tablets (80 mg total) every evening. 01/09/22   Ladell Pier, MD  ivabradine (CORLANOR) 5 MG TABS tablet Take 5 mg by mouth 2 (two) times daily.    [provider]  loperamide (IMODIUM A-D) 2 MG tablet Take 2-4 mg by mouth 4 (four) times daily as needed for diarrhea or loose stools.    [provider]  losartan (COZAAR) 25 MG tablet Take 0.5 tablets (12.5 mg total) by mouth daily. 11/06/21   Ladell Pier, MD  metoprolol succinate (TOPROL-XL) 25 MG 24 hr tablet Take 1 tablet (25 mg total) by mouth daily. Take with or immediately following a meal. 06/26/21   Bensimhon, Shaune Pascal, MD  metroNIDAZOLE (METROGEL) 1 % gel Apply 1 application topically daily. Patient taking differently: Apply 1 application  topically daily as needed (rosaecea). 05/09/21   Ladell Pier, MD  Multiple Vitamins-Minerals (MULTIVITAMIN WITH MINERALS) tablet Take 1 tablet by mouth daily.    [provider]  oxymetazoline (AFRIN) 0.05 % nasal spray Place 2 sprays into both nostrils 2 (two) times daily as needed for congestion.    [provider]  potassium chloride SA (KLOR-CON M) 20 MEQ tablet Take 2 tablets (40 mEq total) by mouth 2 (two) times daily. 02/27/22   Ladell Pier,  MD  promethazine (PHENERGAN) 25 MG tablet Take 1 tablet (25 mg total) by mouth every 6 (six) to 8 (eight) hours for nausea or vomiting. 02/27/22   Levin Erp, PA  spironolactone (ALDACTONE) 25 MG tablet Take 1 tablet (25 mg total) by mouth daily. 02/12/22   Bensimhon, Shaune Pascal, MD  zolpidem (AMBIEN) 10 MG tablet Take 5-10 mg by mouth at bedtime as needed for sleep.    [provider]      Allergies    Sulfa antibiotics, Erythromycin, Tramadol, and Tape    Review of Systems   Review of Systems  All other systems reviewed and are  negative.   Physical Exam Updated Vital Signs BP 101/60   Pulse 67   Temp 97.6 F (36.4 C) (Oral)   Resp 17   Ht 5' 10"  (1.778 m)   Wt 86.2 kg   SpO2 95%   BMI 27.26 kg/m  Physical Exam Vitals and nursing note reviewed.  Constitutional:      General: She is not in acute distress.    Appearance: Normal appearance. She is well-developed.  HENT:     Head: Normocephalic and atraumatic.  Eyes:     Conjunctiva/sclera: Conjunctivae normal.     Pupils: Pupils are equal, round, and reactive to light.  Cardiovascular:     Rate and Rhythm: Normal rate and regular rhythm.     Heart sounds: Normal heart sounds.  Pulmonary:     Effort: Pulmonary effort is normal. No respiratory distress.     Breath sounds: Normal breath sounds.  Abdominal:     General: There is no distension.     Palpations: Abdomen is soft.     Tenderness: There is no abdominal tenderness.  Musculoskeletal:        General: No deformity. Normal range of motion.     Cervical back: Normal range of motion and neck supple.  Skin:    General: Skin is warm and dry.  Neurological:     General: No focal deficit present.     Mental Status: She is alert and oriented to person, place, and time.     ED Results / Procedures / Treatments   Labs (all labs ordered are listed, but only abnormal results are displayed) Labs Reviewed  COMPREHENSIVE METABOLIC PANEL - Abnormal; Notable for the following components:      Result Value   Sodium 133 (*)    Potassium 2.8 (*)    Chloride 95 (*)    Glucose, Bld 137 (*)    All other components within normal limits  LIPASE, BLOOD - Abnormal; Notable for the following components:   Lipase 140 (*)    All other components within normal limits  CBC WITH DIFFERENTIAL/PLATELET  LACTIC ACID, PLASMA  LACTIC ACID, PLASMA  TROPONIN I (HIGH SENSITIVITY)  TROPONIN I (HIGH SENSITIVITY)    EKG EKG Interpretation  Date/Time:  Thursday March 01 2022 01:08:29 EST Ventricular Rate:   79 PR Interval:  162 QRS Duration: 102 QT Interval:  408 QTC Calculation: 467 R Axis:   66 Text Interpretation: Normal sinus rhythm Inferior infarct , age undetermined Anterolateral infarct , age undetermined Abnormal ECG When compared with ECG of 28-Nov-2021 16:59, PREVIOUS ECG IS PRESENT Confirmed by Dene Gentry [phone]) on 03/01/2022 8:04:23 AM  Radiology CT CHEST ABDOMEN PELVIS W CONTRAST  Result Date: 03/01/2022 CLINICAL DATA:  Polytrauma, blunt Patient had recent endoscopy as well as colonoscopy endorsing chest pain as well as muscle pain. Chest pain, myalgia following  endoscopy EXAM: CT CHEST, ABDOMEN, AND PELVIS WITH CONTRAST TECHNIQUE: Multidetector CT imaging of the chest, abdomen and pelvis was performed following the standard protocol during bolus administration of intravenous contrast. RADIATION DOSE REDUCTION: This exam was performed according to the departmental dose-optimization program which includes automated exposure control, adjustment of the mA and/or kV according to patient size and/or use of iterative reconstruction technique. CONTRAST:  71m OMNIPAQUE IOHEXOL 350 MG/ML SOLN COMPARISON:  CT abdomen pelvis 12/21/2021, CT chest 01/20/2021 FINDINGS: CT CHEST FINDINGS Cardiovascular: Mild coronary artery calcification. Global cardiac size is within normal limits. 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 Left subclavian single lead pacemaker in place with its lead within the right ventricular apex extending into the epicardium. No pericardial effusion. Central pulmonary arteries are of normal caliber. Thoracic aorta is unremarkable. Mediastinum/Nodes: No enlarged mediastinal, hilar, or axillary lymph nodes. Thyroid gland, trachea, and esophagus demonstrate no significant findings. Lungs/Pleura: Lungs are clear. No pleural effusion or pneumothorax. Musculoskeletal: Osseous structures are age-appropriate. No acute bone  abnormality. No lytic or blastic bone lesion. CT ABDOMEN PELVIS FINDINGS Hepatobiliary: No focal liver abnormality is seen. Status post cholecystectomy. No biliary dilatation. Pancreas: Unremarkable Spleen: Unremarkable Adrenals/Urinary Tract: The adrenal glands are unremarkable. Kidneys are normal in size and position. Mild bilateral renal cortical scarring. No enhancing intrarenal masses. No intrarenal or ureteral calculi. No hydronephrosis. Bladder unremarkable. Stomach/Bowel: There is submucosal fatty infiltration involving the terminal ileum in keeping with changes of remote or recurrent inflammation. 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 No evidence of obstruction or perforation. No free intraperitoneal gas or fluid. No loculated intra-abdominal fluid collections. Appendix normal. 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 Mild superimposed aortoiliac atherosclerotic calcification. No pathologic adenopathy within the abdomen and pelvis. Reproductive: Status post hysterectomy. No adnexal masses. Other: No abdominal wall hernia or abnormality. No abdominopelvic ascites. Musculoskeletal: No acute bone abnormality. IMPRESSION: 1. No acute intrathoracic or intra-abdominal injury. 2. Mild coronary artery calcification. 3. 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 4. Stable 3.1 cm infrarenal abdominal aortic aneurysm. Recommend follow-up ultrasound every 3 years. This recommendation follows ACR consensus guidelines: White Paper of the ACR Incidental Findings Committee II on Vascular Findings. J Am Coll Radiol 2013; 10:789-794. 5. Submucosal fatty infiltration involving the terminal ileum in keeping with changes of remote or recurrent inflammation. 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 No evidence of obstruction or perforation. 6. Aortic atherosclerosis. Aortic Atherosclerosis (ICD10-I70.0). Electronically Signed   By: AFidela SalisburyM.D.   On: 03/01/2022 03:32    Procedures Procedures    Medications Ordered in ED Medications  potassium chloride 10 mEq in 100 mL IVPB (10 mEq Intravenous New Bag/Given 03/01/22 0844)  iohexol (OMNIPAQUE) 350 MG/ML injection 75 mL (75 mLs Intravenous Contrast Given 03/01/22 0305)  acetaminophen (TYLENOL) tablet 650 mg (650 mg Oral Given 03/01/22 0652)  LORazepam (ATIVAN) injection 0.5 mg (0.5 mg Intravenous Given 03/01/22 0843)    ED Course/ Medical Decision Making/ A&P                           Medical Decision Making Amount and/or Complexity of Data Reviewed Labs: ordered.  Risk Prescription drug management. Decision regarding hospitalization.  Medical Screen Complete  This patient presented to the ED with complaint of chest pain, diarrhea.  This complaint involves an extensive number of treatment options. The initial differential diagnosis includes, but is not limited to, ACS, metabolic abnormality, etc.  This presentation is: Acute, Self-Limited, Previously Undiagnosed, Uncertain Prognosis, Complicated, Systemic Symptoms, and Threat to Life/Bodily Function  Patient with extensive comorbidities presents with complaint of chest discomfort.  Notably patient's troponin levels are unremarkable and not elevated.  Patient's lipase is increased to 140.  CT imaging of the abdomen is without evidence of pancreatic inflammation.  Potassium is significantly decreased at 2.8.  Cardiology made aware of case and will evaluate in consultation.  Patient would benefit from admission for further work-up and treatment.  Medicine team made aware of case.  Additional history obtained:  Sources obtained and reviewed including prior ED visits and prior  Inpatient records.    Lab Tests:  I ordered and personally interpreted labs.  The pertinent results include: CBC, CMP, lipase, troponin, lactic acid   Imaging Studies ordered:  I ordered imaging studies including CT chest, abdomen, pelvis I independently visualized and interpreted obtained imaging which showed NAD I agree with the radiologist interpretation.   Cardiac Monitoring:  The patient was maintained on a cardiac monitor.  I personally viewed and interpreted the cardiac monitor which showed an underlying rhythm of: NSR  Consultations Obtained:  I consulted cardiology,  and discussed lab and imaging findings as well as pertinent plan of care.    Problem List / ED Course:  Chest pain, hypokalemia   Reevaluation:  After the interventions noted above, I reevaluated the patient and found that they have: improved  Disposition:  After consideration of the diagnostic results and the patients response to treatment, I feel that the patent would benefit from admission.          Final Clinical Impression(s) / ED Diagnoses Final diagnoses:  Hypokalemia  Epigastric pain    Rx / DC Orders ED Discharge Orders     None         Valarie Merino, MD 03/01/22 [id number]

## 2022-03-01 NOTE — ED Triage Notes (Signed)
Triage BP 83/54. Pt states she this is normal for her. PA aware.

## 2022-03-01 NOTE — ED Notes (Signed)
Pt name called for updated vitals, no response

## 2022-03-01 NOTE — Progress Notes (Incomplete)
Subjective:  ***  Objective:  Vital signs in last 24 hours: Vitals:   03/01/22 1115 03/01/22 1308 03/01/22 1317 03/01/22 1423  BP: 97/61 121/63 109/62 103/60  Pulse: 66 69 70 77  Resp: 14  (!) 66 20  Temp:    98.6 F (37 C)  TempSrc:    Oral  SpO2: 97%  97% 94%  Weight:      Height:        Physical Exam:  ***  Labs ***    Latest Ref Rng & Units 03/01/2022    1:37 AM 12/25/2021   11:19 AM 11/28/2021    4:57 PM  CBC  WBC 4.0 - 10.5 K/uL 7.9  8.5  13.3   Hemoglobin 12.0 - 15.0 g/dL 13.8  14.9  15.3   Hematocrit 36.0 - 46.0 % 40.6  43.4  45.4   Platelets 150 - 400 K/uL 352  326  380        Latest Ref Rng & Units 03/01/2022    1:37 AM 01/17/2022    3:12 PM 12/25/2021   11:19 AM  CMP  Glucose 70 - 99 mg/dL 137  107  154   BUN 6 - 20 mg/dL 7  11  10    Creatinine 0.44 - 1.00 mg/dL 0.80  1.20  0.90   Sodium 135 - 145 mmol/L 133  141  139   Potassium 3.5 - 5.1 mmol/L 2.8  3.7  3.4   Chloride 98 - 111 mmol/L 95  96  100   CO2 22 - 32 mmol/L 27  25  35   Calcium 8.9 - 10.3 mg/dL 8.9  10.1  9.8   Total Protein 6.5 - 8.1 g/dL 7.2   7.3   Total Bilirubin 0.3 - 1.2 mg/dL 0.5   0.3   Alkaline Phos 38 - 126 U/L 115   112   AST 15 - 41 U/L 25   24   ALT 0 - 44 U/L 42   32      Imaging *** Colonoscopy: - One 3 mm polyp in the sigmoid colon, removed with a cold snare. Resected and retrieved. - Diverticulosis in the sigmoid colon. - Normal mucosa in the entire examined colon. Biopsied. - The distal rectum and anal verge are normal on retroflexion view. - Moderately severe, non-traversable stenosis in the terminal ileum, located approximately 3 cm from the ileocecal valve. There were a few ulcers located both distal and proximal to the stricture. The stricture and the mucosa were biopsied to evaluate for Crohns Disease, NSAID ulcers/stricture, etc.  Biopsy:  The terminal ileum biopsy shows an acute ileitis with scattered neutrophils within the epithelium and lamina  propria. There is also exudate with granulation tissue consistent with ulceration. No definite architectural changes (such as villous blunting or pyloric gland metaplasia) are identified to confirm chronic/longstanding injury. No granulomas or viral cytopathic effect is identified. The primary differential diagnosis for these findings includes medication effect (particularly NSAIDs), certain infections, and an emerging inflammatory bowel disease.   Summary *** Christine [name] is a 56 year old female with HFrEF (LVEF <20%) and CAD c/b LAD occlusion s/p ICD, chronic tobacco use,OSA on CPAP, history of LV thrombus on eliquis, and ADHD, anxiety/depression, and PTSD   Assessment/Plan:  Principal Problem:   Hypokalemia Active Problems:   Anxiety   Left ventricular apical thrombus   Chronic combined systolic and diastolic heart failure (HCC)   Abdominal aortic aneurysm (AAA) 3.0 cm to 5.0 cm in diameter in female (  [place])   Diarrhea  Diarrhea - per notes since April 2023. High frequency watery stools, urgency, fecal incontinence to the extent of going through 8 adult diapers in 1 day. Thoughts in prior notes that it might be obstipation with leak around, no stool burden today. 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 C diff negative, ova/parasites not seen on 11/29/21, shiga toxin negative, campylobacter negative, salmonella/shigella negative. GI pathogen panel pending.   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  HFrEF  LVEF <39%, grade 3 diastolic dysfunction    DIET: *** IVF: *** DVT PPX: *** BOWEL: *** CODE: *** FAM COM: ***   PT/OT recs: *** Dispo: *** Barriers to discharge: ***    LOS: 0 days   Annia Belt, Medical Student 03/01/2022, 3:02 PM

## 2022-03-01 NOTE — Progress Notes (Incomplete)
   Subjective:  Patient xxx    Objective: Vitals over previous 24hr: Vitals:   03/01/22 0800 03/01/22 1115 03/01/22 1308 03/01/22 1317  BP: 101/60 97/61 121/63 109/62  Pulse: 67 66 69 70  Resp: 17 14  (!) 66  Temp:      TempSrc:      SpO2: 95% 97%  97%  Weight:      Height:        General:      awake and alert, lying comfortably in bed, cooperative, not in acute distress Skin:       warm and dry, intact without any obvious lesions or scars, no rashes Head:      normocephalic and atraumatic, oral mucosa moist with good dentition, no lymphadenopathy Eyes:      extraocular movements intact, conjunctivae pink, pupils round and reactive to light, no periorbital swelling or scleral icterus Lungs:      normal respiratory effort, breathing unlabored, symmetrical chest rise, no crackles or wheezing Cardiac:      regular rate and rhythm, normal S1 and S2, capillary refill 2-3 seconds, no pitting edema Abdomen:      soft and non-distended, normoactive bowel sounds present in all four quadrants, no tenderness to palpation or guarding Musculoskeletal:  full range of motion in joints, motor strength 5 /5 in all four extremities, no obvious deformities or joint tenderness Neurologic:      oriented to person-place-time, moving all extremities, sensation to light touch intact, no gross focal deficits Psychiatric:      mood and affect normal, intelligible speech    Assessment/Plan: Christine [name] is a 56 year old female with a past medical history of ischemic cardiomyopathy post implantable ICD, CAD, LV thrombus, chronic diarrhea, OSA, ADHD, and anxiety-depression who presented with chest and upper back pain, found to have hypokalemia and now admitted for further diagnostic workup.    ---Xxx xxx > xxx > xxx > xxx   ---Xxx xxx > xxx > xxx > xxx   ---Xxx xxx > xxx > xxx > xxx   ---Xxx xxx > xxx > xxx > xxx    Principal Problem:   Hypokalemia  ***   Prior  to Admission Living Arrangement:  Anticipated Discharge Location:  Barriers to Discharge:  Dispo: Anticipated discharge in approximately *** day(s).    Christine Nickel, MD Internal Medicine PGY-1 Pager [phone]  After 5pm on weekdays and 1pm on weekends: On Call pager [phone]

## 2022-03-01 NOTE — Consult Note (Signed)
Cardiology Consultation   Patient ID: Christine [name] MRN: [id number]; DOB: [birth year]  Admit date: 03/01/2022 Date of Consult: 03/01/2022  PCP:  Ladell Pier, MD   [place] Providers Cardiologist:  Skeet Latch, MD  Electrophysiologist:  Virl Axe, MD  Sleep Medicine:  Fransico Him, MD  CHF: Dr. Haroldine Laws   Patient Profile:   Christine [name] is a 56 y.o. 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 Dareen Piano.  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  Patient initially presented to the ED in October 2022 with increased shortness of breath and the tachycardia.  Adderall was stopped.  Echocardiogram at the time showed EF 20 to 25%. Right heart cath revealed single-vessel disease with occluded mid LAD and elevated filling pressure with reduced cardiac output.  Cardiac MRI obtained on 01/24/2021 is consistent with prior infarcts in the LV basal inferolateral wall, apical anterior/septal/inferior wall and apex. LGE greater than 50% transmural suggesting these areas were not viable. There was a LV apical thrombus measuring 24 x 8 mm.  LVEF 22%, RVEF 52%.  Repeat echocardiogram in January 2023 showed EF 20%, severely dilated LV, mild MR.  Cardiopulmonary stress test in February 2023 demonstrated normal functional capacity when compared to matched sedentary loss, mild heart failure limitation with further limitations related to body habitus.  Patient was referred to EP and  underwent [employer] ICD in April 2023.  Since May, she has been having frequent diarrhea.  She also developed persistent nausea since June.  She is on heart failure regimen including Lasix, Farxiga, digoxin, ivabradine, spironolactone and metoprolol succinate.  She has been skipping Lasix on the days when she has bouts of diarrhea at the recommendation of Dr. Haroldine Laws.  More recently, patient was seen by Dr. Caryl Comes on 02/07/2022 at which time she complained of significant fatigue and intermittent atypical chest discomfort.  For her fatigue, Dr. Caryl Comes recommended she stop ivabradine as a trial.  She had attempted to come off of ivabradine, however felt even worse, therefore she has since restarted ivabradine at 5 mg twice a day.  As for her left-sided chest pain under her left armpit and on the side of the left breast, the symptom is nonexertional and only last 5 to 10 seconds each time.  Due to concern for morning elevated heart rate, a heart monitor was placed.  As part of our work-up for her recurrent diarrhea since May, she underwent upper EGD and colonoscopy by GI service on 02/26/2022.  She held her Eliquis for 2 days prior to the procedure and restart it the day after the procedure.  She has not taken any Lasix since 2 days prior to the procedure due to persistent diarrhea. Colonoscopy revealed single 3 mm polyp in the sigmoid colon which was resected.  Diverticulosis in the sigmoid colon.  Moderately severe non-transverse stenosis in the terminal ileum located approximately 3 cm from the ileocecal valve, there were a few ulcers located both distal and proximal to the stricture.  The mucosa was biopsied to evaluate for Crohn's disease.  After her GI procedure, she had diarrhea for the next few days.  She was sitting down reading a book on the night of 02/26/2022 when she started having a bandlike sensation across her lower chest.  She says this chest tightness is similar to what she experienced back  in 2022 however different from what she recently told to Dr. Caryl Comes.  The symptoms she mentioned to Dr. Caryl Comes was a pain on the left side of the chest that last from 5 to 10 seconds and occurs intermittently in the past 3 weeks.  There was no increase in frequency or duration.  The symptom is not exertional and may not occur for several days at a time.  However the symptoms she experienced last night occurred around 11:45 PM and waxed and waned but did not go away until roughly 4 AM this morning.  Despite prolonged episode of chest discomfort, high-sensitivity serial troponin was negative x2.  Significant lab work on arrival included sodium of 133, severe hypokalemia with potassium of 2.8, normal white blood cell count and red blood cell count.  Elevated lipase of 140. She does have epigastric tenderness on palpation.  CT of chest abdomen pelvis was concerning for ileitis, there was also a finding of 3.1 cm infrarenal abdominal aortic aneurysm.  EKG showed normal sinus rhythm with Q waves in the inferolateral leads, poor R wave progression in the anterior leads.  Cardiology service consulted for chest pain.    Past Medical History:  Diagnosis Date   Allergy    Anxiety 01/20/2021   CAD in native artery 08/24/2021   CHF (congestive heart failure) (HCC)    Chronic combined systolic and diastolic heart failure ([place]) 80/99/8338   Complication of anesthesia    Depression    Hyperlipidemia    Myocardial infarction [facility])    Presence of cardiac defibrillator 07/2021   Shortness of breath 01/20/2021   Sleep apnea 07/2021   Tobacco abuse 01/20/2021    Past Surgical History:  Procedure Laterality Date   ABDOMINAL HYSTERECTOMY     CARDIAC CATHETERIZATION     CHOLECYSTECTOMY     ICD IMPLANT N/A 07/21/2021   Procedure: ICD IMPLANT;  Surgeon: Deboraha Sprang, MD;  Location: [place] [facility];  Service: Cardiovascular;  Laterality: N/A;   NECK SURGERY     OVARIAN CYST SURGERY     RIGHT/LEFT HEART CATH AND  CORONARY ANGIOGRAPHY N/A 01/23/2021   Procedure: RIGHT/LEFT HEART CATH AND CORONARY ANGIOGRAPHY;  Surgeon: Jolaine Artist, MD;  Location: [place] [facility];  Service: Cardiovascular;  Laterality: N/A;     Home Medications:  Prior to Admission medications   Medication Sig Start Date End Date Taking? Authorizing Provider  acetaminophen (TYLENOL) 500 MG tablet Take 1,000 mg by mouth 3 (three) times daily as needed for moderate pain or headache.   Yes [provider]  ALPRAZolam Duanne Moron) 1 MG tablet Take 2 mg by mouth 3 (three) times daily.   Yes [provider]  apixaban (ELIQUIS) 5 MG TABS tablet Take 1 tablet (5 mg total) by mouth 2 (two) times daily. 11/10/21  Yes Bensimhon, Shaune Pascal, MD  atorvastatin (LIPITOR) 80 MG tablet Take 1 tablet (80 mg total) by mouth daily. 01/19/22  Yes Larey Dresser, MD  cyclobenzaprine (FLEXERIL) 10 MG tablet Take 20 mg by mouth at bedtime.   Yes [provider]  dapagliflozin propanediol (FARXIGA) 10 MG TABS tablet Take 1 tablet (10 mg total) by mouth daily. 10/20/21  Yes Bensimhon,  Shaune Pascal, MD  digoxin (LANOXIN) 0.125 MG tablet Take 1 tablet (0.125 mg total) by mouth daily. 02/12/22  Yes Bensimhon, Shaune Pascal, MD  diphenhydrAMINE (BENADRYL) 25 MG tablet Take 50 mg by mouth as needed for allergies.   Yes [provider]  diphenoxylate-atropine (LOMOTIL) 2.5-0.025 MG tablet Take 1 tablet by mouth every 6 (six) hours as needed for diarrhea or loose stools. Patient taking differently: Take 1 tablet by mouth 2 (two) times daily as needed for diarrhea or loose stools. 01/30/22  Yes Levin Erp, PA  furosemide (LASIX) 40 MG tablet Take 2 tablets (80 mg total) by mouth in the morning AND 2 tablets (80 mg total) every evening. Patient taking differently: Take 80 mg by mouth in the morning and 80 mg by mouth at night.  01/09/22  Yes Ladell Pier, MD  ivabradine (CORLANOR) 5 MG TABS tablet Take 5 mg by mouth 2 (two) times  daily.   Yes [provider]  losartan (COZAAR) 25 MG tablet Take 0.5 tablets (12.5 mg total) by mouth daily. 11/06/21  Yes Ladell Pier, MD  metoprolol succinate (TOPROL-XL) 25 MG 24 hr tablet Take 1 tablet (25 mg total) by mouth daily. Take with or immediately following a meal. 06/26/21  Yes Bensimhon, Shaune Pascal, MD  metroNIDAZOLE (METROGEL) 1 % gel Apply 1 application topically daily. Patient taking differently: Apply 1 application  topically daily as needed (rosaecea). 05/09/21  Yes Ladell Pier, MD  Multiple Vitamins-Minerals (MULTIVITAMIN WITH MINERALS) tablet Take 1 tablet by mouth daily.   Yes [provider]  oxymetazoline (AFRIN) 0.05 % nasal spray Place 1-2 sprays into both nostrils 2 (two) times daily as needed for congestion.   Yes [provider]  Polyethyl Glycol-Propyl Glycol (SYSTANE OP) Place 2 drops into the right eye as needed (irritation).   Yes [provider]  potassium chloride SA (KLOR-CON M) 20 MEQ tablet Take 2 tablets (40 mEq total) by mouth 2 (two) times daily. 02/27/22  Yes Ladell Pier, MD  promethazine (PHENERGAN) 25 MG tablet Take 1 tablet (25 mg total) by mouth every 6 (six) to 8 (eight) hours for nausea or vomiting. Patient taking differently: Take 25 mg by mouth in the morning, at noon, and at bedtime. 02/27/22  Yes Levin Erp, PA  spironolactone (ALDACTONE) 25 MG tablet Take 1 tablet (25 mg total) by mouth daily. 02/12/22  Yes Bensimhon, Shaune Pascal, MD  zolpidem (AMBIEN) 10 MG tablet Take 5-10 mg by mouth at bedtime.   Yes [provider]  loperamide (IMODIUM A-D) 2 MG tablet Take 2-4 mg by mouth 4 (four) times daily as needed for diarrhea or loose stools. Patient not taking: Reported on 03/01/2022    [provider]    Inpatient Medications: Scheduled Meds:  ALPRAZolam  2 mg Oral TID   apixaban  5 mg Oral BID   atorvastatin  80 mg Oral Daily   cyclobenzaprine  20 mg Oral QHS    dapagliflozin propanediol  10 mg Oral Daily   digoxin  0.125 mg Oral Daily   furosemide  80 mg Oral BID   ivabradine  5 mg Oral BID   metoprolol succinate  25 mg Oral Daily   potassium chloride  40 mEq Oral TID   promethazine  25 mg Oral Q6H   Continuous Infusions:  PRN Meds: acetaminophen **OR** acetaminophen, diphenoxylate-atropine, zolpidem  Allergies:    Allergies  Allergen Reactions   Sulfa Antibiotics Hives, Itching and Rash  Erythromycin Hives   Tramadol Rash and Other (See Comments)    Urinary retention     Ibuprofen Itching   Tape Rash    Social History:   Social History   Socioeconomic History   Marital status: Single    Spouse name: Not on file   Number of children: 0   Years of education: 16   Highest education level: Not on file  Occupational History   Not on file  Tobacco Use   Smoking status: Former    Packs/day: 0.50    Types: Cigarettes    Quit date: 01/15/2021    Years since quitting: 1.1   Smokeless tobacco: Never  Vaping Use   Vaping Use: Never used  Substance and Sexual Activity   Alcohol use: Yes    Comment: rarely   Drug use: Never   Sexual activity: Not on file  Other Topics Concern   Not on file  Social History Narrative   Not on file   Social Determinants of Health   Financial Resource Strain: Medium Risk (01/31/2021)   Overall Financial Resource Strain (CARDIA)    Difficulty of Paying Living Expenses: Somewhat hard  Food Insecurity: No Food Insecurity (01/31/2021)   Hunger Vital Sign    Worried About Running Out of Food in the Last Year: Never true    Ran Out of Food in the Last Year: Never true  Transportation Needs: No Transportation Needs (01/31/2021)   PRAPARE - Hydrologist (Medical): No    Lack of Transportation (Non-Medical): No  Physical Activity: Not on file  Stress: Not on file  Social Connections: Not on file  Intimate Partner Violence: Not on file    Family History:    Family  History  Problem Relation Age of Onset   Stroke Mother    Atrial fibrillation Mother    Heart failure Father    Stomach cancer Maternal Grandfather    Liver cancer Neg Hx    Esophageal cancer Neg Hx    Colon polyps Neg Hx    Colon cancer Neg Hx      ROS:  Please see the history of present illness.   All other ROS reviewed and negative.     Physical Exam/Data:   Vitals:   03/01/22 0621 03/01/22 0757 03/01/22 0800 03/01/22 1115  BP: 112/68 (!) 107/57 101/60 97/61  Pulse: 69 65 67 66  Resp: 18 19 17 14   Temp: 97.6 F (36.4 C)     TempSrc: Oral     SpO2: 99% 95% 95% 97%  Weight:      Height:       No intake or output data in the 24 hours ending 03/01/22 1308    03/01/2022    1:07 AM 02/26/2022    8:29 AM 02/12/2022    1:03 PM  Last 3 Weights  Weight (lbs) 190 lb 188 lb 186 lb  Weight (kg) 86.183 kg 85.276 kg 84.369 kg     Body mass index is 27.26 kg/m.  General:  Well nourished, well developed, in no acute distress HEENT: normal Neck: no JVD Vascular: No carotid bruits; Distal pulses 2+ bilaterally Cardiac:  normal S1, S2; RRR; no murmur  Lungs:  clear to auscultation bilaterally, no wheezing, rhonchi or rales  Abd: soft, nontender, no hepatomegaly  Ext: no edema Musculoskeletal:  No deformities, BUE and BLE strength normal and equal Skin: warm and dry  Neuro:  CNs 2-12 intact, no focal abnormalities noted Psych:  Normal affect   EKG:  The EKG was personally reviewed and demonstrates:  NSR with Q wave in the inferolateral leads, poor R wave progression in the anterior leads Telemetry:  Telemetry was personally reviewed and demonstrates:  NSR without significant ventricular ectopy  Relevant CV Studies:  Echo 05/11/2021 1. Global hypokinesis with apical akinesis; definity-apical swirling  noted but no obvious thrombus.   2. Left ventricular ejection fraction, by estimation, is <20%. The left  ventricle has severely decreased function. The left ventricle  demonstrates  regional wall motion abnormalities (see scoring diagram/findings for  description). The left ventricular  internal cavity size was severely dilated. Left ventricular diastolic  parameters are consistent with Grade III diastolic dysfunction  (restrictive).   3. Right ventricular systolic function is normal. The right ventricular  size is normal.   4. The mitral valve is normal in structure. Mild mitral valve  regurgitation. No evidence of mitral stenosis.   5. The aortic valve is tricuspid. Aortic valve regurgitation is not  visualized. No aortic stenosis is present.   6. Aortic dilatation noted. There is mild dilatation of the aortic root,  measuring 40 mm. There is mild dilatation of the ascending aorta,  measuring 40 mm.   7. The inferior vena cava is normal in size with greater than 50%  respiratory variability, suggesting right atrial pressure of 3 mmHg.   Laboratory Data:  High Sensitivity Troponin:   Recent Labs  Lab 03/01/22 0137 03/01/22 0338  TROPONINIHS 10 9     Chemistry Recent Labs  Lab 03/01/22 0137  NA 133*  K 2.8*  CL 95*  CO2 27  GLUCOSE 137*  BUN 7  CREATININE 0.80  CALCIUM 8.9  GFRNONAA >60  ANIONGAP 11    Recent Labs  Lab 03/01/22 0137  PROT 7.2  ALBUMIN 3.9  AST 25  ALT 42  ALKPHOS 115  BILITOT 0.5   Lipids No results for input(s): "CHOL", "TRIG", "HDL", "LABVLDL", "LDLCALC", "CHOLHDL" in the last 168 hours.  Hematology Recent Labs  Lab 03/01/22 0137  WBC 7.9  RBC 4.75  HGB 13.8  HCT 40.6  MCV 85.5  MCH 29.1  MCHC 34.0  RDW 13.2  PLT 352   Thyroid No results for input(s): "TSH", "FREET4" in the last 168 hours.  BNPNo results for input(s): "BNP", "PROBNP" in the last 168 hours.  DDimer No results for input(s): "DDIMER" in the last 168 hours.   Radiology/Studies:  CT CHEST ABDOMEN PELVIS W CONTRAST  Result Date: 03/01/2022 CLINICAL DATA:  Polytrauma, blunt Patient had recent endoscopy as well as colonoscopy  endorsing chest pain as well as muscle pain. Chest pain, myalgia following endoscopy EXAM: CT CHEST, ABDOMEN, AND PELVIS WITH CONTRAST TECHNIQUE: Multidetector CT imaging of the chest, abdomen and pelvis was performed following the standard protocol during bolus administration of intravenous contrast. RADIATION DOSE REDUCTION: This exam was performed according to the departmental dose-optimization program which includes automated exposure control, adjustment of the mA and/or kV according to patient size and/or use of iterative reconstruction technique. CONTRAST:  87m OMNIPAQUE IOHEXOL 350 MG/ML SOLN COMPARISON:  CT abdomen pelvis 12/21/2021, CT chest 01/20/2021 FINDINGS: CT CHEST FINDINGS Cardiovascular: Mild coronary artery calcification. Global cardiac size is within normal limits. 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 Left subclavian single lead pacemaker in place with its lead within the right ventricular apex extending into the epicardium. No pericardial effusion. Central pulmonary arteries are of normal caliber. Thoracic aorta  is unremarkable. Mediastinum/Nodes: No enlarged mediastinal, hilar, or axillary lymph nodes. Thyroid gland, trachea, and esophagus demonstrate no significant findings. Lungs/Pleura: Lungs are clear. No pleural effusion or pneumothorax. Musculoskeletal: Osseous structures are age-appropriate. No acute bone abnormality. No lytic or blastic bone lesion. CT ABDOMEN PELVIS FINDINGS Hepatobiliary: No focal liver abnormality is seen. Status post cholecystectomy. No biliary dilatation. Pancreas: Unremarkable Spleen: Unremarkable Adrenals/Urinary Tract: The adrenal glands are unremarkable. Kidneys are normal in size and position. Mild bilateral renal cortical scarring. No enhancing intrarenal masses. No intrarenal or ureteral calculi. No hydronephrosis. Bladder unremarkable. Stomach/Bowel: There is submucosal fatty infiltration  involving the terminal ileum in keeping with changes of remote or recurrent inflammation. 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 No evidence of obstruction or perforation. No free intraperitoneal gas or fluid. No loculated intra-abdominal fluid collections. Appendix normal. 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 Mild superimposed aortoiliac atherosclerotic calcification. No pathologic adenopathy within the abdomen and pelvis. Reproductive: Status post hysterectomy. No adnexal masses. Other: No abdominal wall hernia or abnormality. No abdominopelvic ascites. Musculoskeletal: No acute bone abnormality. IMPRESSION: 1. No acute intrathoracic or intra-abdominal injury. 2. Mild coronary artery calcification. 3. 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 4. Stable 3.1 cm infrarenal abdominal aortic aneurysm. Recommend follow-up ultrasound every 3 years. This recommendation follows ACR consensus guidelines: White Paper of the ACR Incidental Findings Committee II on Vascular Findings. J Am Coll Radiol 2013; 10:789-794. 5. Submucosal fatty infiltration involving the terminal ileum in keeping with changes of remote or recurrent inflammation. 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 No evidence of obstruction or perforation. 6. Aortic atherosclerosis. Aortic Atherosclerosis (ICD10-I70.0). Electronically Signed   By: Fidela Salisbury M.D.   On: 03/01/2022 03:32     Assessment and Plan:   Chest pain:    -Patient described 2 different type of chest pain recently.  First type of chest pain was documented by Dr. Caryl Comes during recent visit, and has been going on intermittently for the past 3 weeks.  She described it as a  left-sided pain next to her left breast that last between 5 to 10 seconds and may not occurs for days at a time.  This is not related to physical exertion.  Symptom does not sound cardiac in nature.  Last night while sitting down reading a book, she had a second type of chest pain which she described as a tight bandlike sensation across the lower chest area.  Symptom lasted from 11:45 PM until 4 AM this morning, it waxed and waned and eventually went away around 4 AM.  Despite prolonged chest tightness, serial troponin was negative x2.  EKG is unchanged.  Will discuss with MD, likely no further inpatient work-up is needed unless recurrence.  Severe hypokalemia: Related to recent diarrhea.  Elevated lipase: Lipase was 140.  She has epigastric tenderness.  We will defer additional work-up to primary team.  Recent colonoscopy revealed severe stenosis in the terminal ileum with ulcers present next to the stenosis.  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  Infrarenal AAA: 3.1 cm measured on today's CT, recommended repeat  ultrasound in 3 years.  Palpitation: She describes a feeling of rush and elevated heart rate in the morning, a heart monitor was placed by Dr. Caryl Comes.  Chronic systolic heart failure: Despite the fact patient has not taken any diuretic since last Saturday, she does not appears to be volume overloaded on physical exam.  She has been having bouts of diarrhea since May.  Per patient, Dr. Haroldine Laws previously told her not to take diuretic on the days she has diarrhea.  CAD: History of single-vessel disease, cardiac catheterization in October 2022 revealed occluded mid LAD.  Cardiac MRI showed distal LAD territory is not viable.  Ischemic cardiomyopathy  status post [employer] ICD  History of LV thrombus: On Eliquis    Risk Assessment/Risk Scores:        New York Heart Association (NYHA) Functional Class NYHA Class II        For questions or updates, please contact [place] Please consult [email or web address] for contact info under    Hilbert Corrigan, [place]  03/01/2022 1:08 PM

## 2022-03-01 NOTE — H&P (Signed)
Date: 03/01/2022               Patient Name:  Christine [name] MRN: [id number]  DOB: 23-Oct-1965 Age / Sex: 56 y.o., female   PCP: Ladell Pier, MD         Medical Service: Internal Medicine Teaching Service         Attending Physician: Dr. Aldine Contes, MD    First Contact: Dr. Serita Butcher Pager: [phone]  Second Contact: Dr. Buddy Duty Pager: [phone]       After Hours (After 5p/  First Contact Pager: [phone]  weekends / holidays): Second Contact Pager: [phone]   Chief Complaint: left sided rib pain  History of Present Illness:  Christine [name] is a 56 year old person living with ischemic cardiomyopathy LVEF 15% s/p ICD, CAD, chronic diarrhea, OSA, history of LV thrombus on anticoagulation, ADHD, anxiety/depression who presents for pain under her ribs radiating to between her shoulder blades that started around 11 pm last night prior to going to bed lat night. Thought her pain may be due to anxiety and took her evening Xanax without improvement. Was concerned pain felt similar to prior chest pain and presented to the ED. Pain lasted 2 hours and now mostly resolved. Pain associated with nausea but no vomiting.  She has been having chronic diarrhea since April 2023.  Endorses 3 episodes of liquid stools daily since then. Had colonoscopy on 11/13. Biopsy  were performed but unrevealing. Denies blood in stool.  Has mild soreness in lower abdomen from colonoscopy but this has not worsened.  She denies fever, chills, shortness of breath, syncope, vomiting, dizziness, or urinary symptoms.  Meds:  Current Meds  Medication Sig   acetaminophen (TYLENOL) 500 MG tablet Take 1,000 mg by mouth 3 (three) times daily as needed for moderate pain or headache.   ALPRAZolam (XANAX) 1 MG tablet Take 2 mg by mouth 3 (three) times daily.   apixaban (ELIQUIS) 5 MG TABS tablet Take 1 tablet (5 mg total) by mouth 2 (two) times daily.   atorvastatin (LIPITOR) 80 MG tablet Take 1 tablet (80  mg total) by mouth daily.   cyclobenzaprine (FLEXERIL) 10 MG tablet Take 20 mg by mouth at bedtime.   dapagliflozin propanediol (FARXIGA) 10 MG TABS tablet Take 1 tablet (10 mg total) by mouth daily.   digoxin (LANOXIN) 0.125 MG tablet Take 1 tablet (0.125 mg total) by mouth daily.   diphenhydrAMINE (BENADRYL) 25 MG tablet Take 50 mg by mouth as needed for allergies.   diphenoxylate-atropine (LOMOTIL) 2.5-0.025 MG tablet Take 1 tablet by mouth every 6 (six) hours as needed for diarrhea or loose stools. (Patient taking differently: Take 1 tablet by mouth 2 (two) times daily as needed for diarrhea or loose stools.)   furosemide (LASIX) 40 MG tablet Take 2 tablets (80 mg total) by mouth in the morning AND 2 tablets (80 mg total) every evening. (Patient taking differently: Take 80 mg by mouth in the morning and 80 mg by mouth at night. )   ivabradine (CORLANOR) 5 MG TABS tablet Take 5 mg by mouth 2 (two) times daily.   losartan (COZAAR) 25 MG tablet Take 0.5 tablets (12.5 mg total) by mouth daily.   metoprolol succinate (TOPROL-XL) 25 MG 24 hr tablet Take 1 tablet (25 mg total) by mouth daily. Take with or immediately following a meal.   metroNIDAZOLE (METROGEL) 1 % gel Apply 1 application topically daily. (Patient taking differently: Apply 1 application  topically  daily as needed (rosaecea).)   Multiple Vitamins-Minerals (MULTIVITAMIN WITH MINERALS) tablet Take 1 tablet by mouth daily.   oxymetazoline (AFRIN) 0.05 % nasal spray Place 1-2 sprays into both nostrils 2 (two) times daily as needed for congestion.   Polyethyl Glycol-Propyl Glycol (SYSTANE OP) Place 2 drops into the right eye as needed (irritation).   potassium chloride SA (KLOR-CON M) 20 MEQ tablet Take 2 tablets (40 mEq total) by mouth 2 (two) times daily.   promethazine (PHENERGAN) 25 MG tablet Take 1 tablet (25 mg total) by mouth every 6 (six) to 8 (eight) hours for nausea or vomiting. (Patient taking differently: Take 25 mg by mouth in the  morning, at noon, and at bedtime.)   spironolactone (ALDACTONE) 25 MG tablet Take 1 tablet (25 mg total) by mouth daily.   zolpidem (AMBIEN) 10 MG tablet Take 5-10 mg by mouth at bedtime.    Allergies: Allergies as of 03/01/2022 - Review Complete 03/01/2022  Allergen Reaction Noted   Sulfa antibiotics Hives, Itching, and Rash 04/20/2019   Erythromycin Hives 01/12/2018   Tramadol Rash and Other (See Comments) 01/12/2018   Ibuprofen Itching 03/01/2022   Tape Rash 03/17/2021   Past Medical History:  Diagnosis Date   Allergy    Anxiety 01/20/2021   CAD in native artery 08/24/2021   CHF (congestive heart failure) (HCC)    Chronic combined systolic and diastolic heart failure ([facility]) 76/72/0947   Complication of anesthesia    Depression    Hyperlipidemia    Myocardial infarction [facility])    Presence of cardiac defibrillator 07/2021   Shortness of breath 01/20/2021   Sleep apnea 07/2021   Tobacco abuse 01/20/2021    Family History: Mother (atrial fibrillation, stroke, HTN) , Father( CHF, diabetes, HTN), Brother( HTN, prolonged QT)  Social History: Lives with sister, fiancee on weekneds, Worked in Nurse, 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 Work has closed. Stoped smoking 01/15/2022, previous 35 pack year history, 2 drinks a month usually has mixed drink, no other illicit drug use   Review of Systems: A complete ROS was negative except as per HPI.   Physical Exam: Blood pressure 109/62, pulse 70, temperature 97.6 F (36.4 C), temperature source Oral, resp. rate (!) 66, height 5' 10"  (1.778 m), weight 86.2 kg, SpO2 97 %. Constitutional: Chronically ill appears.  HENT: Normocephalic and atraumatic, EOMI, conjunctiva normal, moist mucous membranes Cardiovascular: Normal rate, regular rhythm, S1 and S2 present, no murmurs, rubs, gallops.  Distal pulses intact. No JVD, no LE edema Respiratory: No respiratory distress, no accessory muscle use.  Effort is normal.  Lungs  are clear to auscultation bilaterally. GI: TTP of the LLQ and diffusely of the lower abdomen, normal BS, no distension, no guarding or masses. Musculoskeletal: Normal bulk and tone.  No peripheral edema noted. Neurological: Is alert and oriented x4, no apparent focal deficits noted. Skin: Warm and dry.  No rash, erythema, lesions noted. Psychiatric: Normal mood and affect. Behavior is normal. Judgment and thought content normal.    EKG: personally reviewed my interpretation is Heart rate 79, normal sinus rhythm, q wave II, II, avF, V4-6  CT chest abdomen pelvis 03/01/22  IMPRESSION: 1. No acute intrathoracic or intra-abdominal injury. 2. Mild coronary artery calcification. 3. 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 4. Stable 3.1 cm infrarenal abdominal aortic aneurysm. Recommend follow-up ultrasound every 3 years. This recommendation follows ACR consensus guidelines: White Paper of the ACR Incidental Findings Committee II  on Vascular Findings. J Am Coll Radiol 2013; 10:789-794. 5. Submucosal fatty infiltration involving the terminal ileum in keeping with changes of remote or recurrent inflammation. 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 No evidence of obstruction or perforation. 6. Aortic atherosclerosis. Assessment & Plan by Problem: Principal Problem:   Hypokalemia  Hypokalemia Chronic diarrhea Potassium noted to be 2.8.  Likely in the setting of chronic diarrhea.  Still having 3 loose stools per day.  Has been following with GI regarding this.  Underwent on 11/13 noted to have terminal ileus stricture with ulcerations.  Surgical pathology showing acute ileitis could be consistent with medication versus infection versus emerging IBD.  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 Is also on chronic diuress with Lasix 80 mg twice daily and takes potassium 40  mEq  twice daily. -Potassium chloride 40 mEq 3 times daily -Follow-up magnesium replete as needed -GI consulted appreciate recommendations -Lomotil as needed, Phenergan as needed -If she continues to have diarrhea will obtain stools lactoferrin, calprotectin, and fecal elastase  Chest pain Initially presented with 2 hours of squeezing pain under her ribs concerning for chest pain.  EKG without acute changes and troponins negative x2.  Does not seem that this is cardiac in nature pain is much improved on its own.  Lipase mildly elevated at 140 and she does describe radiation between the shoulder blades could be early pancreatitis although this seems unlikely given her reassuring CT scan.   -Continue to monitor for worsening pain - Tylenol PRN  Ischemic cardiomyopathy CAD Echo 1/262023  with LVEF <20%. Single bess disease on cardiac cath 01/2021 with occluded mid LAD. Missed 3 days of lasix due to colonoscopy but states she has taken it in the last 2 days. Appears euvolemic on exam. -Continue lasix 80 mg po twice daily  - continue faxiga, digoxin, ivabradine, and metoprolol  - continue atormvastatin - BP soft held losartan and spironolactone - monitor I/Os, weight, electrolytes   History of LV thrombus Again noted on CT chest. No acute signs of bleeding - continue eliquis 5 mg twice daily   Anxiety/depression -continue xanax 2 mg thee times daily   Dispo: Admit patient to Observation with expected length of stay less than 2 midnights.  Signed: Iona Beard, MD 03/01/2022, 2:09 PM  Pager: [phone] After 5pm on weekdays and 1pm on weekends: On Call pager: [phone]

## 2022-03-01 NOTE — ED Triage Notes (Signed)
Pt c/o chest pain that started last night while at rest. Pain described as squeezing under bilateral breast. Hx CHF and STEMI.

## 2022-03-01 NOTE — Progress Notes (Addendum)
SUBJ:   Called to bedside about patient having abdominal pain. Pain is located in the epigastric region and radiates to her back. she rates it a 6/10. She has never experienced this type of pain before. She is able to tolerate her diet well.   Physical Exam:   Constitutional: Well-appearing woman, in no acute distress  Abdominal: Normoactive bowel sounds heard, non-distended, tender to palpation in the epigastric area   Plan:   Pt initially presented with chronic diarrhea and hypokalemia. She states this does not feel like the chest pain that she entered the hospital with. Lipase level was elevated, and she has characteristic signs of pancreatitis with epigastric pain radiating towards her back. Imaging today did not reveal any signs of a pancreatitis flare, however this could be early or mild pancreatitis which often does not sure changes on CT. Other differentials include mesenteric ischemia, however lower on my differential list due to normal LA and pt being able to tolerate food well.  Difficult to start fluids on this patient due to severe heart failure (EF: 15%-20%)   Plan:  - One dose of .16m Dilaudid  - Will repeat Lipase  - If lipase continues to be elevated will determine fluid regimen for severe HF patient  - If lipase is WNL now, can treat as indigestion and use simethicone - Consider post-EGD pancreatitis

## 2022-03-02 ENCOUNTER — Encounter (HOSPITAL_COMMUNITY): Payer: Self-pay | Admitting: Gastroenterology

## 2022-03-02 ENCOUNTER — Inpatient Hospital Stay (HOSPITAL_COMMUNITY): Payer: Medicaid Other

## 2022-03-02 ENCOUNTER — Other Ambulatory Visit (HOSPITAL_COMMUNITY): Payer: Self-pay

## 2022-03-02 DIAGNOSIS — K529 Noninfective gastroenteritis and colitis, unspecified: Secondary | ICD-10-CM

## 2022-03-02 DIAGNOSIS — E876 Hypokalemia: Secondary | ICD-10-CM

## 2022-03-02 DIAGNOSIS — I2510 Atherosclerotic heart disease of native coronary artery without angina pectoris: Secondary | ICD-10-CM

## 2022-03-02 DIAGNOSIS — R0789 Other chest pain: Secondary | ICD-10-CM

## 2022-03-02 DIAGNOSIS — K5000 Crohn's disease of small intestine without complications: Principal | ICD-10-CM

## 2022-03-02 DIAGNOSIS — R935 Abnormal findings on diagnostic imaging of other abdominal regions, including retroperitoneum: Secondary | ICD-10-CM

## 2022-03-02 DIAGNOSIS — I10 Essential (primary) hypertension: Secondary | ICD-10-CM

## 2022-03-02 LAB — COMPREHENSIVE METABOLIC PANEL
ALT: 33 U/L (ref 0–44)
AST: 21 U/L (ref 15–41)
Albumin: 3.3 g/dL — ABNORMAL LOW (ref 3.5–5.0)
Alkaline Phosphatase: 94 U/L (ref 38–126)
Anion gap: 8 (ref 5–15)
BUN: 7 mg/dL (ref 6–20)
CO2: 26 mmol/L (ref 22–32)
Calcium: 8.6 mg/dL — ABNORMAL LOW (ref 8.9–10.3)
Chloride: 104 mmol/L (ref 98–111)
Creatinine, Ser: 0.84 mg/dL (ref 0.44–1.00)
GFR, Estimated: >60 mL/min (ref >60)
Glucose, Bld: 137 mg/dL — ABNORMAL HIGH (ref 70–99)
Potassium: 3.4 mmol/L — ABNORMAL LOW (ref 3.5–5.1)
Sodium: 138 mmol/L (ref 135–145)
Total Bilirubin: 0.5 mg/dL (ref 0.3–1.2)
Total Protein: 6 g/dL — ABNORMAL LOW (ref 6.5–8.1)

## 2022-03-02 LAB — BRAIN NATRIURETIC PEPTIDE: B Natriuretic Peptide: 65.2 pg/mL (ref 0.0–100.0)

## 2022-03-02 LAB — CBC
HCT: 37.4 % (ref 36.0–46.0)
Hemoglobin: 12.9 g/dL (ref 12.0–15.0)
MCH: 29.8 pg (ref 26.0–34.0)
MCHC: 34.5 g/dL (ref 30.0–36.0)
MCV: 86.4 fL (ref 80.0–100.0)
Platelets: 333 10*3/uL (ref 150–400)
RBC: 4.33 MIL/uL (ref 3.87–5.11)
RDW: 13.6 % (ref 11.5–15.5)
WBC: 7.5 10*3/uL (ref 4.0–10.5)
nRBC: 0 % (ref 0.0–0.2)

## 2022-03-02 LAB — PHOSPHORUS: Phosphorus: 2.9 mg/dL (ref 2.5–4.6)

## 2022-03-02 MED ORDER — KETOROLAC TROMETHAMINE 15 MG/ML IJ SOLN
15.0000 mg | Freq: Once | INTRAMUSCULAR | Status: AC
  Administered 2022-03-02: 15 mg via INTRAVENOUS
  Filled 2022-03-02: qty 1

## 2022-03-02 MED ORDER — ALUM & MAG HYDROXIDE-SIMETH 200-200-20 MG/5ML PO SUSP
30.0000 mL | ORAL | Status: DC | PRN
  Administered 2022-03-02: 30 mL via ORAL
  Filled 2022-03-02: qty 30

## 2022-03-02 MED ORDER — IOHEXOL 350 MG/ML SOLN
100.0000 mL | Freq: Once | INTRAVENOUS | Status: AC | PRN
  Administered 2022-03-02: 100 mL via INTRAVENOUS

## 2022-03-02 MED ORDER — DIPHENOXYLATE-ATROPINE 2.5-0.025 MG PO TABS
2.0000 | ORAL_TABLET | Freq: Four times a day (QID) | ORAL | 0 refills | Status: DC | PRN
  Filled 2022-03-02: qty 56, 7d supply, fill #0

## 2022-03-02 MED ORDER — BARIUM SULFATE 0.1 % PO SUSP
450.0000 mL | Freq: Once | ORAL | Status: AC
  Administered 2022-03-02: 450 mL via ORAL

## 2022-03-02 MED ORDER — SIMETHICONE 80 MG PO CHEW
80.0000 mg | CHEWABLE_TABLET | Freq: Once | ORAL | Status: AC
  Administered 2022-03-02: 80 mg via ORAL
  Filled 2022-03-02: qty 1

## 2022-03-02 MED ORDER — ALUM & MAG HYDROXIDE-SIMETH 200-200-20 MG/5ML PO SUSP
30.0000 mL | ORAL | 0 refills | Status: AC | PRN
  Filled 2022-03-02: qty 355, 2d supply, fill #0

## 2022-03-02 MED ORDER — FUROSEMIDE 40 MG PO TABS
80.0000 mg | ORAL_TABLET | Freq: Every day | ORAL | Status: DC
  Administered 2022-03-02: 80 mg via ORAL
  Filled 2022-03-02: qty 2

## 2022-03-02 MED ORDER — ENSURE ENLIVE PO LIQD
237.0000 mL | Freq: Two times a day (BID) | ORAL | Status: DC
  Administered 2022-03-02: 237 mL via ORAL

## 2022-03-02 MED ORDER — BUDESONIDE 3 MG PO CPEP
9.0000 mg | ORAL_CAPSULE | Freq: Every day | ORAL | 0 refills | Status: DC
  Filled 2022-03-02: qty 90, 30d supply, fill #0
  Filled 2022-03-27: qty 78, 26d supply, fill #1

## 2022-03-02 MED ORDER — ENSURE ENLIVE PO LIQD: 237.0000 mL | Freq: Two times a day (BID) | ORAL | 12 refills | Status: AC

## 2022-03-02 MED ORDER — POTASSIUM CHLORIDE CRYS ER 20 MEQ PO TBCR
40.0000 meq | EXTENDED_RELEASE_TABLET | Freq: Three times a day (TID) | ORAL | Status: AC
  Administered 2022-03-02 (×2): 40 meq via ORAL
  Filled 2022-03-02 (×2): qty 2

## 2022-03-02 MED ORDER — HYDROMORPHONE HCL 1 MG/ML IJ SOLN: 0.5000 mg | Freq: Once | INTRAMUSCULAR | Status: DC

## 2022-03-02 MED ORDER — MAGNESIUM SULFATE 2 GM/50ML IV SOLN
2.0000 g | Freq: Once | INTRAVENOUS | Status: AC
  Administered 2022-03-02: 2 g via INTRAVENOUS
  Filled 2022-03-02: qty 50

## 2022-03-02 MED ORDER — FUROSEMIDE 80 MG PO TABS
80.0000 mg | ORAL_TABLET | Freq: Every day | ORAL | 0 refills | Status: DC | PRN
  Filled 2022-03-02: qty 30, 30d supply, fill #0

## 2022-03-02 MED ORDER — BANATROL TF EN LIQD
60.0000 mL | Freq: Two times a day (BID) | ENTERAL | Status: DC
  Administered 2022-03-02: 60 mL via ORAL
  Filled 2022-03-02: qty 60

## 2022-03-02 NOTE — Discharge Instructions (Addendum)
Dear Ms. Renda,   It was a pleasure taking care of you. You were admitted to the hospital for workup of chest pain. You did not have a heart attack. Please go to your appointment with cardiologist Dr. Glori Bickers on 03/06/19 at 1:40 PM.   We also performed several tests to expedite the workup for your chronic diarrhea. The CT scan showed evidence of Crohn's disease. You will discuss these results in greater detail during a follow-up visit with your gastroenterologist. In the meantime, we have prescribed a medication called budesonide, which you will take for a total of eight weeks. You have an appointment with Levin Erp, PA at [facility] on 03/30/22 at 1:30 PM.  Please STOP taking Losartan and Spironolactone and discuss whether to resume these medications with Dr. Haroldine Laws on 11/20. Otherwise, please continue taking all of your medications as usual.    Take care,

## 2022-03-02 NOTE — Progress Notes (Addendum)
Rounding Note    Patient Name: Christine [name] Date of Encounter: 03/02/2022  [place] Cardiologist: Skeet Latch, MD   Subjective   Patient continues to have diarrhea and had some 6/10 abdominal pain last night. She is frustrated that the GI team has not been by to see her yet. She denies any more of the chest pain that she described as a "band-like" sensation. She is worried though because she is starting to feel more short of breath. She states her dry weight is around 184-186 lbs and that she weighed 192 lbs this morning. She also states her hand look a swollen.   Inpatient Medications    Scheduled Meds:  ALPRAZolam  2 mg Oral TID   apixaban  5 mg Oral BID   atorvastatin  80 mg Oral Daily   cyclobenzaprine  20 mg Oral QHS   dapagliflozin propanediol  10 mg Oral Daily   digoxin  0.125 mg Oral Daily   ivabradine  5 mg Oral BID   metoprolol succinate  25 mg Oral Daily   potassium chloride  40 mEq Oral TID   promethazine  25 mg Oral Q6H   Continuous Infusions:  magnesium sulfate bolus IVPB     PRN Meds: acetaminophen **OR** acetaminophen, diphenoxylate-atropine, zolpidem   Vital Signs    Vitals:   03/02/22 0029 03/02/22 0341 03/02/22 0846 03/02/22 0854  BP: 100/70 (!) 94/58  (!) 102/53  Pulse: 62 (!) 58 73 70  Resp: 18   18  Temp: 97.6 F (36.4 C) 97.8 F (36.6 C)  97.9 F (36.6 C)  TempSrc: Oral   Oral  SpO2: 97% 99%  96%  Weight:      Height:       No intake or output data in the 24 hours ending 03/02/22 1015    03/01/2022    1:07 AM 02/26/2022    8:29 AM 02/12/2022    1:03 PM  Last 3 Weights  Weight (lbs) 190 lb 188 lb 186 lb  Weight (kg) 86.183 kg 85.276 kg 84.369 kg      Telemetry    Sinus rhythm with rates in the 40s to 60s. - Personally Reviewed  ECG    No new ECG tracing today. - Personally Reviewed  Physical Exam   GEN: No acute distress.   Neck: No JVD. Cardiac: RRR. Soft I-II/VI systolic murmur noted. No rubs or  gallops. Respiratory: Clear to auscultation bilaterally. No wheezes, rhonchi, or rales. GI: Soft, non-distended, and non-tender. MS: No lower extremity edema. No deformity. Skin: Warm and dry. Neuro:  No focal deficits. Psych: Normal affect. Responds appropriately.  Labs    High Sensitivity Troponin:   Recent Labs  Lab 03/01/22 0137 03/01/22 0338  TROPONINIHS 10 9     Chemistry Recent Labs  Lab 03/01/22 0137 03/01/22 0339 03/02/22 0339  NA 133*  --  138  K 2.8*  --  3.4*  CL 95*  --  104  CO2 27  --  26  GLUCOSE 137*  --  137*  BUN 7  --  7  CREATININE 0.80  --  0.84  CALCIUM 8.9  --  8.6*  MG  --  1.6*  --   PROT 7.2  --  6.0*  ALBUMIN 3.9  --  3.3*  AST 25  --  21  ALT 42  --  33  ALKPHOS 115  --  94  BILITOT 0.5  --  0.5  GFRNONAA >60  --  >  60  ANIONGAP 11  --  8    Lipids  Recent Labs  Lab 03/01/22 0339  CHOL 185  TRIG 221*  HDL 36*  LDLCALC 105*  CHOLHDL 5.1    Hematology Recent Labs  Lab 03/01/22 0137 03/02/22 0339  WBC 7.9 7.5  RBC 4.75 4.33  HGB 13.8 12.9  HCT 40.6 37.4  MCV 85.5 86.4  MCH 29.1 29.8  MCHC 34.0 34.5  RDW 13.2 13.6  PLT 352 333   Thyroid No results for input(s): "TSH", "FREET4" in the last 168 hours.  BNPNo results for input(s): "BNP", "PROBNP" in the last 168 hours.  DDimer No results for input(s): "DDIMER" in the last 168 hours.   Radiology    CT CHEST ABDOMEN PELVIS W CONTRAST  Result Date: 03/01/2022 CLINICAL DATA:  Polytrauma, blunt Patient had recent endoscopy as well as colonoscopy endorsing chest pain as well as muscle pain. Chest pain, myalgia following endoscopy EXAM: CT CHEST, ABDOMEN, AND PELVIS WITH CONTRAST TECHNIQUE: Multidetector CT imaging of the chest, abdomen and pelvis was performed following the standard protocol during bolus administration of intravenous contrast. RADIATION DOSE REDUCTION: This exam was performed according to the departmental dose-optimization program which includes automated  exposure control, adjustment of the mA and/or kV according to patient size and/or use of iterative reconstruction technique. CONTRAST:  50m OMNIPAQUE IOHEXOL 350 MG/ML SOLN COMPARISON:  CT abdomen pelvis 12/21/2021, CT chest 01/20/2021 FINDINGS: CT CHEST FINDINGS Cardiovascular: Mild coronary artery calcification. Global cardiac size is within normal limits. 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 Left subclavian single lead pacemaker in place with its lead within the right ventricular apex extending into the epicardium. No pericardial effusion. Central pulmonary arteries are of normal caliber. Thoracic aorta is unremarkable. Mediastinum/Nodes: No enlarged mediastinal, hilar, or axillary lymph nodes. Thyroid gland, trachea, and esophagus demonstrate no significant findings. Lungs/Pleura: Lungs are clear. No pleural effusion or pneumothorax. Musculoskeletal: Osseous structures are age-appropriate. No acute bone abnormality. No lytic or blastic bone lesion. CT ABDOMEN PELVIS FINDINGS Hepatobiliary: No focal liver abnormality is seen. Status post cholecystectomy. No biliary dilatation. Pancreas: Unremarkable Spleen: Unremarkable Adrenals/Urinary Tract: The adrenal glands are unremarkable. Kidneys are normal in size and position. Mild bilateral renal cortical scarring. No enhancing intrarenal masses. No intrarenal or ureteral calculi. No hydronephrosis. Bladder unremarkable. Stomach/Bowel: There is submucosal fatty infiltration involving the terminal ileum in keeping with changes of remote or recurrent inflammation. 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 No evidence of obstruction or perforation. No free intraperitoneal gas or fluid. No loculated intra-abdominal fluid collections. Appendix normal.  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 Mild superimposed aortoiliac atherosclerotic calcification. No pathologic adenopathy within the abdomen and pelvis. Reproductive: Status post hysterectomy. No adnexal masses. Other: No abdominal wall hernia or abnormality. No abdominopelvic ascites. Musculoskeletal: No acute bone abnormality. IMPRESSION: 1. No acute intrathoracic or intra-abdominal injury. 2. Mild coronary artery calcification. 3. 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 4. Stable 3.1 cm infrarenal abdominal aortic aneurysm. Recommend follow-up ultrasound every 3 years. This recommendation follows ACR consensus guidelines: White Paper of the ACR Incidental Findings Committee II on Vascular Findings. J Am Coll Radiol 2013; 10:789-794. 5. Submucosal fatty infiltration involving the terminal ileum in keeping with changes of remote or recurrent inflammation. 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 No evidence of obstruction or perforation. 6. Aortic atherosclerosis. Aortic Atherosclerosis (ICD10-I70.0). Electronically Signed   By: Fidela Salisbury M.D.   On: 03/01/2022 03:32    Cardiac Studies   Right/ Left Cardiac Catheterization 01/23/2022:   Mid LAD to Dist LAD lesion is 99% stenosed.   2nd Sept lesion is 99% stenosed.   Prox Cx to Mid Cx lesion is 20% stenosed.   Ost LM lesion is 20% stenosed.   The left ventricular ejection fraction is less than 25% by visual estimate.   Findings: RA = not sampled RV = not sampled PA = 36/16 (29) PCW = 27 (v = 35) Fick cardiac output/index = 4.7/2.2 PVR = 0.8 WU Ao at = 99% PA sat = 68%, 65% High SVC = 75%   Assessment: Severe 1v CAD with occlusion of mid LAD Ischemic CM EF 20-25% Elevated filling pressures with moderately reduced cardiac output Significant v waves in PCWP tracing  suggestive of significant MR vs diastolic dysfunction   Plan/Discussion: Degree of LV dysfunction seems out of proportion to CAD. Will check cMRI. Medical therapy. Continue diuresis. Add digoxin. Hold b-blocker.  Diagnostic Dominance: Right   _______________  Cardiac MRI 01/24/2022: Impression: 1. Subendocardial late gadolinium enhancement consistent with prior infarcts in LV basal inferolateral wall, apical anterior/septal/inferior walls, and apex. LGE is greater than 50% transmural suggesting these areas are not viable 2.  LV apical thrombus measuring 46m x 834m3.  Severe LV dilatation with severe systolic dysfunction (EF 2272%4.  Normal RV size and systolic function (EF 5253%5. Mitral regurgitation appears at least moderate visually, was not Quantified _______________  Echocardiogram 05/11/2021: Impressions:  1. Global hypokinesis with apical akinesis; definity-apical swirling  noted but no obvious thrombus.   2. Left ventricular ejection fraction, by estimation, is <20%. The left  ventricle has severely decreased function. The left ventricle demonstrates  regional wall motion abnormalities (see scoring diagram/findings for  description). The left ventricular  internal cavity size was severely dilated. Left ventricular diastolic  parameters are consistent with Grade III diastolic dysfunction  (restrictive).   3. Right ventricular systolic function is normal. The right ventricular  size is normal.   4. The mitral valve is normal in structure. Mild mitral valve  regurgitation. No evidence of mitral stenosis.   5. The aortic valve is tricuspid. Aortic valve regurgitation is not  visualized. No aortic stenosis is present.   6. Aortic dilatation noted. There is mild dilatation of the aortic root,  measuring 40 mm. There is mild dilatation of the ascending aorta,  measuring 40 mm.   7. The inferior vena cava is normal in size with greater than 50%  respiratory variability,  suggesting right atrial pressure of 3 mmHg.     Patient Profile     [age].o. 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  Assessment & Plan    Chest Pain CAD History of CAD with cardiac catheterization in 01/2021 during an admission for new onset CHF showing 99% stenosis of mid to distal LAD and 99% stenosis of 2nd septal branch. Medical therapy was recommended. Patient presented with 2 times of chest pain.  She described the first type of chest pain as left sided pain that last between 5-10 seconds at rest and then resolves which she has had intermittently for the past 3 weeks (not felt to be cardiac in nature). She then described the second type of chest pain as a tight band-like sensation across her lower chest that started the evening prior to admission and lasted for several hours. EKG shows no acute changes. High-sensitivity troponin negative x2 despite prolonged episode of chest pain.  - She denies any recurrent chest pain similar to what brought her in. - No aspirin due to need for Eliquis. Continue statin. - Symptoms fel to be atypical and felt to be more GI in nature. No further ischemic evaluation planned.  Chronic Combined  CHF  Ischemic Cardiomyopathy S/p ICD Initially diagnosed in 01/2021 when found to have an EF of 20-25%. Cardiac catheterization as above. Cardiac MRI showed subendocardial late gadolinium consistent with prior infarcts in the LV basal inferolateral wall, apical anterior/septal/inferior walls, and apex. LGE >50% transmural suggesting these areas are not viable. Last Echo in 04/2021 showed LVEF of <20% with grade III diastolic dysfunction and  mild MR. - Patient concerned because she is starting to feel more short of breath and reports her hands are starting to swell. She also reports her weight is starting to trend up. She states her dry weight is around 184-186 lbs but states she weighed 192 lbs this morning. She does not appear volume overloaded on exam.  - Will check a BNP. - BP is soft with systolic BP in the 41L to low 100s but she states this is normal for her. Her Lasix, Losartan, and Spironolactone were all held on admission. She is still having diarrhea and nausea but I think we need to restart at least one of her diuretics. Thankfully her renal function is normal. Potassium is still slightly low so I will discuss with Dr. Oval Linsey about starting Lasix vs Spironolactone vs both. - Continue home Toprol-XL 33m daily, Farxiga 144mdaily,  Digoxin 0.12542maily, Ivabradine 5mg63mice daily.  - Continue to monitor daily weights, strict I/Os, and renal function.  LV Thrombus Noted on cardiac MRI in 01/2021. CT this admission showed LV apical aneurysm with small amount of adherent mural thrombus. - Continue Eliquis 5mg 13mce daily.  Ascending Thoracic Aortic Aneurysm AAA She has a known 4.0cm aneurysm of ascending aorta noted on Echo in 04/2021 and 3.1 cm aneurysm of the infrarenal abdominal aorta noted on CT this admission..  - BP stable. - Will need serial outpatient monitoring.  Hypokalemia In setting of diarrhea. Potassium initially 2.8.  Supplemented and improved to 3.4 today. - Primary team is supplementing.  Otherwise, per primary team: - Abdominal pain - Chronic Diarrhea - Anxiety/depression  For questions or updates, please contact [facility] consult [email or web address] for contact info under        Signed, CalliDarreld McleanC  03/02/2022, 10:15 AM

## 2022-03-02 NOTE — Consult Note (Signed)
Consultation  Referring Provider:  Med service Donalee Citrin  Primary Care Physician:  Ladell Pier, MD Primary Gastroenterologist:  Dr.Cirigliano   Reason for Consultation:   Diarrhea  HPI: Christine [name] is a 56 y.o. female, known to Dr. Bryan Lemma who has been undergoing outpatient work-up for multiple month history of diarrhea which she says initially started around May 2023. She has history of ischemic cardiomyopathy with EF of 15 to 20%, status post ICD, history of coronary artery disease sleep apnea and history of left ventricular thrombus on chronic anticoagulation.  Also with ADHD and anxiety/depression. Patient was admitted to the hospital on 05/01/2021 after she presented with atypical chest pain. Per notes EKG and troponins unremarkable. Has been seen by cardiology who do not feel that the current episode is cardiac related. Her pain had resolved by the time she was admitted and has not recurred. Patient just underwent EGD and colonoscopy as an outpatient earlier this week per Dr. Bryan Lemma which had to be done at the hospital due to her ischemic cardiomyopathy.  Upper endoscopy was normal and biopsies from the stomach and duodenum were unremarkable, H. pylori negative. Colonoscopy with normal-appearing mucosa, she was noted to have multiple diverticuli and one 3 mm polyp was removed from the sigmoid colon. Biopsies were done from the terminal ileum and show an acute ileitis with ulceration, biopsies from the right and left colon are negative and the polyp was hyperplastic. Dr. Vivia Ewing plan was for her to have a CT enterography, and follow-up labs with sed rate and CRP. Patient has been using Lomotil at home for control of the diarrhea.  She is quite frustrated at this point because she has been having symptoms for so long without a definite answer. She describes intermittent episodes of very urgent diarrhea with incontinence, followed by some episodes of either normal  bowel movements or constipation.  The episodes of diarrhea are very sporadic, will result in multiple diarrheal stools and as above very frequently incontinence.  She has not had any melena or hematochezia.  No ongoing abdominal pain, does get some mild lower abdominal cramping with the episodes of diarrhea.  She started having one of her acute episodes this past weekend just prior to starting her bowel prep, after the colonoscopy she was okay overnight and then the following day started having urgent diarrhea again which has persisted through to today.  She says she had about 3 episodes of urgent diarrhea yesterday and 2 today.. She is requesting to have what ever work-up she needs done while she is here if possible.  On other questioning she has been taking Lomotil 1 tablet up to 4 times daily  Labs today-WBC 7.5/hemoglobin 12.9/hematocrit 37.4 Sodium 138/potassium 3.4 BUN 7/creatinine 0.84 LFTs within normal limits  Fecal calprotectin, pancreatic elastase and lactoferrin are pending GI path panel was also ordered  Patient has been cleared for discharge from cardiac standpoint and has a follow-up appointment with Dr. Haroldine Laws on Monday   Past Medical History:  Diagnosis Date   Allergy    Anxiety 01/20/2021   CAD in native artery 08/24/2021   CHF (congestive heart failure) ([place])    Chronic combined systolic and diastolic heart failure ([place]) 99/37/1696   Complication of anesthesia    Depression    Hyperlipidemia    Myocardial infarction [facility])    Presence of cardiac defibrillator 07/2021   Shortness of breath 01/20/2021   Sleep apnea 07/2021   Tobacco abuse 01/20/2021    Past Surgical History:  Procedure Laterality Date   ABDOMINAL HYSTERECTOMY     BIOPSY  02/26/2022   Procedure: BIOPSY;  Surgeon: Lavena Bullion, DO;  Location: [facility];  Service: Gastroenterology;;   CARDIAC CATHETERIZATION     CHOLECYSTECTOMY     COLONOSCOPY WITH PROPOFOL N/A 02/26/2022   Procedure:  COLONOSCOPY WITH PROPOFOL;  Surgeon: Lavena Bullion, DO;  Location: [facility];  Service: Gastroenterology;  Laterality: N/A;   ESOPHAGOGASTRODUODENOSCOPY (EGD) WITH PROPOFOL N/A 02/26/2022   Procedure: ESOPHAGOGASTRODUODENOSCOPY (EGD) WITH PROPOFOL;  Surgeon: Lavena Bullion, DO;  Location: [facility];  Service: Gastroenterology;  Laterality: N/A;   ICD IMPLANT N/A 07/21/2021   Procedure: ICD IMPLANT;  Surgeon: Deboraha Sprang, MD;  Location: [facility];  Service: Cardiovascular;  Laterality: N/A;   NECK SURGERY     OVARIAN CYST SURGERY     POLYPECTOMY  02/26/2022   Procedure: POLYPECTOMY;  Surgeon: Lavena Bullion, DO;  Location: [facility];  Service: Gastroenterology;;   RIGHT/LEFT HEART CATH AND CORONARY ANGIOGRAPHY N/A 01/23/2021   Procedure: RIGHT/LEFT HEART CATH AND CORONARY ANGIOGRAPHY;  Surgeon: Jolaine Artist, MD;  Location: [facility];  Service: Cardiovascular;  Laterality: N/A;    Prior to Admission medications   Medication Sig Start Date End Date Taking? Authorizing Provider  acetaminophen (TYLENOL) 500 MG tablet Take 1,000 mg by mouth 3 (three) times daily as needed for moderate pain or headache.   Yes [provider]  ALPRAZolam Duanne Moron) 1 MG tablet Take 2 mg by mouth 3 (three) times daily.   Yes [provider]  apixaban (ELIQUIS) 5 MG TABS tablet Take 1 tablet (5 mg total) by mouth 2 (two) times daily. 11/10/21  Yes Bensimhon, Shaune Pascal, MD  atorvastatin (LIPITOR) 80 MG tablet Take 1 tablet (80 mg total) by mouth daily. 01/19/22  Yes Larey Dresser, MD  cyclobenzaprine (FLEXERIL) 10 MG tablet Take 20 mg by mouth at bedtime.   Yes [provider]  dapagliflozin propanediol (FARXIGA) 10 MG TABS tablet Take 1 tablet (10 mg total) by mouth daily. 10/20/21  Yes Bensimhon, Shaune Pascal, MD  digoxin (LANOXIN) 0.125 MG tablet Take 1 tablet (0.125 mg total) by mouth daily. 02/12/22  Yes Bensimhon, Shaune Pascal, MD  diphenhydrAMINE  (BENADRYL) 25 MG tablet Take 50 mg by mouth as needed for allergies.   Yes [provider]  diphenoxylate-atropine (LOMOTIL) 2.5-0.025 MG tablet Take 1 tablet by mouth every 6 (six) hours as needed for diarrhea or loose stools. Patient taking differently: Take 1 tablet by mouth 2 (two) times daily as needed for diarrhea or loose stools. 01/30/22  Yes Levin Erp, PA  furosemide (LASIX) 40 MG tablet Take 2 tablets (80 mg total) by mouth in the morning AND 2 tablets (80 mg total) every evening. Patient taking differently: Take 80 mg by mouth in the morning and 80 mg by mouth at night.  01/09/22  Yes Ladell Pier, MD  ivabradine (CORLANOR) 5 MG TABS tablet Take 5 mg by mouth 2 (two) times daily.   Yes [provider]  losartan (COZAAR) 25 MG tablet Take 0.5 tablets (12.5 mg total) by mouth daily. 11/06/21  Yes Ladell Pier, MD  metoprolol succinate (TOPROL-XL) 25 MG 24 hr tablet Take 1 tablet (25 mg total) by mouth daily. Take with or immediately following a meal. 06/26/21  Yes Bensimhon, Shaune Pascal, MD  metroNIDAZOLE (METROGEL) 1 % gel Apply 1 application topically daily. Patient taking differently: Apply 1 application  topically  daily as needed (rosaecea). 05/09/21  Yes Ladell Pier, MD  Multiple Vitamins-Minerals (MULTIVITAMIN WITH MINERALS) tablet Take 1 tablet by mouth daily.   Yes [provider]  oxymetazoline (AFRIN) 0.05 % nasal spray Place 1-2 sprays into both nostrils 2 (two) times daily as needed for congestion.   Yes [provider]  Polyethyl Glycol-Propyl Glycol (SYSTANE OP) Place 2 drops into the right eye as needed (irritation).   Yes [provider]  potassium chloride SA (KLOR-CON M) 20 MEQ tablet Take 2 tablets (40 mEq total) by mouth 2 (two) times daily. 02/27/22  Yes Ladell Pier, MD  promethazine (PHENERGAN) 25 MG tablet Take 1 tablet (25 mg total) by mouth every 6 (six) to 8 (eight) hours for nausea or  vomiting. Patient taking differently: Take 25 mg by mouth in the morning, at noon, and at bedtime. 02/27/22  Yes Levin Erp, PA  spironolactone (ALDACTONE) 25 MG tablet Take 1 tablet (25 mg total) by mouth daily. 02/12/22  Yes Bensimhon, Shaune Pascal, MD  zolpidem (AMBIEN) 10 MG tablet Take 5-10 mg by mouth at bedtime.   Yes [provider]  loperamide (IMODIUM A-D) 2 MG tablet Take 2-4 mg by mouth 4 (four) times daily as needed for diarrhea or loose stools. Patient not taking: Reported on 03/01/2022    [provider]    Current Facility-Administered Medications  Medication Dose Route Frequency Provider Last Rate Last Admin   acetaminophen (TYLENOL) tablet 650 mg  650 mg Oral Q6H PRN Iona Beard, MD   650 mg at 03/02/22 0846   Or   acetaminophen (TYLENOL) suppository 650 mg  650 mg Rectal Q6H PRN Iona Beard, MD       ALPRAZolam Duanne Moron) tablet 2 mg  2 mg Oral TID Iona Beard, MD   2 mg at 03/02/22 0845   alum & mag hydroxide-simeth (MAALOX/MYLANTA) 200-200-20 MG/5ML suspension 30 mL  30 mL Oral Q4H PRN Atway, Rayann N, DO       apixaban (ELIQUIS) tablet 5 mg  5 mg Oral BID Iona Beard, MD   5 mg at 03/02/22 0846   atorvastatin (LIPITOR) tablet 80 mg  80 mg Oral Daily Iona Beard, MD   80 mg at 03/02/22 0846   cyclobenzaprine (FLEXERIL) tablet 20 mg  20 mg Oral QHS Iona Beard, MD   20 mg at 03/01/22 2215   dapagliflozin propanediol (FARXIGA) tablet 10 mg  10 mg Oral Daily Iona Beard, MD   10 mg at 03/02/22 0845   digoxin (LANOXIN) tablet 0.125 mg  0.125 mg Oral Daily Iona Beard, MD   0.125 mg at 03/02/22 0846   diphenoxylate-atropine (LOMOTIL) 2.5-0.025 MG per tablet 1 tablet  1 tablet Oral Q6H PRN Iona Beard, MD       feeding supplement (ENSURE ENLIVE / ENSURE PLUS) liquid 237 mL  237 mL Oral BID BM Narendra, Nischal, MD       fiber supplement (BANATROL TF) liquid 60 mL  60 mL Oral BID Aldine Contes, MD       furosemide (LASIX)  tablet 80 mg  80 mg Oral Daily Skeet Latch, MD       ivabradine Steffanie Dunn) tablet 5 mg  5 mg Oral BID Iona Beard, MD   5 mg at 03/02/22 0845   metoprolol succinate (TOPROL-XL) 24 hr tablet 25 mg  25 mg Oral Daily Iona Beard, MD   25 mg at 03/01/22 1308   potassium chloride SA (KLOR-CON M) CR tablet 40 mEq  40 mEq Oral TID Serita Butcher, MD   40 mEq at 03/02/22 0846   promethazine (PHENERGAN) tablet 25 mg  25 mg Oral Q6H Iona Beard, MD   25 mg at 03/02/22 1229   zolpidem (AMBIEN) tablet 5 mg  5 mg Oral QHS PRN Iona Beard, MD   5 mg at 03/01/22 2215    Allergies as of 03/01/2022 - Review Complete 03/01/2022  Allergen Reaction Noted   Sulfa antibiotics Hives, Itching, and Rash 04/20/2019   Erythromycin Hives 01/12/2018   Tramadol Rash and Other (See Comments) 01/12/2018   Ibuprofen Itching 03/01/2022   Tape Rash 03/17/2021    Family History  Problem Relation Age of Onset   Stroke Mother    Atrial fibrillation Mother    Heart failure Father    Stomach cancer Maternal Grandfather    Liver cancer Neg Hx    Esophageal cancer Neg Hx    Colon polyps Neg Hx    Colon cancer Neg Hx     Social History   Socioeconomic History   Marital status: Single    Spouse name: Not on file   Number of children: 0   Years of education: 5   Highest education level: Not on file  Occupational History   Not on file  Tobacco Use   Smoking status: Former    Packs/day: 0.50    Types: Cigarettes    Quit date: 01/15/2021    Years since quitting: 1.1   Smokeless tobacco: Never  Vaping Use   Vaping Use: Never used  Substance and Sexual Activity   Alcohol use: Yes    Comment: rarely   Drug use: Never   Sexual activity: Not on file  Other Topics Concern   Not on file  Social History Narrative   Not on file   Social Determinants of Health   Financial Resource Strain: Medium Risk (01/31/2021)   Overall Financial Resource Strain (CARDIA)    Difficulty of Paying Living  Expenses: Somewhat hard  Food Insecurity: No Food Insecurity (01/31/2021)   Hunger Vital Sign    Worried About Running Out of Food in the Last Year: Never true    Ran Out of Food in the Last Year: Never true  Transportation Needs: No Transportation Needs (01/31/2021)   PRAPARE - Hydrologist (Medical): No    Lack of Transportation (Non-Medical): No  Physical Activity: Not on file  Stress: Not on file  Social Connections: Not on file  Intimate Partner Violence: Not on file    Review of Systems: Pertinent positive and negative review of systems were noted in the above HPI section.  All other review of systems was otherwise negative. Older WF  Physical Exam: Vital signs in last 24 hours: Temp:  [97.6 F (36.4 C)-98.6 F (37 C)] 97.9 F (36.6 C) (11/17 0854) Pulse Rate:  [58-77] 70 (11/17 0854) Resp:  [18-20] 18 (11/17 0854) BP: (94-137)/(50-88) 102/53 (11/17 0854) SpO2:  [94 %-99 %] 96 % (11/17 0854) Last BM Date : 03/02/22 General:   Alert,  Well-developed, well-nourished, older WF pleasant and cooperative in NAD Head:  Normocephalic and atraumatic. Eyes:  Sclera clear, no icterus.   Conjunctiva pink. Ears:  Normal auditory acuity. Nose:  No deformity, discharge,  or lesions. Mouth:  No deformity or lesions.   Neck:  Supple; no masses or thyromegaly. Lungs:  Clear throughout to auscultation.   No wheezes, crackles, or rhonchi. Heart:  Regular rate and rhythm; soft systolic murmur  Abdomen:  Soft,nontender, BS active,nonpalp mass or hsm.   Rectal: not done . Pulses:  Normal pulses noted. Extremities:  Without clubbing or edema. Neurologic:  Alert and  oriented x4;  grossly normal neurologically. Skin:  Intact without significant lesions or rashes.. Psych:  Alert and cooperative. Normal mood and affect.  Intake/Output from previous day: No intake/output data recorded. Intake/Output this shift: No intake/output data recorded.  Lab  Results: Recent Labs    03/01/22 0137 03/02/22 0339  WBC 7.9 7.5  HGB 13.8 12.9  HCT 40.6 37.4  PLT 352 333   BMET Recent Labs    03/01/22 0137 03/02/22 0339  NA 133* 138  K 2.8* 3.4*  CL 95* 104  CO2 27 26  GLUCOSE 137* 137*  BUN 7 7  CREATININE 0.80 0.84  CALCIUM 8.9 8.6*   LFT Recent Labs    03/02/22 0339  PROT 6.0*  ALBUMIN 3.3*  AST 21  ALT 33  ALKPHOS 94  BILITOT 0.5   PT/INR No results for input(s): "LABPROT", "INR" in the last 72 hours. Hepatitis Panel No results for input(s): "HEPBSAG", "HCVAB", "HEPAIGM", "HEPBIGM" in the last 72 hours.   IMPRESSION:  #57  56 year old white female with severe ischemic cardiomyopathy, status post ICD placement with EF of 15%, and history of coronary artery disease on chronic anticoagulation with history of LV thrombus who presented to the emergency room yesterday with chest pain. Workup has been negative for cardiac etiologies and pain has resolved  #2 several month history of diarrhea initial onset May 2023  Patient has been undergoing outpatient work-up and just had colonoscopy earlier this week as outlined above He is frustrated because she has had recurrent diarrhea since the procedure on Monday which has been very urgent and with incontinence  Colonoscopy was negative other than diverticulosis and 1 small hyperplastic polyp, she did have biopsy taken from the terminal ileum. Random colon biopsies from the right and left colon are unremarkable, polyp was hyperplastic, biopsy from the terminal ileum showed acute ileitis with ulceration  Rule out Crohn's/IBD  Plan was for her to have outpatient CT enterography and further labs  Patient requested GI evaluation inpatient.  #3 sleep apnea #4.  ADHD/anxiety depression  Plan; we have contacted CT and CT enterography can be late done later today we will proceed with that while she is inpatient Patient she could increase Lomotil to 2 tablets up to 4 times daily as  needed for diarrhea Stool studies are pending, ordered by house staff including fecal calprotectin Expect she will be able to be discharged from a GI perspective after CT enterography, which we will review and then determine need for any additional medication. She will also need to be scheduled for outpatient follow-up with GI      Raylea Adcox PA-C 03/02/2022, 1:57 PM

## 2022-03-02 NOTE — Progress Notes (Signed)
Assessed discharge readiness. Family is at the bedside and staff RN is discharging the patient.  Levora Dredge RN

## 2022-03-02 NOTE — Discharge Summary (Cosign Needed Addendum)
Name: Christine [name] MRN: [id number] DOB: 09-03-1965 56 y.o. PCP: Christine Pier, MD  Date of Admission: 03/01/2022 12:57 AM Date of Discharge: 11/17/202311-17-2023 Attending Physician: No att. providers found  Discharge Diagnosis: Chronic diarrhea CHF systolic and diastolic dysfunction Ischemic cardiomyopathy and CAD s/p ICD LV thrombus on anticoagulation OSA on CPAP ADHD Anxiety/Depression  Discharge Medications: Allergies as of 03/02/2022       Reactions   Sulfa Antibiotics Hives, Itching, Rash   Erythromycin Hives   Tramadol Rash, Other (See Comments)   Urinary retention   Ibuprofen Itching   Tape Rash        Medication List     STOP taking these medications    loperamide 2 MG tablet Commonly known as: IMODIUM A-D   losartan 25 MG tablet Commonly known as: COZAAR   spironolactone 25 MG tablet Commonly known as: ALDACTONE       TAKE these medications    acetaminophen 500 MG tablet Commonly known as: TYLENOL Take 1,000 mg by mouth 3 (three) times daily as needed for moderate pain or headache.   ALPRAZolam 1 MG tablet Commonly known as: XANAX Take 2 mg by mouth 3 (three) times daily.   alum & mag hydroxide-simeth 200-200-20 MG/5ML suspension Commonly known as: MAALOX/MYLANTA Take 30 mLs by mouth every 4 (four) hours as needed for indigestion or heartburn.   apixaban 5 MG Tabs tablet Commonly known as: ELIQUIS Take 1 tablet (5 mg total) by mouth 2 (two) times daily.   atorvastatin 80 MG tablet Commonly known as: LIPITOR Take 1 tablet (80 mg total) by mouth daily.   budesonide 3 MG 24 hr capsule Commonly known as: Entocort EC Take 3 capsules (9 mg total) by mouth daily.   Corlanor 5 MG Tabs tablet Generic drug: ivabradine Take 5 mg by mouth 2 (two) times daily.   cyclobenzaprine 10 MG tablet Commonly known as: FLEXERIL Take 20 mg by mouth at bedtime.   digoxin 0.125 MG tablet Commonly known as: LANOXIN Take 1 tablet (0.125 mg  total) by mouth daily.   diphenhydrAMINE 25 MG tablet Commonly known as: BENADRYL Take 50 mg by mouth as needed for allergies.   diphenoxylate-atropine 2.5-0.025 MG tablet Commonly known as: LOMOTIL Take 2 tablets by mouth 4 (four) times daily as needed for diarrhea or loose stools. What changed:  how much to take when to take this reasons to take this   Farxiga 10 MG Tabs tablet Generic drug: dapagliflozin propanediol Take 1 tablet (10 mg total) by mouth daily.   feeding supplement Liqd Take 237 mLs by mouth 2 (two) times daily between meals. Start taking on: March 03, 2022   furosemide 80 MG tablet Commonly known as: LASIX Take 1 tablet (80 mg total) by mouth daily as needed. As needed for weight gain (3 pounds in one day or 5 pounds in one week) or worsening edema. What changed:  medication strength See the new instructions.   metoprolol succinate 25 MG 24 hr tablet Commonly known as: TOPROL-XL Take 1 tablet (25 mg total) by mouth daily. Take with or immediately following a meal.   metroNIDAZOLE 1 % gel Commonly known as: Metrogel Apply 1 application topically daily. What changed:  when to take this reasons to take this   multivitamin with minerals tablet Take 1 tablet by mouth daily.   oxymetazoline 0.05 % nasal spray Commonly known as: AFRIN Place 1-2 sprays into both nostrils 2 (two) times daily as needed for congestion.   potassium chloride  SA 20 MEQ tablet Commonly known as: KLOR-CON M Take 2 tablets (40 mEq total) by mouth 2 (two) times daily.   promethazine 25 MG tablet Commonly known as: PHENERGAN Take 1 tablet (25 mg total) by mouth every 6 (six) to 8 (eight) hours for nausea or vomiting. What changed: when to take this   SYSTANE OP Place 2 drops into the right eye as needed (irritation).   zolpidem 10 MG tablet Commonly known as: AMBIEN Take 5-10 mg by mouth at bedtime.        Disposition and follow-up:   Christine [name] was  discharged from [facility] in Stable condition.  At the hospital follow up visit please address:  1. Chronic diarrhea, any further episodes of chest tightness/epigastric pain  2.  Labs / imaging needed at time of follow-up: none  3.  Pending labs/ test needing follow-up: stool study results  Follow-up Appointments:  03/05/2022 1:40 PM Bensimhon, Shaune Pascal, MD [facility]   03/30/2022 1:30 PM Christine Erp, PA [facility]   05/11/2022 11:10 AM (Arrive by 10:55 AM) Christine Pier, MD [facility] Course by problem list: Christine [name] is a 56 year old female with HFrEF (LVEF <20%) and CAD c/b LAD occlusion s/p ICD, chronic tobacco use,OSA on CPAP, history of LV thrombus on eliquis, and ADHD, anxiety/depression, and PTSD who presented with 1 day of bilateral chest-epigastric pain and chronic diarrhea.    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  She was found to have hypokalemia to 2.8, likely due to chronic diarrhea. We repleted potassium and magnesium. She takes potassium supplements at home. Patient is established with GI and had been undergoing workup prior to admission. Patient received colonoscopy on 11/13 showing terminal ileus stricture with ulcerations.  Surgical pathology showing acute ileitis could be consistent with medication versus infection versus emerging IBD. Abdominal CT demonstrated acute on chronic terminal ileitis while CT enterography revealed evidence of Crohn's disease. Eight-week course of budesonide was prescribed upon discharge and follow up with [facility] outpatient scheduled. Until then, GI has recommended to use lomotil 2 tablets up  to 4 times daily.  Chest tightness Epigastric pain Initially presented with 2 hours of squeezing pain under her ribs radiating to her back that felt similar to prior MI pain.  EKG without acute changes and troponins negative x2. Chest pain resolved prior to admission, however patient also developed similar pain in the epigastric region that radiated to her back.  Lipase was elevated to 140 on admission, but normalized to 35 and there was no evidence of pancreatitis on CT.  Pain refractory to acetaminophen and ketorolac but responded to one dose of hydromorphone, additional doses withheld due to home alprazolam regimen.  Cardiology consulted, they felt that chest tightness and abdominal pain were not cardiac in nature.   Ischemic cardiomyopathy CAD Most recent echo 04/2021 revealed LVEF less than 20% with grade 3 diastolic dysfunction. She follows in the heart failure clinic and has an ICD in place. Patient had missed 3 days of lasix 2/2 diarrhea, appeared euvolemic during admission but stated she could feel some fluid in her fingers. BNP was 65.2. Patient's losartan and spironolactone were held throughout hospitalization given hypokalemia. Upon discharge, patient was  encouraged to discuss with cardiology at appointment on 03/05/22.  Ascending aortic aneurysm Abdominal aortic aneurysm Ascending aorta 4.0 cm and infrarenal abdominal aorta 3.1 cm again present on CT. Patient has minimized her risk with tobacco cessation and BP control, but is recommended to receive follow-up ultrasound every 3 years.  Anxiety/ Depression Continued patient's home regimen of xanax 42m tid and ambien 5-112mat night. Concerned that this may be not adequately treating her symptoms and carries high risk, however, felt that optimizing this regimen is better addressed by primary care provider.   Discharge Vitals:   BP (!) 102/53 (BP Location: Left Arm)   Pulse 70   Temp 97.9 F (36.6 C) (Oral)   Resp 18   Ht 5' 10"   (1.778 m)   Wt 86.2 kg   SpO2 96%   BMI 27.26 kg/m   Subjective  Patient tearful at times, is hopeful that there is a solution to her chronic diarrhea and expresses worry that there is not a diagnosis yet.    Physical exam  Constitutional: tired appearing, lying comfortably in bed, tearful at times CV: Regular rate and rhythm, systolic murmur Chest: chest wall tender to palpation. Clear to auscultation bilaterally, no wheezes or crackles GI: Soft, non-distended, tender to palpation in epigastric region, bilateral lower quadrants.  MSK: No lower extremity edema bilaterally.    Pertinent Labs, Studies, and Procedures:      Latest Ref Rng & Units 03/02/2022    3:39 AM 03/01/2022    1:37 AM 12/25/2021   11:19 AM  CBC  WBC 4.0 - 10.5 K/uL 7.5  7.9  8.5   Hemoglobin 12.0 - 15.0 g/dL 12.9  13.8  14.9   Hematocrit 36.0 - 46.0 % 37.4  40.6  43.4   Platelets 150 - 400 K/uL 333  352  326       Latest Ref Rng & Units 03/02/2022    3:39 AM 03/01/2022    1:37 AM 01/17/2022    3:12 PM  CMP  Glucose 70 - 99 mg/dL 137  137  107   BUN 6 - 20 mg/dL 7  7  11    Creatinine 0.44 - 1.00 mg/dL 0.84  0.80  1.20   Sodium 135 - 145 mmol/L 138  133  141   Potassium 3.5 - 5.1 mmol/L 3.4  2.8  3.7   Chloride 98 - 111 mmol/L 104  95  96   CO2 22 - 32 mmol/L 26  27  25    Calcium 8.9 - 10.3 mg/dL 8.6  8.9  10.1   Total Protein 6.5 - 8.1 g/dL 6.0  7.2    Total Bilirubin 0.3 - 1.2 mg/dL 0.5  0.5    Alkaline Phos 38 - 126 U/L 94  115    AST 15 - 41 U/L 21  25    ALT 0 - 44 U/L 33  42     Lipase     Component Value Date/Time   LIPASE 35 03/01/2022 2046   Lipid Panel     Component Value Date/Time   CHOL 185 03/01/2022 0339   TRIG 221 (H) 03/01/2022 0339   HDL 36 (L) 03/01/2022 0339   CHOLHDL 5.1 03/01/2022 0339   VLDL 44 (H) 03/01/2022 0339   LDLCALC 105 (H) 03/01/2022 0339   Cardiac Panel (last 3 results) Recent Labs    03/01/22 0137 03/01/22 0338  TROPONINIHS 10 9   Magnesium:  1.6 Phosphorous: 2.9   CT chest abdomen pelvis 03/01/22  IMPRESSION: 1. No acute intrathoracic or intra-abdominal injury. 2. Mild coronary artery calcification. 3. 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 4. Stable 3.1 cm infrarenal abdominal aortic aneurysm. Recommend follow-up ultrasound every 3 years. This recommendation follows ACR consensus guidelines: White Paper of the ACR Incidental Findings Committee II on Vascular Findings. J Am Coll Radiol 2013; 10:789-794. 5. Submucosal fatty infiltration involving the terminal ileum in keeping with changes of remote or recurrent inflammation. 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 No evidence of obstruction or perforation. 6. Aortic atherosclerosis.   Discharge Instructions: Discharge Instructions     Call MD for:  difficulty breathing, headache or visual disturbances   Complete by: As directed    Call MD for:  persistant dizziness or light-headedness   Complete by: As directed    Call MD for:  severe uncontrolled pain   Complete by: As directed    Call MD for:  temperature >100.4   Complete by: As directed    Diet general   Complete by: As directed    Discharge instructions   Complete by: As directed    Dear Ms. Binning,    It was a pleasure taking care of you. You were admitted to the hospital for workup of chest pain. You did not have a heart attack. Please go to your appointment with Dr. Glori Bickers on 03/06/19 at 1:40 PM.    We also obtained several tests to continue evaluating your chronic diarrhea. You will follow up on these results with your stomach doctor. They should contact you when they receive the results, and you have an appointment with Christine Erp, PA at [facility] on 03/30/22 at 1:30 PM. We obtained a CT scan while you were here, and they will be able to  discuss these results with you at that appointment. You can increase the amount of Lomitil you take: you can take 2 tablets up to four times a day, as needed, for diarrhea.    Please STOP taking Losartan and Spironolactone and discuss whether to resume these medications with Dr. Haroldine Laws on 11/20. Otherwise, please continue taking all of your medications as usual.   We also recommend you make a hospital follow up appointment with your primary care physician in the next couple of weeks.   Take care!   Increase activity slowly   Complete by: As directed       Dear Ms. Koren,   It was a pleasure taking care of you. You were admitted to the hospital for workup of chest pain. You did not have a heart attack. Please go to your appointment with cardiologist Dr. Glori Bickers on 03/06/19 at 1:40 PM.   We also performed several tests to expedite the workup for your chronic diarrhea. The CT scan showed evidence of Crohn's disease. You will discuss these results in greater detail during a follow-up visit with your gastroenterologist. In the meantime, we have prescribed a medication called budesonide. You have an appointment with Christine Erp, PA at [facility] on 03/30/22 at 1:30 PM.  Please STOP taking Losartan and Spironolactone and discuss whether to resume these medications with Dr. Haroldine Laws on 11/20. Otherwise, please continue taking all of your medications as usual.    Signed: Serita Butcher, MD 03/02/2022, 4:48 PM

## 2022-03-02 NOTE — Progress Notes (Signed)
Initial Nutrition Assessment  DOCUMENTATION CODES:   Not applicable  INTERVENTION:  - Add Ensure Enlive po BID, each supplement provides 350 kcal and 20 grams of protein.   - Add Banatrol BID.  NUTRITION DIAGNOSIS:   Altered GI function related to diarrhea as evidenced by per patient/family report, energy intake < 75% for > or equal to 3 months.  GOAL:   Patient will meet greater than or equal to 90% of their needs  MONITOR:   PO intake, Supplement acceptance  REASON FOR ASSESSMENT:   Malnutrition Screening Tool    ASSESSMENT:   56 y.o. female admits related to left sided rib pain. PMH includes: CAD, chronic diarrhea, OSA, ADHD, anxiety/depression. Pt is currently receiving medical management for hypokalemia.  Meds include: Klor-con. Labs reviewed: K low.   The pt reports that her appetite is currently fair. She states that she has been having a poor appetite PTA since May 2023 due to constant diarrhea and nausea. Wt stable per record. RD will add banatrol BID in setting of diarrhea. Will also add Ensure BID for now for supplementation in setting of poor to fair PO. Will continue to monitor PO intakes.   NUTRITION - FOCUSED PHYSICAL EXAM:  RD working remotely, will attempt at f/u.   Diet Order:   Diet Order             Diet Heart Room service appropriate? Yes; Fluid consistency: Thin  Diet effective now                   EDUCATION NEEDS:   Education needs have been addressed  Skin:  Skin Assessment: Reviewed RN Assessment  Last BM:  03/02/22  Height:   Ht Readings from Last 1 Encounters:  03/01/22 5' 10"  (1.778 m)    Weight:   Wt Readings from Last 1 Encounters:  03/01/22 86.2 kg    Ideal Body Weight:  68.2 kg  BMI:  Body mass index is 27.26 kg/m.  Estimated Nutritional Needs:   Kcal:  2155-2585 kcals  Protein:  105-130 gm  Fluid:  >/= 2.1 L  Thalia Bloodgood, RD, LDN, CNSC.

## 2022-03-02 NOTE — Progress Notes (Signed)
Internal Medicine Attending:   I saw and examined the patient. I reviewed the resident's H&P note and I agree with the resident's findings and plan as documented in the resident's note.  In brief, patient is a 56 year old female with past medical history of ischemic cardiomyopathy with an EF of 15% status post ICD placement, CAD, chronic diarrhea, OSA, LV thrombus on anticoagulation, ADHD, anxiety/depression who presented to the ED for atypical chest pain.  Patient also complains of persistent diarrhea which has been ongoing since April of this year.  Patient states that her chest pain has resolved but she now has epigastric pain radiating to the back between her shoulder blades.  She states that she is concerned about going home since she is unable to do daily activities like shopping or going out because she is afraid of having to use the bathroom.  Vitals:   03/02/22 0846 03/02/22 0854  BP:  (!) 102/53  Pulse: 73 70  Resp:  18  Temp:  97.9 F (36.6 C)  SpO2:  96%   On exam, patient is lying in bed in no apparent distress.  Lungs are clear to auscultation bilaterally.  Cardiovascular exam reveals regular rate and rhythm with normal heart sounds.  Abdomen is soft, mild epigastric tenderness noted to palpation, nondistended, normoactive bowel sounds.  Lower extremities with no edema noted and are nontender to palpation.  Patient's mood appears anxious and she is frustrated by the persistence of her diarrhea symptoms and current pain.  Assessment and plan:  1.  Atypical chest pain: -Patient presented to the ED with atypical chest pain which is since resolved.  She does have a history of ischemic cardiomyopathy and CAD but EKG here was unchanged and troponins were within normal limits. -Cardiology evaluated the patient do not believe that this is cardiac pain -I suspect that her pain secondary to her ongoing abdominal issues including her chronic diarrhea and likely acute on chronic  ileitis. -Continue with current cardiac medications including Farxiga, digoxin, ivabradine, atorvastatin and metoprolol -Given patient's decreased oral intake and persistent diarrhea we will continue to hold Lasix today.  If patient's oral intake is improved we will resume Lasix.  Patient's losartan and spironolactone are also on hold secondary to soft blood pressures (systolics in the 91M to 384Y) -Of note, patient also has a history of an LV thrombus which was again noted on her CT chest.  We will continue with Eliquis 5 mg twice daily for this.  2.  Chronic diarrhea with severe hypokalemia: -Patient was noted to have a potassium of 2.8 on admission and complained of recurrent diarrhea up to 3 loose stools per day.  Patient has been following up with GI for her chronic diarrhea and recent underwent an EGD as well as a colonoscopy.  Colonoscopy did show stricture of the terminal ileitis with ulceration and likely acute on chronic ileitis.  I suspect that her hypokalemia is secondary to her chronic diarrhea as well as Lasix use. -Potassium level is improved to 3.4 today.  We will continue to supplement potassium today -Patient also noted to have hypomagnesemia with a magnesium of 1.6.  We will supplement magnesium today as well -We will follow-up to lactoferrin, calprotectin as well as fecal elastase.  GI pathogen panel was sent as well. -Etiology behind her ileitis could be secondary to early onset Crohn's disease versus drug-induced versus infectious. -GI was consulted here for further evaluation -We will continue with Lomotil as needed -We will monitor oral intake as  well. -No further work-up at this time -If no further GI work-up is required at this time I suspect patient should be stable for DC home today.

## 2022-03-02 NOTE — Progress Notes (Signed)
Mobility Specialist - Progress Note   03/02/22 1209  Mobility  Activity Ambulated independently in hallway  Level of Assistance Independent  Assistive Device None  Distance Ambulated (ft) 550 ft  Activity Response Tolerated well  Mobility Referral No  $Mobility charge 1 Mobility    Pt received in bed agreeable to mobility. No complaints throughout, no physical assistance needed. Left in room w/ call bell in reach and all needs met.   [place] Specialist Please contact via SecureChat or Rehab office at [phone]

## 2022-03-02 NOTE — Hospital Course (Addendum)
Christine [name] is a 56 year old female with HFrEF (LVEF <20%) and CAD c/b LAD occlusion s/p ICD, chronic tobacco use,OSA on CPAP, history of LV thrombus on eliquis, and ADHD, anxiety/depression, and PTSD who presented with 1 day of bilateral chest pain and chronic diarrhea and developed 6/10 epigastric pain that radiated to her back while admitted.    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  She was found to have hypokalemia to 2.8, likely due to chronic diarrhea. We repleted potassium and magnesium. She takes potassium supplements at home. Patient is established with GI and had been undergoing workup prior to admission. Patient received colonoscopy on 11/13 showing terminal ileus stricture with ulcerations.  Surgical pathology showing acute ileitis could be consistent with medication versus infection versus emerging IBD.  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 Etiology behind her ileitis could be secondary to early onset Crohn's disease versus drug-induced versus infectious, GI consulted for further evaluation. Per GI, patient received CT enterography to specifically evaluate for any evidence of small bowel inflammatory bowel disease as her colonoscopy that was done earlier this week showed evidence of acute ileal inflammation on biopsy. She will follow up with [facility] outpatient. Until then, GI has recommended to use lomotil 2 tablets up to 4 times daily.  Chest tightness Epigastric pain Initially presented with 2 hours of squeezing pain under her ribs radiating to her back that felt similar to prior MI pain.  EKG without acute changes and troponins negative x2. Chest pain resolved prior to admission, however patient also developed similar pain in the epigastric region that radiated to her back.  Lipase  was elevated to 140 on admission, but normalized to 35 and there was no evidence of pancreatitis on CT.  Pain responded to maalox x1, but then returned and did not respond to another dose of maalox. Pain initially resolved but returned after one dose of dilauded, and subsequently did not respond to acetaminophen or toradol.  Cardiology consulted, they felt that chest tightness and abdominal pain was not cardiac in nature.   Ischemic cardiomyopathy CAD Most recent echo 04/2021 revealed LVEF less than 20% with grade 3 diastolic dysfunction. She follows in the heart failure clinic and has an ICD in place. Patient had missed 3 days of lasix 2/2 diarrhea, appeared euvolemic during admission but stated she could feel her fluid in her fingers.She endorsed shortness of breath on day 2 of admission and received one dose of oral lasix, BNP was 65.2. Patient's losartan and spironolactone were held during admission and *** resumed on dc.    History of LV thrombus Again noted on CT chest. No acute signs of bleeding - continue eliquis 5 mg twice daily      Ascending aortic aneurysm: Abdominal aortic aneurysm: Ascending aorta 4.0 cm and ascending abdominal aorta 3.1 cm again present on CT. Patient has minimized her risk with tobacco cessation and BP control, but is recommended to receive follow-up ultrasound every 3 years.  Anxiety/ Depression Continued patient's home regimen of xanax 26m tid and ambien 5-112mat night. Concerned that this may be not adequately treating her symptoms and carries high risk, however, felt that not appropriate to address during brief hospitalization.

## 2022-03-03 LAB — LACTOFERRIN, FECAL, QUALITATIVE: Lactoferrin, Fecal, Qual: NEGATIVE

## 2022-03-03 LAB — GASTROINTESTINAL PANEL BY PCR, STOOL (REPLACES STOOL CULTURE)

## 2022-03-05 ENCOUNTER — Encounter (HOSPITAL_COMMUNITY): Payer: Self-pay | Admitting: Internal Medicine

## 2022-03-05 ENCOUNTER — Telehealth: Payer: Self-pay

## 2022-03-05 ENCOUNTER — Ambulatory Visit (HOSPITAL_COMMUNITY)
Admission: RE | Admit: 2022-03-05 | Discharge: 2022-03-05 | Disposition: A | Discharge status: Home / Self Care | Payer: Medicaid Other | Source: Ambulatory Visit | Attending: Internal Medicine | Admitting: Internal Medicine

## 2022-03-05 ENCOUNTER — Other Ambulatory Visit (HOSPITAL_COMMUNITY): Payer: Self-pay

## 2022-03-05 VITALS — BP 102/62 | HR 68 | Wt 188.0 lb

## 2022-03-05 DIAGNOSIS — F32A Depression, unspecified: Secondary | ICD-10-CM | POA: Diagnosis not present

## 2022-03-05 DIAGNOSIS — I5020 Unspecified systolic (congestive) heart failure: Secondary | ICD-10-CM

## 2022-03-05 DIAGNOSIS — I714 Abdominal aortic aneurysm, without rupture, unspecified: Secondary | ICD-10-CM | POA: Diagnosis not present

## 2022-03-05 DIAGNOSIS — Z7901 Long term (current) use of anticoagulants: Secondary | ICD-10-CM | POA: Diagnosis not present

## 2022-03-05 DIAGNOSIS — I513 Intracardiac thrombosis, not elsewhere classified: Secondary | ICD-10-CM | POA: Insufficient documentation

## 2022-03-05 DIAGNOSIS — I110 Hypertensive heart disease with heart failure: Secondary | ICD-10-CM | POA: Diagnosis not present

## 2022-03-05 DIAGNOSIS — F419 Anxiety disorder, unspecified: Secondary | ICD-10-CM | POA: Diagnosis not present

## 2022-03-05 DIAGNOSIS — I509 Heart failure, unspecified: Secondary | ICD-10-CM

## 2022-03-05 DIAGNOSIS — I2510 Atherosclerotic heart disease of native coronary artery without angina pectoris: Secondary | ICD-10-CM | POA: Diagnosis not present

## 2022-03-05 DIAGNOSIS — R197 Diarrhea, unspecified: Secondary | ICD-10-CM | POA: Diagnosis not present

## 2022-03-05 DIAGNOSIS — Z720 Tobacco use: Secondary | ICD-10-CM | POA: Insufficient documentation

## 2022-03-05 DIAGNOSIS — D751 Secondary polycythemia: Secondary | ICD-10-CM | POA: Diagnosis not present

## 2022-03-05 DIAGNOSIS — R002 Palpitations: Secondary | ICD-10-CM | POA: Diagnosis not present

## 2022-03-05 DIAGNOSIS — Z95810 Presence of automatic (implantable) cardiac defibrillator: Secondary | ICD-10-CM

## 2022-03-05 DIAGNOSIS — Z79899 Other long term (current) drug therapy: Secondary | ICD-10-CM | POA: Insufficient documentation

## 2022-03-05 DIAGNOSIS — I5022 Chronic systolic (congestive) heart failure: Secondary | ICD-10-CM | POA: Diagnosis not present

## 2022-03-05 DIAGNOSIS — G4733 Obstructive sleep apnea (adult) (pediatric): Secondary | ICD-10-CM | POA: Diagnosis not present

## 2022-03-05 DIAGNOSIS — I959 Hypotension, unspecified: Secondary | ICD-10-CM | POA: Insufficient documentation

## 2022-03-05 MED ORDER — SPIRONOLACTONE 25 MG PO TABS
12.5000 mg | ORAL_TABLET | Freq: Every day | ORAL | 3 refills | Status: DC
  Filled 2022-03-05 – 2022-03-13 (×2): qty 45, 90d supply, fill #0

## 2022-03-05 NOTE — Addendum Note (Signed)
Encounter addended by: Jerl Mina, RN on: 03/05/2022 2:33 PM  Actions taken: Pharmacy for encounter modified, Order list changed, Clinical Note Signed

## 2022-03-05 NOTE — Patient Outreach (Signed)
Transition Care Management Unsuccessful Follow-up Telephone Call  Date of discharge and from where:  03/02/22 [facility]  Attempts:  1st Attempt  Reason for unsuccessful TCM follow-up call:  Voice mail full  Mickel Fuchs, [place], [place] Medicaid Team  [phone]

## 2022-03-05 NOTE — Progress Notes (Addendum)
Advanced Heart Failure Clinic Note   PCP: [facility]: Dr Oval Linsey HF Cardiologist: Dr Haroldine Laws  HPI: Christine [name] is a 56 y.o.female with history of chronic tobacco use, ADHD, anxiety, depression, CAD,  systolic HF   Presented to ED 01/20/21 with increased shortness of breath/tachycardia. Adderrall stopped. Echo with EF 20-25%,  LHC/RHC with single vessel CAD (occluded mLAD) elevated filling pressures and  moderately reduced CO. Digoxin added. Beta blocker stopped.  Discharged to home 01/25/21. Discharge weight 213 pounds.   Echo 05/11/21 EF < 20% LV severely dilated RV ok Mild MR. Referred for ICD.  CPX with very mild HF limitation with elevated Ve/VCO2 slope  S/p [employer] ICD 4/23.  Saw PCP 08/10/21 for dizziness and anxiety. Felt insomnia related, labs unremarkable.  Seen in ED 08/18/21 with sudden onset SOB. CXR and EKG re-assuring, Trop x 2 normal. BNP mildly up, advised admission to further work up. She declined, as symptoms completely resolved, and was elected to discharge home.  Seen in ED 10/06/21 for urinary retention, required foley. Felt to be 2/2 to prazosin and this was stopped.  Admitted last week (03/01/22) with CP and chronic diarrhea. Hstrop negative. Felt to have Crohn's disease. Losartan and spiro stopped due to low BP.  Continues with watery diarrhea and nausea. No CP, orthopnea or PND. SOB with mild activity. Having palpitations. Currently wearing Zio    Cardiac Testing  - Echo 05/11/21 EF < 20% LV severely dilated RV ok Mild MR   - Echo (10/22): EF 20-25% LV severely dilated, LV apical mural thrombus, RV okay, moderate MR.  - R/LHC (10/22): w/ severe 1v CAD with occlusion of mid LAD., PCW 27, CO 4.7, CI 2.2    - cMRI (10/22): demonstrated subendocardial LGE consistent with prior infarcts in LV basal inferolateral wall, apical anterior/septal/inferior walls and apex. LVEF 22%.  - CPX 3/23: FVC 3.60 (85%)      FEV1 2.89 (87%)         FEV1/FVC 80 (100%)        MVV 171 (157%)   Resting HR: 113 Standing HR: 113 Peak HR: 149   (89 % age predicted max HR)  BP rest: 98/60 Standing BP: 90/62 BP peak: 134/58  Peak VO2: 18.8 (90% predicted peak VO2)  VE/VCO2 slope:  33 Peak RER: 1.14  VE/MVV:  38% O2pulse:  11   (100 % predicted O2pulse)   ROS: All systems negative except as listed in HPI, PMH and Problem List.  SH:  Social History   Socioeconomic History   Marital status: Single    Spouse name: Not on file   Number of children: 0   Years of education: 16   Highest education level: Not on file  Occupational History   Not on file  Tobacco Use   Smoking status: Former    Packs/day: 0.50    Types: Cigarettes    Quit date: 01/15/2021    Years since quitting: 1.1   Smokeless tobacco: Never  Vaping Use   Vaping Use: Never used  Substance and Sexual Activity   Alcohol use: Yes    Comment: rarely   Drug use: Never   Sexual activity: Not on file  Other Topics Concern   Not on file  Social History Narrative   Not on file   Social Determinants of Health   Financial Resource Strain: Medium Risk (01/31/2021)   Overall Financial Resource Strain (CARDIA)    Difficulty of Paying Living Expenses: Somewhat hard  Food Insecurity: No Food Insecurity (01/31/2021)   Hunger Vital Sign    Worried About Running Out of Food in the Last Year: Never true    Ran Out of Food in the Last Year: Never true  Transportation Needs: No Transportation Needs (01/31/2021)   PRAPARE - Hydrologist (Medical): No    Lack of Transportation (Non-Medical): No  Physical Activity: Not on file  Stress: Not on file  Social Connections: Not on file  Intimate Partner Violence: Not on file   FH:  Family History  Problem Relation Age of Onset   Stroke Mother    Atrial fibrillation Mother    Heart failure Father    Stomach cancer Maternal Grandfather    Liver cancer Neg Hx    Esophageal cancer Neg Hx     Colon polyps Neg Hx    Colon cancer Neg Hx    Past Medical History:  Diagnosis Date   Allergy    Anxiety 01/20/2021   CAD in native artery 08/24/2021   CHF (congestive heart failure) (HCC)    Chronic combined systolic and diastolic heart failure ([place]) 78/93/8101   Complication of anesthesia    Depression    Hyperlipidemia    Myocardial infarction ([place])    Presence of cardiac defibrillator 07/2021   Shortness of breath 01/20/2021   Sleep apnea 07/2021   Tobacco abuse 01/20/2021   Current Outpatient Medications  Medication Sig Dispense Refill   acetaminophen (TYLENOL) 500 MG tablet Take 1,000 mg by mouth 3 (three) times daily as needed for moderate pain or headache.     ALPRAZolam (XANAX) 1 MG tablet Take 2 mg by mouth 3 (three) times daily.     alum & mag hydroxide-simeth (MAALOX/MYLANTA) 200-200-20 MG/5ML suspension Take 30 mLs by mouth every 4 (four) hours as needed for indigestion or heartburn. 355 mL 0   apixaban (ELIQUIS) 5 MG TABS tablet Take 1 tablet (5 mg total) by mouth 2 (two) times daily. 60 tablet 7   atorvastatin (LIPITOR) 80 MG tablet Take 1 tablet (80 mg total) by mouth daily. 90 tablet 3   budesonide (ENTOCORT EC) 3 MG 24 hr capsule Take 3 capsules (9 mg total) by mouth daily. 168 capsule 0   cyclobenzaprine (FLEXERIL) 10 MG tablet Take 20 mg by mouth at bedtime.     dapagliflozin propanediol (FARXIGA) 10 MG TABS tablet Take 1 tablet (10 mg total) by mouth daily. 30 tablet 11   digoxin (LANOXIN) 0.125 MG tablet Take 1 tablet (0.125 mg total) by mouth daily. 30 tablet 3   diphenhydrAMINE (BENADRYL) 25 MG tablet Take 50 mg by mouth as needed for allergies.     diphenoxylate-atropine (LOMOTIL) 2.5-0.025 MG tablet Take 2 tablets by mouth 4 (four) times daily as needed for diarrhea or loose stools. 56 tablet 0   feeding supplement (ENSURE ENLIVE / ENSURE PLUS) LIQD Take 237 mLs by mouth 2 (two) times daily between meals. 237 mL 12   furosemide (LASIX) 80 MG tablet Take 1  tablet (80 mg total) by mouth daily as needed. As needed for weight gain (3 pounds in one day or 5 pounds in one week) or worsening edema. 30 tablet 0   ivabradine (CORLANOR) 5 MG TABS tablet Take 5 mg by mouth 2 (two) times daily.     metoprolol succinate (TOPROL-XL) 25 MG 24 hr tablet Take 1 tablet (25 mg total) by mouth daily. Take with or immediately following a meal. 30 tablet 11  metroNIDAZOLE (METROGEL) 1 % gel Apply topically as needed.     Multiple Vitamins-Minerals (MULTIVITAMIN WITH MINERALS) tablet Take 1 tablet by mouth daily.     oxymetazoline (AFRIN) 0.05 % nasal spray Place 1-2 sprays into both nostrils 2 (two) times daily as needed for congestion.     Polyethyl Glycol-Propyl Glycol (SYSTANE OP) Place 2 drops into the right eye as needed (irritation).     potassium chloride SA (KLOR-CON M) 20 MEQ tablet Take 2 tablets (40 mEq total) by mouth 2 (two) times daily. 120 tablet 1   promethazine (PHENERGAN) 25 MG tablet Take 1 tablet (25 mg total) by mouth every 6 (six) to 8 (eight) hours for nausea or vomiting. (Patient taking differently: Take 25 mg by mouth in the morning, at noon, and at bedtime.) 90 tablet 2   zolpidem (AMBIEN) 10 MG tablet Take 5-10 mg by mouth at bedtime.     No current facility-administered medications for this encounter.   BP 102/62   Pulse 68   Wt 85.3 kg (188 lb)   SpO2 98%   BMI 26.98 kg/m   Wt Readings from Last 3 Encounters:  03/05/22 85.3 kg (188 lb)  03/01/22 86.2 kg (190 lb)  02/26/22 85.3 kg (188 lb)   PHYSICAL EXAM: General:  Well appearing. No resp difficulty HEENT: normal Neck: supple. no JVD. Carotids 2+ bilat; no bruits. No lymphadenopathy or thryomegaly appreciated. Cor: PMI nondisplaced. Regular rate & rhythm. No rubs, gallops or murmurs. Lungs: clear Abdomen: soft, nontender, nondistended. No hepatosplenomegaly. No bruits or masses. Good bowel sounds. Extremities: no cyanosis, clubbing, rash, edema Neuro: alert & orientedx3,  cranial nerves grossly intact. moves all 4 extremities w/o difficulty. Affect pleasant  ICD interrogation (personally reviewed): HL Score 11, Fluid trending up. No VT. Activity level 1.6 hr Personally reviewed   ASSESSMENT & PLAN: 1. 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 Echo EF 20-25%, severely dilated LV, RV okay, moderate MR - R/LHC (10/22): 1v CAD occluded mid LAD PCWP 27,  Fick 4.7/2.  - cMRI EF 22% subendocardial LGE consistent with prior infarcts in LV basal inferolateral wall, apical anterior/septal/inferior walls and apex. No viability. RV okay. Not sure how to explain inferior defects on cMRI  - Echo (05/11/21) EF < 20% RV ok - CPX 1/23 with very mild HF limitation:  Peak VO2: 18.8 (90% predicted peak VO2) VE/VCO2 slope: 33 Peak RER: 1.14  - s/p [employer] ICD 4/23. - Stable NYHA III. Volume creeping up on HL. Currently on lasix 80 bid. Can take extra as needed - Continue Ivabradine 5 mg bid.  - Continue Toprol XL 25 mg daily. - Off losartan and spiro due to low BP - Restart spiro 12.5 mg daily. Continue to hold losartan for now  - Continue digoxin 0.125 mg daily. Last dig level 0.5 (09/20/21) - Continue Farxiga 10 mg daily.  - Needs to remain off adderall with reduced EF. - Recent labs OK   2. Palpitations - No longer on Adderall. Drinks 1 Pepsi a day  - Zio 14-day (11/22) showed mostly SR, no high-grade arrhythmias. - Currently wearing a zio again   3. CAD - Single vessel LAD occlusion on LHC. - No s/s ischemia - Continue statin. - No ASA with Eliquis.   4. Tobacco use - Remains quit from cigarettes.  5. LV apical thrombus - Noted on echo/cMRI.  - On Eliquis. No bleeding issues.   6. Diarrhea  - Following with GI. Felt to  have Crohn's disease  7. OSA - Mild on sleep study, AHI 11.8 - Struggling with CPAP.  - Follows with Dr. Radford Pax, referred to sleep dentistry to discuss oral device.  8. Polycythemia - Follows with Heme/Onc. - JAK  negative  9. AAA - CT scan 11/23  3.1cm - f/u u/s in 3 years recommended   Glori Bickers MD 2:25 PM

## 2022-03-05 NOTE — Patient Instructions (Signed)
START Spironolactone 12.58m (1/2 Tab) Daily.  Your physician recommends that you schedule a follow-up appointment in: 1 month  If you have any questions or concerns before your next appointment please send [place] message through [place] call our office at [phone]    TO LEAVE A MESSAGE FOR THE NURSE SELECT OPTION 2, PLEASE LEAVE A MESSAGE INCLUDING: YOUR NAME DATE OF BIRTH CALL BACK NUMBER REASON FOR CALL**this is important as we prioritize the call backs  YOU WILL RECEIVE A CALL BACK THE SAME DAY AS LONG AS YOU CALL BEFORE 4:00 PM  [facility] the [facility] you and your health needs are our priority. As part of our continuing mission to provide you with exceptional heart care, we have created designated Provider Care Teams. These Care Teams include your primary Cardiologist (physician) and Advanced Practice Providers (APPs- Physician Assistants and Nurse Practitioners) who all work together to provide you with the care you need, when you need it.   You may see any of the following providers on your designated Care Team at your next follow up: Dr DGlori BickersDr DLoralie ChampagneDr. ARoxana Hires NP BLyda Jester PUtahJEmanuel [facility], IncLFarwell PUtahAForestine Na NP LAudry Riles PharmD   Please be sure to bring in all your medications bottles to every appointment.

## 2022-03-06 ENCOUNTER — Other Ambulatory Visit (HOSPITAL_COMMUNITY): Payer: Self-pay

## 2022-03-06 ENCOUNTER — Telehealth: Payer: Self-pay

## 2022-03-06 NOTE — Telephone Encounter (Signed)
-----   Message from Waldo, DO sent at 03/02/2022  4:40 PM EST ----- Thanks Ulice Dash. Appreciate you taking care of her during her admission and getting the CTE done! Agree this looks like new dx of stricturing ileal Crohns.   Mickel Baas, can you please check in on her on Monday and see how she is doing. TO continue with the entocort as prescribed. She has f/u scheduled with Anderson Malta, but can look to get her appt with me instead if I have an opening sooner.   Thanks.  VC ----- Message ----- From: Jerene Bears, MD Sent: 03/02/2022   4:31 PM EST To: Rodriguez Camp Your pt briefly hospitalized with CP.  I went ahead with the CT entero and here are the findings.  Also collected fecal cal which is pending. I rec'ed dc home with entocort 9 mg and then followup with you.   You can decide which other therapy to go with. She will need followup if not scheduled. Thanks [employer]

## 2022-03-06 NOTE — Telephone Encounter (Signed)
Spoke with pt. Pt reports she has had alternating constipation and diarrhea and when she has diarrhea she has no control of bowels. Pt also has nausea. Overall pt states she is feeling slightly better. Pt scheduled for f/u on 11/28 at 2:40 pm with Dr. Bryan Lemma.

## 2022-03-09 LAB — CALPROTECTIN, FECAL: Calprotectin, Fecal: <5 ug/g (ref 0–120)

## 2022-03-13 ENCOUNTER — Other Ambulatory Visit: Payer: Self-pay | Admitting: Internal Medicine

## 2022-03-13 ENCOUNTER — Other Ambulatory Visit (HOSPITAL_COMMUNITY): Payer: Self-pay

## 2022-03-13 ENCOUNTER — Ambulatory Visit (INDEPENDENT_AMBULATORY_CARE_PROVIDER_SITE_OTHER): Payer: Medicaid Other | Admitting: Gastroenterology

## 2022-03-13 ENCOUNTER — Encounter: Payer: Self-pay | Admitting: Gastroenterology

## 2022-03-13 ENCOUNTER — Other Ambulatory Visit (INDEPENDENT_AMBULATORY_CARE_PROVIDER_SITE_OTHER): Payer: Medicaid Other

## 2022-03-13 VITALS — BP 100/64 | HR 62 | Ht 70.0 in | Wt 184.6 lb

## 2022-03-13 DIAGNOSIS — R197 Diarrhea, unspecified: Secondary | ICD-10-CM | POA: Diagnosis not present

## 2022-03-13 DIAGNOSIS — I5042 Chronic combined systolic (congestive) and diastolic (congestive) heart failure: Secondary | ICD-10-CM

## 2022-03-13 DIAGNOSIS — I2510 Atherosclerotic heart disease of native coronary artery without angina pectoris: Secondary | ICD-10-CM

## 2022-03-13 DIAGNOSIS — K50119 Crohn's disease of large intestine with unspecified complications: Secondary | ICD-10-CM

## 2022-03-13 DIAGNOSIS — I5020 Unspecified systolic (congestive) heart failure: Secondary | ICD-10-CM

## 2022-03-13 DIAGNOSIS — K8681 Exocrine pancreatic insufficiency: Secondary | ICD-10-CM

## 2022-03-13 DIAGNOSIS — I513 Intracardiac thrombosis, not elsewhere classified: Secondary | ICD-10-CM | POA: Diagnosis not present

## 2022-03-13 LAB — SEDIMENTATION RATE: Sed Rate: 52 mm/hr — ABNORMAL HIGH (ref 0–30)

## 2022-03-13 LAB — C-REACTIVE PROTEIN: CRP: <1 mg/dL (ref 0.5–20.0)

## 2022-03-13 LAB — PANCREATIC ELASTASE, FECAL: Pancreatic Elastase-1, Stool: <50 ug Elast./g — ABNORMAL LOW (ref >200)

## 2022-03-13 MED ORDER — PANCRELIPASE (LIP-PROT-AMYL) 36000-114000 UNITS PO CPEP
ORAL_CAPSULE | ORAL | 5 refills | Status: DC
  Filled 2022-03-13: qty 300, 37d supply, fill #0
  Filled 2022-04-16 – 2022-04-24 (×2): qty 300, 37d supply, fill #1
  Filled 2022-06-06: qty 300, 37d supply, fill #2
  Filled 2022-10-10: qty 300, 37d supply, fill #3
  Filled 2022-11-16: qty 300, 37d supply, fill #4
  Filled 2023-02-02: qty 300, 37d supply, fill #5

## 2022-03-13 NOTE — Patient Instructions (Addendum)
Your provider has requested that you go to the basement level for lab work before leaving today. Press "B" on the elevator. The lab is located at the first door on the left as you exit the elevator.   We have sent the following medications to your pharmacy for you to pick up at your convenience: Creon  If you are age 54 or older, your body mass index should be between 23-30. Your Body mass index is 26.49 kg/m. If this is out of the aforementioned range listed, please consider follow up with your Primary Care Provider.  If you are age 72 or younger, your body mass index should be between 19-25. Your Body mass index is 26.49 kg/m. If this is out of the aformentioned range listed, please consider follow up with your Primary Care Provider.   __________________________________________________________  The  GI providers would like to encourage you to use [facility] to communicate with providers for non-urgent requests or questions.  Due to long hold times on the telephone, sending your provider a message by [facility] may be a faster and more efficient way to get a response.  Please allow 48 business hours for a response.  Please remember that this is for non-urgent requests.   Due to recent changes in healthcare laws, you may see the results of your imaging and laboratory studies on MyChart before your provider has had a chance to review them.  We understand that in some cases there may be results that are confusing or concerning to you. Not all laboratory results come back in the same time frame and the provider may be waiting for multiple results in order to interpret others.  Please give [place] 48 hours in order for your provider to thoroughly review all the results before contacting the office for clarification of your results.     Thank you for choosing me and [facility].  Vito Cirigliano, D.O.

## 2022-03-13 NOTE — Progress Notes (Unsigned)
Chief Complaint:    Crohn's Disease, ileal stricture  GI History: Ms. Yore is a 56 year old female with HFrEF (LVEF <20%) and CAD c/b LAD occlusion s/p ICD, chronic tobacco use,OSA on CPAP, history of LV thrombus on eliquis, and ADHD, anxiety/depression, and PTSD follows in the GI clinic for the following:  1) Diarrhea.  Started with watery diarrhea in 08/2021.  Index symptoms of 8-10 loose, watery stools with fecal urgency.  Not much improvement with Imodium.  No nocturnal symptoms.  Loss 6# with decreased p.o. intake. - 11/28/2021: ER evaluation for diarrhea.  WBC 13.3, ALT 56, ALP 127, otherwise unremarkable work-up.  Was given Keflex to treat UTI - 11/29/2021: Negative/normal GI PCR panel, O&P - 12/14/2021: Initial appointment in the GI clinic - 12/21/2021: CT A/P: Moderate colonic stool burden but otherwise no intra-abdominal pathology - 02/26/2022: EGD: Normal - 02/26/2022: Colonoscopy: 3 mm sigmoid HP, sigmoid diverticulosis, otherwise normal colon (path benign).  Terminal ileum with moderate stenosis measuring 1 cm in length by 5 mm in diameter which was not traversable.  This was located 3 cm from the ileocecal valve.  Terminal ileum contained a few ulcers proximal and distal to stricture (path: Acute ileitis with ulceration)  HPI:     Patient is a 56 y.o. female presenting to the Gastroenterology Clinic for hospital follow-up.   She was admitted 11/16-17 with chest tightness and epigastric pain.  EKG and troponins unremarkable.  Lipase 140 --> 35 and no evidence of pancreatitis on CT.  Inpatient GI service was consulted to comment on her ongoing chronic diarrhea. - CT C/A/P: No acute intra-abdominal pathology.  Stable 3.1 cm infrarenal abdominal aortic aneurysm.  Submucosal fatty infiltration in the terminal ileum with superimposed mucosal hyperemia suspicious for mild, acute on chronic terminal ileitis - CT enterography: Ileitis throughout a 15 cm segment of ileum with 2 segments of  luminal narrowing/stricturing through this segment of distal ileum, c/w stricturing, ileal Crohn's Disease. - Negative/normal lactoferrin, calprotectin, repeat GI PCR panel - Fecal pancreatic elastase <50 - ESR, CRP never checked - Was started on Entocort 9 mg/day x8 weeks with Lomotil as needed  She reports overall decreased stool frequency. No BM for 3 days, and now with fecal seepage and looser stools again today.  Otherwise good appetite.  She does not take any NSAIDs.  Review of systems:     No chest pain, no SOB, no fevers, no urinary sx   Past Medical History:  Diagnosis Date   Allergy    Anxiety 01/20/2021   CAD in native artery 08/24/2021   CHF (congestive heart failure) (HCC)    Chronic combined systolic and diastolic heart failure ([place]) 41/74/0814   Complication of anesthesia    Depression    Hyperlipidemia    Myocardial infarction [facility])    Presence of cardiac defibrillator 07/2021   Shortness of breath 01/20/2021   Sleep apnea 07/2021   Tobacco abuse 01/20/2021    Patient's surgical history, family medical history, social history, medications and allergies were all reviewed in Epic    Current Outpatient Medications  Medication Sig Dispense Refill   acetaminophen (TYLENOL) 500 MG tablet Take 1,000 mg by mouth 3 (three) times daily as needed for moderate pain or headache.     ALPRAZolam (XANAX) 1 MG tablet Take 2 mg by mouth 3 (three) times daily.     alum & mag hydroxide-simeth (MAALOX/MYLANTA) 200-200-20 MG/5ML suspension Take 30 mLs by mouth every 4 (four) hours as needed for indigestion or heartburn. [place]  mL 0   apixaban (ELIQUIS) 5 MG TABS tablet Take 1 tablet (5 mg total) by mouth 2 (two) times daily. 60 tablet 7   atorvastatin (LIPITOR) 80 MG tablet Take 1 tablet (80 mg total) by mouth daily. 90 tablet 3   budesonide (ENTOCORT EC) 3 MG 24 hr capsule Take 3 capsules (9 mg total) by mouth daily. 168 capsule 0   cyclobenzaprine (FLEXERIL) 10 MG tablet Take 20 mg  by mouth at bedtime.     dapagliflozin propanediol (FARXIGA) 10 MG TABS tablet Take 1 tablet (10 mg total) by mouth daily. 30 tablet 11   digoxin (LANOXIN) 0.125 MG tablet Take 1 tablet (0.125 mg total) by mouth daily. 30 tablet 3   diphenhydrAMINE (BENADRYL) 25 MG tablet Take 50 mg by mouth as needed for allergies.     diphenoxylate-atropine (LOMOTIL) 2.5-0.025 MG tablet Take 2 tablets by mouth 4 (four) times daily as needed for diarrhea or loose stools. 56 tablet 0   feeding supplement (ENSURE ENLIVE / ENSURE PLUS) LIQD Take 237 mLs by mouth 2 (two) times daily between meals. 237 mL 12   furosemide (LASIX) 80 MG tablet Take 1 tablet (80 mg total) by mouth daily as needed. As needed for weight gain (3 pounds in one day or 5 pounds in one week) or worsening edema. 30 tablet 0   ivabradine (CORLANOR) 5 MG TABS tablet Take 5 mg by mouth 2 (two) times daily.     metoprolol succinate (TOPROL-XL) 25 MG 24 hr tablet Take 1 tablet (25 mg total) by mouth daily. Take with or immediately following a meal. 30 tablet 11   metroNIDAZOLE (METROGEL) 1 % gel Apply topically as needed.     Multiple Vitamins-Minerals (MULTIVITAMIN WITH MINERALS) tablet Take 1 tablet by mouth daily.     oxymetazoline (AFRIN) 0.05 % nasal spray Place 1-2 sprays into both nostrils 2 (two) times daily as needed for congestion.     potassium chloride SA (KLOR-CON M) 20 MEQ tablet Take 2 tablets (40 mEq total) by mouth 2 (two) times daily. 120 tablet 1   promethazine (PHENERGAN) 25 MG tablet Take 1 tablet (25 mg total) by mouth every 6 (six) to 8 (eight) hours for nausea or vomiting. (Patient taking differently: Take 25 mg by mouth in the morning, at noon, and at bedtime.) 90 tablet 2   spironolactone (ALDACTONE) 25 MG tablet Take 0.5 tablets (12.5 mg total) by mouth daily. 45 tablet 3   zolpidem (AMBIEN) 10 MG tablet Take 5-10 mg by mouth at bedtime.     Polyethyl Glycol-Propyl Glycol (SYSTANE OP) Place 2 drops into the right eye as needed  (irritation). (Patient not taking: Reported on 03/13/2022)     No current facility-administered medications for this visit.    Physical Exam:     BP 100/64 (BP Location: Left Arm, Patient Position: Sitting)   Pulse 62   Ht _0  (1.778 m)   Wt 184 lb 9.6 oz (83.7 kg)   SpO2 96%   BMI 26.49 kg/m   GENERAL:  Pleasant female in NAD PSYCH: : Cooperative, normal affect Musculoskeletal:  Normal muscle tone, normal strength NEURO: Alert and oriented x 3, no focal neurologic deficits   IMPRESSION and PLAN:    1) Crohn's Disease 2) Diarrhea Colonoscopy with not traversable stricture distal ileum with ulcers in the surrounding mucosa.  Biopsies with acute ileitis.  Subsequent CT enterography with 15 cm segment of ileitis and 2 discrete strictures in the distal ileum.  While  this is certainly highly suspicious for Crohn's Disease (ileal, stricturing type), it is unclear if these are fixed strictures or if there is still an inflammatory component that would be amenable to medical therapy.  Superimposed on her clinical picture is her very low pancreatic elastase more suggestive of EPI.  EPI would be more consistent with her symptoms of chronic diarrhea, whereas the ileal strictures would be more in line with constipation or stuttering bowel habits.  Discussed the complex clinical situation with her at length today, with plan for the following:  - Check ESR and CRP today.  If normal, this would again be more suggestive of fixed, fibrotic ileal strictures - Otherwise normal/negative fecal calprotectin, lactoferrin so these would be less reliable marker moving forward to gauge response to therapy - Resume Entocort for now - Start Creon 2 capsules at meals and 1 capsule with snacks - We will discuss optimal timing for repeat colonoscopy at her follow-up appointment.  This would be to evaluate for improvement in ileal ulcers, reduction in stricture severity - Continue adequate hydration - Low  residue diet  3) CHF with reduced EF 4) CAD 5) OSA 6) History of LV thrombus - Endoscopic procedures need to be [facility] at [facility] due to elevated periprocedural risks from underlying significant comorbidities  RTC in 2 months or sooner prn  I spent 40 minutes of time, including in depth chart review, independent review of results as outlined above, communicating results with the patient directly, face-to-face time with the patient, coordinating care, ordering studies and medications as appropriate, and documentation.         Lavena Bullion ,DO, FACG 03/13/2022, 3:14 PM

## 2022-03-14 ENCOUNTER — Other Ambulatory Visit: Payer: Self-pay | Admitting: Internal Medicine

## 2022-03-14 ENCOUNTER — Other Ambulatory Visit (HOSPITAL_COMMUNITY): Payer: Self-pay

## 2022-03-14 DIAGNOSIS — I5020 Unspecified systolic (congestive) heart failure: Secondary | ICD-10-CM

## 2022-03-14 NOTE — Telephone Encounter (Signed)
Patient states that she does better when taking furosemide (LASIX) 40 MG tablet twice a day.

## 2022-03-15 ENCOUNTER — Encounter (HOSPITAL_COMMUNITY): Payer: Self-pay

## 2022-03-15 ENCOUNTER — Other Ambulatory Visit: Payer: Self-pay | Admitting: Internal Medicine

## 2022-03-15 ENCOUNTER — Other Ambulatory Visit (HOSPITAL_COMMUNITY): Payer: Self-pay

## 2022-03-15 DIAGNOSIS — I5020 Unspecified systolic (congestive) heart failure: Secondary | ICD-10-CM

## 2022-03-15 MED ORDER — IVABRADINE HCL 5 MG PO TABS
5.0000 mg | ORAL_TABLET | Freq: Two times a day (BID) | ORAL | 6 refills | Status: DC
  Filled 2022-03-15 (×2): qty 60, 30d supply, fill #0
  Filled 2022-04-16 – 2022-04-19 (×2): qty 60, 30d supply, fill #1
  Filled 2022-05-14: qty 60, 30d supply, fill #2
  Filled 2022-06-13: qty 60, 30d supply, fill #3
  Filled 2022-07-13: qty 60, 30d supply, fill #4
  Filled 2022-08-09: qty 60, 30d supply, fill #5
  Filled 2022-10-01: qty 60, 30d supply, fill #6

## 2022-03-15 MED FILL — Furosemide Tab 40 MG: ORAL | 30 days supply | Qty: 120 | Fill #0 | Status: AC

## 2022-03-16 ENCOUNTER — Encounter (HOSPITAL_BASED_OUTPATIENT_CLINIC_OR_DEPARTMENT_OTHER): Payer: Self-pay | Admitting: Emergency Medicine

## 2022-03-16 ENCOUNTER — Emergency Department (HOSPITAL_BASED_OUTPATIENT_CLINIC_OR_DEPARTMENT_OTHER)
Admission: EM | Admit: 2022-03-16 | Discharge: 2022-03-16 | Disposition: A | Discharge status: Home / Self Care | Payer: Medicaid Other | Attending: Emergency Medicine | Admitting: Emergency Medicine

## 2022-03-16 ENCOUNTER — Ambulatory Visit: Payer: Self-pay

## 2022-03-16 ENCOUNTER — Other Ambulatory Visit (HOSPITAL_COMMUNITY): Payer: Self-pay

## 2022-03-16 ENCOUNTER — Emergency Department (HOSPITAL_BASED_OUTPATIENT_CLINIC_OR_DEPARTMENT_OTHER): Payer: Medicaid Other

## 2022-03-16 ENCOUNTER — Other Ambulatory Visit: Payer: Self-pay

## 2022-03-16 DIAGNOSIS — D72829 Elevated white blood cell count, unspecified: Secondary | ICD-10-CM | POA: Insufficient documentation

## 2022-03-16 DIAGNOSIS — M79605 Pain in left leg: Secondary | ICD-10-CM | POA: Insufficient documentation

## 2022-03-16 DIAGNOSIS — R29818 Other symptoms and signs involving the nervous system: Secondary | ICD-10-CM | POA: Diagnosis not present

## 2022-03-16 DIAGNOSIS — I509 Heart failure, unspecified: Secondary | ICD-10-CM | POA: Diagnosis not present

## 2022-03-16 DIAGNOSIS — R29898 Other symptoms and signs involving the musculoskeletal system: Secondary | ICD-10-CM

## 2022-03-16 DIAGNOSIS — K5090 Crohn's disease, unspecified, without complications: Secondary | ICD-10-CM | POA: Insufficient documentation

## 2022-03-16 DIAGNOSIS — Z419 Encounter for procedure for purposes other than remedying health state, unspecified: Secondary | ICD-10-CM | POA: Diagnosis not present

## 2022-03-16 DIAGNOSIS — R531 Weakness: Secondary | ICD-10-CM | POA: Diagnosis not present

## 2022-03-16 DIAGNOSIS — Z79899 Other long term (current) drug therapy: Secondary | ICD-10-CM | POA: Diagnosis not present

## 2022-03-16 DIAGNOSIS — Z7901 Long term (current) use of anticoagulants: Secondary | ICD-10-CM | POA: Diagnosis not present

## 2022-03-16 DIAGNOSIS — G4733 Obstructive sleep apnea (adult) (pediatric): Secondary | ICD-10-CM | POA: Diagnosis not present

## 2022-03-16 DIAGNOSIS — I2510 Atherosclerotic heart disease of native coronary artery without angina pectoris: Secondary | ICD-10-CM | POA: Diagnosis not present

## 2022-03-16 LAB — CBC WITH DIFFERENTIAL/PLATELET
Abs Immature Granulocytes: 0.03 10*3/uL (ref 0.00–0.07)
Basophils Absolute: 0.1 10*3/uL (ref 0.0–0.1)
Basophils Relative: 1 %
Eosinophils Absolute: 0.1 10*3/uL (ref 0.0–0.5)
Eosinophils Relative: 1 %
HCT: 44.8 % (ref 36.0–46.0)
Hemoglobin: 15.2 g/dL — ABNORMAL HIGH (ref 12.0–15.0)
Immature Granulocytes: 0 %
Lymphocytes Relative: 13 %
Lymphs Abs: 1.8 10*3/uL (ref 0.7–4.0)
MCH: 29.2 pg (ref 26.0–34.0)
MCHC: 33.9 g/dL (ref 30.0–36.0)
MCV: 86.2 fL (ref 80.0–100.0)
Monocytes Absolute: 0.7 10*3/uL (ref 0.1–1.0)
Monocytes Relative: 5 %
Neutro Abs: 11.5 10*3/uL — ABNORMAL HIGH (ref 1.7–7.7)
Neutrophils Relative %: 80 %
Platelets: 411 10*3/uL — ABNORMAL HIGH (ref 150–400)
RBC: 5.2 MIL/uL — ABNORMAL HIGH (ref 3.87–5.11)
RDW: 13.9 % (ref 11.5–15.5)
WBC: 14.2 10*3/uL — ABNORMAL HIGH (ref 4.0–10.5)
nRBC: 0 % (ref 0.0–0.2)

## 2022-03-16 LAB — COMPREHENSIVE METABOLIC PANEL
ALT: 17 U/L (ref 0–44)
AST: 19 U/L (ref 15–41)
Albumin: 4.7 g/dL (ref 3.5–5.0)
Alkaline Phosphatase: 125 U/L (ref 38–126)
Anion gap: 11 (ref 5–15)
BUN: 17 mg/dL (ref 6–20)
CO2: 25 mmol/L (ref 22–32)
Calcium: 9.9 mg/dL (ref 8.9–10.3)
Chloride: 101 mmol/L (ref 98–111)
Creatinine, Ser: 0.81 mg/dL (ref 0.44–1.00)
GFR, Estimated: >60 mL/min (ref >60)
Glucose, Bld: 119 mg/dL — ABNORMAL HIGH (ref 70–99)
Potassium: 3.6 mmol/L (ref 3.5–5.1)
Sodium: 137 mmol/L (ref 135–145)
Total Bilirubin: 0.4 mg/dL (ref 0.3–1.2)
Total Protein: 7.8 g/dL (ref 6.5–8.1)

## 2022-03-16 NOTE — Discharge Instructions (Addendum)
Your work-up in the ER did not show signs of an acute stroke or blood clot in your leg.  Your potassium is normal.  I felt it was reasonable to discharge you home.  However we did talk about the small possibility of a mini stroke or TIA, which would not be seen on a CT scan.  The work-up for this would involve an MRI.  If your symptoms return, or you have new strokelike symptoms, including slurred speech, difficulty speaking, profound vertigo or balance difficulties, or worsening weakness of your arms or legs, please call 911 or return to the ER immediately.

## 2022-03-16 NOTE — ED Triage Notes (Signed)
Pt arrives to ED with c/o weakness. She notes that her left leg has felt weak since she woke this morning. LKN 1130pm last night. She notes left foot tingling.

## 2022-03-16 NOTE — Telephone Encounter (Signed)
  Chief Complaint: weakness Symptoms: LLE weakness, leg buckling  Frequency: today Pertinent Negatives: Patient denies leg pain or weakness any where else in body Disposition: [] ED /[] Urgent Care (no appt availability in office) / [x] Appointment(In office/virtual)/ []  [facility]/ [] Home Care/ [] Refused Recommended Disposition /[] [facility]/ []  Follow-up with PCP Additional Notes: pt unsure if related to hypokalemia or new medications for crohns but took an extra potassium today. States that leg keeping buckling after several steps of walking. Had ED visit on 03/02/22 and was kept for observation. Advised pt she would need FU appt and scheduled 03/20/22 at 1430. Advised pt if weakness gets worse or goes to another part of the body on the same side to go to ED. Pt verbalized understanding.   Reason for Disposition  [1] Weakness of the face, arm / hand, or leg / foot on one side of the body AND [2] gradual onset (e.g., days to weeks) AND [3] present now  Answer Assessment - Initial Assessment Questions 1. ONSET: "When did the pain start?"      Today  2. LOCATION: "Where is the pain located?"      RLE  3. PAIN: "How bad is the pain?"    (Scale 1-10; or mild, moderate, severe)   -  MILD (1-3): doesn't interfere with normal activities    -  MODERATE (4-7): interferes with normal activities (e.g., work or school) or awakens from sleep, limping    -  SEVERE (8-10): excruciating pain, unable to do any normal activities, unable to walk      6. OTHER SYMPTOMS: "Do you have any other symptoms?" (e.g., chest pain, back pain, breathing difficulty, swelling, rash, fever, numbness, weakness)     Weakness in LLE, feels like leg wants to buckle  Protocols used: Leg Pain-A-AH, Neurologic Deficit-A-AH

## 2022-03-16 NOTE — ED Provider Notes (Signed)
[facility] EMERGENCY DEPT Provider Note   CSN: [id number] Arrival date & time: 03/16/22  1407     History  Chief Complaint  Patient presents with   Weakness    Christine [name] is a 56 y.o. female w/ hx of crohn's disease, CHF (EF <20%), CAD s/p LAD occlusion, s/p ICD placement, OSA on CPAP, LV thrombus on eliquis, who presents to ED complaining of left leg heaviness.  Reports feeling left leg was "numb and tingling" this morning, and clumsy walking due to left leg weakness.  "Foot fell asleep" earlier as well.  Denies acute or new chest pain symptoms.  She was started on steroids this week for her GI issues, as well as creon 2 days ago.  Hx of hypoK as well, takes supplments at home.  HPI     Home Medications Prior to Admission medications   Medication Sig Start Date End Date Taking? Authorizing Provider  acetaminophen (TYLENOL) 500 MG tablet Take 1,000 mg by mouth 3 (three) times daily as needed for moderate pain or headache.    [provider]  ALPRAZolam Duanne Moron) 1 MG tablet Take 2 mg by mouth 3 (three) times daily.    [provider]  alum & mag hydroxide-simeth (MAALOX/MYLANTA) 200-200-20 MG/5ML suspension Take 30 mLs by mouth every 4 (four) hours as needed for indigestion or heartburn. 03/02/22   Atway, Rayann N, DO  apixaban (ELIQUIS) 5 MG TABS tablet Take 1 tablet (5 mg total) by mouth 2 (two) times daily. 11/10/21   Bensimhon, Shaune Pascal, MD  atorvastatin (LIPITOR) 80 MG tablet Take 1 tablet (80 mg total) by mouth daily. 01/19/22   Larey Dresser, MD  budesonide (ENTOCORT EC) 3 MG 24 hr capsule Take 3 capsules (9 mg total) by mouth daily. 03/02/22 04/27/22  Atway, Jeananne Rama, DO  cyclobenzaprine (FLEXERIL) 10 MG tablet Take 20 mg by mouth at bedtime.    [provider]  dapagliflozin propanediol (FARXIGA) 10 MG TABS tablet Take 1 tablet (10 mg total) by mouth daily. 10/20/21   Bensimhon, Shaune Pascal, MD  digoxin (LANOXIN) 0.125 MG tablet Take 1  tablet (0.125 mg total) by mouth daily. 02/12/22   Bensimhon, Shaune Pascal, MD  diphenhydrAMINE (BENADRYL) 25 MG tablet Take 50 mg by mouth as needed for allergies.    [provider]  diphenoxylate-atropine (LOMOTIL) 2.5-0.025 MG tablet Take 2 tablets by mouth 4 (four) times daily as needed for diarrhea or loose stools. 03/02/22   Atway, Rayann N, DO  feeding supplement (ENSURE ENLIVE / ENSURE PLUS) LIQD Take 237 mLs by mouth 2 (two) times daily between meals. 03/03/22   Atway, Rayann N, DO  furosemide (LASIX) 40 MG tablet Take 2 tablets (80 mg total) by mouth in the morning and every evening 03/15/22   Bensimhon, Shaune Pascal, MD  furosemide (LASIX) 80 MG tablet Take 1 tablet (80 mg total) by mouth daily as needed. As needed for weight gain (3 pounds in one day or 5 pounds in one week) or worsening edema. 03/02/22   Atway, Rayann N, DO  ivabradine (CORLANOR) 5 MG TABS tablet Take 1 tablet (5 mg total) by mouth 2 (two) times daily. 03/15/22   Ladell Pier, MD  lipase/protease/amylase (CREON) 36000 UNITS CPEP capsule Take 2 capsules ( 72000 units) with meals and 1 capsule ( 36000 units) with snack. 03/13/22   Cirigliano, Vito V, DO  metoprolol succinate (TOPROL-XL) 25 MG 24 hr tablet Take 1 tablet (25 mg total) by mouth daily. Take  with or immediately following a meal. 06/26/21   Bensimhon, Shaune Pascal, MD  metroNIDAZOLE (METROGEL) 1 % gel Apply topically as needed.    [provider]  Multiple Vitamins-Minerals (MULTIVITAMIN WITH MINERALS) tablet Take 1 tablet by mouth daily.    [provider]  oxymetazoline (AFRIN) 0.05 % nasal spray Place 1-2 sprays into both nostrils 2 (two) times daily as needed for congestion.    [provider]  Polyethyl Glycol-Propyl Glycol (SYSTANE OP) Place 2 drops into the right eye as needed (irritation). Patient not taking: Reported on 03/13/2022    [provider]  potassium chloride SA (KLOR-CON M) 20 MEQ tablet Take 2 tablets (40  mEq total) by mouth 2 (two) times daily. 02/27/22   Ladell Pier, MD  promethazine (PHENERGAN) 25 MG tablet Take 1 tablet (25 mg total) by mouth every 6 (six) to 8 (eight) hours for nausea or vomiting. Patient taking differently: Take 25 mg by mouth in the morning, at noon, and at bedtime. 02/27/22   Levin Erp, PA  spironolactone (ALDACTONE) 25 MG tablet Take 0.5 tablets (12.5 mg total) by mouth daily. 03/05/22   Bensimhon, Shaune Pascal, MD  zolpidem (AMBIEN) 10 MG tablet Take 5-10 mg by mouth at bedtime.    [provider]      Allergies    Sulfa antibiotics, Erythromycin, Tramadol, Ibuprofen, and Tape    Review of Systems   Review of Systems  Physical Exam Updated Vital Signs BP 110/67 (BP Location: Right Arm)   Pulse 80   Temp 98.5 F (36.9 C) (Oral)   Resp 16   SpO2 98%  Physical Exam Constitutional:      General: She is not in acute distress. HENT:     Head: Normocephalic and atraumatic.  Eyes:     Conjunctiva/sclera: Conjunctivae normal.     Pupils: Pupils are equal, round, and reactive to light.  Cardiovascular:     Rate and Rhythm: Normal rate and regular rhythm.     Pulses: Normal pulses.  Pulmonary:     Effort: Pulmonary effort is normal. No respiratory distress.  Abdominal:     General: There is no distension.     Tenderness: There is no abdominal tenderness.  Skin:    General: Skin is warm and dry.  Neurological:     General: No focal deficit present.     Mental Status: She is alert and oriented to person, place, and time. Mental status is at baseline.  Psychiatric:        Mood and Affect: Mood normal.        Behavior: Behavior normal.     ED Results / Procedures / Treatments   Labs (all labs ordered are listed, but only abnormal results are displayed) Labs Reviewed  COMPREHENSIVE METABOLIC PANEL - Abnormal; Notable for the following components:      Result Value   Glucose, Bld 119 (*)    All other components within normal  limits  CBC WITH DIFFERENTIAL/PLATELET - Abnormal; Notable for the following components:   WBC 14.2 (*)    RBC 5.20 (*)    Hemoglobin 15.2 (*)    Platelets 411 (*)    Neutro Abs 11.5 (*)    All other components within normal limits    EKG None  Radiology US Venous Img Lower Unilateral Left  Result Date: 03/16/2022 CLINICAL DATA:  Left leg weakness, foot numbness EXAM: LEFT LOWER EXTREMITY VENOUS DOPPLER ULTRASOUND TECHNIQUE: Gray-scale sonography with compression, as well as  color and duplex ultrasound, were performed to evaluate the deep venous system(s) from the level of the common femoral vein through the popliteal and proximal calf veins. COMPARISON:  None Available. FINDINGS: VENOUS Normal compressibility of the common femoral, superficial femoral, and popliteal veins, as well as the visualized calf veins. Visualized portions of profunda femoral vein and great saphenous vein unremarkable. No filling defects to suggest DVT on grayscale or color Doppler imaging. Doppler waveforms show normal direction of venous flow, normal respiratory plasticity and response to augmentation. Limited views of the contralateral common femoral vein are unremarkable. OTHER None. Limitations: none IMPRESSION: Negative. Electronically Signed   By: Jacqulynn Cadet M.D.   On: 03/16/2022 16:51   CT Head Wo Contrast  Result Date: 03/16/2022 CLINICAL DATA:  Neuro deficit, acute, stroke suspected left leg paresthesias EXAM: CT HEAD WITHOUT CONTRAST TECHNIQUE: Contiguous axial images were obtained from the base of the skull through the vertex without intravenous contrast. RADIATION DOSE REDUCTION: This exam was performed according to the departmental dose-optimization program which includes automated exposure control, adjustment of the mA and/or kV according to patient size and/or use of iterative reconstruction technique. COMPARISON:  None Available. FINDINGS: Brain: No evidence of acute infarction, hemorrhage,  hydrocephalus, extra-axial collection or mass lesion/mass effect. Vascular: No hyperdense vessel or unexpected calcification. Skull: Normal. Negative for fracture or focal lesion. Sinuses/Orbits: No acute finding. Other: None. IMPRESSION: No acute intracranial abnormality. Electronically Signed   By: Davina Poke D.O.   On: 03/16/2022 16:03    Procedures Procedures    Medications Ordered in ED Medications - No data to display  ED Course/ Medical Decision Making/ A&P Clinical Course as of 03/16/22 2302  [facility] Mar 16, 2022  1755 Patient is work-up is otherwise unremarkable.  No DVT or acute CVA noted.  I did discuss with both her sister, as well as the patient in the room, the small possibility of TIA, which cannot be excluded with a negative CT scan of the head.  At this point they prefer to go home which is reasonable.  If her symptoms were to return or there were new neurological symptoms, recommended returning to the hospital immediately.  Otherwise the patient has a follow-up appointment with her doctor's office early next week. [MT]    Clinical Course User Index [MT] Aboubacar Matsuo, Carola Rhine, MD                           Medical Decision Making Amount and/or Complexity of Data Reviewed Labs: ordered. Radiology: ordered.   This patient presents to the ED with concern for left leg weakness. This involves an extensive number of treatment options, and is a complaint that carries with it a high risk of complications and morbidity.  The differential diagnosis includes CVA versus peripheral neuropathy versus DVT versus other  Co-morbidities that complicate the patient evaluation: History of stroke risk factors  I ordered and personally interpreted labs.  The pertinent results include: No emergent findings.  Patient has a leukocytosis in setting of new steroid induction earlier this week.  There are no significant electrolyte abnormalities.  I ordered imaging studies including CT scan of the  brain, DVT ultrasound I independently visualized and interpreted imaging which showed no acute DVT, no emergent findings I agree with the radiologist interpretation  The patient was maintained on a cardiac monitor.  I personally viewed and interpreted the cardiac monitored which showed an underlying rhythm of: Sinus rhythm  After the  interventions noted above, I reevaluated the patient and found that they have: stayed the same  Dispostion:  After consideration of the diagnostic results and the patients response to treatment, I feel that the patent would benefit from close outpatient follow-up with PCP.  Patient was able to ambulate steadily here in the ED.  My suspicion overall for TIA or stroke is relatively low, and I did not feel she needed transfer for emergent MRI imaging at this time, but close return precautions were discussed with the patient.         Final Clinical Impression(s) / ED Diagnoses Final diagnoses:  Left leg weakness    Rx / DC Orders ED Discharge Orders     None         Wyvonnia Dusky, MD 03/16/22 2303

## 2022-03-16 NOTE — Telephone Encounter (Signed)
Inbound call from patient stating she has been placed on a few different medications and has started having complications with walking and also weakness on her left side. Please advise.  Thank you

## 2022-03-16 NOTE — Telephone Encounter (Signed)
Pt wanted to know if creon or budesonide could have caused the weakness in her left leg. Pt states that about every 3 or 4th step she takes she feels like she is going to fall. Let pt know that I didn't know of any side effects that would cause sudden leg weakness and advised pt go to the ER. Pt verbalized understanding.

## 2022-03-20 ENCOUNTER — Telehealth: Payer: Self-pay | Admitting: *Deleted

## 2022-03-20 ENCOUNTER — Ambulatory Visit: Payer: Medicaid Other | Attending: Internal Medicine | Admitting: Internal Medicine

## 2022-03-20 ENCOUNTER — Encounter: Payer: Self-pay | Admitting: Internal Medicine

## 2022-03-20 VITALS — BP 104/68 | HR 72 | Temp 98.5°F | Ht 70.0 in | Wt 185.0 lb

## 2022-03-20 DIAGNOSIS — R252 Cramp and spasm: Secondary | ICD-10-CM | POA: Diagnosis not present

## 2022-03-20 DIAGNOSIS — I2510 Atherosclerotic heart disease of native coronary artery without angina pectoris: Secondary | ICD-10-CM | POA: Diagnosis not present

## 2022-03-20 DIAGNOSIS — K8681 Exocrine pancreatic insufficiency: Secondary | ICD-10-CM

## 2022-03-20 DIAGNOSIS — R079 Chest pain, unspecified: Secondary | ICD-10-CM

## 2022-03-20 DIAGNOSIS — K50118 Crohn's disease of large intestine with other complication: Secondary | ICD-10-CM | POA: Diagnosis not present

## 2022-03-20 NOTE — Patient Outreach (Signed)
  Care Coordination [facility] Note Transition Care Management Follow-up Telephone Call Date of discharge and from where: 03/16/22 from [facility] How have you been since you were released from the hospital? Patient reports doing better, she is able to walk now. Any questions or concerns? No  Items Reviewed: Did the pt receive and understand the discharge instructions provided? Yes  Medications obtained and verified?  No new prescriptions Other? No  Any new allergies since your discharge? No  Dietary orders reviewed? Yes Do you have support at home? Yes   Home Care and Equipment/Supplies: Were home health services ordered? no If so, what is the name of the agency? N/A  Has the agency set up a time to come to the patient's home? not applicable Were any new equipment or medical supplies ordered?  No What is the name of the medical supply agency? N/A Were you able to get the supplies/equipment? not applicable Do you have any questions related to the use of the equipment or supplies? No  Functional Questionnaire: (I = Independent and D = Dependent) ADLs: I  Bathing/Dressing- I  Meal Prep- I  Eating- I  Maintaining continence- I  Transferring/Ambulation- I  Managing Meds- I  Follow up appointments reviewed:  PCP Hospital f/u appt confirmed? Yes  Scheduled to see Dr. Wynetta Emery on 03/20/22 @ 2:30pm. [facility] f/u appt confirmed?  N/A-ED visit   Are transportation arrangements needed? No  If their condition worsens, is the pt aware to call PCP or go to the Emergency Dept.? Yes Was the patient provided with contact information for the PCP's office or ED? No Was to pt encouraged to call back with questions or concerns? Yes  RNCM provided patient with information regarding CM services. Patient declines at this time. Patient understands that she can discuss with PCP and request a referral for CM at any time.  Lurena Joiner RN, BSN [place]  [employer]

## 2022-03-20 NOTE — Patient Instructions (Signed)
We will check your electrolytes again today. Let your cardiologist know that you are having the cramps at night which may be due to statin therapy and inquire whether the dose can be lowered or changed to Crestor instead. Please touch base with your cardiologist if chest pains continue.

## 2022-03-20 NOTE — Progress Notes (Unsigned)
Patient ID: Christine [name], female    DOB: [birth year]  MRN: [id number]  CC: Hospitalization Follow-up [facility] f/u. /Intermittent cramps in calves & feet, intermittent numbness. /Intermittent pain on L side of body & underneath L breast. Neoma Laming flu vax)   Subjective: Christine [name] is a 56 y.o. 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   Patient hospitalized 11/16-17/2023 with bilateral chest-epigastric pain and chronic diarrhea. She was seen by cardiology.  They did not feel that the chest pain/abdominal pain were cardiac in nature.  Workup for chronic diarrhea was already in process prior to patient's admission.  She had colonoscopy 11/13 showing terminal ileal stricture with ulceration.  Surgical pathology revealed acute ileitis that could be consistent with medication versus infection versus emerging IBD.  Patient had CT enterography during admission and this revealed changes suggesting Crohns Ds.  Started on budesonide. Patient has seen the gastroenterologist since hospital discharge.  Diagnosed with likely IBD and exocrine pancreatic insufficiency.  Started on Creon.  Since being on these medications, patient states that the diarrhea has decreased some.  Complains of intermittent cramps in the calf and feet that occurs mainly at rest and at nights.  No muscle pain or cramps with ambulation.  Occur several times a week.  Can last up to 15 minutes.  She is on Flexeril 10 mg 2 tablets at nights.  Seen in the ER recently for some discomfort in the right calf and left leg heaviness.  Doppler ultrasound negative for DVT.  Reports intermittent pain that lasts about 30 seconds on the left side of  the chest that starts lateral to the left breast and goes under the breasts.  Reports that she mentioned it to cardiologist Dr. Caryl Comes and he did not think it was cardiac in nature.  Recently turned in Zio devise which she wore because of palpitations that occur in mornings. 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[place]) 02/26/2022   Ulcer of ileum 02/26/2022   Diverticulosis of colon without hemorrhage 02/26/2022   Cardiomyopathy ([place]) ischemic and non-ischemic 01/16/2022   Abdominal aortic aneurysm (AAA) 3.0 cm to 5.0 cm in diameter in female [facility]) 01/09/2022   Polycythemia 01/09/2022   Chronic combined systolic and diastolic heart failure ([place]) 08/24/2021   CAD in native artery 08/24/2021   S/P ICD (internal cardiac defibrillator) procedure 08/10/2021   Former smoker 02/06/2021   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   Obesity (BMI 30.0-34.9) 02/06/2021   Anxiety 01/20/2021   Tobacco abuse 01/20/2021   Shortness of breath 01/20/2021   Chest tightness      Current Outpatient Medications on File Prior to Visit  Medication Sig Dispense Refill   acetaminophen (TYLENOL) 500 MG tablet Take 1,000 mg by mouth 3 (three) times daily as needed for moderate pain or headache.     ALPRAZolam (XANAX) 1 MG tablet Take 2 mg by mouth 3 (three) times daily.  alum & mag hydroxide-simeth (MAALOX/MYLANTA) 200-200-20 MG/5ML suspension Take 30 mLs by mouth every 4 (four) hours as needed for indigestion or heartburn. 355 mL 0   apixaban (ELIQUIS) 5 MG TABS tablet Take 1 tablet (5 mg total) by mouth 2 (two) times daily. 60 tablet 7   atorvastatin (LIPITOR) 80 MG tablet Take 1 tablet (80 mg total) by mouth daily. 90 tablet 3    budesonide (ENTOCORT EC) 3 MG 24 hr capsule Take 3 capsules (9 mg total) by mouth daily. 168 capsule 0   cyclobenzaprine (FLEXERIL) 10 MG tablet Take 20 mg by mouth at bedtime.     dapagliflozin propanediol (FARXIGA) 10 MG TABS tablet Take 1 tablet (10 mg total) by mouth daily. 30 tablet 11   digoxin (LANOXIN) 0.125 MG tablet Take 1 tablet (0.125 mg total) by mouth daily. 30 tablet 3   diphenhydrAMINE (BENADRYL) 25 MG tablet Take 50 mg by mouth as needed for allergies.     diphenoxylate-atropine (LOMOTIL) 2.5-0.025 MG tablet Take 2 tablets by mouth 4 (four) times daily as needed for diarrhea or loose stools. 56 tablet 0   feeding supplement (ENSURE ENLIVE / ENSURE PLUS) LIQD Take 237 mLs by mouth 2 (two) times daily between meals. 237 mL 12   furosemide (LASIX) 40 MG tablet Take 2 tablets (80 mg total) by mouth in the morning and every evening 120 tablet 3   ivabradine (CORLANOR) 5 MG TABS tablet Take 1 tablet (5 mg total) by mouth 2 (two) times daily. 60 tablet 6   lipase/protease/amylase (CREON) 36000 UNITS CPEP capsule Take 2 capsules ( 72000 units) with meals and 1 capsule ( 36000 units) with snack. 300 capsule 5   metoprolol succinate (TOPROL-XL) 25 MG 24 hr tablet Take 1 tablet (25 mg total) by mouth daily. Take with or immediately following a meal. 30 tablet 11   metroNIDAZOLE (METROGEL) 1 % gel Apply topically as needed.     Multiple Vitamins-Minerals (MULTIVITAMIN WITH MINERALS) tablet Take 1 tablet by mouth daily.     oxymetazoline (AFRIN) 0.05 % nasal spray Place 1-2 sprays into both nostrils 2 (two) times daily as needed for congestion.     Polyethyl Glycol-Propyl Glycol (SYSTANE OP) Place 2 drops into the right eye as needed (irritation).     potassium chloride SA (KLOR-CON M) 20 MEQ tablet Take 2 tablets (40 mEq total) by mouth 2 (two) times daily. 120 tablet 1   promethazine (PHENERGAN) 25 MG tablet Take 1 tablet (25 mg total) by mouth every 6 (six) to 8 (eight) hours for nausea or  vomiting. (Patient taking differently: Take 25 mg by mouth in the morning, at noon, and at bedtime.) 90 tablet 2   spironolactone (ALDACTONE) 25 MG tablet Take 0.5 tablets (12.5 mg total) by mouth daily. 45 tablet 3   zolpidem (AMBIEN) 10 MG tablet Take 5-10 mg by mouth at bedtime.     No current facility-administered medications on file prior to visit.    Allergies  Allergen Reactions   Sulfa Antibiotics Hives, Itching and Rash   Erythromycin Hives   Tramadol Rash and Other (See Comments)    Urinary retention     Ibuprofen Itching   Tape Rash    Social History   Socioeconomic History   Marital status: Single    Spouse name: Not on file   Number of children: 0   Years of education: 11   Highest education level: Not on file  Occupational History   Not on file  Tobacco Use   Smoking status: Former    Packs/day: 0.50    Types: Cigarettes    Quit date: 01/15/2021    Years since quitting: 1.1   Smokeless tobacco: Never  Vaping Use   Vaping Use: Never used  Substance and Sexual Activity   Alcohol use: Yes    Comment: rarely   Drug use: Never   Sexual activity: Not on file  Other Topics Concern   Not on file  Social History Narrative   Not on file   Social Determinants of Health   Financial Resource Strain: Medium Risk (01/31/2021)   Overall Financial Resource Strain (CARDIA)    Difficulty of Paying Living Expenses: Somewhat hard  Food Insecurity: No Food Insecurity (01/31/2021)   Hunger Vital Sign    Worried About Running Out of Food in the Last Year: Never true    Ran Out of Food in the Last Year: Never true  Transportation Needs: No Transportation Needs (01/31/2021)   PRAPARE - Hydrologist (Medical): No    Lack of Transportation (Non-Medical): No  Physical Activity: Not on file  Stress: Not on file  Social Connections: Not on file  Intimate Partner Violence: Not on file    Family History  Problem Relation Age of Onset    Stroke Mother    Atrial fibrillation Mother    Heart failure Father    Stomach cancer Maternal Grandfather    Liver cancer Neg Hx    Esophageal cancer Neg Hx    Colon polyps Neg Hx    Colon cancer Neg Hx     Past Surgical History:  Procedure Laterality Date   ABDOMINAL HYSTERECTOMY     BIOPSY  02/26/2022   Procedure: BIOPSY;  Surgeon: Lavena Bullion, DO;  Location: [facility];  Service: Gastroenterology;;   CARDIAC CATHETERIZATION     CHOLECYSTECTOMY     COLONOSCOPY WITH PROPOFOL N/A 02/26/2022   Procedure: COLONOSCOPY WITH PROPOFOL;  Surgeon: Lavena Bullion, DO;  Location: [facility];  Service: Gastroenterology;  Laterality: N/A;   ESOPHAGOGASTRODUODENOSCOPY (EGD) WITH PROPOFOL N/A 02/26/2022   Procedure: ESOPHAGOGASTRODUODENOSCOPY (EGD) WITH PROPOFOL;  Surgeon: Lavena Bullion, DO;  Location: [facility];  Service: Gastroenterology;  Laterality: N/A;   ICD IMPLANT N/A 07/21/2021   Procedure: ICD IMPLANT;  Surgeon: Deboraha Sprang, MD;  Location: [place] [facility];  Service: Cardiovascular;  Laterality: N/A;   NECK SURGERY     OVARIAN CYST SURGERY     POLYPECTOMY  02/26/2022   Procedure: POLYPECTOMY;  Surgeon: Lavena Bullion, DO;  Location: [facility];  Service: Gastroenterology;;   RIGHT/LEFT HEART CATH AND CORONARY ANGIOGRAPHY N/A 01/23/2021   Procedure: RIGHT/LEFT HEART CATH AND CORONARY ANGIOGRAPHY;  Surgeon: Jolaine Artist, MD;  Location: [place] [facility];  Service: Cardiovascular;  Laterality: N/A;    ROS: Review of Systems Negative except as stated above  PHYSICAL EXAM: BP 104/68 (BP Location: Left Arm, Patient Position: Sitting, Cuff Size: Normal)   Pulse 72   Temp 98.5 F (36.9 C) (Oral)   Ht 5' 10"  (1.778 m)   Wt 185 lb (83.9 kg)   SpO2 96%   BMI 26.54 kg/m   Physical Exam  General appearance - alert, well appearing, and in no distress Mental status - normal mood, behavior, speech, dress, motor activity, and thought  processes Chest - clear to auscultation, no wheezes, rales or rhonchi, symmetric air entry Heart - normal rate, regular rhythm, normal S1, S2, no  murmurs, rubs, clicks or gallops Extremities -legs are warm.  Dorsalis pedis, posterior tibialis, popliteal pulses are 3+ bilaterally      Latest Ref Rng & Units 03/16/2022    2:33 PM 03/02/2022    3:39 AM 03/01/2022    1:37 AM  CMP  Glucose 70 - 99 mg/dL 119  137  137   BUN 6 - 20 mg/dL 17  7  7    Creatinine 0.44 - 1.00 mg/dL 0.81  0.84  0.80   Sodium 135 - 145 mmol/L 137  138  133   Potassium 3.5 - 5.1 mmol/L 3.6  3.4  2.8   Chloride 98 - 111 mmol/L 101  104  95   CO2 22 - 32 mmol/L 25  26  27    Calcium 8.9 - 10.3 mg/dL 9.9  8.6  8.9   Total Protein 6.5 - 8.1 g/dL 7.8  6.0  7.2   Total Bilirubin 0.3 - 1.2 mg/dL 0.4  0.5  0.5   Alkaline Phos 38 - 126 U/L 125  94  115   AST 15 - 41 U/L 19  21  25    ALT 0 - 44 U/L 17  33  42    Lipid Panel     Component Value Date/Time   CHOL 185 03/01/2022 0339   TRIG 221 (H) 03/01/2022 0339   HDL 36 (L) 03/01/2022 0339   CHOLHDL 5.1 03/01/2022 0339   VLDL 44 (H) 03/01/2022 0339   LDLCALC 105 (H) 03/01/2022 0339    CBC    Component Value Date/Time   WBC 14.2 (H) 03/16/2022 1433   RBC 5.20 (H) 03/16/2022 1433   HGB 15.2 (H) 03/16/2022 1433   HGB 14.9 12/25/2021 1119   HGB 17.5 (H) 08/10/2021 1023   HCT 44.8 03/16/2022 1433   HCT 50.7 (H) 08/10/2021 1023   PLT 411 (H) 03/16/2022 1433   PLT 326 12/25/2021 1119   PLT 374 08/10/2021 1023   MCV 86.2 03/16/2022 1433   MCV 86 08/10/2021 1023   MCH 29.2 03/16/2022 1433   MCHC 33.9 03/16/2022 1433   RDW 13.9 03/16/2022 1433   RDW 13.1 08/10/2021 1023   LYMPHSABS 1.8 03/16/2022 1433   LYMPHSABS 1.8 08/10/2021 1023   MONOABS 0.7 03/16/2022 1433   EOSABS 0.1 03/16/2022 1433   EOSABS 0.3 08/10/2021 1023   BASOSABS 0.1 03/16/2022 1433   BASOSABS 0.1 08/10/2021 1023    ASSESSMENT AND PLAN:  1. Leg cramps History and exam does not suggest  PAD. Cramps could be due to statin therapy or intermittent low K+ level.  She is on atorvastatin 80 mg daily.  Last lipid profile done 2 weeks ago showed LDL of 105 which is not at goal. I suggest she speaks with her cardiologist on next visit about whether statin dose can be decreased or change to Crestor.  Has appt with cardiology later this mth - Basic Metabolic Panel - Magnesium  2. CAD in native artery 3. Chest pain in adult -Patient evaluated by cardiology in hospital and chest pain thought to be noncardiac.  She has upcoming appointment with cardiology later this month.  Continue metoprolol, Eliquis and statin  4. Crohn's disease of large intestine with other complication ([place]) Simply started on budesonide  5. Exocrine pancreatic insufficiency Started on Creon by gastroenterology.  She reports decreased diarrhea episodes being on this medication and budesonide.    Patient was given the opportunity to ask questions.  Patient verbalized understanding of the plan and was  able to repeat key elements of the plan.   This documentation was completed using Radio producer.  Any transcriptional errors are unintentional.  No orders of the defined types were placed in this encounter.    Requested Prescriptions    No prescriptions requested or ordered in this encounter    No follow-ups on file.  Karle Plumber, MD, FACP

## 2022-03-21 LAB — BASIC METABOLIC PANEL
BUN/Creatinine Ratio: 19 (ref 9–23)
BUN: 17 mg/dL (ref 6–24)
CO2: 29 mmol/L (ref 20–29)
Calcium: 9.7 mg/dL (ref 8.7–10.2)
Chloride: 97 mmol/L (ref 96–106)
Creatinine, Ser: 0.88 mg/dL (ref 0.57–1.00)
Glucose: 87 mg/dL (ref 70–99)
Potassium: 3.9 mmol/L (ref 3.5–5.2)
Sodium: 142 mmol/L (ref 134–144)
eGFR: 78 mL/min/{1.73_m2} (ref >59)

## 2022-03-21 LAB — MAGNESIUM: Magnesium: 2.1 mg/dL (ref 1.6–2.3)

## 2022-03-27 ENCOUNTER — Other Ambulatory Visit (HOSPITAL_COMMUNITY): Payer: Self-pay

## 2022-03-27 ENCOUNTER — Other Ambulatory Visit: Payer: Self-pay

## 2022-03-29 ENCOUNTER — Encounter (HOSPITAL_COMMUNITY): Payer: Self-pay

## 2022-03-30 ENCOUNTER — Ambulatory Visit: Payer: Medicaid Other | Admitting: Physician Assistant

## 2022-04-02 NOTE — Progress Notes (Signed)
Advanced Heart Failure Clinic Note   PCP: [facility]: Dr Oval Linsey HF Cardiologist: Dr Haroldine Laws  HPI: Christine [name] is a 56 y.o.female with history of chronic tobacco use, ADHD, anxiety, depression, CAD,  systolic HF   Presented to ED 01/20/21 with increased shortness of breath/tachycardia. Adderrall stopped. Echo with EF 20-25%,  LHC/RHC with single vessel CAD (occluded mLAD) elevated filling pressures and  moderately reduced CO. Digoxin added. Beta blocker stopped.  Discharged to home 01/25/21. Discharge weight 213 pounds.   Echo 05/11/21 EF < 20% LV severely dilated RV ok Mild MR. Referred for ICD.  CPX with very mild HF limitation with elevated Ve/VCO2 slope  S/p [employer] ICD 4/23.  Saw PCP 08/10/21 for dizziness and anxiety. Felt insomnia related, labs unremarkable.  Seen in ED 08/18/21 with sudden onset SOB. CXR and EKG re-assuring, Trop x 2 normal. BNP mildly up, advised admission to further work up. She declined, as symptoms completely resolved, and was elected to discharge home.  Seen in ED 10/06/21 for urinary retention, required foley. Felt to be 2/2 to prazosin and this was stopped.  Admitted (03/01/22) with CP and chronic diarrhea. Hstrop negative. Felt to have Crohn's disease. Losartan and spiro stopped due to low BP.   Follow up 11/23, having palps and wearing Zio, Arlyce Harman restarted, and started on wellbutrin.  Today she returns for HF follow up. Overall feeling fine. Main complaint is left leg weakness that started a few weeks ago, leg started to buckle while she walked. She attributes this to low potassium or possibly due to statin. Has occasional chest pain under left breast, short bursts that resolve spontaneously. Continues to feel palpitations, describes as "fight or flight feeling."No SOB walking on flat ground. Dizzy when straining while having BM. Denies abnormal bleeding, edema, or PND/Orthopnea. Appetite poor. No fever or  chills. Weight at home 184 pounds. Taking all medications. Diarrhea has resolved. Takes extra Lasix 40 about 3x/week.   Cardiac Testing  - Echo 05/11/21 EF < 20% LV severely dilated RV ok Mild MR   - Echo (10/22): EF 20-25% LV severely dilated, LV apical mural thrombus, RV okay, moderate MR.  - R/LHC (10/22): w/ severe 1v CAD with occlusion of mid LAD., PCW 27, CO 4.7, CI 2.2    - cMRI (10/22): demonstrated subendocardial LGE consistent with prior infarcts in LV basal inferolateral wall, apical anterior/septal/inferior walls and apex. LVEF 22%.  - CPX 3/23: FVC 3.60 (85%)      FEV1 2.89 (87%)        FEV1/FVC 80 (100%)        MVV 171 (157%)   Resting HR: 113 Standing HR: 113 Peak HR: 149   (89 % age predicted max HR)  BP rest: 98/60 Standing BP: 90/62 BP peak: 134/58  Peak VO2: 18.8 (90% predicted peak VO2)  VE/VCO2 slope:  33 Peak RER: 1.14  VE/MVV:  38% O2pulse:  11   (100 % predicted O2pulse)   ROS: All systems negative except as listed in HPI, PMH and Problem List.  SH:  Social History   Socioeconomic History   Marital status: Single    Spouse name: Not on file   Number of children: 0   Years of education: 16   Highest education level: Not on file  Occupational History   Not on file  Tobacco Use   Smoking status: Former    Packs/day: 0.50    Types: Cigarettes    Quit date: 01/15/2021  Years since quitting: 1.2   Smokeless tobacco: Never  Vaping Use   Vaping Use: Never used  Substance and Sexual Activity   Alcohol use: Yes    Comment: rarely   Drug use: Never   Sexual activity: Not on file  Other Topics Concern   Not on file  Social History Narrative   Not on file   Social Determinants of Health   Financial Resource Strain: Medium Risk (01/31/2021)   Overall Financial Resource Strain (CARDIA)    Difficulty of Paying Living Expenses: Somewhat hard  Food Insecurity: No Food Insecurity (01/31/2021)   Hunger Vital Sign    Worried About Running Out of Food  in the Last Year: Never true    Ran Out of Food in the Last Year: Never true  Transportation Needs: No Transportation Needs (01/31/2021)   PRAPARE - Hydrologist (Medical): No    Lack of Transportation (Non-Medical): No  Physical Activity: Not on file  Stress: Not on file  Social Connections: Not on file  Intimate Partner Violence: Not on file   FH:  Family History  Problem Relation Age of Onset   Stroke Mother    Atrial fibrillation Mother    Heart failure Father    Stomach cancer Maternal Grandfather    Liver cancer Neg Hx    Esophageal cancer Neg Hx    Colon polyps Neg Hx    Colon cancer Neg Hx    Past Medical History:  Diagnosis Date   Allergy    Anxiety 01/20/2021   CAD in native artery 08/24/2021   CHF (congestive heart failure) (HCC)    Chronic combined systolic and diastolic heart failure ([place]) 32/95/1884   Complication of anesthesia    Depression    Hyperlipidemia    Myocardial infarction ([place])    Presence of cardiac defibrillator 07/2021   Shortness of breath 01/20/2021   Sleep apnea 07/2021   Tobacco abuse 01/20/2021   Current Outpatient Medications  Medication Sig Dispense Refill   acetaminophen (TYLENOL) 500 MG tablet Take 1,000 mg by mouth 3 (three) times daily as needed for moderate pain or headache.     ALPRAZolam (XANAX) 1 MG tablet Take 2 mg by mouth 3 (three) times daily.     alum & mag hydroxide-simeth (MAALOX/MYLANTA) 200-200-20 MG/5ML suspension Take 30 mLs by mouth every 4 (four) hours as needed for indigestion or heartburn. 355 mL 0   apixaban (ELIQUIS) 5 MG TABS tablet Take 1 tablet (5 mg total) by mouth 2 (two) times daily. 60 tablet 7   atorvastatin (LIPITOR) 80 MG tablet Take 1 tablet (80 mg total) by mouth daily. 90 tablet 3   budesonide (ENTOCORT EC) 3 MG 24 hr capsule Take 3 capsules (9 mg total) by mouth daily. 168 capsule 0   buPROPion (WELLBUTRIN XL) 150 MG 24 hr tablet Take 150 mg by mouth daily.      cyclobenzaprine (FLEXERIL) 10 MG tablet Take 20 mg by mouth at bedtime.     dapagliflozin propanediol (FARXIGA) 10 MG TABS tablet Take 1 tablet (10 mg total) by mouth daily. 30 tablet 11   digoxin (LANOXIN) 0.125 MG tablet Take 1 tablet (0.125 mg total) by mouth daily. 30 tablet 3   diphenhydrAMINE (BENADRYL) 25 MG tablet Take 50 mg by mouth as needed for allergies.     diphenoxylate-atropine (LOMOTIL) 2.5-0.025 MG tablet Take 2 tablets by mouth 4 (four) times daily as needed for diarrhea or loose stools. 56 tablet 0  feeding supplement (ENSURE ENLIVE / ENSURE PLUS) LIQD Take 237 mLs by mouth 2 (two) times daily between meals. 237 mL 12   furosemide (LASIX) 40 MG tablet Take 2 tablets (80 mg total) by mouth in the morning and every evening 120 tablet 3   ivabradine (CORLANOR) 5 MG TABS tablet Take 1 tablet (5 mg total) by mouth 2 (two) times daily. 60 tablet 6   lipase/protease/amylase (CREON) 36000 UNITS CPEP capsule Take 2 capsules ( 72000 units) with meals and 1 capsule ( 36000 units) with snack. 300 capsule 5   metoprolol succinate (TOPROL-XL) 25 MG 24 hr tablet Take 1 tablet (25 mg total) by mouth daily. Take with or immediately following a meal. 30 tablet 11   metroNIDAZOLE (METROGEL) 1 % gel Apply topically as needed.     Multiple Vitamins-Minerals (MULTIVITAMIN WITH MINERALS) tablet Take 1 tablet by mouth daily.     oxymetazoline (AFRIN) 0.05 % nasal spray Place 1-2 sprays into both nostrils 2 (two) times daily as needed for congestion.     Polyethyl Glycol-Propyl Glycol (SYSTANE OP) Place 2 drops into the right eye as needed (irritation).     potassium chloride SA (KLOR-CON M) 20 MEQ tablet Take 2 tablets (40 mEq total) by mouth 2 (two) times daily. 120 tablet 1   promethazine (PHENERGAN) 25 MG tablet Take 1 tablet (25 mg total) by mouth every 6 (six) to 8 (eight) hours for nausea or vomiting. (Patient taking differently: Take 25 mg by mouth in the morning, at noon, and at bedtime.) 90  tablet 2   spironolactone (ALDACTONE) 25 MG tablet Take 0.5 tablets (12.5 mg total) by mouth daily. 45 tablet 3   zolpidem (AMBIEN) 10 MG tablet Take 5-10 mg by mouth at bedtime.     No current facility-administered medications for this encounter.   BP 102/66   Pulse 64   Ht 5' 10"  (1.778 m)   Wt 83.8 kg (184 lb 12.8 oz)   SpO2 98%   BMI 26.52 kg/m   Wt Readings from Last 3 Encounters:  04/04/22 83.8 kg (184 lb 12.8 oz)  03/20/22 83.9 kg (185 lb)  03/13/22 83.7 kg (184 lb 9.6 oz)   PHYSICAL EXAM: General:  NAD. No resp difficulty HEENT: Normal Neck: Supple. No JVD. Carotids 2+ bilat; no bruits. No lymphadenopathy or thryomegaly appreciated. Cor: PMI nondisplaced. Regular rate & rhythm. No rubs, gallops or murmurs. Lungs: Clear Abdomen: Soft, nontender, nondistended. No hepatosplenomegaly. No bruits or masses. Good bowel sounds. Extremities: No cyanosis, clubbing, rash, edema Neuro: Alert & oriented x 3, cranial nerves grossly intact. Moves all 4 extremities w/o difficulty. Affect pleasant.  ICD interrogation (personally reviewed): HL Score 7, fluid OK. No VT. Activity level 1 hr, average HR 63 bpm  ASSESSMENT & PLAN: 1. 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 Echo EF 20-25%, severely dilated LV, RV okay, moderate MR - R/LHC (10/22): 1v CAD occluded mid LAD PCWP 27,  Fick 4.7/2.  - cMRI EF 22% subendocardial LGE consistent with prior infarcts in LV basal inferolateral wall, apical anterior/septal/inferior walls and apex. No viability. RV okay. Not sure how to explain inferior defects on cMRI  - Echo (05/11/21) EF < 20% RV ok - CPX 1/23 with very mild HF limitation:  Peak VO2: 18.8 (90% predicted peak VO2) VE/VCO2 slope: 33 Peak RER: 1.14  - s/p [employer] ICD 4/23. - Stable NYHA III. Volume creeping up on HL.  - Continue Lasix 80 bid. Can take  extra as needed - Decrease spiro back to 12.5 mg daily. - Continue Ivabradine 5 mg bid.  - Continue Toprol XL 25 mg  daily. - Continue digoxin 0.125 mg daily. Last dig level 0.7 (01/17/22) - Continue Farxiga 10 mg daily.  - Hold off on adding losartan back with dizziness. - Needs to remain off adderall with reduced EF. - Recent labs OK   2. Palpitations - No longer on Adderall. Drinks 1 Pepsi a day  - Zio 14-day (11/22) showed mostly SR, no high-grade arrhythmias. - Zio 14 day (12/23) showed mostly SR, 2 short SVT runs, <1% VE's  3. CAD - Single vessel LAD occlusion on LHC. - No s/s ischemia - Continue statin.  - No ASA with Eliquis.  4. HLD - Last LDL 105, goal < 55. - Continue atorva 80. Can try alternating with 80 daily with 40 every other day to see if leg pain improves. - Refer to Lipid Clinic for possible PCSK9i.   5. Tobacco use - Remains quit from cigarettes.  6. LV apical thrombus - Noted on echo/cMRI.  - On Eliquis. No bleeding issues.   7. Diarrhea  - Following with GI. Felt to have Crohn's disease. - Improving.  8. OSA - Mild on sleep study, AHI 11.8 - Wearing CPAP  9. Polycythemia - Follows with Heme/Onc. - JAK negative.  10. AAA - CT scan 11/23 3.1 cm. - f/u u/s in 3 years recommended  Follow up 4-6 weeks with PharmD (add back low dose losartan at night if BP allows) and 3-4 months with Dr. Melynda Keller FNP-BC 12:06 PM

## 2022-04-04 ENCOUNTER — Ambulatory Visit (HOSPITAL_COMMUNITY)
Admission: RE | Admit: 2022-04-04 | Discharge: 2022-04-04 | Disposition: A | Discharge status: Home / Self Care | Payer: Medicaid Other | Source: Ambulatory Visit | Attending: Family Medicine | Admitting: Family Medicine

## 2022-04-04 ENCOUNTER — Encounter (HOSPITAL_COMMUNITY): Payer: Self-pay

## 2022-04-04 ENCOUNTER — Other Ambulatory Visit (HOSPITAL_COMMUNITY): Payer: Self-pay

## 2022-04-04 VITALS — BP 102/66 | HR 64 | Ht 70.0 in | Wt 184.8 lb

## 2022-04-04 DIAGNOSIS — I513 Intracardiac thrombosis, not elsewhere classified: Secondary | ICD-10-CM | POA: Insufficient documentation

## 2022-04-04 DIAGNOSIS — F419 Anxiety disorder, unspecified: Secondary | ICD-10-CM | POA: Diagnosis not present

## 2022-04-04 DIAGNOSIS — I2510 Atherosclerotic heart disease of native coronary artery without angina pectoris: Secondary | ICD-10-CM | POA: Diagnosis not present

## 2022-04-04 DIAGNOSIS — Z720 Tobacco use: Secondary | ICD-10-CM

## 2022-04-04 DIAGNOSIS — I714 Abdominal aortic aneurysm, without rupture, unspecified: Secondary | ICD-10-CM | POA: Insufficient documentation

## 2022-04-04 DIAGNOSIS — R197 Diarrhea, unspecified: Secondary | ICD-10-CM | POA: Diagnosis not present

## 2022-04-04 DIAGNOSIS — R002 Palpitations: Secondary | ICD-10-CM | POA: Diagnosis not present

## 2022-04-04 DIAGNOSIS — G4733 Obstructive sleep apnea (adult) (pediatric): Secondary | ICD-10-CM | POA: Diagnosis not present

## 2022-04-04 DIAGNOSIS — D751 Secondary polycythemia: Secondary | ICD-10-CM | POA: Diagnosis not present

## 2022-04-04 DIAGNOSIS — F32A Depression, unspecified: Secondary | ICD-10-CM | POA: Diagnosis not present

## 2022-04-04 DIAGNOSIS — Z7901 Long term (current) use of anticoagulants: Secondary | ICD-10-CM | POA: Insufficient documentation

## 2022-04-04 DIAGNOSIS — I110 Hypertensive heart disease with heart failure: Secondary | ICD-10-CM | POA: Diagnosis not present

## 2022-04-04 DIAGNOSIS — E785 Hyperlipidemia, unspecified: Secondary | ICD-10-CM | POA: Insufficient documentation

## 2022-04-04 DIAGNOSIS — Z79899 Other long term (current) drug therapy: Secondary | ICD-10-CM | POA: Insufficient documentation

## 2022-04-04 DIAGNOSIS — I5022 Chronic systolic (congestive) heart failure: Secondary | ICD-10-CM | POA: Insufficient documentation

## 2022-04-04 MED ORDER — SPIRONOLACTONE 25 MG PO TABS
12.5000 mg | ORAL_TABLET | Freq: Every day | ORAL | 3 refills | Status: DC
  Filled 2022-04-04: qty 45, 90d supply, fill #0

## 2022-04-04 NOTE — Patient Instructions (Signed)
Thank you for coming in today  You have referred to the lipid clinic and their office will call and contact you with appt details.  DECREASE Spironolactone to 12.5 mg 1/2 tablet daily   Your physician recommends that you schedule a follow-up appointment in: 4-6 weeks with pharmacy 3 months with Dr. Haroldine Laws    Do the following things EVERYDAY: Weigh yourself in the morning before breakfast. Write it down and keep it in a log. Take your medicines as prescribed Eat low salt foods--Limit salt (sodium) to 2000 mg per day.  Stay as active as you can everyday Limit all fluids for the day to less than 2 liters  At the [facility], you and your health needs are our priority. As part of our continuing mission to provide you with exceptional heart care, we have created designated Provider Care Teams. These Care Teams include your primary Cardiologist (physician) and Advanced Practice Providers (APPs- Physician Assistants and Nurse Practitioners) who all work together to provide you with the care you need, when you need it.   You may see any of the following providers on your designated Care Team at your next follow up: Dr Glori Bickers Dr Loralie Champagne Dr. Roxana Hires, NP Lyda Jester, [place] [facility] [place], [place] Forestine Na, NP Audry Riles, PharmD   Please be sure to bring in all your medications bottles to every appointment.   If you have any questions or concerns before your next appointment please send [place] a message through [place] or call our office at [phone].    TO LEAVE A MESSAGE FOR THE NURSE SELECT OPTION 2, PLEASE LEAVE A MESSAGE INCLUDING: YOUR NAME DATE OF BIRTH CALL BACK NUMBER REASON FOR CALL**this is important as we prioritize the call backs  YOU WILL RECEIVE A CALL BACK THE SAME DAY AS LONG AS YOU CALL BEFORE 4:00 PM

## 2022-04-05 ENCOUNTER — Other Ambulatory Visit: Payer: Self-pay

## 2022-04-05 ENCOUNTER — Other Ambulatory Visit (HOSPITAL_COMMUNITY): Payer: Self-pay

## 2022-04-05 MED FILL — Furosemide Tab 40 MG: ORAL | 30 days supply | Qty: 120 | Fill #1 | Status: AC

## 2022-04-05 MED FILL — Furosemide Tab 40 MG: ORAL | 30 days supply | Qty: 120 | Fill #1 | Status: CN

## 2022-04-12 ENCOUNTER — Encounter: Payer: Self-pay | Admitting: Internal Medicine

## 2022-04-13 ENCOUNTER — Ambulatory Visit: Payer: Self-pay | Admitting: *Deleted

## 2022-04-13 NOTE — Telephone Encounter (Signed)
  Chief Complaint: Dry cough at night that is worse than during the daytime.   Symptoms: Sweats, chills, decreased appetite and a productive cough in the mornings only Frequency: About a week now Pertinent Negatives: Patient denies Fever or shortness of breath or chest tightness Disposition: [] ED /[x] Urgent Care (no appt availability in office) / [] Appointment(In office/virtual)/ []  [place] Virtual [facility]/ [] Home Care/ [] Refused Recommended Disposition /[]  Mobile Bus/ []  Follow-up with PCP Additional Notes: No appts. Available at [place].   I offered her a MyChart virtual visit or urgent care.    She has decided to wait and see how she does.  She thanked me for telling her about the virtual care option and will use that if she needs to over the New Year's holiday if she spikes a fever or develops shortness of breath or chest tightness due to the prolonged wait times in the EDs and urgent cares.

## 2022-04-13 NOTE — Telephone Encounter (Signed)
Message from Shan Levans sent at 04/13/2022 10:06 AM EST  Summary: Cough   Patient states that she has had the chills, sweats, decreased appetite and a productive cough for a week. Patient was instructed by pcp to take home covid test, which was negative. Patient is requesting an rs for symptoms. No appointments available.          Call History   Type Contact Phone/Fax User  04/13/2022 10:02 AM EST Phone (Incoming) Demaris, [place] (Self) [phone] (M) [place], [place]   Reason for Disposition  [1] Continuous (nonstop) coughing interferes with work or school AND [2] no improvement using cough treatment per Care Advice  Answer Assessment - Initial Assessment Questions 1. ONSET: "When did the cough begin?"      It's a dry cough at night that doesn't stop.   In the mornings I can cough up mucus that is green for an hour then it stops.    I don't feel congested in my chest.    This cough is terrible A week  this has been going on.    Started last Friday.    Covid is negative Congested in my  head, headache, chills and sweats, decreased appetite.   2. SEVERITY: "How bad is the cough today?"       it is bad at night. 3. SPUTUM: "Describe the color of your sputum" (none, dry cough; clear, white, yellow, green)     Green mucus in the mornings only 4. HEMOPTYSIS: "Are you coughing up any blood?" If so ask: "How much?" (flecks, streaks, tablespoons, etc.)     Not asked 5. DIFFICULTY BREATHING: "Are you having difficulty breathing?" If Yes, ask: "How bad is it?" (e.g., mild, moderate, severe)    - MILD: No SOB at rest, mild SOB with walking, speaks normally in sentences, can lie down, no retractions, pulse < 100.    - MODERATE: SOB at rest, SOB with minimal exertion and prefers to sit, cannot lie down flat, speaks in phrases, mild retractions, audible wheezing, pulse 100-120.    - SEVERE: Very SOB at rest, speaks in single words, struggling to breathe, sitting hunched forward, retractions,  pulse > 120      No shortness of breath  6. FEVER: "Do you have a fever?" If Yes, ask: "What is your temperature, how was it measured, and when did it start?"     No 7. CARDIAC HISTORY: "Do you have any history of heart disease?" (e.g., heart attack, congestive heart failure)      Not asked 8. LUNG HISTORY: "Do you have any history of lung disease?"  (e.g., pulmonary embolus, asthma, emphysema)     Not asked 9. PE RISK FACTORS: "Do you have a history of blood clots?" (or: recent major surgery, recent prolonged travel, bedridden)     Not asked 10. OTHER SYMPTOMS: "Do you have any other symptoms?" (e.g., runny nose, wheezing, chest pain)       See above 11. PREGNANCY: "Is there any chance you are pregnant?" "When was your last menstrual period?"       N/A due to age 16. TRAVEL: "Have you traveled out of the country in the last month?" (e.g., travel history, exposures)       Not asked  Protocols used: Cough - Acute Productive-A-AH

## 2022-04-16 ENCOUNTER — Other Ambulatory Visit: Payer: Self-pay | Admitting: Internal Medicine

## 2022-04-16 DIAGNOSIS — Z419 Encounter for procedure for purposes other than remedying health state, unspecified: Secondary | ICD-10-CM | POA: Diagnosis not present

## 2022-04-16 DIAGNOSIS — G4733 Obstructive sleep apnea (adult) (pediatric): Secondary | ICD-10-CM | POA: Diagnosis not present

## 2022-04-18 ENCOUNTER — Ambulatory Visit (INDEPENDENT_AMBULATORY_CARE_PROVIDER_SITE_OTHER): Payer: 59

## 2022-04-18 ENCOUNTER — Other Ambulatory Visit (HOSPITAL_COMMUNITY): Payer: Self-pay

## 2022-04-18 DIAGNOSIS — I425 Other restrictive cardiomyopathy: Secondary | ICD-10-CM

## 2022-04-19 ENCOUNTER — Other Ambulatory Visit (HOSPITAL_COMMUNITY): Payer: Self-pay

## 2022-04-19 LAB — CUP PACEART REMOTE DEVICE CHECK
Battery Remaining Longevity: 174 mo
Battery Remaining Percentage: 100 %
Brady Statistic RV Percent Paced: 0 %
Date Time Interrogation Session: 20240103025100
HighPow Impedance: 94 Ohm
Implantable Lead Connection Status: 753985
Implantable Lead Implant Date: 20230407
Implantable Lead Location: 753860
Implantable Lead Model: 138
Implantable Lead Serial Number: 303166
Implantable Pulse Generator Implant Date: 20230407
Lead Channel Impedance Value: 819 Ohm
Lead Channel Pacing Threshold Amplitude: 0.9 V
Lead Channel Pacing Threshold Pulse Width: 0.4 ms
Lead Channel Setting Pacing Amplitude: 2.5 V
Lead Channel Setting Pacing Pulse Width: 0.4 ms
Lead Channel Setting Sensing Sensitivity: 0.5 mV
Pulse Gen Serial Number: 216478
Zone Setting Status: 755011

## 2022-04-20 ENCOUNTER — Other Ambulatory Visit: Payer: Self-pay

## 2022-04-20 ENCOUNTER — Other Ambulatory Visit (HOSPITAL_COMMUNITY): Payer: Self-pay

## 2022-04-21 ENCOUNTER — Other Ambulatory Visit: Payer: Self-pay | Admitting: Internal Medicine

## 2022-04-23 ENCOUNTER — Other Ambulatory Visit (HOSPITAL_COMMUNITY): Payer: Self-pay

## 2022-04-23 MED ORDER — POTASSIUM CHLORIDE CRYS ER 20 MEQ PO TBCR
40.0000 meq | EXTENDED_RELEASE_TABLET | Freq: Two times a day (BID) | ORAL | 0 refills | Status: DC
  Filled 2022-04-23: qty 360, 90d supply, fill #0

## 2022-04-23 NOTE — Telephone Encounter (Signed)
Requested Prescriptions  Pending Prescriptions Disp Refills   potassium chloride SA (KLOR-CON M) 20 MEQ tablet 360 tablet 0    Sig: Take 2 tablets (40 mEq total) by mouth 2 (two) times daily.     Endocrinology:  Minerals - Potassium Supplementation Passed - 04/21/2022 12:13 PM      Passed - K in normal range and within 360 days    Potassium  Date Value Ref Range Status  03/20/2022 3.9 3.5 - 5.2 mmol/L Final         Passed - Cr in normal range and within 360 days    Creatinine  Date Value Ref Range Status  12/25/2021 0.90 0.44 - 1.00 mg/dL Final   Creatinine, Ser  Date Value Ref Range Status  03/20/2022 0.88 0.57 - 1.00 mg/dL Final         Passed - Valid encounter within last 12 months    Recent Outpatient Visits           1 month ago Leg cramps   [place], MD   3 months ago 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, NYHA class 2 ([place])   [place], MD   5 months ago Diarrhea, unspecified type   [place], MD   7 months ago [place], MD   8 months ago Dizziness and giddiness   [facility] at [facility], Loraine Grip, PA-C       Future Appointments             In 2 weeks Ladell Pier, MD [place]   In 2 weeks  [place]. [place]

## 2022-04-24 ENCOUNTER — Other Ambulatory Visit (HOSPITAL_COMMUNITY): Payer: Self-pay

## 2022-04-24 ENCOUNTER — Other Ambulatory Visit: Payer: Self-pay

## 2022-04-24 ENCOUNTER — Encounter: Payer: Self-pay | Admitting: Internal Medicine

## 2022-04-25 ENCOUNTER — Other Ambulatory Visit: Payer: Self-pay | Admitting: Gastroenterology

## 2022-04-25 ENCOUNTER — Other Ambulatory Visit (HOSPITAL_BASED_OUTPATIENT_CLINIC_OR_DEPARTMENT_OTHER): Payer: Self-pay

## 2022-04-25 ENCOUNTER — Other Ambulatory Visit (HOSPITAL_COMMUNITY): Payer: Self-pay

## 2022-04-26 ENCOUNTER — Other Ambulatory Visit (HOSPITAL_COMMUNITY): Payer: Self-pay

## 2022-04-26 ENCOUNTER — Telehealth: Payer: Self-pay | Admitting: Gastroenterology

## 2022-04-26 ENCOUNTER — Other Ambulatory Visit: Payer: Self-pay

## 2022-04-26 DIAGNOSIS — K50119 Crohn's disease of large intestine with unspecified complications: Secondary | ICD-10-CM

## 2022-04-26 MED ORDER — BUDESONIDE 3 MG PO CPEP
9.0000 mg | ORAL_CAPSULE | Freq: Every day | ORAL | 0 refills | Status: DC
  Filled 2022-04-26: qty 90, 30d supply, fill #0

## 2022-04-26 MED ORDER — BUDESONIDE 3 MG PO CPEP
9.0000 mg | ORAL_CAPSULE | Freq: Every day | ORAL | 0 refills | Status: DC
  Filled 2022-04-26: qty 90, 30d supply, fill #0
  Filled 2022-05-21: qty 78, 26d supply, fill #1

## 2022-04-26 MED FILL — Furosemide Tab 40 MG: ORAL | 30 days supply | Qty: 120 | Fill #2 | Status: AC

## 2022-04-26 NOTE — Telephone Encounter (Signed)
Pt reports diarrhea has stopped since taking creon. Pt states she is not having regular bowel movements and has a bm about every 4-6 days. Pt also states she will be out of Entocort on the 13th and wanted to know if she should continue this medication until her follow up appointment on 2/14.

## 2022-04-26 NOTE — Telephone Encounter (Signed)
Inbound call from patient stating that she been doing well with the Creon. Patient stated that her diarrhea have seemed to stop, but not able to have a BM like she is supposed to. Patient stated that she is in need for refill for Entocort. Please advise.

## 2022-04-26 NOTE — Telephone Encounter (Signed)
Good to hear that diarrhea stopped since starting Creon. 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 Let's recheck the ESR (CRP was normal) and continue Entocort for now, and start decreasing the Creon to monitor effect. Reduce to 1 cap with meals x1 week, and if improving, stop altogether. Keep f/u with me as scheduled.

## 2022-04-26 NOTE — Telephone Encounter (Signed)
Called pt and gave pt recommendations. Pt verbalized understanding. Order placed for ESR and Entocort refill sent to pt's pharmacy.

## 2022-04-27 ENCOUNTER — Other Ambulatory Visit (HOSPITAL_COMMUNITY): Payer: Self-pay

## 2022-05-04 NOTE — Progress Notes (Incomplete)
***In Progress***    Advanced Heart Failure Clinic Note   PCP: [place] Primary Cardiology: Dr Oval Linsey HF Cardiologist: Dr Haroldine Laws   HPI: Christine [name] is a 57 y.o.female with history of chronic tobacco use, ADHD, anxiety, depression, CAD, systolic HF.   Presented to ED 01/20/21 with increased shortness of breath/tachycardia. Adderrall stopped. Echo with EF 20-25%, LHC/RHC with single vessel CAD (occluded mLAD) elevated filling pressures and moderately reduced CO. Digoxin added. Beta blocker stopped. Discharged to home 01/25/21. Discharge weight 213 pounds.    Echo 05/11/21 EF < 20% LV severely dilated RV ok Mild MR. Referred for ICD.   CPX with very mild HF limitation with elevated Ve/VCO2 slope   S/p [employer] SCI ICD 07/2021.   Saw PCP 08/10/21 for dizziness and anxiety. Felt insomnia related, labs unremarkable.   Seen in ED 08/18/21 with sudden onset SOB. CXR and EKG re-assuring, Trop x 2 normal. BNP mildly up, advised admission to further work up. She declined, as symptoms completely resolved, and was elected to discharge home.   Seen in ED 10/06/21 for urinary retention, required foley. Felt to be 2/2 to prazosin and this was stopped.   Admitted (03/01/22) with CP and chronic diarrhea. Hstrop negative. Felt to have Crohn's disease. Losartan and spironolactone stopped due to low BP.    Follow up 02/2022, having palps and wearing Zio, spironolactone restarted, and started on bupropion.   Presented to clinic on 04/04/22 for HF follow up. Reported overall feeling fine. Main complaint was left leg weakness that started a few weeks prior, leg started to buckle while she walked. She attributed this to low potassium or possibly due to statin. Had occasional chest pain under left breast, short bursts that resolved spontaneously. Continued to feel palpitations, described as "fight or flight feeling." No SOB walking on flat ground. Dizzy when straining while having BM. Denied  abnormal bleeding, edema, or PND/Orthopnea. Appetite poor. No fever or chills. Weight at home 184 pounds. Reported taking all medications. Diarrhea had resolved. Took extra Lasix 40 mg about 3x/week.    Today she returns to HF clinic for pharmacist medication titration. At last visit with APP spironolactone was decreased to 12.5 mg daily.   Overall feeling ***. Dizziness, lightheadedness, fatigue:  Chest pain or palpitations:  How is your breathing?: *** SOB: Able to complete all ADLs. Activity level ***  Weight at home pounds. Takes furosemide/torsemide/bumex *** mg *** daily.  LEE PND/Orthopnea  Appetite *** Low-salt diet:   Physical Exam Cost/affordability of meds   HF Medications: Metoprolol succinate 25 mg daily Spironolactone 12.5 mg daily Farxiga 10 mg daily Digoxin 0.125 mg daily Ivabradine 5 mg BID Lasix 80 mg BID  Has the patient been experiencing any side effects to the medications prescribed?  {YES NO:22349}  Does the patient have any problems obtaining medications due to transportation or finances?   {YES NO:22349}  Understanding of regimen: {excellent/good/fair/poor:19665} Understanding of indications: {excellent/good/fair/poor:19665} Potential of compliance: {excellent/good/fair/poor:19665} Patient understands to avoid NSAIDs. Patient understands to avoid decongestants.    Pertinent Lab Values: Serum creatinine ***, BUN ***, Potassium ***, Sodium ***, BNP ***, Magnesium ***, Digoxin ***   Vital Signs: Weight: *** (last clinic weight: ***) Blood pressure: ***  Heart rate: ***   Assessment/Plan: 1. 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 Echo EF 20-25%, severely dilated LV, RV okay, moderate MR - R/LHC (10/22): 1v CAD occluded mid LAD PCWP 27,  Fick 4.7/2.  - cMRI EF 22%  subendocardial LGE consistent with prior infarcts in LV basal inferolateral wall, apical anterior/septal/inferior walls and apex. No viability. RV okay. Not sure  how to explain inferior defects on cMRI  - Echo (05/11/21) EF < 20% RV ok - CPX 1/23 with very mild HF limitation:  Peak VO2: 18.8 (90% predicted peak VO2) VE/VCO2 slope: 33 Peak RER: 1.14  - s/p [employer] ICD 4/23. - Stable NYHA III. Volume creeping up on HL.  - Continue Lasix 80 bid. Can take extra as needed - Decrease spironolactone back to 12.5 mg daily. - Continue Ivabradine 5 mg bid.  - Continue Toprol XL 25 mg daily. - Continue digoxin 0.125 mg daily. Last dig level 0.7 (01/17/22) - Continue Farxiga 10 mg daily.  - Hold off on adding losartan back with dizziness. - Needs to remain off adderall with reduced EF. - Recent labs OK   2. Palpitations - No longer on Adderall. Drinks 1 Pepsi a day  - Zio 14-day (11/22) showed mostly SR, no high-grade arrhythmias. - Zio 14 day (12/23) showed mostly SR, 2 short SVT runs, <1% VE's   3. CAD - Single vessel LAD occlusion on LHC. - No s/s ischemia - Continue statin.  - No ASA with Eliquis.   4. HLD - Last LDL 105, goal < 55. - Continue atorva 80. Can try alternating with 80 daily with 40 every other day to see if leg pain improves. - Refer to Lipid Clinic for possible PCSK9i.   5. Tobacco use - Remains quit from cigarettes.   6. LV apical thrombus - Noted on echo/cMRI.  - On Eliquis. No bleeding issues.    7. Diarrhea  - Following with GI. Felt to have Crohn's disease. - Improving.   8. OSA - Mild on sleep study, AHI 11.8 - Wearing CPAP   9. Polycythemia - Follows with Heme/Onc. - JAK negative.   10. AAA - CT scan 11/23 3.1 cm. - f/u u/s in 3 years recommended  Follow up ***  Audry Riles, PharmD, BCPS, BCCP, CPP Heart Failure Clinic Pharmacist [phone]

## 2022-05-07 ENCOUNTER — Ambulatory Visit (HOSPITAL_COMMUNITY)
Admission: RE | Admit: 2022-05-07 | Discharge: 2022-05-07 | Disposition: A | Discharge status: Home / Self Care | Payer: Medicaid Other | Source: Ambulatory Visit | Attending: Internal Medicine | Admitting: Internal Medicine

## 2022-05-07 ENCOUNTER — Telehealth (HOSPITAL_COMMUNITY): Payer: Self-pay | Admitting: Pharmacist

## 2022-05-07 ENCOUNTER — Other Ambulatory Visit (HOSPITAL_COMMUNITY): Payer: Self-pay

## 2022-05-07 VITALS — BP 96/68 | HR 65 | Wt 181.6 lb

## 2022-05-07 DIAGNOSIS — I110 Hypertensive heart disease with heart failure: Secondary | ICD-10-CM | POA: Diagnosis not present

## 2022-05-07 DIAGNOSIS — I2510 Atherosclerotic heart disease of native coronary artery without angina pectoris: Secondary | ICD-10-CM | POA: Insufficient documentation

## 2022-05-07 DIAGNOSIS — E785 Hyperlipidemia, unspecified: Secondary | ICD-10-CM | POA: Diagnosis not present

## 2022-05-07 DIAGNOSIS — G4733 Obstructive sleep apnea (adult) (pediatric): Secondary | ICD-10-CM | POA: Insufficient documentation

## 2022-05-07 DIAGNOSIS — I714 Abdominal aortic aneurysm, without rupture, unspecified: Secondary | ICD-10-CM | POA: Diagnosis not present

## 2022-05-07 DIAGNOSIS — I5022 Chronic systolic (congestive) heart failure: Secondary | ICD-10-CM | POA: Diagnosis not present

## 2022-05-07 DIAGNOSIS — Z720 Tobacco use: Secondary | ICD-10-CM | POA: Insufficient documentation

## 2022-05-07 DIAGNOSIS — I5042 Chronic combined systolic (congestive) and diastolic (congestive) heart failure: Secondary | ICD-10-CM

## 2022-05-07 DIAGNOSIS — I8290 Acute embolism and thrombosis of unspecified vein: Secondary | ICD-10-CM | POA: Insufficient documentation

## 2022-05-07 DIAGNOSIS — R197 Diarrhea, unspecified: Secondary | ICD-10-CM | POA: Diagnosis not present

## 2022-05-07 DIAGNOSIS — D751 Secondary polycythemia: Secondary | ICD-10-CM | POA: Diagnosis not present

## 2022-05-07 DIAGNOSIS — R002 Palpitations: Secondary | ICD-10-CM | POA: Insufficient documentation

## 2022-05-07 DIAGNOSIS — I5020 Unspecified systolic (congestive) heart failure: Secondary | ICD-10-CM

## 2022-05-07 LAB — BASIC METABOLIC PANEL
Anion gap: 9 (ref 5–15)
BUN: 16 mg/dL (ref 6–20)
CO2: 33 mmol/L — ABNORMAL HIGH (ref 22–32)
Calcium: 9.2 mg/dL (ref 8.9–10.3)
Chloride: 95 mmol/L — ABNORMAL LOW (ref 98–111)
Creatinine, Ser: 0.86 mg/dL (ref 0.44–1.00)
GFR, Estimated: >60 mL/min (ref >60)
Glucose, Bld: 120 mg/dL — ABNORMAL HIGH (ref 70–99)
Potassium: 3 mmol/L — ABNORMAL LOW (ref 3.5–5.1)
Sodium: 137 mmol/L (ref 135–145)

## 2022-05-07 MED ORDER — POTASSIUM CHLORIDE ER 10 MEQ PO TBCR
40.0000 meq | EXTENDED_RELEASE_TABLET | Freq: Two times a day (BID) | ORAL | 11 refills | Status: DC
  Filled 2022-05-07: qty 260, 30d supply, fill #0
  Filled 2022-06-01: qty 260, 30d supply, fill #1

## 2022-05-07 NOTE — Progress Notes (Signed)
Advanced Heart Failure Clinic Note   PCP: [place] Primary Cardiology: Dr Oval Linsey HF Cardiologist: Dr Haroldine Laws   HPI: Christine [name] is a 57 y.o.female with history of chronic tobacco use, ADHD, anxiety, depression, CAD, systolic HF.   Presented to ED 01/20/21 with increased shortness of breath/tachycardia. Adderrall stopped. Echo with EF 20-25%, LHC/RHC with single vessel CAD (occluded mLAD) elevated filling pressures and moderately reduced CO. Digoxin added. Beta blocker stopped. Discharged to home 01/25/21. Discharge weight 213 pounds.    Echo 05/11/21 EF < 20% LV severely dilated RV ok Mild MR. Referred for ICD.   CPX with very mild HF limitation with elevated Ve/VCO2 slope.   S/p [employer] SCI ICD 07/2021.   Saw PCP 08/10/21 for dizziness and anxiety. Felt insomnia related, labs unremarkable.   Seen in ED 08/18/21 with sudden onset SOB. CXR and EKG re-assuring, Trop x 2 normal. BNP mildly up, advised admission to further work up. She declined, as symptoms completely resolved, and was elected to discharge home.   Seen in ED 10/06/21 for urinary retention, required foley. Felt to be 2/2 to prazosin and this was stopped.   Admitted (03/01/22) with CP and chronic diarrhea. Hstrop negative. Felt to have Crohn's disease. Losartan and spironolactone stopped due to low BP.    Follow up 02/2022, was having palpitations and wearing Zio, spironolactone restarted, and started on bupropion.   Presented to clinic on 04/04/22 for HF follow up. Reported overall feeling fine. Main complaint was left leg weakness that started a few weeks prior, leg started to buckle while she walked. She attributed this to low potassium or possibly due to statin. Had occasional chest pain under left breast, short bursts that resolved spontaneously. Continued to feel palpitations, described as "fight or flight feeling." No SOB walking on flat ground. Dizzy when straining while having BM. Denied abnormal  bleeding, edema, or PND/Orthopnea. Appetite poor. No fever or chills. Weight at home 184 pounds. Reported taking all medications. Diarrhea had resolved. Took extra Lasix 40 mg about 3x/week.    Today she returns to HF clinic for pharmacist medication titration. At last visit with APP spironolactone was decreased to 12.5 mg daily. Today she is overall feeling okay. Denies dizziness. Endorses fatigue, feels like she has no energy. Denies CP. Endorses palpitations that feel like her heart is pounding. Reports pain under left breast has resolved. She reports increased SOB with exertion. She walks for ~25 minutes every evening with her fiance but does make frequent stops to sit and catch her breath. Weight at home is stable around 180 pounds currently, has lost about a pound/week. She is taking extra doses of Lasix 40 mg about 4-5 days/wk, when prompted by increased SOB or weight gain. Denies LEE. She sleeps with a bed wedge, but will start to feel SOB if laying down fully after one hour. Reports poor appetite, but attributes to GI issues. Endorses following a low-salt diet.  HF Medications: Metoprolol succinate 25 mg daily Spironolactone 12.5 mg daily Farxiga 10 mg daily Digoxin 0.125 mg daily Ivabradine 5 mg BID Lasix 80 mg BID  Has the patient been experiencing any side effects to the medications prescribed?  No  Does the patient have any problems obtaining medications due to transportation or finances?  No, insurance coverage through [facility].  Understanding of regimen: excellent Understanding of indications: excellent Potential of compliance: excellent Patient understands to avoid NSAIDs. Patient understands to avoid decongestants.    Pertinent Lab Values: 03/20/22: Serum  creatinine 0.88, BUN 17, Potassium 3.9, Sodium 142   Vital Signs: Weight: 181.6 lbs (last clinic weight: 184.8 lbs) Blood pressure: 96/68  Heart rate: 65   Assessment/Plan: 1. 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 Echo EF 20-25%, severely dilated LV, RV okay, moderate MR - R/LHC (01/2021): 1v CAD occluded mid LAD PCWP 27,  Fick 4.7/2.  - cMRI EF 22% subendocardial LGE consistent with prior infarcts in LV basal inferolateral wall, apical anterior/septal/inferior walls and apex. No viability. RV okay. Not sure how to explain inferior defects on cMRI  - Echo (05/11/21) EF < 20% RV ok - CPX 1/23 with very mild HF limitation:  Peak VO2: 18.8 (90% predicted peak VO2) VE/VCO2 slope: 33 Peak RER: 1.14  - s/p [employer] ICD 4/23. - Stable NYHA III. Appears euvolemic on exam.  - Continue Lasix 80 BID. Can take extra as needed - Continue metoprolol succinate 25 mg daily. - Continue spironolactone 12.5 mg daily. - Continue Farxiga 10 mg daily.  - Continue ivabradine 5 mg BID.  - Continue digoxin 0.125 mg daily. Last dig level 0.7 (01/17/22) - Hold off on adding losartan back given hypotension. - Needs to remain off adderall with reduced EF. - Encouraged her to check blood pressure and heart rate at home when experiencing palpitations and bring readings to next provider visit. - BMET today pending.    2. Palpitations - No longer on Adderall. Drinks 1 Pepsi a day  - Zio 14-day (02/2021) showed mostly SR, no high-grade arrhythmias. - Zio 14 day (03/2022) showed mostly SR, 2 short SVT runs, <1% VE's   3. CAD - Single vessel LAD occlusion on LHC. - No s/s ischemia - Continue statin.  - No ASA with Eliquis.   4. HLD - Last LDL 105, goal < 55. - Continue atorvastatin 80 mg.  - Referred to Lipid Clinic for possible PCSK9i.   5. Tobacco use - Remains quit from cigarettes.   6. LV apical thrombus - Noted on echo/cMRI.  - On Eliquis. No bleeding issues.    7. Diarrhea  - Following with GI. Felt to have Crohn's disease. - Improving.   8. OSA - Mild on sleep study, AHI 11.8 - Wearing CPAP   9. Polycythemia - Follows with Heme/Onc. - JAK negative.   10. AAA - CT scan  11/23 3.1 cm. - f/u u/s in 3 years recommended  Follow up in 2 months with MD.  Audry Riles, PharmD, BCPS, BCCP, CPP Heart Failure Clinic Pharmacist [phone]

## 2022-05-07 NOTE — Telephone Encounter (Signed)
Spoke with patient via telephone regarding her lab results. Potassium has decreased to 3 since stopping her spironolactone and taking extra as needed furosemide multiple times per week. Patient instructed to take an extra 20 meq tonight and tomorrow morning, then begin taking potassium 20 meq with each additional as needed furosemide dose. Patient voiced understanding. Patient requested potassium to be changed to 10 meq tablets due to difficulty swallowing 20 meq tablets.

## 2022-05-07 NOTE — Patient Instructions (Signed)
It was a pleasure seeing you today!  MEDICATIONS: -No medication changes today -Start measuring blood pressure and heart rate 2-3 times per week. Record results and bring to your next appointment with cardiology. -Call if you have questions about your medications.  LABS: -We will call you if your labs need attention.  In general, to take care of your heart failure: -Limit your fluid intake to 2 Liters (half-gallon) per day.   -Limit your salt intake to ideally 2-3 grams (2000-3000 mg) per day. -Weigh yourself daily and record, and bring that "weight diary" to your next appointment.  (Weight gain of 2-3 pounds in 1 day typically means fluid weight.) -The medications for your heart are to help your heart and help you live longer.   -Please contact us before stopping any of your heart medications.  Call the clinic at [phone] with questions or to reschedule future appointments.

## 2022-05-08 ENCOUNTER — Other Ambulatory Visit (INDEPENDENT_AMBULATORY_CARE_PROVIDER_SITE_OTHER): Payer: Medicaid Other

## 2022-05-08 ENCOUNTER — Other Ambulatory Visit (HOSPITAL_COMMUNITY): Payer: Self-pay

## 2022-05-08 DIAGNOSIS — K50119 Crohn's disease of large intestine with unspecified complications: Secondary | ICD-10-CM | POA: Diagnosis not present

## 2022-05-08 LAB — SEDIMENTATION RATE: Sed Rate: 57 mm/hr — ABNORMAL HIGH (ref 0–30)

## 2022-05-11 ENCOUNTER — Ambulatory Visit: Payer: Medicaid Other | Attending: Cardiovascular Disease | Admitting: Pharmacist

## 2022-05-11 ENCOUNTER — Encounter: Payer: Self-pay | Admitting: Internal Medicine

## 2022-05-11 ENCOUNTER — Ambulatory Visit: Payer: Medicaid Other | Attending: Internal Medicine | Admitting: Internal Medicine

## 2022-05-11 VITALS — BP 94/60 | HR 85 | Temp 97.7°F | Ht 70.0 in | Wt 181.0 lb

## 2022-05-11 DIAGNOSIS — K50118 Crohn's disease of large intestine with other complication: Secondary | ICD-10-CM | POA: Diagnosis not present

## 2022-05-11 DIAGNOSIS — I2510 Atherosclerotic heart disease of native coronary artery without angina pectoris: Secondary | ICD-10-CM | POA: Diagnosis not present

## 2022-05-11 DIAGNOSIS — Z720 Tobacco use: Secondary | ICD-10-CM | POA: Diagnosis not present

## 2022-05-11 DIAGNOSIS — I479 Paroxysmal tachycardia, unspecified: Secondary | ICD-10-CM

## 2022-05-11 DIAGNOSIS — I714 Abdominal aortic aneurysm, without rupture, unspecified: Secondary | ICD-10-CM | POA: Diagnosis not present

## 2022-05-11 DIAGNOSIS — G4733 Obstructive sleep apnea (adult) (pediatric): Secondary | ICD-10-CM

## 2022-05-11 DIAGNOSIS — E785 Hyperlipidemia, unspecified: Secondary | ICD-10-CM

## 2022-05-11 DIAGNOSIS — F320 Major depressive disorder, single episode, mild: Secondary | ICD-10-CM | POA: Diagnosis not present

## 2022-05-11 DIAGNOSIS — I5020 Unspecified systolic (congestive) heart failure: Secondary | ICD-10-CM | POA: Diagnosis not present

## 2022-05-11 DIAGNOSIS — Z23 Encounter for immunization: Secondary | ICD-10-CM | POA: Diagnosis not present

## 2022-05-11 NOTE — Progress Notes (Signed)
Patient ID: Christine [name], female    DOB: November 14, 1965  MRN: [id number]  CC: Follow-up (Systolic CHF. Christine [name] received flu vax.)   Subjective: Christine [name] is a 57 y.o. 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    CHF/CAD/LV thrombus/HL:  -a little bleeding gums at times on Eliquis.  Has appt with dentist in March -Some SOB at times with exertion.  No CP/LE edema/PND Wakes up in the mornings feeling heart beating hard for past 3-4 mths.  Wore heart monitor 02/2022.  This revealed predominant rhythm of sinus rhythm.  Highest heart rate was in the 160s with 2 short runs of SVT longest 1 lasting 4 beats.  She is on metoprolol. Reports compliance with atorvastatin 80 mg daily digoxin 0.125 mg daily, furosemide 80 mg twice a day, metoprolol 25 mg daily, potassium chloride 40 mEq twice a day, spironolactone 12.5 mg daily, Farxiga 10 mg daily  AAA: 3.1 cm infrarenal abdominal aortic aneurysm seen on imaging.  Needs repeat imaging/ultrasound in 2025-2026.  Patient asymptomatic. OSA:  using the CPAP consistently and finding it very helpful   anxiety, ADHD, depression, PTSD:  followed by Dr. Milderd Meager.  Currently on Buproprion, Ambien, Flexeril and Xanax.  Feels stable on her medications.  Crohn's Ds/Exocrine pancreatic and Insuff:  diarrhea has stopped completely 3 wks ago. Dr. 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[place]) 02/26/2022   Ulcer of ileum 02/26/2022   Diverticulosis of colon without hemorrhage 02/26/2022   Cardiomyopathy ([place]) ischemic and non-ischemic 01/16/2022   Abdominal aortic aneurysm (AAA) 3.0 cm to 5.0 cm in diameter in female ([place]) 01/09/2022   Polycythemia 01/09/2022   Chronic combined systolic and diastolic heart failure ([place]) 08/24/2021   CAD in native artery 08/24/2021   S/P ICD (internal cardiac defibrillator) procedure 08/10/2021   Former smoker 02/06/2021   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   Obesity (BMI 30.0-34.9) 02/06/2021   Anxiety 01/20/2021   Tobacco abuse 01/20/2021   Shortness of breath 01/20/2021   Chest tightness      Current Outpatient Medications on File Prior to Visit  Medication Sig Dispense Refill   acetaminophen (TYLENOL) 500 MG tablet Take 1,000 mg by mouth 3 (three) times daily as needed for moderate pain or headache.     ALPRAZolam (XANAX) 1 MG tablet Take 2 mg by mouth 3 (three) times daily.     alum & mag hydroxide-simeth (MAALOX/MYLANTA) 200-200-20 MG/5ML suspension Take 30 mLs by mouth every 4 (four) hours as needed for indigestion or heartburn. 355 mL 0   apixaban (ELIQUIS) 5 MG TABS tablet Take 1 tablet (5 mg total) by mouth 2 (two) times daily. 60 tablet 7   atorvastatin (LIPITOR) 80 MG tablet Take 1 tablet (80 mg total) by mouth daily. 90 tablet 3   budesonide (ENTOCORT EC)  3 MG 24 hr capsule Take 3 capsules (9 mg total) by mouth daily. 168 capsule 0   buPROPion (WELLBUTRIN XL) 150 MG 24 hr tablet Take 150 mg by mouth daily.     cyclobenzaprine (FLEXERIL) 10 MG tablet Take 20 mg by mouth at bedtime.     dapagliflozin propanediol (FARXIGA) 10 MG TABS tablet Take 1 tablet (10 mg total) by mouth daily. 30 tablet 11   digoxin (LANOXIN) 0.125 MG tablet Take 1 tablet (0.125 mg total) by mouth daily. 30 tablet 3    diphenhydrAMINE (BENADRYL) 25 MG tablet Take 50 mg by mouth as needed for allergies.     diphenoxylate-atropine (LOMOTIL) 2.5-0.025 MG tablet Take 2 tablets by mouth 4 (four) times daily as needed for diarrhea or loose stools. 56 tablet 0   feeding supplement (ENSURE ENLIVE / ENSURE PLUS) LIQD Take 237 mLs by mouth 2 (two) times daily between meals. 237 mL 12   furosemide (LASIX) 40 MG tablet Take 2 tablets (80 mg total) by mouth in the morning and every evening 120 tablet 3   ivabradine (CORLANOR) 5 MG TABS tablet Take 1 tablet (5 mg total) by mouth 2 (two) times daily. 60 tablet 6   lipase/protease/amylase (CREON) 36000 UNITS CPEP capsule Take 2 capsules ( 72000 units) with meals and 1 capsule ( 36000 units) with snack. (Patient taking differently: 36,000 Units 3 (three) times daily before meals. Take 1 capsules ( 72000 units) with meals) 300 capsule 5   metoprolol succinate (TOPROL-XL) 25 MG 24 hr tablet Take 1 tablet (25 mg total) by mouth daily. Take with or immediately following a meal. 30 tablet 11   metroNIDAZOLE (METROGEL) 1 % gel Apply topically as needed.     Multiple Vitamins-Minerals (MULTIVITAMIN WITH MINERALS) tablet Take 1 tablet by mouth daily.     oxymetazoline (AFRIN) 0.05 % nasal spray Place 1-2 sprays into both nostrils 2 (two) times daily as needed for congestion.     potassium chloride (KLOR-CON) 10 MEQ tablet Take 4 tablets (40 mEq total) by mouth 2 (two) times daily. Take an additional 2 tablets (20 meq total) as needed if furosemide is given. 260 tablet 11   promethazine (PHENERGAN) 25 MG tablet Take 1 tablet (25 mg total) by mouth every 6 (six) to 8 (eight) hours for nausea or vomiting. (Patient taking differently: Take 25 mg by mouth in the morning, at noon, and at bedtime.) 90 tablet 2   spironolactone (ALDACTONE) 25 MG tablet Take 1/2 tablet (12.5 mg total) by mouth daily. 45 tablet 3   zolpidem (AMBIEN) 10 MG tablet Take 5-10 mg by mouth at bedtime.     No current  facility-administered medications on file prior to visit.    Allergies  Allergen Reactions   Sulfa Antibiotics Hives, Itching and Rash   Erythromycin Hives   Tramadol Rash and Other (See Comments)    Urinary retention     Ibuprofen Itching   Tape Rash    Social History   Socioeconomic History   Marital status: Single    Spouse name: Not on file   Number of children: 0   Years of education: 34   Highest education level: Not on file  Occupational History   Not on file  Tobacco Use   Smoking status: Former    Packs/day: 0.50    Types: Cigarettes    Quit date: 01/15/2021    Years since quitting: 1.3   Smokeless tobacco: Never  Vaping Use   Vaping Use: Never  used  Substance and Sexual Activity   Alcohol use: Yes    Comment: rarely   Drug use: Never   Sexual activity: Not on file  Other Topics Concern   Not on file  Social History Narrative   Not on file   Social Determinants of Health   Financial Resource Strain: Medium Risk (01/31/2021)   Overall Financial Resource Strain (CARDIA)    Difficulty of Paying Living Expenses: Somewhat hard  Food Insecurity: No Food Insecurity (01/31/2021)   Hunger Vital Sign    Worried About Running Out of Food in the Last Year: Never true    Ran Out of Food in the Last Year: Never true  Transportation Needs: No Transportation Needs (01/31/2021)   PRAPARE - Hydrologist (Medical): No    Lack of Transportation (Non-Medical): No  Physical Activity: Not on file  Stress: Not on file  Social Connections: Not on file  Intimate Partner Violence: Not on file    Family History  Problem Relation Age of Onset   Stroke Mother    Atrial fibrillation Mother    Heart failure Father    Stomach cancer Maternal Grandfather    Liver cancer Neg Hx    Esophageal cancer Neg Hx    Colon polyps Neg Hx    Colon cancer Neg Hx     Past Surgical History:  Procedure Laterality Date   ABDOMINAL HYSTERECTOMY     BIOPSY   02/26/2022   Procedure: BIOPSY;  Surgeon: Lavena Bullion, DO;  Location: [facility];  Service: Gastroenterology;;   CARDIAC CATHETERIZATION     CHOLECYSTECTOMY     COLONOSCOPY WITH PROPOFOL N/A 02/26/2022   Procedure: COLONOSCOPY WITH PROPOFOL;  Surgeon: Lavena Bullion, DO;  Location: [facility];  Service: Gastroenterology;  Laterality: N/A;   ESOPHAGOGASTRODUODENOSCOPY (EGD) WITH PROPOFOL N/A 02/26/2022   Procedure: ESOPHAGOGASTRODUODENOSCOPY (EGD) WITH PROPOFOL;  Surgeon: Lavena Bullion, DO;  Location: [facility];  Service: Gastroenterology;  Laterality: N/A;   ICD IMPLANT N/A 07/21/2021   Procedure: ICD IMPLANT;  Surgeon: Deboraha Sprang, MD;  Location: [facility];  Service: Cardiovascular;  Laterality: N/A;   NECK SURGERY     OVARIAN CYST SURGERY     POLYPECTOMY  02/26/2022   Procedure: POLYPECTOMY;  Surgeon: Lavena Bullion, DO;  Location: [facility];  Service: Gastroenterology;;   RIGHT/LEFT HEART CATH AND CORONARY ANGIOGRAPHY N/A 01/23/2021   Procedure: RIGHT/LEFT HEART CATH AND CORONARY ANGIOGRAPHY;  Surgeon: Jolaine Artist, MD;  Location: [facility];  Service: Cardiovascular;  Laterality: N/A;    ROS: Review of Systems Negative except as stated above  PHYSICAL EXAM: BP 94/60 (BP Location: Left Arm, Patient Position: Sitting, Cuff Size: Normal)   Pulse 85   Temp 97.7 F (36.5 C) (Oral)   Ht '5\' 10"'$  (1.778 m)   Wt 181 lb (82.1 kg)   SpO2 98%   BMI 25.97 kg/m   Physical Exam  General appearance - alert, well appearing, middle-age Caucasian female and in no distress Mental status - normal mood, behavior, speech, dress, motor activity, and thought processes Neck - supple, no significant adenopathy Chest - clear to auscultation, no wheezes, rales or rhonchi, symmetric air entry Heart - normal rate, regular rhythm, normal S1, S2, no murmurs, rubs, clicks or gallops Extremities - peripheral pulses normal, no pedal edema, no clubbing or  cyanosis     05/11/2022   11:11 AM 03/20/2022    3:41 PM 01/09/2022  1:46 PM  Depression screen PHQ 2/9  Decreased Interest 2 1 0  Down, Depressed, Hopeless '1 1 2  '$ PHQ - 2 Score '3 2 2  '$ Altered sleeping '2 1 3  '$ Tired, decreased energy '3 1 3  '$ Change in appetite '2 1 3  '$ Feeling bad or failure about yourself  '1 1 1  '$ Trouble concentrating '2 3 1  '$ Moving slowly or fidgety/restless 1 1 0  Suicidal thoughts 0 0 0  PHQ-9 Score '14 10 13      '$ 05/11/2022   11:11 AM 03/20/2022    3:41 PM 01/09/2022    1:48 PM 11/23/2021   10:34 AM  GAD 7 : Generalized Anxiety Score  Nervous, Anxious, on Edge '3 3 3 3  '$ Control/stop worrying '2 2 3 3  '$ Worry too much - different things '2 2 3 3  '$ Trouble relaxing '3 3 2 2  '$ Restless 1 0 3 0  Easily annoyed or irritable '2 3 3 2  '$ Afraid - awful might happen 0 0 1 0  Total GAD 7 Score '13 13 18 13        '$ Latest Ref Rng & Units 05/07/2022    1:47 PM 03/20/2022    3:44 PM 03/16/2022    2:33 PM  CMP  Glucose 70 - 99 mg/dL 120  87  119   BUN 6 - 20 mg/dL '16  17  17   '$ Creatinine 0.44 - 1.00 mg/dL 0.86  0.88  0.81   Sodium 135 - 145 mmol/L 137  142  137   Potassium 3.5 - 5.1 mmol/L 3.0  3.9  3.6   Chloride 98 - 111 mmol/L 95  97  101   CO2 22 - 32 mmol/L 33  29  25   Calcium 8.9 - 10.3 mg/dL 9.2  9.7  9.9   Total Protein 6.5 - 8.1 g/dL   7.8   Total Bilirubin 0.3 - 1.2 mg/dL   0.4   Alkaline Phos 38 - 126 U/L   125   AST 15 - 41 U/L   19   ALT 0 - 44 U/L   17    Lipid Panel     Component Value Date/Time   CHOL 185 03/01/2022 0339   TRIG 221 (H) 03/01/2022 0339   HDL 36 (L) 03/01/2022 0339   CHOLHDL 5.1 03/01/2022 0339   VLDL 44 (H) 03/01/2022 0339   LDLCALC 105 (H) 03/01/2022 0339    CBC    Component Value Date/Time   WBC 14.2 (H) 03/16/2022 1433   RBC 5.20 (H) 03/16/2022 1433   HGB 15.2 (H) 03/16/2022 1433   HGB 14.9 12/25/2021 1119   HGB 17.5 (H) 08/10/2021 1023   HCT 44.8 03/16/2022 1433   HCT 50.7 (H) 08/10/2021 1023   PLT 411 (H) 03/16/2022  1433   PLT 326 12/25/2021 1119   PLT 374 08/10/2021 1023   MCV 86.2 03/16/2022 1433   MCV 86 08/10/2021 1023   MCH 29.2 03/16/2022 1433   MCHC 33.9 03/16/2022 1433   RDW 13.9 03/16/2022 1433   RDW 13.1 08/10/2021 1023   LYMPHSABS 1.8 03/16/2022 1433   LYMPHSABS 1.8 08/10/2021 1023   MONOABS 0.7 03/16/2022 1433   EOSABS 0.1 03/16/2022 1433   EOSABS 0.3 08/10/2021 1023   BASOSABS 0.1 03/16/2022 1433   BASOSABS 0.1 08/10/2021 1023    ASSESSMENT AND PLAN: 1. 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, NYHA class 2 (HCC) Stable.  Continue medications including Farxiga, spironolactone, metoprolol,  digoxin, furosemide  2. Coronary artery disease involving native coronary artery of native heart without angina pectoris Continue atorvastatin, metoprolol  3. Paroxysmal tachycardia, unspecified (HCC) Continue metoprolol.  Blood pressure too soft to increase dose  4. Crohn's disease of large intestine with other complication ([place]) Followed by gastroenterology.  On Entocort  5. Abdominal aortic aneurysm (AAA) 3.0 cm to 5.0 cm in diameter in female [facility]) We will continue to monitor with follow-up imaging in 1 to 2 years.  6. Mild major depression ([place]) Plugged in with behavioral health services and feels that she is stable on her current meds  7. OSA on CPAP Encouraged her to continue to use her CPAP.  She reports benefit from use.  8. Need for shingles vaccine Patient agreeable to receiving Shingrix vaccine series.  I went over possible side effects including injection site reaction and rare side effect of Ethelene Hal syndrome    Patient was given the opportunity to ask questions.  Patient verbalized understanding of the plan and was able to repeat key elements of the plan.   This documentation was completed using Radio producer.  Any transcriptional errors are unintentional.  No orders of the defined types were placed in this  encounter.    Requested Prescriptions    No prescriptions requested or ordered in this encounter    No follow-ups on file.  Karle Plumber, MD, FACP

## 2022-05-11 NOTE — Patient Instructions (Addendum)
It was nice meeting you today  We would like your LDL (bad cholesterol) to be less than 55  Please continue your atorvastatin '80mg'$  alternating with '40mg'$  daily  We would like to start a new medication called Repatha which you will inject once every 2 weeks  I will complete the prior authorization for you and contact you when approved  Once you start the medication we will recheck your cholesterol levels in 2-3 months  Please message or call with any questions  Karren Cobble, PharmD, [place], [place], [place], [place], [place], [address] Phone: [phone], Fax: [phone]

## 2022-05-11 NOTE — Progress Notes (Unsigned)
Patient ID: Christine [name]                 DOB: Jan 05, 1966                    MRN: [id number]     HPI: Christine [name] is a 57 y.o. female patient referred to lipid clinic by [facility]. PMH is significant for CHF, CAD, HTN, LV thrombus, Crohn's Disease, Ileitis, pancreatitis, and HLD. Last EF <20% 01/23. Currently managed on atorvastatin '80mg'$  and '40mg'$  on alternating days.  Patient presents today to discuss lipid management. Has been losing weight steadily through 2023. Colonoscopy revealed non traversable stricture in ileum and possible Crohn's disease. Treated with Entocort, Lomotil, Maalox, and Creon. Using Ensure for nutrition supplementation. Next follow up in 3 weeks.  Currently taking atorvastatin '80mg'$  and '40mg'$  on alternating days due to leg pain. Is tolerating well. Has not tried other lipid lowering therapies. Cath in 2022 showed 99% stenosis in mid LAD.  Currently on managed Medicaid and reports copays are manageable.  Current Medications:  Atorvastatin '80mg'$  and '40mg'$  EOD  Intolerances: N/A  Risk Factors:  CHF CAD Hx of smoking  LDL goal: <55  Labs: TC 185, Trigs 221, HDL 36, LDL 105 (03/01/22)  Past Medical History:  Diagnosis Date   Allergy    Anxiety 01/20/2021   CAD in native artery 08/24/2021   CHF (congestive heart failure) (HCC)    Chronic combined systolic and diastolic heart failure ([place]) 92/42/6834   Complication of anesthesia    Depression    Hyperlipidemia    Myocardial infarction [facility])    Presence of cardiac defibrillator 07/2021   Shortness of breath 01/20/2021   Sleep apnea 07/2021   Tobacco abuse 01/20/2021    Current Outpatient Medications on File Prior to Visit  Medication Sig Dispense Refill   acetaminophen (TYLENOL) 500 MG tablet Take 1,000 mg by mouth 3 (three) times daily as needed for moderate pain or headache.     ALPRAZolam (XANAX) 1 MG tablet Take 2 mg by mouth 3 (three) times daily.     alum & mag hydroxide-simeth  (MAALOX/MYLANTA) 200-200-20 MG/5ML suspension Take 30 mLs by mouth every 4 (four) hours as needed for indigestion or heartburn. 355 mL 0   apixaban (ELIQUIS) 5 MG TABS tablet Take 1 tablet (5 mg total) by mouth 2 (two) times daily. 60 tablet 7   atorvastatin (LIPITOR) 80 MG tablet Take 1 tablet (80 mg total) by mouth daily. 90 tablet 3   budesonide (ENTOCORT EC) 3 MG 24 hr capsule Take 3 capsules (9 mg total) by mouth daily. 168 capsule 0   buPROPion (WELLBUTRIN XL) 150 MG 24 hr tablet Take 150 mg by mouth daily.     cyclobenzaprine (FLEXERIL) 10 MG tablet Take 20 mg by mouth at bedtime.     dapagliflozin propanediol (FARXIGA) 10 MG TABS tablet Take 1 tablet (10 mg total) by mouth daily. 30 tablet 11   digoxin (LANOXIN) 0.125 MG tablet Take 1 tablet (0.125 mg total) by mouth daily. 30 tablet 3   diphenhydrAMINE (BENADRYL) 25 MG tablet Take 50 mg by mouth as needed for allergies.     diphenoxylate-atropine (LOMOTIL) 2.5-0.025 MG tablet Take 2 tablets by mouth 4 (four) times daily as needed for diarrhea or loose stools. 56 tablet 0   feeding supplement (ENSURE ENLIVE / ENSURE PLUS) LIQD Take 237 mLs by mouth 2 (two) times daily between meals. 237 mL 12   furosemide (LASIX) 40 MG tablet  Take 2 tablets (80 mg total) by mouth in the morning and every evening 120 tablet 3   ivabradine (CORLANOR) 5 MG TABS tablet Take 1 tablet (5 mg total) by mouth 2 (two) times daily. 60 tablet 6   lipase/protease/amylase (CREON) 36000 UNITS CPEP capsule Take 2 capsules ( 72000 units) with meals and 1 capsule ( 36000 units) with snack. (Patient taking differently: 36,000 Units 3 (three) times daily before meals. Take 1 capsules ( 72000 units) with meals) 300 capsule 5   metoprolol succinate (TOPROL-XL) 25 MG 24 hr tablet Take 1 tablet (25 mg total) by mouth daily. Take with or immediately following a meal. 30 tablet 11   metroNIDAZOLE (METROGEL) 1 % gel Apply topically as needed.     Multiple Vitamins-Minerals  (MULTIVITAMIN WITH MINERALS) tablet Take 1 tablet by mouth daily.     oxymetazoline (AFRIN) 0.05 % nasal spray Place 1-2 sprays into both nostrils 2 (two) times daily as needed for congestion.     potassium chloride (KLOR-CON) 10 MEQ tablet Take 4 tablets (40 mEq total) by mouth 2 (two) times daily. Take an additional 2 tablets (20 meq total) as needed if furosemide is given. 260 tablet 11   promethazine (PHENERGAN) 25 MG tablet Take 1 tablet (25 mg total) by mouth every 6 (six) to 8 (eight) hours for nausea or vomiting. (Patient taking differently: Take 25 mg by mouth in the morning, at noon, and at bedtime.) 90 tablet 2   spironolactone (ALDACTONE) 25 MG tablet Take 1/2 tablet (12.5 mg total) by mouth daily. 45 tablet 3   zolpidem (AMBIEN) 10 MG tablet Take 5-10 mg by mouth at bedtime.     No current facility-administered medications on file prior to visit.    Allergies  Allergen Reactions   Sulfa Antibiotics Hives, Itching and Rash   Erythromycin Hives   Tramadol Rash and Other (See Comments)    Urinary retention     Ibuprofen Itching   Tape Rash    Assessment/Plan:  1. Hyperlipidemia - Patient's most recent LDL 105 which is above goal of <55 despite high intensity statin treatment. Aggressive goal selected due to CAD and CHF. Discussed lipid lowering options in depth with patient. Advised that treatment most likely to get her to goal would be PCSK9i. Discussed that some Medicaid plans require patient trying ezetimibe first. However, do not feel she is a candidate due to ezetimibe's mechanism of action in small intestine. Will therefore pursue Repatha.   Using demo pen, educated patient on mechanism of action, storage, site selection, administration, and possible adverse effects. Patient able to demonstrate in room. Will complete PA and contact patient when approved. Recheck lipid panel in 2-3 months.  Continue Atorvastatin '80mg'$  alternating with '40mg'$  daily Start Repatha '140mg'$  q 2  weeks Recheck lipid panel in 2-3 months Follow up as needed  Karren Cobble, PharmD, [place], [place], [place], [place], [place], [address] Phone: [phone], Fax: [phone]

## 2022-05-12 ENCOUNTER — Telehealth: Payer: Self-pay | Admitting: Pharmacist

## 2022-05-12 ENCOUNTER — Encounter: Payer: Self-pay | Admitting: Pharmacist

## 2022-05-12 NOTE — Telephone Encounter (Signed)
PA for Repatha submitted. Key: [id number]

## 2022-05-14 ENCOUNTER — Other Ambulatory Visit (HOSPITAL_COMMUNITY): Payer: Self-pay

## 2022-05-14 NOTE — Telephone Encounter (Signed)
Pharmacy Patient Advocate Encounter  Received notification from [facility] that the request for prior authorization for Reaptha '140mg'$ /ml has been denied due to not meeting requirements.    Please be advised we currently do not have a Pharmacist to review denials, therefore you will need to process appeals accordingly as needed. Thanks for your support at this time.  Key #[id number]

## 2022-05-14 NOTE — Progress Notes (Signed)
Remote ICD transmission.   

## 2022-05-15 NOTE — Telephone Encounter (Signed)
Chart notes and labs sent to plan

## 2022-05-17 ENCOUNTER — Other Ambulatory Visit (HOSPITAL_COMMUNITY): Payer: Self-pay

## 2022-05-17 DIAGNOSIS — Z419 Encounter for procedure for purposes other than remedying health state, unspecified: Secondary | ICD-10-CM | POA: Diagnosis not present

## 2022-05-17 DIAGNOSIS — G4733 Obstructive sleep apnea (adult) (pediatric): Secondary | ICD-10-CM | POA: Diagnosis not present

## 2022-05-17 MED FILL — Furosemide Tab 40 MG: ORAL | 30 days supply | Qty: 120 | Fill #3 | Status: AC

## 2022-05-20 ENCOUNTER — Ambulatory Visit (INDEPENDENT_AMBULATORY_CARE_PROVIDER_SITE_OTHER): Payer: Medicaid Other

## 2022-05-20 ENCOUNTER — Ambulatory Visit (HOSPITAL_COMMUNITY)
Admission: EM | Admit: 2022-05-20 | Discharge: 2022-05-20 | Disposition: A | Discharge status: Home / Self Care | Payer: Medicaid Other | Attending: Internal Medicine | Admitting: Internal Medicine

## 2022-05-20 DIAGNOSIS — M79675 Pain in left toe(s): Secondary | ICD-10-CM | POA: Diagnosis not present

## 2022-05-20 DIAGNOSIS — S99922A Unspecified injury of left foot, initial encounter: Secondary | ICD-10-CM

## 2022-05-20 DIAGNOSIS — S90222A Contusion of left lesser toe(s) with damage to nail, initial encounter: Secondary | ICD-10-CM

## 2022-05-20 DIAGNOSIS — S90212A Contusion of left great toe with damage to nail, initial encounter: Secondary | ICD-10-CM

## 2022-05-20 NOTE — ED Triage Notes (Signed)
Pt reports injury to left big toe this afternoon. Pt reports dropping a heavy object on her toe.Last Tdap was 2023.

## 2022-05-20 NOTE — Discharge Instructions (Addendum)
I drained the blood blister in your toe today.  You may apply Epsom salt soaks to the toe to reduce further pain and inflammation.  The holes in your toenail due to the draining of the blood/fluid will continue to grow out with your toenail.  Your x-rays were negative for fracture.  If you develop any new or worsening symptoms or do not improve in the next 2 to 3 days, please return.  If your symptoms are severe, please go to the emergency room.  Follow-up with your primary care provider for further evaluation and management of your symptoms as well as ongoing wellness visits.  I hope you feel better!

## 2022-05-20 NOTE — ED Provider Notes (Incomplete)
[place]    CSN: [id number] Arrival date & time: 05/20/22  1721      History   Chief Complaint Chief Complaint  Patient presents with   Toe Injury    HPI Christine [name] is a 57 y.o. female.   Patient presents to urgent care for evaluation of right toe pain that started after she dropped a pizza pan on her right great toe.  She does take Eliquis and has not missed any doses.  Toe nail is purple/blue and painful.  Toenail is intact and does not lifted up on the sides of the nailbed.     Past Medical History:  Diagnosis Date   Allergy    Anxiety 01/20/2021   CAD in native artery 08/24/2021   CHF (congestive heart failure) (HCC)    Chronic combined systolic and diastolic heart failure ([place]) 37/90/2409   Complication of anesthesia    Depression    Hyperlipidemia    Myocardial infarction [facility])    Presence of cardiac defibrillator 07/2021   Shortness of breath 01/20/2021   Sleep apnea 07/2021   Tobacco abuse 01/20/2021    Patient Active Problem List   Diagnosis Date Noted   Crohn's disease of large intestine with other complication ([place]) 73/53/2992   Mild major depression ([place]) 05/11/2022   Paroxysmal tachycardia, unspecified ([place]) 05/11/2022   Essential hypertension 03/02/2022   Acute pancreatitis 03/01/2022   Hypokalemia 03/01/2022   Ileitis 03/01/2022   Diarrhea 02/26/2022   Nausea without vomiting 02/26/2022   Loss of weight 02/26/2022   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[place]) 02/26/2022   Ulcer of ileum 02/26/2022   Diverticulosis of colon without hemorrhage 02/26/2022   Cardiomyopathy ([place]) ischemic and non-ischemic 01/16/2022   Abdominal aortic aneurysm (AAA) 3.0 cm to 5.0 cm in diameter in female ([place]) 01/09/2022   Polycythemia 01/09/2022   Chronic combined systolic and diastolic heart failure ([place]) 08/24/2021   CAD in native artery 08/24/2021   S/P ICD (internal cardiac defibrillator) procedure 08/10/2021   Obstructive sleep apnea 05/05/2021   Former  smoker 02/06/2021   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   Obesity (BMI 30.0-34.9) 02/06/2021   Anxiety 01/20/2021   Tobacco abuse 01/20/2021   Shortness of breath 01/20/2021   Chest tightness     Past Surgical History:  Procedure Laterality Date   ABDOMINAL HYSTERECTOMY     BIOPSY  02/26/2022   Procedure: BIOPSY;  Surgeon: Lavena Bullion, DO;  Location: [facility];  Service: Gastroenterology;;   CARDIAC CATHETERIZATION     CHOLECYSTECTOMY     COLONOSCOPY WITH PROPOFOL N/A 02/26/2022   Procedure: COLONOSCOPY WITH PROPOFOL;  Surgeon: Lavena Bullion, DO;  Location: [facility];  Service: Gastroenterology;  Laterality: N/A;   ESOPHAGOGASTRODUODENOSCOPY (EGD) WITH PROPOFOL N/A 02/26/2022   Procedure: ESOPHAGOGASTRODUODENOSCOPY (EGD) WITH PROPOFOL;  Surgeon: Lavena Bullion, DO;  Location: [facility];  Service: Gastroenterology;  Laterality: N/A;   ICD IMPLANT N/A 07/21/2021   Procedure: ICD IMPLANT;  Surgeon: Deboraha Sprang, MD;  Location: [place] [facility];  Service: Cardiovascular;  Laterality: N/A;   NECK SURGERY     OVARIAN CYST SURGERY     POLYPECTOMY  02/26/2022   Procedure: POLYPECTOMY;  Surgeon: Lavena Bullion, DO;  Location: [facility];  Service: Gastroenterology;;   RIGHT/LEFT HEART CATH AND CORONARY ANGIOGRAPHY N/A 01/23/2021   Procedure: RIGHT/LEFT HEART CATH AND CORONARY ANGIOGRAPHY;  Surgeon: Jolaine Artist, MD;  Location: [place] [facility];  Service: Cardiovascular;  Laterality: N/A;  OB History     Gravida  1   Para      Term      Preterm      AB  1   Living         SAB      IAB      Ectopic      Multiple      Live Births               Home Medications    Prior to Admission medications   Medication Sig Start Date End Date Taking? Authorizing Provider  acetaminophen (TYLENOL) 500 MG tablet Take 1,000 mg by mouth 3 (three) times daily as needed for moderate pain or headache.    [provider]   ALPRAZolam Duanne Moron) 1 MG tablet Take 2 mg by mouth 3 (three) times daily.    [provider]  alum & mag hydroxide-simeth (MAALOX/MYLANTA) 200-200-20 MG/5ML suspension Take 30 mLs by mouth every 4 (four) hours as needed for indigestion or heartburn. 03/02/22   Atway, Rayann N, DO  apixaban (ELIQUIS) 5 MG TABS tablet Take 1 tablet (5 mg total) by mouth 2 (two) times daily. 11/10/21   Bensimhon, Shaune Pascal, MD  atorvastatin (LIPITOR) 80 MG tablet Take 1 tablet (80 mg total) by mouth daily. 01/19/22   Larey Dresser, MD  budesonide (ENTOCORT EC) 3 MG 24 hr capsule Take 3 capsules (9 mg total) by mouth daily. 04/26/22   Cirigliano, Vito V, DO  buPROPion (WELLBUTRIN XL) 150 MG 24 hr tablet Take 150 mg by mouth daily.    [provider]  cyclobenzaprine (FLEXERIL) 10 MG tablet Take 20 mg by mouth at bedtime.    [provider]  dapagliflozin propanediol (FARXIGA) 10 MG TABS tablet Take 1 tablet (10 mg total) by mouth daily. 10/20/21   Bensimhon, Shaune Pascal, MD  digoxin (LANOXIN) 0.125 MG tablet Take 1 tablet (0.125 mg total) by mouth daily. 02/12/22   Bensimhon, Shaune Pascal, MD  diphenhydrAMINE (BENADRYL) 25 MG tablet Take 50 mg by mouth as needed for allergies.    [provider]  diphenoxylate-atropine (LOMOTIL) 2.5-0.025 MG tablet Take 2 tablets by mouth 4 (four) times daily as needed for diarrhea or loose stools. 03/02/22   Atway, Rayann N, DO  feeding supplement (ENSURE ENLIVE / ENSURE PLUS) LIQD Take 237 mLs by mouth 2 (two) times daily between meals. 03/03/22   Atway, Rayann N, DO  furosemide (LASIX) 40 MG tablet Take 2 tablets (80 mg total) by mouth in the morning and every evening 03/15/22   Bensimhon, Shaune Pascal, MD  ivabradine (CORLANOR) 5 MG TABS tablet Take 1 tablet (5 mg total) by mouth 2 (two) times daily. 03/15/22   Ladell Pier, MD  lipase/protease/amylase (CREON) 36000 UNITS CPEP capsule Take 2 capsules ( 72000 units) with meals and 1 capsule ( 36000 units)  with snack. Patient taking differently: 36,000 Units 3 (three) times daily before meals. Take 1 capsules ( 72000 units) with meals 03/13/22   Cirigliano, Vito V, DO  metoprolol succinate (TOPROL-XL) 25 MG 24 hr tablet Take 1 tablet (25 mg total) by mouth daily. Take with or immediately following a meal. 06/26/21   Bensimhon, Shaune Pascal, MD  metroNIDAZOLE (METROGEL) 1 % gel Apply topically as needed.    [provider]  Multiple Vitamins-Minerals (MULTIVITAMIN WITH MINERALS) tablet Take 1 tablet by mouth daily.    [provider]  oxymetazoline (AFRIN) 0.05 % nasal spray Place 1-2 sprays  into both nostrils 2 (two) times daily as needed for congestion.    [provider]  potassium chloride (KLOR-CON) 10 MEQ tablet Take 4 tablets (40 mEq total) by mouth 2 (two) times daily. Take an additional 2 tablets (20 meq total) as needed if furosemide is given. 05/07/22   Bensimhon, Shaune Pascal, MD  promethazine (PHENERGAN) 25 MG tablet Take 1 tablet (25 mg total) by mouth every 6 (six) to 8 (eight) hours for nausea or vomiting. Patient taking differently: Take 25 mg by mouth in the morning, at noon, and at bedtime. 02/27/22   Levin Erp, PA  spironolactone (ALDACTONE) 25 MG tablet Take 1/2 tablet (12.5 mg total) by mouth daily. 04/04/22   [place], Maricela Bo, FNP  zolpidem (AMBIEN) 10 MG tablet Take 5-10 mg by mouth at bedtime.    [provider]    Family History Family History  Problem Relation Age of Onset   Stroke Mother    Atrial fibrillation Mother    Heart failure Father    Stomach cancer Maternal Grandfather    Liver cancer Neg Hx    Esophageal cancer Neg Hx    Colon polyps Neg Hx    Colon cancer Neg Hx     Social History Social History   Tobacco Use   Smoking status: Former    Packs/day: 0.50    Types: Cigarettes    Quit date: 01/15/2021    Years since quitting: 1.3   Smokeless tobacco: Never  Vaping Use   Vaping Use: Never used  Substance Use  Topics   Alcohol use: Yes    Comment: rarely   Drug use: Never     Allergies   Sulfa antibiotics, Erythromycin, Tramadol, Ibuprofen, and Tape   Review of Systems Review of Systems   Physical Exam Triage Vital Signs ED Triage Vitals [05/20/22 1758]  Enc Vitals Group     BP 99/65     Pulse Rate 80     Resp 18     Temp 98.3 F (36.8 C)     Temp Source Oral     SpO2 99 %     Weight      Height      Head Circumference      Peak Flow      Pain Score      Pain Loc      Pain Edu?      Excl. in [place]?    No data found.  Updated Vital Signs BP 99/65 (BP Location: Left Arm)   Pulse 80   Temp 98.3 F (36.8 C) (Oral)   Resp 18   SpO2 99%   Visual Acuity Right Eye Distance:   Left Eye Distance:   Bilateral Distance:    Right Eye Near:   Left Eye Near:    Bilateral Near:     Physical Exam   UC Treatments / Results  Labs (all labs ordered are listed, but only abnormal results are displayed) Labs Reviewed - No data to display  EKG   Radiology No results found.  Procedures Procedures (including critical care time)  Medications Ordered in UC Medications - No data to display  Initial Impression / Assessment and Plan / UC Course  I have reviewed the triage vital signs and the nursing notes.  Pertinent labs & imaging results that were available during my care of the patient were reviewed by me and considered in my medical decision making (see chart for details).     ***  Final Clinical Impressions(s) / UC Diagnoses   Final diagnoses:  None   Discharge Instructions   None    ED Prescriptions   None    PDMP not reviewed this encounter.

## 2022-05-21 ENCOUNTER — Other Ambulatory Visit (HOSPITAL_COMMUNITY): Payer: Self-pay

## 2022-05-25 ENCOUNTER — Encounter: Payer: Self-pay | Admitting: Internal Medicine

## 2022-05-25 ENCOUNTER — Other Ambulatory Visit (HOSPITAL_COMMUNITY): Payer: Self-pay

## 2022-05-29 NOTE — Telephone Encounter (Signed)
Patient calling in for update on medication. Please advise

## 2022-05-30 ENCOUNTER — Other Ambulatory Visit (HOSPITAL_COMMUNITY): Payer: Self-pay

## 2022-05-30 ENCOUNTER — Ambulatory Visit (INDEPENDENT_AMBULATORY_CARE_PROVIDER_SITE_OTHER): Payer: Medicaid Other | Admitting: Gastroenterology

## 2022-05-30 ENCOUNTER — Encounter (HOSPITAL_COMMUNITY): Payer: Self-pay | Admitting: Internal Medicine

## 2022-05-30 ENCOUNTER — Encounter: Payer: Self-pay | Admitting: Gastroenterology

## 2022-05-30 VITALS — BP 98/62 | HR 69 | Wt 180.0 lb

## 2022-05-30 DIAGNOSIS — R197 Diarrhea, unspecified: Secondary | ICD-10-CM | POA: Diagnosis not present

## 2022-05-30 DIAGNOSIS — K50119 Crohn's disease of large intestine with unspecified complications: Secondary | ICD-10-CM

## 2022-05-30 DIAGNOSIS — K8681 Exocrine pancreatic insufficiency: Secondary | ICD-10-CM

## 2022-05-30 MED ORDER — METOLAZONE 2.5 MG PO TABS
2.5000 mg | ORAL_TABLET | Freq: Every day | ORAL | 0 refills | Status: DC
  Filled 2022-05-30: qty 10, 10d supply, fill #0

## 2022-05-30 NOTE — Progress Notes (Unsigned)
Chief Complaint:    Constipation, Crohn's disease  GI History: Christine [name] is a 57 year old female with HFrEF (LVEF <20%) and CAD c/b LAD occlusion s/p ICD, chronic tobacco use,OSA on CPAP, history of LV thrombus on eliquis, and ADHD, anxiety/depression, and PTSD follows in the GI clinic for the following:   1) Diarrhea.  Started with watery diarrhea in 08/2021.  Index symptoms of 8-10 loose, watery stools with fecal urgency.  Not much improvement with Imodium.  No nocturnal symptoms.  Loss 6# with decreased p.o. intake. - 11/28/2021: ER evaluation for diarrhea.  WBC 13.3, ALT 56, ALP 127, otherwise unremarkable work-up.  Was given Keflex to treat UTI - 11/29/2021: Negative/normal GI PCR panel, O&P - 12/14/2021: Initial appointment in the GI clinic - 12/21/2021: CT A/P: Moderate colonic stool burden but otherwise no intra-abdominal pathology - 02/26/2022: EGD: Normal - 02/26/2022: Colonoscopy: 3 mm sigmoid HP, sigmoid diverticulosis, otherwise normal colon (path benign).  Terminal ileum with moderate stenosis measuring 1 cm in length by 5 mm in diameter which was not traversable.  This was located 3 cm from the ileocecal valve.  Terminal ileum contained a few ulcers proximal and distal to stricture (path: Acute ileitis with ulceration) - 03/01/2022: CT: No acute intra-abdominal pathology.  Stable 3.1 cm infrarenal abdominal aortic aneurysm.  Submucosal fatty infiltration in the terminal ileum with superimposed mucosal hyperemia suspicious for mild, acute on chronic terminal ileitis - 03/02/2022: CT enterography: Ileitis throughout a 15 cm segment of ileum with 2 segments of luminal narrowing/stricturing through this segment of distal ileum, c/w stricturing, ileal Crohn's Disease.  Negative/normal lactoferrin, calprotectin, repeat GI PCR panel. Fecal pancreatic elastase <50.  Was started on Entocort 9 mg/day x 8 weeks - 03/13/2022: GI follow-up appointment.  Reports overall decreased stool frequency  with Entocort.  ESR 52, normal CRP.  HPI:     Christine [name] is a 57 y.o. female presenting to the Gastroenterology Clinic for follow-up.  Was last seen by me on 03/13/2022.  Started on Creon for possible EPI and continued Entocort.  With both Creon and Entocort, developed constipation with BM every 4-6 days.  Decreased the Creon to 1 With meals and snacks.  However, still having straining BM every 4-6 days.  Christine [name] does not take the Creon at all, and she has fecal urgency and leakage.  She trialed Metamucil without much change in symptoms.  Still taking Entocort 9 mg daily.  Repeat ESR was still elevated at 57.  CRP was previously normal.     Review of systems:     No chest pain, no SOB, no fevers, no urinary sx   Past Medical History:  Diagnosis Date   Allergy    Anxiety 01/20/2021   CAD in native artery 08/24/2021   CHF (congestive heart failure) (HCC)    Chronic combined systolic and diastolic heart failure ([place]) [id number]   Complication of anesthesia    Depression    Hyperlipidemia    Myocardial infarction [facility])    Presence of cardiac defibrillator 07/2021   Shortness of breath 01/20/2021   Sleep apnea 07/2021   Tobacco abuse 01/20/2021    Christine [name]'s surgical history, family medical history, social history, medications and allergies were all reviewed in Epic    Current Outpatient Medications  Medication Sig Dispense Refill   acetaminophen (TYLENOL) 500 MG tablet Take 1,000 mg by mouth 3 (three) times daily as needed for moderate pain or headache.     ALPRAZolam (XANAX) 1 MG tablet Take 2 mg by mouth  3 (three) times daily.     alum & mag hydroxide-simeth (MAALOX/MYLANTA) 200-200-20 MG/5ML suspension Take 30 mLs by mouth every 4 (four) hours as needed for indigestion or heartburn. 355 mL 0   apixaban (ELIQUIS) 5 MG TABS tablet Take 1 tablet (5 mg total) by mouth 2 (two) times daily. 60 tablet 7   atorvastatin (LIPITOR) 80 MG tablet Take 1 tablet (80 mg total) by mouth daily. 90  tablet 3   budesonide (ENTOCORT EC) 3 MG 24 hr capsule Take 3 capsules (9 mg total) by mouth daily. 168 capsule 0   buPROPion (WELLBUTRIN XL) 150 MG 24 hr tablet Take 150 mg by mouth daily.     cyclobenzaprine (FLEXERIL) 10 MG tablet Take 20 mg by mouth at bedtime.     dapagliflozin propanediol (FARXIGA) 10 MG TABS tablet Take 1 tablet (10 mg total) by mouth daily. 30 tablet 11   digoxin (LANOXIN) 0.125 MG tablet Take 1 tablet (0.125 mg total) by mouth daily. 30 tablet 3   diphenhydrAMINE (BENADRYL) 25 MG tablet Take 50 mg by mouth as needed for allergies.     diphenoxylate-atropine (LOMOTIL) 2.5-0.025 MG tablet Take 2 tablets by mouth 4 (four) times daily as needed for diarrhea or loose stools. 56 tablet 0   feeding supplement (ENSURE ENLIVE / ENSURE PLUS) LIQD Take 237 mLs by mouth 2 (two) times daily between meals. 237 mL 12   furosemide (LASIX) 40 MG tablet Take 2 tablets (80 mg total) by mouth in the morning and every evening 120 tablet 3   ivabradine (CORLANOR) 5 MG TABS tablet Take 1 tablet (5 mg total) by mouth 2 (two) times daily. 60 tablet 6   lipase/protease/amylase (CREON) 36000 UNITS CPEP capsule Take 2 capsules ( 72000 units) with meals and 1 capsule ( 36000 units) with snack. (Christine [name] taking differently: 36,000 Units 3 (three) times daily before meals. Take 1 capsules ( 72000 units) with meals) 300 capsule 5   metolazone (ZAROXOLYN) 2.5 MG tablet Take 1 tablet (2.5 mg total) by mouth daily. 10 tablet 0   metoprolol succinate (TOPROL-XL) 25 MG 24 hr tablet Take 1 tablet (25 mg total) by mouth daily. Take with or immediately following a meal. 30 tablet 11   metroNIDAZOLE (METROGEL) 1 % gel Apply topically as needed.     Multiple Vitamins-Minerals (MULTIVITAMIN WITH MINERALS) tablet Take 1 tablet by mouth daily.     oxymetazoline (AFRIN) 0.05 % nasal spray Place 1-2 sprays into both nostrils 2 (two) times daily as needed for congestion.     potassium chloride (KLOR-CON) 10 MEQ tablet  Take 4 tablets (40 mEq total) by mouth 2 (two) times daily. Take an additional 2 tablets (20 meq total) as needed if furosemide is given. 260 tablet 11   promethazine (PHENERGAN) 25 MG tablet Take 1 tablet (25 mg total) by mouth every 6 (six) to 8 (eight) hours for nausea or vomiting. (Christine [name] taking differently: Take 25 mg by mouth in the morning, at noon, and at bedtime.) 90 tablet 2   spironolactone (ALDACTONE) 25 MG tablet Take 1/2 tablet (12.5 mg total) by mouth daily. 45 tablet 3   zolpidem (AMBIEN) 10 MG tablet Take 5-10 mg by mouth at bedtime.     No current facility-administered medications for this visit.    Physical Exam:     BP 98/62   Pulse 69   Wt 180 lb (81.6 kg)   BMI 25.83 kg/m   GENERAL:  Pleasant female in NAD PSYCH: : Cooperative,  normal affect GI: Mild TTP in LUQ along left lower ribs.  No concomitant abdominal tenderness. Musculoskeletal:  Normal muscle tone, normal strength.  Left lower rib tenderness as above NEURO: Alert and oriented x 3, no focal neurologic deficits   IMPRESSION and PLAN:    1) Crohn's Disease 2) Ileitis Continues to be a somewhat perplexing clinical case.  Colonoscopy with a known traversable stricture in the distal ileum with ulcers in the surrounding mucosa, and biopsies demonstrating acute ileitis.Subsequent CT enterography with 15 cm segment of ileitis and 2 discrete strictures in the distal ileum. This is certainly highly suspicious for Crohn's Disease (ileal, stricturing type), and still unclear if these are fixed strictures or if there is still an inflammatory component that has been responding to the Entocort.  ESR still elevated, but discussed the nonspecific nature of that lab.  Otherwise baseline fecal calprotectin and CRP were normal and less reliable noninvasive markers.  Superimposed on this is her very low pancreatic elastase which would be suggestive of EPI, but also discussed the possibility of a false positive.  Nonetheless,  she responded after adding Creon, and in fact now is more so dealing with constipation.  Plan for the following:  - Will wean budesonide and monitor for clinical change.  I asked her to update me in 4 weeks to let me know how she is doing after weaning the budesonide - If diarrhea recurs, would be more suspicious of Crohn's disease as the primary etiology for her underlying diarrhea, and even suggestive of inflammatory ileitis amenable to medical therapy.  In that case, would likely repeat CTE and repeat ESR - If no change/diarrhea does not recur, then may elect to wean the Creon as well, again likely with repeat imaging  3) Constipation As above, trying to parse out what component of the recent medication changes is inducing her constipation.  Will continue to monitor as above  4) LUQ pain Exam more consistent with MSK etiology.  Cannot do a trial of high-dose NSAIDs given her ileitis clinical status that is currently still in flux as above - Conservative measures such as heating pad discussed today - APAP ok to use - Can follow-up with PCM as well    RTC in 6 months or sooner prn   I spent 32 minutes of time, including in depth chart review, independent review of results as outlined above, communicating results with the Christine [name] directly, face-to-face time with the Christine [name], coordinating care, and ordering studies and medications as appropriate, and documentation.        Lavena Bullion ,DO, FACG 05/30/2022, 2:38 PM

## 2022-05-30 NOTE — Patient Instructions (Addendum)
Wean Entocort to 2 capsule daily for 2 weeks, then 1 capsule daily for 2 weeks, and then stop.  Call in 4 weeks with an update.  Please follow up in 6 months. Give [place] a call at [phone] to schedule an appointment.  _______________________________________________________  If your blood pressure at your visit was 140/90 or greater, please contact your primary care physician to follow up on this.  _______________________________________________________  If you are age 31 or older, your body mass index should be between 23-30. Your Body mass index is 25.83 kg/m. If this is out of the aforementioned range listed, please consider follow up with your Primary Care Provider.  If you are age 7 or younger, your body mass index should be between 19-25. Your Body mass index is 25.83 kg/m. If this is out of the aformentioned range listed, please consider follow up with your Primary Care Provider.   __________________________________________________________  The [place] [facility] providers would like to encourage you to use [facility] to communicate with providers for non-urgent requests or questions.  Due to long hold times on the telephone, sending your provider a message by [facility] may be a faster and more efficient way to get a response.  Please allow 48 business hours for a response.  Please remember that this is for non-urgent requests.    Due to recent changes in healthcare laws, you may see the results of your imaging and laboratory studies on MyChart before your provider has had a chance to review them.  We understand that in some cases there may be results that are confusing or concerning to you. Not all laboratory results come back in the same time frame and the provider may be waiting for multiple results in order to interpret others.  Please give [place] 48 hours in order for your provider to thoroughly review all the results before contacting the office for clarification of your results.    Thank you  for choosing me and [facility].  Vito Cirigliano, D.O.

## 2022-06-05 ENCOUNTER — Telehealth (HOSPITAL_COMMUNITY): Payer: Self-pay

## 2022-06-05 ENCOUNTER — Ambulatory Visit (HOSPITAL_COMMUNITY)
Admission: RE | Admit: 2022-06-05 | Discharge: 2022-06-05 | Disposition: A | Discharge status: Home / Self Care | Payer: Medicaid Other | Source: Ambulatory Visit | Attending: Cardiology | Admitting: Cardiology

## 2022-06-05 DIAGNOSIS — I5042 Chronic combined systolic (congestive) and diastolic (congestive) heart failure: Secondary | ICD-10-CM | POA: Insufficient documentation

## 2022-06-05 DIAGNOSIS — E876 Hypokalemia: Secondary | ICD-10-CM

## 2022-06-05 LAB — BASIC METABOLIC PANEL
Anion gap: 14 (ref 5–15)
BUN: 34 mg/dL — ABNORMAL HIGH (ref 6–20)
CO2: 40 mmol/L — ABNORMAL HIGH (ref 22–32)
Calcium: 9.9 mg/dL (ref 8.9–10.3)
Chloride: 79 mmol/L — ABNORMAL LOW (ref 98–111)
Creatinine, Ser: 1.04 mg/dL — ABNORMAL HIGH (ref 0.44–1.00)
GFR, Estimated: >60 mL/min (ref >60)
Glucose, Bld: 109 mg/dL — ABNORMAL HIGH (ref 70–99)
Potassium: 2.3 mmol/L — CL (ref 3.5–5.1)
Sodium: 133 mmol/L — ABNORMAL LOW (ref 135–145)

## 2022-06-05 LAB — BRAIN NATRIURETIC PEPTIDE: B Natriuretic Peptide: 47.7 pg/mL (ref 0.0–100.0)

## 2022-06-05 NOTE — Telephone Encounter (Signed)
Message had been sent to [facility] as well.  Left message for her to call me back

## 2022-06-05 NOTE — Telephone Encounter (Signed)
Bensimhon, Shaune Pascal, MD  Scarlette Calico, RN Who ordered this? We need to address asap. Would give k 160 today and adjust daily supplement as well.  Pt is currently taking:  Lasix 80 mg BID KCL 40 meq BID and takes an extra 20 meq when takes extra Lasix Spiro 12.5 mg Daily  She will take an extra 80 meq today, advised will discuss with provider and call her back with further dosing instructions

## 2022-06-05 NOTE — Telephone Encounter (Signed)
Lab called with critical potassium of 2.3

## 2022-06-05 NOTE — Telephone Encounter (Signed)
I spoke to [facility] and she was given instructions from Dr. Haroldine Laws already. She did not remember who she spoke to but they had called to have her increase her potassium today.

## 2022-06-06 ENCOUNTER — Other Ambulatory Visit: Payer: Self-pay | Admitting: Internal Medicine

## 2022-06-06 ENCOUNTER — Other Ambulatory Visit: Payer: Self-pay | Admitting: Physician Assistant

## 2022-06-06 DIAGNOSIS — I5020 Unspecified systolic (congestive) heart failure: Secondary | ICD-10-CM

## 2022-06-06 MED ORDER — SPIRONOLACTONE 25 MG PO TABS: 25.0000 mg | ORAL_TABLET | Freq: Every day | ORAL | 3 refills | Status: DC

## 2022-06-06 NOTE — Addendum Note (Signed)
Addended by: Scarlette Calico on: 06/06/2022 09:56 AM   Modules accepted: Orders

## 2022-06-06 NOTE — Telephone Encounter (Signed)
Marsh Dolly [place], RN 06/06/2022  9:54 AM EST Back to Top    Spoke w/pt, she confirmed she did take the extra 80 meq yesterday, she will increase Arlyce Harman today, repeat lab sch for 2/22   Rafael Bihari, [place] 06/06/2022  9:13 AM EST     *BMET tomorrow or Friday   [place], [place] 06/06/2022  8:07 AM EST     She needs to increase her spiro to 25 mg daily.   Please make sure she took extra 80 KCL (total of 160 KCL) yesterday. Needs BMET today and tomorrow

## 2022-06-07 ENCOUNTER — Ambulatory Visit (HOSPITAL_COMMUNITY)
Admission: RE | Admit: 2022-06-07 | Discharge: 2022-06-07 | Disposition: A | Discharge status: Home / Self Care | Payer: Medicaid Other | Source: Ambulatory Visit | Attending: Internal Medicine | Admitting: Internal Medicine

## 2022-06-07 ENCOUNTER — Other Ambulatory Visit: Payer: Self-pay

## 2022-06-07 ENCOUNTER — Other Ambulatory Visit (HOSPITAL_COMMUNITY): Payer: Self-pay

## 2022-06-07 ENCOUNTER — Telehealth (HOSPITAL_COMMUNITY): Payer: Self-pay | Admitting: *Deleted

## 2022-06-07 DIAGNOSIS — I5042 Chronic combined systolic (congestive) and diastolic (congestive) heart failure: Secondary | ICD-10-CM | POA: Insufficient documentation

## 2022-06-07 DIAGNOSIS — E876 Hypokalemia: Secondary | ICD-10-CM | POA: Insufficient documentation

## 2022-06-07 LAB — BASIC METABOLIC PANEL
Anion gap: 8 (ref 5–15)
BUN: 22 mg/dL — ABNORMAL HIGH (ref 6–20)
CO2: 35 mmol/L — ABNORMAL HIGH (ref 22–32)
Calcium: 9.1 mg/dL (ref 8.9–10.3)
Chloride: 91 mmol/L — ABNORMAL LOW (ref 98–111)
Creatinine, Ser: 0.9 mg/dL (ref 0.44–1.00)
GFR, Estimated: >60 mL/min (ref >60)
Glucose, Bld: 115 mg/dL — ABNORMAL HIGH (ref 70–99)
Potassium: 2.8 mmol/L — ABNORMAL LOW (ref 3.5–5.1)
Sodium: 134 mmol/L — ABNORMAL LOW (ref 135–145)

## 2022-06-07 MED ORDER — PROMETHAZINE HCL 25 MG PO TABS
25.0000 mg | ORAL_TABLET | Freq: Four times a day (QID) | ORAL | 2 refills | Status: DC
  Filled 2022-06-07: qty 90, 23d supply, fill #0
  Filled 2022-06-24: qty 90, 23d supply, fill #1
  Filled 2022-07-23: qty 90, 23d supply, fill #2

## 2022-06-07 MED ORDER — FUROSEMIDE 40 MG PO TABS
80.0000 mg | ORAL_TABLET | Freq: Two times a day (BID) | ORAL | 3 refills | Status: DC
  Filled 2022-06-07: qty 120, 30d supply, fill #0
  Filled 2022-06-24 – 2022-06-25 (×2): qty 120, 30d supply, fill #1
  Filled 2022-07-17: qty 120, 30d supply, fill #2
  Filled 2022-08-07: qty 120, 30d supply, fill #3

## 2022-06-07 MED ORDER — POTASSIUM CHLORIDE ER 10 MEQ PO TBCR: EXTENDED_RELEASE_TABLET | ORAL | 11 refills | Status: DC

## 2022-06-07 NOTE — Telephone Encounter (Signed)
Repeat K today 2.8   Per Dr Haroldine Laws pt should take an extra 160 meq of KCL today, starting tomorrow take KCL 80 meq in AM and 40 meq in PM, repeat bmet next week  Spoke w/pt, she is aware, agreeable, and verbalized understanding, repeat lab sch 2/28

## 2022-06-09 ENCOUNTER — Encounter: Payer: Self-pay | Admitting: Pharmacist

## 2022-06-12 ENCOUNTER — Other Ambulatory Visit (HOSPITAL_COMMUNITY): Payer: Self-pay

## 2022-06-12 ENCOUNTER — Encounter (HOSPITAL_COMMUNITY): Payer: Self-pay | Admitting: Internal Medicine

## 2022-06-12 DIAGNOSIS — I5042 Chronic combined systolic (congestive) and diastolic (congestive) heart failure: Secondary | ICD-10-CM

## 2022-06-12 MED ORDER — APIXABAN 5 MG PO TABS
5.0000 mg | ORAL_TABLET | Freq: Two times a day (BID) | ORAL | 4 refills | Status: DC
  Filled 2022-06-12: qty 60, 30d supply, fill #0
  Filled 2022-07-09: qty 60, 30d supply, fill #1
  Filled 2022-08-07: qty 60, 30d supply, fill #2
  Filled 2022-09-10: qty 60, 30d supply, fill #3
  Filled 2022-10-07: qty 60, 30d supply, fill #4

## 2022-06-12 MED ORDER — SPIRONOLACTONE 25 MG PO TABS: 25.0000 mg | ORAL_TABLET | Freq: Every day | ORAL | 3 refills | Status: DC

## 2022-06-13 ENCOUNTER — Ambulatory Visit (HOSPITAL_COMMUNITY)
Admission: RE | Admit: 2022-06-13 | Discharge: 2022-06-13 | Disposition: A | Discharge status: Home / Self Care | Payer: Medicaid Other | Source: Ambulatory Visit | Attending: Internal Medicine | Admitting: Internal Medicine

## 2022-06-13 ENCOUNTER — Telehealth (HOSPITAL_COMMUNITY): Payer: Self-pay

## 2022-06-13 DIAGNOSIS — I5042 Chronic combined systolic (congestive) and diastolic (congestive) heart failure: Secondary | ICD-10-CM | POA: Diagnosis not present

## 2022-06-13 DIAGNOSIS — E876 Hypokalemia: Secondary | ICD-10-CM

## 2022-06-13 LAB — BASIC METABOLIC PANEL
Anion gap: 13 (ref 5–15)
BUN: 37 mg/dL — ABNORMAL HIGH (ref 6–20)
CO2: 33 mmol/L — ABNORMAL HIGH (ref 22–32)
Calcium: 10 mg/dL (ref 8.9–10.3)
Chloride: 86 mmol/L — ABNORMAL LOW (ref 98–111)
Creatinine, Ser: 1.18 mg/dL — ABNORMAL HIGH (ref 0.44–1.00)
GFR, Estimated: 55 mL/min — ABNORMAL LOW (ref >60)
Glucose, Bld: 143 mg/dL — ABNORMAL HIGH (ref 70–99)
Potassium: 3 mmol/L — ABNORMAL LOW (ref 3.5–5.1)
Sodium: 132 mmol/L — ABNORMAL LOW (ref 135–145)

## 2022-06-13 LAB — MAGNESIUM: Magnesium: 2.6 mg/dL — ABNORMAL HIGH (ref 1.7–2.4)

## 2022-06-13 LAB — BRAIN NATRIURETIC PEPTIDE: B Natriuretic Peptide: 54.3 pg/mL (ref 0.0–100.0)

## 2022-06-14 ENCOUNTER — Other Ambulatory Visit (HOSPITAL_COMMUNITY): Payer: Self-pay | Admitting: Internal Medicine

## 2022-06-14 ENCOUNTER — Other Ambulatory Visit (HOSPITAL_COMMUNITY): Payer: Self-pay

## 2022-06-14 NOTE — Telephone Encounter (Signed)
Pt's med was sent

## 2022-06-15 ENCOUNTER — Encounter (HOSPITAL_COMMUNITY): Payer: Self-pay | Admitting: Internal Medicine

## 2022-06-15 DIAGNOSIS — G4733 Obstructive sleep apnea (adult) (pediatric): Secondary | ICD-10-CM | POA: Diagnosis not present

## 2022-06-15 DIAGNOSIS — Z419 Encounter for procedure for purposes other than remedying health state, unspecified: Secondary | ICD-10-CM | POA: Diagnosis not present

## 2022-06-16 ENCOUNTER — Other Ambulatory Visit (HOSPITAL_COMMUNITY): Payer: Self-pay | Admitting: Internal Medicine

## 2022-06-18 ENCOUNTER — Telehealth (HOSPITAL_COMMUNITY): Payer: Self-pay | Admitting: Cardiology

## 2022-06-18 ENCOUNTER — Other Ambulatory Visit (HOSPITAL_COMMUNITY): Payer: Self-pay

## 2022-06-18 ENCOUNTER — Telehealth: Payer: Self-pay

## 2022-06-18 ENCOUNTER — Encounter (HOSPITAL_COMMUNITY): Payer: Self-pay | Admitting: Internal Medicine

## 2022-06-18 DIAGNOSIS — I5042 Chronic combined systolic (congestive) and diastolic (congestive) heart failure: Secondary | ICD-10-CM

## 2022-06-18 MED ORDER — POTASSIUM CHLORIDE ER 10 MEQ PO TBCR: EXTENDED_RELEASE_TABLET | ORAL | 11 refills | Status: DC

## 2022-06-18 MED ORDER — SPIRONOLACTONE 25 MG PO TABS: 25.0000 mg | ORAL_TABLET | Freq: Every day | ORAL | 3 refills | Status: DC

## 2022-06-18 MED ORDER — DIGOXIN 125 MCG PO TABS
0.1250 mg | ORAL_TABLET | Freq: Every day | ORAL | 3 refills | Status: DC
  Filled 2022-06-18: qty 30, 30d supply, fill #0
  Filled 2022-07-13: qty 30, 30d supply, fill #1
  Filled 2022-08-07: qty 30, 30d supply, fill #2
  Filled 2022-08-22: qty 30, 30d supply, fill #3

## 2022-06-18 MED ORDER — SPIRONOLACTONE 25 MG PO TABS
25.0000 mg | ORAL_TABLET | Freq: Every day | ORAL | 3 refills | Status: DC
  Filled 2022-06-18: qty 45, 45d supply, fill #0
  Filled 2022-07-28: qty 45, 45d supply, fill #1
  Filled 2022-09-10: qty 45, 45d supply, fill #2
  Filled 2022-10-22: qty 45, 45d supply, fill #3

## 2022-06-18 MED ORDER — POTASSIUM CHLORIDE ER 10 MEQ PO TBCR
EXTENDED_RELEASE_TABLET | ORAL | 11 refills | Status: DC
  Filled 2022-06-18: qty 100, 9d supply, fill #0
  Filled 2022-06-18: qty 160, 13d supply, fill #0

## 2022-06-18 MED ORDER — POTASSIUM CHLORIDE ER 10 MEQ PO TBCR
80.0000 meq | EXTENDED_RELEASE_TABLET | Freq: Two times a day (BID) | ORAL | 11 refills | Status: DC
  Filled 2022-06-18 – 2022-06-19 (×3): qty 480, 30d supply, fill #0

## 2022-06-18 NOTE — Telephone Encounter (Signed)
Received request from Allena Katz, NP at HF clinic to assist patient with sending manual remote transmission for review.    Call to patient and explained reason for call.  She is sending manual transmission.  She has not been feeling well and reporting symptoms of SOB walking within her home, episodes of sweating 3-4 times a day and also waking her up at night, sometimes forgets what she is saying in the middle of sentence.  She is unsure if these symptoms are related to low potassium that she's had for the past month.  Advised patient copy of report will be sent to [facility] for review and HF clinic will call if there are any recommendations.  She appreciated the call.   3/4 [facility] HF index is 1 suggesting normal fluid levels.

## 2022-06-18 NOTE — Telephone Encounter (Signed)
-----   Message from Rafael Bihari, [place] sent at 06/18/2022  8:02 AM EST ----- Increase K to 80 bid and repeat BMEt on Friday

## 2022-06-18 NOTE — Telephone Encounter (Signed)
Patient called.  Patient aware.  

## 2022-06-19 ENCOUNTER — Other Ambulatory Visit: Payer: Self-pay

## 2022-06-19 ENCOUNTER — Encounter (HOSPITAL_COMMUNITY): Payer: Self-pay

## 2022-06-19 ENCOUNTER — Encounter (HOSPITAL_COMMUNITY): Payer: Self-pay | Admitting: Internal Medicine

## 2022-06-19 ENCOUNTER — Ambulatory Visit (HOSPITAL_COMMUNITY)
Admission: RE | Admit: 2022-06-19 | Discharge: 2022-06-19 | Disposition: A | Discharge status: Home / Self Care | Payer: Medicaid Other | Source: Ambulatory Visit | Attending: Family Medicine | Admitting: Family Medicine

## 2022-06-19 ENCOUNTER — Other Ambulatory Visit (HOSPITAL_COMMUNITY): Payer: Self-pay

## 2022-06-19 VITALS — BP 106/70 | HR 96 | Temp 98.4°F | Resp 20

## 2022-06-19 DIAGNOSIS — L03039 Cellulitis of unspecified toe: Secondary | ICD-10-CM

## 2022-06-19 DIAGNOSIS — M79674 Pain in right toe(s): Secondary | ICD-10-CM

## 2022-06-19 DIAGNOSIS — L03031 Cellulitis of right toe: Secondary | ICD-10-CM | POA: Diagnosis not present

## 2022-06-19 MED ORDER — HYDROCODONE-ACETAMINOPHEN 5-325 MG PO TABS
ORAL_TABLET | ORAL | Status: AC
  Filled 2022-06-19: qty 1

## 2022-06-19 MED ORDER — HYDROCODONE-ACETAMINOPHEN 5-325 MG PO TABS
1.0000 | ORAL_TABLET | Freq: Once | ORAL | Status: AC
  Administered 2022-06-19: 1 via ORAL

## 2022-06-19 MED ORDER — CEPHALEXIN 500 MG PO CAPS
500.0000 mg | ORAL_CAPSULE | Freq: Three times a day (TID) | ORAL | 0 refills | Status: DC
  Filled 2022-06-19: qty 20, 7d supply, fill #0

## 2022-06-19 NOTE — ED Provider Notes (Signed)
Christine [name]    CSN: [id number] Arrival date & time: 06/19/22  1245      History   Chief Complaint Chief Complaint  Patient presents with   Toe Pain    The nail of my big toe on my right foot seems to be peeling off. It's red and hot and hurts to wear a shoe or walk. - Entered by patient   Appointment    13:00    HPI Christine [name] is a 57 y.o. female.   Started with right great toe pain about 4 days ago.  The nail appears to be peeling from the toe.  Last night even a sheet was bother the toe. No injury.  No drainage noted;  Not wearing tight shoes, etc.  She is not a diabetic.        Past Medical History:  Diagnosis Date   Allergy    Anxiety 01/20/2021   CAD in native artery 08/24/2021   CHF (congestive heart failure) (HCC)    Chronic combined systolic and diastolic heart failure ([place]) [id number]   Complication of anesthesia    Depression    Hyperlipidemia    Myocardial infarction [facility])    Presence of cardiac defibrillator 07/2021   Shortness of breath 01/20/2021   Sleep apnea 07/2021   Tobacco abuse 01/20/2021    Patient Active Problem List   Diagnosis Date Noted   Crohn's disease of large intestine with other complication ([place]) 99991111   Mild major depression ([place]) 05/11/2022   Paroxysmal tachycardia, unspecified ([place]) 05/11/2022   Essential hypertension 03/02/2022   Acute pancreatitis 03/01/2022   Hypokalemia 03/01/2022   Ileitis 03/01/2022   Diarrhea 02/26/2022   Nausea without vomiting 02/26/2022   Loss of weight 02/26/2022   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   Ulcer of ileum 02/26/2022   Diverticulosis of colon without hemorrhage 02/26/2022   Cardiomyopathy ([place]) ischemic and non-ischemic 01/16/2022   Abdominal aortic aneurysm (AAA) 3.0 cm to 5.0 cm in diameter in female ([place]) 01/09/2022   Polycythemia 01/09/2022   Chronic combined systolic and diastolic heart failure ([place]) 08/24/2021   CAD in native artery 08/24/2021   S/P ICD  (internal cardiac defibrillator) procedure 08/10/2021   Obstructive sleep apnea 05/05/2021   Former smoker 02/06/2021   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   Obesity (BMI 30.0-34.9) 02/06/2021   Anxiety 01/20/2021   Tobacco abuse 01/20/2021   Shortness of breath 01/20/2021   Chest tightness     Past Surgical History:  Procedure Laterality Date   ABDOMINAL HYSTERECTOMY     BIOPSY  02/26/2022   Procedure: BIOPSY;  Surgeon: Lavena Bullion, DO;  Location: [facility];  Service: Gastroenterology;;   CARDIAC CATHETERIZATION     CHOLECYSTECTOMY     COLONOSCOPY WITH PROPOFOL N/A 02/26/2022   Procedure: COLONOSCOPY WITH PROPOFOL;  Surgeon: Lavena Bullion, DO;  Location: [facility];  Service: Gastroenterology;  Laterality: N/A;   ESOPHAGOGASTRODUODENOSCOPY (EGD) WITH PROPOFOL N/A 02/26/2022   Procedure: ESOPHAGOGASTRODUODENOSCOPY (EGD) WITH PROPOFOL;  Surgeon: Lavena Bullion, DO;  Location: [facility];  Service: Gastroenterology;  Laterality: N/A;   ICD IMPLANT N/A 07/21/2021   Procedure: ICD IMPLANT;  Surgeon: Deboraha Sprang, MD;  Location: [place] [facility];  Service: Cardiovascular;  Laterality: N/A;   NECK SURGERY     OVARIAN CYST SURGERY     POLYPECTOMY  02/26/2022   Procedure: POLYPECTOMY;  Surgeon: Lavena Bullion, DO;  Location: [facility];  Service: Gastroenterology;;   RIGHT/LEFT HEART CATH AND CORONARY  ANGIOGRAPHY N/A 01/23/2021   Procedure: RIGHT/LEFT HEART CATH AND CORONARY ANGIOGRAPHY;  Surgeon: Jolaine Artist, MD;  Location: [place] [facility];  Service: Cardiovascular;  Laterality: N/A;    OB History     Gravida  1   Para      Term      Preterm      AB  1   Living         SAB      IAB      Ectopic      Multiple      Live Births               Home Medications    Prior to Admission medications   Medication Sig Start Date End Date Taking? Authorizing Provider  acetaminophen (TYLENOL) 500 MG tablet Take 1,000 mg by  mouth 3 (three) times daily as needed for moderate pain or headache.    [provider]  ALPRAZolam Duanne Moron) 1 MG tablet Take 2 mg by mouth 3 (three) times daily.    [provider]  alum & mag hydroxide-simeth (MAALOX/MYLANTA) 200-200-20 MG/5ML suspension Take 30 mLs by mouth every 4 (four) hours as needed for indigestion or heartburn. 03/02/22   Atway, Rayann N, DO  apixaban (ELIQUIS) 5 MG TABS tablet Take 1 tablet (5 mg total) by mouth 2 (two) times daily. 06/12/22   Bensimhon, Shaune Pascal, MD  atorvastatin (LIPITOR) 80 MG tablet Take 1 tablet (80 mg total) by mouth daily. 01/19/22   Larey Dresser, MD  budesonide (ENTOCORT EC) 3 MG 24 hr capsule Take 3 capsules (9 mg total) by mouth daily. 04/26/22   Cirigliano, Vito V, DO  buPROPion (WELLBUTRIN XL) 150 MG 24 hr tablet Take 150 mg by mouth daily.    [provider]  cyclobenzaprine (FLEXERIL) 10 MG tablet Take 20 mg by mouth at bedtime.    [provider]  dapagliflozin propanediol (FARXIGA) 10 MG TABS tablet Take 1 tablet (10 mg total) by mouth daily. 10/20/21   Bensimhon, Shaune Pascal, MD  digoxin (LANOXIN) 0.125 MG tablet Take 1 tablet (0.125 mg total) by mouth daily. 06/18/22   Bensimhon, Shaune Pascal, MD  diphenhydrAMINE (BENADRYL) 25 MG tablet Take 50 mg by mouth as needed for allergies.    [provider]  diphenoxylate-atropine (LOMOTIL) 2.5-0.025 MG tablet Take 2 tablets by mouth 4 (four) times daily as needed for diarrhea or loose stools. 03/02/22   Atway, Rayann N, DO  feeding supplement (ENSURE ENLIVE / ENSURE PLUS) LIQD Take 237 mLs by mouth 2 (two) times daily between meals. 03/03/22   Atway, Rayann N, DO  furosemide (LASIX) 40 MG tablet Take 2 tablets (80 mg total) by mouth in the morning and every evening 06/07/22   Bensimhon, Shaune Pascal, MD  ivabradine (CORLANOR) 5 MG TABS tablet Take 1 tablet (5 mg total) by mouth 2 (two) times daily. 03/15/22   Ladell Pier, MD  lipase/protease/amylase (CREON)  36000 UNITS CPEP capsule Take 2 capsules ( 72000 units) with meals and 1 capsule ( 36000 units) with snack. Patient taking differently: 36,000 Units 3 (three) times daily before meals. Take 1 capsules ( 72000 units) with meals 03/13/22   Cirigliano, Vito V, DO  metolazone (ZAROXOLYN) 2.5 MG tablet Take 1 tablet (2.5 mg total) by mouth daily. 05/30/22 08/28/22  Rafael Bihari, FNP  metoprolol succinate (TOPROL-XL) 25 MG 24 hr tablet Take 1 tablet (25 mg total) by mouth daily. Take with or immediately  following a meal. 06/26/21   Bensimhon, Shaune Pascal, MD  metroNIDAZOLE (METROGEL) 1 % gel Apply topically as needed.    [provider]  Multiple Vitamins-Minerals (MULTIVITAMIN WITH MINERALS) tablet Take 1 tablet by mouth daily.    [provider]  oxymetazoline (AFRIN) 0.05 % nasal spray Place 1-2 sprays into both nostrils 2 (two) times daily as needed for congestion.    [provider]  potassium chloride (KLOR-CON) 10 MEQ tablet Take 8 tablets (80 mEq total) by mouth 2 (two) times daily. 06/18/22   [place], Maricela Bo, FNP  promethazine (PHENERGAN) 25 MG tablet Take 1 tablet (25 mg total) by mouth every 6 (six) to 8 (eight) hours for nausea or vomiting. 06/07/22   Cirigliano, Vito V, DO  spironolactone (ALDACTONE) 25 MG tablet Take 1 tablet (25 mg total) by mouth daily. 06/18/22   Bensimhon, Shaune Pascal, MD  zolpidem (AMBIEN) 10 MG tablet Take 5-10 mg by mouth at bedtime.    [provider]    Family History Family History  Problem Relation Age of Onset   Stroke Mother    Atrial fibrillation Mother    Heart failure Father    Stomach cancer Maternal Grandfather    Liver cancer Neg Hx    Esophageal cancer Neg Hx    Colon polyps Neg Hx    Colon cancer Neg Hx     Social History Social History   Tobacco Use   Smoking status: Former    Packs/day: 0.50    Types: Cigarettes    Quit date: 01/15/2021    Years since quitting: 1.4   Smokeless tobacco: Never  Vaping Use    Vaping Use: Never used  Substance Use Topics   Alcohol use: Yes    Comment: rarely   Drug use: Never     Allergies   Sulfa antibiotics, Erythromycin, Tramadol, Ibuprofen, and Tape   Review of Systems Review of Systems  Constitutional: Negative.   HENT: Negative.    Respiratory: Negative.    Cardiovascular: Negative.   Gastrointestinal: Negative.   Skin: Negative.      Physical Exam Triage Vital Signs ED Triage Vitals  Enc Vitals Group     BP 06/19/22 1306 106/70     Pulse Rate 06/19/22 1306 96     Resp 06/19/22 1306 20     Temp 06/19/22 1306 98.4 F (36.9 C)     Temp Source 06/19/22 1306 Oral     SpO2 06/19/22 1306 97 %     Weight --      Height --      Head Circumference --      Peak Flow --      Pain Score 06/19/22 1303 8     Pain Loc --      Pain Edu? --      Excl. in [place]? --    No data found.  Updated Vital Signs BP 106/70 (BP Location: Right Arm)   Pulse 96   Temp 98.4 F (36.9 C) (Oral)   Resp 20   SpO2 97%   Visual Acuity Right Eye Distance:   Left Eye Distance:   Bilateral Distance:    Right Eye Near:   Left Eye Near:    Bilateral Near:     Physical Exam Constitutional:      Appearance: Normal appearance.  Cardiovascular:     Rate and Rhythm: Normal rate.  Pulmonary:     Effort: Pulmonary effort is normal.  Musculoskeletal:  Comments: No obvious deformity to the right big toe;   The distal half of the toenail appears be peeling off in layers, but is still intact;  he is very tender to touch at the nail, as well as the surrounding toe  Neurological:     General: No focal deficit present.     Mental Status: She is alert.  Psychiatric:        Mood and Affect: Mood normal.      UC Treatments / Results  Labs (all labs ordered are listed, but only abnormal results are displayed) Labs Reviewed - No data to display  EKG   Radiology No results found.  Procedures Procedures (including critical care time)  Medications  Ordered in UC Medications  HYDROcodone-acetaminophen (NORCO/VICODIN) 5-325 MG per tablet 1 tablet (has no administration in time range)    Initial Impression / Assessment and Plan / UC Course  I have reviewed the triage vital signs and the nursing notes.  Pertinent labs & imaging results that were available during my care of the patient were reviewed by me and considered in my medical decision making (see chart for details).    Patient is seen today for toenail/toe pain.  Unclear etiology.  Does not appear totally fungal in nature, but could be infection in natures given her pain level.  Will treat with keflex, and referred to podiatry.  She has requested pain medication while here today.  Allergy to nsaids.  1 dose of norco given today.  Tylenol for pain otherwise.   Final Clinical Impressions(s) / UC Diagnoses   Final diagnoses:  Infection of toenail  Pain of toe of right foot     Discharge Instructions      You were seen today for toenail pain.  I am treating you with keflex taken three times/day x 7 days for possible infection. .  I recommend warm soaks as well.  I have placed a referral to podiatry, [facility] in [place].  You may call [phone] to make and appointment.     ED Prescriptions     Medication Sig Dispense Auth. Provider   cephALEXin (KEFLEX) 500 MG capsule Take 1 capsule (500 mg total) by mouth 3 (three) times daily. 20 capsule Rondel Oh, MD      PDMP not reviewed this encounter.   Rondel Oh, MD 06/19/22 1400

## 2022-06-19 NOTE — ED Notes (Signed)
Patient has a driver

## 2022-06-19 NOTE — ED Triage Notes (Addendum)
Right great toe pain, warm, red .  Reports toenail is pealing away.  Nail is separated in layers  Issues started Friday.  No known injury.

## 2022-06-19 NOTE — Discharge Instructions (Signed)
You were seen today for toenail pain.  I am treating you with keflex taken three times/day x 7 days for possible infection. .  I recommend warm soaks as well.  I have placed a referral to podiatry, [facility] in [place].  You may call [phone] to make and appointment.

## 2022-06-21 ENCOUNTER — Encounter: Payer: Self-pay | Admitting: Gastroenterology

## 2022-06-22 ENCOUNTER — Ambulatory Visit (INDEPENDENT_AMBULATORY_CARE_PROVIDER_SITE_OTHER): Payer: Medicaid Other | Admitting: Podiatry

## 2022-06-22 DIAGNOSIS — L600 Ingrowing nail: Secondary | ICD-10-CM | POA: Diagnosis not present

## 2022-06-22 NOTE — Progress Notes (Signed)
   Chief Complaint  Patient presents with   Nail Problem    NP- R Great toe nail coming off    Subjective: Patient presents today for evaluation of pain to the medial border of the right great toe. Patient is concerned for possible ingrown nail.  It is very sensitive to touch.  Patient also has a history of blunt trauma when she dropped a pan on her left great toe.  She does have a subungual hematoma which was treated and is currently nonpainful and asymptomatic.  Patient presents today for further treatment and evaluation.  Past Medical History:  Diagnosis Date   Allergy    Anxiety 01/20/2021   CAD in native artery 08/24/2021   CHF (congestive heart failure) (HCC)    Chronic combined systolic and diastolic heart failure ([place]) 37/16/9678   Complication of anesthesia    Depression    Hyperlipidemia    Myocardial infarction [facility])    Presence of cardiac defibrillator 07/2021   Shortness of breath 01/20/2021   Sleep apnea 07/2021   Tobacco abuse 01/20/2021    Objective:  General: Well developed, nourished, in no acute distress, alert and oriented x3   Dermatology: Skin is warm, dry and supple bilateral.  Medial border right great toe is tender with evidence of an ingrowing nail. Pain on palpation noted to the border of the nail fold. The remaining nails appear unremarkable at this time. There are no open sores, lesions. Stable subungual hematoma noted encompassing the majority of the left hallux nail plate.  It is nontender and asymptomatic.  No drainage.  Vascular: DP and PT pulses palpable.  No clinical evidence of vascular compromise  Neruologic: Grossly intact via light touch bilateral.  Musculoskeletal: No pedal deformity noted  Assesement: #1 Paronychia with ingrowing nail border right great toe #2 subungual hematoma left hallux nail plate; stable, asymptomatic  Plan of Care:  1. Patient evaluated.  2. Discussed treatment alternatives and plan of care. Explained nail  avulsion procedure and post procedure course to patient. 3. Patient opted for permanent partial nail avulsion of the ingrown portion of the nail.  4. Prior to procedure, local anesthesia infiltration utilized using 3 ml of a 50:50 mixture of 2% plain lidocaine and 0.5% plain marcaine in a normal hallux block fashion and a betadine prep performed.  5. Partial permanent nail avulsion with chemical matrixectomy performed using [id number] applications of phenol followed by alcohol flush.  6. Light dressing applied.  Post care instructions provided 7.  Return to clinic as needed  Edrick Kins, DPM [facility]  Dr. Edrick Kins, DPM    [address], [place] [address]                Office [phone]  Fax [phone]

## 2022-06-22 NOTE — Telephone Encounter (Signed)
With the diarrhea largely being at [facility] with the Entocort wean, I think we should continue with the weaning and potentially stopping and just continuing with Creon monotherapy for the time being.  We may need to consider repeat labs (ESR) and possibly repeat imaging such as CT enterography.  Otherwise the remainder of the symptoms described do not seem to be GI in origin.  Please schedule follow-up appointment with me to continue medication adjustment and monitoring.

## 2022-06-24 ENCOUNTER — Other Ambulatory Visit (HOSPITAL_COMMUNITY): Payer: Self-pay | Admitting: Family Medicine

## 2022-06-25 ENCOUNTER — Other Ambulatory Visit: Payer: Self-pay

## 2022-06-25 ENCOUNTER — Other Ambulatory Visit (HOSPITAL_COMMUNITY): Payer: Self-pay

## 2022-06-25 MED ORDER — METOLAZONE 2.5 MG PO TABS
2.5000 mg | ORAL_TABLET | Freq: Every day | ORAL | 0 refills | Status: DC
  Filled 2022-06-25: qty 3, 3d supply, fill #0

## 2022-06-26 ENCOUNTER — Telehealth (HOSPITAL_COMMUNITY): Payer: Self-pay

## 2022-06-26 ENCOUNTER — Ambulatory Visit (HOSPITAL_COMMUNITY)
Admission: RE | Admit: 2022-06-26 | Discharge: 2022-06-26 | Disposition: A | Discharge status: Home / Self Care | Payer: Medicaid Other | Source: Ambulatory Visit | Attending: Cardiology | Admitting: Cardiology

## 2022-06-26 ENCOUNTER — Other Ambulatory Visit (HOSPITAL_COMMUNITY): Payer: Self-pay

## 2022-06-26 ENCOUNTER — Telehealth: Payer: Self-pay | Admitting: Gastroenterology

## 2022-06-26 DIAGNOSIS — I5042 Chronic combined systolic (congestive) and diastolic (congestive) heart failure: Secondary | ICD-10-CM | POA: Diagnosis not present

## 2022-06-26 LAB — BASIC METABOLIC PANEL
Anion gap: 14 (ref 5–15)
BUN: 26 mg/dL — ABNORMAL HIGH (ref 6–20)
CO2: 33 mmol/L — ABNORMAL HIGH (ref 22–32)
Calcium: 10 mg/dL (ref 8.9–10.3)
Chloride: 87 mmol/L — ABNORMAL LOW (ref 98–111)
Creatinine, Ser: 1.07 mg/dL — ABNORMAL HIGH (ref 0.44–1.00)
GFR, Estimated: >60 mL/min (ref >60)
Glucose, Bld: 119 mg/dL — ABNORMAL HIGH (ref 70–99)
Potassium: 2.5 mmol/L — CL (ref 3.5–5.1)
Sodium: 134 mmol/L — ABNORMAL LOW (ref 135–145)

## 2022-06-26 NOTE — Telephone Encounter (Signed)
Patient aware of lab results. She verbalized understanding of medication changes. Labs ordered and scheduled.

## 2022-06-26 NOTE — Telephone Encounter (Signed)
Inbound call from pt , wanted to speak with a nurse she want to get a sooner appt .Please advise

## 2022-06-27 NOTE — Telephone Encounter (Signed)
This has been addressed. See recent telephone encounter

## 2022-06-27 NOTE — Telephone Encounter (Signed)
Patient called to follow up on message from yesterday states she still has not heard from anyone also would like to discuss having periods of sweating short of breath and really fatigued.

## 2022-06-27 NOTE — Telephone Encounter (Signed)
Called and spoke with patient regarding Dr. Vivia Ewing recommendations from 06/21/22 patient message. I apologized for the delay. Pt's appt has been rescheduled to Wednesday, 07/04/22 at 2:40 pm with Dr. Bryan Lemma. Pt states that she has only been tolerating liquids. I encouraged patient to try Boost or Ensure to make sure she is maintaining her calorie intake. I also advised pt that if she wishes to try more solid foods she can follow a BRAT diet. Pt verbalized understanding and had no concerns at the end of the call.

## 2022-06-28 ENCOUNTER — Ambulatory Visit (HOSPITAL_COMMUNITY)
Admission: RE | Admit: 2022-06-28 | Discharge: 2022-06-28 | Disposition: A | Discharge status: Home / Self Care | Payer: Medicaid Other | Source: Ambulatory Visit | Attending: Cardiology | Admitting: Cardiology

## 2022-06-28 DIAGNOSIS — I5042 Chronic combined systolic (congestive) and diastolic (congestive) heart failure: Secondary | ICD-10-CM | POA: Insufficient documentation

## 2022-06-28 LAB — BASIC METABOLIC PANEL
Anion gap: 14 (ref 5–15)
BUN: 38 mg/dL — ABNORMAL HIGH (ref 6–20)
CO2: 28 mmol/L (ref 22–32)
Calcium: 9.9 mg/dL (ref 8.9–10.3)
Chloride: 90 mmol/L — ABNORMAL LOW (ref 98–111)
Creatinine, Ser: 1.1 mg/dL — ABNORMAL HIGH (ref 0.44–1.00)
GFR, Estimated: 60 mL/min — ABNORMAL LOW (ref >60)
Glucose, Bld: 124 mg/dL — ABNORMAL HIGH (ref 70–99)
Potassium: 3.2 mmol/L — ABNORMAL LOW (ref 3.5–5.1)
Sodium: 132 mmol/L — ABNORMAL LOW (ref 135–145)

## 2022-06-28 LAB — MAGNESIUM: Magnesium: 2.2 mg/dL (ref 1.7–2.4)

## 2022-07-02 ENCOUNTER — Encounter (HOSPITAL_COMMUNITY): Payer: Self-pay | Admitting: Internal Medicine

## 2022-07-02 DIAGNOSIS — I5042 Chronic combined systolic (congestive) and diastolic (congestive) heart failure: Secondary | ICD-10-CM

## 2022-07-04 ENCOUNTER — Ambulatory Visit (HOSPITAL_COMMUNITY)
Admission: RE | Admit: 2022-07-04 | Discharge: 2022-07-04 | Disposition: A | Discharge status: Home / Self Care | Payer: Medicaid Other | Source: Ambulatory Visit | Attending: Cardiology | Admitting: Cardiology

## 2022-07-04 ENCOUNTER — Encounter: Payer: Self-pay | Admitting: Gastroenterology

## 2022-07-04 ENCOUNTER — Other Ambulatory Visit (INDEPENDENT_AMBULATORY_CARE_PROVIDER_SITE_OTHER): Payer: Medicaid Other

## 2022-07-04 ENCOUNTER — Ambulatory Visit (INDEPENDENT_AMBULATORY_CARE_PROVIDER_SITE_OTHER): Payer: Medicaid Other | Admitting: Gastroenterology

## 2022-07-04 VITALS — BP 97/60 | HR 91 | Ht 70.0 in | Wt 170.0 lb

## 2022-07-04 DIAGNOSIS — K50119 Crohn's disease of large intestine with unspecified complications: Secondary | ICD-10-CM

## 2022-07-04 DIAGNOSIS — I5042 Chronic combined systolic (congestive) and diastolic (congestive) heart failure: Secondary | ICD-10-CM | POA: Diagnosis not present

## 2022-07-04 DIAGNOSIS — R110 Nausea: Secondary | ICD-10-CM | POA: Diagnosis not present

## 2022-07-04 DIAGNOSIS — K8681 Exocrine pancreatic insufficiency: Secondary | ICD-10-CM | POA: Diagnosis not present

## 2022-07-04 DIAGNOSIS — R5383 Other fatigue: Secondary | ICD-10-CM

## 2022-07-04 LAB — BASIC METABOLIC PANEL
Anion gap: 13 (ref 5–15)
BUN: 27 mg/dL — ABNORMAL HIGH (ref 6–20)
CO2: 30 mmol/L (ref 22–32)
Calcium: 9.5 mg/dL (ref 8.9–10.3)
Chloride: 89 mmol/L — ABNORMAL LOW (ref 98–111)
Creatinine, Ser: 1.08 mg/dL — ABNORMAL HIGH (ref 0.44–1.00)
GFR, Estimated: >60 mL/min (ref >60)
Glucose, Bld: 143 mg/dL — ABNORMAL HIGH (ref 70–99)
Potassium: 3.2 mmol/L — ABNORMAL LOW (ref 3.5–5.1)
Sodium: 132 mmol/L — ABNORMAL LOW (ref 135–145)

## 2022-07-04 LAB — SEDIMENTATION RATE: Sed Rate: 44 mm/hr — ABNORMAL HIGH (ref 0–30)

## 2022-07-04 NOTE — Patient Instructions (Addendum)
Your provider has requested that you go to the basement level for lab work before leaving today. Press "B" on the elevator. The lab is located at the first door on the left as you exit the elevator.   You have been scheduled for an appointment with Dr. Bryan Lemma  on 10/04/22 at 1:40 PM . Please arrive 10 minutes early for your appointment.   You have been scheduled for a CT Enterography scan of the abdomen and pelvis at [facility] ([address], [place], [place] [address]).   You are scheduled on 07/18/22 at 530 pm. You should arrive at 3:45 pm minutes prior to your appointment time for registration. Please follow the written instructions below on the day of your exam:  WARNING: IF YOU ARE ALLERGIC TO IODINE/X-RAY DYE, PLEASE NOTIFY RADIOLOGY IMMEDIATELY AT [phone]! YOU WILL BE GIVEN A 13 HOUR PREMEDICATION PREP.  1) Do not eat or drink anything after 130 pm (4 hours prior to your test)     You may take any medications as prescribed with a small amount of water, if necessary. If you take any of the following medications: METFORMIN, GLUCOPHAGE, GLUCOVANCE, AVANDAMET, RIOMET, FORTAMET, [place] MET, JANUMET, GLUMETZA or METAGLIP, you MAY be asked to HOLD this medication 48 hours AFTER the exam.  The purpose of you drinking the oral contrast is to aid in the visualization of your intestinal tract. The contrast solution may cause some diarrhea. Depending on your individual set of symptoms, you may also receive an intravenous injection of x-ray contrast/dye. Plan on being at [facility] for 30 minutes or longer, depending on the type of exam you are having performed.  This test typically takes 30-45 minutes to complete.  If you have any questions regarding your exam or if you need to reschedule, you may call the CT department at [phone] between the hours of 8:00 am and 5:00 pm, Monday-Friday.  ________________________________________________________________________

## 2022-07-04 NOTE — Progress Notes (Unsigned)
Chief Complaint:    Abdominal pain  GI History: [name] is a 57 year old female with HFrEF (LVEF <20%) and CAD c/b LAD occlusion s/p ICD, chronic tobacco use,OSA on CPAP, history of LV thrombus on eliquis, and ADHD, anxiety/depression, and PTSD follows in the GI clinic for the following:   1) Crohn's Disease (stricturing ileitis).  Started with watery diarrhea in 08/2021.  Index symptoms of 8-10 loose, watery stools with fecal urgency.  No nocturnal symptoms.  Loss 6# with decreased p.o. intake. - 11/28/2021: ER evaluation for diarrhea.  WBC 13.3, ALT 56, ALP 127, otherwise unremarkable work-up.  Was given Keflex to treat UTI - 11/29/2021: Negative/normal GI PCR panel, O&P - 12/14/2021: Initial appointment in the GI clinic - 12/21/2021: CT A/P: Moderate colonic stool burden but otherwise no intra-abdominal pathology - 02/26/2022: EGD: Normal - 02/26/2022: Colonoscopy: 3 mm sigmoid HP, sigmoid diverticulosis, otherwise normal colon (path benign).  Terminal ileum with moderate stenosis measuring 1 cm in length by 5 mm in diameter which was not traversable.  This was located 3 cm from the ileocecal valve.  Terminal ileum contained a few ulcers proximal and distal to stricture (path: Acute ileitis with ulceration) - 03/01/2022: CT: No acute intra-abdominal pathology.  Stable 3.1 cm infrarenal abdominal aortic aneurysm.  Submucosal fatty infiltration in the terminal ileum with superimposed mucosal hyperemia suspicious for mild, acute on chronic terminal ileitis - 03/02/2022: CT enterography: Ileitis throughout a 15 cm segment of ileum with 2 segments of luminal narrowing/stricturing through this segment of distal ileum, c/w stricturing, ileal Crohn's Disease.  Negative/normal lactoferrin, calprotectin, repeat GI PCR panel. Fecal pancreatic elastase <50.  Was started on Entocort 9 mg/day x 8 weeks - 03/13/2022: GI follow-up appointment.  Reports overall decreased stool frequency with Entocort.  ESR 52,  normal CRP.  HPI:     Patient is a 57 y.o. female presenting to the Gastroenterology Clinic for follow-up.  Was last seen by me on 05/30/2022.  Had developed constipation when taking both Creon and Entocort which improved after decreasing the Creon.  Recommended weaning budesonide and diarrhea did not return. Wean completed yesterday, and now Creon monotherapy alone (1 cap with meals)..  Main issue today is intermittent, sharp LLQ pain and lower abdominal cramping.  For the last 2 weeks with nausea and fatigue and SOB. BMP with K 2.3, and Cardiologist increased her K supplement. Repeat K+ 3.2 today.   Still with nausea, sweats, fatigue.  Occasional lower abdominal cramping.  No return of diarrhea. Nausea improves with Phenergan.    Review of systems:     No chest pain, no SOB, no fevers, no urinary sx   Past Medical History:  Diagnosis Date   Allergy    Anxiety 01/20/2021   CAD in native artery 08/24/2021   CHF (congestive heart failure) (HCC)    Chronic combined systolic and diastolic heart failure ([place]) [id number]   Complication of anesthesia    Depression    Hyperlipidemia    Myocardial infarction [facility])    Presence of cardiac defibrillator 07/2021   Shortness of breath 01/20/2021   Sleep apnea 07/2021   Tobacco abuse 01/20/2021    Patient's surgical history, family medical history, social history, medications and allergies were all reviewed in Epic    Current Outpatient Medications  Medication Sig Dispense Refill   acetaminophen (TYLENOL) 500 MG tablet Take 1,000 mg by mouth 3 (three) times daily as needed for moderate pain or headache.     ALPRAZolam (XANAX) 1 MG tablet  Take 2 mg by mouth 3 (three) times daily.     alum & mag hydroxide-simeth (MAALOX/MYLANTA) 200-200-20 MG/5ML suspension Take 30 mLs by mouth every 4 (four) hours as needed for indigestion or heartburn. 355 mL 0   apixaban (ELIQUIS) 5 MG TABS tablet Take 1 tablet (5 mg total) by mouth 2 (two) times daily. 60  tablet 4   atorvastatin (LIPITOR) 80 MG tablet Take 1 tablet (80 mg total) by mouth daily. 90 tablet 3   budesonide (ENTOCORT EC) 3 MG 24 hr capsule Take 3 capsules (9 mg total) by mouth daily. 168 capsule 0   buPROPion (WELLBUTRIN XL) 150 MG 24 hr tablet Take 150 mg by mouth daily.     cyclobenzaprine (FLEXERIL) 10 MG tablet Take 20 mg by mouth at bedtime.     dapagliflozin propanediol (FARXIGA) 10 MG TABS tablet Take 1 tablet (10 mg total) by mouth daily. 30 tablet 11   digoxin (LANOXIN) 0.125 MG tablet Take 1 tablet (0.125 mg total) by mouth daily. 30 tablet 3   diphenhydrAMINE (BENADRYL) 25 MG tablet Take 50 mg by mouth as needed for allergies.     diphenoxylate-atropine (LOMOTIL) 2.5-0.025 MG tablet Take 2 tablets by mouth 4 (four) times daily as needed for diarrhea or loose stools. 56 tablet 0   feeding supplement (ENSURE ENLIVE / ENSURE PLUS) LIQD Take 237 mLs by mouth 2 (two) times daily between meals. 237 mL 12   furosemide (LASIX) 40 MG tablet Take 2 tablets (80 mg total) by mouth in the morning and every evening 120 tablet 3   ivabradine (CORLANOR) 5 MG TABS tablet Take 1 tablet (5 mg total) by mouth 2 (two) times daily. 60 tablet 6   lipase/protease/amylase (CREON) 36000 UNITS CPEP capsule Take 2 capsules ( 72000 units) with meals and 1 capsule ( 36000 units) with snack. (Patient taking differently: 36,000 Units 3 (three) times daily before meals. Take 1 capsules ( 72000 units) with meals) 300 capsule 5   metolazone (ZAROXOLYN) 2.5 MG tablet Take 1 tablet (2.5 mg total) by mouth daily. 3 tablet 0   metoprolol succinate (TOPROL-XL) 25 MG 24 hr tablet Take 1 tablet (25 mg total) by mouth daily. Take with or immediately following a meal. 30 tablet 11   metroNIDAZOLE (METROGEL) 1 % gel Apply topically as needed.     Multiple Vitamins-Minerals (MULTIVITAMIN WITH MINERALS) tablet Take 1 tablet by mouth daily.     oxymetazoline (AFRIN) 0.05 % nasal spray Place 1-2 sprays into both nostrils 2  (two) times daily as needed for congestion.     potassium chloride (KLOR-CON) 10 MEQ tablet Take 8 tablets (80 mEq total) by mouth 2 (two) times daily. 480 tablet 11   promethazine (PHENERGAN) 25 MG tablet Take 1 tablet (25 mg total) by mouth every 6 (six) to 8 (eight) hours for nausea or vomiting. 90 tablet 2   spironolactone (ALDACTONE) 25 MG tablet Take 1 tablet (25 mg total) by mouth daily. 45 tablet 3   zolpidem (AMBIEN) 10 MG tablet Take 5-10 mg by mouth at bedtime.     cephALEXin (KEFLEX) 500 MG capsule Take 1 capsule (500 mg total) by mouth 3 (three) times daily. (Patient not taking: Reported on 07/04/2022) 20 capsule 0   No current facility-administered medications for this visit.    Physical Exam:     BP 97/60   Pulse 91   Ht 5\' 10"  (1.778 m)   Wt 170 lb (77.1 kg)   BMI 24.39 kg/m  GENERAL:  Pleasant female in NAD PSYCH: : Cooperative, normal affect NEURO: Alert and oriented x 3, no focal neurologic deficits   IMPRESSION and PLAN:    1)   ***  ESR repeat. Will call lab and see if it can be added to lab from earlier [place] - Referral to Rheumatology  Bensimhon next week RTC in 3 months or sooner prn 2nd opinion? IBD conference?            [place] ,DO, FACG 07/04/2022, 3:01 PM

## 2022-07-05 ENCOUNTER — Other Ambulatory Visit (HOSPITAL_COMMUNITY): Payer: Self-pay

## 2022-07-05 ENCOUNTER — Other Ambulatory Visit (HOSPITAL_COMMUNITY): Payer: Self-pay | Admitting: Internal Medicine

## 2022-07-05 MED ORDER — METOPROLOL SUCCINATE ER 25 MG PO TB24
25.0000 mg | ORAL_TABLET | Freq: Every day | ORAL | 11 refills | Status: DC
  Filled 2022-07-05: qty 30, 30d supply, fill #0
  Filled 2022-08-02: qty 30, 30d supply, fill #1
  Filled 2022-09-01: qty 30, 30d supply, fill #2
  Filled 2022-10-01: qty 30, 30d supply, fill #3
  Filled 2022-10-30: qty 30, 30d supply, fill #4
  Filled 2022-11-30: qty 30, 30d supply, fill #5

## 2022-07-09 ENCOUNTER — Other Ambulatory Visit (HOSPITAL_COMMUNITY): Payer: Self-pay

## 2022-07-09 ENCOUNTER — Encounter: Payer: Self-pay | Admitting: Gastroenterology

## 2022-07-09 ENCOUNTER — Telehealth (HOSPITAL_COMMUNITY): Payer: Self-pay

## 2022-07-09 DIAGNOSIS — K50119 Crohn's disease of large intestine with unspecified complications: Secondary | ICD-10-CM

## 2022-07-09 MED ORDER — BUDESONIDE 3 MG PO CPEP
ORAL_CAPSULE | ORAL | 0 refills | Status: DC
  Filled 2022-07-09: qty 60, 30d supply, fill #0

## 2022-07-09 MED ORDER — POTASSIUM CHLORIDE ER 10 MEQ PO TBCR
100.0000 meq | EXTENDED_RELEASE_TABLET | Freq: Two times a day (BID) | ORAL | 2 refills | Status: DC
  Filled 2022-07-09: qty 600, 30d supply, fill #0
  Filled 2022-08-02: qty 600, 30d supply, fill #1
  Filled 2022-09-01: qty 600, 30d supply, fill #2

## 2022-07-09 NOTE — Telephone Encounter (Signed)
Patient advised and verbalized understanding. Patient has an appointment Wednesday in our office. Med list updated to reflect changes.   Meds ordered this encounter  Medications   potassium chloride (KLOR-CON) 10 MEQ tablet    Sig: Take 10 tablets (100 mEq total) by mouth 2 (two) times daily.    Dispense:  600 tablet    Refill:  2    Please cancel all previous orders for current medication. Change in dosage or pill size.

## 2022-07-09 NOTE — Telephone Encounter (Signed)
Patient wanted to let you know that the diarrhea has returned. She also let her GI MD know.

## 2022-07-09 NOTE — Telephone Encounter (Signed)
-----   Message from Jolaine Artist, MD sent at 07/05/2022  3:19 PM EDT ----- Increase potassium by 75meq daily  Repeat BMET in 1 week  ----- Message ----- From: Shonna Chock, CMA Sent: [id number]   2:12 PM EDT To: Jolaine Artist, MD  Patient confirms that she has been taking K and has not missed any doses.

## 2022-07-10 ENCOUNTER — Other Ambulatory Visit (HOSPITAL_COMMUNITY): Payer: Self-pay

## 2022-07-10 ENCOUNTER — Other Ambulatory Visit: Payer: Self-pay

## 2022-07-10 ENCOUNTER — Other Ambulatory Visit: Payer: Medicaid Other

## 2022-07-10 DIAGNOSIS — K50119 Crohn's disease of large intestine with unspecified complications: Secondary | ICD-10-CM | POA: Diagnosis not present

## 2022-07-11 ENCOUNTER — Encounter (HOSPITAL_COMMUNITY): Payer: Self-pay | Admitting: Internal Medicine

## 2022-07-11 ENCOUNTER — Ambulatory Visit (HOSPITAL_COMMUNITY)
Admission: RE | Admit: 2022-07-11 | Discharge: 2022-07-11 | Disposition: A | Discharge status: Home / Self Care | Payer: Medicaid Other | Source: Ambulatory Visit | Attending: Internal Medicine | Admitting: Internal Medicine

## 2022-07-11 VITALS — BP 94/68 | HR 71 | Wt 178.2 lb

## 2022-07-11 DIAGNOSIS — I2510 Atherosclerotic heart disease of native coronary artery without angina pectoris: Secondary | ICD-10-CM | POA: Insufficient documentation

## 2022-07-11 DIAGNOSIS — D751 Secondary polycythemia: Secondary | ICD-10-CM | POA: Diagnosis not present

## 2022-07-11 DIAGNOSIS — I513 Intracardiac thrombosis, not elsewhere classified: Secondary | ICD-10-CM | POA: Insufficient documentation

## 2022-07-11 DIAGNOSIS — Z79899 Other long term (current) drug therapy: Secondary | ICD-10-CM | POA: Insufficient documentation

## 2022-07-11 DIAGNOSIS — I255 Ischemic cardiomyopathy: Secondary | ICD-10-CM | POA: Diagnosis not present

## 2022-07-11 DIAGNOSIS — F32A Depression, unspecified: Secondary | ICD-10-CM | POA: Insufficient documentation

## 2022-07-11 DIAGNOSIS — E876 Hypokalemia: Secondary | ICD-10-CM | POA: Diagnosis not present

## 2022-07-11 DIAGNOSIS — R002 Palpitations: Secondary | ICD-10-CM | POA: Diagnosis not present

## 2022-07-11 DIAGNOSIS — R197 Diarrhea, unspecified: Secondary | ICD-10-CM | POA: Diagnosis not present

## 2022-07-11 DIAGNOSIS — I714 Abdominal aortic aneurysm, without rupture, unspecified: Secondary | ICD-10-CM | POA: Insufficient documentation

## 2022-07-11 DIAGNOSIS — Z95810 Presence of automatic (implantable) cardiac defibrillator: Secondary | ICD-10-CM | POA: Diagnosis not present

## 2022-07-11 DIAGNOSIS — F419 Anxiety disorder, unspecified: Secondary | ICD-10-CM | POA: Diagnosis not present

## 2022-07-11 DIAGNOSIS — I252 Old myocardial infarction: Secondary | ICD-10-CM | POA: Insufficient documentation

## 2022-07-11 DIAGNOSIS — I5022 Chronic systolic (congestive) heart failure: Secondary | ICD-10-CM | POA: Insufficient documentation

## 2022-07-11 DIAGNOSIS — Z87891 Personal history of nicotine dependence: Secondary | ICD-10-CM | POA: Diagnosis not present

## 2022-07-11 DIAGNOSIS — F909 Attention-deficit hyperactivity disorder, unspecified type: Secondary | ICD-10-CM | POA: Diagnosis not present

## 2022-07-11 DIAGNOSIS — Z7901 Long term (current) use of anticoagulants: Secondary | ICD-10-CM | POA: Diagnosis not present

## 2022-07-11 DIAGNOSIS — G4733 Obstructive sleep apnea (adult) (pediatric): Secondary | ICD-10-CM | POA: Insufficient documentation

## 2022-07-11 LAB — BASIC METABOLIC PANEL
Anion gap: 11 (ref 5–15)
BUN: 17 mg/dL (ref 6–20)
CO2: 27 mmol/L (ref 22–32)
Calcium: 9.7 mg/dL (ref 8.9–10.3)
Chloride: 98 mmol/L (ref 98–111)
Creatinine, Ser: 1.03 mg/dL — ABNORMAL HIGH (ref 0.44–1.00)
GFR, Estimated: >60 mL/min (ref >60)
Glucose, Bld: 111 mg/dL — ABNORMAL HIGH (ref 70–99)
Potassium: 5.2 mmol/L — ABNORMAL HIGH (ref 3.5–5.1)
Sodium: 136 mmol/L (ref 135–145)

## 2022-07-11 LAB — BRAIN NATRIURETIC PEPTIDE: B Natriuretic Peptide: 69 pg/mL (ref 0.0–100.0)

## 2022-07-11 NOTE — Patient Instructions (Signed)
There has been no changes to your medications.  Labs done today, your results will be available in MyChart, we will contact you for abnormal readings.  Your physician has requested that you have an echocardiogram. Echocardiography is a painless test that uses sound waves to create images of your heart. It provides your doctor with information about the size and shape of your heart and how well your heart's chambers and valves are working. This procedure takes approximately one hour. There are no restrictions for this procedure. Please do NOT wear cologne, perfume, aftershave, or lotions (deodorant is allowed). Please arrive 15 minutes prior to your appointment time.  Your physician recommends that you schedule a follow-up appointment in: 4 months  If you have any questions or concerns before your next appointment please send us a message through mychart or call our office at [phone].    TO LEAVE A MESSAGE FOR THE NURSE SELECT OPTION 2, PLEASE LEAVE A MESSAGE INCLUDING: YOUR NAME DATE OF BIRTH CALL BACK NUMBER REASON FOR CALL**this is important as we prioritize the call backs  YOU WILL RECEIVE A CALL BACK THE SAME DAY AS LONG AS YOU CALL BEFORE 4:00 PM  At the [facility], you and your health needs are our priority. As part of our continuing mission to provide you with exceptional heart care, we have created designated Provider Care Teams. These Care Teams include your primary Cardiologist (physician) and Advanced Practice Providers (APPs- Physician Assistants and Nurse Practitioners) who all work together to provide you with the care you need, when you need it.   You may see any of the following providers on your designated Care Team at your next follow up: Dr Daniel Bensimhon Dr Dalton McLean Dr. Aditya Sabharwal Amy Clegg, NP [name] Simmons, PA Jessica Milford,NP Lindsay Finch, PA Alma Diaz, NP Lauren Kemp, PharmD   Please be sure to bring in all your  medications bottles to every appointment.    Thank you for choosing  [facility]    

## 2022-07-11 NOTE — Addendum Note (Signed)
Encounter addended by: Jerl Mina, RN on: 07/11/2022 12:11 PM  Actions taken: Order list changed, Diagnosis association updated, Clinical Note Signed, Charge Capture section accepted

## 2022-07-11 NOTE — Progress Notes (Signed)
[facility] Note   PCP: [facility]: Dr Oval Linsey HF Cardiologist: Dr Haroldine Laws  HPI: Natajah Rybinski is a 57 y.o.female with history of chronic tobacco use, ADHD, anxiety, depression, CAD, systolic HF   Presented to ED 01/20/21 with increased shortness of breath/tachycardia. Adderrall stopped. Echo with EF 20-25%,  LHC/RHC with single vessel CAD (occluded mLAD) elevated filling pressures and  moderately reduced CO. Digoxin added. Beta blocker stopped.  Discharged to home 01/25/21. Discharge weight 213 pounds.   Echo 05/11/21 EF < 20% LV severely dilated RV ok Mild MR. Referred for ICD.  CPX with very mild HF limitation with elevated Ve/VCO2 slope  S/p [employer] ICD 4/23.  Saw PCP 08/10/21 for dizziness and anxiety. Felt insomnia related, labs unremarkable.  Seen in ED 08/18/21 with sudden onset SOB. CXR and EKG re-assuring, Trop x 2 normal. BNP mildly up, advised admission to further work up. She declined, as symptoms completely resolved, and was elected to discharge home.  Seen in ED 10/06/21 for urinary retention, required foley. Felt to be 2/2 to prazosin and this was stopped.  Admitted(03/01/22) with CP and chronic diarrhea. Hstrop negative. Felt to have Crohn's disease. Losartan and spiro stopped due to low BP.  Following with GI (Dr. Clarisa Fling)  Multiple ED visits for various issues.   Here for f/u. Says she feels "crappy". Wakes up every morning and feels that her heart is racing. Denies CP or SOB. Still having diarrhea when she stops Entercort. Pending CT. Walking with her fiancee every night at [place] for 20-30 mins. Has to take breaks. No edema, orthopnea or PND.   Monitor in 12/23: Rare PVCs 2 episodes of brief SVT (4 beats)   ICD interrogated. No VT/AF. Volume ok. HL score 0. Personally reviewed  Cardiac Testing  - Echo 05/11/21 EF < 20% LV severely dilated RV ok Mild MR   - Echo (10/22): EF 20-25% LV severely dilated, LV  apical mural thrombus, RV okay, moderate MR.  - R/LHC (10/22): w/ severe 1v CAD with occlusion of mid LAD., PCW 27, CO 4.7, CI 2.2    - cMRI (10/22): demonstrated subendocardial LGE consistent with prior infarcts in LV basal inferolateral wall, apical anterior/septal/inferior walls and apex. LVEF 22%.  - CPX 3/23: FVC 3.60 (85%)      FEV1 2.89 (87%)        FEV1/FVC 80 (100%)        MVV 171 (157%)   Resting HR: 113 Standing HR: 113 Peak HR: 149   (89 % age predicted max HR)  BP rest: 98/60 Standing BP: 90/62 BP peak: 134/58  Peak VO2: 18.8 (90% predicted peak VO2)  VE/VCO2 slope:  33 Peak RER: 1.14  VE/MVV:  38% O2pulse:  11   (100 % predicted O2pulse)   ROS: All systems negative except as listed in HPI, PMH and Problem List.  SH:  Social History   Socioeconomic History   Marital status: Single    Spouse name: Not on file   Number of children: 0   Years of education: 16   Highest education level: Not on file  Occupational History   Not on file  Tobacco Use   Smoking status: Former    Packs/day: .5    Types: Cigarettes    Quit date: 01/15/2021    Years since quitting: 1.4   Smokeless tobacco: Never  Vaping Use   Vaping Use: Never used  Substance and Sexual Activity   Alcohol use: Yes  Comment: rarely   Drug use: Never   Sexual activity: Not on file  Other Topics Concern   Not on file  Social History Narrative   Not on file   Social Determinants of Health   Financial Resource Strain: Medium Risk (01/31/2021)   Overall Financial Resource Strain (CARDIA)    Difficulty of Paying Living Expenses: Somewhat hard  Food Insecurity: No Food Insecurity (01/31/2021)   Hunger Vital Sign    Worried About Running Out of Food in the Last Year: Never true    Ran Out of Food in the Last Year: Never true  Transportation Needs: No Transportation Needs (01/31/2021)   PRAPARE - Hydrologist (Medical): No    Lack of Transportation (Non-Medical): No   Physical Activity: Not on file  Stress: Not on file  Social Connections: Not on file  Intimate Partner Violence: Not on file   FH:  Family History  Problem Relation Age of Onset   Stroke Mother    Atrial fibrillation Mother    Heart failure Father    Stomach cancer Maternal Grandfather    Liver cancer Neg Hx    Esophageal cancer Neg Hx    Colon polyps Neg Hx    Colon cancer Neg Hx    Past Medical History:  Diagnosis Date   Allergy    Anxiety 01/20/2021   CAD in native artery 08/24/2021   CHF (congestive heart failure) (HCC)    Chronic combined systolic and diastolic heart failure ([place]) [id number]   Complication of anesthesia    Depression    Hyperlipidemia    Myocardial infarction ([place])    Presence of cardiac defibrillator 07/2021   Shortness of breath 01/20/2021   Sleep apnea 07/2021   Tobacco abuse 01/20/2021   Current Outpatient Medications  Medication Sig Dispense Refill   acetaminophen (TYLENOL) 500 MG tablet Take 1,000 mg by mouth 3 (three) times daily as needed for moderate pain or headache.     ALPRAZolam (XANAX) 1 MG tablet Take 2 mg by mouth 3 (three) times daily.     alum & mag hydroxide-simeth (MAALOX/MYLANTA) 200-200-20 MG/5ML suspension Take 30 mLs by mouth every 4 (four) hours as needed for indigestion or heartburn. 355 mL 0   apixaban (ELIQUIS) 5 MG TABS tablet Take 1 tablet (5 mg total) by mouth 2 (two) times daily. 60 tablet 4   atorvastatin (LIPITOR) 80 MG tablet Take 1 tablet (80 mg total) by mouth daily. 90 tablet 3   budesonide (ENTOCORT EC) 3 MG 24 hr capsule Take 2 capsules (6 mg total) by mouth daily for 60 days, THEN 1 capsule (3 mg total) daily. 120 capsule 0   buPROPion (WELLBUTRIN XL) 150 MG 24 hr tablet Take 150 mg by mouth daily.     cyclobenzaprine (FLEXERIL) 10 MG tablet Take 20 mg by mouth at bedtime.     dapagliflozin propanediol (FARXIGA) 10 MG TABS tablet Take 1 tablet (10 mg total) by mouth daily. 30 tablet 11   digoxin (LANOXIN)  0.125 MG tablet Take 1 tablet (0.125 mg total) by mouth daily. 30 tablet 3   diphenhydrAMINE (BENADRYL) 25 MG tablet Take 50 mg by mouth as needed for allergies.     diphenoxylate-atropine (LOMOTIL) 2.5-0.025 MG tablet Take 2 tablets by mouth 4 (four) times daily as needed for diarrhea or loose stools. 56 tablet 0   feeding supplement (ENSURE ENLIVE / ENSURE PLUS) LIQD Take 237 mLs by mouth 2 (two) times daily between meals.  237 mL 12   furosemide (LASIX) 40 MG tablet Take 2 tablets (80 mg total) by mouth in the morning and every evening 120 tablet 3   ivabradine (CORLANOR) 5 MG TABS tablet Take 1 tablet (5 mg total) by mouth 2 (two) times daily. 60 tablet 6   lipase/protease/amylase (CREON) 36000 UNITS CPEP capsule Take 2 capsules ( 72000 units) with meals and 1 capsule ( 36000 units) with snack. (Patient taking differently: 36,000 Units. Take 1 tablet before breakfast) 300 capsule 5   metolazone (ZAROXOLYN) 2.5 MG tablet Take 1 tablet (2.5 mg total) by mouth daily. 3 tablet 0   metoprolol succinate (TOPROL-XL) 25 MG 24 hr tablet Take 1 tablet (25 mg total) by mouth daily. Take with or immediately following a meal. 30 tablet 11   metroNIDAZOLE (METROGEL) 1 % gel Apply topically as needed.     Multiple Vitamins-Minerals (MULTIVITAMIN WITH MINERALS) tablet Take 1 tablet by mouth daily.     oxymetazoline (AFRIN) 0.05 % nasal spray Place 1-2 sprays into both nostrils 2 (two) times daily as needed for congestion.     potassium chloride (KLOR-CON) 10 MEQ tablet Take 10 tablets (100 mEq total) by mouth 2 (two) times daily. 600 tablet 2   promethazine (PHENERGAN) 25 MG tablet Take 1 tablet (25 mg total) by mouth every 6 (six) to 8 (eight) hours for nausea or vomiting. 90 tablet 2   spironolactone (ALDACTONE) 25 MG tablet Take 1 tablet (25 mg total) by mouth daily. 45 tablet 3   zolpidem (AMBIEN) 10 MG tablet Take 5-10 mg by mouth at bedtime.     No current facility-administered medications for this  encounter.   BP 94/68   Pulse 71   Wt 80.8 kg (178 lb 3.2 oz)   SpO2 99%   BMI 25.57 kg/m   Wt Readings from Last 3 Encounters:  07/11/22 80.8 kg (178 lb 3.2 oz)  07/04/22 77.1 kg (170 lb)  05/30/22 81.6 kg (180 lb)   PHYSICAL EXAM: General:  Well appearing. No resp difficulty HEENT: normal Neck: supple. no JVD. Carotids 2+ bilat; no bruits. No lymphadenopathy or thryomegaly appreciated. Cor: PMI nondisplaced. Regular rate & rhythm. No rubs, gallops or murmurs. Lungs: clear Abdomen: soft, nontender, nondistended. No hepatosplenomegaly. No bruits or masses. Good bowel sounds. Extremities: no cyanosis, clubbing, rash, edema Neuro: alert & orientedx3, cranial nerves grossly intact. moves all 4 extremities w/o difficulty. Affect pleasant  ICD interrogation (personally reviewed): HL Score 11, Fluid trending up. No VT. Activity level 1.6 hr Personally reviewed   ASSESSMENT & PLAN: 1. 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 Echo EF 20-25%, severely dilated LV, RV okay, moderate MR - R/LHC (10/22): 1v CAD occluded mid LAD PCWP 27,  Fick 4.7/2.  - cMRI 10/22 EF 22% subendocardial LGE consistent with prior infarcts in LV basal inferolateral wall, apical anterior/septal/inferior walls and apex. No viability. RV okay. Not sure how to explain inferior defects on cMRI  - Echo (05/11/21) EF < 20% RV ok - CPX 1/23 with very mild HF limitation:  Peak VO2: 18.8 (90% predicted peak VO2) VE/VCO2 slope: 33 Peak RER: 1.14  - s/p [employer] ICD 4/23. - ICD interrogated. No VT/AF. Volume ok. HL score 0. Personally reviewed - Reports NYHA III symptoms but objective findings are stable. That said EF remains very low on most recent echo.  - Continue Ivabradine 5 mg bid.  - Continue Toprol XL 25 mg daily. - Continue spiro 12.5  -  Off losartan due to low BP. BP too low to re-challenge - Continue digoxin 0.125 mg daily. Last dig level 0.5 (09/20/21) - Continue Farxiga 10 mg daily.  - Will  need repeat echo. If EF still < 20% Will need repeat CPX   2. Palpitations - No longer on Adderall. - Zio 14-day (11/22) showed mostly SR, no high-grade arrhythmias. - Monitor 12/23 SR. Two brief runs SVT (4 beats)   3. CAD - Single vessel LAD occlusion on LHC. - No s/s ischemia - Continue statin. - No ASA with Eliquis.   4. Tobacco use - Remains quit from cigarettes.  5. LV apical thrombus - Noted on echo/cMRI.  - On Eliquis. No bleeding issues.   6. Diarrhea  - Following with GI. Felt to have Crohn's disease  7. OSA - Mild on sleep study, AHI 11.8 - Follows with Dr. Radford Pax, referred to sleep dentistry to discuss oral device.  8. Polycythemia - Follows with Heme/Onc. - JAK negative  9. AAA - CT scan 11/23  3.1cm - f/u u/s in 3 years recommended  9. Hypokalemia - recent K 3.2. K increased to 10 bid   Glori Bickers MD 11:53 AM

## 2022-07-12 ENCOUNTER — Encounter (HOSPITAL_COMMUNITY): Payer: Self-pay

## 2022-07-12 LAB — QUANTIFERON-TB GOLD PLUS
Mitogen-NIL: 7.46 IU/mL
NIL: 0.03 IU/mL
QuantiFERON-TB Gold Plus: NEGATIVE
TB1-NIL: 0 IU/mL
TB2-NIL: <0 IU/mL

## 2022-07-13 ENCOUNTER — Other Ambulatory Visit (HOSPITAL_COMMUNITY): Payer: Self-pay

## 2022-07-13 DIAGNOSIS — I5022 Chronic systolic (congestive) heart failure: Secondary | ICD-10-CM

## 2022-07-14 ENCOUNTER — Encounter: Payer: Self-pay | Admitting: Gastroenterology

## 2022-07-16 DIAGNOSIS — G4733 Obstructive sleep apnea (adult) (pediatric): Secondary | ICD-10-CM | POA: Diagnosis not present

## 2022-07-16 DIAGNOSIS — Z419 Encounter for procedure for purposes other than remedying health state, unspecified: Secondary | ICD-10-CM | POA: Diagnosis not present

## 2022-07-17 ENCOUNTER — Other Ambulatory Visit (HOSPITAL_COMMUNITY): Payer: Self-pay

## 2022-07-17 ENCOUNTER — Ambulatory Visit (HOSPITAL_COMMUNITY)
Admission: RE | Admit: 2022-07-17 | Discharge: 2022-07-17 | Disposition: A | Discharge status: Home / Self Care | Payer: Medicaid Other | Source: Ambulatory Visit | Attending: Cardiology | Admitting: Cardiology

## 2022-07-17 ENCOUNTER — Other Ambulatory Visit (HOSPITAL_COMMUNITY): Payer: Self-pay | Admitting: Family Medicine

## 2022-07-17 ENCOUNTER — Other Ambulatory Visit: Payer: Self-pay

## 2022-07-17 DIAGNOSIS — I5022 Chronic systolic (congestive) heart failure: Secondary | ICD-10-CM | POA: Diagnosis not present

## 2022-07-17 DIAGNOSIS — K5730 Diverticulosis of large intestine without perforation or abscess without bleeding: Secondary | ICD-10-CM | POA: Diagnosis not present

## 2022-07-17 DIAGNOSIS — R197 Diarrhea, unspecified: Secondary | ICD-10-CM | POA: Insufficient documentation

## 2022-07-17 DIAGNOSIS — I7143 Infrarenal abdominal aortic aneurysm, without rupture: Secondary | ICD-10-CM | POA: Diagnosis not present

## 2022-07-17 DIAGNOSIS — Z9049 Acquired absence of other specified parts of digestive tract: Secondary | ICD-10-CM | POA: Diagnosis not present

## 2022-07-17 DIAGNOSIS — R634 Abnormal weight loss: Secondary | ICD-10-CM | POA: Insufficient documentation

## 2022-07-17 DIAGNOSIS — R110 Nausea: Secondary | ICD-10-CM | POA: Insufficient documentation

## 2022-07-17 LAB — BASIC METABOLIC PANEL
Anion gap: 10 (ref 5–15)
BUN: 14 mg/dL (ref 6–20)
CO2: 27 mmol/L (ref 22–32)
Calcium: 9.3 mg/dL (ref 8.9–10.3)
Chloride: 100 mmol/L (ref 98–111)
Creatinine, Ser: 0.96 mg/dL (ref 0.44–1.00)
GFR, Estimated: >60 mL/min (ref >60)
Glucose, Bld: 105 mg/dL — ABNORMAL HIGH (ref 70–99)
Potassium: 3.4 mmol/L — ABNORMAL LOW (ref 3.5–5.1)
Sodium: 137 mmol/L (ref 135–145)

## 2022-07-18 ENCOUNTER — Ambulatory Visit (HOSPITAL_COMMUNITY)
Admission: RE | Admit: 2022-07-18 | Discharge: 2022-07-18 | Disposition: A | Discharge status: Home / Self Care | Payer: Medicaid Other | Source: Ambulatory Visit | Attending: Gastroenterology | Admitting: Gastroenterology

## 2022-07-18 ENCOUNTER — Ambulatory Visit (INDEPENDENT_AMBULATORY_CARE_PROVIDER_SITE_OTHER): Payer: Medicaid Other

## 2022-07-18 DIAGNOSIS — R197 Diarrhea, unspecified: Secondary | ICD-10-CM

## 2022-07-18 DIAGNOSIS — Z9049 Acquired absence of other specified parts of digestive tract: Secondary | ICD-10-CM | POA: Diagnosis not present

## 2022-07-18 DIAGNOSIS — K5730 Diverticulosis of large intestine without perforation or abscess without bleeding: Secondary | ICD-10-CM | POA: Diagnosis not present

## 2022-07-18 DIAGNOSIS — R634 Abnormal weight loss: Secondary | ICD-10-CM | POA: Diagnosis not present

## 2022-07-18 DIAGNOSIS — I425 Other restrictive cardiomyopathy: Secondary | ICD-10-CM | POA: Diagnosis not present

## 2022-07-18 DIAGNOSIS — R110 Nausea: Secondary | ICD-10-CM

## 2022-07-18 DIAGNOSIS — I7143 Infrarenal abdominal aortic aneurysm, without rupture: Secondary | ICD-10-CM | POA: Diagnosis not present

## 2022-07-18 DIAGNOSIS — I5022 Chronic systolic (congestive) heart failure: Secondary | ICD-10-CM | POA: Diagnosis not present

## 2022-07-18 LAB — CUP PACEART REMOTE DEVICE CHECK
Battery Remaining Longevity: 162 mo
Battery Remaining Percentage: 100 %
Brady Statistic RV Percent Paced: 0 %
Date Time Interrogation Session: 20240403065900
HighPow Impedance: 78 Ohm
Implantable Lead Connection Status: 753985
Implantable Lead Implant Date: 20230407
Implantable Lead Location: 753860
Implantable Lead Model: 138
Implantable Lead Serial Number: 303166
Implantable Pulse Generator Implant Date: 20230407
Lead Channel Impedance Value: 832 Ohm
Lead Channel Pacing Threshold Amplitude: 0.6 V
Lead Channel Pacing Threshold Pulse Width: 0.4 ms
Lead Channel Setting Pacing Amplitude: 2.5 V
Lead Channel Setting Pacing Pulse Width: 0.4 ms
Lead Channel Setting Sensing Sensitivity: 0.5 mV
Pulse Gen Serial Number: 216478
Zone Setting Status: 755011

## 2022-07-18 MED ORDER — BARIUM SULFATE 0.1 % PO SUSP
ORAL | Status: AC
  Filled 2022-07-18: qty 3

## 2022-07-18 MED ORDER — IOHEXOL 300 MG/ML  SOLN
100.0000 mL | Freq: Once | INTRAMUSCULAR | Status: AC | PRN
  Administered 2022-07-18: 100 mL via INTRAVENOUS

## 2022-07-18 NOTE — Progress Notes (Signed)
07/20/2022 Christine JulyMary [name] [id number] 12-13-1965  Referring provider: Marcine MatarJohnson, Deborah B, MD Primary GI doctor: Dr. Barron Alvineirigliano  ASSESSMENT AND PLAN:   Crohn's disease with ileitis and stricture Patient has been on Entocort, had repeat CT enterography that showed continuing inflammation and strictures at TI with mild small bowel obstruction Soft abdomen on exam today, has had diarrhea and nausea without vomiting.   Had to do liquid diet last week. Stricture may be more chronic or this may be more of an acute flare, I think Entocort is not controlling patient's stricturing Crohn's ileitis Will get labs to evaluate for infection with C. difficile and GI stool profile Will get labs to evaluate for infection hs-CRP, sed rate, fecal calprotectin Will get 2 view abdomen, ER precautions discussed with patient about small bowel obstruction and when she would need to head to the ER.  Patient very high risk for surgical procedures with other comorbid conditions, I think it is in her best interest to escalate her Crohn's therapy.   Patient has had negative hep B and TB Gold.  Pending TPMT Due to her heart failure uncertain if she is a candidate for anti-TNF's, consider entyvio/stelera/skyrizi. She is up-to-date on her vaccinations Will stop Entocort and put patient on prednisone taper while we figure out which biologic insurance will cover and which would be the best fit for the patient, will send message to Dr. Carloyn Mannerirgliano. Discussed looking up IBD and me and given resources for Crohn's ileitis  Skin rash Appears to be more pruritic rather than tender or painful, they are raised flat lesions on bilateral shins, slight improvement on the Entocort, possible complications from Crohn's but also could be more allergic.  Informed patient do hydrocortisone and certizine. Prednisone should help if it is allergic versus extraintestinal manifestation from Crohn's  Exocrine pancreatic  insufficiency Continue Creon though I do still wonder if this was a false positive as well.  Chronic combined systolic and diastolic heart failure Getting repeat echocardiogram last echo was still less than 20%, status post AICD  CAD and history of LV thrombus Patient on Eliquis   Patient Care Team: Marcine MatarJohnson, Deborah B, MD as PCP - General (Internal Medicine) Chilton Siandolph, Tiffany, MD as PCP - Cardiology (Cardiology) Quintella Reicherturner, Traci R, MD as PCP - Sleep Medicine (Cardiology) Duke SalviaKlein, Steven C, MD as PCP - Electrophysiology (Cardiology)  HISTORY OF PRESENT ILLNESS: 57 y.o. female presents for evaluation of abnormal skin manifestations in setting of Crohn's ileitis. Past medical history consistent with HFrEF (LVEF <20%) and CAD c/b LAD occlusion s/p ICD, chronic tobacco use,OSA on CPAP, history of LV thrombus on eliquis, and ADHD, anxiety/depression, and PTSD. Last seen in the office on 07/04/2022 with Dr. Barron Alvineirigliano for Crohn's ileitis and EPI.   IBD history (prior medications titers why stopped and current meds with last dose): Diagnosed 02/26/2022 after colonoscopy for diarrhea that started 08/2021. CTE showed ileitis 2 segments of luminal narrowing/stricturing in distal ileum. 03/02/2022 Entocort 9 mg daily for 8 weeks, negative fecal calprotectin, normal CRP, sed rate 52. 03/02/2022 fecal pancreatic elastase less than 50, started on Creon Hypokalemia on potassium supplement Entocort monotherapy weaned off, diarrhea improved but continuing lower abdominal discomfort, nausea.  Last colonoscopy:  02/26/2022: Colonoscopy: 3 mm sigmoid HP, sigmoid diverticulosis, otherwise normal colon (path benign).  Terminal ileum with moderate stenosis measuring 1 cm in length by 5 mm in diameter which was not traversable.  This was located 3 cm from the ileocecal valve.  Terminal ileum contained a few ulcers  proximal and distal to stricture (path: Acute ileitis with ulceration)  Last small bowel imaging:    03/02/2022: CT enterography: Ileitis throughout a 15 cm segment of ileum with 2 segments of luminal narrowing/stricturing through this segment of distal ileum, c/w stricturing, ileal Crohn's Disease  Surgical history: no surgery.  Other significant medical history: Hypokalemia  Current History 07/04/2022 office visit with Dr. Barron Alvine and weaned off Entocort, diarrhea had resolved however she continued to have abdominal pain, nausea, night sweats, chills.    Considered referral to rheumatology and restarted back on entocort 03/25.  Rash on shin and back about 2 weeks ago, has improved while being on the entocort.  Raised pruritus macula/papules on legs, non tender, some on back.  No ulcers in mouth at this time but had a week ago.  No joint pain, some worsening of her vision and sensitivity to lights.  Last time saw eye doctor was years ago.   07/18/2022  CT enterography showed continuing mucosal hyper enhancement, 2 short segment strictures in TI, mild distal SBD partial obstruction, no penetrating disease, fistulae, or abscess.   She has AB bloating, has LLQ AB pain with BM.  She has constant nausea, she is on phenerghan 3-4 times a day, last week had a hard time eating, she had so much nausea she did not want any food, was drinking protein drinks.  Denies vomiting.  She has started back on solid foods, ate baked chicken and [place] toast.  She started a "flare" this past Monday with diarrhea 6-8 x a day, can wake her up and has not been able to sleep much due to diarrhea.  No melana, no hematochezia, no mucus in stool.  She has sweats feels no temperature regulation and then will get a chill, happens day and night, has awoken from sleep. Drenching sweats occ. No fever.  Cardiologist does not think it is related.  The patient has weight loss about a pound a week.   Recent labs: 03/13/2022 CRP <1.0  07/04/2022 SED RATE 44 Fecal cal <5 03/02/2022 03/16/2022 WBC 14.2 HGB 15.2 MCV 86.2  Platelets 411 09/20/2021 Iron 53 Ferritin 76  03/16/2022 AST 19 ALT 17 Alkphos 125 TBili 0.4  TB GOLD 07/10/2022 NEGATIVE or TB skin if indeterminate.  HepBsAG 07/10/2022 NON-REACTIVE  PENDING TPMT  IBD Health Care Maintenance: Annual Flu Vaccine - 2023 Pneumococcal Vaccine if receiving immunosuppression: -  2022 COVID vaccine HAS HAD 3 TOTAL, MAY GET NEW ONE Shingrix- had 01/26, going for second shot  Immunization History  Administered Date(s) Administered   Influenza,inj,Quad PF,6+ Mos 02/06/2021, 01/09/2022   PFIZER(Purple Top)SARS-COV-2 Vaccination 08/29/2019, 09/23/2019, 04/02/2020   PNEUMOCOCCAL CONJUGATE-20 02/06/2021   Tdap 09/05/2021   Zoster Recombinat (Shingrix) 05/11/2022      She  reports that she quit smoking about 18 months ago. Her smoking use included cigarettes. She smoked an average of .5 packs per day. She has never used smokeless tobacco. She reports current alcohol use. She reports that she does not use drugs.  RELEVANT LABS AND IMAGING: CBC    Component Value Date/Time   WBC 14.2 (H) 03/16/2022 1433   RBC 5.20 (H) 03/16/2022 1433   HGB 15.2 (H) 03/16/2022 1433   HGB 14.9 12/25/2021 1119   HGB 17.5 (H) 08/10/2021 1023   HCT 44.8 03/16/2022 1433   HCT 50.7 (H) 08/10/2021 1023   PLT 411 (H) 03/16/2022 1433   PLT 326 12/25/2021 1119   PLT 374 08/10/2021 1023   MCV 86.2 03/16/2022 1433  MCV 86 08/10/2021 1023   MCH 29.2 03/16/2022 1433   MCHC 33.9 03/16/2022 1433   RDW 13.9 03/16/2022 1433   RDW 13.1 08/10/2021 1023   LYMPHSABS 1.8 03/16/2022 1433   LYMPHSABS 1.8 08/10/2021 1023   MONOABS 0.7 03/16/2022 1433   EOSABS 0.1 03/16/2022 1433   EOSABS 0.3 08/10/2021 1023   BASOSABS 0.1 03/16/2022 1433   BASOSABS 0.1 08/10/2021 1023   Recent Labs    08/10/21 1023 08/18/21 1510 09/20/21 1504 11/28/21 1657 12/25/21 1119 03/01/22 0137 03/02/22 0339 03/16/22 1433  HGB 17.5* 16.8* 15.3* 15.3* 14.9 13.8 12.9 15.2*     CMP     Component Value  Date/Time   NA 137 07/17/2022 1145   NA 142 03/20/2022 1544   K 3.4 (L) 07/17/2022 1145   CL 100 07/17/2022 1145   CO2 27 07/17/2022 1145   GLUCOSE 105 (H) 07/17/2022 1145   BUN 14 07/17/2022 1145   BUN 17 03/20/2022 1544   CREATININE 0.96 07/17/2022 1145   CREATININE 0.90 12/25/2021 1119   CALCIUM 9.3 07/17/2022 1145   PROT 7.8 03/16/2022 1433   PROT 7.8 08/10/2021 1023   ALBUMIN 4.7 03/16/2022 1433   ALBUMIN 4.9 08/10/2021 1023   AST 19 03/16/2022 1433   AST 24 12/25/2021 1119   ALT 17 03/16/2022 1433   ALT 32 12/25/2021 1119   ALKPHOS 125 03/16/2022 1433   BILITOT 0.4 03/16/2022 1433   BILITOT 0.3 12/25/2021 1119   GFRNONAA >60 07/17/2022 1145   GFRNONAA >60 12/25/2021 1119   GFRAA  03/31/2008 1140    >60        The eGFR has been calculated using the MDRD equation. This calculation has not been validated in all clinical      Latest Ref Rng & Units 03/16/2022    2:33 PM 03/02/2022    3:39 AM 03/01/2022    1:37 AM  Hepatic Function  Total Protein 6.5 - 8.1 g/dL 7.8  6.0  7.2   Albumin 3.5 - 5.0 g/dL 4.7  3.3  3.9   AST 15 - 41 U/L 19  21  25    ALT 0 - 44 U/L 17  33  42   Alk Phosphatase 38 - 126 U/L 125  94  115   Total Bilirubin 0.3 - 1.2 mg/dL 0.4  0.5  0.5       Current Medications:   Current Outpatient Medications (Endocrine & Metabolic):    budesonide (ENTOCORT EC) 3 MG 24 hr capsule, Take 2 capsules (6 mg total) by mouth daily for 60 days, THEN 1 capsule (3 mg total) daily.   dapagliflozin propanediol (FARXIGA) 10 MG TABS tablet, Take 1 tablet (10 mg total) by mouth daily.   predniSONE (DELTASONE) 10 MG tablet, Take 4 tablets (40 mg total) by mouth daily with breakfast for 7 days, THEN 3 tablets (30 mg total) daily with breakfast for 14 days, THEN 2 tablets (20 mg total) daily with breakfast for 14 days, THEN 1 tablet (10 mg total) daily with breakfast for 7 days, THEN 0.5 tablets (5 mg total) daily with breakfast for 8 days.  Current Outpatient Medications  (Cardiovascular):    atorvastatin (LIPITOR) 80 MG tablet, Take 1 tablet (80 mg total) by mouth daily.   digoxin (LANOXIN) 0.125 MG tablet, Take 1 tablet (0.125 mg total) by mouth daily.   furosemide (LASIX) 40 MG tablet, Take 2 tablets (80 mg total) by mouth in the morning and every evening   ivabradine (CORLANOR) 5  MG TABS tablet, Take 1 tablet (5 mg total) by mouth 2 (two) times daily.   metolazone (ZAROXOLYN) 2.5 MG tablet, Take 1 tablet (2.5 mg total) by mouth daily.   metoprolol succinate (TOPROL-XL) 25 MG 24 hr tablet, Take 1 tablet (25 mg total) by mouth daily. Take with or immediately following a meal.   spironolactone (ALDACTONE) 25 MG tablet, Take 1 tablet (25 mg total) by mouth daily.  Current Outpatient Medications (Respiratory):    diphenhydrAMINE (BENADRYL) 25 MG tablet, Take 50 mg by mouth as needed for allergies.   oxymetazoline (AFRIN) 0.05 % nasal spray, Place 1-2 sprays into both nostrils 2 (two) times daily as needed for congestion.   promethazine (PHENERGAN) 25 MG tablet, Take 1 tablet (25 mg total) by mouth every 6 (six) to 8 (eight) hours for nausea or vomiting.  Current Outpatient Medications (Analgesics):    acetaminophen (TYLENOL) 500 MG tablet, Take 1,000 mg by mouth 3 (three) times daily as needed for moderate pain or headache.  Current Outpatient Medications (Hematological):    apixaban (ELIQUIS) 5 MG TABS tablet, Take 1 tablet (5 mg total) by mouth 2 (two) times daily.  Current Outpatient Medications (Other):    ALPRAZolam (XANAX) 1 MG tablet, Take 2 mg by mouth 3 (three) times daily.   alum & mag hydroxide-simeth (MAALOX/MYLANTA) 200-200-20 MG/5ML suspension, Take 30 mLs by mouth every 4 (four) hours as needed for indigestion or heartburn.   buPROPion (WELLBUTRIN XL) 150 MG 24 hr tablet, Take 150 mg by mouth daily.   cyclobenzaprine (FLEXERIL) 10 MG tablet, Take 20 mg by mouth at bedtime.   diphenoxylate-atropine (LOMOTIL) 2.5-0.025 MG tablet, Take 2 tablets by  mouth 4 (four) times daily as needed for diarrhea or loose stools.   feeding supplement (ENSURE ENLIVE / ENSURE PLUS) LIQD, Take 237 mLs by mouth 2 (two) times daily between meals.   lipase/protease/amylase (CREON) 36000 UNITS CPEP capsule, Take 2 capsules ( 72000 units) with meals and 1 capsule ( 36000 units) with snack. (Patient taking differently: 36,000 Units. Take 1 tablet before breakfast)   metroNIDAZOLE (METROGEL) 1 % gel, Apply topically as needed.   Multiple Vitamins-Minerals (MULTIVITAMIN WITH MINERALS) tablet, Take 1 tablet by mouth daily.   potassium chloride (KLOR-CON) 10 MEQ tablet, Take 10 tablets (100 mEq total) by mouth 2 (two) times daily.   zolpidem (AMBIEN) 10 MG tablet, Take 5-10 mg by mouth at bedtime.  Medical History:  Past Medical History:  Diagnosis Date   Allergy    Anxiety 01/20/2021   CAD in native artery 08/24/2021   CHF (congestive heart failure)    Chronic combined systolic and diastolic heart failure 08/24/2021   Complication of anesthesia    Depression    Hyperlipidemia    Myocardial infarction    Presence of cardiac defibrillator 07/2021   Shortness of breath 01/20/2021   Sleep apnea 07/2021   Tobacco abuse 01/20/2021   Allergies:  Allergies  Allergen Reactions   Sulfa Antibiotics Hives, Itching and Rash   Erythromycin Hives   Tramadol Rash and Other (See Comments)    Urinary retention     Ibuprofen Itching   Tape Rash     Surgical History:  She  has a past surgical history that includes Abdominal hysterectomy; Neck surgery; Ovarian cyst surgery; Cholecystectomy; RIGHT/LEFT HEART CATH AND CORONARY ANGIOGRAPHY (N/A, 01/23/2021); Cardiac catheterization; ICD IMPLANT (N/A, 07/21/2021); Esophagogastroduodenoscopy (egd) with propofol (N/A, 02/26/2022); Colonoscopy with propofol (N/A, 02/26/2022); biopsy (02/26/2022); and polypectomy (02/26/2022). Family History:  Her family history  includes Atrial fibrillation in her mother; Heart failure in her  father; Stomach cancer in her maternal grandfather; Stroke in her mother.  REVIEW OF SYSTEMS  : All other systems reviewed and negative except where noted in the History of Present Illness.  PHYSICAL EXAM: BP 90/68   Pulse 77   Ht 5\' 10"  (1.778 m)   Wt 178 lb (80.7 kg)   BMI 25.54 kg/m  General Appearance: Well nourished, in no apparent distress. Head:   Normocephalic and atraumatic. Eyes:  sclerae anicteric,conjunctive pink  Respiratory: Respiratory effort normal, BS equal bilaterally without rales, rhonchi, wheezing. Cardio: RRR with no MRGs. Peripheral pulses intact.  Abdomen: Soft,  Non-distended ,active bowel sounds. Mild to moderate tenderness in the periumbilical area. Without guarding and Without rebound. No masses. Rectal: Not evaluated Musculoskeletal: Full ROM, Normal gait. Without edema. Skin:  Dry and intact without significant lesions or rashes Neuro: Alert and  oriented x4;  No focal deficits. Psych:  Cooperative. Normal mood and affect.    Doree Albee, PA-C 12:40 PM

## 2022-07-20 ENCOUNTER — Encounter: Payer: Self-pay | Admitting: Physician Assistant

## 2022-07-20 ENCOUNTER — Ambulatory Visit (INDEPENDENT_AMBULATORY_CARE_PROVIDER_SITE_OTHER): Payer: Medicaid Other | Admitting: Physician Assistant

## 2022-07-20 ENCOUNTER — Other Ambulatory Visit (INDEPENDENT_AMBULATORY_CARE_PROVIDER_SITE_OTHER): Payer: Medicaid Other

## 2022-07-20 ENCOUNTER — Other Ambulatory Visit (HOSPITAL_COMMUNITY): Payer: Self-pay

## 2022-07-20 ENCOUNTER — Ambulatory Visit (INDEPENDENT_AMBULATORY_CARE_PROVIDER_SITE_OTHER)
Admission: RE | Admit: 2022-07-20 | Discharge: 2022-07-20 | Disposition: A | Discharge status: Home / Self Care | Payer: Medicaid Other | Source: Ambulatory Visit | Attending: Physician Assistant | Admitting: Physician Assistant

## 2022-07-20 VITALS — BP 90/68 | HR 77 | Ht 70.0 in | Wt 178.0 lb

## 2022-07-20 DIAGNOSIS — K50119 Crohn's disease of large intestine with unspecified complications: Secondary | ICD-10-CM

## 2022-07-20 DIAGNOSIS — I5042 Chronic combined systolic (congestive) and diastolic (congestive) heart failure: Secondary | ICD-10-CM | POA: Diagnosis not present

## 2022-07-20 DIAGNOSIS — K8681 Exocrine pancreatic insufficiency: Secondary | ICD-10-CM | POA: Diagnosis not present

## 2022-07-20 DIAGNOSIS — K56699 Other intestinal obstruction unspecified as to partial versus complete obstruction: Secondary | ICD-10-CM

## 2022-07-20 DIAGNOSIS — R197 Diarrhea, unspecified: Secondary | ICD-10-CM

## 2022-07-20 DIAGNOSIS — K5000 Crohn's disease of small intestine without complications: Secondary | ICD-10-CM | POA: Diagnosis not present

## 2022-07-20 DIAGNOSIS — I2510 Atherosclerotic heart disease of native coronary artery without angina pectoris: Secondary | ICD-10-CM

## 2022-07-20 LAB — CBC WITH DIFFERENTIAL/PLATELET
Basophils Absolute: 0.1 10*3/uL (ref 0.0–0.1)
Basophils Relative: 0.9 % (ref 0.0–3.0)
Eosinophils Absolute: 0.2 10*3/uL (ref 0.0–0.7)
Eosinophils Relative: 1.7 % (ref 0.0–5.0)
HCT: 40.9 % (ref 36.0–46.0)
Hemoglobin: 13.8 g/dL (ref 12.0–15.0)
Lymphocytes Relative: 13.4 % (ref 12.0–46.0)
Lymphs Abs: 1.5 10*3/uL (ref 0.7–4.0)
MCHC: 33.8 g/dL (ref 30.0–36.0)
MCV: 87.4 fl (ref 78.0–100.0)
Monocytes Absolute: 0.7 10*3/uL (ref 0.1–1.0)
Monocytes Relative: 6.3 % (ref 3.0–12.0)
Neutro Abs: 8.8 10*3/uL — ABNORMAL HIGH (ref 1.4–7.7)
Neutrophils Relative %: 77.7 % — ABNORMAL HIGH (ref 43.0–77.0)
Platelets: 364 10*3/uL (ref 150.0–400.0)
RBC: 4.68 Mil/uL (ref 3.87–5.11)
RDW: 13.9 % (ref 11.5–15.5)
WBC: 11.3 10*3/uL — ABNORMAL HIGH (ref 4.0–10.5)

## 2022-07-20 LAB — BASIC METABOLIC PANEL
BUN: 24 mg/dL — ABNORMAL HIGH (ref 6–23)
CO2: 28 mEq/L (ref 19–32)
Calcium: 9.2 mg/dL (ref 8.4–10.5)
Chloride: 102 mEq/L (ref 96–112)
Creatinine, Ser: 0.86 mg/dL (ref 0.40–1.20)
GFR: 76.44 mL/min (ref >60.00)
Glucose, Bld: 85 mg/dL (ref 70–99)
Potassium: 4.5 mEq/L (ref 3.5–5.1)
Sodium: 137 mEq/L (ref 135–145)

## 2022-07-20 LAB — HEPATIC FUNCTION PANEL
ALT: 37 U/L — ABNORMAL HIGH (ref 0–35)
AST: 26 U/L (ref 0–37)
Albumin: 4.2 g/dL (ref 3.5–5.2)
Alkaline Phosphatase: 92 U/L (ref 39–117)
Bilirubin, Direct: 0 mg/dL (ref 0.0–0.3)
Total Bilirubin: 0.3 mg/dL (ref 0.2–1.2)
Total Protein: 7 g/dL (ref 6.0–8.3)

## 2022-07-20 LAB — HIGH SENSITIVITY CRP: CRP, High Sensitivity: 1.96 mg/L (ref 0.000–5.000)

## 2022-07-20 LAB — SEDIMENTATION RATE: Sed Rate: 39 mm/hr — ABNORMAL HIGH (ref 0–30)

## 2022-07-20 MED ORDER — PREDNISONE 10 MG PO TABS
ORAL_TABLET | ORAL | 0 refills | Status: AC
  Filled 2022-07-20: qty 109, 50d supply, fill #0
  Filled 2022-07-20: qty 84, 28d supply, fill #0
  Filled 2022-08-09: qty 25, 22d supply, fill #1

## 2022-07-20 NOTE — Patient Instructions (Addendum)
Your provider has requested that you go to the basement level for lab work before leaving today. Press "B" on the elevator. The lab is located at the first door on the left as you exit the elevator. Your provider has requested that you have an abdominal x ray before leaving today. Please go to the basement floor to our Radiology department for the test.  Check out [email or web address]  Due to recent changes in healthcare laws, you may see the results of your imaging and laboratory studies on MyChart before your provider has had a chance to review them.  We understand that in some cases there may be results that are confusing or concerning to you. Not all laboratory results come back in the same time frame and the provider may be waiting for multiple results in order to interpret others.  Please give [place] 48 hours in order for your provider to thoroughly review all the results before contacting the office for clarification of your results.

## 2022-07-20 NOTE — Progress Notes (Signed)
Agree with the assessment and plan as outlined by Quentin Mulling, PA-C.  At this juncture, I agree that we need to escalate her Crohn's therapy.  Based on her significant cardiac disease, best option is likely Entyvio.  She has a negative QuantiFERON gold and hepatitis B.  Will need hepatitis B vaccine series, but otherwise I feel it is very reasonable to start the approval process for Entyvio induction.  Navi Ewton, DO, [facility]

## 2022-07-23 ENCOUNTER — Other Ambulatory Visit (HOSPITAL_COMMUNITY): Payer: Self-pay | Admitting: Family Medicine

## 2022-07-23 ENCOUNTER — Encounter: Payer: Self-pay | Admitting: Gastroenterology

## 2022-07-23 ENCOUNTER — Other Ambulatory Visit: Payer: Medicaid Other

## 2022-07-23 DIAGNOSIS — K56699 Other intestinal obstruction unspecified as to partial versus complete obstruction: Secondary | ICD-10-CM

## 2022-07-23 DIAGNOSIS — R197 Diarrhea, unspecified: Secondary | ICD-10-CM

## 2022-07-23 DIAGNOSIS — K50119 Crohn's disease of large intestine with unspecified complications: Secondary | ICD-10-CM

## 2022-07-23 DIAGNOSIS — K5000 Crohn's disease of small intestine without complications: Secondary | ICD-10-CM | POA: Insufficient documentation

## 2022-07-23 NOTE — Addendum Note (Signed)
Addended by: Shellia Cleverly on: 07/23/2022 04:47 PM   Modules accepted: Orders

## 2022-07-24 ENCOUNTER — Other Ambulatory Visit (HOSPITAL_COMMUNITY): Payer: Self-pay | Admitting: Family Medicine

## 2022-07-24 ENCOUNTER — Other Ambulatory Visit (HOSPITAL_COMMUNITY): Payer: Self-pay

## 2022-07-24 ENCOUNTER — Encounter (HOSPITAL_COMMUNITY): Payer: Self-pay

## 2022-07-24 ENCOUNTER — Other Ambulatory Visit: Payer: Self-pay

## 2022-07-24 ENCOUNTER — Telehealth: Payer: Self-pay | Admitting: Pharmacy Technician

## 2022-07-24 LAB — THIOPURINE METHYLTRANSFERASE (TPMT), RBC: Thiopurine Methyltransferase, RBC: 11 nmol/hr/mL RBC — ABNORMAL LOW

## 2022-07-24 LAB — GI PROFILE, STOOL, PCR

## 2022-07-24 LAB — HEPATITIS B SURFACE ANTIGEN: Hepatitis B Surface Ag: NONREACTIVE

## 2022-07-24 LAB — HEPATITIS B SURFACE ANTIBODY,QUALITATIVE: Hep B S Ab: NONREACTIVE

## 2022-07-24 LAB — CLOSTRIDIUM DIFFICILE TOXIN B, QUALITATIVE, REAL-TIME PCR: Toxigenic C. Difficile by PCR: NOT DETECTED

## 2022-07-24 MED ORDER — VANCOMYCIN HCL 125 MG PO CAPS
125.0000 mg | ORAL_CAPSULE | Freq: Four times a day (QID) | ORAL | 0 refills | Status: AC
  Filled 2022-07-24: qty 40, 10d supply, fill #0

## 2022-07-24 NOTE — Telephone Encounter (Signed)
Dr. Barron Alvine,  Unfortunately we will not be able to treat the patient due to contact pricing/insurance. To prevent delay in treatment  please refer to patient to [facility],. Phone: [phone]  Thanks Selena Batten

## 2022-07-25 ENCOUNTER — Other Ambulatory Visit: Payer: Self-pay

## 2022-07-25 ENCOUNTER — Encounter: Payer: Self-pay | Admitting: Gastroenterology

## 2022-07-25 ENCOUNTER — Other Ambulatory Visit (HOSPITAL_COMMUNITY): Payer: Self-pay | Admitting: Pharmacy Technician

## 2022-07-25 DIAGNOSIS — K50119 Crohn's disease of large intestine with unspecified complications: Secondary | ICD-10-CM

## 2022-07-25 NOTE — Telephone Encounter (Signed)
See notes below. Pt scheduled for entyvio at [facility]. First appt is scheduled for 08/02/22 at 10am. Pt aware of appt. Pt may need prior auth.

## 2022-07-26 NOTE — Telephone Encounter (Signed)
Dr. Barron Alvine, this patient was started on Vancomycin for C. Diff on 07/24/22. She was also prescribed a Prednisone taper by Quentin Mulling, PA on 07/20/22. First Entyvio infusion is on 08/02/22. Please advise. Thanks

## 2022-07-28 LAB — CALPROTECTIN: Calprotectin: 294 mcg/g — ABNORMAL HIGH

## 2022-07-31 ENCOUNTER — Encounter: Payer: Self-pay | Admitting: Pharmacist

## 2022-08-01 ENCOUNTER — Ambulatory Visit (HOSPITAL_COMMUNITY)
Admission: RE | Admit: 2022-08-01 | Discharge: 2022-08-01 | Disposition: A | Discharge status: Home / Self Care | Payer: Medicaid Other | Source: Ambulatory Visit | Attending: Internal Medicine | Admitting: Internal Medicine

## 2022-08-01 DIAGNOSIS — I252 Old myocardial infarction: Secondary | ICD-10-CM | POA: Diagnosis not present

## 2022-08-01 DIAGNOSIS — E785 Hyperlipidemia, unspecified: Secondary | ICD-10-CM | POA: Diagnosis not present

## 2022-08-01 DIAGNOSIS — I5022 Chronic systolic (congestive) heart failure: Secondary | ICD-10-CM | POA: Diagnosis not present

## 2022-08-01 DIAGNOSIS — R0602 Shortness of breath: Secondary | ICD-10-CM | POA: Diagnosis not present

## 2022-08-01 DIAGNOSIS — F17200 Nicotine dependence, unspecified, uncomplicated: Secondary | ICD-10-CM | POA: Insufficient documentation

## 2022-08-01 DIAGNOSIS — I509 Heart failure, unspecified: Secondary | ICD-10-CM | POA: Diagnosis not present

## 2022-08-01 DIAGNOSIS — I2510 Atherosclerotic heart disease of native coronary artery without angina pectoris: Secondary | ICD-10-CM | POA: Insufficient documentation

## 2022-08-01 DIAGNOSIS — G4730 Sleep apnea, unspecified: Secondary | ICD-10-CM | POA: Diagnosis not present

## 2022-08-01 LAB — ECHOCARDIOGRAM COMPLETE
Area-P 1/2: 3.31 cm2
Calc EF: 53 %
S' Lateral: 3.9 cm
Single Plane A2C EF: 55.7 %
Single Plane A4C EF: 46 %

## 2022-08-01 MED ORDER — PERFLUTREN LIPID MICROSPHERE
1.0000 mL | INTRAVENOUS | Status: AC | PRN
  Administered 2022-08-01: 6 mL via INTRAVENOUS

## 2022-08-01 NOTE — Progress Notes (Signed)
Echocardiogram 2D Echocardiogram has been performed.  Augustine Radar 08/01/2022, 12:23 PM

## 2022-08-02 ENCOUNTER — Encounter (HOSPITAL_COMMUNITY): Payer: Self-pay

## 2022-08-02 ENCOUNTER — Other Ambulatory Visit (HOSPITAL_COMMUNITY): Payer: Self-pay

## 2022-08-02 ENCOUNTER — Other Ambulatory Visit: Payer: Self-pay | Admitting: Internal Medicine

## 2022-08-02 ENCOUNTER — Ambulatory Visit (HOSPITAL_COMMUNITY)
Admission: RE | Admit: 2022-08-02 | Discharge: 2022-08-02 | Disposition: A | Discharge status: Home / Self Care | Payer: Medicaid Other | Source: Ambulatory Visit | Attending: Gastroenterology | Admitting: Gastroenterology

## 2022-08-02 DIAGNOSIS — I5022 Chronic systolic (congestive) heart failure: Secondary | ICD-10-CM

## 2022-08-02 DIAGNOSIS — K50119 Crohn's disease of large intestine with unspecified complications: Secondary | ICD-10-CM | POA: Diagnosis not present

## 2022-08-02 MED ORDER — VEDOLIZUMAB 300 MG IV SOLR
300.0000 mg | Freq: Once | INTRAVENOUS | Status: DC
  Administered 2022-08-02: 300 mg via INTRAVENOUS
  Filled 2022-08-02: qty 5

## 2022-08-02 MED ORDER — METOLAZONE 2.5 MG PO TABS
2.5000 mg | ORAL_TABLET | Freq: Every day | ORAL | 0 refills | Status: DC
  Filled 2022-08-02: qty 3, 3d supply, fill #0

## 2022-08-02 NOTE — Telephone Encounter (Signed)
Patient on abx for Cdiff and recently started prednisone. Some hesitancy to increase diuretics if having diarrhea without more information.   Please see if she can send ICD transmission so we can check HL score.

## 2022-08-03 ENCOUNTER — Other Ambulatory Visit (HOSPITAL_COMMUNITY): Payer: Self-pay

## 2022-08-03 NOTE — Telephone Encounter (Signed)
Should be for Entyvio 300 mg vial. Pt received her 1st infusion yesterday so I am not sure if PA is needed. Thanks

## 2022-08-03 NOTE — Telephone Encounter (Signed)
Will this PA be for the  pen  vial?

## 2022-08-03 NOTE — Telephone Encounter (Signed)
Ok. Infusions would be medical, and I currently do not do PA's for medical. If patient transitions to the pens which would fall under pharmacy benefits, please let me know. Test billing results does show that a PA will be required for the pen through her pharmacy benefits.

## 2022-08-05 ENCOUNTER — Other Ambulatory Visit: Payer: Self-pay | Admitting: Internal Medicine

## 2022-08-06 ENCOUNTER — Other Ambulatory Visit: Payer: Self-pay

## 2022-08-06 ENCOUNTER — Ambulatory Visit (HOSPITAL_COMMUNITY)
Admission: RE | Admit: 2022-08-06 | Discharge: 2022-08-06 | Disposition: A | Discharge status: Home / Self Care | Payer: Medicaid Other | Source: Ambulatory Visit | Attending: Cardiology | Admitting: Cardiology

## 2022-08-06 ENCOUNTER — Encounter (HOSPITAL_COMMUNITY): Payer: Self-pay

## 2022-08-06 ENCOUNTER — Ambulatory Visit: Payer: Medicaid Other | Admitting: Physician Assistant

## 2022-08-06 DIAGNOSIS — I5022 Chronic systolic (congestive) heart failure: Secondary | ICD-10-CM

## 2022-08-06 DIAGNOSIS — K50119 Crohn's disease of large intestine with unspecified complications: Secondary | ICD-10-CM

## 2022-08-06 LAB — BASIC METABOLIC PANEL
Anion gap: 16 — ABNORMAL HIGH (ref 5–15)
BUN: 33 mg/dL — ABNORMAL HIGH (ref 6–20)
CO2: 34 mmol/L — ABNORMAL HIGH (ref 22–32)
Calcium: 10.8 mg/dL — ABNORMAL HIGH (ref 8.9–10.3)
Chloride: 85 mmol/L — ABNORMAL LOW (ref 98–111)
Creatinine, Ser: 1.14 mg/dL — ABNORMAL HIGH (ref 0.44–1.00)
GFR, Estimated: 57 mL/min — ABNORMAL LOW (ref >60)
Glucose, Bld: 163 mg/dL — ABNORMAL HIGH (ref 70–99)
Potassium: 2.9 mmol/L — ABNORMAL LOW (ref 3.5–5.1)
Sodium: 135 mmol/L (ref 135–145)

## 2022-08-06 LAB — BRAIN NATRIURETIC PEPTIDE: B Natriuretic Peptide: 117 pg/mL — ABNORMAL HIGH (ref 0.0–100.0)

## 2022-08-06 NOTE — Telephone Encounter (Signed)
Please see note below. Amy Hazelwood used to do prior auths for pts getting this medication at the hospital. I guess we need to send it to you guys now?

## 2022-08-07 ENCOUNTER — Encounter: Payer: Self-pay | Admitting: Internal Medicine

## 2022-08-07 ENCOUNTER — Telehealth: Payer: Self-pay | Admitting: Physician Assistant

## 2022-08-07 ENCOUNTER — Other Ambulatory Visit (HOSPITAL_COMMUNITY): Payer: Self-pay

## 2022-08-07 NOTE — Telephone Encounter (Signed)
This medication wasn't prescribed by Marchelle Folks it seems it has been prescribed by PCP, Marchelle Folks seen patient on 4/5 but It seems Sharol Harness was handling this with Dr Carloyn Manner and the patient

## 2022-08-07 NOTE — Telephone Encounter (Addendum)
Inbound call from patient, is requesting a refill on Lomitil, states it wasn't prescribed by [place] but would like it refilled. Patient would also like to discuss prednisone, states that she was told she would be able to stop once she started on Entyvio.

## 2022-08-08 ENCOUNTER — Encounter: Payer: Self-pay | Admitting: Gastroenterology

## 2022-08-08 ENCOUNTER — Other Ambulatory Visit: Payer: Self-pay

## 2022-08-08 ENCOUNTER — Other Ambulatory Visit: Payer: Self-pay | Admitting: Internal Medicine

## 2022-08-08 ENCOUNTER — Other Ambulatory Visit (HOSPITAL_COMMUNITY): Payer: Self-pay

## 2022-08-08 MED ORDER — DIPHENOXYLATE-ATROPINE 2.5-0.025 MG PO TABS: 2.0000 | ORAL_TABLET | Freq: Four times a day (QID) | ORAL | 3 refills | Status: DC | PRN

## 2022-08-08 NOTE — Telephone Encounter (Signed)
Called and spoke with Rosanne Ashing at [facility] pharmacy. Gave verbal order for Lomotil 2 tablets QID PRN for diarrhea or loose stools. #60, RF: 3. Patient notified of recommendations via MyChart.

## 2022-08-08 NOTE — Telephone Encounter (Signed)
See 08/08/22 patient message for details.

## 2022-08-09 ENCOUNTER — Other Ambulatory Visit (HOSPITAL_COMMUNITY): Payer: Self-pay

## 2022-08-09 ENCOUNTER — Ambulatory Visit (HOSPITAL_COMMUNITY)
Admission: RE | Admit: 2022-08-09 | Discharge: 2022-08-09 | Disposition: A | Discharge status: Home / Self Care | Payer: Medicaid Other | Source: Ambulatory Visit | Attending: Cardiology | Admitting: Cardiology

## 2022-08-09 ENCOUNTER — Other Ambulatory Visit: Payer: Self-pay

## 2022-08-09 DIAGNOSIS — I5042 Chronic combined systolic (congestive) and diastolic (congestive) heart failure: Secondary | ICD-10-CM | POA: Insufficient documentation

## 2022-08-09 LAB — BASIC METABOLIC PANEL
Anion gap: 14 (ref 5–15)
BUN: 22 mg/dL — ABNORMAL HIGH (ref 6–20)
CO2: 29 mmol/L (ref 22–32)
Calcium: 9.6 mg/dL (ref 8.9–10.3)
Chloride: 93 mmol/L — ABNORMAL LOW (ref 98–111)
Creatinine, Ser: 0.99 mg/dL (ref 0.44–1.00)
GFR, Estimated: >60 mL/min (ref >60)
Glucose, Bld: 129 mg/dL — ABNORMAL HIGH (ref 70–99)
Potassium: 3.9 mmol/L (ref 3.5–5.1)
Sodium: 136 mmol/L (ref 135–145)

## 2022-08-09 MED ORDER — DIPHENOXYLATE-ATROPINE 2.5-0.025 MG PO TABS
2.0000 | ORAL_TABLET | Freq: Four times a day (QID) | ORAL | 3 refills | Status: DC | PRN
  Filled 2022-08-09: qty 60, 8d supply, fill #0
  Filled 2022-09-06: qty 60, 8d supply, fill #1
  Filled 2022-10-16: qty 60, 8d supply, fill #2
  Filled 2022-11-05: qty 60, 8d supply, fill #3

## 2022-08-13 ENCOUNTER — Encounter: Payer: Self-pay | Admitting: Gastroenterology

## 2022-08-15 DIAGNOSIS — Z419 Encounter for procedure for purposes other than remedying health state, unspecified: Secondary | ICD-10-CM | POA: Diagnosis not present

## 2022-08-16 ENCOUNTER — Encounter (HOSPITAL_COMMUNITY)
Admission: RE | Admit: 2022-08-16 | Discharge: 2022-08-16 | Disposition: A | Discharge status: Home / Self Care | Payer: Medicaid Other | Source: Ambulatory Visit | Attending: Gastroenterology | Admitting: Gastroenterology

## 2022-08-16 DIAGNOSIS — K50119 Crohn's disease of large intestine with unspecified complications: Secondary | ICD-10-CM | POA: Diagnosis not present

## 2022-08-16 MED ORDER — VEDOLIZUMAB 300 MG IV SOLR
300.0000 mg | Freq: Once | INTRAVENOUS | Status: AC
  Administered 2022-08-16: 300 mg via INTRAVENOUS
  Filled 2022-08-16: qty 5

## 2022-08-17 ENCOUNTER — Encounter (HOSPITAL_COMMUNITY): Payer: Self-pay

## 2022-08-22 ENCOUNTER — Other Ambulatory Visit: Payer: Self-pay | Admitting: Internal Medicine

## 2022-08-22 ENCOUNTER — Other Ambulatory Visit (HOSPITAL_COMMUNITY): Payer: Self-pay

## 2022-08-22 ENCOUNTER — Other Ambulatory Visit: Payer: Self-pay

## 2022-08-22 DIAGNOSIS — I5020 Unspecified systolic (congestive) heart failure: Secondary | ICD-10-CM

## 2022-08-22 MED ORDER — FUROSEMIDE 40 MG PO TABS
80.0000 mg | ORAL_TABLET | Freq: Two times a day (BID) | ORAL | 3 refills | Status: DC
Start: 2022-08-22 — End: 2022-11-30
  Filled 2022-08-22 – 2022-08-23 (×2): qty 120, 30d supply, fill #0
  Filled 2022-09-17: qty 120, 30d supply, fill #1
  Filled 2022-10-07 – 2022-10-09 (×2): qty 120, 30d supply, fill #2
  Filled 2022-11-02: qty 120, 30d supply, fill #3

## 2022-08-23 ENCOUNTER — Other Ambulatory Visit (HOSPITAL_COMMUNITY): Payer: Self-pay

## 2022-08-24 NOTE — Progress Notes (Signed)
Remote ICD transmission.   

## 2022-08-30 ENCOUNTER — Encounter (HOSPITAL_COMMUNITY)
Admission: RE | Admit: 2022-08-30 | Discharge: 2022-08-30 | Disposition: A | Discharge status: Home / Self Care | Payer: Medicaid Other | Source: Ambulatory Visit | Attending: Gastroenterology | Admitting: Gastroenterology

## 2022-09-01 ENCOUNTER — Other Ambulatory Visit: Payer: Self-pay | Admitting: Gastroenterology

## 2022-09-03 ENCOUNTER — Other Ambulatory Visit: Payer: Self-pay

## 2022-09-05 DIAGNOSIS — K8681 Exocrine pancreatic insufficiency: Secondary | ICD-10-CM | POA: Diagnosis not present

## 2022-09-05 DIAGNOSIS — I110 Hypertensive heart disease with heart failure: Secondary | ICD-10-CM | POA: Diagnosis not present

## 2022-09-05 DIAGNOSIS — I252 Old myocardial infarction: Secondary | ICD-10-CM | POA: Diagnosis not present

## 2022-09-05 DIAGNOSIS — I4891 Unspecified atrial fibrillation: Secondary | ICD-10-CM | POA: Diagnosis not present

## 2022-09-05 DIAGNOSIS — I509 Heart failure, unspecified: Secondary | ICD-10-CM | POA: Diagnosis not present

## 2022-09-05 DIAGNOSIS — Z95810 Presence of automatic (implantable) cardiac defibrillator: Secondary | ICD-10-CM | POA: Diagnosis not present

## 2022-09-05 DIAGNOSIS — F419 Anxiety disorder, unspecified: Secondary | ICD-10-CM | POA: Diagnosis not present

## 2022-09-05 DIAGNOSIS — F320 Major depressive disorder, single episode, mild: Secondary | ICD-10-CM | POA: Diagnosis not present

## 2022-09-05 DIAGNOSIS — K5090 Crohn's disease, unspecified, without complications: Secondary | ICD-10-CM | POA: Diagnosis not present

## 2022-09-05 DIAGNOSIS — D6869 Other thrombophilia: Secondary | ICD-10-CM | POA: Diagnosis not present

## 2022-09-05 DIAGNOSIS — E785 Hyperlipidemia, unspecified: Secondary | ICD-10-CM | POA: Diagnosis not present

## 2022-09-06 ENCOUNTER — Encounter (HOSPITAL_COMMUNITY): Payer: Self-pay

## 2022-09-06 ENCOUNTER — Other Ambulatory Visit: Payer: Self-pay | Admitting: Gastroenterology

## 2022-09-06 ENCOUNTER — Other Ambulatory Visit (HOSPITAL_COMMUNITY): Payer: Self-pay

## 2022-09-06 ENCOUNTER — Other Ambulatory Visit (HOSPITAL_COMMUNITY): Payer: Self-pay | Admitting: Physician Assistant

## 2022-09-06 MED ORDER — PROMETHAZINE HCL 25 MG PO TABS
25.0000 mg | ORAL_TABLET | Freq: Four times a day (QID) | ORAL | 2 refills | Status: DC
  Filled 2022-09-06: qty 90, 23d supply, fill #0
  Filled 2022-10-01: qty 90, 23d supply, fill #1
  Filled 2022-10-30: qty 90, 23d supply, fill #2

## 2022-09-07 ENCOUNTER — Other Ambulatory Visit: Payer: Self-pay

## 2022-09-07 ENCOUNTER — Other Ambulatory Visit (HOSPITAL_COMMUNITY): Payer: Self-pay

## 2022-09-07 MED ORDER — METOLAZONE 2.5 MG PO TABS
2.5000 mg | ORAL_TABLET | Freq: Every day | ORAL | 0 refills | Status: DC
  Filled 2022-09-07: qty 3, 3d supply, fill #0

## 2022-09-13 ENCOUNTER — Ambulatory Visit: Payer: Medicaid Other | Attending: Internal Medicine | Admitting: Internal Medicine

## 2022-09-13 ENCOUNTER — Encounter: Payer: Self-pay | Admitting: Internal Medicine

## 2022-09-13 ENCOUNTER — Encounter (HOSPITAL_COMMUNITY)
Admission: RE | Admit: 2022-09-13 | Discharge: 2022-09-13 | Disposition: A | Discharge status: Home / Self Care | Payer: Medicaid Other | Source: Ambulatory Visit | Attending: Gastroenterology | Admitting: Gastroenterology

## 2022-09-13 VITALS — BP 106/73 | HR 107 | Temp 98.4°F | Ht 70.0 in | Wt 177.0 lb

## 2022-09-13 DIAGNOSIS — R232 Flushing: Secondary | ICD-10-CM | POA: Diagnosis not present

## 2022-09-13 DIAGNOSIS — I2510 Atherosclerotic heart disease of native coronary artery without angina pectoris: Secondary | ICD-10-CM

## 2022-09-13 DIAGNOSIS — K50118 Crohn's disease of large intestine with other complication: Secondary | ICD-10-CM | POA: Diagnosis not present

## 2022-09-13 DIAGNOSIS — F320 Major depressive disorder, single episode, mild: Secondary | ICD-10-CM | POA: Diagnosis not present

## 2022-09-13 DIAGNOSIS — L309 Dermatitis, unspecified: Secondary | ICD-10-CM

## 2022-09-13 DIAGNOSIS — I5020 Unspecified systolic (congestive) heart failure: Secondary | ICD-10-CM | POA: Diagnosis not present

## 2022-09-13 DIAGNOSIS — K50119 Crohn's disease of large intestine with unspecified complications: Secondary | ICD-10-CM | POA: Diagnosis not present

## 2022-09-13 MED ORDER — VEDOLIZUMAB 300 MG IV SOLR
300.0000 mg | Freq: Once | INTRAVENOUS | Status: AC
  Administered 2022-09-13: 300 mg via INTRAVENOUS
  Filled 2022-09-13: qty 5

## 2022-09-13 NOTE — Progress Notes (Signed)
Patient ID: Christine [name], female    DOB: 12-31-1965  MRN: [id number]  CC: Hypertension (HTN f/u. Leslie Dales lesions on legs X2 mo/Night sweats, waking up at night x2 yrs /No to shingles vax.)   Subjective: Christine [name] is a 57 y.o. female who presents for chronic disease management. 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   Patient reports having skin lesions on her extremities for the past 2 months.  Gi thinks it may be Erythema nodosum.  Starts as red spots that are itchy; sore when on jt.  About 4 new spots a day.  Little bruising.   -no one else in house with rash; no bed bugs, no new body products -using OTC Benadryl and Hydrocortiosone  Complains of waking up with night sweats for the past 2 years; worse since having heart attack 2 yrs ago. Wakes about 2-3 times at nights, also episodes during the day.  Had hysterectomy age 88, both ovaries left in place.  Last TSH 11/2021 was nl.   Crohn's disease: Entocort was discontinued by her gastroenterologist.  Now on Entyvio Q .   Had 3rd dose today.  Tolerating ok other than fatigue Recently treated for C. difficile.  CAD/chronic systolic CHF/LV thrombus/HL: Seen by Dr. Gala Romney in March. Reports compliance with atorvastatin 80 mg daily digoxin 0.125 mg daily, furosemide 80 mg twice a day, metoprolol 25 mg daily, potassium chloride 100 mEq twice a day, spironolactone 12.5 mg daily, Farxiga 10 mg daily   anxiety, ADHD, depression, PTSD:  followed by Dr. Lyanne Co.  Currently on Buproprion, Ambien, Flexeril and Xanax.  Feels stable on her medications.   Wellbutirn increased to 150 mg BID a few wks ago  HM:  wants to hold off on Zoster 2 vaccine; told to hold off while on  Entyvio Patient Active Problem List   Diagnosis Date Noted   Crohn's disease involving terminal ileum (HCC) 07/23/2022   Crohn's disease of large intestine with other complication (HCC) 05/11/2022   Mild major depression (HCC) 05/11/2022   Paroxysmal tachycardia, unspecified (HCC) 05/11/2022   Essential hypertension 03/02/2022   Acute pancreatitis 03/01/2022   Hypokalemia 03/01/2022   Ileitis 03/01/2022   Diarrhea 02/26/2022   Nausea without vomiting 02/26/2022   Loss of weight 02/26/2022   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   Ulcer of ileum 02/26/2022   Diverticulosis of colon without hemorrhage 02/26/2022   Cardiomyopathy (HCC) ischemic and non-ischemic 01/16/2022   Abdominal aortic aneurysm (AAA) 3.0 cm to 5.0 cm in diameter in female (HCC) 01/09/2022   Polycythemia 01/09/2022   Chronic combined systolic and diastolic heart failure (HCC) 08/24/2021   CAD in native artery 08/24/2021   S/P ICD (internal cardiac defibrillator) procedure 08/10/2021   Obstructive sleep apnea 05/05/2021   Former smoker 02/06/2021   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   Obesity (BMI 30.0-34.9) 02/06/2021   Anxiety 01/20/2021   Tobacco abuse 01/20/2021   Shortness of breath 01/20/2021   Chest tightness      Current Outpatient Medications on File Prior to Visit  Medication Sig Dispense Refill   acetaminophen (TYLENOL) 500 MG tablet Take 1,000 mg by mouth 3 (three) times daily as needed for moderate pain or headache.     ALPRAZolam (XANAX) 1 MG tablet Take  2 mg by mouth 3 (three) times daily.     alum & mag hydroxide-simeth (MAALOX/MYLANTA) 200-200-20 MG/5ML suspension Take 30 mLs by mouth every 4 (four) hours as needed for indigestion or heartburn. 355 mL 0   apixaban (ELIQUIS) 5 MG TABS tablet Take 1 tablet (5 mg total) by mouth 2 (two) times daily. 60 tablet 4   atorvastatin (LIPITOR) 80 MG tablet Take 1 tablet (80 mg total) by mouth daily. 90 tablet 3   buPROPion (WELLBUTRIN XL) 150 MG 24 hr  tablet Take 150 mg by mouth daily.     cyclobenzaprine (FLEXERIL) 10 MG tablet Take 20 mg by mouth at bedtime.     dapagliflozin propanediol (FARXIGA) 10 MG TABS tablet Take 1 tablet (10 mg total) by mouth daily. 30 tablet 11   digoxin (LANOXIN) 0.125 MG tablet Take 1 tablet (0.125 mg total) by mouth daily. 30 tablet 3   diphenhydrAMINE (BENADRYL) 25 MG tablet Take 50 mg by mouth as needed for allergies.     diphenoxylate-atropine (LOMOTIL) 2.5-0.025 MG tablet Take 2 tablets by mouth 4 (four) times daily as needed for diarrhea/loose stools. 60 tablet 3   feeding supplement (ENSURE ENLIVE / ENSURE PLUS) LIQD Take 237 mLs by mouth 2 (two) times daily between meals. 237 mL 12   furosemide (LASIX) 40 MG tablet Take 2 tablets (80 mg total) by mouth in the morning and every evening 120 tablet 3   ivabradine (CORLANOR) 5 MG TABS tablet Take 1 tablet (5 mg total) by mouth 2 (two) times daily. 60 tablet 6   lipase/protease/amylase (CREON) 36000 UNITS CPEP capsule Take 2 capsules ( 72000 units) with meals and 1 capsule ( 36000 units) with snack. (Patient taking differently: 36,000 Units. Take 1 tablet before breakfast) 300 capsule 5   metolazone (ZAROXOLYN) 2.5 MG tablet Take 1 tablet (2.5 mg total) by mouth daily. 3 tablet 0   metoprolol succinate (TOPROL-XL) 25 MG 24 hr tablet Take 1 tablet (25 mg total) by mouth daily. Take with or immediately following a meal. 30 tablet 11   metroNIDAZOLE (METROGEL) 1 % gel Apply topically as needed.     Multiple Vitamins-Minerals (MULTIVITAMIN WITH MINERALS) tablet Take 1 tablet by mouth daily.     oxymetazoline (AFRIN) 0.05 % nasal spray Place 1-2 sprays into both nostrils 2 (two) times daily as needed for congestion.     potassium chloride (KLOR-CON) 10 MEQ tablet Take 10 tablets (100 mEq total) by mouth 2 (two) times daily. 600 tablet 2   promethazine (PHENERGAN) 25 MG tablet Take 1 tablet (25 mg total) by mouth every 6 (six) to 8 (eight) hours for nausea or vomiting.  90 tablet 2   spironolactone (ALDACTONE) 25 MG tablet Take 1 tablet (25 mg total) by mouth daily. 45 tablet 3   zolpidem (AMBIEN) 10 MG tablet Take 5-10 mg by mouth at bedtime.     No current facility-administered medications on file prior to visit.    Allergies  Allergen Reactions   Sulfa Antibiotics Hives, Itching and Rash   Erythromycin Hives   Tramadol Rash and Other (See Comments)    Urinary retention     Ibuprofen Itching   Tape Rash    Social History   Socioeconomic History   Marital status: Single    Spouse name: Not on file   Number of children: 0   Years of education: 16   Highest education level: Bachelor's degree (e.g., BA, AB, BS)  Occupational History   Not on file  Tobacco Use   Smoking status: Former    Packs/day: .5    Types: Cigarettes    Quit date: 01/15/2021    Years since quitting: 1.6   Smokeless tobacco: Never  Vaping Use   Vaping Use: Never used  Substance and Sexual Activity   Alcohol use: Yes    Comment: rarely   Drug use: Never   Sexual activity: Not on file  Other Topics Concern   Not on file  Social History Narrative   Not on file   Social Determinants of Health   Financial Resource Strain: Medium Risk (09/12/2022)   Overall Financial Resource Strain (CARDIA)    Difficulty of Paying Living Expenses: Somewhat hard  Food Insecurity: Food Insecurity Present (09/12/2022)   Hunger Vital Sign    Worried About Running Out of Food in the Last Year: Sometimes true    Ran Out of Food in the Last Year: Patient declined  Transportation Needs: No Transportation Needs (09/12/2022)   PRAPARE - Administrator, Civil Service (Medical): No    Lack of Transportation (Non-Medical): No  Physical Activity: Insufficiently Active (09/12/2022)   Exercise Vital Sign    Days of Exercise per Week: 4 days    Minutes of Exercise per Session: 20 min  Stress: No Stress Concern Present (09/12/2022)   [employer] of Occupational Health -  Occupational Stress Questionnaire    Feeling of Stress : Only a little  Social Connections: Moderately Isolated (09/12/2022)   Social Connection and Isolation Panel [NHANES]    Frequency of Communication with Friends and Family: Twice a week    Frequency of Social Gatherings with Friends and Family: Once a week    Attends Religious Services: 1 to 4 times per year    Active Member of [employer] or Organizations: No    Attends Engineer, structural: Not on file    Marital Status: Never married  Catering manager Violence: Not on file    Family History  Problem Relation Age of Onset   Stroke Mother    Atrial fibrillation Mother    Heart failure Father    Stomach cancer Maternal Grandfather    Liver cancer Neg Hx    Esophageal cancer Neg Hx    Colon polyps Neg Hx    Colon cancer Neg Hx     Past Surgical History:  Procedure Laterality Date   ABDOMINAL HYSTERECTOMY     BIOPSY  02/26/2022   Procedure: BIOPSY;  Surgeon: Shellia Cleverly, DO;  Location: [facility];  Service: Gastroenterology;;   CARDIAC CATHETERIZATION     CHOLECYSTECTOMY     COLONOSCOPY WITH PROPOFOL N/A 02/26/2022   Procedure: COLONOSCOPY WITH PROPOFOL;  Surgeon: Shellia Cleverly, DO;  Location: [facility];  Service: Gastroenterology;  Laterality: N/A;   ESOPHAGOGASTRODUODENOSCOPY (EGD) WITH PROPOFOL N/A 02/26/2022   Procedure: ESOPHAGOGASTRODUODENOSCOPY (EGD) WITH PROPOFOL;  Surgeon: Shellia Cleverly, DO;  Location: [facility];  Service: Gastroenterology;  Laterality: N/A;   ICD IMPLANT N/A 07/21/2021   Procedure: ICD IMPLANT;  Surgeon: Duke Salvia, MD;  Location: [facility] INVASIVE CV LAB;  Service: Cardiovascular;  Laterality: N/A;   NECK SURGERY     OVARIAN CYST SURGERY     POLYPECTOMY  02/26/2022   Procedure: POLYPECTOMY;  Surgeon: Shellia Cleverly, DO;  Location: [facility];  Service: Gastroenterology;;   RIGHT/LEFT HEART CATH AND CORONARY ANGIOGRAPHY N/A 01/23/2021   Procedure: RIGHT/LEFT HEART  CATH AND CORONARY ANGIOGRAPHY;  Surgeon: Dolores Patty, MD;  Location:  [facility] INVASIVE CV LAB;  Service: Cardiovascular;  Laterality: N/A;    ROS: Review of Systems Negative except as stated above  PHYSICAL EXAM: BP 106/73 (BP Location: Left Arm, Patient Position: Sitting, Cuff Size: Normal)   Pulse (!) 107   Temp 98.4 F (36.9 C) (Oral)   Ht 5\' 10"  (1.778 m)   Wt 177 lb (80.3 kg)   SpO2 96%   BMI 25.40 kg/m   Physical Exam  General appearance - alert, well appearing, and in no distress Mental status - normal mood, behavior, speech, dress, motor activity, and thought processes Chest - clear to auscultation, no wheezes, rales or rhonchi, symmetric air entry Heart - normal rate, regular rhythm, normal S1, S2, no murmurs, rubs, clicks or gallops Skin -scattered erythematous circular macular lesions on the extremities.  Areas have excoriation from scratching.    09/13/2022    1:41 PM 05/11/2022   11:11 AM 03/20/2022    3:41 PM  Depression screen PHQ 2/9  Decreased Interest 1 2 1   Down, Depressed, Hopeless 2 1 1   PHQ - 2 Score 3 3 2   Altered sleeping 2 2 1   Tired, decreased energy 3 3 1   Change in appetite 2 2 1   Feeling bad or failure about yourself  1 1 1   Trouble concentrating 2 2 3   Moving slowly or fidgety/restless 1 1 1   Suicidal thoughts 0 0 0  PHQ-9 Score 14 14 10       09/13/2022    1:41 PM 05/11/2022   11:11 AM 03/20/2022    3:41 PM 01/09/2022    1:48 PM  GAD 7 : Generalized Anxiety Score  Nervous, Anxious, on Edge 3 3 3 3   Control/stop worrying 2 2 2 3   Worry too much - different things 2 2 2 3   Trouble relaxing 3 3 3 2   Restless 2 1 0 3  Easily annoyed or irritable 3 2 3 3   Afraid - awful might happen 0 0 0 1  Total GAD 7 Score 15 13 13 18          Latest Ref Rng & Units 08/09/2022    2:18 PM 08/06/2022    2:11 PM 07/20/2022   12:10 PM  CMP  Glucose 70 - 99 mg/dL 161  096  85   BUN 6 - 20 mg/dL 22  33  24   Creatinine 0.44 - 1.00 mg/dL 0.45  4.09  8.11    Sodium 135 - 145 mmol/L 136  135  137   Potassium 3.5 - 5.1 mmol/L 3.9  2.9  4.5   Chloride 98 - 111 mmol/L 93  85  102   CO2 22 - 32 mmol/L 29  34  28   Calcium 8.9 - 10.3 mg/dL 9.6  91.4  9.2   Total Protein 6.0 - 8.3 g/dL   7.0   Total Bilirubin 0.2 - 1.2 mg/dL   0.3   Alkaline Phos 39 - 117 U/L   92   AST 0 - 37 U/L   26   ALT 0 - 35 U/L   37    Lipid Panel     Component Value Date/Time   CHOL 185 03/01/2022 0339   TRIG 221 (H) 03/01/2022 0339   HDL 36 (L) 03/01/2022 0339   CHOLHDL 5.1 03/01/2022 0339   VLDL 44 (H) 03/01/2022 0339   LDLCALC 105 (H) 03/01/2022 0339    CBC    Component Value Date/Time   WBC 11.3 (H) 07/20/2022 1210  RBC 4.68 07/20/2022 1210   HGB 13.8 07/20/2022 1210   HGB 14.9 12/25/2021 1119   HGB 17.5 (H) 08/10/2021 1023   HCT 40.9 07/20/2022 1210   HCT 50.7 (H) 08/10/2021 1023   PLT 364.0 07/20/2022 1210   PLT 326 12/25/2021 1119   PLT 374 08/10/2021 1023   MCV 87.4 07/20/2022 1210   MCV 86 08/10/2021 1023   MCH 29.2 03/16/2022 1433   MCHC 33.8 07/20/2022 1210   RDW 13.9 07/20/2022 1210   RDW 13.1 08/10/2021 1023   LYMPHSABS 1.5 07/20/2022 1210   LYMPHSABS 1.8 08/10/2021 1023   MONOABS 0.7 07/20/2022 1210   EOSABS 0.2 07/20/2022 1210   EOSABS 0.3 08/10/2021 1023   BASOSABS 0.1 07/20/2022 1210   BASOSABS 0.1 08/10/2021 1023    ASSESSMENT AND PLAN: 1. Coronary artery disease involving native coronary artery of native heart without angina pectoris Stable.  Continue metoprolol, atorvastatin, - Hepatic Function Panel  2. Hot flashes We discussed that HRT is the best treatment for hot flashes.  However she is not the best candidate for it given that she has had heart attack and history of atrial thrombus.  We discussed other nonhormonal options.  She states that she cannot take Paxil or gabapentin.  We discussed the new medication that is out on the market call Veozah.  Advised that the medication can cause headaches and drug-induced liver  injury.  If we were to use it, we would need to get baseline liver function test and then monitor LFTs at 3, 6 and 9 months.  Patient states that the hot flashes are so bad that she is willing to give the medication a try.  If LFTs are normal, we will start her on the 45 mg dose.  3. 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, NYHA class 2 (HCC) Stable and compensated.  She will continue spironolactone, furosemide, digoxin, metoprolol and Farxiga  4. Crohn's disease of large intestine with other complication (HCC) Followed by GI.  5. Mild major depression (HCC) Plugged in with behavioral health  6. Dermatitis Could be erythema nodosum associated with inflammatory bowel disease versus other dermatitis versus due to Entyvio.  Will try her with triamcinolone and get her in with dermatology. - Ambulatory referral to Dermatology     Patient was given the opportunity to ask questions.  Patient verbalized understanding of the plan and was able to repeat key elements of the plan.   This documentation was completed using Paediatric nurse.  Any transcriptional errors are unintentional.  Orders Placed This Encounter  Procedures   Hepatic Function Panel   Ambulatory referral to Dermatology     Requested Prescriptions    No prescriptions requested or ordered in this encounter    Return in about 4 months (around 01/14/2023).  Jonah Blue, MD, FACP

## 2022-09-13 NOTE — Patient Instructions (Signed)
Managing Hot Flashes During Menopause You will learn what hot flashes/night sweats are, what causes them and what the treatment methods are. To view the content, go to this web address: [email or web address]  This video will expire on: 06/13/2024. If you need access to this video following this date, please reach out to the healthcare provider who assigned it to you. This information is not intended to replace advice given to you by your health care provider. Make sure you discuss any questions you have with your health care provider. Elsevier Patient Education  2024 [employer].

## 2022-09-14 ENCOUNTER — Other Ambulatory Visit: Payer: Self-pay | Admitting: Internal Medicine

## 2022-09-14 DIAGNOSIS — K50118 Crohn's disease of large intestine with other complication: Secondary | ICD-10-CM

## 2022-09-14 LAB — HEPATIC FUNCTION PANEL
ALT: 26 IU/L (ref 0–32)
AST: 28 IU/L (ref 0–40)
Albumin: 4.7 g/dL (ref 3.8–4.9)
Alkaline Phosphatase: 143 IU/L — ABNORMAL HIGH (ref 44–121)
Bilirubin Total: 0.4 mg/dL (ref 0.0–1.2)
Bilirubin, Direct: 0.12 mg/dL (ref 0.00–0.40)
Total Protein: 7.4 g/dL (ref 6.0–8.5)

## 2022-09-14 MED ORDER — VEOZAH 45 MG PO TABS
45.0000 mg | ORAL_TABLET | Freq: Every day | ORAL | 0 refills | Status: DC
Start: 2022-09-14 — End: 2022-10-05
  Filled 2022-09-14 – 2022-10-05 (×2): qty 30, 30d supply, fill #0

## 2022-09-15 ENCOUNTER — Other Ambulatory Visit: Payer: Self-pay

## 2022-09-15 DIAGNOSIS — Z419 Encounter for procedure for purposes other than remedying health state, unspecified: Secondary | ICD-10-CM | POA: Diagnosis not present

## 2022-09-17 ENCOUNTER — Other Ambulatory Visit (HOSPITAL_COMMUNITY): Payer: Self-pay

## 2022-09-17 ENCOUNTER — Other Ambulatory Visit (HOSPITAL_COMMUNITY): Payer: Self-pay | Admitting: Internal Medicine

## 2022-09-17 MED ORDER — DIGOXIN 125 MCG PO TABS
0.1250 mg | ORAL_TABLET | Freq: Every day | ORAL | 3 refills | Status: DC
  Filled 2022-09-17: qty 90, 90d supply, fill #0

## 2022-09-24 ENCOUNTER — Other Ambulatory Visit (HOSPITAL_COMMUNITY): Payer: Self-pay | Admitting: Internal Medicine

## 2022-09-25 ENCOUNTER — Other Ambulatory Visit: Payer: Self-pay

## 2022-09-25 ENCOUNTER — Other Ambulatory Visit (HOSPITAL_COMMUNITY): Payer: Self-pay

## 2022-09-25 MED ORDER — POTASSIUM CHLORIDE ER 10 MEQ PO TBCR
100.0000 meq | EXTENDED_RELEASE_TABLET | Freq: Two times a day (BID) | ORAL | 2 refills | Status: DC
  Filled 2022-09-25: qty 600, 30d supply, fill #0
  Filled 2022-10-22: qty 600, 30d supply, fill #1

## 2022-10-01 ENCOUNTER — Telehealth (HOSPITAL_COMMUNITY): Payer: Self-pay | Admitting: Licensed Clinical Social Worker

## 2022-10-01 ENCOUNTER — Telehealth: Payer: Self-pay

## 2022-10-01 DIAGNOSIS — G4733 Obstructive sleep apnea (adult) (pediatric): Secondary | ICD-10-CM | POA: Diagnosis not present

## 2022-10-01 NOTE — Telephone Encounter (Signed)
Copied from CRM [id number]. Topic: General - Other >> Oct 01, 2022  9:39 AM Dondra Prader A wrote: Reason for CRM: Pt states that she is on food stamps and it is time for her re certification and she is needing a letter from her PCP stating that she is unemployable. Pt states the she did not work enough and cannot get Social Security benefits nor can she get disability. Pt states that she is on medicaid. Pt will need this letter by Friday 10/05/2022, she has to turn it back in to the Department of Social Service for her food stamps. Pt states that she is living with family and she need her food stamps to be able to feed herself, without them she is not able to eat. Please advise and call pt back.

## 2022-10-01 NOTE — Telephone Encounter (Signed)
H&V Care Navigation CSW Progress Note  Clinical Social Worker  received a call from pt reported she needed assistance with a letter from the MD stating that she could not work due to her health.  CSW discussed with MD who is agreeable.    Letter sent to [facility] for review.  Copy of letter and list of local food pantries sent to patient.   Patient is participating in a Managed Medicaid Plan:  Yes  SDOH Screenings   Food Insecurity: Food Insecurity Present (09/12/2022)  Housing: Low Risk  (09/12/2022)  Transportation Needs: No Transportation Needs (09/12/2022)  Alcohol Screen: Low Risk  (09/12/2022)  Depression (PHQ2-9): High Risk (09/13/2022)  Financial Resource Strain: Medium Risk (09/12/2022)  Physical Activity: Insufficiently Active (09/12/2022)  Social Connections: Moderately Isolated (09/12/2022)  Stress: No Stress Concern Present (09/12/2022)  Tobacco Use: Medium Risk (09/13/2022)     Burna Sis, LCSW Clinical Social Worker [facility] Desk#: [phone] Cell#: [phone]

## 2022-10-02 ENCOUNTER — Ambulatory Visit: Payer: Medicaid Other | Admitting: Gastroenterology

## 2022-10-03 ENCOUNTER — Other Ambulatory Visit (HOSPITAL_COMMUNITY): Payer: Self-pay

## 2022-10-03 NOTE — Telephone Encounter (Signed)
This will have to wait for Dr Henriette Combs return. I am not familiar with this patient and she does not have a previous letter on file that I can review for this.

## 2022-10-04 ENCOUNTER — Ambulatory Visit (INDEPENDENT_AMBULATORY_CARE_PROVIDER_SITE_OTHER): Payer: Medicaid Other | Admitting: Gastroenterology

## 2022-10-04 ENCOUNTER — Encounter: Payer: Self-pay | Admitting: Gastroenterology

## 2022-10-04 VITALS — BP 102/68 | HR 81 | Ht 70.0 in | Wt 178.0 lb

## 2022-10-04 DIAGNOSIS — I5042 Chronic combined systolic (congestive) and diastolic (congestive) heart failure: Secondary | ICD-10-CM | POA: Diagnosis not present

## 2022-10-04 DIAGNOSIS — K56699 Other intestinal obstruction unspecified as to partial versus complete obstruction: Secondary | ICD-10-CM

## 2022-10-04 DIAGNOSIS — K8681 Exocrine pancreatic insufficiency: Secondary | ICD-10-CM

## 2022-10-04 DIAGNOSIS — Z23 Encounter for immunization: Secondary | ICD-10-CM | POA: Diagnosis not present

## 2022-10-04 DIAGNOSIS — K50119 Crohn's disease of large intestine with unspecified complications: Secondary | ICD-10-CM | POA: Diagnosis not present

## 2022-10-04 DIAGNOSIS — I7300 Raynaud's syndrome without gangrene: Secondary | ICD-10-CM | POA: Diagnosis not present

## 2022-10-04 DIAGNOSIS — I2510 Atherosclerotic heart disease of native coronary artery without angina pectoris: Secondary | ICD-10-CM

## 2022-10-04 NOTE — Patient Instructions (Addendum)
We have given you Hepatitis B Vaccine today. 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 You are due for your labs in October. Follow up in 6 to 12 months.  _______________________________________________________  If your blood pressure at your visit was 140/90 or greater, please contact your primary care physician to follow up on this.  _______________________________________________________   If you are age 55 or younger, your body mass index should be between 19-25. Your Body mass index is 25.54 kg/m. If this is out of the aformentioned range listed, please consider follow up with your Primary Care Provider.   __________________________________________________________  The  GI providers would like to encourage you to use [facility] to communicate with providers for non-urgent requests or questions.  Due to long hold times on the telephone, sending your provider a message by [facility] may be a faster and more efficient way to get a response.  Please allow 48 business hours for a response.  Please remember that this is for non-urgent requests.

## 2022-10-04 NOTE — Progress Notes (Signed)
Chief Complaint:    Crohn's disease  GI History: [name] is a 57 year old female with HFrEF (LVEF <20%) and CAD c/b LAD occlusion s/p ICD, chronic tobacco use,OSA on CPAP, history of LV thrombus on eliquis, and ADHD, anxiety/depression, and PTSD follows in the GI clinic for the following:   1) Crohn's Disease (stricturing ileitis).  Started with watery diarrhea in 08/2021.  Index symptoms of 8-10 loose, watery stools with fecal urgency.  No nocturnal symptoms.  Loss 6# with decreased p.o. intake. - 11/28/2021: ER evaluation for diarrhea.  WBC 13.3, ALT 56, ALP 127, otherwise unremarkable work-up.  Was given Keflex to treat UTI - 11/29/2021: Negative/normal GI PCR panel, O&P - 12/14/2021: Initial appointment in the GI clinic - 12/21/2021: CT A/P: Moderate colonic stool burden but otherwise no intra-abdominal pathology - 02/26/2022: EGD: Normal - 02/26/2022: Colonoscopy: 3 mm sigmoid HP, sigmoid diverticulosis, otherwise normal colon (path benign).  Terminal ileum with moderate stenosis measuring 1 cm in length by 5 mm in diameter which was not traversable.  This was located 3 cm from the ileocecal valve.  Terminal ileum contained a few ulcers proximal and distal to stricture (path: Acute ileitis with ulceration) - 03/01/2022: CT: No acute intra-abdominal pathology.  Stable 3.1 cm infrarenal abdominal aortic aneurysm.  Submucosal fatty infiltration in the terminal ileum with superimposed mucosal hyperemia suspicious for mild, acute on chronic terminal ileitis - 03/02/2022: CT enterography: Ileitis throughout a 15 cm segment of ileum with 2 segments of luminal narrowing/stricturing through this segment of distal ileum, c/w stricturing, ileal Crohn's Disease.  Negative/normal lactoferrin, calprotectin, repeat GI PCR panel. Fecal pancreatic elastase <50.  Was started on Entocort 9 mg/day x 8 weeks - 03/13/2022: GI follow-up appointment.  Reports overall decreased stool frequency with Entocort.  ESR 52,  normal CRP. - 07/04/2022: GI follow-up.  Was doing well, but return of diarrhea couple days later.  Restarted budesonide with good clinical response - 07/18/2022: CT enterography: Mucosal hyperenhancement and 2 short segment strictures in the TI, unchanged from previous.  Mild distal small bowel partial obstruction without penetrating disease, fistula, abscess - 07/20/2022: GI follow-up appointment.  Calprotectin 294.  Started on [place].  C. difficile positive.  Treated with vancomycin  HPI:     Patient is a 57 y.o. female presenting to the Gastroenterology Clinic for follow-up.  Was last seen in the GI clinic by Christine [name] on 07/20/2022 and was started on Entyvio.  Last dose was 09/13/2022.  Still having pruritic, blistering skin rash which has not changed since starting Entyvio.  No change with previous prednisone trial. Has appt with Dermatology in a couple weeks.   Diarrhea much improved since starting the Entyvio (and previously completing Vancomycin for CDI). Can still have variable bowel habits, but overall feeling much better.   Still taking 1 Creon with meals. If she stops, the diarrhea recurs.   Requesting referral to Rheumatology for possible Raynaud's.   Review of systems:     No chest pain, no SOB, no fevers, no urinary sx   Past Medical History:  Diagnosis Date   Allergy    Anxiety 01/20/2021   CAD in native artery 08/24/2021   CHF (congestive heart failure) (HCC)    Chronic combined systolic and diastolic heart failure (HCC) 08/24/2021   Complication of anesthesia    Depression    Hyperlipidemia    Myocardial infarction [facility])    Presence of cardiac defibrillator 07/2021   Shortness of breath 01/20/2021   Sleep apnea 07/2021  Tobacco abuse 01/20/2021    Patient's surgical history, family medical history, social history, medications and allergies were all reviewed in Epic    Current Outpatient Medications  Medication Sig Dispense Refill   acetaminophen (TYLENOL)  500 MG tablet Take 1,000 mg by mouth 3 (three) times daily as needed for moderate pain or headache.     ALPRAZolam (XANAX) 1 MG tablet Take 2 mg by mouth 3 (three) times daily.     alum & mag hydroxide-simeth (MAALOX/MYLANTA) 200-200-20 MG/5ML suspension Take 30 mLs by mouth every 4 (four) hours as needed for indigestion or heartburn. 355 mL 0   apixaban (ELIQUIS) 5 MG TABS tablet Take 1 tablet (5 mg total) by mouth 2 (two) times daily. 60 tablet 4   atorvastatin (LIPITOR) 80 MG tablet Take 1 tablet (80 mg total) by mouth daily. 90 tablet 3   buPROPion (WELLBUTRIN XL) 150 MG 24 hr tablet Take 150 mg by mouth daily.     cyclobenzaprine (FLEXERIL) 10 MG tablet Take 20 mg by mouth at bedtime.     dapagliflozin propanediol (FARXIGA) 10 MG TABS tablet Take 1 tablet (10 mg total) by mouth daily. 30 tablet 11   digoxin (LANOXIN) 0.125 MG tablet Take 1 tablet (0.125 mg total) by mouth daily. 90 tablet 3   diphenhydrAMINE (BENADRYL) 25 MG tablet Take 50 mg by mouth as needed for allergies.     diphenoxylate-atropine (LOMOTIL) 2.5-0.025 MG tablet Take 2 tablets by mouth 4 (four) times daily as needed for diarrhea/loose stools. 60 tablet 3   feeding supplement (ENSURE ENLIVE / ENSURE PLUS) LIQD Take 237 mLs by mouth 2 (two) times daily between meals. 237 mL 12   Fezolinetant (VEOZAH) 45 MG TABS Take 1 tablet (45 mg total) by mouth daily. (Patient not taking: Reported on 10/04/2022) 30 tablet 0   furosemide (LASIX) 40 MG tablet Take 2 tablets (80 mg total) by mouth in the morning and every evening 120 tablet 3   ivabradine (CORLANOR) 5 MG TABS tablet Take 1 tablet (5 mg total) by mouth 2 (two) times daily. 60 tablet 6   lipase/protease/amylase (CREON) 36000 UNITS CPEP capsule Take 2 capsules ( 72000 units) with meals and 1 capsule ( 36000 units) with snack. (Patient taking differently: 36,000 Units with breakfast, with lunch, and with evening meal. Take 1 tablet before breakfast) 300 capsule 5   metolazone  (ZAROXOLYN) 2.5 MG tablet Take 1 tablet (2.5 mg total) by mouth daily. (Patient taking differently: Take 2.5 mg by mouth as needed.) 3 tablet 0   metoprolol succinate (TOPROL-XL) 25 MG 24 hr tablet Take 1 tablet (25 mg total) by mouth daily. Take with or immediately following a meal. 30 tablet 11   metroNIDAZOLE (METROGEL) 1 % gel Apply topically as needed.     Multiple Vitamins-Minerals (MULTIVITAMIN WITH MINERALS) tablet Take 1 tablet by mouth daily.     oxymetazoline (AFRIN) 0.05 % nasal spray Place 1-2 sprays into both nostrils 2 (two) times daily as needed for congestion.     potassium chloride (KLOR-CON) 10 MEQ tablet Take 10 tablets (100 mEq total) by mouth 2 (two) times daily. 600 tablet 2   promethazine (PHENERGAN) 25 MG tablet Take 1 tablet (25 mg total) by mouth every 6 (six) to 8 (eight) hours for nausea or vomiting. 90 tablet 2   spironolactone (ALDACTONE) 25 MG tablet Take 1 tablet (25 mg total) by mouth daily. 45 tablet 3   zolpidem (AMBIEN) 10 MG tablet Take 5-10 mg by mouth at  bedtime.     No current facility-administered medications for this visit.    Physical Exam:     BP 102/68   Pulse 81   Ht 5\' 10"  (1.778 m)   Wt 178 lb (80.7 kg)   BMI 25.54 kg/m   GENERAL:  Pleasant female in NAD PSYCH: : Cooperative, normal affect SKIN: Several small pustular and blistering lesions scattered across bilateral legs and forearms.  Not raised, erythematous, edematous lesions as typically seen with erythema nodosum.   Musculoskeletal:  Normal muscle tone, normal strength NEURO: Alert and oriented x 3, no focal neurologic deficits   IMPRESSION and PLAN:    1) Crohn's Disease 2) Ileitis 57 year old female with stricturing ileal Crohn's Disease with good clinical response to Entyvio. - Resume Entyvio as scheduled - Start hepatitis B vaccine series today - Keep appointment with Dermatology and will need at least annual screening - Check fecal calprotecitn along with vedolizumab  trough and Ab level at 6 months (check 1-2 weeks prior to October dose) - Colonoscopy in 6-12 months  3) Skin rash Several small pustular and blistering type lesions scattered across bilateral legs and forearms.  It does not appear to be consistent with erythema nodosum - Keep appoint with Dermatology  4) CHF 5) CAD - Continue close follow-up with Dr. Gala Romney - No anti-TNF  6) EPI As previously discussed, very unclear if she truly has EPI as the fecal elastase was very low.  Nonetheless, has had a good response to Creon - Continue Creon for the time being  7) Foot pain - Per patient request, referral to Rheumatology to evaluate for possible Raynaud's  RTC in 6-12 months or sooner prn     I spent 30 minutes of time, including in depth chart review, independent review of results as outlined above, communicating results with the patient directly, face-to-face time with the patient, coordinating care, and ordering studies and medications as appropriate, and documentation.      Christine [name] ,DO, FACG 10/04/2022, 1:54 PM

## 2022-10-05 ENCOUNTER — Other Ambulatory Visit: Payer: Self-pay

## 2022-10-05 ENCOUNTER — Other Ambulatory Visit: Payer: Self-pay | Admitting: Pharmacist

## 2022-10-05 DIAGNOSIS — K50118 Crohn's disease of large intestine with other complication: Secondary | ICD-10-CM

## 2022-10-05 DIAGNOSIS — R232 Flushing: Secondary | ICD-10-CM

## 2022-10-05 MED ORDER — VEOZAH 45 MG PO TABS
45.0000 mg | ORAL_TABLET | Freq: Every day | ORAL | 0 refills | Status: DC
Start: 2022-10-05 — End: 2022-11-08
  Filled 2022-10-05 – 2022-10-08 (×2): qty 30, 30d supply, fill #0

## 2022-10-08 ENCOUNTER — Other Ambulatory Visit: Payer: Self-pay

## 2022-10-08 ENCOUNTER — Other Ambulatory Visit (HOSPITAL_COMMUNITY): Payer: Self-pay

## 2022-10-08 NOTE — Progress Notes (Signed)
Advanced [facility] Failure Clinic Note   PCP: Marcine Matar, MD Cardiology: Dr. Duke Salvia HF Cardiologist: Dr. Gala Romney  HPI: Christine [name] is a 57 y.o.female with history of chronic tobacco use, ADHD, anxiety, depression, CAD, systolic HF   Presented to ED [birth year] with increased shortness of breath/tachycardia. Adderrall stopped. Echo with EF 20-25%,  LHC/RHC with single vessel CAD (occluded mLAD) elevated filling pressures and  moderately reduced CO. Digoxin added. Beta blocker stopped.  Discharged to home 01/25/21. Discharge weight 213 pounds.   Echo 05/11/21 EF < 20% LV severely dilated RV ok Mild MR. Referred for ICD.  CPX with very mild HF limitation with elevated Ve/VCO2 slope  S/p [place] SCI ICD 4/23.  Saw PCP 08/10/21 for dizziness and anxiety. Felt insomnia related, labs unremarkable.  Seen in ED 08/18/21 with sudden onset SOB. CXR and EKG re-assuring, Trop x 2 normal. BNP mildly up, advised admission to further work up. She declined and was elected to discharge home.  Seen in ED 10/06/21 for urinary retention, required foley. Felt to be 2/2 to prazosin and this was stopped.  Admitted (03/01/22) with CP and chronic diarrhea. Hstrop negative. Felt to have Crohn's disease. Losartan and spiro stopped due to low BP.  Following with GI (Dr. Bethann Berkshire)  Multiple ED visits for various issues.   Monitor in 12/23: Rare PVCs 2 episodes of brief SVT (4 beats)   Follow up 3/24, reported NYHA III symptoms but objective findings stable. Repeat echo (4/24) showed improved EF 35-40%, garde 1 DD, RV ok  Today she returns for HF follow up. Overall feeling fine. Main complaints are GI issues related to Crohn's disease. She has gained 5.5-6 lbs over last week, asking for refills on metolazone. Uses metolazone every 2-3 weeks. She has SOB walking further distances on flat ground, has to take her time walking up steps. She continues with palpitations in the mornings. She has dizziness in the  afternoons, resolves with rest. Denies CP, abnormal bleeding, edema, or PND/Orthopnea; she sleeps on a wedge. Appetite ok. No fever or chills. Weight at home 176-177 pounds. Taking all medications. BP at home 120s/70s.  Cardiac Testing   - Echo (4/24): EF 35-40%, garde 1 DD, RV ok  - Echo (05/11/21): EF < 20% LV severely dilated RV ok Mild MR   - Echo (10/22): EF 20-25% LV severely dilated, LV apical mural thrombus, RV okay, moderate MR.  - R/LHC (10/22): w/ severe 1v CAD with occlusion of mid LAD., PCW 27, CO 4.7, CI 2.2    - cMRI (10/22): demonstrated subendocardial LGE consistent with prior infarcts in LV basal inferolateral wall, apical anterior/septal/inferior walls and apex. LVEF 22%.  - CPX 3/23: FVC 3.60 (85%)      FEV1 2.89 (87%)        FEV1/FVC 80 (100%)        MVV 171 (157%)   Resting HR: 113 Standing HR: 113 Peak HR: 149   (89 % age predicted max HR)  BP rest: 98/60 Standing BP: 90/62 BP peak: 134/58  Peak VO2: 18.8 (90% predicted peak VO2)  VE/VCO2 slope:  33 Peak RER: 1.14  VE/MVV:  38% O2pulse:  11   (100 % predicted O2pulse)   ROS: All systems negative except as listed in HPI, PMH and Problem List.  SH:  Social History   Socioeconomic History   Marital status: Single    Spouse name: Not on file   Number of children: 0   Years of education: 71  Highest education level: Bachelor's degree (e.g., BA, AB, BS)  Occupational History   Not on file  Tobacco Use   Smoking status: Former    Packs/day: .5    Types: Cigarettes    Quit date: 01/15/2021    Years since quitting: 1.7   Smokeless tobacco: Never  Vaping Use   Vaping Use: Never used  Substance and Sexual Activity   Alcohol use: Yes    Comment: rarely   Drug use: Never   Sexual activity: Not on file  Other Topics Concern   Not on file  Social History Narrative   Not on file   Social Determinants of Health   Financial Resource Strain: Medium Risk (09/12/2022)   Overall Financial Resource Strain  (CARDIA)    Difficulty of Paying Living Expenses: Somewhat hard  Food Insecurity: Food Insecurity Present (09/12/2022)   Hunger Vital Sign    Worried About Running Out of Food in the Last Year: Sometimes true    Ran Out of Food in the Last Year: Patient declined  Transportation Needs: No Transportation Needs (09/12/2022)   PRAPARE - Administrator, Civil Service (Medical): No    Lack of Transportation (Non-Medical): No  Physical Activity: Insufficiently Active (09/12/2022)   Exercise Vital Sign    Days of Exercise per Week: 4 days    Minutes of Exercise per Session: 20 min  Stress: No Stress Concern Present (09/12/2022)   [employer] of Occupational Health - Occupational Stress Questionnaire    Feeling of Stress : Only a little  Social Connections: Moderately Isolated (09/12/2022)   Social Connection and Isolation Panel [NHANES]    Frequency of Communication with Friends and Family: Twice a week    Frequency of Social Gatherings with Friends and Family: Once a week    Attends Religious Services: 1 to 4 times per year    Active Member of [employer] or Organizations: No    Attends Engineer, structural: Not on file    Marital Status: Never married  Catering manager Violence: Not on file   FH:  Family History  Problem Relation Age of Onset   Stroke Mother    Atrial fibrillation Mother    Heart failure Father    Stomach cancer Maternal Grandfather    Liver cancer Neg Hx    Esophageal cancer Neg Hx    Colon polyps Neg Hx    Colon cancer Neg Hx    Past Medical History:  Diagnosis Date   Allergy    Anxiety 01/20/2021   CAD in native artery 08/24/2021   CHF (congestive heart failure) (HCC)    Chronic combined systolic and diastolic heart failure (HCC) 08/24/2021   Complication of anesthesia    Depression    Hyperlipidemia    Myocardial infarction [facility])    Presence of cardiac defibrillator 07/2021   Shortness of breath 01/20/2021   Sleep apnea 07/2021    Tobacco abuse 01/20/2021   Current Outpatient Medications  Medication Sig Dispense Refill   acetaminophen (TYLENOL) 500 MG tablet Take 1,000 mg by mouth 3 (three) times daily as needed for moderate pain or headache.     ALPRAZolam (XANAX) 1 MG tablet Take 2 mg by mouth 3 (three) times daily.     alum & mag hydroxide-simeth (MAALOX/MYLANTA) 200-200-20 MG/5ML suspension Take 30 mLs by mouth every 4 (four) hours as needed for indigestion or heartburn. 355 mL 0   apixaban (ELIQUIS) 5 MG TABS tablet Take 1 tablet (5 mg total) by  mouth 2 (two) times daily. 60 tablet 4   atorvastatin (LIPITOR) 80 MG tablet Take 1 tablet (80 mg total) by mouth daily. 90 tablet 3   buPROPion (WELLBUTRIN XL) 150 MG 24 hr tablet Take 150 mg by mouth daily.     cyclobenzaprine (FLEXERIL) 10 MG tablet Take 20 mg by mouth at bedtime.     dapagliflozin propanediol (FARXIGA) 10 MG TABS tablet Take 1 tablet (10 mg total) by mouth daily. 30 tablet 11   digoxin (LANOXIN) 0.125 MG tablet Take 1 tablet (0.125 mg total) by mouth daily. 90 tablet 3   diphenhydrAMINE (BENADRYL) 25 MG tablet Take 50 mg by mouth as needed for allergies.     diphenoxylate-atropine (LOMOTIL) 2.5-0.025 MG tablet Take 2 tablets by mouth 4 (four) times daily as needed for diarrhea/loose stools. 60 tablet 3   feeding supplement (ENSURE ENLIVE / ENSURE PLUS) LIQD Take 237 mLs by mouth 2 (two) times daily between meals. 237 mL 12   Fezolinetant (VEOZAH) 45 MG TABS Take 1 tablet (45 mg total) by mouth daily. 30 tablet 0   furosemide (LASIX) 40 MG tablet Take 2 tablets (80 mg total) by mouth in the morning and every evening 120 tablet 3   ivabradine (CORLANOR) 5 MG TABS tablet Take 1 tablet (5 mg total) by mouth 2 (two) times daily. 60 tablet 6   lipase/protease/amylase (CREON) 36000 UNITS CPEP capsule Take 2 capsules ( 72000 units) with meals and 1 capsule ( 36000 units) with snack. (Patient taking differently: 36,000 Units with breakfast, with lunch, and with  evening meal. Take 1 tablet before breakfast) 300 capsule 5   metolazone (ZAROXOLYN) 2.5 MG tablet Take 1 tablet (2.5 mg total) by mouth daily. (Patient taking differently: Take 2.5 mg by mouth as needed.) 3 tablet 0   metoprolol succinate (TOPROL-XL) 25 MG 24 hr tablet Take 1 tablet (25 mg total) by mouth daily. Take with or immediately following a meal. 30 tablet 11   metroNIDAZOLE (METROGEL) 1 % gel Apply topically as needed.     Multiple Vitamins-Minerals (MULTIVITAMIN WITH MINERALS) tablet Take 1 tablet by mouth daily.     oxymetazoline (AFRIN) 0.05 % nasal spray Place 1-2 sprays into both nostrils 2 (two) times daily as needed for congestion.     potassium chloride (KLOR-CON) 10 MEQ tablet Take 10 tablets (100 mEq total) by mouth 2 (two) times daily. 600 tablet 2   promethazine (PHENERGAN) 25 MG tablet Take 1 tablet (25 mg total) by mouth every 6 (six) to 8 (eight) hours for nausea or vomiting. 90 tablet 2   spironolactone (ALDACTONE) 25 MG tablet Take 1 tablet (25 mg total) by mouth daily. 45 tablet 3   zolpidem (AMBIEN) 10 MG tablet Take 5-10 mg by mouth at bedtime.     No current facility-administered medications for this encounter.   BP 106/60 (BP Location: Left Arm, Patient Position: Sitting)   Pulse 69   Wt 81.9 kg (180 lb 9.6 oz)   SpO2 96%   BMI 25.91 kg/m   Wt Readings from Last 3 Encounters:  10/09/22 81.9 kg (180 lb 9.6 oz)  10/04/22 80.7 kg (178 lb)  09/13/22 80.3 kg (177 lb)   PHYSICAL EXAM: General:  NAD. No resp difficulty, walked into clinic HEENT: Normal Neck: Supple. No JVD. Carotids 2+ bilat; no bruits. No lymphadenopathy or thryomegaly appreciated. Cor: PMI nondisplaced. Regular rate & rhythm. No rubs, gallops or murmurs. Lungs: Clear Abdomen: Soft, nontender, nondistended. No hepatosplenomegaly. No bruits or masses.  Good bowel sounds. Extremities: No cyanosis, clubbing, rash, edema Neuro: Alert & oriented x 3, cranial nerves grossly intact. Moves all 4  extremities w/o difficulty. Affect pleasant.  ICD interrogation (personally reviewed): HL Score 1, fluid ok, average HR 69 bpm, no VT. Activity level 1.0 hr/day.  ECG (personally reviewed): NSR 68 bpm, no ectopy  ASSESSMENT & PLAN: 1. 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 Echo EF 20-25%, severely dilated LV, RV okay, moderate MR - R/LHC (10/22): 1v CAD occluded mid LAD PCWP 27,  Fick 4.7/2.  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 No viability. RV okay. Not sure how to explain inferior defects on cMRI  - Echo (05/11/21) EF < 20% RV ok - CPX 1/23 with very mild HF limitation:  Peak VO2: 18.8 (90% predicted peak VO2) VE/VCO2 slope: 33 Peak RER: 1.14  - s/p [employer] ICD 4/23. - Echo (4/24): EF 35-40%, RV ok - Reports NYHA II-early III symptoms, but objective findings are stable. Volume stable on exam and by device interrogation. - With improving EF, stop digoxin. - Continue Lasix 80 mg bid + 100 KCL bid - Continue metolazone 2.5 mg/40 KCL PRN (uses every 2-3 weeks), refill today. - Continue Ivabradine 5 mg bid.  - Continue Toprol XL 25 mg daily. - Continue spiro 25 mg daily. - Continue Farxiga 10 mg daily.  - Off losartan due to low BP. BP too low to re-challenge - Labs today.   2. Palpitations - No longer on Adderall. - Zio 14-day (11/22) showed mostly SR, no high-grade arrhythmias. - Monitor 12/23 SR. Two brief runs SVT (4 beats)  - ECG stable today.  3. CAD - Single vessel LAD occlusion on LHC. - No s/s ischemia - Continue statin. - No ASA with Eliquis.   4. Tobacco use - Remains quit from cigarettes.  5. LV apical thrombus - Noted on echo/cMRI.  - none on most recent echo 4/24. - On Eliquis. No bleeding issues.   6. Diarrhea  - Following with GI. Felt to have Crohn's disease  7. OSA - Mild on sleep study, AHI 11.8 - Follows with Dr.  Mayford Knife, referred to sleep dentistry to discuss oral device.  8. Polycythemia - Follows with Heme/Onc. - JAK negative  9. AAA - CT scan 11/23  3.1cm - Follow u/s in 3 years recommended (02/2025)  10. Hypokalemia - Recent K 3.9 - She in on KCL 100 bid - BMET today.  Follow up in 6 months with Dr. Mickle Plumb FNP-BC 11:44 AM

## 2022-10-08 NOTE — Telephone Encounter (Signed)
Called & spoke to the patient. Verified name & DOB. Informed that letter is ready for pick-up. Patient stated that she was able to get the letter from a different doctor. Patient has no further questions at this time.

## 2022-10-09 ENCOUNTER — Other Ambulatory Visit (HOSPITAL_COMMUNITY): Payer: Self-pay

## 2022-10-09 ENCOUNTER — Other Ambulatory Visit: Payer: Self-pay

## 2022-10-09 ENCOUNTER — Ambulatory Visit (HOSPITAL_COMMUNITY)
Admission: RE | Admit: 2022-10-09 | Discharge: 2022-10-09 | Disposition: A | Discharge status: Home / Self Care | Payer: Medicaid Other | Source: Ambulatory Visit | Attending: Family Medicine | Admitting: Family Medicine

## 2022-10-09 ENCOUNTER — Encounter (HOSPITAL_COMMUNITY): Payer: Self-pay

## 2022-10-09 VITALS — BP 106/60 | HR 69 | Wt 180.6 lb

## 2022-10-09 DIAGNOSIS — K5090 Crohn's disease, unspecified, without complications: Secondary | ICD-10-CM | POA: Diagnosis not present

## 2022-10-09 DIAGNOSIS — I513 Intracardiac thrombosis, not elsewhere classified: Secondary | ICD-10-CM | POA: Diagnosis not present

## 2022-10-09 DIAGNOSIS — I5022 Chronic systolic (congestive) heart failure: Secondary | ICD-10-CM

## 2022-10-09 DIAGNOSIS — Z87891 Personal history of nicotine dependence: Secondary | ICD-10-CM | POA: Diagnosis not present

## 2022-10-09 DIAGNOSIS — Z5986 Financial insecurity: Secondary | ICD-10-CM | POA: Diagnosis not present

## 2022-10-09 DIAGNOSIS — D751 Secondary polycythemia: Secondary | ICD-10-CM

## 2022-10-09 DIAGNOSIS — I714 Abdominal aortic aneurysm, without rupture, unspecified: Secondary | ICD-10-CM

## 2022-10-09 DIAGNOSIS — I2510 Atherosclerotic heart disease of native coronary artery without angina pectoris: Secondary | ICD-10-CM

## 2022-10-09 DIAGNOSIS — I255 Ischemic cardiomyopathy: Secondary | ICD-10-CM | POA: Insufficient documentation

## 2022-10-09 DIAGNOSIS — Z7901 Long term (current) use of anticoagulants: Secondary | ICD-10-CM | POA: Diagnosis not present

## 2022-10-09 DIAGNOSIS — G4733 Obstructive sleep apnea (adult) (pediatric): Secondary | ICD-10-CM

## 2022-10-09 DIAGNOSIS — R002 Palpitations: Secondary | ICD-10-CM

## 2022-10-09 DIAGNOSIS — E876 Hypokalemia: Secondary | ICD-10-CM | POA: Diagnosis not present

## 2022-10-09 DIAGNOSIS — Z8249 Family history of ischemic heart disease and other diseases of the circulatory system: Secondary | ICD-10-CM | POA: Diagnosis not present

## 2022-10-09 DIAGNOSIS — Z79899 Other long term (current) drug therapy: Secondary | ICD-10-CM | POA: Insufficient documentation

## 2022-10-09 LAB — BASIC METABOLIC PANEL
Anion gap: 9 (ref 5–15)
BUN: 15 mg/dL (ref 6–20)
CO2: 28 mmol/L (ref 22–32)
Calcium: 9.3 mg/dL (ref 8.9–10.3)
Chloride: 98 mmol/L (ref 98–111)
Creatinine, Ser: 0.96 mg/dL (ref 0.44–1.00)
GFR, Estimated: >60 mL/min (ref >60)
Glucose, Bld: 111 mg/dL — ABNORMAL HIGH (ref 70–99)
Potassium: 4.2 mmol/L (ref 3.5–5.1)
Sodium: 135 mmol/L (ref 135–145)

## 2022-10-09 MED ORDER — METOLAZONE 2.5 MG PO TABS
2.5000 mg | ORAL_TABLET | ORAL | 0 refills | Status: DC
  Filled 2022-10-09: qty 15, 15d supply, fill #0

## 2022-10-09 NOTE — Patient Instructions (Addendum)
Thank you for coming in today  EKG was done today  If you had labs drawn today, any labs that are abnormal the clinic will call you No news is good news  Medications: STOP Digoxin  Follow up appointments:  Your physician recommends that you schedule a follow-up appointment in:  6 months With Dr. Gala Romney You will receive a reminder letter in the mail a few months in advance. If you don't receive a letter, please call our office to schedule the follow-up appointment.    Do the following things EVERYDAY: Weigh yourself in the morning before breakfast. Write it down and keep it in a log. Take your medicines as prescribed Eat low salt foods--Limit salt (sodium) to 2000 mg per day.  Stay as active as you can everyday Limit all fluids for the day to less than 2 liters   At the [facility], you and your health needs are our priority. As part of our continuing mission to provide you with exceptional heart care, we have created designated Provider Care Teams. These Care Teams include your primary Cardiologist (physician) and Advanced Practice Providers (APPs- Physician Assistants and Nurse Practitioners) who all work together to provide you with the care you need, when you need it.   You may see any of the following providers on your designated Care Team at your next follow up: Dr Arvilla Meres Dr Marca Ancona Dr. Marcos Eke, NP Robbie Lis, [place] [facility] [place], [place] Brynda Peon, NP Karle Plumber, PharmD   Please be sure to bring in all your medications bottles to every appointment.    Thank you for choosing [facility]  If you have any questions or concerns before your next appointment please send [place] a message through [place] or call our office at [phone].    TO LEAVE A MESSAGE FOR THE NURSE SELECT OPTION 2, PLEASE LEAVE A MESSAGE INCLUDING: YOUR NAME DATE OF BIRTH CALL BACK  NUMBER REASON FOR CALL**this is important as we prioritize the call backs  YOU WILL RECEIVE A CALL BACK THE SAME DAY AS LONG AS YOU CALL BEFORE 4:00 PM

## 2022-10-10 ENCOUNTER — Ambulatory Visit: Payer: Medicaid Other | Admitting: Podiatry

## 2022-10-10 ENCOUNTER — Ambulatory Visit (INDEPENDENT_AMBULATORY_CARE_PROVIDER_SITE_OTHER): Payer: Medicaid Other

## 2022-10-10 ENCOUNTER — Other Ambulatory Visit: Payer: Self-pay | Admitting: Podiatry

## 2022-10-10 DIAGNOSIS — L600 Ingrowing nail: Secondary | ICD-10-CM

## 2022-10-10 DIAGNOSIS — R52 Pain, unspecified: Secondary | ICD-10-CM

## 2022-10-11 IMAGING — CR DG CHEST 1V
1 series · 1 of 1 positions shown · non-contrast
Comparison: Prior chest radiographs 07/21/2021 and earlier.

CLINICAL DATA: Provided history: Shortness of breath. Additional
history provided: Tachycardia, weakness.

EXAM:
CHEST  1 VIEW

[chest pa]
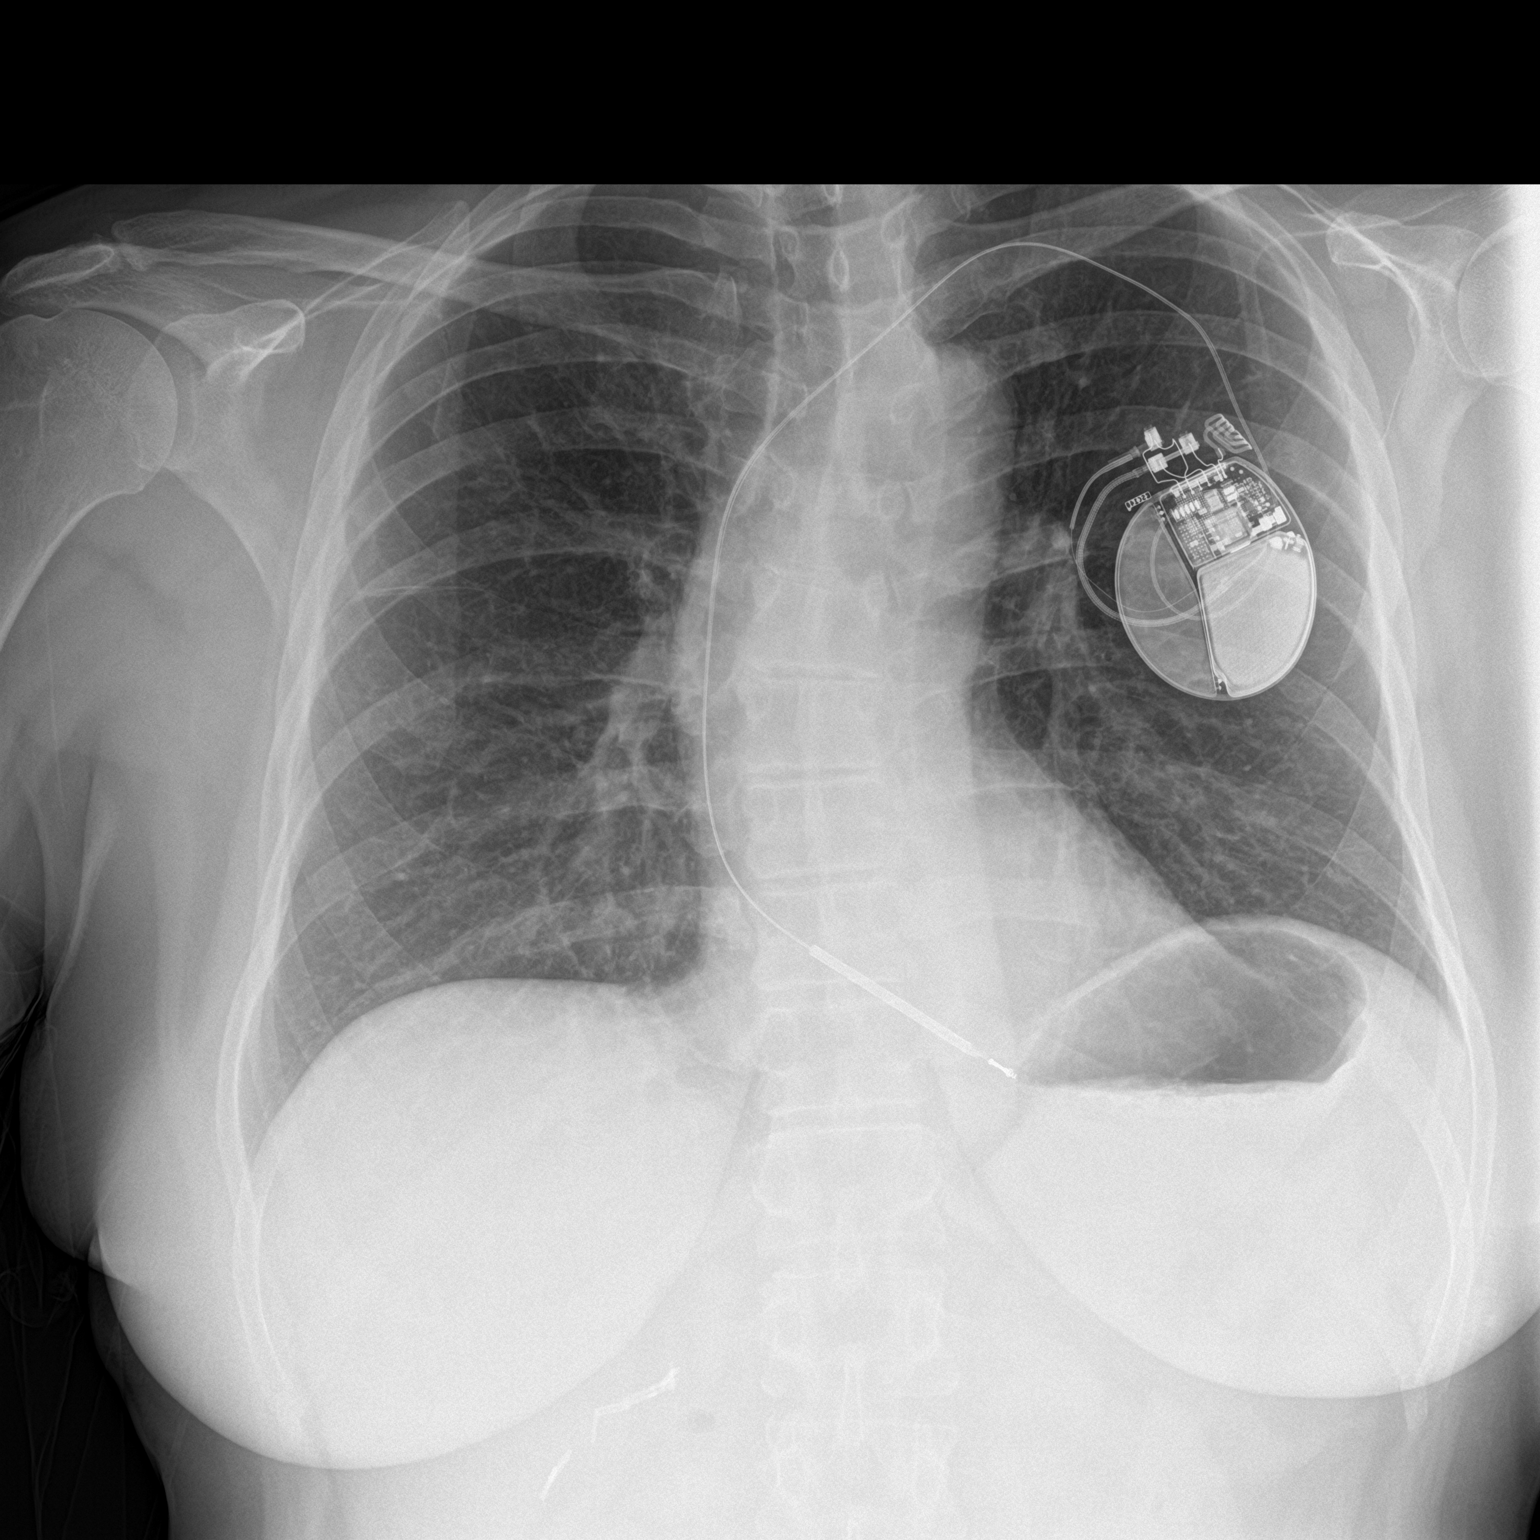

[1 of 1 positions shown; findings below may reference images not displayed]

FINDINGS: Redemonstrated left chest single lead AICD. Heart size within normal
limits. No appreciable airspace consolidation or pulmonary edema. No
evidence of pleural effusion or pneumothorax. No acute bony
abnormality identified. Dextrocurvature of the mid-to-lower thoracic
spine. Surgical clips within the right upper quadrant of the
abdomen.
IMPRESSION: No evidence of acute cardiopulmonary abnormality.

## 2022-10-11 NOTE — Progress Notes (Signed)
Subjective:   Patient ID: Christine [name], female   DOB: 57 y.o.   MRN: [id number]   HPI Patient states she has been developing a lot of throbbing in her right big toe and it started about 5 days ago.  States it is very sore and she did have an ingrown toenail fixed earlier this year   ROS      Objective:  Physical Exam  Neurovascular status intact muscle strength adequate patient noted to have scar tissue formation of the right hallux medial side with crusted tissue localized redness no proximal edema edema drainage noted     Assessment:  Paronychia infection of the right hallux medial side painful     Plan:  Reviewed condition and explained that there is probably a clearance of scar tissue with a low-grade localized infection.  I anesthetized the toe 60 mg like Marcaine mixture sterile prep done using sterile instrumentation removed the crusted tissue removed necrotic tissue flushed out the area found to be local applied sterile dressing instructed on soaks if any further redness pain or develop patient is to come back in immediately but this should heal uneventfully

## 2022-10-15 DIAGNOSIS — Z419 Encounter for procedure for purposes other than remedying health state, unspecified: Secondary | ICD-10-CM | POA: Diagnosis not present

## 2022-10-16 ENCOUNTER — Other Ambulatory Visit: Payer: Self-pay

## 2022-10-17 ENCOUNTER — Ambulatory Visit: Payer: Medicaid Other

## 2022-10-17 DIAGNOSIS — I5022 Chronic systolic (congestive) heart failure: Secondary | ICD-10-CM

## 2022-10-17 LAB — CUP PACEART REMOTE DEVICE CHECK
Battery Remaining Longevity: 168 mo
Battery Remaining Percentage: 100 %
Brady Statistic RV Percent Paced: 0 %
Date Time Interrogation Session: 20240703030500
HighPow Impedance: 91 Ohm
Implantable Lead Connection Status: 753985
Implantable Lead Implant Date: 20230407
Implantable Lead Location: 753860
Implantable Lead Model: 138
Implantable Lead Serial Number: 303166
Implantable Pulse Generator Implant Date: 20230407
Lead Channel Impedance Value: 925 Ohm
Lead Channel Pacing Threshold Amplitude: 0.8 V
Lead Channel Pacing Threshold Pulse Width: 0.4 ms
Lead Channel Setting Pacing Amplitude: 2.5 V
Lead Channel Setting Pacing Pulse Width: 0.4 ms
Lead Channel Setting Sensing Sensitivity: 0.5 mV
Pulse Gen Serial Number: 216478
Zone Setting Status: 755011

## 2022-10-20 DIAGNOSIS — L989 Disorder of the skin and subcutaneous tissue, unspecified: Secondary | ICD-10-CM | POA: Diagnosis not present

## 2022-10-22 ENCOUNTER — Other Ambulatory Visit (HOSPITAL_COMMUNITY): Payer: Self-pay | Admitting: Internal Medicine

## 2022-10-23 ENCOUNTER — Other Ambulatory Visit (HOSPITAL_COMMUNITY): Payer: Self-pay

## 2022-10-23 ENCOUNTER — Other Ambulatory Visit: Payer: Self-pay

## 2022-10-23 MED ORDER — DAPAGLIFLOZIN PROPANEDIOL 10 MG PO TABS
10.0000 mg | ORAL_TABLET | Freq: Every day | ORAL | 9 refills | Status: DC
  Filled 2022-10-23: qty 30, 30d supply, fill #0
  Filled 2022-11-10 – 2022-11-14 (×2): qty 30, 30d supply, fill #1
  Filled 2022-12-10 – 2022-12-14 (×2): qty 30, 30d supply, fill #2
  Filled 2023-01-14: qty 30, 30d supply, fill #3
  Filled 2023-02-09: qty 30, 30d supply, fill #4
  Filled 2023-03-11: qty 30, 30d supply, fill #5
  Filled 2023-04-12: qty 30, 30d supply, fill #6
  Filled 2023-05-11: qty 30, 30d supply, fill #7
  Filled 2023-06-10: qty 30, 30d supply, fill #8
  Filled 2023-07-12: qty 30, 30d supply, fill #9

## 2022-10-30 ENCOUNTER — Telehealth (HOSPITAL_COMMUNITY): Payer: Self-pay | Admitting: Cardiology

## 2022-10-30 ENCOUNTER — Ambulatory Visit: Payer: Self-pay

## 2022-10-30 NOTE — Telephone Encounter (Signed)
Will review further once updated transmission is available.

## 2022-10-30 NOTE — Telephone Encounter (Signed)
Chief Complaint: SOB Symptoms: SOB with activity and dizziness at times Frequency: comes and goes  Pertinent Negatives: Patient denies chest pain, fever, cough, Disposition: [] ED /[x] Urgent Care (no appt availability in office) / [] Appointment(In office/virtual)/ []  [facility]/ [] Home Care/ [] Refused Recommended Disposition /[] [facility]/ []  Follow-up with PCP Additional Notes: Patient reports SOB with activities. Patient states if she is moving around longer than 5 minutes she becomes short of breath and needs to rest for a moment. Patient reported symptoms to Cardiologist office and was told they do not believe the symptoms are heart related. Offered patient an appointment in the office today and patient declined stating she does not have transportation right now. Patient request an in person visit. Patient has been scheduled at [facility] tomorrow at 1300. Advised patient if SOB becomes worse to got to the ED patient verbalized understanding.  Reason for Disposition  [1] MILD difficulty breathing (e.g., minimal/no SOB at rest, SOB with walking, pulse <100) AND [2] NEW-onset or WORSE than normal  Answer Assessment - Initial Assessment Questions 1. RESPIRATORY STATUS: "Describe your breathing?" (e.g., wheezing, shortness of breath, unable to speak, severe coughing)      Shortness of breath 2. ONSET: "When did this breathing problem begin?"      Last Thursday 3. PATTERN "Does the difficult breathing come and go, or has it been constant since it started?"      Come and goes  4. SEVERITY: "How bad is your breathing?" (e.g., mild, moderate, severe)    - MILD: No SOB at rest, mild SOB with walking, speaks normally in sentences, can lie down, no retractions, pulse < 100.    - MODERATE: SOB at rest, SOB with minimal exertion and prefers to sit, cannot lie down flat, speaks in phrases, mild retractions, audible wheezing, pulse 100-120.    - SEVERE: Very SOB at rest, speaks in  single words, struggling to breathe, sitting hunched forward, retractions, pulse > 120      Mild 5. RECURRENT SYMPTOM: "Have you had difficulty breathing before?" If Yes, ask: "When was the last time?" and "What happened that time?"      Yes, years ago 6. CARDIAC HISTORY: "Do you have any history of heart disease?" (e.g., heart attack, angina, bypass surgery, angioplasty)      Heart failure  7. LUNG HISTORY: "Do you have any history of lung disease?"  (e.g., pulmonary embolus, asthma, emphysema)     No 8. CAUSE: "What do you think is causing the breathing problem?"      I'm not sure my Cardiologist does not think it is heart related  9. OTHER SYMPTOMS: "Do you have any other symptoms? (e.g., dizziness, runny nose, cough, chest pain, fever)     Dizziness at times  10. O2 SATURATION MONITOR:  "Do you use an oxygen saturation monitor (pulse oximeter) at home?" If Yes, ask: "What is your reading (oxygen level) today?" "What is your usual oxygen saturation reading?" (e.g., 95%)       No I don't.  Protocols used: Breathing Difficulty-A-AH

## 2022-10-30 NOTE — Telephone Encounter (Signed)
Patient called with concerns of increased SOB. Reports SOB is increased with activity  Denies weight increase Denies CP or HA Weight is reported as stable Denies swelling No recent medication changes No changes to diet or fluid intake  And reports great medication compliance   Patient is not established with pulmonology   HeartLogic number 0 as of 10/23/22 Pt to send updated transmission

## 2022-10-30 NOTE — Telephone Encounter (Signed)
Repeat heart logic number 0

## 2022-10-30 NOTE — Telephone Encounter (Signed)
pt aware and voiced understanding  

## 2022-10-31 ENCOUNTER — Telehealth (HOSPITAL_COMMUNITY): Payer: Self-pay | Admitting: Emergency Medicine

## 2022-10-31 ENCOUNTER — Other Ambulatory Visit (HOSPITAL_COMMUNITY): Payer: Self-pay

## 2022-10-31 ENCOUNTER — Other Ambulatory Visit: Payer: Self-pay

## 2022-10-31 ENCOUNTER — Emergency Department (HOSPITAL_BASED_OUTPATIENT_CLINIC_OR_DEPARTMENT_OTHER): Payer: Medicaid Other

## 2022-10-31 ENCOUNTER — Encounter (HOSPITAL_BASED_OUTPATIENT_CLINIC_OR_DEPARTMENT_OTHER): Payer: Self-pay

## 2022-10-31 ENCOUNTER — Ambulatory Visit (HOSPITAL_COMMUNITY)
Admission: RE | Admit: 2022-10-31 | Discharge: 2022-10-31 | Disposition: A | Discharge status: Home / Self Care | Payer: Medicaid Other | Source: Ambulatory Visit | Attending: Family Medicine | Admitting: Family Medicine

## 2022-10-31 ENCOUNTER — Encounter (HOSPITAL_COMMUNITY): Payer: Self-pay

## 2022-10-31 ENCOUNTER — Telehealth (HOSPITAL_COMMUNITY): Payer: Self-pay

## 2022-10-31 ENCOUNTER — Emergency Department (HOSPITAL_BASED_OUTPATIENT_CLINIC_OR_DEPARTMENT_OTHER)
Admission: EM | Admit: 2022-10-31 | Discharge: 2022-11-01 | Disposition: A | Discharge status: Home / Self Care | Payer: Medicaid Other | Attending: Emergency Medicine | Admitting: Emergency Medicine

## 2022-10-31 ENCOUNTER — Ambulatory Visit (INDEPENDENT_AMBULATORY_CARE_PROVIDER_SITE_OTHER): Payer: Medicaid Other

## 2022-10-31 VITALS — BP 90/63 | HR 96 | Temp 98.0°F | Resp 18

## 2022-10-31 DIAGNOSIS — Z7901 Long term (current) use of anticoagulants: Secondary | ICD-10-CM | POA: Diagnosis not present

## 2022-10-31 DIAGNOSIS — I509 Heart failure, unspecified: Secondary | ICD-10-CM | POA: Diagnosis not present

## 2022-10-31 DIAGNOSIS — R21 Rash and other nonspecific skin eruption: Secondary | ICD-10-CM | POA: Diagnosis not present

## 2022-10-31 DIAGNOSIS — Z79899 Other long term (current) drug therapy: Secondary | ICD-10-CM | POA: Insufficient documentation

## 2022-10-31 DIAGNOSIS — E876 Hypokalemia: Secondary | ICD-10-CM | POA: Diagnosis not present

## 2022-10-31 DIAGNOSIS — R791 Abnormal coagulation profile: Secondary | ICD-10-CM | POA: Diagnosis not present

## 2022-10-31 DIAGNOSIS — R0602 Shortness of breath: Secondary | ICD-10-CM | POA: Insufficient documentation

## 2022-10-31 DIAGNOSIS — I5042 Chronic combined systolic (congestive) and diastolic (congestive) heart failure: Secondary | ICD-10-CM | POA: Insufficient documentation

## 2022-10-31 DIAGNOSIS — I253 Aneurysm of heart: Secondary | ICD-10-CM | POA: Diagnosis not present

## 2022-10-31 DIAGNOSIS — Z950 Presence of cardiac pacemaker: Secondary | ICD-10-CM | POA: Diagnosis not present

## 2022-10-31 LAB — COMPREHENSIVE METABOLIC PANEL
ALT: 38 U/L (ref 0–44)
ALT: 36 U/L (ref 0–44)
AST: 38 U/L (ref 15–41)
AST: 35 U/L (ref 15–41)
Albumin: 4.2 g/dL (ref 3.5–5.0)
Albumin: 4.6 g/dL (ref 3.5–5.0)
Alkaline Phosphatase: 127 U/L — ABNORMAL HIGH (ref 38–126)
Alkaline Phosphatase: 110 U/L (ref 38–126)
Anion gap: 20 — ABNORMAL HIGH (ref 5–15)
Anion gap: 9 (ref 5–15)
BUN: 25 mg/dL — ABNORMAL HIGH (ref 6–20)
BUN: 29 mg/dL — ABNORMAL HIGH (ref 6–20)
CO2: 28 mmol/L (ref 22–32)
CO2: 38 mmol/L — ABNORMAL HIGH (ref 22–32)
Calcium: 9.6 mg/dL (ref 8.9–10.3)
Calcium: 10.1 mg/dL (ref 8.9–10.3)
Chloride: 83 mmol/L — ABNORMAL LOW (ref 98–111)
Chloride: 86 mmol/L — ABNORMAL LOW (ref 98–111)
Creatinine, Ser: 1.16 mg/dL — ABNORMAL HIGH (ref 0.44–1.00)
Creatinine, Ser: 1.19 mg/dL — ABNORMAL HIGH (ref 0.44–1.00)
GFR, Estimated: 56 mL/min — ABNORMAL LOW (ref >60)
GFR, Estimated: 54 mL/min — ABNORMAL LOW (ref >60)
Glucose, Bld: 138 mg/dL — ABNORMAL HIGH (ref 70–99)
Glucose, Bld: 159 mg/dL — ABNORMAL HIGH (ref 70–99)
Potassium: 2.6 mmol/L — CL (ref 3.5–5.1)
Potassium: 2.8 mmol/L — ABNORMAL LOW (ref 3.5–5.1)
Sodium: 131 mmol/L — ABNORMAL LOW (ref 135–145)
Sodium: 133 mmol/L — ABNORMAL LOW (ref 135–145)
Total Bilirubin: 0.6 mg/dL (ref 0.3–1.2)
Total Bilirubin: 0.5 mg/dL (ref 0.3–1.2)
Total Protein: 7.8 g/dL (ref 6.5–8.1)
Total Protein: 8.3 g/dL — ABNORMAL HIGH (ref 6.5–8.1)

## 2022-10-31 LAB — POCT URINALYSIS DIP (MANUAL ENTRY)
Bilirubin, UA: NEGATIVE
Blood, UA: NEGATIVE
Glucose, UA: >=1000 mg/dL — AB
Ketones, POC UA: NEGATIVE mg/dL
Leukocytes, UA: NEGATIVE
Nitrite, UA: NEGATIVE
Protein Ur, POC: NEGATIVE mg/dL
Spec Grav, UA: 1.01 (ref 1.010–1.025)
Urobilinogen, UA: 0.2 E.U./dL
pH, UA: 5.5 (ref 5.0–8.0)

## 2022-10-31 LAB — BRAIN NATRIURETIC PEPTIDE: B Natriuretic Peptide: 57.4 pg/mL (ref 0.0–100.0)

## 2022-10-31 LAB — CBC
HCT: 43.6 % (ref 36.0–46.0)
Hemoglobin: 15.1 g/dL — ABNORMAL HIGH (ref 12.0–15.0)
MCH: 28.6 pg (ref 26.0–34.0)
MCHC: 34.6 g/dL (ref 30.0–36.0)
MCV: 82.6 fL (ref 80.0–100.0)
Platelets: 381 10*3/uL (ref 150–400)
RBC: 5.28 MIL/uL — ABNORMAL HIGH (ref 3.87–5.11)
RDW: 12.1 % (ref 11.5–15.5)
WBC: 13.4 10*3/uL — ABNORMAL HIGH (ref 4.0–10.5)
nRBC: 0 % (ref 0.0–0.2)

## 2022-10-31 LAB — POCT FASTING CBG KUC MANUAL ENTRY: POCT Glucose (KUC): 141 mg/dL — AB (ref 70–99)

## 2022-10-31 LAB — CBC WITH DIFFERENTIAL/PLATELET
Abs Immature Granulocytes: 0.04 10*3/uL (ref 0.00–0.07)
Basophils Absolute: 0.1 10*3/uL (ref 0.0–0.1)
Basophils Relative: 1 %
Eosinophils Absolute: 0.9 10*3/uL — ABNORMAL HIGH (ref 0.0–0.5)
Eosinophils Relative: 7 %
HCT: 44.3 % (ref 36.0–46.0)
Hemoglobin: 15.4 g/dL — ABNORMAL HIGH (ref 12.0–15.0)
Immature Granulocytes: 0 %
Lymphocytes Relative: 20 %
Lymphs Abs: 2.6 10*3/uL (ref 0.7–4.0)
MCH: 29.1 pg (ref 26.0–34.0)
MCHC: 34.8 g/dL (ref 30.0–36.0)
MCV: 83.6 fL (ref 80.0–100.0)
Monocytes Absolute: 0.9 10*3/uL (ref 0.1–1.0)
Monocytes Relative: 7 %
Neutro Abs: 8.7 10*3/uL — ABNORMAL HIGH (ref 1.7–7.7)
Neutrophils Relative %: 65 %
Platelets: 395 10*3/uL (ref 150–400)
RBC: 5.3 MIL/uL — ABNORMAL HIGH (ref 3.87–5.11)
RDW: 12.1 % (ref 11.5–15.5)
WBC: 13.3 10*3/uL — ABNORMAL HIGH (ref 4.0–10.5)
nRBC: 0 % (ref 0.0–0.2)

## 2022-10-31 LAB — D-DIMER, QUANTITATIVE: D-Dimer, Quant: 0.53 ug/mL-FEU — ABNORMAL HIGH (ref 0.00–0.50)

## 2022-10-31 LAB — TROPONIN I (HIGH SENSITIVITY)
Troponin I (High Sensitivity): 14 ng/L (ref <18)
Troponin I (High Sensitivity): 14 ng/L (ref <18)

## 2022-10-31 LAB — MAGNESIUM: Magnesium: 2.2 mg/dL (ref 1.7–2.4)

## 2022-10-31 MED ORDER — SODIUM CHLORIDE 0.9 % IV SOLN: Freq: Once | INTRAVENOUS | Status: AC

## 2022-10-31 MED ORDER — POTASSIUM CHLORIDE ER 10 MEQ PO TBCR
40.0000 meq | EXTENDED_RELEASE_TABLET | Freq: Two times a day (BID) | ORAL | 0 refills | Status: DC
Start: 2022-10-31 — End: 2022-11-20
  Filled 2022-10-31 – 2022-11-01 (×2): qty 16, 2d supply, fill #0

## 2022-10-31 MED ORDER — IOHEXOL 350 MG/ML SOLN
75.0000 mL | Freq: Once | INTRAVENOUS | Status: AC | PRN
  Administered 2022-10-31: 75 mL via INTRAVENOUS

## 2022-10-31 MED ORDER — PERMETHRIN 5 % EX CREA
TOPICAL_CREAM | CUTANEOUS | 1 refills | Status: DC
  Filled 2022-10-31: qty 60, 30d supply, fill #0

## 2022-10-31 MED ORDER — POTASSIUM CHLORIDE 20 MEQ PO PACK
40.0000 meq | PACK | Freq: Once | ORAL | Status: AC
  Administered 2022-10-31: 40 meq via ORAL
  Filled 2022-10-31: qty 2

## 2022-10-31 MED ORDER — POTASSIUM CHLORIDE 10 MEQ/100ML IV SOLN
10.0000 meq | INTRAVENOUS | Status: AC
  Administered 2022-10-31 – 2022-11-01 (×3): 10 meq via INTRAVENOUS
  Filled 2022-10-31 (×3): qty 100

## 2022-10-31 NOTE — ED Triage Notes (Signed)
Pt c/o SHOB since Thursday, was seen at [facility] earlier today for same. Dimer was reported elevated, sent for CTA. Compliant w home eliquis  Hx CHF, w pacemaker, crohns w flare up since Sunday.

## 2022-10-31 NOTE — ED Provider Notes (Signed)
[name] Provider Note   CSN: [id number] Arrival date & time: 10/31/22  1832     History  Chief Complaint  Patient presents with   Shortness of Breath    Christine [name] is a 57 y.o. female, history of Crohn's, CHF, who presents to the ED secondary to progressive shortness of breath, for the last 3 weeks that has been progressively getting worse.  She feels that when she is walking, she is very short of breath, and cannot catch her breath.  Denies any chest pain associated with this nausea or vomiting.  She has a history of a severe CHF, with defibrillator, called her cardiologist and he stated that she did not have any arrhythmias noted on her defibrillator device.  Additionally she went to urgent care, they checked a dimer and she states it was elevated and was sent here for CTA of her chest.  She denies any recent trauma, or surgeries.  Does state that she is currently in a Crohn's flare however, and also was told to come here because her potassium was still low.  She notes that her potassium intake has went from 60 mEq to 200 mEq, and she still is hypokalemic from time to time.  Has taken her 200 mEq of potassium as typical for her.    Home Medications Prior to Admission medications   Medication Sig Start Date End Date Taking? Authorizing Provider  potassium chloride (KLOR-CON) 10 MEQ tablet Take 4 tablets (40 mEq total) by mouth 2 (two) times daily for 2 days. 10/31/22 11/02/22 Yes Anarie Kalish L, PA  acetaminophen (TYLENOL) 500 MG tablet Take 1,000 mg by mouth 3 (three) times daily as needed for moderate pain or headache.    [provider]  ALPRAZolam Prudy Feeler) 1 MG tablet Take 2 mg by mouth 3 (three) times daily.    [provider]  alum & mag hydroxide-simeth (MAALOX/MYLANTA) 200-200-20 MG/5ML suspension Take 30 mLs by mouth every 4 (four) hours as needed for indigestion or heartburn. 03/02/22   Atway, Rayann N, DO   apixaban (ELIQUIS) 5 MG TABS tablet Take 1 tablet (5 mg total) by mouth 2 (two) times daily. 06/12/22   Bensimhon, Bevelyn Buckles, MD  atorvastatin (LIPITOR) 80 MG tablet Take 1 tablet (80 mg total) by mouth daily. 01/19/22   Laurey Morale, MD  buPROPion (WELLBUTRIN XL) 150 MG 24 hr tablet Take 300 mg by mouth daily.    [provider]  cyclobenzaprine (FLEXERIL) 10 MG tablet Take 20 mg by mouth at bedtime.    [provider]  dapagliflozin propanediol (FARXIGA) 10 MG TABS tablet Take 1 tablet (10 mg total) by mouth daily. 10/23/22   Bensimhon, Bevelyn Buckles, MD  diphenhydrAMINE (BENADRYL) 25 MG tablet Take 50 mg by mouth as needed for allergies.    [provider]  diphenoxylate-atropine (LOMOTIL) 2.5-0.025 MG tablet Take 2 tablets by mouth 4 (four) times daily as needed for diarrhea/loose stools. 08/08/22   Cirigliano, Vito V, DO  feeding supplement (ENSURE ENLIVE / ENSURE PLUS) LIQD Take 237 mLs by mouth 2 (two) times daily between meals. 03/03/22   Atway, Rayann N, DO  Fezolinetant (VEOZAH) 45 MG TABS Take 1 tablet (45 mg total) by mouth daily. 10/05/22   Marcine Matar, MD  furosemide (LASIX) 40 MG tablet Take 2 tablets (80 mg total) by mouth in the morning and every evening 08/22/22   Bensimhon, Bevelyn Buckles, MD  ivabradine (CORLANOR) 5 MG TABS tablet  Take 1 tablet (5 mg total) by mouth 2 (two) times daily. 03/15/22   Marcine Matar, MD  lipase/protease/amylase (CREON) 36000 UNITS CPEP capsule Take 2 capsules ( 72000 units) with meals and 1 capsule ( 36000 units) with snack. Patient taking differently: 36,000 Units with breakfast, with lunch, and with evening meal. Take 1 tablet before breakfast 03/13/22   Cirigliano, Vito V, DO  metolazone (ZAROXOLYN) 2.5 MG tablet Take 1 tablet (2.5 mg total) by mouth as directed by [facility] 10/09/22   Jacklynn Ganong, FNP  metoprolol succinate (TOPROL-XL) 25 MG 24 hr tablet Take 1 tablet (25 mg total) by mouth daily. Take  with or immediately following a meal. 07/05/22   Bensimhon, Bevelyn Buckles, MD  metroNIDAZOLE (METROGEL) 1 % gel Apply topically as needed.    [provider]  Multiple Vitamins-Minerals (MULTIVITAMIN WITH MINERALS) tablet Take 1 tablet by mouth daily.    [provider]  oxymetazoline (AFRIN) 0.05 % nasal spray Place 1-2 sprays into both nostrils 2 (two) times daily as needed for congestion.    [provider]  permethrin (ELIMITE) 5 % cream Apply from neck down before bed then wash off in the morning. May repeat in one week. 10/31/22   Mardella Layman, MD  potassium chloride (KLOR-CON) 10 MEQ tablet Take 10 tablets (100 mEq total) by mouth 2 (two) times daily. 09/25/22   Bensimhon, Bevelyn Buckles, MD  promethazine (PHENERGAN) 25 MG tablet Take 1 tablet (25 mg total) by mouth every 6 (six) to 8 (eight) hours for nausea or vomiting. 09/06/22   Cirigliano, Vito V, DO  spironolactone (ALDACTONE) 25 MG tablet Take 1 tablet (25 mg total) by mouth daily. 06/18/22   Bensimhon, Bevelyn Buckles, MD  zolpidem (AMBIEN) 10 MG tablet Take 5-10 mg by mouth at bedtime.    [provider]      Allergies    Sulfa antibiotics, Erythromycin, Tramadol, Ibuprofen, and Tape    Review of Systems   Review of Systems  Respiratory:  Positive for shortness of breath.   Cardiovascular:  Negative for chest pain.    Physical Exam Updated Vital Signs BP 104/74   Pulse 83   Temp 98.3 F (36.8 C) (Oral)   Resp 16   SpO2 98%  Physical Exam Vitals and nursing note reviewed.  Constitutional:      General: She is not in acute distress.    Appearance: She is well-developed.  HENT:     Head: Normocephalic and atraumatic.  Eyes:     Conjunctiva/sclera: Conjunctivae normal.  Cardiovascular:     Rate and Rhythm: Normal rate and regular rhythm.     Heart sounds: No murmur heard. Pulmonary:     Effort: Pulmonary effort is normal. No respiratory distress.     Breath sounds: Normal breath sounds.  Chest:      Comments: +defibrillator Abdominal:     Palpations: Abdomen is soft.     Tenderness: There is no abdominal tenderness.  Musculoskeletal:        General: No swelling.     Cervical back: Neck supple.  Skin:    General: Skin is warm and dry.     Capillary Refill: Capillary refill takes less than 2 seconds.  Neurological:     Mental Status: She is alert.  Psychiatric:        Mood and Affect: Mood normal.     ED Results / Procedures / Treatments   Labs (all labs ordered are listed, but  only abnormal results are displayed) Labs Reviewed  COMPREHENSIVE METABOLIC PANEL - Abnormal; Notable for the following components:      Result Value   Sodium 133 (*)    Potassium 2.8 (*)    Chloride 86 (*)    CO2 38 (*)    Glucose, Bld 159 (*)    BUN 29 (*)    Creatinine, Ser 1.19 (*)    Total Protein 8.3 (*)    GFR, Estimated 54 (*)    All other components within normal limits  CBC WITH DIFFERENTIAL/PLATELET - Abnormal; Notable for the following components:   WBC 13.3 (*)    RBC 5.30 (*)    Hemoglobin 15.4 (*)    Neutro Abs 8.7 (*)    Eosinophils Absolute 0.9 (*)    All other components within normal limits  MAGNESIUM  TROPONIN I (HIGH SENSITIVITY)  TROPONIN I (HIGH SENSITIVITY)    EKG EKG Interpretation Date/Time:  Wednesday October 31 2022 19:21:41 EDT Ventricular Rate:  88 PR Interval:  173 QRS Duration:  112 QT Interval:  446 QTC Calculation: 540 R Axis:   65  Text Interpretation: Sinus rhythm Anterolateral infarct, recent Prolonged QT interval Confirmed by Drema Pry [phone]) on 10/31/2022 11:47:48 PM  Radiology CT Angio Chest PE W and/or Wo Contrast  Result Date: 10/31/2022 CLINICAL DATA:  Shortness of breath. EXAM: CT ANGIOGRAPHY CHEST WITH CONTRAST TECHNIQUE: Multidetector CT imaging of the chest was performed using the standard protocol during bolus administration of intravenous contrast. Multiplanar CT image reconstructions and MIPs were obtained to evaluate the  vascular anatomy. RADIATION DOSE REDUCTION: This exam was performed according to the departmental dose-optimization program which includes automated exposure control, adjustment of the mA and/or kV according to patient size and/or use of iterative reconstruction technique. CONTRAST:  75mL OMNIPAQUE IOHEXOL 350 MG/ML SOLN COMPARISON:  March 01, 2022 FINDINGS: Cardiovascular: A single lead ventricular pacer is noted. The thoracic aorta is normal in appearance. Satisfactory opacification of the pulmonary arteries to the segmental level. No evidence of pulmonary embolism. Normal heart size. 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 A Duward Allbritton amount of adherent mural thrombus is again noted. No pericardial effusion. Mediastinum/Nodes: No enlarged mediastinal, hilar, or axillary lymph nodes. Thyroid gland, trachea, and esophagus demonstrate no significant findings. Lungs/Pleura: Lungs are clear. No pleural effusion or pneumothorax. Upper Abdomen: Multiple surgical clips are seen within the gallbladder fossa. Musculoskeletal: No chest wall abnormality. No acute or significant osseous findings. Review of the MIP images confirms the above findings. IMPRESSION: 1. No evidence of pulmonary embolism or acute cardiopulmonary disease. 2. 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 3. Evidence of prior cholecystectomy. Electronically Signed   By: Aram Candela M.D.   On: 10/31/2022 21:15   DG Chest 2 View  Result Date: 10/31/2022 CLINICAL DATA:  Shortness of breath with exertion. History of CHF and pacemaker. EXAM: CHEST - 2 VIEW COMPARISON:  Chest radiograph 11/28/2021 FINDINGS: The left chest wall cardiac device and single associated leads are stable. The cardiomediastinal silhouette is stable and within normal limits There is no focal consolidation or pulmonary edema. There is no pleural effusion or pneumothorax There is no acute osseous abnormality. Abdominal surgical clips are noted.  IMPRESSION: Stable chest with no radiographic evidence of acute cardiopulmonary process. Electronically Signed   By: Lesia Hausen M.D.   On: 10/31/2022 14:51    Procedures Procedures    Medications Ordered in ED Medications  potassium chloride 10 mEq in 100 mL IVPB (  10 mEq Intravenous New Bag/Given 10/31/22 2214)  0.9 %  sodium chloride infusion ( Intravenous New Bag/Given 10/31/22 2050)  iohexol (OMNIPAQUE) 350 MG/ML injection 75 mL (75 mLs Intravenous Contrast Given 10/31/22 2056)  potassium chloride (KLOR-CON) packet 40 mEq (40 mEq Oral Given 10/31/22 2307)    ED Course/ Medical Decision Making/ A&P             HEART Score: 4                Medical Decision Making Patient is a 57 year old female, sent by urgent care, for elevated D-dimer, and hypokalemia today.  Was recommended to get it CTA of her chest, as well as IV infusion for potassium.  She has been compliant with her at home Eliquis.  Aged out of it, her D-dimer was within normal limits, I discussed this with the patient, she would like to obtain a CTA chest for evaluation for possible clot as she is very concerned about this  CTA ordered.  As well as ordered magnesium given low potassium.  I believe that her potassium is likely low secondary to her recent Crohn's exacerbation, and she has had quite a bit of diarrhea.  She has had to increase her potassium quite a bit, as well as I am concerned that she may not be absorbing the potassium fully given damage to her Karman [place] intestine from the Crohn's.  Her abdominal pain is okay as of now, she has no distention or concerns, thus we will hold off on a CAT scan of her abdomen.  Amount and/or Complexity of Data Reviewed Labs: ordered.    Details: Potassium of 2.8, magnesium within normal limits, troponin within normal limits Radiology: ordered.    Details: CTA shows no evidence of any kind of PE, does show stable aneurysm. ECG/medicine tests:  Decision-making details documented in ED  Course. Discussion of management or test interpretation with external provider(s): Discussed with them, CTAs within normal limits, troponins are reassuring, 14, 14.  Potassium is low at 2.8, magnesium within normal limits.  She is receiving 40 mEq of IV potassium chloride, and 40 p.o. potassium chloride.  I have sent her home with potassium for replenishment in addition to her normal potassium.  She is receiving IV replenishment as of now. Discussed return precautions, she voiced understanding. Emphasized importance of weekly rechecks of potassium to ensure stabilization and not a need for OP IV Kcl replenishment, she will follow-up with PCP in regards to this. Additionally, discussed SOB on exertion may be cardiac related, she should follow-up with her cardiologist for further eval. Signed out to Dr. Eudelia Bunch pending completion of IV infusion.   Risk Prescription drug management.   Final Clinical Impression(s) / ED Diagnoses Final diagnoses:  SOB (shortness of breath)  Hypokalemia    Rx / DC Orders ED Discharge Orders          Ordered    potassium chloride (KLOR-CON) 10 MEQ tablet  2 times daily        10/31/22 2333              Aleanna Menge, Harley [place], PA 10/31/22 2356    Benjiman Core, MD 11/01/22 [phone]

## 2022-10-31 NOTE — Discharge Instructions (Addendum)
Please follow-up with your primary care doctor, and to discuss your low potassium.  I have increased your potassium to 40 mEq twice a day for the next 2 days, additional to what you typically take.  You should get your potassium rechecked of this next week, and follow-up with your primary care doctor in this next week.  I recommend that you likely have weekly potassium draws, as your potassium continues to trend downwards, please talk to your primary care doctor regarding this.  Additionally your CTA was reassuring there is no evidence of a PE, however I recommend you talk to your cardiologist required regarding your progressive worsening shortness of breath on exertion.

## 2022-10-31 NOTE — Telephone Encounter (Signed)
Received call from  shay in lab .  Reported Potassium  at 2.6.  notified Dr Tracie Harrier.

## 2022-10-31 NOTE — Discharge Instructions (Addendum)
You have had labs (blood tests) sent today. We will call you with any significant abnormalities or if there is need to begin or change treatment or pursue further follow up.  You may also review your test results online through MyChart. If you do not have a MyChart account, instructions to sign up should be on your discharge paperwork.  

## 2022-10-31 NOTE — ED Triage Notes (Signed)
SOB with exertion, dizziness when standing and walking, does have CHF and a pacemaker, pt is having diarrhea from chron's flare up that started Thursday.

## 2022-10-31 NOTE — Telephone Encounter (Signed)
Advised pt of Potassium 2.6 and per Dr.Hagler patient should report to ED for further work-up. Pt advised understanding.

## 2022-10-31 NOTE — ED Provider Notes (Signed)
[facility]   [id number] 10/31/22 Arrival Time: 1253  ASSESSMENT & PLAN:  1. SOB (shortness of breath) on exertion   2. Chronic combined systolic and diastolic heart failure (HCC)   3. Rash and nonspecific skin eruption    Pertinent lab abnormalities as below: Labs Reviewed  CBC - Abnormal; Notable for the following components:      Result Value   WBC 13.4 (*)    RBC 5.28 (*)    Hemoglobin 15.1 (*)    All other components within normal limits  COMPREHENSIVE METABOLIC PANEL - Abnormal; Notable for the following components:   Sodium 131 (*)    Potassium 2.6 (*)    Chloride 83 (*)    Glucose, Bld 138 (*)    BUN 25 (*)    Creatinine, Ser 1.16 (*)    Alkaline Phosphatase 127 (*)    GFR, Estimated 56 (*)    Anion gap 20 (*)    All other components within normal limits  D-DIMER, QUANTITATIVE - Abnormal; Notable for the following components:   D-Dimer, Quant 0.53 (*)    All other components within normal limits  POCT URINALYSIS DIP (MANUAL ENTRY) - Abnormal; Notable for the following components:   Glucose, UA >=1,000 (*)    All other components within normal limits  POCT FASTING CBG KUC MANUAL ENTRY - Abnormal; Notable for the following components:   POCT Glucose (KUC) 141 (*)    All other components within normal limits  BRAIN NATRIURETIC PEPTIDE   Will inform pt of labs. Given lab abnormalities (esp K2.6 and slightly elevated d-dimer) coupled with SOB on exertion she should go to the ED for further evaluation.  ECG: Performed today and interpreted by me: normal sinus rhythm; no acute changes from previous in EPIC but for some reason, today's is not crossing over into EPIC. Will have staff scan into her chart. No STEMI.  I have personally viewed the imaging studies ordered this visit. No acute changes on CXR.  As as aside, will tx empirically for scabies given non-specific itchy rash x several months. Trial of: Meds ordered this encounter  Medications    permethrin (ELIMITE) 5 % cream    Sig: Apply from neck down before bed then wash off in the morning. May repeat in one week.    Dispense:  60 g    Refill:  1   Reviewed expectations re: course of current medical issues. Questions answered. Outlined signs and symptoms indicating need for more acute intervention. Patient verbalized understanding. After Visit Summary given.   SUBJECTIVE:  History from: patient. Christine [name] is a 57 y.o. female with history of CHF, CAD reports SOB on exertion; first noted late evening 6 days ago. Only SOB with walking a very short distance; takes a few min to resolve. Ques CHF related. Denies any associated n/v/abd pain/CP. Always sleeps propped up secondary to CHF; sleep has not been disturbed by current symptoms. Defibrillator interrogated yesterday; reports normal function. Weight has remained stable. Denies LE edema. Compliant with medications. Denies: irregular heart beat, near-syncope, and palpitations. No new medications. Denies recreational drug use.  Social History   Tobacco Use  Smoking Status Former   Current packs/day: 0.00   Types: Cigarettes   Quit date: 01/15/2021   Years since quitting: 1.7  Smokeless Tobacco Never   Social History   Substance and Sexual Activity  Alcohol Use Yes   Comment: rarely   OBJECTIVE:  Vitals:   10/31/22 1313  BP: 90/63  Pulse: 96  Resp: 18  Temp: 98 F (36.7 C)  TempSrc: Oral  SpO2: 96%    General appearance: alert, oriented, no acute distress Eyes: PERRLA; EOMI; conjunctivae normal HENT: normocephalic; atraumatic Neck: supple with FROM Lungs: without labored respirations; speaks full sentences without difficulty; CTAB; does become winded when speaking a few sentences in a row Heart: regular Abdomen: soft Extremities: without edema; without calf swelling or tenderness; symmetrical without gross deformities Skin: warm and dry; scattered sub-cm indurations/excoriations on lower extremities  mainly; no bleeding or signs of infection Neuro: normal gait Psychological: alert and cooperative; normal mood and affect  Labs: Results for orders placed or performed during the hospital encounter of 10/31/22  CBC  Result Value Ref Range   WBC 13.4 (H) 4.0 - 10.5 K/uL   RBC 5.28 (H) 3.87 - 5.11 MIL/uL   Hemoglobin 15.1 (H) 12.0 - 15.0 g/dL   HCT 40.9 81.1 - 91.4 %   MCV 82.6 80.0 - 100.0 fL   MCH 28.6 26.0 - 34.0 pg   MCHC 34.6 30.0 - 36.0 g/dL   RDW 78.2 95.6 - 21.3 %   Platelets 381 150 - 400 K/uL   nRBC 0.0 0.0 - 0.2 %  Comprehensive metabolic panel  Result Value Ref Range   Sodium 131 (L) 135 - 145 mmol/L   Potassium 2.6 (LL) 3.5 - 5.1 mmol/L   Chloride 83 (L) 98 - 111 mmol/L   CO2 28 22 - 32 mmol/L   Glucose, Bld 138 (H) 70 - 99 mg/dL   BUN 25 (H) 6 - 20 mg/dL   Creatinine, Ser 0.86 (H) 0.44 - 1.00 mg/dL   Calcium 9.6 8.9 - 57.8 mg/dL   Total Protein 7.8 6.5 - 8.1 g/dL   Albumin 4.2 3.5 - 5.0 g/dL   AST 38 15 - 41 U/L   ALT 38 0 - 44 U/L   Alkaline Phosphatase 127 (H) 38 - 126 U/L   Total Bilirubin 0.6 0.3 - 1.2 mg/dL   GFR, Estimated 56 (L) >60 mL/min   Anion gap 20 (H) 5 - 15  Brain natriuretic peptide  Result Value Ref Range   B Natriuretic Peptide 57.4 0.0 - 100.0 pg/mL  D-dimer, quantitative  Result Value Ref Range   D-Dimer, Quant 0.53 (H) 0.00 - 0.50 ug/mL-FEU  POC urinalysis dipstick  Result Value Ref Range   Color, UA yellow yellow   Clarity, UA clear clear   Glucose, UA >=1,000 (A) negative mg/dL   Bilirubin, UA negative negative   Ketones, POC UA negative negative mg/dL   Spec Grav, UA 4.696 2.952 - 1.025   Blood, UA negative negative   pH, UA 5.5 5.0 - 8.0   Protein Ur, POC negative negative mg/dL   Urobilinogen, UA 0.2 0.2 or 1.0 E.U./dL   Nitrite, UA Negative Negative   Leukocytes, UA Negative Negative  POC CBG monitoring  Result Value Ref Range   POCT Glucose (K[facility]) 141 (A) 70 - 99 mg/dL   Labs Reviewed  CBC - Abnormal; Notable for the  following components:      Result Value   WBC 13.4 (*)    RBC 5.28 (*)    Hemoglobin 15.1 (*)    All other components within normal limits  COMPREHENSIVE METABOLIC PANEL - Abnormal; Notable for the following components:   Sodium 131 (*)    Potassium 2.6 (*)    Chloride 83 (*)    Glucose, Bld 138 (*)    BUN 25 (*)  Creatinine, Ser 1.16 (*)    Alkaline Phosphatase 127 (*)    GFR, Estimated 56 (*)    Anion gap 20 (*)    All other components within normal limits  D-DIMER, QUANTITATIVE - Abnormal; Notable for the following components:   D-Dimer, Quant 0.53 (*)    All other components within normal limits  POCT URINALYSIS DIP (MANUAL ENTRY) - Abnormal; Notable for the following components:   Glucose, UA >=1,000 (*)    All other components within normal limits  POCT FASTING CBG KUC MANUAL ENTRY - Abnormal; Notable for the following components:   POCT Glucose (K[facility]) 141 (*)    All other components within normal limits  BRAIN NATRIURETIC PEPTIDE    Imaging: DG Chest 2 View  Result Date: 10/31/2022 CLINICAL DATA:  Shortness of breath with exertion. History of CHF and pacemaker. EXAM: CHEST - 2 VIEW COMPARISON:  Chest radiograph 11/28/2021 FINDINGS: The left chest wall cardiac device and single associated leads are stable. The cardiomediastinal silhouette is stable and within normal limits There is no focal consolidation or pulmonary edema. There is no pleural effusion or pneumothorax There is no acute osseous abnormality. Abdominal surgical clips are noted. IMPRESSION: Stable chest with no radiographic evidence of acute cardiopulmonary process. Electronically Signed   By: Lesia Hausen M.D.   On: 10/31/2022 14:51     Allergies  Allergen Reactions   Sulfa Antibiotics Hives, Itching and Rash   Erythromycin Hives   Tramadol Rash and Other (See Comments)    Urinary retention     Ibuprofen Itching   Tape Rash    Past Medical History:  Diagnosis Date   Allergy    Anxiety 01/20/2021    CAD in native artery 08/24/2021   CHF (congestive heart failure) (HCC)    Chronic combined systolic and diastolic heart failure (HCC) 08/24/2021   Complication of anesthesia    Depression    Hyperlipidemia    Myocardial infarction [facility])    Presence of cardiac defibrillator 07/2021   Shortness of breath 01/20/2021   Sleep apnea 07/2021   Tobacco abuse 01/20/2021   Social History   Socioeconomic History   Marital status: Single    Spouse name: Not on file   Number of children: 0   Years of education: 16   Highest education level: Bachelor's degree (e.g., BA, AB, BS)  Occupational History   Not on file  Tobacco Use   Smoking status: Former    Current packs/day: 0.00    Types: Cigarettes    Quit date: 01/15/2021    Years since quitting: 1.7   Smokeless tobacco: Never  Vaping Use   Vaping status: Never Used  Substance and Sexual Activity   Alcohol use: Yes    Comment: rarely   Drug use: Never   Sexual activity: Not on file  Other Topics Concern   Not on file  Social History Narrative   Not on file   Social Determinants of Health   Financial Resource Strain: Medium Risk (09/12/2022)   Overall Financial Resource Strain (CARDIA)    Difficulty of Paying Living Expenses: Somewhat hard  Food Insecurity: Food Insecurity Present (09/12/2022)   Hunger Vital Sign    Worried About Running Out of Food in the Last Year: Sometimes true    Ran Out of Food in the Last Year: Patient declined  Transportation Needs: No Transportation Needs (09/12/2022)   PRAPARE - Administrator, Civil Service (Medical): No    Lack of Transportation (Non-Medical):  No  Physical Activity: Insufficiently Active (09/12/2022)   Exercise Vital Sign    Days of Exercise per Week: 4 days    Minutes of Exercise per Session: 20 min  Stress: No Stress Concern Present (09/12/2022)   [employer] of Occupational Health - Occupational Stress Questionnaire    Feeling of Stress : Only a little   Social Connections: Moderately Isolated (09/12/2022)   Social Connection and Isolation Panel [NHANES]    Frequency of Communication with Friends and Family: Twice a week    Frequency of Social Gatherings with Friends and Family: Once a week    Attends Religious Services: 1 to 4 times per year    Active Member of [employer] or Organizations: No    Attends Engineer, structural: Not on file    Marital Status: Never married  Intimate Partner Violence: Unknown (07/21/2021)   Received from [facility]   HITS    Physically Hurt: Not on file    Insult or Talk Down To: Not on file    Threaten Physical Harm: Not on file    Scream or Curse: Not on file   Family History  Problem Relation Age of Onset   Stroke Mother    Atrial fibrillation Mother    Heart failure Father    Stomach cancer Maternal Grandfather    Liver cancer Neg Hx    Esophageal cancer Neg Hx    Colon polyps Neg Hx    Colon cancer Neg Hx    Past Surgical History:  Procedure Laterality Date   ABDOMINAL HYSTERECTOMY     BIOPSY  02/26/2022   Procedure: BIOPSY;  Surgeon: Shellia Cleverly, DO;  Location: [facility];  Service: Gastroenterology;;   CARDIAC CATHETERIZATION     CHOLECYSTECTOMY     COLONOSCOPY WITH PROPOFOL N/A 02/26/2022   Procedure: COLONOSCOPY WITH PROPOFOL;  Surgeon: Shellia Cleverly, DO;  Location: [facility];  Service: Gastroenterology;  Laterality: N/A;   ESOPHAGOGASTRODUODENOSCOPY (EGD) WITH PROPOFOL N/A 02/26/2022   Procedure: ESOPHAGOGASTRODUODENOSCOPY (EGD) WITH PROPOFOL;  Surgeon: Shellia Cleverly, DO;  Location: [facility];  Service: Gastroenterology;  Laterality: N/A;   ICD IMPLANT N/A 07/21/2021   Procedure: ICD IMPLANT;  Surgeon: Duke Salvia, MD;  Location: [facility] INVASIVE CV LAB;  Service: Cardiovascular;  Laterality: N/A;   NECK SURGERY     OVARIAN CYST SURGERY     POLYPECTOMY  02/26/2022   Procedure: POLYPECTOMY;  Surgeon: Shellia Cleverly, DO;  Location: [facility];  Service:  Gastroenterology;;   RIGHT/LEFT HEART CATH AND CORONARY ANGIOGRAPHY N/A 01/23/2021   Procedure: RIGHT/LEFT HEART CATH AND CORONARY ANGIOGRAPHY;  Surgeon: Dolores Patty, MD;  Location: [facility] INVASIVE CV LAB;  Service: Cardiovascular;  Laterality: N/A;      Mardella Layman, MD 10/31/22 1733

## 2022-11-01 ENCOUNTER — Other Ambulatory Visit (HOSPITAL_COMMUNITY): Payer: Self-pay

## 2022-11-01 ENCOUNTER — Telehealth: Payer: Self-pay

## 2022-11-01 NOTE — Telephone Encounter (Signed)
Copied from CRM [id number]. Topic: General - Inquiry >> Nov 01, 2022 11:12 AM Patsy Lager T wrote: Reason for CRM: patient went to ER last night and they adv she needed to see her PCP to have potassium checked every week. Provider has no appt so patient would like for Dr Laural Benes to put in orders for her to have potassium checked this week. Please f/u with patient to let her know what she needs to do

## 2022-11-01 NOTE — Telephone Encounter (Signed)
Noted  

## 2022-11-01 NOTE — ED Notes (Signed)
Patient states feeling a lot better. Ambulated to bathroom and tolerated well, denies any dyspnea

## 2022-11-02 ENCOUNTER — Encounter: Payer: Self-pay | Admitting: Internal Medicine

## 2022-11-02 ENCOUNTER — Telehealth: Payer: Self-pay

## 2022-11-02 ENCOUNTER — Ambulatory Visit: Payer: Medicaid Other | Attending: Internal Medicine | Admitting: Internal Medicine

## 2022-11-02 ENCOUNTER — Other Ambulatory Visit (HOSPITAL_COMMUNITY): Payer: Self-pay

## 2022-11-02 VITALS — BP 95/63 | HR 70 | Temp 98.3°F | Ht 70.0 in | Wt 180.0 lb

## 2022-11-02 DIAGNOSIS — S99929A Unspecified injury of unspecified foot, initial encounter: Secondary | ICD-10-CM | POA: Diagnosis not present

## 2022-11-02 DIAGNOSIS — Z7984 Long term (current) use of oral hypoglycemic drugs: Secondary | ICD-10-CM | POA: Diagnosis not present

## 2022-11-02 DIAGNOSIS — E119 Type 2 diabetes mellitus without complications: Secondary | ICD-10-CM

## 2022-11-02 DIAGNOSIS — E876 Hypokalemia: Secondary | ICD-10-CM

## 2022-11-02 LAB — GLUCOSE, POCT (MANUAL RESULT ENTRY): POC Glucose: 99 mg/dl (ref 70–99)

## 2022-11-02 LAB — POCT GLYCOSYLATED HEMOGLOBIN (HGB A1C): HbA1c, POC (controlled diabetic range): 7 % (ref 0.0–7.0)

## 2022-11-02 MED ORDER — ACCU-CHEK GUIDE W/DEVICE KIT
PACK | 0 refills | Status: AC
Start: 2022-11-02 — End: ?
  Filled 2022-11-02: qty 1, 30d supply, fill #0

## 2022-11-02 MED ORDER — ACCU-CHEK GUIDE VI STRP
ORAL_STRIP | 12 refills | Status: DC
Start: 2022-11-02 — End: 2023-06-28
  Filled 2022-11-02: qty 100, 90d supply, fill #0

## 2022-11-02 MED ORDER — SITAGLIPTIN PHOSPHATE 50 MG PO TABS
50.0000 mg | ORAL_TABLET | Freq: Every day | ORAL | 1 refills | Status: DC
Start: 2022-11-02 — End: 2022-12-31
  Filled 2022-11-02 – 2022-11-07 (×2): qty 30, 30d supply, fill #0
  Filled 2022-11-30: qty 30, 30d supply, fill #1

## 2022-11-02 MED ORDER — ACCU-CHEK SOFTCLIX LANCETS MISC
12 refills | Status: AC
Start: 2022-11-02 — End: ?
  Filled 2022-11-02: qty 100, 90d supply, fill #0
  Filled 2023-04-02: qty 100, 90d supply, fill #1
  Filled 2023-06-28: qty 100, 90d supply, fill #2
  Filled 2023-08-12 – 2023-10-14 (×2): qty 100, 90d supply, fill #0

## 2022-11-02 NOTE — Patient Instructions (Signed)
Check blood sugars daily before breakfast.  Blood sugar goals before meals is 90-130. Start Januvia 50 mg daily.    Diabetes Mellitus and Standards of Medical Care Living with and managing diabetes (diabetes mellitus) can be complicated. Your diabetes treatment may be managed by a team of health care providers, including: A physician who specializes in diabetes (endocrinologist). You might also have visits with a nurse practitioner or physician assistant. Nurses. A registered dietitian. A certified diabetes care and education specialist. An exercise specialist. A pharmacist. An eye doctor. A foot specialist (podiatrist). A dental care provider. A primary care provider. A mental health care provider. How to manage your diabetes You can do many things to successfully manage your diabetes. Your health care providers will follow guidelines to help you get the best quality of care. Here are general guidelines for your diabetes management plan. Your health care providers may give you more specific instructions. Physical exams When you are diagnosed with diabetes, and each year after that, your health care provider will ask about your medical and family history. You will have a physical exam, which may include: Measuring your height, weight, and body mass index (BMI). Checking your blood pressure. This will be done at every routine medical visit. Your target blood pressure may vary depending on your medical conditions, your age, and other factors. A thyroid exam. A skin exam. Screening for nerve damage (peripheral neuropathy). This may include checking the pulse in your legs and feet and the level of sensation in your hands and feet. A foot exam to inspect the structure and skin of your feet, including checking for cuts, bruises, redness, blisters, sores, or other problems. Screening for blood vessel (vascular) problems. This may include checking the pulse in your legs and feet and checking your  temperature. Blood tests Depending on your treatment plan and your personal needs, you may have the following tests: Hemoglobin A1C (HbA1C). This test provides information about blood sugar (glucose) control over the previous 2-3 months. It is used to adjust your treatment plan, if needed. This test will be done: At least 2 times a year, if you are meeting your treatment goals. 4 times a year, if you are not meeting your treatment goals or if your goals have changed. Lipid testing, including total cholesterol, LDL and HDL cholesterol, and triglyceride levels. The goal for LDL is less than 100 mg/dL (5.5 mmol/L). If you are at high risk for complications, the goal is less than 70 mg/dL (3.9 mmol/L). The goal for HDL is 40 mg/dL (2.2 mmol/L) or higher for men, and 50 mg/dL (2.8 mmol/L) or higher for women. An HDL cholesterol of 60 mg/dL (3.3 mmol/L) or higher gives some protection against heart disease. The goal for triglycerides is less than 150 mg/dL (8.3 mmol/L). Liver function tests. Kidney function tests. Thyroid function tests.  Dental and eye exams  Visit your dentist two times a year. If you have type 1 diabetes, your health care provider may recommend an eye exam within 5 years after you are diagnosed, and then once a year after your first exam. For children with type 1 diabetes, the health care provider may recommend an eye exam when your child is age 29 or older and has had diabetes for 3-5 years. After the first exam, your child should get an eye exam once a year. If you have type 2 diabetes, your health care provider may recommend an eye exam as soon as you are diagnosed, and then every 1-2 years  after your first exam. Immunizations A yearly flu (influenza) vaccine is recommended annually for everyone 6 months or older. This is especially important if you have diabetes. The pneumonia (pneumococcal) vaccine is recommended for everyone 2 years or older who has diabetes. If you are age  34 or older, you may get the pneumonia vaccine as a series of two separate shots. The hepatitis B vaccine is recommended for adults shortly after being diagnosed with diabetes. Adults and children with diabetes should receive all other vaccines according to age-specific recommendations from the Centers for Disease Control and Prevention (CDC). Mental and emotional health Screening for symptoms of eating disorders, anxiety, and depression is recommended at the time of diagnosis and after as needed. If your screening shows that you have symptoms, you may need more evaluation. You may work with a mental health care provider. Follow these instructions at home: Treatment plan You will monitor your blood glucose levels and may give yourself insulin. Your treatment plan will be reviewed at every medical visit. You and your health care provider will discuss: How you are taking your medicines, including insulin. Any side effects you have. Your blood glucose level target goals. How often you monitor your blood glucose level. Lifestyle habits, such as activity level and tobacco, alcohol, and substance use. Education Your health care provider will assess how well you are monitoring your blood glucose levels and whether you are taking your insulin and medicines correctly. He or she may refer you to: A certified diabetes care and education specialist to manage your diabetes throughout your life, starting at diagnosis. A registered dietitian who can create and review your personal nutrition plan. An exercise specialist who can discuss your activity level and exercise plan. General instructions Take over-the-counter and prescription medicines only as told by your health care provider. Keep all follow-up visits. This is important. Where to find support There are many diabetes support networks, including: American Diabetes Association (ADA): diabetes.org Defeat Diabetes Foundation: defeatdiabetes.org Where to  find more information American Diabetes Association (ADA): www.diabetes.org Association of Diabetes Care & Education Specialists (ADCES): diabeteseducator.org International Diabetes Federation (IDF): http://hill.biz/ Summary Managing diabetes (diabetes mellitus) can be complicated. Your diabetes treatment may be managed by a team of health care providers. Your health care providers follow guidelines to help you get the best quality care. You should have physical exams, blood tests, blood pressure monitoring, immunizations, and screening tests regularly. Stay updated on how to manage your diabetes. Your health care providers may also give you more specific instructions based on your individual health. This information is not intended to replace advice given to you by your health care provider. Make sure you discuss any questions you have with your health care provider. Document Revised: 10/08/2019 Document Reviewed: 10/08/2019 Elsevier Patient Education  2024 [employer].

## 2022-11-02 NOTE — Transitions of Care (Post Inpatient/ED Visit) (Signed)
11/02/2022  Name: Christine [name] MRN: [id number] DOB: 05-22-1965  Today's TOC FU Call Status: Today's TOC FU Call Status:: Successful TOC FU Call Competed TOC FU Call Complete Date: 11/02/22  Transition Care Management Follow-up Telephone Call Date of Discharge: 11/01/22 Discharge Facility: [facility] ([facility]) Type of Discharge: Emergency Department Reason for ED Visit:  (SOB) How have you been since you were released from the hospital?: Better Any questions or concerns?: No  Items Reviewed: Did you receive and understand the discharge instructions provided?: Yes Medications obtained,verified, and reconciled?:  (Going to pick up today) Any new allergies since your discharge?: No Dietary orders reviewed?: No Do you have support at home?: Yes  Medications Reviewed Today: Medications Reviewed Today     Reviewed by Encarnacion Chu, RN (Registered Nurse) on 10/31/22 at 1321  Med List Status: <None>   Medication Order Taking? Sig Documenting Provider Last Dose Status Informant  acetaminophen (TYLENOL) 500 MG tablet [id number] Yes Take 1,000 mg by mouth 3 (three) times daily as needed for moderate pain or headache. [provider] Past Month Active Self, Pharmacy Records  ALPRAZolam Prudy Feeler) 1 MG tablet [id number] Yes Take 2 mg by mouth 3 (three) times daily. [provider] 10/31/2022 Active Self, Pharmacy Records  alum & mag hydroxide-simeth (MAALOX/MYLANTA) 200-200-20 MG/5ML suspension [id number] Yes Take 30 mLs by mouth every 4 (four) hours as needed for indigestion or heartburn. Chauncey Mann, DO Past Month Active   apixaban (ELIQUIS) 5 MG TABS tablet [id number] Yes Take 1 tablet (5 mg total) by mouth 2 (two) times daily. Bensimhon, Bevelyn Buckles, MD 10/31/2022 Active   atorvastatin (LIPITOR) 80 MG tablet [id number] Yes Take 1 tablet (80 mg total) by mouth daily. Laurey Morale, MD 10/31/2022 Active Self, Pharmacy Records  buPROPion (WELLBUTRIN XL) 150 MG 24 hr  tablet [id number] Yes Take 300 mg by mouth daily. [provider] 10/31/2022 Active   cyclobenzaprine (FLEXERIL) 10 MG tablet [id number] Yes Take 20 mg by mouth at bedtime. [provider] 10/30/2022 Active Self, Pharmacy Records           Med Note Mable Fill Mar 05, 2022  1:46 PM)    dapagliflozin propanediol (FARXIGA) 10 MG TABS tablet [id number] Yes Take 1 tablet (10 mg total) by mouth daily. Bensimhon, Bevelyn Buckles, MD 10/31/2022 Active   diphenhydrAMINE (BENADRYL) 25 MG tablet [id number] Yes Take 50 mg by mouth as needed for allergies. [provider] Past Month Active Self, Pharmacy Records  diphenoxylate-atropine (LOMOTIL) 2.5-0.025 MG tablet [id number] Yes Take 2 tablets by mouth 4 (four) times daily as needed for diarrhea/loose stools. Cirigliano, Vito V, DO 10/30/2022 Active   feeding supplement (ENSURE ENLIVE / ENSURE PLUS) LIQD [id number] Yes Take 237 mLs by mouth 2 (two) times daily between meals. Chauncey Mann, DO 10/31/2022 Active   Fezolinetant (VEOZAH) 45 MG TABS [id number] Yes Take 1 tablet (45 mg total) by mouth daily. Marcine Matar, MD Past Month Active   furosemide (LASIX) 40 MG tablet [id number] Yes Take 2 tablets (80 mg total) by mouth in the morning and every evening Bensimhon, Bevelyn Buckles, MD 10/31/2022 Active   ivabradine (CORLANOR) 5 MG TABS tablet [id number] Yes Take 1 tablet (5 mg total) by mouth 2 (two) times daily. Marcine Matar, MD 10/31/2022 Active   lipase/protease/amylase (CREON) 36000 UNITS CPEP capsule [id number] Yes Take 2 capsules ( 72000 units) with meals and 1 capsule ( 36000 units) with snack.  Patient taking differently: 36,000 Units with breakfast,  with lunch, and with evening meal. Take 1 tablet before breakfast   Cirigliano, Vito V, DO 10/31/2022 Active   metolazone (ZAROXOLYN) 2.5 MG tablet [id number] Yes Take 1 tablet (2.5 mg total) by mouth as directed by [facility] [place], [place], [place] Past Month Active    metoprolol succinate (TOPROL-XL) 25 MG 24 hr tablet [id number] Yes Take 1 tablet (25 mg total) by mouth daily. Take with or immediately following a meal. Bensimhon, Bevelyn Buckles, MD 10/31/2022 Active   metroNIDAZOLE (METROGEL) 1 % gel [id number] Yes Apply topically as needed. [provider] Past Month Active   Multiple Vitamins-Minerals (MULTIVITAMIN WITH MINERALS) tablet [id number] Yes Take 1 tablet by mouth daily. [provider] 10/31/2022 Active Self, Pharmacy Records  oxymetazoline (AFRIN) 0.05 % nasal spray [id number] Yes Place 1-2 sprays into both nostrils 2 (two) times daily as needed for congestion. [provider] 10/31/2022 Active Self, Pharmacy Records  potassium chloride (KLOR-CON) 10 MEQ tablet [id number] Yes Take 10 tablets (100 mEq total) by mouth 2 (two) times daily. Bensimhon, Bevelyn Buckles, MD 10/31/2022 Active   promethazine (PHENERGAN) 25 MG tablet [id number] Yes Take 1 tablet (25 mg total) by mouth every 6 (six) to 8 (eight) hours for nausea or vomiting. Doristine Locks V, DO 10/31/2022 Active   spironolactone (ALDACTONE) 25 MG tablet [id number] Yes Take 1 tablet (25 mg total) by mouth daily. Bensimhon, Bevelyn Buckles, MD 10/31/2022 Active   zolpidem (AMBIEN) 10 MG tablet [id number] Yes Take 5-10 mg by mouth at bedtime. [provider] 10/30/2022 Active Self, Pharmacy Records            Home Care and Equipment/Supplies: Were Home Health Services Ordered?: No Any new equipment or medical supplies ordered?: No  Functional Questionnaire: Do you need assistance with bathing/showering or dressing?: No Do you need assistance with meal preparation?: No Do you need assistance with eating?: No Do you have difficulty maintaining continence: No Do you need assistance with getting out of bed/getting out of a chair/moving?: No Do you have difficulty managing or taking your medications?: No  Follow up appointments reviewed: PCP Follow-up appointment confirmed?: Yes Date  of PCP follow-up appointment?: 11/02/22 Specialist Hospital Follow-up appointment confirmed?: No Do you need transportation to your follow-up appointment?: No Do you understand care options if your condition(s) worsen?: Yes-patient verbalized understanding   SDOH Interventions    Flowsheet Row Telephone from 10/01/2022 in [facility] Office Visit from 03/20/2022 in [facility] from 01/31/2021 in [facility]  SDOH Interventions     Food Insecurity Interventions Assist with SNAP Application -- Intervention Not Indicated  Housing Interventions -- -- Intervention Not Indicated  Transportation Interventions -- -- Intervention Not Indicated  Depression Interventions/Treatment  -- Currently on Treatment --  Financial Strain Interventions -- -- Financial Counselor       Gus Puma, Kenard Gower, MHA [facility]  Managed [facility] Social Worker [phone]

## 2022-11-02 NOTE — Progress Notes (Signed)
Patient ID: Christine [name], female    DOB: [birth year]  MRN: [id number]  CC: Follow-up (ER f/u - pt informed to have potassium checked weekly. /L tonenail injury today - half of toenail came off )   Subjective: Christine [name] is a 57 y.o. 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   Seen in ER 2 days ago for SOB x several days.  Troponin was negative.  BNP normal. CXR revealed no acute findings CTA neg for P.E or acute cardiopulmonary ds K level was low at 2.6.  Had K+ infusion She was on K+ supplement 100 meq BID. Told to increase told intake by 80 meq/day.  Currently taking 100/40/40/100. Also on Spiro 25 mg day.  On lasix 80 mg BID and Zaroxolyn 50 mg if she gains 6 lbs over 3 days.  Takes latter about 2x/mth.  No issues with low K before being on diuretics  + glucose in urine in ER and BS was 159. BS today is 99 and A1C 7.  Fhx in dad. No frequent thirst unless Crohn's flare.  No numbness in feet PreDM from 1 yr ago Tries to walk at park when not too hot outside.    Partially ripped her LT big toenail off before coming here today. In March of this yr, she was seen at [facility] after something feel on the toe causing hemorrhage under the nail at that time. They punch a hole in nail to releave the pressure/blood. Has appt on Monday with Podiatry.  Patient Active Problem List   Diagnosis Date Noted   Crohn's disease involving terminal ileum (HCC) 07/23/2022   Crohn's disease of large intestine with other complication (HCC) 05/11/2022   Mild major depression (HCC) 05/11/2022   Paroxysmal tachycardia, unspecified (HCC) 05/11/2022   Essential hypertension 03/02/2022   Acute  pancreatitis 03/01/2022   Hypokalemia 03/01/2022   Ileitis 03/01/2022   Diarrhea 02/26/2022   Nausea without vomiting 02/26/2022   Loss of weight 02/26/2022   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   Ulcer of ileum 02/26/2022   Diverticulosis of colon without hemorrhage 02/26/2022   Cardiomyopathy (HCC) ischemic and non-ischemic 01/16/2022   Abdominal aortic aneurysm (AAA) 3.0 cm to 5.0 cm in diameter in female (HCC) 01/09/2022   Polycythemia 01/09/2022   Chronic combined systolic and diastolic heart failure (HCC) 08/24/2021   CAD in native artery 08/24/2021   S/P ICD (internal cardiac defibrillator) procedure 08/10/2021   Obstructive sleep apnea 05/05/2021   Former smoker 02/06/2021   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   Obesity (BMI 30.0-34.9) 02/06/2021   Anxiety 01/20/2021   Tobacco abuse 01/20/2021   Shortness of breath 01/20/2021   Chest tightness      Current Outpatient Medications on File Prior to Visit  Medication Sig Dispense Refill   acetaminophen (TYLENOL) 500 MG tablet Take 1,000 mg by mouth 3 (three) times daily as needed for moderate pain or headache.     ALPRAZolam (XANAX) 1 MG tablet Take 2 mg by mouth 3 (three) times daily.     alum & mag hydroxide-simeth (MAALOX/MYLANTA) 200-200-20 MG/5ML suspension Take 30 mLs by mouth every 4 (four) hours as needed for indigestion or heartburn. 355 mL 0   apixaban (  ELIQUIS) 5 MG TABS tablet Take 1 tablet (5 mg total) by mouth 2 (two) times daily. 60 tablet 4   atorvastatin (LIPITOR) 80 MG tablet Take 1 tablet (80 mg total) by mouth daily. 90 tablet 3   buPROPion (WELLBUTRIN XL) 150 MG 24 hr tablet Take 300 mg by mouth daily.     cyclobenzaprine (FLEXERIL) 10 MG tablet Take 20 mg by mouth at bedtime.     dapagliflozin propanediol (FARXIGA) 10 MG TABS tablet Take 1 tablet (10 mg total) by mouth daily. 30 tablet 9   diphenhydrAMINE (BENADRYL) 25 MG tablet Take 50 mg by mouth as needed for allergies.     diphenoxylate-atropine  (LOMOTIL) 2.5-0.025 MG tablet Take 2 tablets by mouth 4 (four) times daily as needed for diarrhea/loose stools. 60 tablet 3   feeding supplement (ENSURE ENLIVE / ENSURE PLUS) LIQD Take 237 mLs by mouth 2 (two) times daily between meals. 237 mL 12   Fezolinetant (VEOZAH) 45 MG TABS Take 1 tablet (45 mg total) by mouth daily. 30 tablet 0   furosemide (LASIX) 40 MG tablet Take 2 tablets (80 mg total) by mouth in the morning and every evening 120 tablet 3   ivabradine (CORLANOR) 5 MG TABS tablet Take 1 tablet (5 mg total) by mouth 2 (two) times daily. 60 tablet 6   lipase/protease/amylase (CREON) 36000 UNITS CPEP capsule Take 2 capsules ( 72000 units) with meals and 1 capsule ( 36000 units) with snack. (Patient taking differently: 36,000 Units with breakfast, with lunch, and with evening meal. Take 1 tablet before breakfast) 300 capsule 5   metolazone (ZAROXOLYN) 2.5 MG tablet Take 1 tablet (2.5 mg total) by mouth as directed by [facility] 15 tablet 0   metoprolol succinate (TOPROL-XL) 25 MG 24 hr tablet Take 1 tablet (25 mg total) by mouth daily. Take with or immediately following a meal. 30 tablet 11   metroNIDAZOLE (METROGEL) 1 % gel Apply topically as needed.     Multiple Vitamins-Minerals (MULTIVITAMIN WITH MINERALS) tablet Take 1 tablet by mouth daily.     oxymetazoline (AFRIN) 0.05 % nasal spray Place 1-2 sprays into both nostrils 2 (two) times daily as needed for congestion.     permethrin (ELIMITE) 5 % cream Apply from neck down before bed then wash off in the morning. May repeat in one week. 60 g 1   potassium chloride (KLOR-CON) 10 MEQ tablet Take 10 tablets (100 mEq total) by mouth 2 (two) times daily. 600 tablet 2   potassium chloride (KLOR-CON) 10 MEQ tablet Take 4 tablets (40 mEq total) by mouth 2 (two) times daily for 2 days. 16 tablet 0   promethazine (PHENERGAN) 25 MG tablet Take 1 tablet (25 mg total) by mouth every 6 (six) to 8 (eight) hours for nausea or vomiting. 90  tablet 2   spironolactone (ALDACTONE) 25 MG tablet Take 1 tablet (25 mg total) by mouth daily. 45 tablet 3   zolpidem (AMBIEN) 10 MG tablet Take 5-10 mg by mouth at bedtime.     No current facility-administered medications on file prior to visit.    Allergies  Allergen Reactions   Sulfa Antibiotics Hives, Itching and Rash   Erythromycin Hives   Tramadol Rash and Other (See Comments)    Urinary retention     Ibuprofen Itching   Tape Rash    Social History   Socioeconomic History   Marital status: Single    Spouse name: Not on file   Number of children:  0   Years of education: 16   Highest education level: Bachelor's degree (e.g., BA, AB, BS)  Occupational History   Not on file  Tobacco Use   Smoking status: Former    Current packs/day: 0.00    Types: Cigarettes    Quit date: 01/15/2021    Years since quitting: 1.7   Smokeless tobacco: Never  Vaping Use   Vaping status: Never Used  Substance and Sexual Activity   Alcohol use: Yes    Comment: rarely   Drug use: Never   Sexual activity: Not on file  Other Topics Concern   Not on file  Social History Narrative   Not on file   Social Determinants of Health   Financial Resource Strain: Medium Risk (09/12/2022)   Overall Financial Resource Strain (CARDIA)    Difficulty of Paying Living Expenses: Somewhat hard  Food Insecurity: No Food Insecurity (11/02/2022)   Hunger Vital Sign    Worried About Running Out of Food in the Last Year: Never true    Ran Out of Food in the Last Year: Never true  Recent Concern: Food Insecurity - Food Insecurity Present (09/12/2022)   Hunger Vital Sign    Worried About Running Out of Food in the Last Year: Sometimes true    Ran Out of Food in the Last Year: Patient declined  Transportation Needs: No Transportation Needs (11/02/2022)   PRAPARE - Administrator, Civil Service (Medical): No    Lack of Transportation (Non-Medical): No  Physical Activity: Insufficiently Active  (09/12/2022)   Exercise Vital Sign    Days of Exercise per Week: 4 days    Minutes of Exercise per Session: 20 min  Stress: No Stress Concern Present (09/12/2022)   [employer] of Occupational Health - Occupational Stress Questionnaire    Feeling of Stress : Only a little  Social Connections: Moderately Isolated (09/12/2022)   Social Connection and Isolation Panel [NHANES]    Frequency of Communication with Friends and Family: Twice a week    Frequency of Social Gatherings with Friends and Family: Once a week    Attends Religious Services: 1 to 4 times per year    Active Member of [employer] or Organizations: No    Attends Engineer, structural: Not on file    Marital Status: Never married  Intimate Partner Violence: Unknown (07/21/2021)   Received from [facility]   HITS    Physically Hurt: Not on file    Insult or Talk Down To: Not on file    Threaten Physical Harm: Not on file    Scream or Curse: Not on file    Family History  Problem Relation Age of Onset   Stroke Mother    Atrial fibrillation Mother    Heart failure Father    Stomach cancer Maternal Grandfather    Liver cancer Neg Hx    Esophageal cancer Neg Hx    Colon polyps Neg Hx    Colon cancer Neg Hx     Past Surgical History:  Procedure Laterality Date   ABDOMINAL HYSTERECTOMY     BIOPSY  02/26/2022   Procedure: BIOPSY;  Surgeon: Shellia Cleverly, DO;  Location: [facility];  Service: Gastroenterology;;   CARDIAC CATHETERIZATION     CHOLECYSTECTOMY     COLONOSCOPY WITH PROPOFOL N/A 02/26/2022   Procedure: COLONOSCOPY WITH PROPOFOL;  Surgeon: Shellia Cleverly, DO;  Location: [facility];  Service: Gastroenterology;  Laterality: N/A;   ESOPHAGOGASTRODUODENOSCOPY (EGD) WITH PROPOFOL N/A  02/26/2022   Procedure: ESOPHAGOGASTRODUODENOSCOPY (EGD) WITH PROPOFOL;  Surgeon: Shellia Cleverly, DO;  Location: [facility];  Service: Gastroenterology;  Laterality: N/A;   ICD IMPLANT N/A 07/21/2021    Procedure: ICD IMPLANT;  Surgeon: Duke Salvia, MD;  Location: [facility] INVASIVE CV LAB;  Service: Cardiovascular;  Laterality: N/A;   NECK SURGERY     OVARIAN CYST SURGERY     POLYPECTOMY  02/26/2022   Procedure: POLYPECTOMY;  Surgeon: Shellia Cleverly, DO;  Location: [facility];  Service: Gastroenterology;;   RIGHT/LEFT HEART CATH AND CORONARY ANGIOGRAPHY N/A 01/23/2021   Procedure: RIGHT/LEFT HEART CATH AND CORONARY ANGIOGRAPHY;  Surgeon: Dolores Patty, MD;  Location: [facility] INVASIVE CV LAB;  Service: Cardiovascular;  Laterality: N/A;    ROS: Review of Systems Negative except as stated above  PHYSICAL EXAM: BP 95/63 (BP Location: Left Arm, Patient Position: Sitting, Cuff Size: Normal)   Pulse 70   Temp 98.3 F (36.8 C) (Oral)   Ht 5\' 10"  (1.778 m)   Wt 180 lb (81.6 kg)   SpO2 97%   BMI 25.83 kg/m   Physical Exam  General appearance -  well appearing, and in no distress Mental status - alert, oriented to person, place, and time Chest - clear to auscultation, no wheezes, rales or rhonchi, symmetric air entry Heart - normal rate, regular rhythm, normal S1, S2, no murmurs, rubs, clicks or gallops Skin - LT foot.  Toenail on LT big toe is hanging on only on medial edge  Results for orders placed or performed in visit on 11/02/22  POCT glucose (manual entry)  Result Value Ref Range   POC Glucose 99 70 - 99 mg/dl  POCT glycosylated hemoglobin (Hb A1C)  Result Value Ref Range   Hemoglobin A1C     HbA1c POC (<> result, manual entry)     HbA1c, POC (prediabetic range)     HbA1c, POC (controlled diabetic range) 7.0 0.0 - 7.0 %       Latest Ref Rng & Units 10/31/2022    7:09 PM 10/31/2022    3:45 PM 10/09/2022   12:10 PM  CMP  Glucose 70 - 99 mg/dL 102  725  366   BUN 6 - 20 mg/dL 29  25  15    Creatinine 0.44 - 1.00 mg/dL 4.40  3.47  4.25   Sodium 135 - 145 mmol/L 133  131  135   Potassium 3.5 - 5.1 mmol/L 2.8  2.6  4.2   Chloride 98 - 111 mmol/L 86  83  98   CO2 22 - 32  mmol/L 38  28  28   Calcium 8.9 - 10.3 mg/dL 95.6  9.6  9.3   Total Protein 6.5 - 8.1 g/dL 8.3  7.8    Total Bilirubin 0.3 - 1.2 mg/dL 0.5  0.6    Alkaline Phos 38 - 126 U/L 110  127    AST 15 - 41 U/L 35  38    ALT 0 - 44 U/L 36  38     Lipid Panel     Component Value Date/Time   CHOL 185 03/01/2022 0339   TRIG 221 (H) 03/01/2022 0339   HDL 36 (L) 03/01/2022 0339   CHOLHDL 5.1 03/01/2022 0339   VLDL 44 (H) 03/01/2022 0339   LDLCALC 105 (H) 03/01/2022 0339    CBC    Component Value Date/Time   WBC 13.3 (H) 10/31/2022 1909   RBC 5.30 (H) 10/31/2022 1909   HGB 15.4 (H)  10/31/2022 1909   HGB 14.9 12/25/2021 1119   HGB 17.5 (H) 08/10/2021 1023   HCT 44.3 10/31/2022 1909   HCT 50.7 (H) 08/10/2021 1023   PLT 395 10/31/2022 1909   PLT 326 12/25/2021 1119   PLT 374 08/10/2021 1023   MCV 83.6 10/31/2022 1909   MCV 86 08/10/2021 1023   MCH 29.1 10/31/2022 1909   MCHC 34.8 10/31/2022 1909   RDW 12.1 10/31/2022 1909   RDW 13.1 08/10/2021 1023   LYMPHSABS 2.6 10/31/2022 1909   LYMPHSABS 1.8 08/10/2021 1023   MONOABS 0.9 10/31/2022 1909   EOSABS 0.9 (H) 10/31/2022 1909   EOSABS 0.3 08/10/2021 1023   BASOSABS 0.1 10/31/2022 1909   BASOSABS 0.1 08/10/2021 1023    ASSESSMENT AND PLAN: 1. New onset type 2 diabetes mellitus (HCC) Discussed the importance of healthy eating habits, regular aerobic exercise (at least 150 minutes a week as tolerated) and medication compliance to achieve or maintain control of diabetes. Discuss complications that can occur from uncontrolled DM Ideally would have liked to start her on Metformin but given hx of CHF and Crohn's, will add Januvia instead to [place] which she is on for CHF.  BS goals written down Check BS once a day before BF.  Bring readings on f/u visit - POCT glucose (manual entry) - POCT glycosylated hemoglobin (Hb A1C) - sitaGLIPtin (JANUVIA) 50 MG tablet; Take 1 tablet (50 mg total) by mouth daily.  Dispense: 30 tablet; Refill: 1 -  Ambulatory referral to Ophthalmology - glucose blood (ACCU-CHEK GUIDE) test strip; Use as instructed to check blood sugar daily before breakfast  Dispense: 100 each; Refill: 12 - Accu-Chek Softclix Lancets lancets; Use as instructed.  Check blood sugar daily before breakfast.  Dispense: 100 each; Refill: 12 - Blood Glucose Monitoring Suppl (ACCU-CHEK GUIDE) w/Device KIT; Check blood sugar once daily before breakfast  Dispense: 1 kit; Refill: 0  2. Injury of great toenail Keep appt with Podiatry on Monday  3. Hypokalemia Due to diuretics Continue K+ supplement as as 140 meq BID.  Return for lab check on Monday.  Lab closed today due to computer outage. - Basic Metabolic Panel; Future     Patient was given the opportunity to ask questions.  Patient verbalized understanding of the plan and was able to repeat key elements of the plan.   This documentation was completed using Paediatric nurse.  Any transcriptional errors are unintentional.  No orders of the defined types were placed in this encounter.    Requested Prescriptions    No prescriptions requested or ordered in this encounter    No follow-ups on file.  Jonah Blue, MD, FACP

## 2022-11-05 ENCOUNTER — Other Ambulatory Visit: Payer: Self-pay | Admitting: Internal Medicine

## 2022-11-05 ENCOUNTER — Other Ambulatory Visit (HOSPITAL_COMMUNITY): Payer: Self-pay

## 2022-11-05 ENCOUNTER — Ambulatory Visit: Payer: Medicaid Other | Attending: Internal Medicine

## 2022-11-05 ENCOUNTER — Ambulatory Visit: Payer: Medicaid Other

## 2022-11-05 ENCOUNTER — Other Ambulatory Visit (HOSPITAL_COMMUNITY): Payer: Self-pay | Admitting: Internal Medicine

## 2022-11-05 DIAGNOSIS — L600 Ingrowing nail: Secondary | ICD-10-CM | POA: Diagnosis not present

## 2022-11-05 DIAGNOSIS — E876 Hypokalemia: Secondary | ICD-10-CM | POA: Diagnosis not present

## 2022-11-05 DIAGNOSIS — L601 Onycholysis: Secondary | ICD-10-CM | POA: Diagnosis not present

## 2022-11-05 DIAGNOSIS — M792 Neuralgia and neuritis, unspecified: Secondary | ICD-10-CM | POA: Diagnosis not present

## 2022-11-05 MED ORDER — APIXABAN 5 MG PO TABS
5.0000 mg | ORAL_TABLET | Freq: Two times a day (BID) | ORAL | 11 refills | Status: DC
  Filled 2022-11-05: qty 60, 30d supply, fill #0
  Filled 2022-12-10: qty 60, 30d supply, fill #1
  Filled 2023-01-22: qty 60, 30d supply, fill #2
  Filled 2023-02-21: qty 60, 30d supply, fill #3
  Filled 2023-03-24: qty 60, 30d supply, fill #4
  Filled 2023-04-23: qty 60, 30d supply, fill #5
  Filled 2023-05-20: qty 60, 30d supply, fill #6
  Filled 2023-06-24: qty 60, 30d supply, fill #7
  Filled 2023-07-19: qty 60, 30d supply, fill #8
  Filled 2023-08-12 – 2023-08-16 (×2): qty 60, 30d supply, fill #0
  Filled 2023-09-26: qty 60, 30d supply, fill #1
  Filled 2023-10-22: qty 60, 30d supply, fill #2

## 2022-11-06 ENCOUNTER — Encounter: Payer: Self-pay | Admitting: Internal Medicine

## 2022-11-06 ENCOUNTER — Other Ambulatory Visit: Payer: Self-pay

## 2022-11-06 ENCOUNTER — Other Ambulatory Visit: Payer: Medicaid Other

## 2022-11-06 ENCOUNTER — Ambulatory Visit (INDEPENDENT_AMBULATORY_CARE_PROVIDER_SITE_OTHER): Payer: Medicaid Other | Admitting: Gastroenterology

## 2022-11-06 ENCOUNTER — Other Ambulatory Visit: Payer: Self-pay | Admitting: Internal Medicine

## 2022-11-06 DIAGNOSIS — E876 Hypokalemia: Secondary | ICD-10-CM

## 2022-11-06 DIAGNOSIS — Z23 Encounter for immunization: Secondary | ICD-10-CM

## 2022-11-06 DIAGNOSIS — K50119 Crohn's disease of large intestine with unspecified complications: Secondary | ICD-10-CM

## 2022-11-06 LAB — BASIC METABOLIC PANEL
BUN/Creatinine Ratio: 13 (ref 9–23)
BUN: 13 mg/dL (ref 6–24)
CO2: 30 mmol/L — ABNORMAL HIGH (ref 20–29)
Calcium: 10 mg/dL (ref 8.7–10.2)
Chloride: 91 mmol/L — ABNORMAL LOW (ref 96–106)
Creatinine, Ser: 0.98 mg/dL (ref 0.57–1.00)
Glucose: 104 mg/dL — ABNORMAL HIGH (ref 70–99)
Potassium: 3.3 mmol/L — ABNORMAL LOW (ref 3.5–5.2)
Sodium: 138 mmol/L (ref 134–144)
eGFR: 69 mL/min/{1.73_m2} (ref >59)

## 2022-11-06 NOTE — Progress Notes (Signed)
See nursing documentation for vaccine administration.

## 2022-11-07 ENCOUNTER — Other Ambulatory Visit: Payer: Self-pay

## 2022-11-07 ENCOUNTER — Telehealth: Payer: Self-pay

## 2022-11-07 ENCOUNTER — Other Ambulatory Visit (HOSPITAL_COMMUNITY): Payer: Self-pay

## 2022-11-07 NOTE — Telephone Encounter (Signed)
A prior authorization request for Januvia has been submitted to insurance today via CoverMyMeds Key: [id number]

## 2022-11-08 ENCOUNTER — Other Ambulatory Visit: Payer: Self-pay | Admitting: Internal Medicine

## 2022-11-08 ENCOUNTER — Ambulatory Visit (HOSPITAL_COMMUNITY)
Admission: RE | Admit: 2022-11-08 | Discharge: 2022-11-08 | Disposition: A | Discharge status: Home / Self Care | Payer: Medicaid Other | Source: Ambulatory Visit | Attending: Gastroenterology | Admitting: Gastroenterology

## 2022-11-08 ENCOUNTER — Other Ambulatory Visit (HOSPITAL_COMMUNITY): Payer: Self-pay

## 2022-11-08 DIAGNOSIS — K5090 Crohn's disease, unspecified, without complications: Secondary | ICD-10-CM | POA: Insufficient documentation

## 2022-11-08 DIAGNOSIS — R232 Flushing: Secondary | ICD-10-CM

## 2022-11-08 MED ORDER — IVABRADINE HCL 5 MG PO TABS
5.0000 mg | ORAL_TABLET | Freq: Two times a day (BID) | ORAL | 2 refills | Status: DC
  Filled 2022-11-08: qty 60, 30d supply, fill #0
  Filled 2022-12-10: qty 60, 30d supply, fill #1

## 2022-11-08 MED ORDER — VEDOLIZUMAB 300 MG IV SOLR
300.0000 mg | INTRAVENOUS | Status: DC
  Administered 2022-11-08: 300 mg via INTRAVENOUS
  Filled 2022-11-08: qty 5

## 2022-11-08 MED ORDER — VEOZAH 45 MG PO TABS
45.0000 mg | ORAL_TABLET | Freq: Every day | ORAL | 2 refills | Status: DC
Start: 2022-11-08 — End: 2023-02-01
  Filled 2022-11-08: qty 30, 30d supply, fill #0
  Filled 2022-11-30 – 2022-12-06 (×2): qty 30, 30d supply, fill #1
  Filled 2023-01-03: qty 30, 30d supply, fill #2

## 2022-11-08 NOTE — Progress Notes (Signed)
Remote ICD transmission.   

## 2022-11-09 ENCOUNTER — Emergency Department (HOSPITAL_BASED_OUTPATIENT_CLINIC_OR_DEPARTMENT_OTHER): Payer: Medicaid Other

## 2022-11-09 ENCOUNTER — Other Ambulatory Visit: Payer: Self-pay

## 2022-11-09 ENCOUNTER — Emergency Department (HOSPITAL_BASED_OUTPATIENT_CLINIC_OR_DEPARTMENT_OTHER)
Admission: EM | Admit: 2022-11-09 | Discharge: 2022-11-09 | Disposition: A | Discharge status: Home / Self Care | Payer: Medicaid Other | Source: Home / Self Care | Attending: Emergency Medicine | Admitting: Emergency Medicine

## 2022-11-09 ENCOUNTER — Other Ambulatory Visit (HOSPITAL_COMMUNITY): Payer: Self-pay

## 2022-11-09 ENCOUNTER — Ambulatory Visit: Payer: Self-pay | Admitting: *Deleted

## 2022-11-09 ENCOUNTER — Encounter (HOSPITAL_BASED_OUTPATIENT_CLINIC_OR_DEPARTMENT_OTHER): Payer: Self-pay

## 2022-11-09 DIAGNOSIS — I5042 Chronic combined systolic (congestive) and diastolic (congestive) heart failure: Secondary | ICD-10-CM | POA: Diagnosis not present

## 2022-11-09 DIAGNOSIS — I7143 Infrarenal abdominal aortic aneurysm, without rupture: Secondary | ICD-10-CM | POA: Diagnosis not present

## 2022-11-09 DIAGNOSIS — D72829 Elevated white blood cell count, unspecified: Secondary | ICD-10-CM | POA: Insufficient documentation

## 2022-11-09 DIAGNOSIS — Z79899 Other long term (current) drug therapy: Secondary | ICD-10-CM | POA: Diagnosis not present

## 2022-11-09 DIAGNOSIS — K50019 Crohn's disease of small intestine with unspecified complications: Secondary | ICD-10-CM | POA: Diagnosis not present

## 2022-11-09 DIAGNOSIS — K5090 Crohn's disease, unspecified, without complications: Secondary | ICD-10-CM | POA: Diagnosis not present

## 2022-11-09 DIAGNOSIS — I2510 Atherosclerotic heart disease of native coronary artery without angina pectoris: Secondary | ICD-10-CM | POA: Diagnosis not present

## 2022-11-09 DIAGNOSIS — Z7901 Long term (current) use of anticoagulants: Secondary | ICD-10-CM | POA: Insufficient documentation

## 2022-11-09 DIAGNOSIS — R197 Diarrhea, unspecified: Secondary | ICD-10-CM | POA: Diagnosis present

## 2022-11-09 DIAGNOSIS — R1031 Right lower quadrant pain: Secondary | ICD-10-CM | POA: Diagnosis not present

## 2022-11-09 LAB — COMPREHENSIVE METABOLIC PANEL
ALT: 29 U/L (ref 0–44)
AST: 25 U/L (ref 15–41)
Albumin: 4.9 g/dL (ref 3.5–5.0)
Alkaline Phosphatase: 116 U/L (ref 38–126)
Anion gap: 12 (ref 5–15)
BUN: 21 mg/dL — ABNORMAL HIGH (ref 6–20)
CO2: 30 mmol/L (ref 22–32)
Calcium: 10.1 mg/dL (ref 8.9–10.3)
Chloride: 94 mmol/L — ABNORMAL LOW (ref 98–111)
Creatinine, Ser: 1.1 mg/dL — ABNORMAL HIGH (ref 0.44–1.00)
GFR, Estimated: 60 mL/min — ABNORMAL LOW (ref >60)
Glucose, Bld: 91 mg/dL (ref 70–99)
Potassium: 3.3 mmol/L — ABNORMAL LOW (ref 3.5–5.1)
Sodium: 136 mmol/L (ref 135–145)
Total Bilirubin: 0.5 mg/dL (ref 0.3–1.2)
Total Protein: 8.6 g/dL — ABNORMAL HIGH (ref 6.5–8.1)

## 2022-11-09 LAB — CBC WITH DIFFERENTIAL/PLATELET
Abs Immature Granulocytes: 0.03 10*3/uL (ref 0.00–0.07)
Basophils Absolute: 0.1 10*3/uL (ref 0.0–0.1)
Basophils Relative: 1 %
Eosinophils Absolute: 0.6 10*3/uL — ABNORMAL HIGH (ref 0.0–0.5)
Eosinophils Relative: 5 %
HCT: 46.8 % — ABNORMAL HIGH (ref 36.0–46.0)
Hemoglobin: 15.9 g/dL — ABNORMAL HIGH (ref 12.0–15.0)
Immature Granulocytes: 0 %
Lymphocytes Relative: 22 %
Lymphs Abs: 2.8 10*3/uL (ref 0.7–4.0)
MCH: 29.2 pg (ref 26.0–34.0)
MCHC: 34 g/dL (ref 30.0–36.0)
MCV: 85.9 fL (ref 80.0–100.0)
Monocytes Absolute: 0.8 10*3/uL (ref 0.1–1.0)
Monocytes Relative: 7 %
Neutro Abs: 8.2 10*3/uL — ABNORMAL HIGH (ref 1.7–7.7)
Neutrophils Relative %: 65 %
Platelets: 404 10*3/uL — ABNORMAL HIGH (ref 150–400)
RBC: 5.45 MIL/uL — ABNORMAL HIGH (ref 3.87–5.11)
RDW: 13 % (ref 11.5–15.5)
WBC: 12.6 10*3/uL — ABNORMAL HIGH (ref 4.0–10.5)
nRBC: 0 % (ref 0.0–0.2)

## 2022-11-09 LAB — URINALYSIS, ROUTINE W REFLEX MICROSCOPIC
Bacteria, UA: NONE SEEN
Bilirubin Urine: NEGATIVE
Glucose, UA: 500 mg/dL — AB
Hgb urine dipstick: NEGATIVE
Ketones, ur: NEGATIVE mg/dL
Leukocytes,Ua: NEGATIVE
Nitrite: NEGATIVE
Protein, ur: NEGATIVE mg/dL
Specific Gravity, Urine: 1.006 (ref 1.005–1.030)
pH: 5 (ref 5.0–8.0)

## 2022-11-09 LAB — LIPASE, BLOOD: Lipase: 86 U/L — ABNORMAL HIGH (ref 11–51)

## 2022-11-09 MED ORDER — PREDNISONE 10 MG (21) PO TBPK: ORAL_TABLET | Freq: Every day | ORAL | 0 refills | Status: DC

## 2022-11-09 MED ORDER — IOHEXOL 300 MG/ML  SOLN
100.0000 mL | Freq: Once | INTRAMUSCULAR | Status: AC | PRN
  Administered 2022-11-09: 100 mL via INTRAVENOUS

## 2022-11-09 MED ORDER — POTASSIUM CHLORIDE CRYS ER 20 MEQ PO TBCR
40.0000 meq | EXTENDED_RELEASE_TABLET | Freq: Once | ORAL | Status: AC
  Administered 2022-11-09: 40 meq via ORAL
  Filled 2022-11-09: qty 2

## 2022-11-09 MED ORDER — PREDNISONE 10 MG (21) PO TBPK
ORAL_TABLET | Freq: Every day | ORAL | 0 refills | Status: DC
  Filled 2022-11-09: qty 42, fill #0

## 2022-11-09 NOTE — Telephone Encounter (Signed)
Spoke with patient . Verified name & DOB    Patient voiced that she was still have s/s. Advised patient that there are no open appointments today and our MU is not operating. Advised that she got to UC to be evaluated. Patient  agreed.

## 2022-11-09 NOTE — ED Provider Notes (Signed)
[name] Provider Note   CSN: [id number] Arrival date & time: 11/09/22  1727     History  Chief Complaint  Patient presents with   Diarrhea    Christine [name] is a 57 y.o. female with a history of CAD, CHF, hyperlipidemia presents today for evaluation of diarrhea.  Patient states she has had diarrhea for about 10 days.  Today the diarrhea got worse.  Patient says that she lost 4 pounds since this morning.  Patient stated she also has lower abdominal pain and cramping.  She denies fever, chest pain, shortness of breath, constipation, blood in her stool or urine.  No history of abdominal surgery.  States that she just started taking Januvia for diabetes today.  States that in the last week her primary care physician also increase her potassium due to hypokalemia.   Diarrhea     Past Medical History:  Diagnosis Date   Allergy    Anxiety 01/20/2021   CAD in native artery 08/24/2021   CHF (congestive heart failure) (HCC)    Chronic combined systolic and diastolic heart failure (HCC) 08/24/2021   Complication of anesthesia    Depression    Hyperlipidemia    Myocardial infarction [facility])    Presence of cardiac defibrillator 07/2021   Shortness of breath 01/20/2021   Sleep apnea 07/2021   Tobacco abuse 01/20/2021   Past Surgical History:  Procedure Laterality Date   ABDOMINAL HYSTERECTOMY     BIOPSY  02/26/2022   Procedure: BIOPSY;  Surgeon: Shellia Cleverly, DO;  Location: [facility] ENDOSCOPY;  Service: Gastroenterology;;   CARDIAC CATHETERIZATION     CHOLECYSTECTOMY     COLONOSCOPY WITH PROPOFOL N/A 02/26/2022   Procedure: COLONOSCOPY WITH PROPOFOL;  Surgeon: Shellia Cleverly, DO;  Location: [facility] ENDOSCOPY;  Service: Gastroenterology;  Laterality: N/A;   ESOPHAGOGASTRODUODENOSCOPY (EGD) WITH PROPOFOL N/A 02/26/2022   Procedure: ESOPHAGOGASTRODUODENOSCOPY (EGD) WITH PROPOFOL;  Surgeon: Shellia Cleverly, DO;  Location: [facility] ENDOSCOPY;   Service: Gastroenterology;  Laterality: N/A;   ICD IMPLANT N/A 07/21/2021   Procedure: ICD IMPLANT;  Surgeon: Duke Salvia, MD;  Location: [facility] INVASIVE CV LAB;  Service: Cardiovascular;  Laterality: N/A;   NECK SURGERY     OVARIAN CYST SURGERY     POLYPECTOMY  02/26/2022   Procedure: POLYPECTOMY;  Surgeon: Shellia Cleverly, DO;  Location: [facility] ENDOSCOPY;  Service: Gastroenterology;;   RIGHT/LEFT HEART CATH AND CORONARY ANGIOGRAPHY N/A 01/23/2021   Procedure: RIGHT/LEFT HEART CATH AND CORONARY ANGIOGRAPHY;  Surgeon: Dolores Patty, MD;  Location: [facility] INVASIVE CV LAB;  Service: Cardiovascular;  Laterality: N/A;     Home Medications Prior to Admission medications   Medication Sig Start Date End Date Taking? Authorizing Provider  predniSONE (STERAPRED UNI-PAK 21 TAB) 10 MG (21) TBPK tablet Take by mouth daily. Take 6 tabs by mouth daily  for 2 days, then 5 tabs for 2 days, then 4 tabs for 2 days, then 3 tabs for 2 days, 2 tabs for 2 days, then 1 tab by mouth daily for 2 days 11/09/22  Yes Jeanelle Malling, PA  Accu-Chek Softclix Lancets lancets Use as instructed.  Check blood sugar daily before breakfast. 11/02/22   Marcine Matar, MD  acetaminophen (TYLENOL) 500 MG tablet Take 1,000 mg by mouth 3 (three) times daily as needed for moderate pain or headache.    [provider]  ALPRAZolam Prudy Feeler) 1 MG tablet Take 2 mg by mouth 3 (three) times daily.  [provider]  alum & mag hydroxide-simeth (MAALOX/MYLANTA) 200-200-20 MG/5ML suspension Take 30 mLs by mouth every 4 (four) hours as needed for indigestion or heartburn. 03/02/22   Atway, Rayann N, DO  apixaban (ELIQUIS) 5 MG TABS tablet Take 1 tablet (5 mg total) by mouth 2 (two) times daily. 11/05/22   Bensimhon, Bevelyn Buckles, MD  atorvastatin (LIPITOR) 80 MG tablet Take 1 tablet (80 mg total) by mouth daily. 01/19/22   Laurey Morale, MD  Blood Glucose Monitoring Suppl (ACCU-CHEK GUIDE) w/Device KIT Check blood sugar once daily before  breakfast 11/02/22   Marcine Matar, MD  buPROPion (WELLBUTRIN XL) 150 MG 24 hr tablet Take 300 mg by mouth daily.    [provider]  cyclobenzaprine (FLEXERIL) 10 MG tablet Take 20 mg by mouth at bedtime.    [provider]  dapagliflozin propanediol (FARXIGA) 10 MG TABS tablet Take 1 tablet (10 mg total) by mouth daily. 10/23/22   Bensimhon, Bevelyn Buckles, MD  diphenhydrAMINE (BENADRYL) 25 MG tablet Take 50 mg by mouth as needed for allergies.    [provider]  diphenoxylate-atropine (LOMOTIL) 2.5-0.025 MG tablet Take 2 tablets by mouth 4 (four) times daily as needed for diarrhea/loose stools. 08/08/22   Cirigliano, Vito V, DO  feeding supplement (ENSURE ENLIVE / ENSURE PLUS) LIQD Take 237 mLs by mouth 2 (two) times daily between meals. 03/03/22   Atway, Rayann N, DO  Fezolinetant (VEOZAH) 45 MG TABS Take 1 tablet (45 mg total) by mouth daily. 11/08/22   Marcine Matar, MD  furosemide (LASIX) 40 MG tablet Take 2 tablets (80 mg total) by mouth in the morning and every evening 08/22/22   Bensimhon, Bevelyn Buckles, MD  glucose blood (ACCU-CHEK GUIDE) test strip Use as instructed to check blood sugar daily before breakfast 11/02/22   Marcine Matar, MD  ivabradine (CORLANOR) 5 MG TABS tablet Take 1 tablet (5 mg total) by mouth 2 (two) times daily. 11/08/22   Marcine Matar, MD  lipase/protease/amylase (CREON) 36000 UNITS CPEP capsule Take 2 capsules ( 72000 units) with meals and 1 capsule ( 36000 units) with snack. Patient taking differently: 36,000 Units with breakfast, with lunch, and with evening meal. Take 1 tablet before breakfast 03/13/22   Cirigliano, Vito V, DO  metolazone (ZAROXOLYN) 2.5 MG tablet Take 1 tablet (2.5 mg total) by mouth as directed by [facility] 10/09/22   Jacklynn Ganong, FNP  metoprolol succinate (TOPROL-XL) 25 MG 24 hr tablet Take 1 tablet (25 mg total) by mouth daily. Take with or immediately following a meal. 07/05/22   Bensimhon,  Bevelyn Buckles, MD  metroNIDAZOLE (METROGEL) 1 % gel Apply topically as needed.    [provider]  Multiple Vitamins-Minerals (MULTIVITAMIN WITH MINERALS) tablet Take 1 tablet by mouth daily.    [provider]  oxymetazoline (AFRIN) 0.05 % nasal spray Place 1-2 sprays into both nostrils 2 (two) times daily as needed for congestion.    [provider]  permethrin (ELIMITE) 5 % cream Apply from neck down before bed then wash off in the morning. May repeat in one week. 10/31/22   Mardella Layman, MD  potassium chloride (KLOR-CON) 10 MEQ tablet Take 10 tablets (100 mEq total) by mouth 2 (two) times daily. 09/25/22   Bensimhon, Bevelyn Buckles, MD  potassium chloride (KLOR-CON) 10 MEQ tablet Take 4 tablets (40 mEq total) by mouth 2 (two) times daily for 2 days. 10/31/22 11/07/22  Small, Harley Alto, PA  promethazine (PHENERGAN) 25 MG tablet Take 1 tablet (25 mg total) by mouth every 6 (six) to 8 (eight) hours for nausea or vomiting. 09/06/22   Cirigliano, Vito V, DO  sitaGLIPtin (JANUVIA) 50 MG tablet Take 1 tablet (50 mg total) by mouth daily. 11/02/22   Marcine Matar, MD  spironolactone (ALDACTONE) 25 MG tablet Take 1 tablet (25 mg total) by mouth daily. 06/18/22   Bensimhon, Bevelyn Buckles, MD  zolpidem (AMBIEN) 10 MG tablet Take 5-10 mg by mouth at bedtime.    [provider]      Allergies    Sulfa antibiotics, Erythromycin, Tramadol, Ibuprofen, and Tape    Review of Systems   Review of Systems  Gastrointestinal:  Positive for diarrhea.    Physical Exam Updated Vital Signs BP (!) 108/91   Pulse 91   Temp 98 F (36.7 C)   Resp 16   SpO2 99%  Physical Exam Vitals and nursing note reviewed.  Constitutional:      Appearance: Normal appearance.  HENT:     Head: Normocephalic and atraumatic.     Mouth/Throat:     Mouth: Mucous membranes are moist.  Eyes:     General: No scleral icterus. Cardiovascular:     Rate and Rhythm: Normal rate and regular rhythm.     Pulses:  Normal pulses.     Heart sounds: Normal heart sounds.  Pulmonary:     Effort: Pulmonary effort is normal.     Breath sounds: Normal breath sounds.  Abdominal:     General: Abdomen is flat.     Palpations: Abdomen is soft.     Tenderness: There is abdominal tenderness in the right lower quadrant, suprapubic area and left lower quadrant.  Musculoskeletal:        General: No deformity.  Skin:    General: Skin is warm.     Findings: No rash.  Neurological:     General: No focal deficit present.     Mental Status: She is alert.  Psychiatric:        Mood and Affect: Mood normal.     ED Results / Procedures / Treatments   Labs (all labs ordered are listed, but only abnormal results are displayed) Labs Reviewed  COMPREHENSIVE METABOLIC PANEL - Abnormal; Notable for the following components:      Result Value   Potassium 3.3 (*)    Chloride 94 (*)    BUN 21 (*)    Creatinine, Ser 1.10 (*)    Total Protein 8.6 (*)    GFR, Estimated 60 (*)    All other components within normal limits  LIPASE, BLOOD - Abnormal; Notable for the following components:   Lipase 86 (*)    All other components within normal limits  CBC WITH DIFFERENTIAL/PLATELET - Abnormal; Notable for the following components:   WBC 12.6 (*)    RBC 5.45 (*)    Hemoglobin 15.9 (*)    HCT 46.8 (*)    Platelets 404 (*)    Neutro Abs 8.2 (*)    Eosinophils Absolute 0.6 (*)    All other components within normal limits  URINALYSIS, ROUTINE W REFLEX MICROSCOPIC - Abnormal; Notable for the following components:   Color, Urine COLORLESS (*)    Glucose, UA 500 (*)    All other components within normal limits    EKG None  Radiology CT ABDOMEN PELVIS W CONTRAST  Result Date: 11/09/2022 CLINICAL DATA:  Right lower quadrant abdominal pain. History of Crohn's disease.  Diarrhea. EXAM: CT ABDOMEN AND PELVIS WITH CONTRAST TECHNIQUE: Multidetector CT imaging of the abdomen and pelvis was performed using the standard protocol  following bolus administration of intravenous contrast. RADIATION DOSE REDUCTION: This exam was performed according to the departmental dose-optimization program which includes automated exposure control, adjustment of the mA and/or kV according to patient size and/or use of iterative reconstruction technique. CONTRAST:  OMNIPAQUE IOHEXOL 300 MG/ML  SOLN COMPARISON:  CT enterography 07/18/2022 FINDINGS: Lower chest: No acute abnormality. Hepatobiliary: Unremarkable liver and biliary tree. Cholecystectomy. Pancreas: Unremarkable. Spleen: Unremarkable. Adrenals/Urinary Tract: Stable adrenal glands. No urinary calculi or hydronephrosis. Unremarkable bladder. Stomach/Bowel: Normal caliber large and small bowel. Fecalization of the distal ileum compatible with slow transit. There is very mild wall thickening and mucosal hyperenhancement of the distal ileum (circa series 5/image 46). Normal appendix. Stomach is within normal limits. Vascular/Lymphatic: Aortic atherosclerosis. Infrarenal abdominal aortic aneurysm measuring 32 mm. No enlarged abdominal or pelvic lymph nodes. Reproductive: Hysterectomy. Other: No free intraperitoneal fluid or air. Musculoskeletal: No acute fracture. IMPRESSION: 1. Very mild wall thickening and mucosal hyperenhancement of the distal ileum compatible with mild active Crohn's disease. 2. Fecalization of the distal ileum compatible with slow transit. 3. Infrarenal abdominal aortic aneurysm measuring 32 mm. Recommend follow-up ultrasound every 3 years. This recommendation follows ACR consensus guidelines: White Paper of the ACR Incidental Findings Committee II on Vascular Findings. J Am Coll Radiol 2013; 10:789-794. Aortic Atherosclerosis (ICD10-I70.0). Electronically Signed   By: Minerva Fester M.D.   On: 11/09/2022 20:05    Procedures Procedures    Medications Ordered in ED Medications  potassium chloride SA (KLOR-CON M) CR tablet 40 mEq (40 mEq Oral Given 11/09/22 1935)  iohexol  (OMNIPAQUE) 300 MG/ML solution 100 mL (100 mLs Intravenous Contrast Given 11/09/22 1947)    ED Course/ Medical Decision Making/ A&P                             Medical Decision Making Amount and/or Complexity of Data Reviewed Labs: ordered. Radiology: ordered.  Risk Prescription drug management.   This patient presents to the ED for abdominal pain, diarrhea, this involves an extensive number of treatment options, and is a complaint that carries with a high risk of complications and morbidity.  The differential diagnosis includes diverticulitis, hernia, diverticulitis, UTI, constipation, ectopic pregnancy, ovarian torsion, ovarian cyst, PID/TOA, period/fibroid.  This is not an exhaustive list.  Lab tests: I ordered and personally interpreted labs.  The pertinent results include: WBC 12.6. Hbg unremarkable. Platelets unremarkable. Electrolytes significant for potassium 3.3. BUN, creatinine unremarkable.  Lipase 86.  Imaging studies: I ordered imaging studies, personally reviewed, interpreted imaging and agree with the radiologist's interpretations. The results include: CT abdomen pelvis showed mild active Crohn's disease.  Problem list/ ED course/ Critical interventions/ Medical management: HPI: See above Vital signs within normal range and stable throughout visit. Laboratory/imaging studies significant for: See above. On physical examination, patient is afebrile and appears in no acute distress.  There was tenderness to palpation to the suprapubic area and right lower quadrant.  CBC with leukocytosis 12.6.  No evidence of anemia.  CMP with no significant acute electrolyte normalities.  CT abdomen pelvis showed mild active Crohn's disease.  Presentations are not consistent with other intraabdominal emergencies.  Likely a side effect of her antidiabetic medications.  Patient is able to tolerate p.o. here.  I will send a short course of steroid for Crohn's disease flare.  Advised  patient to  follow-up with GI for reevaluation and management.  Strict return precaution discussed. I have reviewed the patient home medicines and have made adjustments as needed.  Cardiac monitoring/EKG: The patient was maintained on a cardiac monitor.  I personally reviewed and interpreted the cardiac monitor which showed an underlying rhythm of: sinus rhythm.  Additional history obtained: External records from outside source obtained and reviewed including: Chart review including previous notes, labs, imaging.  Consultations obtained:  Disposition Continued outpatient therapy. Follow-up with PCP recommended for reevaluation of symptoms. Treatment plan discussed with patient.  Pt acknowledged understanding was agreeable to the plan. Worrisome signs and symptoms were discussed with patient, and patient acknowledged understanding to return to the ED if they noticed these signs and symptoms. Patient was stable upon discharge.   This chart was dictated using voice recognition software.  Despite best efforts to proofread,  errors can occur which can change the documentation meaning.          Final Clinical Impression(s) / ED Diagnoses Final diagnoses:  Crohn's disease of small intestine with complication (HCC)    Rx / DC Orders ED Discharge Orders          Ordered    predniSONE (STERAPRED UNI-PAK 21 TAB) 10 MG (21) TBPK tablet  Daily        11/09/22 2043              Jeanelle Malling, [place] 11/09/22 2055    Sloan Leiter, DO 11/11/22 0028

## 2022-11-09 NOTE — Telephone Encounter (Signed)
Summary: Diarrhea   Pt is calling in because she says this morning she has had diarrhea so bad she lost four pounds. Pt says she has issues with low potassium, crohns, and heart failure and would like a nurse to call her back regarding this.       Chief Complaint: diarrhea today lost 4 lbs today ,  Symptoms: diarrhea this am "massive diarrheal " episode. And last time at 1045 today . Incontinent hx crohns and partial bowel obstruction. Started [place] today blood glucose was 111 at 0700. Extreme thirst now. Did take lasix  80 mg this am prior to diarrhea starting. Has taken lomotil x 4 and not effective per patient.  Frequency: today Pertinent Negatives: Patient denies chest pain no difficulty breathing. No dizziness no headache. No cramps in legs  Disposition: [] ED /[] Urgent Care (no appt availability in office) / [] Appointment(In office/virtual)/ []  [place] [facility]/ [] Home Care/ [] Refused Recommended Disposition /[] [place] [facility]/ [x]  Follow-up with PCP Additional Notes:   No available appt today . Please advise if patient should continue taking lasix over the weekend and if potassium should be checked before the weekend due to the diarrhea episode this am . Recommended patient to recheck blood glucose . Recommended if no call back from practice to go to UC or ED for worsening sx.  See previous ED visit and low potassium issues.      Reason for Disposition  [1] SEVERE diarrhea (e.g., 7 or more times / day more than normal) AND [2] present > 24 hours (1 day)    Hx crohns and 2 episodes of "massive diarrhea"  Answer Assessment - Initial Assessment Questions 1. DIARRHEA SEVERITY: "How bad is the diarrhea?" "How many more stools have you had in the past 24 hours than normal?"    - NO DIARRHEA (SCALE 0)   - MILD (SCALE 1-3): Few loose or mushy BMs; increase of 1-3 stools over normal daily number of stools; mild increase in ostomy output.   -  MODERATE (SCALE 4-7): Increase of  4-6 stools daily over normal; moderate increase in ostomy output.   -  SEVERE (SCALE 8-10; OR "WORST POSSIBLE"): Increase of 7 or more stools daily over normal; moderate increase in ostomy output; incontinence.     Diarrhea today and has lost 4 lbs, did take lasix today and last episode diarrhea was at 1045 am  2. ONSET: "When did the diarrhea begin?"      This am  3. BM CONSISTENCY: "How loose or watery is the diarrhea?"      Massive diarrhea incontinent at times hx crohns 4. VOMITING: "Are you also vomiting?" If Yes, ask: "How many times in the past 24 hours?"      na 5. ABDOMEN PAIN: "Are you having any abdomen pain?" If Yes, ask: "What does it feel like?" (e.g., crampy, dull, intermittent, constant)      na 6. ABDOMEN PAIN SEVERITY: If present, ask: "How bad is the pain?"  (e.g., Scale 1-10; mild, moderate, or severe)   - MILD (1-3): doesn't interfere with normal activities, abdomen soft and not tender to touch    - MODERATE (4-7): interferes with normal activities or awakens from sleep, abdomen tender to touch    - SEVERE (8-10): excruciating pain, doubled over, unable to do any normal activities       na 7. ORAL INTAKE: If vomiting, "Have you been able to drink liquids?" "How much liquids have you had in the past 24 hours?"  No vomiting . Limiting intake due to heart issues. 8. HYDRATION: "Any signs of dehydration?" (e.g., dry mouth [not just dry lips], too weak to stand, dizziness, new weight loss) "When did you last urinate?"     Denies dizziness  9. EXPOSURE: "Have you traveled to a foreign country recently?" "Have you been exposed to anyone with diarrhea?" "Could you have eaten any food that was spoiled?"     na 10. ANTIBIOTIC USE: "Are you taking antibiotics now or have you taken antibiotics in the past 2 months?"       na 11. OTHER SYMPTOMS: "Do you have any other symptoms?" (e.g., fever, blood in stool)       Diarrheal episodes. Today started [place] today blood glucose 111 at  0700. Took lasix today . Reports thirsty. Potassium has been low and taking supplement  12. PREGNANCY: "Is there any chance you are pregnant?" "When was your last menstrual period?"       na  Protocols used: [facility]

## 2022-11-09 NOTE — Discharge Instructions (Addendum)
Please take your medications as prescribed. I recommend close follow-up with GI for reevaluation.  Please do not hesitate to return to emergency department if worrisome signs symptoms we discussed become apparent.

## 2022-11-09 NOTE — ED Triage Notes (Signed)
Pt reports a hx of crohn's disease and reports diarrhea X 1 week which worsened today. Also report muscle cramp and fells her K+ might be low.

## 2022-11-09 NOTE — ED Notes (Signed)
Patient transported to CT 

## 2022-11-10 ENCOUNTER — Other Ambulatory Visit (HOSPITAL_COMMUNITY): Payer: Self-pay

## 2022-11-12 ENCOUNTER — Encounter (HOSPITAL_COMMUNITY): Payer: Self-pay | Admitting: Pharmacist

## 2022-11-12 ENCOUNTER — Other Ambulatory Visit (HOSPITAL_COMMUNITY): Payer: Self-pay

## 2022-11-12 ENCOUNTER — Other Ambulatory Visit: Payer: Self-pay

## 2022-11-12 ENCOUNTER — Encounter: Payer: Self-pay | Admitting: Gastroenterology

## 2022-11-12 DIAGNOSIS — K50119 Crohn's disease of large intestine with unspecified complications: Secondary | ICD-10-CM

## 2022-11-13 ENCOUNTER — Other Ambulatory Visit: Payer: Self-pay | Admitting: Internal Medicine

## 2022-11-13 ENCOUNTER — Other Ambulatory Visit (HOSPITAL_COMMUNITY): Payer: Self-pay

## 2022-11-13 DIAGNOSIS — E876 Hypokalemia: Secondary | ICD-10-CM

## 2022-11-13 MED ORDER — POTASSIUM CHLORIDE CRYS ER 20 MEQ PO TBCR
160.0000 meq | EXTENDED_RELEASE_TABLET | Freq: Two times a day (BID) | ORAL | 6 refills | Status: DC
Start: 2022-11-13 — End: 2022-11-20
  Filled 2022-11-13: qty 480, 30d supply, fill #0

## 2022-11-14 ENCOUNTER — Telehealth (HOSPITAL_COMMUNITY): Payer: Self-pay

## 2022-11-14 ENCOUNTER — Ambulatory Visit: Payer: Medicaid Other | Admitting: Podiatry

## 2022-11-14 NOTE — Telephone Encounter (Signed)
Received call onto voicemail from patient who states that she is having a punch biopsy performed on 11/26/22 on lesions on her arms and legs and was told by dermatology that she would have to be off the eliquis for 5 days for this. Patient also states that she will have stitches most likely. Patient would like to know if this is safe from a cardiology perspective. Advised patient I would forward message over to provider. Patient verbalized understanding.

## 2022-11-15 ENCOUNTER — Ambulatory Visit: Payer: Medicaid Other | Attending: Internal Medicine

## 2022-11-15 ENCOUNTER — Other Ambulatory Visit (HOSPITAL_COMMUNITY): Payer: Self-pay

## 2022-11-15 DIAGNOSIS — E876 Hypokalemia: Secondary | ICD-10-CM

## 2022-11-15 DIAGNOSIS — Z419 Encounter for procedure for purposes other than remedying health state, unspecified: Secondary | ICD-10-CM | POA: Diagnosis not present

## 2022-11-15 NOTE — Telephone Encounter (Signed)
Spoke with patient- reports that she called the derm office and she left a message with the MA requesting surgical clearance to be sent over. Patient has not heard back from them regarding this though.  Dr. Rocky Link is the dr performing procedure. Number to their office- [phone]  Called office and left message with our fax number- representative on the phone with dermatology states that she will have the clearance faxed to the office.   Advised patient I would call and request clearance as well. Patient aware and verbalized understanding.

## 2022-11-15 NOTE — Telephone Encounter (Signed)
Patient unsure of what the dermatology office number is. Provided patient with fax number to call and have them fax [place] a clearance form. Patient will also call back with number to derm as well.

## 2022-11-16 ENCOUNTER — Other Ambulatory Visit: Payer: Self-pay | Admitting: Gastroenterology

## 2022-11-16 ENCOUNTER — Other Ambulatory Visit: Payer: Self-pay

## 2022-11-18 ENCOUNTER — Other Ambulatory Visit: Payer: Self-pay | Admitting: Gastroenterology

## 2022-11-19 ENCOUNTER — Other Ambulatory Visit (HOSPITAL_COMMUNITY): Payer: Self-pay

## 2022-11-19 ENCOUNTER — Other Ambulatory Visit (HOSPITAL_COMMUNITY): Payer: Self-pay | Admitting: Family Medicine

## 2022-11-19 MED ORDER — DIPHENOXYLATE-ATROPINE 2.5-0.025 MG PO TABS
2.0000 | ORAL_TABLET | Freq: Four times a day (QID) | ORAL | 3 refills | Status: DC | PRN
Start: 2022-11-19 — End: 2023-08-05
  Filled 2022-11-19: qty 60, 8d supply, fill #0
  Filled 2023-01-02: qty 60, 8d supply, fill #1
  Filled 2023-02-01: qty 60, 8d supply, fill #2
  Filled 2023-04-22: qty 60, 8d supply, fill #3

## 2022-11-19 MED ORDER — DIPHENOXYLATE-ATROPINE 2.5-0.025 MG PO TABS: 2.0000 | ORAL_TABLET | Freq: Four times a day (QID) | ORAL | 3 refills | Status: DC | PRN

## 2022-11-19 NOTE — Telephone Encounter (Signed)
Lab orders/fecal calprotectin order in epic. Entyvio trough and antibody level reminder in epic - due on 12/27/22

## 2022-11-20 ENCOUNTER — Other Ambulatory Visit (HOSPITAL_COMMUNITY): Payer: Self-pay

## 2022-11-20 ENCOUNTER — Other Ambulatory Visit: Payer: Self-pay | Admitting: Pharmacist

## 2022-11-20 ENCOUNTER — Telehealth: Payer: Self-pay | Admitting: Internal Medicine

## 2022-11-20 MED ORDER — POTASSIUM CHLORIDE 40 MEQ/15ML (20%) PO SOLN
160.0000 meq | Freq: Two times a day (BID) | ORAL | 3 refills | Status: DC
Start: 2022-11-20 — End: 2022-12-09
  Filled 2022-11-20: qty 3311, 28d supply, fill #0

## 2022-11-20 MED ORDER — METOLAZONE 2.5 MG PO TABS
2.5000 mg | ORAL_TABLET | ORAL | 2 refills | Status: DC
  Filled 2022-11-20: qty 15, 15d supply, fill #0
  Filled 2022-12-06: qty 15, 15d supply, fill #1

## 2022-11-20 NOTE — Progress Notes (Signed)
Dr. Laural Benes,   Patient called requesting potassium to be switched from tablet to liquid form due to pill burden. Current rxn is for 160 mEq BID for a total daily dose of 320 mEq.   With the 29mEq/15ml stock bottle, we could write for #60 ml BID. I have pended and routed the order to your for review is appropriate.

## 2022-11-20 NOTE — Telephone Encounter (Signed)
Patient is aware Potassium Chloride 40 MEQ/15ML (20%) SOLN  that was sent to her pharmacy

## 2022-11-20 NOTE — Telephone Encounter (Signed)
Patient called in requesting provider to place an order for oral potassium as she can not swallow the pills that she has. Please f/u with patient. She uses the  [facility] Phone: [phone]  Fax: [phone]

## 2022-11-21 ENCOUNTER — Other Ambulatory Visit: Payer: Self-pay | Admitting: Gastroenterology

## 2022-11-21 ENCOUNTER — Other Ambulatory Visit (HOSPITAL_COMMUNITY): Payer: Self-pay

## 2022-11-21 IMAGING — MG MM DIGITAL SCREENING BILAT W/ TOMO AND CAD
6 of 10 series · 6 of 30 positions shown · non-contrast
Comparison: None available.

CLINICAL DATA: Screening.

EXAM:
DIGITAL SCREENING BILATERAL MAMMOGRAM WITH TOMOSYNTHESIS AND CAD
TECHNIQUE: Bilateral screening digital craniocaudal and mediolateral oblique
mammograms were obtained. Bilateral screening digital breast
tomosynthesis was performed. The images were evaluated with
computer-aided detection.

[R MLO synth-2D]
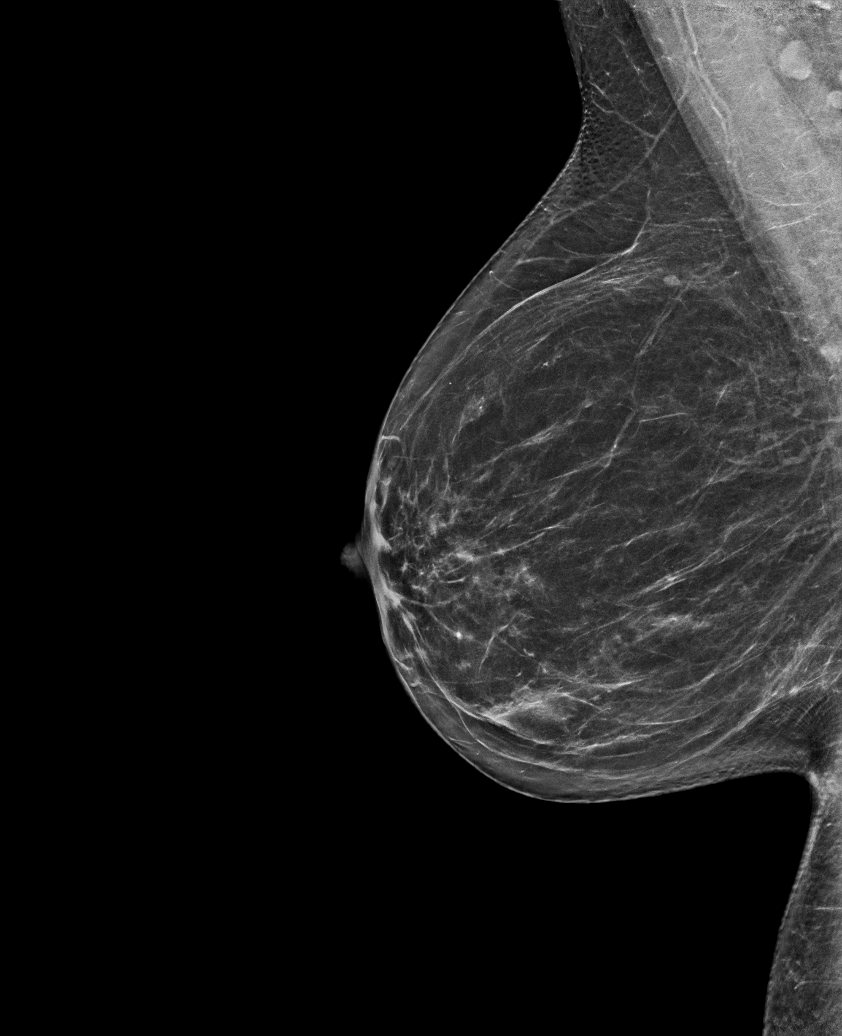

[L CC synth-2D]
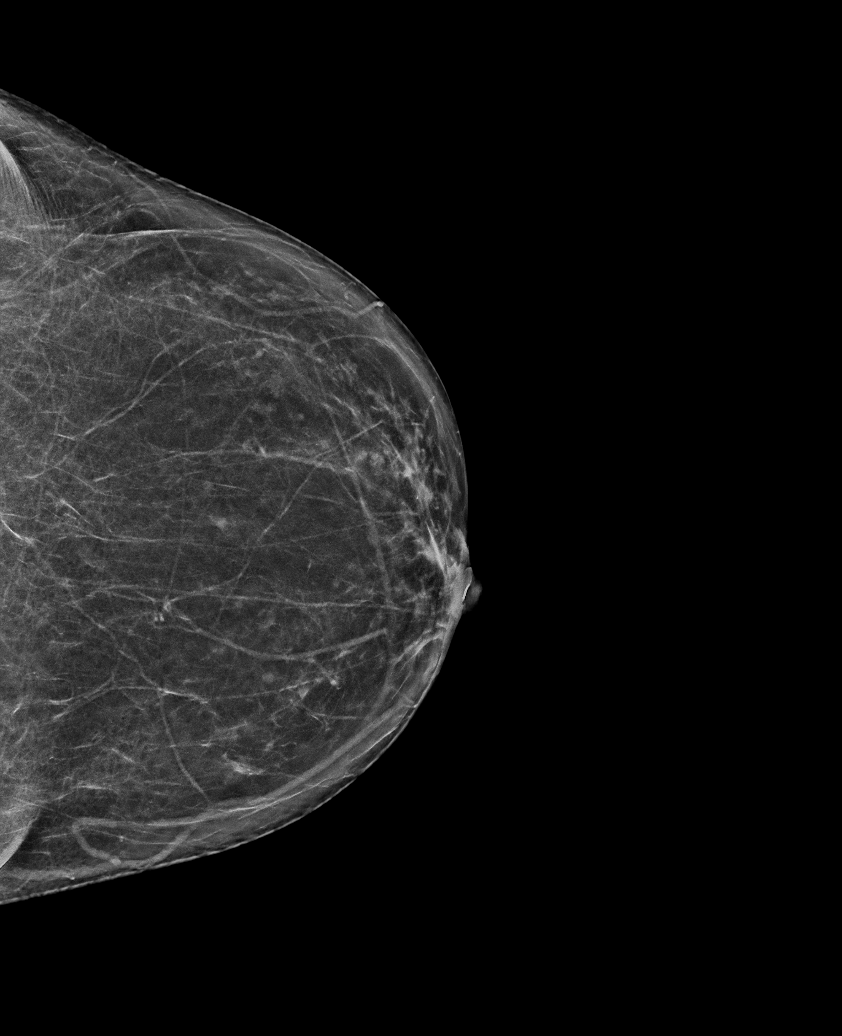

[L MLO synth-2D (1 of 2)]
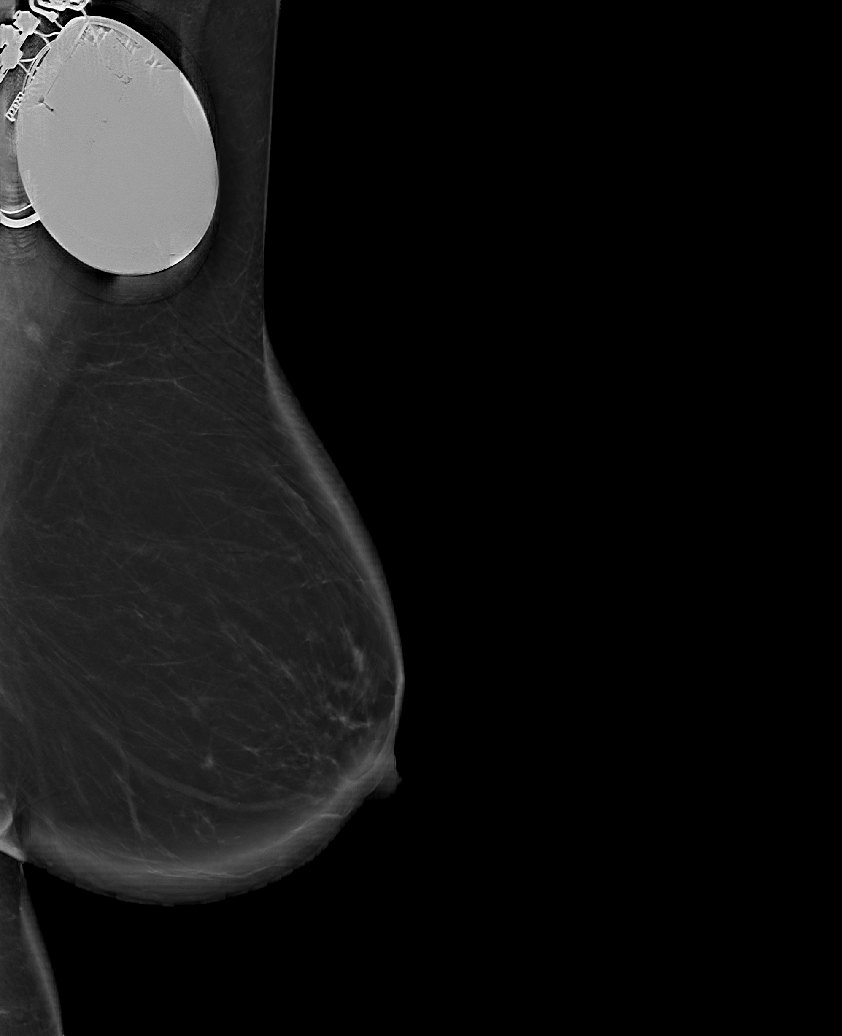

[R CC synth-2D]
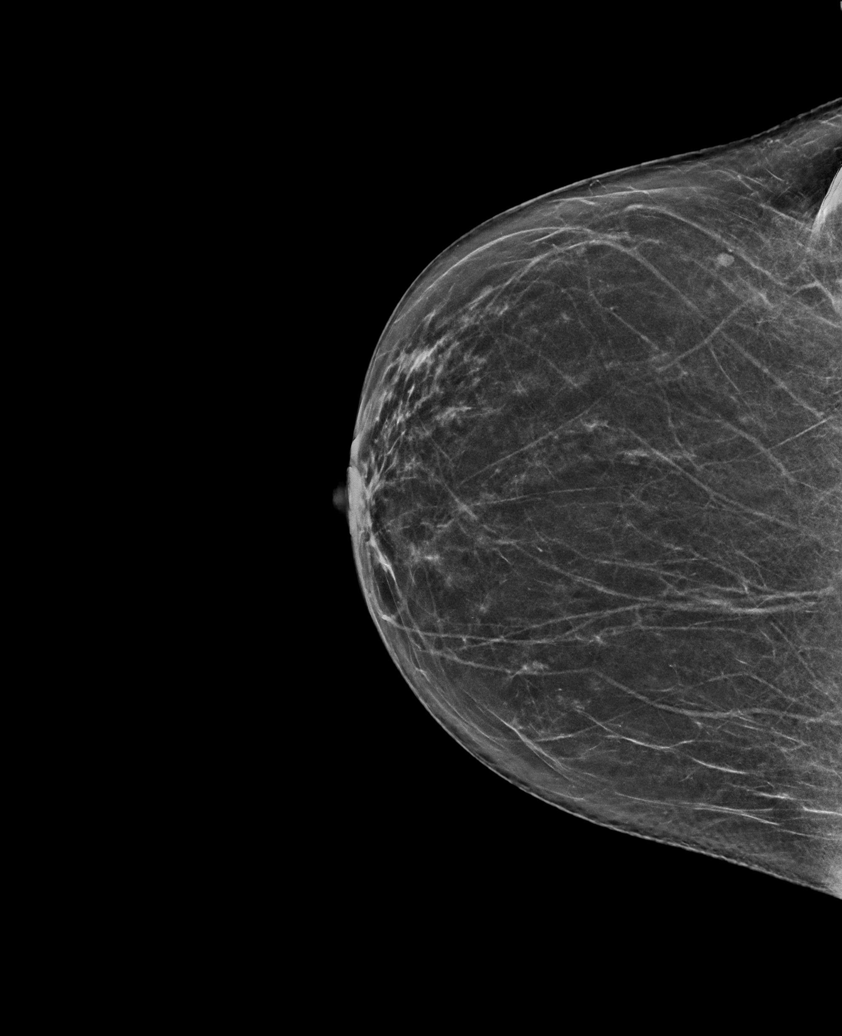

[L MLO synth-2D (2 of 2)]
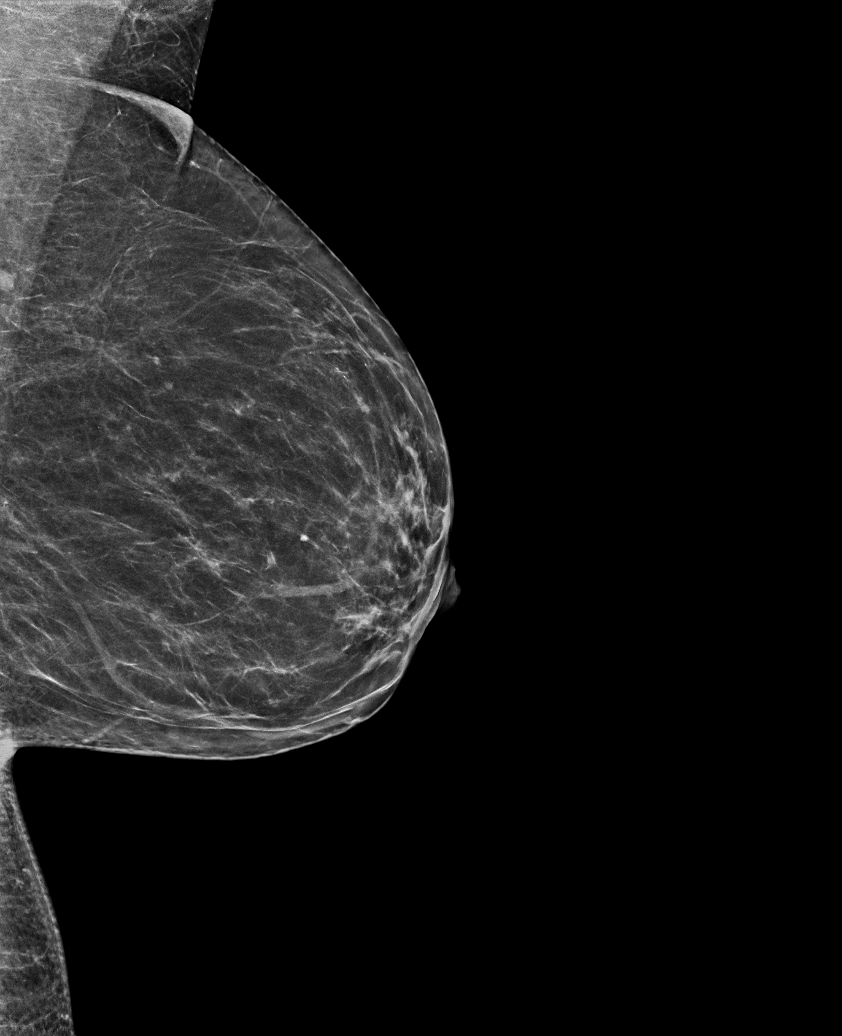

[L CC tomo · tomo slice 32/63.0]
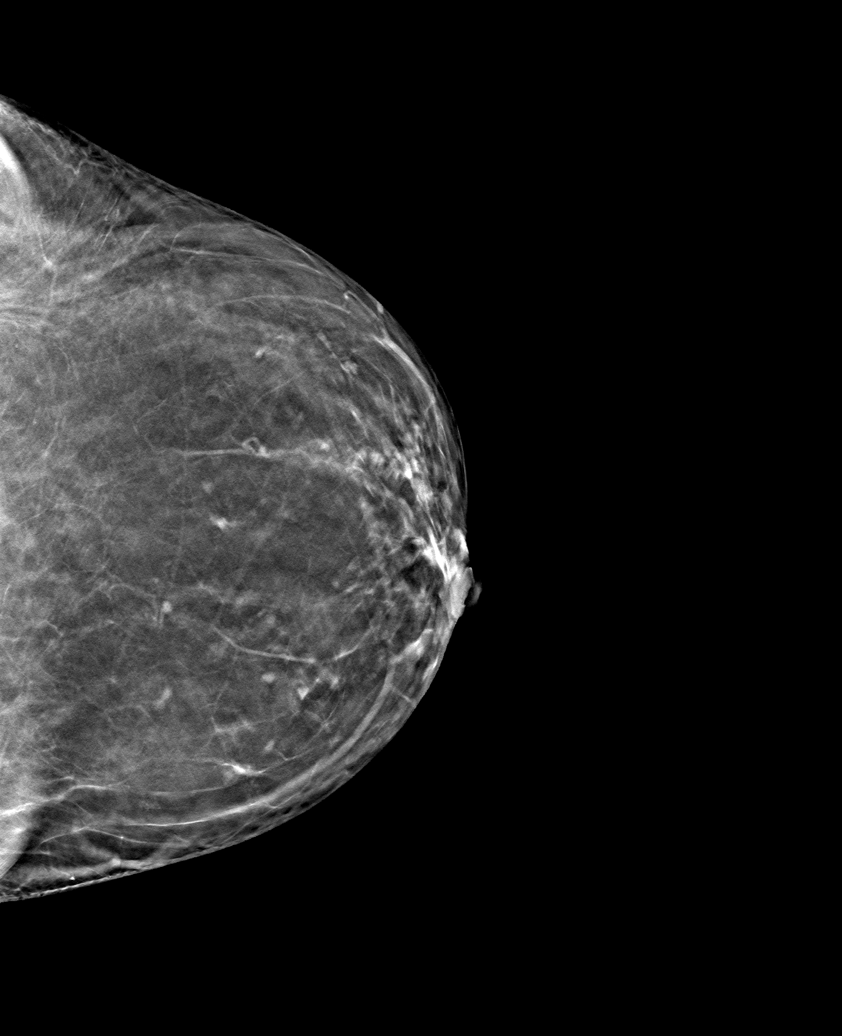

[6 of 30 positions shown; findings below may reference images not displayed]

ACR Breast Density Category b: There are scattered areas of
fibroglandular density.
FINDINGS: There are no findings suspicious for malignancy.
IMPRESSION: No mammographic evidence of malignancy. A result letter of this
screening mammogram will be mailed directly to the patient.

RECOMMENDATION:
Screening mammogram in one year. (Code:[id number]-[id number])

BI-RADS CATEGORY  1: Negative.

## 2022-11-22 ENCOUNTER — Other Ambulatory Visit (HOSPITAL_COMMUNITY): Payer: Self-pay

## 2022-11-22 MED ORDER — PROMETHAZINE HCL 25 MG PO TABS
25.0000 mg | ORAL_TABLET | Freq: Four times a day (QID) | ORAL | 2 refills | Status: DC
  Filled 2022-11-22: qty 90, 23d supply, fill #0
  Filled 2023-02-04: qty 90, 23d supply, fill #1
  Filled 2023-02-24: qty 90, 23d supply, fill #2

## 2022-11-23 ENCOUNTER — Encounter: Payer: Self-pay | Admitting: Internal Medicine

## 2022-11-26 DIAGNOSIS — L989 Disorder of the skin and subcutaneous tissue, unspecified: Secondary | ICD-10-CM | POA: Diagnosis not present

## 2022-11-28 ENCOUNTER — Encounter: Payer: Self-pay | Admitting: Internal Medicine

## 2022-11-28 ENCOUNTER — Other Ambulatory Visit (INDEPENDENT_AMBULATORY_CARE_PROVIDER_SITE_OTHER): Payer: Medicaid Other

## 2022-11-28 DIAGNOSIS — K50119 Crohn's disease of large intestine with unspecified complications: Secondary | ICD-10-CM | POA: Diagnosis not present

## 2022-11-28 LAB — C-REACTIVE PROTEIN: CRP: <1 mg/dL (ref 0.5–20.0)

## 2022-11-28 LAB — SEDIMENTATION RATE: Sed Rate: 43 mm/hr — ABNORMAL HIGH (ref 0–30)

## 2022-11-30 ENCOUNTER — Other Ambulatory Visit (HOSPITAL_COMMUNITY): Payer: Self-pay | Admitting: Internal Medicine

## 2022-11-30 ENCOUNTER — Other Ambulatory Visit: Payer: Self-pay | Admitting: Internal Medicine

## 2022-11-30 ENCOUNTER — Other Ambulatory Visit (HOSPITAL_COMMUNITY): Payer: Self-pay

## 2022-11-30 ENCOUNTER — Other Ambulatory Visit: Payer: Self-pay

## 2022-11-30 DIAGNOSIS — I5020 Unspecified systolic (congestive) heart failure: Secondary | ICD-10-CM

## 2022-11-30 DIAGNOSIS — K50119 Crohn's disease of large intestine with unspecified complications: Secondary | ICD-10-CM | POA: Diagnosis not present

## 2022-11-30 MED ORDER — FUROSEMIDE 40 MG PO TABS
80.0000 mg | ORAL_TABLET | Freq: Two times a day (BID) | ORAL | 3 refills | Status: DC
Start: 2022-11-30 — End: 2022-12-09
  Filled 2022-11-30: qty 200, 50d supply, fill #0

## 2022-11-30 MED ORDER — SPIRONOLACTONE 25 MG PO TABS
25.0000 mg | ORAL_TABLET | Freq: Every day | ORAL | 3 refills | Status: DC
  Filled 2022-11-30: qty 90, 90d supply, fill #0
  Filled 2023-04-02: qty 90, 90d supply, fill #1
  Filled 2023-06-30: qty 90, 90d supply, fill #2
  Filled 2023-08-12: qty 90, 90d supply, fill #0

## 2022-12-03 ENCOUNTER — Telehealth: Payer: Self-pay | Admitting: Internal Medicine

## 2022-12-03 NOTE — Telephone Encounter (Signed)
-----   Message from [place] [facility]as sent at 12/03/2022  2:38 PM EDT ----- Regarding: Nephrology Referral Message from  [facility]    "Pt. was rated a 3, not yet scheduled." Ivanna Pasillas said on Nov 30, 2022 10:56 AM ----- Message ----- From: Marcine Matar, MD Sent: 11/23/2022   6:34 PM EDT To: Dionne Bucy  Patient asking about the nephrology referral.  Can you please look into it.

## 2022-12-04 LAB — CALPROTECTIN, FECAL: Calprotectin, Fecal: 45 ug/g (ref 0–120)

## 2022-12-07 ENCOUNTER — Other Ambulatory Visit: Payer: Self-pay

## 2022-12-07 ENCOUNTER — Ambulatory Visit: Payer: Self-pay | Admitting: *Deleted

## 2022-12-07 ENCOUNTER — Encounter (HOSPITAL_BASED_OUTPATIENT_CLINIC_OR_DEPARTMENT_OTHER): Payer: Self-pay

## 2022-12-07 ENCOUNTER — Inpatient Hospital Stay (HOSPITAL_BASED_OUTPATIENT_CLINIC_OR_DEPARTMENT_OTHER)
Admission: EM | Admit: 2022-12-07 | Discharge: 2022-12-09 | DRG: 641 | Disposition: A | Discharge status: Home / Self Care | Payer: Medicaid Other | Attending: Internal Medicine | Admitting: Internal Medicine

## 2022-12-07 ENCOUNTER — Ambulatory Visit (HOSPITAL_COMMUNITY)
Admission: EM | Admit: 2022-12-07 | Discharge: 2022-12-07 | Disposition: A | Discharge status: Home / Self Care | Payer: Medicaid Other | Source: Home / Self Care

## 2022-12-07 ENCOUNTER — Encounter (HOSPITAL_COMMUNITY): Payer: Self-pay

## 2022-12-07 DIAGNOSIS — Z7984 Long term (current) use of oral hypoglycemic drugs: Secondary | ICD-10-CM

## 2022-12-07 DIAGNOSIS — Z95810 Presence of automatic (implantable) cardiac defibrillator: Secondary | ICD-10-CM | POA: Diagnosis not present

## 2022-12-07 DIAGNOSIS — E785 Hyperlipidemia, unspecified: Secondary | ICD-10-CM | POA: Diagnosis not present

## 2022-12-07 DIAGNOSIS — Z8249 Family history of ischemic heart disease and other diseases of the circulatory system: Secondary | ICD-10-CM | POA: Diagnosis not present

## 2022-12-07 DIAGNOSIS — E876 Hypokalemia: Principal | ICD-10-CM | POA: Diagnosis present

## 2022-12-07 DIAGNOSIS — R252 Cramp and spasm: Secondary | ICD-10-CM

## 2022-12-07 DIAGNOSIS — E869 Volume depletion, unspecified: Secondary | ICD-10-CM | POA: Diagnosis present

## 2022-12-07 DIAGNOSIS — I5042 Chronic combined systolic (congestive) and diastolic (congestive) heart failure: Secondary | ICD-10-CM | POA: Diagnosis not present

## 2022-12-07 DIAGNOSIS — I5020 Unspecified systolic (congestive) heart failure: Secondary | ICD-10-CM

## 2022-12-07 DIAGNOSIS — I959 Hypotension, unspecified: Secondary | ICD-10-CM | POA: Diagnosis present

## 2022-12-07 DIAGNOSIS — I2510 Atherosclerotic heart disease of native coronary artery without angina pectoris: Secondary | ICD-10-CM | POA: Diagnosis present

## 2022-12-07 DIAGNOSIS — R9431 Abnormal electrocardiogram [ECG] [EKG]: Secondary | ICD-10-CM | POA: Diagnosis not present

## 2022-12-07 DIAGNOSIS — I517 Cardiomegaly: Secondary | ICD-10-CM | POA: Diagnosis present

## 2022-12-07 DIAGNOSIS — Z881 Allergy status to other antibiotic agents status: Secondary | ICD-10-CM | POA: Diagnosis not present

## 2022-12-07 DIAGNOSIS — I252 Old myocardial infarction: Secondary | ICD-10-CM | POA: Diagnosis not present

## 2022-12-07 DIAGNOSIS — E119 Type 2 diabetes mellitus without complications: Secondary | ICD-10-CM | POA: Diagnosis present

## 2022-12-07 DIAGNOSIS — Z7901 Long term (current) use of anticoagulants: Secondary | ICD-10-CM

## 2022-12-07 DIAGNOSIS — Z9109 Other allergy status, other than to drugs and biological substances: Secondary | ICD-10-CM

## 2022-12-07 DIAGNOSIS — F32A Depression, unspecified: Secondary | ICD-10-CM | POA: Diagnosis present

## 2022-12-07 DIAGNOSIS — Z5941 Food insecurity: Secondary | ICD-10-CM

## 2022-12-07 DIAGNOSIS — Z87891 Personal history of nicotine dependence: Secondary | ICD-10-CM | POA: Diagnosis not present

## 2022-12-07 DIAGNOSIS — Z90710 Acquired absence of both cervix and uterus: Secondary | ICD-10-CM

## 2022-12-07 DIAGNOSIS — Z79899 Other long term (current) drug therapy: Secondary | ICD-10-CM | POA: Diagnosis not present

## 2022-12-07 DIAGNOSIS — Z885 Allergy status to narcotic agent status: Secondary | ICD-10-CM

## 2022-12-07 DIAGNOSIS — F419 Anxiety disorder, unspecified: Secondary | ICD-10-CM | POA: Diagnosis present

## 2022-12-07 DIAGNOSIS — Z882 Allergy status to sulfonamides status: Secondary | ICD-10-CM

## 2022-12-07 DIAGNOSIS — Z86718 Personal history of other venous thrombosis and embolism: Secondary | ICD-10-CM

## 2022-12-07 DIAGNOSIS — G4730 Sleep apnea, unspecified: Secondary | ICD-10-CM | POA: Diagnosis present

## 2022-12-07 DIAGNOSIS — F909 Attention-deficit hyperactivity disorder, unspecified type: Secondary | ICD-10-CM | POA: Diagnosis present

## 2022-12-07 DIAGNOSIS — K5090 Crohn's disease, unspecified, without complications: Secondary | ICD-10-CM | POA: Diagnosis present

## 2022-12-07 DIAGNOSIS — Z5986 Financial insecurity: Secondary | ICD-10-CM

## 2022-12-07 DIAGNOSIS — N179 Acute kidney failure, unspecified: Secondary | ICD-10-CM | POA: Diagnosis not present

## 2022-12-07 DIAGNOSIS — Z7689 Persons encountering health services in other specified circumstances: Secondary | ICD-10-CM | POA: Diagnosis not present

## 2022-12-07 DIAGNOSIS — Z886 Allergy status to analgesic agent status: Secondary | ICD-10-CM

## 2022-12-07 DIAGNOSIS — R739 Hyperglycemia, unspecified: Secondary | ICD-10-CM

## 2022-12-07 LAB — COMPREHENSIVE METABOLIC PANEL
ALT: 31 U/L (ref 0–44)
AST: 23 U/L (ref 15–41)
Albumin: 4.1 g/dL (ref 3.5–5.0)
Alkaline Phosphatase: 115 U/L (ref 38–126)
Anion gap: 13 (ref 5–15)
BUN: 25 mg/dL — ABNORMAL HIGH (ref 6–20)
CO2: 32 mmol/L (ref 22–32)
Calcium: 9.4 mg/dL (ref 8.9–10.3)
Chloride: 84 mmol/L — ABNORMAL LOW (ref 98–111)
Creatinine, Ser: 1.13 mg/dL — ABNORMAL HIGH (ref 0.44–1.00)
GFR, Estimated: 57 mL/min — ABNORMAL LOW (ref >60)
Glucose, Bld: 148 mg/dL — ABNORMAL HIGH (ref 70–99)
Potassium: 2.7 mmol/L — CL (ref 3.5–5.1)
Sodium: 129 mmol/L — ABNORMAL LOW (ref 135–145)
Total Bilirubin: 0.9 mg/dL (ref 0.3–1.2)
Total Protein: 7.6 g/dL (ref 6.5–8.1)

## 2022-12-07 LAB — BASIC METABOLIC PANEL
Anion gap: 12 (ref 5–15)
BUN: 32 mg/dL — ABNORMAL HIGH (ref 6–20)
CO2: 38 mmol/L — ABNORMAL HIGH (ref 22–32)
Calcium: 10.4 mg/dL — ABNORMAL HIGH (ref 8.9–10.3)
Chloride: 83 mmol/L — ABNORMAL LOW (ref 98–111)
Creatinine, Ser: 1.31 mg/dL — ABNORMAL HIGH (ref 0.44–1.00)
GFR, Estimated: 48 mL/min — ABNORMAL LOW (ref >60)
Glucose, Bld: 128 mg/dL — ABNORMAL HIGH (ref 70–99)
Potassium: 2.3 mmol/L — CL (ref 3.5–5.1)
Sodium: 133 mmol/L — ABNORMAL LOW (ref 135–145)

## 2022-12-07 LAB — MAGNESIUM: Magnesium: 2.1 mg/dL (ref 1.7–2.4)

## 2022-12-07 MED ORDER — POTASSIUM CHLORIDE 10 MEQ/100ML IV SOLN
10.0000 meq | INTRAVENOUS | Status: AC
  Administered 2022-12-07 – 2022-12-08 (×4): 10 meq via INTRAVENOUS
  Filled 2022-12-07 (×4): qty 100

## 2022-12-07 MED ORDER — SODIUM CHLORIDE 0.9 % IV SOLN: Freq: Once | INTRAVENOUS | Status: AC

## 2022-12-07 MED ORDER — POTASSIUM CHLORIDE 20 MEQ PO PACK
160.0000 meq | PACK | Freq: Once | ORAL | Status: AC
  Administered 2022-12-07: 160 meq via ORAL
  Filled 2022-12-07: qty 8

## 2022-12-07 MED ORDER — POTASSIUM CHLORIDE 10 MEQ/100ML IV SOLN: 10.0000 meq | Freq: Once | INTRAVENOUS | Status: DC

## 2022-12-07 NOTE — Progress Notes (Signed)
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  She was given aggressive potassium repletion in the ED.  Plan of care: The patient is accepted for admission to Telemetry unit, at [facility].  Author: Briscoe Deutscher, MD 12/07/2022  Check [email or web address] for on-call coverage.  Nursing staff, Please call [facility] Admits & Consults System-Wide number on Amion as soon as patient's arrival, so appropriate admitting provider can evaluate the pt.

## 2022-12-07 NOTE — Telephone Encounter (Signed)
Patient was seen at [facility] today. K+ was 2.7.  Will route to PCP as FYI.

## 2022-12-07 NOTE — Discharge Instructions (Addendum)
I will call you if your metabolic panel has any abnormality If potassium is critically low you will need to go to the emergency department.  Please call primary care first thing Monday morning to make an earlier appointment for further evaluation

## 2022-12-07 NOTE — ED Provider Notes (Signed)
[place] EMERGENCY DEPARTMENT AT [facility] Provider Note   CSN: [id number] Arrival date & time: 12/07/22  1813     History  Chief Complaint  Patient presents with   Abnormal Lab   hypokalemia    hypokalemia    Christine [name] is a 57 y.o. female w/ hx of CAD, Crohns, hypokalemia that p/w hypokalemia.  Started to have leg and feet cramps and back discomfort last night, suspected that her K was low, took AM dose of K, called PCP, directed to UC, at UC they checked her labs, found to have K of 2.7, and was directed to ED.   Pt has chronic hx of hypokalemia. She takes PO K BID. When her K is low, she reports that she gets symptoms including cramps in feet and hips, feels hot, and get headache when she has low K in the past. She has been referred to see nephrologist for further workup, but is still waiting to see them.   Take PO K BID .   Has been referred by PCP to see nephrologist.   Has also felt some SOB, but no chest pain. No new diarrhea.   Abnormal Lab      Home Medications Prior to Admission medications   Medication Sig Start Date End Date Taking? Authorizing Provider  Accu-Chek Softclix Lancets lancets Use as instructed.  Check blood sugar daily before breakfast. 11/02/22   Marcine Matar, MD  acetaminophen (TYLENOL) 500 MG tablet Take 1,000 mg by mouth 3 (three) times daily as needed for moderate pain or headache.    [provider]  ALPRAZolam Prudy Feeler) 1 MG tablet Take 2 mg by mouth 3 (three) times daily.    [provider]  alum & mag hydroxide-simeth (MAALOX/MYLANTA) 200-200-20 MG/5ML suspension Take 30 mLs by mouth every 4 (four) hours as needed for indigestion or heartburn. 03/02/22   Atway, Rayann N, DO  apixaban (ELIQUIS) 5 MG TABS tablet Take 1 tablet (5 mg total) by mouth 2 (two) times daily. 11/05/22   Bensimhon, Bevelyn Buckles, MD  atorvastatin (LIPITOR) 80 MG tablet Take 1 tablet (80 mg total) by mouth daily. 01/19/22    Laurey Morale, MD  Blood Glucose Monitoring Suppl (ACCU-CHEK GUIDE) w/Device KIT Check blood sugar once daily before breakfast 11/02/22   Marcine Matar, MD  buPROPion (WELLBUTRIN XL) 150 MG 24 hr tablet Take 300 mg by mouth daily.    [provider]  cyclobenzaprine (FLEXERIL) 10 MG tablet Take 20 mg by mouth at bedtime.    [provider]  dapagliflozin propanediol (FARXIGA) 10 MG TABS tablet Take 1 tablet (10 mg total) by mouth daily. 10/23/22   Bensimhon, Bevelyn Buckles, MD  diphenhydrAMINE (BENADRYL) 25 MG tablet Take 50 mg by mouth as needed for allergies.    [provider]  diphenoxylate-atropine (LOMOTIL) 2.5-0.025 MG tablet Take 2 tablets by mouth 4 (four) times daily as needed for diarrhea/loose stools. 11/19/22   Cirigliano, Vito V, DO  diphenoxylate-atropine (LOMOTIL) 2.5-0.025 MG tablet Take 2 tablets by mouth 4 (four) times daily as needed for diarrhea/loose stools. 11/19/22     feeding supplement (ENSURE ENLIVE / ENSURE PLUS) LIQD Take 237 mLs by mouth 2 (two) times daily between meals. 03/03/22   Atway, Rayann N, DO  Fezolinetant (VEOZAH) 45 MG TABS Take 1 tablet (45 mg total) by mouth daily. 11/08/22   Marcine Matar, MD  furosemide (LASIX) 40 MG tablet Take 2 tablets (80 mg total) by mouth in  the morning and every evening 11/30/22   Bensimhon, Bevelyn Buckles, MD  glucose blood (ACCU-CHEK GUIDE) test strip Use as instructed to check blood sugar daily before breakfast 11/02/22   Marcine Matar, MD  ivabradine (CORLANOR) 5 MG TABS tablet Take 1 tablet (5 mg total) by mouth 2 (two) times daily. 11/08/22   Marcine Matar, MD  lipase/protease/amylase (CREON) 36000 UNITS CPEP capsule Take 2 capsules ( 72000 units) with meals and 1 capsule ( 36000 units) with snack. Patient taking differently: 36,000 Units with breakfast, with lunch, and with evening meal. Take 1 tablet before breakfast 03/13/22   Cirigliano, Vito V, DO  metolazone (ZAROXOLYN) 2.5 MG tablet Take 1  tablet (2.5 mg total) by mouth as directed by [facility] 11/20/22   Jacklynn Ganong, FNP  metoprolol succinate (TOPROL-XL) 25 MG 24 hr tablet Take 1 tablet (25 mg total) by mouth daily. Take with or immediately following a meal. 07/05/22   Bensimhon, Bevelyn Buckles, MD  metroNIDAZOLE (METROGEL) 1 % gel Apply topically as needed.    [provider]  Multiple Vitamins-Minerals (MULTIVITAMIN WITH MINERALS) tablet Take 1 tablet by mouth daily.    [provider]  oxymetazoline (AFRIN) 0.05 % nasal spray Place 1-2 sprays into both nostrils 2 (two) times daily as needed for congestion.    [provider]  permethrin (ELIMITE) 5 % cream Apply from neck down before bed then wash off in the morning. May repeat in one week. 10/31/22   Mardella Layman, MD  Potassium Chloride 40 MEQ/15ML (20%) SOLN Take 60 ml (160 mEq) by mouth 2 (two) times daily. 11/20/22   Marcine Matar, MD  predniSONE (STERAPRED UNI-PAK 21 TAB) 10 MG (21) TBPK tablet Take by mouth daily. Take 6 tabs by mouth daily  for 2 days, then 5 tabs for 2 days, then 4 tabs for 2 days, then 3 tabs for 2 days, 2 tabs for 2 days, then 1 tab by mouth daily for 2 days 11/09/22   Jeanelle Malling, PA  promethazine (PHENERGAN) 25 MG tablet Take 1 tablet (25 mg total) by mouth every 6 (six) to 8 (eight) hours for nausea or vomiting. 11/22/22   Cirigliano, Vito V, DO  sitaGLIPtin (JANUVIA) 50 MG tablet Take 1 tablet (50 mg total) by mouth daily. 11/02/22   Marcine Matar, MD  spironolactone (ALDACTONE) 25 MG tablet Take 1 tablet (25 mg total) by mouth daily. 11/30/22   Bensimhon, Bevelyn Buckles, MD  zolpidem (AMBIEN) 10 MG tablet Take 5-10 mg by mouth at bedtime.    [provider]      Allergies    Sulfa antibiotics, Erythromycin, Tramadol, Ibuprofen, and Tape    Review of Systems   Review of Systems  Physical Exam Updated Vital Signs BP 105/66   Pulse 79   Temp 98.1 F (36.7 C) (Oral)   Resp 17   Ht 5\' 10"  (1.778 m)    Wt 80.3 kg   SpO2 99%   BMI 25.40 kg/m  Physical Exam Gen: Alert, pleasant woman sitting comfortably in bed. Speaking in full sentences and responding appropriately.  HEENT: NCAT. MMM. Resp: CTAB, no crackles or wheezing. Normal WOB. CV: RRR, no murmurs.  Abm: Soft, nontender, nondistended. Normal BS Msk: Paraspinal discomfort to palpation in lumbar region.   ED Results / Procedures / Treatments   Labs (all labs ordered are listed, but only abnormal results are displayed) Labs Reviewed  BASIC METABOLIC PANEL - Abnormal; Notable for the  following components:      Result Value   Sodium 133 (*)    Potassium 2.3 (*)    Chloride 83 (*)    CO2 38 (*)    Glucose, Bld 128 (*)    BUN 32 (*)    Creatinine, Ser 1.31 (*)    Calcium 10.4 (*)    GFR, Estimated 48 (*)    All other components within normal limits  MAGNESIUM  BASIC METABOLIC PANEL    EKG EKG Interpretation Date/Time:  Friday December 07 2022 18:20:15 EDT Ventricular Rate:  85 PR Interval:  160 QRS Duration:  98 QT Interval:  440 QTC Calculation: 523 R Axis:   26  Text Interpretation: Normal sinus rhythm Inferior infarct (cited on or before 09-Nov-2022) Anterolateral infarct (cited on or before 09-Nov-2022) new Prolonged QT When compared with ECG of 09-Nov-2022 17:39, Non-specific change in ST segment in Anterior leads QT has lengthened Confirmed by Gwyneth Sprout ([id number]) on 12/07/2022 6:40:12 PM  Radiology No results found.  Procedures Procedures   Medications Ordered in ED Medications  potassium chloride 10 mEq in 100 mL IVPB ( Intravenous Infusion Verify 12/07/22 2204)  potassium chloride (KLOR-CON) packet 160 mEq (160 mEq Oral Given 12/07/22 2203)  0.9 %  sodium chloride infusion ( Intravenous New Bag/Given 12/07/22 2129)    ED Course/ Medical Decision Making/ A&P                                Medical Decision Making:   Eola Boudoin is a 57 y.o. female who presented to the ED today with hypokalemia  detailed above.     Complete initial physical exam performed. Reviewed and confirmed nursing documentation for past medical history, family history, social history.    Initial Assessment:   With the patient's presentation of hypokalemia, differential includes GI loss or malabsorption from Crohns, renal loss, hypomagnesemia, or inadequate intake/malnutrition.     This is most consistent with an acute complicated illness  Initial Plan:  BMP and Mg to workup electrolytes  EKG to evaluate for cardiac pathology Objective evaluation as below reviewed   Initial Study Results:   Laboratory  - K 2.3 - Mg wnl  EKG EKG was reviewed independently. Rate, rhythm, axis, intervals all examined and without medically relevant abnormality. ST segments without concerns for elevations.    Radiology:  All images reviewed independently. Agree with radiology report at this time.   CT ABDOMEN PELVIS W CONTRAST  Result Date: 11/09/2022 CLINICAL DATA:  Right lower quadrant abdominal pain. History of Crohn's disease. Diarrhea. EXAM: CT ABDOMEN AND PELVIS WITH CONTRAST TECHNIQUE: Multidetector CT imaging of the abdomen and pelvis was performed using the standard protocol following bolus administration of intravenous contrast. RADIATION DOSE REDUCTION: This exam was performed according to the departmental dose-optimization program which includes automated exposure control, adjustment of the mA and/or kV according to patient size and/or use of iterative reconstruction technique. CONTRAST:  OMNIPAQUE IOHEXOL 300 MG/ML  SOLN COMPARISON:  CT enterography 07/18/2022 FINDINGS: Lower chest: No acute abnormality. Hepatobiliary: Unremarkable liver and biliary tree. Cholecystectomy. Pancreas: Unremarkable. Spleen: Unremarkable. Adrenals/Urinary Tract: Stable adrenal glands. No urinary calculi or hydronephrosis. Unremarkable bladder. Stomach/Bowel: Normal caliber large and small bowel. Fecalization of the distal ileum  compatible with slow transit. There is very mild wall thickening and mucosal hyperenhancement of the distal ileum (circa series 5/image 46). Normal appendix. Stomach is within normal limits. Vascular/Lymphatic: Aortic atherosclerosis. Infrarenal abdominal  aortic aneurysm measuring 32 mm. No enlarged abdominal or pelvic lymph nodes. Reproductive: Hysterectomy. Other: No free intraperitoneal fluid or air. Musculoskeletal: No acute fracture. IMPRESSION: 1. Very mild wall thickening and mucosal hyperenhancement of the distal ileum compatible with mild active Crohn's disease. 2. Fecalization of the distal ileum compatible with slow transit. 3. Infrarenal abdominal aortic aneurysm measuring 32 mm. Recommend follow-up ultrasound every 3 years. This recommendation follows ACR consensus guidelines: White Paper of the ACR Incidental Findings Committee II on Vascular Findings. J Am Coll Radiol 2013; 10:789-794. Aortic Atherosclerosis (ICD10-I70.0). Electronically Signed   By: Minerva Fester M.D.   On: 11/09/2022 20:05     Reassessment and Plan:   - K 2.3 and normal Mg. Plan to give IV K x4 and give home dose of PO K , then recheck BMP. - Suspect that pt Crohns is interfering w/ ability to absorb PO K. - Reassessed at 2215, pt is having burning due to K infusion. Added NS IVF and cut rate of IV K from 100 to 3ml/hr. Given critically low K and need for prolonged runs of IV K, recommended admission for further management.     Final Clinical Impression(s) / ED Diagnoses Final diagnoses:  Hypokalemia    Rx / DC Orders ED Discharge Orders     None         Lincoln Brigham, MD 12/07/22 2249    Gwyneth Sprout, MD 12/13/22 1630

## 2022-12-07 NOTE — ED Provider Notes (Signed)
[facility]    CSN: [id number] Arrival date & time: 12/07/22  1236     History   Chief Complaint Chief Complaint  Patient presents with   Foot Pain    cramping    HPI Christine [name] is a 57 y.o. female.  Last night was having some lower extremity cramping, in her feet, legs, hips. She has history of low potassium and believes it is low again. She called her PCP who referred her here for a potassium check. She takes oral supplement twice daily, total of 320 mEq Is awaiting referral to nephrology History of CHF. Also newly diagnosed diabetes.   Denies shortness of breath, chest pain, palpitations at this time   Past Medical History:  Diagnosis Date   Allergy    Anxiety 01/20/2021   CAD in native artery 08/24/2021   CHF (congestive heart failure) (HCC)    Chronic combined systolic and diastolic heart failure (HCC) 08/24/2021   Complication of anesthesia    Depression    Hyperlipidemia    Myocardial infarction [facility])    Presence of cardiac defibrillator 07/2021   Shortness of breath 01/20/2021   Sleep apnea 07/2021   Tobacco abuse 01/20/2021    Patient Active Problem List   Diagnosis Date Noted   Crohn's disease involving terminal ileum (HCC) 07/23/2022   Crohn's disease of large intestine with other complication (HCC) 05/11/2022   Mild major depression (HCC) 05/11/2022   Paroxysmal tachycardia, unspecified (HCC) 05/11/2022   Essential hypertension 03/02/2022   Acute pancreatitis 03/01/2022   Hypokalemia 03/01/2022   Ileitis 03/01/2022   Diarrhea 02/26/2022   Nausea without vomiting 02/26/2022   Loss of weight 02/26/2022   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   Ulcer of ileum 02/26/2022   Diverticulosis of colon without hemorrhage 02/26/2022   Cardiomyopathy (HCC) ischemic and non-ischemic 01/16/2022   Abdominal aortic aneurysm (AAA) 3.0 cm to 5.0 cm in diameter in female (HCC) 01/09/2022   Polycythemia 01/09/2022   Chronic combined systolic and  diastolic heart failure (HCC) 08/24/2021   CAD in native artery 08/24/2021   S/P ICD (internal cardiac defibrillator) procedure 08/10/2021   Obstructive sleep apnea 05/05/2021   Former smoker 02/06/2021   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   Obesity (BMI 30.0-34.9) 02/06/2021   Anxiety 01/20/2021   Tobacco abuse 01/20/2021   Shortness of breath 01/20/2021   Chest tightness     Past Surgical History:  Procedure Laterality Date   ABDOMINAL HYSTERECTOMY     BIOPSY  02/26/2022   Procedure: BIOPSY;  Surgeon: Shellia Cleverly, DO;  Location: [facility];  Service: Gastroenterology;;   CARDIAC CATHETERIZATION     CHOLECYSTECTOMY     COLONOSCOPY WITH PROPOFOL N/A 02/26/2022   Procedure: COLONOSCOPY WITH PROPOFOL;  Surgeon: Shellia Cleverly, DO;  Location: [facility];  Service: Gastroenterology;  Laterality: N/A;   ESOPHAGOGASTRODUODENOSCOPY (EGD) WITH PROPOFOL N/A 02/26/2022   Procedure: ESOPHAGOGASTRODUODENOSCOPY (EGD) WITH PROPOFOL;  Surgeon: Shellia Cleverly, DO;  Location: [facility];  Service: Gastroenterology;  Laterality: N/A;   ICD IMPLANT N/A 07/21/2021   Procedure: ICD IMPLANT;  Surgeon: Duke Salvia, MD;  Location: [facility] INVASIVE CV LAB;  Service: Cardiovascular;  Laterality: N/A;   NECK SURGERY     OVARIAN CYST SURGERY     POLYPECTOMY  02/26/2022   Procedure: POLYPECTOMY;  Surgeon: Shellia Cleverly, DO;  Location: [facility];  Service: Gastroenterology;;   RIGHT/LEFT HEART CATH AND CORONARY ANGIOGRAPHY N/A 01/23/2021   Procedure: RIGHT/LEFT HEART CATH AND CORONARY ANGIOGRAPHY;  Surgeon: Dolores Patty, MD;  Location: [facility] INVASIVE CV LAB;  Service: Cardiovascular;  Laterality: N/A;    OB History     Gravida  1   Para      Term      Preterm      AB  1   Living         SAB      IAB      Ectopic      Multiple      Live Births               Home Medications    Prior to Admission medications   Medication Sig Start Date End Date  Taking? Authorizing Provider  Accu-Chek Softclix Lancets lancets Use as instructed.  Check blood sugar daily before breakfast. 11/02/22   Marcine Matar, MD  acetaminophen (TYLENOL) 500 MG tablet Take 1,000 mg by mouth 3 (three) times daily as needed for moderate pain or headache.    [provider]  ALPRAZolam Prudy Feeler) 1 MG tablet Take 2 mg by mouth 3 (three) times daily.    [provider]  alum & mag hydroxide-simeth (MAALOX/MYLANTA) 200-200-20 MG/5ML suspension Take 30 mLs by mouth every 4 (four) hours as needed for indigestion or heartburn. 03/02/22   Atway, Rayann N, DO  apixaban (ELIQUIS) 5 MG TABS tablet Take 1 tablet (5 mg total) by mouth 2 (two) times daily. 11/05/22   Bensimhon, Bevelyn Buckles, MD  atorvastatin (LIPITOR) 80 MG tablet Take 1 tablet (80 mg total) by mouth daily. 01/19/22   Laurey Morale, MD  Blood Glucose Monitoring Suppl (ACCU-CHEK GUIDE) w/Device KIT Check blood sugar once daily before breakfast 11/02/22   Marcine Matar, MD  buPROPion (WELLBUTRIN XL) 150 MG 24 hr tablet Take 300 mg by mouth daily.    [provider]  cyclobenzaprine (FLEXERIL) 10 MG tablet Take 20 mg by mouth at bedtime.    [provider]  dapagliflozin propanediol (FARXIGA) 10 MG TABS tablet Take 1 tablet (10 mg total) by mouth daily. 10/23/22   Bensimhon, Bevelyn Buckles, MD  diphenhydrAMINE (BENADRYL) 25 MG tablet Take 50 mg by mouth as needed for allergies.    [provider]  diphenoxylate-atropine (LOMOTIL) 2.5-0.025 MG tablet Take 2 tablets by mouth 4 (four) times daily as needed for diarrhea/loose stools. 11/19/22   Cirigliano, Vito V, DO  diphenoxylate-atropine (LOMOTIL) 2.5-0.025 MG tablet Take 2 tablets by mouth 4 (four) times daily as needed for diarrhea/loose stools. 11/19/22     feeding supplement (ENSURE ENLIVE / ENSURE PLUS) LIQD Take 237 mLs by mouth 2 (two) times daily between meals. 03/03/22   Atway, Rayann N, DO  Fezolinetant (VEOZAH) 45 MG TABS Take 1  tablet (45 mg total) by mouth daily. 11/08/22   Marcine Matar, MD  furosemide (LASIX) 40 MG tablet Take 2 tablets (80 mg total) by mouth in the morning and every evening 11/30/22   Bensimhon, Bevelyn Buckles, MD  glucose blood (ACCU-CHEK GUIDE) test strip Use as instructed to check blood sugar daily before breakfast 11/02/22   Marcine Matar, MD  ivabradine (CORLANOR) 5 MG TABS tablet Take 1 tablet (5 mg total) by mouth 2 (two) times daily. 11/08/22   Marcine Matar, MD  lipase/protease/amylase (CREON) 36000 UNITS CPEP capsule Take 2 capsules ( 72000 units) with meals and 1 capsule ( 36000 units) with snack. Patient taking differently: 36,000 Units with breakfast, with lunch, and with evening meal. Take 1 tablet before  breakfast 03/13/22   Cirigliano, Vito V, DO  metolazone (ZAROXOLYN) 2.5 MG tablet Take 1 tablet (2.5 mg total) by mouth as directed by [facility] 11/20/22   Jacklynn Ganong, FNP  metoprolol succinate (TOPROL-XL) 25 MG 24 hr tablet Take 1 tablet (25 mg total) by mouth daily. Take with or immediately following a meal. 07/05/22   Bensimhon, Bevelyn Buckles, MD  metroNIDAZOLE (METROGEL) 1 % gel Apply topically as needed.    [provider]  Multiple Vitamins-Minerals (MULTIVITAMIN WITH MINERALS) tablet Take 1 tablet by mouth daily.    [provider]  oxymetazoline (AFRIN) 0.05 % nasal spray Place 1-2 sprays into both nostrils 2 (two) times daily as needed for congestion.    [provider]  permethrin (ELIMITE) 5 % cream Apply from neck down before bed then wash off in the morning. May repeat in one week. 10/31/22   Mardella Layman, MD  Potassium Chloride 40 MEQ/15ML (20%) SOLN Take 60 ml (160 mEq) by mouth 2 (two) times daily. 11/20/22   Marcine Matar, MD  predniSONE (STERAPRED UNI-PAK 21 TAB) 10 MG (21) TBPK tablet Take by mouth daily. Take 6 tabs by mouth daily  for 2 days, then 5 tabs for 2 days, then 4 tabs for 2 days, then 3 tabs for 2 days, 2  tabs for 2 days, then 1 tab by mouth daily for 2 days 11/09/22   Jeanelle Malling, PA  promethazine (PHENERGAN) 25 MG tablet Take 1 tablet (25 mg total) by mouth every 6 (six) to 8 (eight) hours for nausea or vomiting. 11/22/22   Cirigliano, Vito V, DO  sitaGLIPtin (JANUVIA) 50 MG tablet Take 1 tablet (50 mg total) by mouth daily. 11/02/22   Marcine Matar, MD  spironolactone (ALDACTONE) 25 MG tablet Take 1 tablet (25 mg total) by mouth daily. 11/30/22   Bensimhon, Bevelyn Buckles, MD  zolpidem (AMBIEN) 10 MG tablet Take 5-10 mg by mouth at bedtime.    [provider]    Family History Family History  Problem Relation Age of Onset   Stroke Mother    Atrial fibrillation Mother    Heart failure Father    Stomach cancer Maternal Grandfather    Liver cancer Neg Hx    Esophageal cancer Neg Hx    Colon polyps Neg Hx    Colon cancer Neg Hx     Social History Social History   Tobacco Use   Smoking status: Former    Current packs/day: 0.00    Types: Cigarettes    Quit date: 01/15/2021    Years since quitting: 1.8   Smokeless tobacco: Never  Vaping Use   Vaping status: Never Used  Substance Use Topics   Alcohol use: Yes    Comment: rarely   Drug use: Never     Allergies   Sulfa antibiotics, Erythromycin, Tramadol, Ibuprofen, and Tape   Review of Systems Review of Systems As per HPI  Physical Exam Triage Vital Signs ED Triage Vitals  Encounter Vitals Group     BP 12/07/22 1505 91/61     Systolic BP Percentile --      Diastolic BP Percentile --      Pulse Rate 12/07/22 1505 74     Resp 12/07/22 1505 16     Temp 12/07/22 1505 98.2 F (36.8 C)     Temp Source 12/07/22 1505 Oral     SpO2 12/07/22 1505 95 %     Weight 12/07/22 1503 177 lb (  80.3 kg)     Height 12/07/22 1503 5\' 10"  (1.778 m)     Head Circumference --      Peak Flow --      Pain Score 12/07/22 1503 4     Pain Loc --      Pain Education --      Exclude from Growth Chart --    No data found.  Updated Vital  Signs BP 91/61 (BP Location: Left Arm)   Pulse 74   Temp 98.2 F (36.8 C) (Oral)   Resp 16   Ht 5\' 10"  (1.778 m)   Wt 177 lb (80.3 kg)   SpO2 95%   BMI 25.40 kg/m    Physical Exam Vitals and nursing note reviewed.  Constitutional:      General: She is not in acute distress.    Appearance: She is not ill-appearing.  HENT:     Mouth/Throat:     Pharynx: Oropharynx is clear.  Eyes:     Conjunctiva/sclera: Conjunctivae normal.  Cardiovascular:     Rate and Rhythm: Normal rate and regular rhythm.     Pulses: Normal pulses.     Heart sounds: Normal heart sounds.     Comments: RRR Pulmonary:     Effort: Pulmonary effort is normal.     Breath sounds: Normal breath sounds.     Comments: Normal work of breathing.  Abdominal:     Tenderness: There is no abdominal tenderness.  Skin:    General: Skin is warm and dry.  Neurological:     General: No focal deficit present.     Mental Status: She is alert and oriented to person, place, and time.     Motor: No weakness or tremor.     Gait: Gait is intact.     UC Treatments / Results  Labs (all labs ordered are listed, but only abnormal results are displayed) Labs Reviewed  COMPREHENSIVE METABOLIC PANEL - Abnormal; Notable for the following components:      Result Value   Sodium 129 (*)    Potassium 2.7 (*)    Chloride 84 (*)    Glucose, Bld 148 (*)    BUN 25 (*)    Creatinine, Ser 1.13 (*)    GFR, Estimated 57 (*)    All other components within normal limits    EKG  Radiology No results found.  Procedures Procedures (including critical care time)  Medications Ordered in UC Medications - No data to display  Initial Impression / Assessment and Plan / UC Course  I have reviewed the triage vital signs and the nursing notes.  Pertinent labs & imaging results that were available during my care of the patient were reviewed by me and considered in my medical decision making (see chart for details).  Mildly  hypotensive but appears to be her baseline No red flags at this time. She is well appearing.  STAT CMP is pending If critical low needs to go to ED Will need to contact primary care to set up sooner appointment. Discussed if at any point symptoms worsen, needs to be evaluated in ED. Especially with any chest pain or tightness, shortness of breath, palpitations. Patient is agreeable to plan   5:05 PM lab called with critical result Potassium 2.7 Called patient, advised to go to emergency department for repletion and further evaluation. Patient is agreeable and reports she will head to [place].   Final Clinical Impressions(s) / UC Diagnoses   Final diagnoses:  Potassium  deficiency  Cramps of lower extremity     Discharge Instructions      I will call you if your metabolic panel has any abnormality If potassium is critically low you will need to go to the emergency department.  Please call primary care first thing Monday morning to make an earlier appointment for further evaluation      ED Prescriptions   None    PDMP not reviewed this encounter.   Priscila Bean, Lurena Joiner, [place] 12/07/22 1706

## 2022-12-07 NOTE — Telephone Encounter (Signed)
Message noted.  Pt currently in ER.  Sent Mychart message.

## 2022-12-07 NOTE — ED Triage Notes (Signed)
Pt was having SOB and foot cramping today. Pt went to UC and has potassium of 2.7 and was told to come here. Pt has history of low potassium.Denies chest pain.

## 2022-12-07 NOTE — Telephone Encounter (Signed)
  Chief Complaint: bilateral feet and hips cramps, bilateral hands tingling, requesting lab work to check potassium levels Symptoms: see above, headache, BP 100/70, blood sugar 155 today .  Frequency: yesterday  Pertinent Negatives: Patient denies chest pain or discomfort now. No difficulty breathing. No fever.  Disposition: [] ED /[] Urgent Care (no appt availability in office) / [] Appointment(In office/virtual)/ []  [place] [facility]/ [] Home Care/ [x] Refused Recommended Disposition /[] [facility]/ []  Follow-up with PCP Additional Notes:   Requesting labs to check potassium levels. Hx of low potassium . No available appt with any provider . Recommended UC . Patient would like to see if PCP recommended UC today . Patient requesting a call back. Please advise.      Reason for Disposition  Diabetes mellitus or weak immune system (e.g., HIV positive, cancer chemo, splenectomy, organ transplant, chronic steroids)  Answer Assessment - Initial Assessment Questions 1. ONSET: "When did the muscle aches or body pains start?"      Bilateral feet and hips  bilateral hands tingling  2. LOCATION: "What part of your body is hurting?" (e.g., entire body, arms, legs)      Bilateral feet and hips  3. SEVERITY: "How bad is the pain?" (Scale 1-10; or mild, moderate, severe)   - MILD (1-3): doesn't interfere with normal activities    - MODERATE (4-7): interferes with normal activities or awakens from sleep    - SEVERE (8-10):  excruciating pain, unable to do any normal activities      Na  4. CAUSE: "What do you think is causing the pains?"     Possible low potassium 5. FEVER: "Have you been having fever?"     na 6. OTHER SYMPTOMS: "Do you have any other symptoms?" (e.g., chest pain, weakness, rash, cold or flu symptoms, weight loss)     Headache, BP 100/70 blood sugar 155 cramping worsening hips and feet, hands tingling 7. PREGNANCY: "Is there any chance you are pregnant?" "When was your  last menstrual period?"     na 8. TRAVEL: "Have you traveled out of the country in the last month?" (e.g., travel history, exposures)     nan  Protocols used: Muscle Aches and Body Pain-A-AH

## 2022-12-07 NOTE — ED Triage Notes (Signed)
Patient here today with c/o cramping in her feet, chills, headache, and SOB since last night. She has a h/o CHF, a pacemaker, and low potassium. She takes 320 mg a day of potassium.

## 2022-12-08 ENCOUNTER — Encounter (HOSPITAL_COMMUNITY): Payer: Self-pay | Admitting: Internal Medicine

## 2022-12-08 DIAGNOSIS — R252 Cramp and spasm: Secondary | ICD-10-CM | POA: Diagnosis not present

## 2022-12-08 DIAGNOSIS — E869 Volume depletion, unspecified: Secondary | ICD-10-CM | POA: Diagnosis not present

## 2022-12-08 DIAGNOSIS — Z9109 Other allergy status, other than to drugs and biological substances: Secondary | ICD-10-CM | POA: Diagnosis not present

## 2022-12-08 DIAGNOSIS — R9431 Abnormal electrocardiogram [ECG] [EKG]: Secondary | ICD-10-CM | POA: Diagnosis not present

## 2022-12-08 DIAGNOSIS — K5090 Crohn's disease, unspecified, without complications: Secondary | ICD-10-CM | POA: Diagnosis not present

## 2022-12-08 DIAGNOSIS — E876 Hypokalemia: Secondary | ICD-10-CM | POA: Diagnosis not present

## 2022-12-08 DIAGNOSIS — E119 Type 2 diabetes mellitus without complications: Secondary | ICD-10-CM | POA: Diagnosis not present

## 2022-12-08 DIAGNOSIS — Z881 Allergy status to other antibiotic agents status: Secondary | ICD-10-CM | POA: Diagnosis not present

## 2022-12-08 DIAGNOSIS — F32A Depression, unspecified: Secondary | ICD-10-CM | POA: Diagnosis not present

## 2022-12-08 DIAGNOSIS — I2510 Atherosclerotic heart disease of native coronary artery without angina pectoris: Secondary | ICD-10-CM | POA: Diagnosis present

## 2022-12-08 DIAGNOSIS — N179 Acute kidney failure, unspecified: Secondary | ICD-10-CM | POA: Diagnosis not present

## 2022-12-08 DIAGNOSIS — Z5986 Financial insecurity: Secondary | ICD-10-CM | POA: Diagnosis not present

## 2022-12-08 DIAGNOSIS — Z79899 Other long term (current) drug therapy: Secondary | ICD-10-CM | POA: Diagnosis not present

## 2022-12-08 DIAGNOSIS — R0602 Shortness of breath: Secondary | ICD-10-CM | POA: Diagnosis not present

## 2022-12-08 DIAGNOSIS — Z90710 Acquired absence of both cervix and uterus: Secondary | ICD-10-CM | POA: Diagnosis not present

## 2022-12-08 DIAGNOSIS — Z86718 Personal history of other venous thrombosis and embolism: Secondary | ICD-10-CM | POA: Diagnosis not present

## 2022-12-08 DIAGNOSIS — F419 Anxiety disorder, unspecified: Secondary | ICD-10-CM | POA: Diagnosis present

## 2022-12-08 DIAGNOSIS — Z87891 Personal history of nicotine dependence: Secondary | ICD-10-CM | POA: Diagnosis not present

## 2022-12-08 DIAGNOSIS — E785 Hyperlipidemia, unspecified: Secondary | ICD-10-CM | POA: Diagnosis not present

## 2022-12-08 DIAGNOSIS — F909 Attention-deficit hyperactivity disorder, unspecified type: Secondary | ICD-10-CM | POA: Diagnosis present

## 2022-12-08 DIAGNOSIS — Z8249 Family history of ischemic heart disease and other diseases of the circulatory system: Secondary | ICD-10-CM | POA: Diagnosis not present

## 2022-12-08 DIAGNOSIS — I959 Hypotension, unspecified: Secondary | ICD-10-CM | POA: Diagnosis not present

## 2022-12-08 DIAGNOSIS — I5042 Chronic combined systolic (congestive) and diastolic (congestive) heart failure: Secondary | ICD-10-CM | POA: Diagnosis not present

## 2022-12-08 DIAGNOSIS — I252 Old myocardial infarction: Secondary | ICD-10-CM | POA: Diagnosis not present

## 2022-12-08 DIAGNOSIS — I5022 Chronic systolic (congestive) heart failure: Secondary | ICD-10-CM | POA: Diagnosis not present

## 2022-12-08 DIAGNOSIS — Z882 Allergy status to sulfonamides status: Secondary | ICD-10-CM | POA: Diagnosis not present

## 2022-12-08 DIAGNOSIS — Z95810 Presence of automatic (implantable) cardiac defibrillator: Secondary | ICD-10-CM | POA: Diagnosis not present

## 2022-12-08 DIAGNOSIS — Z885 Allergy status to narcotic agent status: Secondary | ICD-10-CM | POA: Diagnosis not present

## 2022-12-08 LAB — CBC
HCT: 42.5 % (ref 36.0–46.0)
Hemoglobin: 14.6 g/dL (ref 12.0–15.0)
MCH: 29.3 pg (ref 26.0–34.0)
MCHC: 34.4 g/dL (ref 30.0–36.0)
MCV: 85.2 fL (ref 80.0–100.0)
Platelets: 366 10*3/uL (ref 150–400)
RBC: 4.99 MIL/uL (ref 3.87–5.11)
RDW: 12.7 % (ref 11.5–15.5)
WBC: 9 10*3/uL (ref 4.0–10.5)
nRBC: 0 % (ref 0.0–0.2)

## 2022-12-08 LAB — URINALYSIS, ROUTINE W REFLEX MICROSCOPIC
Bilirubin Urine: NEGATIVE
Glucose, UA: >=500 mg/dL — AB
Hgb urine dipstick: NEGATIVE
Ketones, ur: NEGATIVE mg/dL
Leukocytes,Ua: NEGATIVE
Nitrite: NEGATIVE
Protein, ur: NEGATIVE mg/dL
Specific Gravity, Urine: 1.014 (ref 1.005–1.030)
pH: 7 (ref 5.0–8.0)

## 2022-12-08 LAB — PROTEIN / CREATININE RATIO, URINE
Creatinine, Urine: 48 mg/dL
Protein Creatinine Ratio: 0.19 mg/mg{Cre} — ABNORMAL HIGH (ref 0.00–0.15)
Total Protein, Urine: 9 mg/dL

## 2022-12-08 LAB — COMPREHENSIVE METABOLIC PANEL
ALT: 27 U/L (ref 0–44)
AST: 19 U/L (ref 15–41)
Albumin: 3.5 g/dL (ref 3.5–5.0)
Alkaline Phosphatase: 93 U/L (ref 38–126)
Anion gap: 13 (ref 5–15)
BUN: 22 mg/dL — ABNORMAL HIGH (ref 6–20)
CO2: 30 mmol/L (ref 22–32)
Calcium: 8.6 mg/dL — ABNORMAL LOW (ref 8.9–10.3)
Chloride: 89 mmol/L — ABNORMAL LOW (ref 98–111)
Creatinine, Ser: 1.02 mg/dL — ABNORMAL HIGH (ref 0.44–1.00)
GFR, Estimated: >60 mL/min (ref >60)
Glucose, Bld: 126 mg/dL — ABNORMAL HIGH (ref 70–99)
Potassium: 2.8 mmol/L — ABNORMAL LOW (ref 3.5–5.1)
Sodium: 132 mmol/L — ABNORMAL LOW (ref 135–145)
Total Bilirubin: 0.7 mg/dL (ref 0.3–1.2)
Total Protein: 6.5 g/dL (ref 6.5–8.1)

## 2022-12-08 LAB — BASIC METABOLIC PANEL
Anion gap: 7 (ref 5–15)
BUN: 22 mg/dL — ABNORMAL HIGH (ref 6–20)
CO2: 31 mmol/L (ref 22–32)
Calcium: 9.3 mg/dL (ref 8.9–10.3)
Chloride: 95 mmol/L — ABNORMAL LOW (ref 98–111)
Creatinine, Ser: 1.18 mg/dL — ABNORMAL HIGH (ref 0.44–1.00)
GFR, Estimated: 55 mL/min — ABNORMAL LOW (ref >60)
Glucose, Bld: 196 mg/dL — ABNORMAL HIGH (ref 70–99)
Potassium: 4.3 mmol/L (ref 3.5–5.1)
Sodium: 133 mmol/L — ABNORMAL LOW (ref 135–145)

## 2022-12-08 LAB — HEMOGLOBIN A1C
Hgb A1c MFr Bld: 7.1 % — ABNORMAL HIGH (ref 4.8–5.6)
Mean Plasma Glucose: 157.07 mg/dL

## 2022-12-08 LAB — GLUCOSE, CAPILLARY
Glucose-Capillary: 197 mg/dL — ABNORMAL HIGH (ref 70–99)
Glucose-Capillary: 138 mg/dL — ABNORMAL HIGH (ref 70–99)
Glucose-Capillary: 122 mg/dL — ABNORMAL HIGH (ref 70–99)
Glucose-Capillary: 114 mg/dL — ABNORMAL HIGH (ref 70–99)
Glucose-Capillary: 138 mg/dL — ABNORMAL HIGH (ref 70–99)

## 2022-12-08 MED ORDER — METOLAZONE 2.5 MG PO TABS: 2.5000 mg | ORAL_TABLET | Freq: Every day | ORAL | Status: DC | PRN

## 2022-12-08 MED ORDER — ALBUTEROL SULFATE (2.5 MG/3ML) 0.083% IN NEBU: 2.5000 mg | INHALATION_SOLUTION | Freq: Four times a day (QID) | RESPIRATORY_TRACT | Status: DC | PRN

## 2022-12-08 MED ORDER — IPRATROPIUM BROMIDE 0.02 % IN SOLN: 0.5000 mg | Freq: Four times a day (QID) | RESPIRATORY_TRACT | Status: DC | PRN

## 2022-12-08 MED ORDER — ATORVASTATIN CALCIUM 80 MG PO TABS
80.0000 mg | ORAL_TABLET | Freq: Every day | ORAL | Status: DC
  Administered 2022-12-08 – 2022-12-09 (×2): 80 mg via ORAL
  Filled 2022-12-08 (×2): qty 1

## 2022-12-08 MED ORDER — IVABRADINE HCL 5 MG PO TABS
5.0000 mg | ORAL_TABLET | Freq: Two times a day (BID) | ORAL | Status: DC
  Administered 2022-12-08 – 2022-12-09 (×3): 5 mg via ORAL
  Filled 2022-12-08 (×4): qty 1

## 2022-12-08 MED ORDER — METOPROLOL SUCCINATE ER 25 MG PO TB24
25.0000 mg | ORAL_TABLET | Freq: Every day | ORAL | Status: DC
  Filled 2022-12-08: qty 1

## 2022-12-08 MED ORDER — POTASSIUM CHLORIDE 20 MEQ PO PACK
80.0000 meq | PACK | Freq: Four times a day (QID) | ORAL | Status: DC
  Administered 2022-12-08 – 2022-12-09 (×6): 80 meq via ORAL
  Filled 2022-12-08 (×6): qty 4

## 2022-12-08 MED ORDER — HYDROCODONE-ACETAMINOPHEN 5-325 MG PO TABS: 1.0000 | ORAL_TABLET | ORAL | Status: DC | PRN

## 2022-12-08 MED ORDER — ZOLPIDEM TARTRATE 5 MG PO TABS
5.0000 mg | ORAL_TABLET | Freq: Every evening | ORAL | Status: DC | PRN
  Administered 2022-12-08 (×2): 5 mg via ORAL
  Filled 2022-12-08 (×2): qty 1

## 2022-12-08 MED ORDER — BISACODYL 5 MG PO TBEC: 5.0000 mg | DELAYED_RELEASE_TABLET | Freq: Every day | ORAL | Status: DC | PRN

## 2022-12-08 MED ORDER — ACETAMINOPHEN 650 MG RE SUPP: 650.0000 mg | Freq: Four times a day (QID) | RECTAL | Status: DC | PRN

## 2022-12-08 MED ORDER — ALPRAZOLAM 0.5 MG PO TABS
2.0000 mg | ORAL_TABLET | Freq: Three times a day (TID) | ORAL | Status: DC
  Administered 2022-12-08 – 2022-12-09 (×5): 2 mg via ORAL
  Filled 2022-12-08 (×5): qty 4

## 2022-12-08 MED ORDER — SENNOSIDES-DOCUSATE SODIUM 8.6-50 MG PO TABS: 1.0000 | ORAL_TABLET | Freq: Every evening | ORAL | Status: DC | PRN

## 2022-12-08 MED ORDER — ONDANSETRON HCL 4 MG PO TABS: 4.0000 mg | ORAL_TABLET | Freq: Four times a day (QID) | ORAL | Status: DC | PRN

## 2022-12-08 MED ORDER — POTASSIUM CHLORIDE 10 MEQ/100ML IV SOLN
10.0000 meq | INTRAVENOUS | Status: AC
  Administered 2022-12-08 (×4): 10 meq via INTRAVENOUS
  Filled 2022-12-08 (×4): qty 100

## 2022-12-08 MED ORDER — TRAZODONE HCL 50 MG PO TABS: 25.0000 mg | ORAL_TABLET | Freq: Every evening | ORAL | Status: DC | PRN

## 2022-12-08 MED ORDER — ENSURE ENLIVE PO LIQD
237.0000 mL | Freq: Two times a day (BID) | ORAL | Status: DC
  Administered 2022-12-09: 237 mL via ORAL

## 2022-12-08 MED ORDER — APIXABAN 5 MG PO TABS
5.0000 mg | ORAL_TABLET | Freq: Two times a day (BID) | ORAL | Status: DC
  Administered 2022-12-08 – 2022-12-09 (×3): 5 mg via ORAL
  Filled 2022-12-08 (×3): qty 1

## 2022-12-08 MED ORDER — SPIRONOLACTONE 25 MG PO TABS
25.0000 mg | ORAL_TABLET | Freq: Every day | ORAL | Status: DC
  Administered 2022-12-08 – 2022-12-09 (×2): 25 mg via ORAL
  Filled 2022-12-08 (×2): qty 1

## 2022-12-08 MED ORDER — ADULT MULTIVITAMIN W/MINERALS CH
1.0000 | ORAL_TABLET | Freq: Every day | ORAL | Status: DC
  Administered 2022-12-08 – 2022-12-09 (×2): 1 via ORAL
  Filled 2022-12-08 (×2): qty 1

## 2022-12-08 MED ORDER — LINAGLIPTIN 5 MG PO TABS
5.0000 mg | ORAL_TABLET | Freq: Every day | ORAL | Status: DC
  Administered 2022-12-08 – 2022-12-09 (×2): 5 mg via ORAL
  Filled 2022-12-08 (×2): qty 1

## 2022-12-08 MED ORDER — ACETAMINOPHEN 325 MG PO TABS
650.0000 mg | ORAL_TABLET | Freq: Four times a day (QID) | ORAL | Status: DC | PRN
  Administered 2022-12-08 (×2): 650 mg via ORAL
  Filled 2022-12-08 (×2): qty 2

## 2022-12-08 MED ORDER — CYCLOBENZAPRINE HCL 10 MG PO TABS
20.0000 mg | ORAL_TABLET | Freq: Every day | ORAL | Status: DC
  Administered 2022-12-08: 20 mg via ORAL
  Filled 2022-12-08: qty 2

## 2022-12-08 MED ORDER — INSULIN ASPART 100 UNIT/ML IJ SOLN: 0.0000 [IU] | Freq: Every day | INTRAMUSCULAR | Status: DC

## 2022-12-08 MED ORDER — HYDRALAZINE HCL 20 MG/ML IJ SOLN: 5.0000 mg | Freq: Three times a day (TID) | INTRAMUSCULAR | Status: DC | PRN

## 2022-12-08 MED ORDER — INSULIN ASPART 100 UNIT/ML IJ SOLN
0.0000 [IU] | Freq: Three times a day (TID) | INTRAMUSCULAR | Status: DC
  Administered 2022-12-08 – 2022-12-09 (×2): 2 [IU] via SUBCUTANEOUS

## 2022-12-08 MED ORDER — ONDANSETRON HCL 4 MG/2ML IJ SOLN: 4.0000 mg | Freq: Four times a day (QID) | INTRAMUSCULAR | Status: DC | PRN

## 2022-12-08 MED ORDER — SODIUM CHLORIDE 0.9% FLUSH
3.0000 mL | Freq: Two times a day (BID) | INTRAVENOUS | Status: DC
  Administered 2022-12-08 – 2022-12-09 (×3): 3 mL via INTRAVENOUS

## 2022-12-08 MED ORDER — PANCRELIPASE (LIP-PROT-AMYL) 36000-114000 UNITS PO CPEP
36000.0000 [IU] | ORAL_CAPSULE | Freq: Three times a day (TID) | ORAL | Status: DC
  Administered 2022-12-08 – 2022-12-09 (×5): 36000 [IU] via ORAL
  Filled 2022-12-08 (×5): qty 1

## 2022-12-08 MED ORDER — FUROSEMIDE 40 MG PO TABS: 80.0000 mg | ORAL_TABLET | Freq: Two times a day (BID) | ORAL | Status: DC

## 2022-12-08 MED ORDER — BUPROPION HCL ER (XL) 150 MG PO TB24
300.0000 mg | ORAL_TABLET | Freq: Every day | ORAL | Status: DC
  Administered 2022-12-08 – 2022-12-09 (×2): 300 mg via ORAL
  Filled 2022-12-08 (×2): qty 2

## 2022-12-08 MED ORDER — POTASSIUM CHLORIDE 40 MEQ/15ML (20%) PO SOLN: 160.0000 meq | Freq: Two times a day (BID) | ORAL | Status: DC

## 2022-12-08 NOTE — H&P (Addendum)
History and Physical   [facility] - [place] @ [facility] Admission History and Physical [employer], D.O.    Patient Name: Christine [name] MR#: [id number] Date of Birth: October 13, 1965 Date of Admission: 12/07/2022  Referring MD/NP/PA: Corliss Skains Dr. Sherrilee Gilles Primary Care Physician: Marcine Matar, MD  Chief Complaint:  Chief Complaint  Patient presents with   Abnormal Lab   hypokalemia    hypokalemia    HPI: Christine [name] is a 57 y.o. 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  Patient was in a usual state of health until 1 night prior to arrival in the emergency department when she developed cramping in her legs her feet her hips associated with a headache.  Similar symptoms have occurred in the past when she had low potassium.  She is currently taking 160 mEq of potassium twice daily.  She was scheduled to see a nephrologist but has not had the appointment yet.  Patient denies fevers/chills, weakness, dizziness, chest pain, shortness of breath, N/V/C/D, abdominal pain, dysuria/frequency, changes in mental status.    Otherwise there has been no change in status. Patient has been taking medication as prescribed and there has been no recent change in medication or diet.  No recent antibiotics.  There has been no recent illness, hospitalizations, travel or sick contacts.    EMS/ED Course: Patient received NS + KCl. Medical admission has been requested for further management of severe hypokalemia, prolonged QT  Review of Systems:  CONSTITUTIONAL: Positive headache.  No fever/chills, fatigue, weakness, weight gain/loss,  EYES: No blurry or double vision. ENT: No tinnitus, postnasal drip, redness or soreness of the oropharynx. RESPIRATORY: No cough, dyspnea, wheeze.  No hemoptysis.  CARDIOVASCULAR: No chest pain,  palpitations, syncope, orthopnea. No lower extremity edema.  GASTROINTESTINAL: No nausea, vomiting, abdominal pain, diarrhea, constipation.  No hematemesis, melena or hematochezia. GENITOURINARY: No dysuria, frequency, hematuria. ENDOCRINE: No polyuria or nocturia. No heat or cold intolerance. HEMATOLOGY: No anemia, bruising, bleeding. INTEGUMENTARY: No rashes, ulcers, lesions. MUSCULOSKELETAL: Positive cramping No arthritis, gout. NEUROLOGIC: No numbness, tingling, ataxia, seizure-type activity, weakness. PSYCHIATRIC: No anxiety, depression, insomnia.   Past Medical History:  Diagnosis Date   Allergy    Anxiety 01/20/2021   CAD in native artery 08/24/2021   CHF (congestive heart failure) (HCC)    Chronic combined systolic and diastolic heart failure (HCC) 08/24/2021   Complication of anesthesia    Depression    Hyperlipidemia    Myocardial infarction [facility])    Presence of cardiac defibrillator 07/2021   Shortness of breath 01/20/2021   Sleep apnea 07/2021   Tobacco abuse 01/20/2021    Past Surgical History:  Procedure Laterality Date   ABDOMINAL HYSTERECTOMY     BIOPSY  02/26/2022   Procedure: BIOPSY;  Surgeon: Shellia Cleverly, DO;  Location: [facility] ENDOSCOPY;  Service: Gastroenterology;;   CARDIAC CATHETERIZATION     CHOLECYSTECTOMY     COLONOSCOPY WITH PROPOFOL N/A 02/26/2022   Procedure: COLONOSCOPY WITH PROPOFOL;  Surgeon: Shellia Cleverly, DO;  Location: [facility] ENDOSCOPY;  Service: Gastroenterology;  Laterality: N/A;   ESOPHAGOGASTRODUODENOSCOPY (EGD) WITH PROPOFOL N/A 02/26/2022   Procedure: ESOPHAGOGASTRODUODENOSCOPY (EGD) WITH PROPOFOL;  Surgeon: Shellia Cleverly, DO;  Location: [facility] ENDOSCOPY;  Service: Gastroenterology;  Laterality: N/A;   ICD IMPLANT N/A 07/21/2021   Procedure: ICD IMPLANT;  Surgeon: Duke Salvia, MD;  Location: [facility]  INVASIVE CV LAB;  Service: Cardiovascular;  Laterality: N/A;   NECK SURGERY     OVARIAN CYST SURGERY     POLYPECTOMY  02/26/2022    Procedure: POLYPECTOMY;  Surgeon: Shellia Cleverly, DO;  Location: [facility] ENDOSCOPY;  Service: Gastroenterology;;   RIGHT/LEFT HEART CATH AND CORONARY ANGIOGRAPHY N/A 01/23/2021   Procedure: RIGHT/LEFT HEART CATH AND CORONARY ANGIOGRAPHY;  Surgeon: Dolores Patty, MD;  Location: [facility] INVASIVE CV LAB;  Service: Cardiovascular;  Laterality: N/A;     reports that she quit smoking about 22 months ago. Her smoking use included cigarettes. She has never used smokeless tobacco. She reports current alcohol use. She reports that she does not use drugs.  Allergies  Allergen Reactions   Sulfa Antibiotics Hives, Itching and Rash   Erythromycin Hives   Tramadol Rash and Other (See Comments)    Urinary retention     Ibuprofen Itching   Tape Rash    Family History  Problem Relation Age of Onset   Stroke Mother    Atrial fibrillation Mother    Heart failure Father    Stomach cancer Maternal Grandfather    Liver cancer Neg Hx    Esophageal cancer Neg Hx    Colon polyps Neg Hx    Colon cancer Neg Hx     Prior to Admission medications   Medication Sig Start Date End Date Taking? Authorizing Provider  Accu-Chek Softclix Lancets lancets Use as instructed.  Check blood sugar daily before breakfast. 11/02/22   Marcine Matar, MD  acetaminophen (TYLENOL) 500 MG tablet Take 1,000 mg by mouth 3 (three) times daily as needed for moderate pain or headache.    [provider]  ALPRAZolam Prudy Feeler) 1 MG tablet Take 2 mg by mouth 3 (three) times daily.    [provider]  alum & mag hydroxide-simeth (MAALOX/MYLANTA) 200-200-20 MG/5ML suspension Take 30 mLs by mouth every 4 (four) hours as needed for indigestion or heartburn. 03/02/22   Atway, Rayann N, DO  apixaban (ELIQUIS) 5 MG TABS tablet Take 1 tablet (5 mg total) by mouth 2 (two) times daily. 11/05/22   Bensimhon, Bevelyn Buckles, MD  atorvastatin (LIPITOR) 80 MG tablet Take 1 tablet (80 mg total) by mouth daily. 01/19/22   Laurey Morale,  MD  Blood Glucose Monitoring Suppl (ACCU-CHEK GUIDE) w/Device KIT Check blood sugar once daily before breakfast 11/02/22   Marcine Matar, MD  buPROPion (WELLBUTRIN XL) 150 MG 24 hr tablet Take 300 mg by mouth daily.    [provider]  cyclobenzaprine (FLEXERIL) 10 MG tablet Take 20 mg by mouth at bedtime.    [provider]  dapagliflozin propanediol (FARXIGA) 10 MG TABS tablet Take 1 tablet (10 mg total) by mouth daily. 10/23/22   Bensimhon, Bevelyn Buckles, MD  diphenhydrAMINE (BENADRYL) 25 MG tablet Take 50 mg by mouth as needed for allergies.    [provider]  diphenoxylate-atropine (LOMOTIL) 2.5-0.025 MG tablet Take 2 tablets by mouth 4 (four) times daily as needed for diarrhea/loose stools. 11/19/22   Cirigliano, Vito V, DO  diphenoxylate-atropine (LOMOTIL) 2.5-0.025 MG tablet Take 2 tablets by mouth 4 (four) times daily as needed for diarrhea/loose stools. 11/19/22     feeding supplement (ENSURE ENLIVE / ENSURE PLUS) LIQD Take 237 mLs by mouth 2 (two) times daily between meals. 03/03/22   Atway, Rayann N, DO  Fezolinetant (VEOZAH) 45 MG TABS Take 1 tablet (45 mg total) by mouth daily. 11/08/22   Marcine Matar, MD  furosemide (LASIX) 40 MG tablet Take 2 tablets (80 mg total) by mouth in the morning and every evening 11/30/22   Bensimhon, Bevelyn Buckles, MD  glucose blood (ACCU-CHEK GUIDE) test strip Use as instructed to check blood sugar daily before breakfast 11/02/22   Marcine Matar, MD  ivabradine (CORLANOR) 5 MG TABS tablet Take 1 tablet (5 mg total) by mouth 2 (two) times daily. 11/08/22   Marcine Matar, MD  lipase/protease/amylase (CREON) 36000 UNITS CPEP capsule Take 2 capsules ( 72000 units) with meals and 1 capsule ( 36000 units) with snack. Patient taking differently: 36,000 Units with breakfast, with lunch, and with evening meal. Take 1 tablet before breakfast 03/13/22   Cirigliano, Vito V, DO  metolazone (ZAROXOLYN) 2.5 MG tablet Take 1 tablet (2.5 mg total)  by mouth as directed by [facility] 11/20/22   Jacklynn Ganong, FNP  metoprolol succinate (TOPROL-XL) 25 MG 24 hr tablet Take 1 tablet (25 mg total) by mouth daily. Take with or immediately following a meal. 07/05/22   Bensimhon, Bevelyn Buckles, MD  metroNIDAZOLE (METROGEL) 1 % gel Apply topically as needed.    [provider]  Multiple Vitamins-Minerals (MULTIVITAMIN WITH MINERALS) tablet Take 1 tablet by mouth daily.    [provider]  oxymetazoline (AFRIN) 0.05 % nasal spray Place 1-2 sprays into both nostrils 2 (two) times daily as needed for congestion.    [provider]  permethrin (ELIMITE) 5 % cream Apply from neck down before bed then wash off in the morning. May repeat in one week. 10/31/22   Mardella Layman, MD  Potassium Chloride 40 MEQ/15ML (20%) SOLN Take 60 ml (160 mEq) by mouth 2 (two) times daily. 11/20/22   Marcine Matar, MD  predniSONE (STERAPRED UNI-PAK 21 TAB) 10 MG (21) TBPK tablet Take by mouth daily. Take 6 tabs by mouth daily  for 2 days, then 5 tabs for 2 days, then 4 tabs for 2 days, then 3 tabs for 2 days, 2 tabs for 2 days, then 1 tab by mouth daily for 2 days 11/09/22   Jeanelle Malling, PA  promethazine (PHENERGAN) 25 MG tablet Take 1 tablet (25 mg total) by mouth every 6 (six) to 8 (eight) hours for nausea or vomiting. 11/22/22   Cirigliano, Vito V, DO  sitaGLIPtin (JANUVIA) 50 MG tablet Take 1 tablet (50 mg total) by mouth daily. 11/02/22   Marcine Matar, MD  spironolactone (ALDACTONE) 25 MG tablet Take 1 tablet (25 mg total) by mouth daily. 11/30/22   Bensimhon, Bevelyn Buckles, MD  zolpidem (AMBIEN) 10 MG tablet Take 5-10 mg by mouth at bedtime.    [provider]    Physical Exam: Vitals:   12/07/22 2100 12/07/22 2230 12/07/22 2300 12/08/22 0013  BP: 120/64 105/66 (!) 104/54 (!) 102/49  Pulse: 76 79 76 71  Resp: 17 17 16 19   Temp:  98.1 F (36.7 C)  98.4 F (36.9 C)  TempSrc:  Oral  Oral  SpO2: 93% 99% 97% 97%  Weight:       Height:        GENERAL: 57 y.o.-year-old WF patient, well-developed, well-nourished lying in the bed in no acute distress.  Pleasant and cooperative.   HEENT: Head atraumatic, normocephalic. Pupils equal. Mucus membranes moist. NECK: Supple. No JVD. CHEST: Normal breath sounds bilaterally. No wheezing, rales, rhonchi or crackles. No use of accessory muscles of respiration.  No reproducible chest wall tenderness.  CARDIOVASCULAR: S1, S2 normal. No murmurs,  rubs, or gallops. Cap refill <2 seconds. Pulses intact distally.  ABDOMEN: Soft, nondistended, nontender. No rebound, guarding, rigidity. Normoactive bowel sounds present in all four quadrants.  EXTREMITIES: No pedal edema, cyanosis, or clubbing. No calf tenderness or Homan's sign.  NEUROLOGIC: The patient is alert and oriented x 3. Cranial nerves II through XII are grossly intact with no focal sensorimotor deficit. PSYCHIATRIC:  Normal affect, mood, thought content. SKIN: Warm, dry, and intact without obvious rash, lesion, or ulcer.    Labs on Admission:  CBC: No results for input(s): "WBC", "NEUTROABS", "HGB", "HCT", "MCV", "PLT" in the last 168 hours. Basic Metabolic Panel: Recent Labs  Lab 12/07/22 1534 12/07/22 2012  NA 129* 133*  K 2.7* 2.3*  CL 84* 83*  CO2 32 38*  GLUCOSE 148* 128*  BUN 25* 32*  CREATININE 1.13* 1.31*  CALCIUM 9.4 10.4*  MG  --  2.1   GFR: Estimated Creatinine Clearance: 52.5 mL/min (A) (by C-G formula based on SCr of 1.31 mg/dL (H)). Liver Function Tests: Recent Labs  Lab 12/07/22 1534  AST 23  ALT 31  ALKPHOS 115  BILITOT 0.9  PROT 7.6  ALBUMIN 4.1   No results for input(s): "LIPASE", "AMYLASE" in the last 168 hours. No results for input(s): "AMMONIA" in the last 168 hours. Coagulation Profile: No results for input(s): "INR", "PROTIME" in the last 168 hours. Cardiac Enzymes: No results for input(s): "CKTOTAL", "CKMB", "CKMBINDEX", "TROPONINI" in the last 168 hours. BNP (last 3  results) Recent Labs    01/17/22 1512  PROBNP 328*   HbA1C: No results for input(s): "HGBA1C" in the last 72 hours. CBG: No results for input(s): "GLUCAP" in the last 168 hours. Lipid Profile: No results for input(s): "CHOL", "HDL", "LDLCALC", "TRIG", "CHOLHDL", "LDLDIRECT" in the last 72 hours. Thyroid Function Tests: No results for input(s): "TSH", "T4TOTAL", "FREET4", "T3FREE", "THYROIDAB" in the last 72 hours. Anemia Panel: No results for input(s): "VITAMINB12", "FOLATE", "FERRITIN", "TIBC", "IRON", "RETICCTPCT" in the last 72 hours. Urine analysis:    Component Value Date/Time   COLORURINE COLORLESS (A) 11/09/2022 1850   APPEARANCEUR CLEAR 11/09/2022 1850   APPEARANCEUR Clear 10/26/2021 1016   LABSPEC 1.006 11/09/2022 1850   PHURINE 5.0 11/09/2022 1850   GLUCOSEU 500 (A) 11/09/2022 1850   HGBUR NEGATIVE 11/09/2022 1850   BILIRUBINUR NEGATIVE 11/09/2022 1850   BILIRUBINUR negative 10/31/2022 1400   BILIRUBINUR Negative 10/26/2021 1016   KETONESUR NEGATIVE 11/09/2022 1850   PROTEINUR NEGATIVE 11/09/2022 1850   UROBILINOGEN 0.2 10/31/2022 1400   UROBILINOGEN 0.2 09/01/2020 1539   NITRITE NEGATIVE 11/09/2022 1850   LEUKOCYTESUR NEGATIVE 11/09/2022 1850   Sepsis Labs: @LABRCNTIP (procalcitonin:4,lacticidven:4) ) Recent Results (from the past 240 hour(s))  Calprotectin, Fecal     Status: None   Collection Time: 11/30/22  4:46 PM   Specimen: Stool   Stool  Result Value Ref Range Status   Calprotectin, Fecal 45 0 - 120 ug/g Final    Comment: Concentration     Interpretation   Follow-Up < 5 - 50 ug/g     Normal           None >50 -120 ug/g     Borderline       Re-evaluate in 4-6 weeks     >120 ug/g     Abnormal         Repeat as clinically  indicated      Radiological Exams on Admission: No results found.  EKG: Normal sinus rhythm at 85 bpm with normal axis, old infarcts, prolonged QT and nonspecific ST-T wave changes.    Assessment/Plan  This is a 56 y.o. 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 Severe hypokalemia with EKG changes - Admit obs, tele - Replace KCl -continue oral dose and supplement intravenously -Check K every 6 hours - Check mag level - Nephro consult for AM - Pharmacy consult -Hold Lasix for now  #. Acute kidney injury  - Nephro consult given above.   #. History of CAD - Continue Lipitor  #. History of HFrEF - Continue metolazone, metoprolol, spironolactone, ivabradine -Hold Lasix for now  #. History of depression, anxiety - Continue Xanax, bupropion, ambien  #.  History of LV thrombus - Continue Eliquis   Admission status: Ob tele IV Fluids: HL Diet/Nutrition: Heart healthy carb controlled Consults called: Nephro  DVT Px:  Eliquis, SCDs and early ambulation. Code Status: Full Code  Disposition Plan: To home in less than 24 hours  All the records are reviewed and case discussed with ED provider. Management plans discussed with the patient and/or family who express understanding and agree with plan of care.  [employer] D.O. on 12/08/2022 at 12:55 AM CC: Primary care physician; Marcine Matar, MD   12/08/2022, 12:55 AM

## 2022-12-08 NOTE — Progress Notes (Signed)
New Admission Note:  Arrival Method: By [facility] from [facility] at this time Mental Orientation: Alert and oriented x 4 Telemetry: [address], CCMD notified Assessment: Completed Skin: Completed, refer to flowsheets IV: Right forearm Pain: Cramping in hands and feet Tubes: None Safety Measures: Safety Fall Prevention Plan was given, discussed and signed. Admission: Completed [facility] Orientation: Patient has been orientated to the room, unit and the staff. Family: None  Orders have been reviewed and implemented. Will continue to monitor the patient. Call light has been placed within reach and bed alarm has been activated.   Franky Macho, RN  Phone Number: [phone]

## 2022-12-08 NOTE — Consult Note (Signed)
Renal Service Consult Note [facility]  Christine [name] 12/08/2022 Christine Krabbe, MD Requesting Physician: Dr. Benjamine Mola  Reason for Consult: Hypokalemia  HPI: The patient is a 57 y.o. year-old w/ PMH as below who presented to ED for cramping in the feet, chills, headache and SOB x 1 day. Hx of DM2, depression, anxiety, CAD, Crohn's, HFrEF w/ PPM/ AICD and low potassium. Takes potassium at 320mg  per day. Was scheduled to see a nephrologist but hasn't had the appt yet. In ED Na+ 132, K 2.8  CL 89  CO2 30   glu 126 BUN 22 and creat 1.02.  eGFR > 60.  Ca 8.6, alb 3.5  LFT's wnl.  WBC 9K  Hb 14.6.   Pt seen in her room.    8/23 KCL IV 10mg  x 4 yesterday 8/24 KCL IV 10mg  x 2 today Aldactone 25 every day started 8/24 Getting po klor-con packet here 80 mEq every 6 hrs Got Klor-con 160 meq x 1 po yesterday evening  She says she has been taking lasix since her "widow-maker" MI in 2022, she is f/b Dr Gala Romney w/ CHF team. She has been taking the extra KCl since last November 2023, currently is on bid at home. Lasix dosing hasn't changed much the last few months. She doesn't swell in the legs but if she doesn't take the lasix she gets "fullness around her heart". She takes the metolazone 2.5 bid PRN for weight gain of 5 lbs or more.     ROS - denies CP, no joint pain, no HA, no blurry vision, no rash, no diarrhea, no nausea/ vomiting, no dysuria, no difficulty voiding   Past Medical History  Past Medical History:  Diagnosis Date   Allergy    Anxiety 01/20/2021   CAD in native artery 08/24/2021   CHF (congestive heart failure) (HCC)    Chronic combined systolic and diastolic heart failure (HCC) 08/24/2021   Complication of anesthesia    Depression    Hyperlipidemia    Myocardial infarction [facility])    Presence of cardiac defibrillator 07/2021   Shortness of breath 01/20/2021   Sleep apnea 07/2021   Tobacco abuse 01/20/2021   Past Surgical History  Past Surgical  History:  Procedure Laterality Date   ABDOMINAL HYSTERECTOMY     BIOPSY  02/26/2022   Procedure: BIOPSY;  Surgeon: Shellia Cleverly, DO;  Location: [facility];  Service: Gastroenterology;;   CARDIAC CATHETERIZATION     CHOLECYSTECTOMY     COLONOSCOPY WITH PROPOFOL N/A 02/26/2022   Procedure: COLONOSCOPY WITH PROPOFOL;  Surgeon: Shellia Cleverly, DO;  Location: [facility];  Service: Gastroenterology;  Laterality: N/A;   ESOPHAGOGASTRODUODENOSCOPY (EGD) WITH PROPOFOL N/A 02/26/2022   Procedure: ESOPHAGOGASTRODUODENOSCOPY (EGD) WITH PROPOFOL;  Surgeon: Shellia Cleverly, DO;  Location: [facility];  Service: Gastroenterology;  Laterality: N/A;   ICD IMPLANT N/A 07/21/2021   Procedure: ICD IMPLANT;  Surgeon: Duke Salvia, MD;  Location: [facility] INVASIVE CV LAB;  Service: Cardiovascular;  Laterality: N/A;   NECK SURGERY     OVARIAN CYST SURGERY     POLYPECTOMY  02/26/2022   Procedure: POLYPECTOMY;  Surgeon: Shellia Cleverly, DO;  Location: [facility];  Service: Gastroenterology;;   RIGHT/LEFT HEART CATH AND CORONARY ANGIOGRAPHY N/A 01/23/2021   Procedure: RIGHT/LEFT HEART CATH AND CORONARY ANGIOGRAPHY;  Surgeon: Dolores Patty, MD;  Location: [facility] INVASIVE CV LAB;  Service: Cardiovascular;  Laterality: N/A;   Family History  Family History  Problem Relation Age of Onset  Stroke Mother    Atrial fibrillation Mother    Heart failure Father    Stomach cancer Maternal Grandfather    Liver cancer Neg Hx    Esophageal cancer Neg Hx    Colon polyps Neg Hx    Colon cancer Neg Hx    Social History  reports that she quit smoking about 22 months ago. Her smoking use included cigarettes. She has never used smokeless tobacco. She reports current alcohol use. She reports that she does not use drugs. Allergies  Allergies  Allergen Reactions   Sulfa Antibiotics Hives, Itching and Rash   Erythromycin Hives   Tramadol Rash and Other (See Comments)    Urinary retention     Ibuprofen  Itching   Tape Rash   Home medications Prior to Admission medications   Medication Sig Start Date End Date Taking? Authorizing Provider  prazosin (MINIPRESS) 2 MG capsule Take 2-6 mg by mouth at bedtime. 10/24/22  Yes [provider]  Accu-Chek Softclix Lancets lancets Use as instructed.  Check blood sugar daily before breakfast. 11/02/22   Marcine Matar, MD  acetaminophen (TYLENOL) 500 MG tablet Take 1,000 mg by mouth 3 (three) times daily as needed for moderate pain or headache.    [provider]  ALPRAZolam Prudy Feeler) 1 MG tablet Take 2 mg by mouth 3 (three) times daily.    [provider]  alum & mag hydroxide-simeth (MAALOX/MYLANTA) 200-200-20 MG/5ML suspension Take 30 mLs by mouth every 4 (four) hours as needed for indigestion or heartburn. 03/02/22   Atway, Rayann N, DO  apixaban (ELIQUIS) 5 MG TABS tablet Take 1 tablet (5 mg total) by mouth 2 (two) times daily. 11/05/22   Bensimhon, Bevelyn Buckles, MD  atorvastatin (LIPITOR) 80 MG tablet Take 1 tablet (80 mg total) by mouth daily. 01/19/22   Laurey Morale, MD  Blood Glucose Monitoring Suppl (ACCU-CHEK GUIDE) w/Device KIT Check blood sugar once daily before breakfast 11/02/22   Marcine Matar, MD  buPROPion (WELLBUTRIN XL) 150 MG 24 hr tablet Take 300 mg by mouth daily.    [provider]  cyclobenzaprine (FLEXERIL) 10 MG tablet Take 20 mg by mouth at bedtime.    [provider]  dapagliflozin propanediol (FARXIGA) 10 MG TABS tablet Take 1 tablet (10 mg total) by mouth daily. 10/23/22   Bensimhon, Bevelyn Buckles, MD  diphenhydrAMINE (BENADRYL) 25 MG tablet Take 50 mg by mouth as needed for allergies.    [provider]  diphenoxylate-atropine (LOMOTIL) 2.5-0.025 MG tablet Take 2 tablets by mouth 4 (four) times daily as needed for diarrhea/loose stools. 11/19/22   Cirigliano, Vito V, DO  diphenoxylate-atropine (LOMOTIL) 2.5-0.025 MG tablet Take 2 tablets by mouth 4 (four) times daily as needed for  diarrhea/loose stools. 11/19/22     feeding supplement (ENSURE ENLIVE / ENSURE PLUS) LIQD Take 237 mLs by mouth 2 (two) times daily between meals. 03/03/22   Atway, Rayann N, DO  Fezolinetant (VEOZAH) 45 MG TABS Take 1 tablet (45 mg total) by mouth daily. 11/08/22   Marcine Matar, MD  furosemide (LASIX) 40 MG tablet Take 2 tablets (80 mg total) by mouth in the morning and every evening 11/30/22   Bensimhon, Bevelyn Buckles, MD  glucose blood (ACCU-CHEK GUIDE) test strip Use as instructed to check blood sugar daily before breakfast 11/02/22   Marcine Matar, MD  ivabradine (CORLANOR) 5 MG TABS tablet Take 1 tablet (5 mg total) by mouth 2 (two) times daily. 11/08/22  Marcine Matar, MD  lipase/protease/amylase (CREON) 36000 UNITS CPEP capsule Take 2 capsules ( 72000 units) with meals and 1 capsule ( 36000 units) with snack. Patient taking differently: 36,000 Units with breakfast, with lunch, and with evening meal. Take 1 tablet before breakfast 03/13/22   Cirigliano, Vito V, DO  metolazone (ZAROXOLYN) 2.5 MG tablet Take 1 tablet (2.5 mg total) by mouth as directed by [facility] 11/20/22   Jacklynn Ganong, FNP  metoprolol succinate (TOPROL-XL) 25 MG 24 hr tablet Take 1 tablet (25 mg total) by mouth daily. Take with or immediately following a meal. 07/05/22   Bensimhon, Bevelyn Buckles, MD  metroNIDAZOLE (METROGEL) 1 % gel Apply topically as needed.    [provider]  Multiple Vitamins-Minerals (MULTIVITAMIN WITH MINERALS) tablet Take 1 tablet by mouth daily.    [provider]  oxymetazoline (AFRIN) 0.05 % nasal spray Place 1-2 sprays into both nostrils 2 (two) times daily as needed for congestion.    [provider]  permethrin (ELIMITE) 5 % cream Apply from neck down before bed then wash off in the morning. May repeat in one week. 10/31/22   Mardella Layman, MD  Potassium Chloride 40 MEQ/15ML (20%) SOLN Take 60 ml (160 mEq) by mouth 2 (two) times daily. 11/20/22    Marcine Matar, MD  predniSONE (STERAPRED UNI-PAK 21 TAB) 10 MG (21) TBPK tablet Take by mouth daily. Take 6 tabs by mouth daily  for 2 days, then 5 tabs for 2 days, then 4 tabs for 2 days, then 3 tabs for 2 days, 2 tabs for 2 days, then 1 tab by mouth daily for 2 days 11/09/22   Jeanelle Malling, PA  promethazine (PHENERGAN) 25 MG tablet Take 1 tablet (25 mg total) by mouth every 6 (six) to 8 (eight) hours for nausea or vomiting. 11/22/22   Cirigliano, Vito V, DO  sitaGLIPtin (JANUVIA) 50 MG tablet Take 1 tablet (50 mg total) by mouth daily. 11/02/22   Marcine Matar, MD  spironolactone (ALDACTONE) 25 MG tablet Take 1 tablet (25 mg total) by mouth daily. 11/30/22   Bensimhon, Bevelyn Buckles, MD  zolpidem (AMBIEN) 10 MG tablet Take 5-10 mg by mouth at bedtime.    [provider]     Vitals:   12/08/22 0900 12/08/22 1000 12/08/22 1100 12/08/22 1124  BP:    98/66  Pulse:    65  Resp: 20 16 20 18   Temp:      TempSrc:      SpO2:    98%  Weight:      Height:       Exam Gen alert, no distress No rash, cyanosis or gangrene Sclera anicteric, throat clear  No jvd or bruits Chest clear bilat to bases, no rales/ wheezing RRR no MRG Abd soft ntnd no mass or ascites +bs GU defer MS no joint effusions or deformity Ext no LE or UE edema, no wounds or ulcers Neuro is alert, Ox 3 , nf     Home meds include - tylenol, prazosin 2- 6 hs, xanax, maalox, apixaban, bupropion, flexeril, dapagliflozin, lomotil, benadryl, ensure enlive, veozah, lasix 80 bid, ivabradine 5 bid, creon, metolazone 2.5 every day prn, toprol xl 25 every day, MVI, KCl 160 mEq (=60 ml) bid, phenergan prn, sitagliptin, spironolactone 25 every day, ambien prn    Date   Creat  eGFR  K+    2009- 2022  0.68- 1.09   Jan - dec 2023 0.81 -1.20  Jan - mar 2024 0.86- 1.18   April - jun 2024 0.86- 1.28 October 2022  0.98- 1.19    11/15/22  1.05  63     8/23   1.31  48 ml/min   12/08/22  1.02  > 60 ml/min    ECHO 07/2022 = LVEF 35-40%      UA - pending    UNa, UCr pending      8/23 KCL IV 10mg  x 4 yesterday    8/24 KCL IV 10mg  x 2 today    Aldactone 25 every day as at home    Getting po klor-con packet here 80 mEq every 6 hrs    Got Klor-con 160 meq x 1 po yesterday evening    Total po KCL - 160 + 80 + 80 = 320 meq total po     Total IV = 40 + 20 = 60 meq total IV    AKI here 1.31 >> 1.02 w/ holding po lasix and metolazone    Continues on aldactone     Echo 01/2021 = EF 20-25%      Echo  jan 2023 = EF < 20%      Echo April 2024= EF 35- 40%   Assessment/ Plan: Hypokalemia - due most likely to high dose loop diuretics (lasix) +/- thiazides (takes prn). She had a similar episode in July 2024 w/ lowest K+ 2.8, and in April w/ Na+ 2.9.  Here lowest is 2.3.  EF by echo has improved since 2022 / 2023 (20-25% and < 20%) as noted in echo done April 2024 w/ EF 35-40%. Today she has no edema and lungs are clear. She was likely volume depleted. Would consider the possibility that with her improving EF% over the last year that she may need her diuretic dosing reduced. Would ask CHF team about possibly lowering her lasix/ metolazone dosing. In the meantime agree w/ your holding the po lasix and replacing po and IV KCl w/ goal serum K+ > 3.5- 3.8. Mg was 2.1 which is wnl. No other suggestions at this time. Will sign off. Please call w/ any questions.  AKI - mild AKI w/ peak creat 1.3 here, resolved w/ holding lasix. No vol overload on exam. UA and urine lytes pending.  H/o LV thrombus - takes eliquis H/o anxiety/ depression - per pmd H/o CAD - on lipitor      Rob Shaneice Barsanti  MD [facility] 12/08/2022, 2:28 PM  Recent Labs  Lab 12/07/22 1534 12/07/22 2012 12/08/22 0504  HGB  --   --  14.6  ALBUMIN 4.1  --  3.5  CALCIUM 9.4 10.4* 8.6*  CREATININE 1.13* 1.31* 1.02*  K 2.7* 2.3* 2.8*   Inpatient medications:  ALPRAZolam  2 mg Oral TID   apixaban  5 mg Oral BID   atorvastatin  80 mg Oral Daily   buPROPion  300 mg Oral Daily    cyclobenzaprine  20 mg Oral QHS   feeding supplement  237 mL Oral BID BM   insulin aspart  0-15 Units Subcutaneous TID WC   insulin aspart  0-5 Units Subcutaneous QHS   ivabradine  5 mg Oral BID WC   linagliptin  5 mg Oral Daily   lipase/protease/amylase  36,000 Units Oral TID WC   metoprolol succinate  25 mg Oral Daily   multivitamin with minerals  1 tablet Oral Daily   potassium chloride  80 mEq Oral Q6H   sodium chloride flush  3 mL  Intravenous Q12H   spironolactone  25 mg Oral Daily    potassium chloride 10 mEq (12/08/22 1326)   acetaminophen **OR** acetaminophen, albuterol, bisacodyl, hydrALAZINE, HYDROcodone-acetaminophen, ipratropium, metolazone, ondansetron **OR** ondansetron (ZOFRAN) IV, senna-docusate, zolpidem

## 2022-12-08 NOTE — Progress Notes (Signed)
Pt has just arrived from [facility] and needs orders. RN messaged [facility] admitting.   Devonne Doughty Maycel Riffe

## 2022-12-08 NOTE — Progress Notes (Signed)
Patient admitted after midnight, please see H&P.  This is a 57 y.o. female with a history of depression, DM, anxiety, CAD, HFrEF with PPM/AICD, Crohn's disease, and LV thrombus on Eliquis  now being admitted with:   Severe hypokalemia with EKG changes - Admit obs, tele - Replace KCl -continue oral dose and supplement intravenously -Mg ok -check BMP this AM - ? RTA vs diarrhea vs lasix side effect -renal to see   AKI -lasix held -resolved   History of CAD - Continue Lipitor   History of HFrEF - Continue  metoprolol, spironolactone, ivabradine -BP limiting meds   History of depression, anxiety - Continue Xanax, bupropion, ambien    History of LV thrombus - Continue Eliquis   [employer] DO

## 2022-12-08 NOTE — Plan of Care (Signed)

## 2022-12-09 ENCOUNTER — Other Ambulatory Visit: Payer: Self-pay | Admitting: Physician Assistant

## 2022-12-09 DIAGNOSIS — I5042 Chronic combined systolic (congestive) and diastolic (congestive) heart failure: Secondary | ICD-10-CM

## 2022-12-09 DIAGNOSIS — E876 Hypokalemia: Secondary | ICD-10-CM | POA: Diagnosis not present

## 2022-12-09 LAB — BASIC METABOLIC PANEL
Anion gap: 11 (ref 5–15)
BUN: 18 mg/dL (ref 6–20)
CO2: 22 mmol/L (ref 22–32)
Calcium: 9.2 mg/dL (ref 8.9–10.3)
Chloride: 103 mmol/L (ref 98–111)
Creatinine, Ser: 0.94 mg/dL (ref 0.44–1.00)
GFR, Estimated: >60 mL/min (ref >60)
Glucose, Bld: 137 mg/dL — ABNORMAL HIGH (ref 70–99)
Potassium: 4.8 mmol/L (ref 3.5–5.1)
Sodium: 136 mmol/L (ref 135–145)

## 2022-12-09 LAB — CBC
HCT: 42.8 % (ref 36.0–46.0)
Hemoglobin: 14 g/dL (ref 12.0–15.0)
MCH: 29.2 pg (ref 26.0–34.0)
MCHC: 32.7 g/dL (ref 30.0–36.0)
MCV: 89.2 fL (ref 80.0–100.0)
Platelets: 262 10*3/uL (ref 150–400)
RBC: 4.8 MIL/uL (ref 3.87–5.11)
RDW: 13 % (ref 11.5–15.5)
WBC: 6.2 10*3/uL (ref 4.0–10.5)
nRBC: 0 % (ref 0.0–0.2)

## 2022-12-09 LAB — GLUCOSE, CAPILLARY
Glucose-Capillary: 125 mg/dL — ABNORMAL HIGH (ref 70–99)
Glucose-Capillary: 117 mg/dL — ABNORMAL HIGH (ref 70–99)

## 2022-12-09 MED ORDER — DIGOXIN 125 MCG PO TABS
0.1250 mg | ORAL_TABLET | Freq: Every day | ORAL | Status: DC
  Administered 2022-12-09: 0.125 mg via ORAL
  Filled 2022-12-09: qty 1

## 2022-12-09 MED ORDER — METOPROLOL SUCCINATE ER 25 MG PO TB24
12.5000 mg | ORAL_TABLET | Freq: Every day | ORAL | 0 refills | Status: DC
  Filled 2022-12-09: qty 90, 180d supply, fill #0
  Filled 2023-01-14: qty 45, 90d supply, fill #0
  Filled 2023-04-02: qty 45, 90d supply, fill #1

## 2022-12-09 MED ORDER — POTASSIUM CHLORIDE 20 MEQ PO PACK
40.0000 meq | PACK | Freq: Three times a day (TID) | ORAL | 1 refills | Status: DC
  Filled 2022-12-09: qty 210, 35d supply, fill #0

## 2022-12-09 MED ORDER — METOPROLOL SUCCINATE ER 25 MG PO TB24
12.5000 mg | ORAL_TABLET | Freq: Every day | ORAL | Status: DC
  Administered 2022-12-09: 12.5 mg via ORAL
  Filled 2022-12-09: qty 1

## 2022-12-09 NOTE — Progress Notes (Signed)
DISCHARGE NOTE HOME Olivine Kadish to be discharged Home per MD order. Discussed prescriptions and follow up appointments with the patient. Prescriptions given to patient; medication list explained in detail. Patient verbalized understanding.  Skin clean, dry and intact without evidence of skin break down, no evidence of skin tears noted. IV catheter discontinued intact. Site without signs and symptoms of complications. Dressing and pressure applied. Pt denies pain at the site currently. No complaints noted.  Patient free of lines, drains, and wounds.   An After Visit Summary (AVS) was printed and given to the patient. Patient escorted via wheelchair, and discharged home via private auto.  Velia Meyer, RN

## 2022-12-09 NOTE — Discharge Summary (Addendum)
Physician Discharge Summary  Hareem Fatheree ION:[id number] DOB: May 12, 1965 DOA: 12/07/2022  PCP: Marcine Matar, MD  Admit date: 12/07/2022 Discharge date: 12/09/2022  Admitted From: home Discharge disposition: home   Recommendations for Outpatient Follow-Up:   Tuesday will need BNP and BMP; if K is ok, will start back either lasix 40 mg PO BID (near normal BNP) or 80 mg PO BID (elevated BNP) - needs close follow up with AHF team; Given her Crohn's, electrolytes, HF would be a reasonable candidate for either Optivol monitoring of Cardiomems WILL NEED TO INCREASE BACK UP K WHEN LASIX RESTARTED  Discharge Diagnosis:   Principal Problem:   Hypokalemia    Discharge Condition: Improved.  Diet recommendation: Low sodium, heart healthy.  Carbohydrate-modified.   Wound care: None.  Code status: Full.   History of Present Illness:   Christine [name] is a 57 y.o. 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  Patient was in a usual state of health until 1 night prior to arrival in the emergency department when she developed cramping in her legs her feet her hips associated with a headache.  Similar symptoms have occurred in the past when she had low potassium.  She is currently taking 160 mEq of potassium twice daily.  She was scheduled to see a nephrologist but has not had the appointment yet.   Patient denies fevers/chills, weakness, dizziness, chest pain, shortness of breath, N/V/C/D, abdominal pain, dysuria/frequency, changes in mental status.    Otherwise there has been no change in status. Patient has been taking medication as prescribed and there has been no recent change in medication or diet.  No recent antibiotics.  There has been no recent illness, hospitalizations, travel or sick contacts.     Hospital  Course by Problem:   Severe hypokalemia with EKG changes - Replace KCl -continue oral dose but lower while lasix held -Mg ok -appreciate renal/cards   AKI -lasix held -resolved    History of CAD - Continue Lipitor   History of HFrEF - Continue  metoprolol (lower dose), spironolactone, ivabradine -BP limiting med adjustment -hold lasix as above   History of depression, anxiety - Continue Xanax, bupropion, ambien    History of LV thrombus - Continue Eliquis    Medical Consultants:   Renal cards   Discharge Exam:   Vitals:   12/09/22 0808 12/09/22 0819  BP: (!) 93/53 (!) 92/53  Pulse: 74 74  Resp:  16  Temp:  97.7 F (36.5 C)  SpO2:  99%   Vitals:   12/08/22 2043 12/09/22 0808 12/09/22 0819 12/09/22 1014  BP: (!) 82/55 (!) 93/53 (!) 92/53   Pulse: 67 74 74   Resp: 15  16   Temp: 98.3 F (36.8 C)  97.7 F (36.5 C)   TempSrc: Oral     SpO2: 99%  99%   Weight:    83.6 kg  Height:        General exam: Appears calm and comfortable.    The results of significant diagnostics from this hospitalization (including imaging, microbiology, ancillary and laboratory) are listed below for reference.     Procedures and Diagnostic Studies:   No results found.   Labs:   Basic Metabolic Panel: Recent Labs  Lab 12/07/22 1534 12/07/22 2012 12/08/22 0504 12/08/22 1837 12/09/22 0634  NA 129*  133* 132* 133* 136  K 2.7* 2.3* 2.8* 4.3 4.8  CL 84* 83* 89* 95* 103  CO2 32 38* 30 31 22   GLUCOSE 148* 128* 126* 196* 137*  BUN 25* 32* 22* 22* 18  CREATININE 1.13* 1.31* 1.02* 1.18* 0.94  CALCIUM 9.4 10.4* 8.6* 9.3 9.2  MG  --  2.1  --   --   --    GFR Estimated Creatinine Clearance: 79.5 mL/min (by C-G formula based on SCr of 0.94 mg/dL). Liver Function Tests: Recent Labs  Lab 12/07/22 1534 12/08/22 0504  AST 23 19  ALT 31 27  ALKPHOS 115 93  BILITOT 0.9 0.7  PROT 7.6 6.5  ALBUMIN 4.1 3.5   No results for input(s): "LIPASE", "AMYLASE" in the last 168  hours. No results for input(s): "AMMONIA" in the last 168 hours. Coagulation profile No results for input(s): "INR", "PROTIME" in the last 168 hours.  CBC: Recent Labs  Lab 12/08/22 0504 12/09/22 0634  WBC 9.0 6.2  HGB 14.6 14.0  HCT 42.5 42.8  MCV 85.2 89.2  PLT 366 262   Cardiac Enzymes: No results for input(s): "CKTOTAL", "CKMB", "CKMBINDEX", "TROPONINI" in the last 168 hours. BNP: Invalid input(s): "POCBNP" CBG: Recent Labs  Lab 12/08/22 1102 12/08/22 1615 12/08/22 2039 12/09/22 0713 12/09/22 1118  GLUCAP 122* 114* 138* 125* 117*   D-Dimer No results for input(s): "DDIMER" in the last 72 hours. Hgb A1c Recent Labs    12/08/22 0504  HGBA1C 7.1*   Lipid Profile No results for input(s): "CHOL", "HDL", "LDLCALC", "TRIG", "CHOLHDL", "LDLDIRECT" in the last 72 hours. Thyroid function studies No results for input(s): "TSH", "T4TOTAL", "T3FREE", "THYROIDAB" in the last 72 hours.  Invalid input(s): "FREET3" Anemia work up No results for input(s): "VITAMINB12", "FOLATE", "FERRITIN", "TIBC", "IRON", "RETICCTPCT" in the last 72 hours. Microbiology Recent Results (from the past 240 hour(s))  Calprotectin, Fecal     Status: None   Collection Time: 11/30/22  4:46 PM   Specimen: Stool   Stool  Result Value Ref Range Status   Calprotectin, Fecal 45 0 - 120 ug/g Final    Comment: Concentration     Interpretation   Follow-Up < 5 - 50 ug/g     Normal           None >50 -120 ug/g     Borderline       Re-evaluate in 4-6 weeks     >120 ug/g     Abnormal         Repeat as clinically                                    indicated      Discharge Instructions:   Discharge Instructions     Diet - low sodium heart healthy   Complete by: As directed    Diet Carb Modified   Complete by: As directed    Discharge instructions   Complete by: As directed    Tuesday will get BNP and BMP; if K is ok, will start back either lasix 40 mg PO BID (near normal BNP) or 80 mg PO BID  (elevated BNP) - needs close follow up with AHF team; Given her Crohn's, electrolytes, HF would be a reasonable candidate for either Optivol monitoring of Cardiomems   Increase activity slowly   Complete by: As directed       Allergies as of 12/09/2022  Reactions   Gabapentin Diarrhea   Sulfa Antibiotics Hives, Itching, Rash   Erythromycin Hives   Tramadol Rash, Other (See Comments)   Urinary retention   Ibuprofen Itching   Tape Rash        Medication List     STOP taking these medications    digoxin 0.125 MG tablet Commonly known as: LANOXIN   furosemide 40 MG tablet Commonly known as: LASIX   metolazone 2.5 MG tablet Commonly known as: ZAROXOLYN   Potassium Chloride 40 MEQ/15ML (20%) Soln Replaced by: potassium chloride 20 MEQ packet       TAKE these medications    Accu-Chek Guide test strip Generic drug: glucose blood Use as instructed to check blood sugar daily before breakfast   Accu-Chek Guide w/Device Kit Check blood sugar once daily before breakfast   Accu-Chek Softclix Lancets lancets Use as instructed.  Check blood sugar daily before breakfast.   acetaminophen 500 MG tablet Commonly known as: TYLENOL Take 1,000 mg by mouth 3 (three) times daily as needed for moderate pain or headache.   ALPRAZolam 1 MG tablet Commonly known as: XANAX Take 2 mg by mouth 3 (three) times daily.   Antacid Regular Strength 200-200-20 MG/5ML suspension Generic drug: alum & mag hydroxide-simeth Take 30 mLs by mouth every 4 (four) hours as needed for indigestion or heartburn.   atorvastatin 80 MG tablet Commonly known as: LIPITOR Take 1 tablet (80 mg total) by mouth daily.   buPROPion 150 MG 24 hr tablet Commonly known as: WELLBUTRIN XL Take 150 mg by mouth daily.   Corlanor 5 MG Tabs tablet Generic drug: ivabradine Take 1 tablet (5 mg total) by mouth 2 (two) times daily.   Creon 36000-114000 units Cpep capsule Generic drug:  lipase/protease/amylase Take 2 capsules ( 72000 units) with meals and 1 capsule ( 36000 units) with snack. What changed:  how much to take when to take this additional instructions   cyclobenzaprine 10 MG tablet Commonly known as: FLEXERIL Take 20 mg by mouth at bedtime.   diphenhydrAMINE 25 MG tablet Commonly known as: BENADRYL Take 50 mg by mouth as needed for allergies.   diphenoxylate-atropine 2.5-0.025 MG tablet Commonly known as: LOMOTIL Take 2 tablets by mouth 4 (four) times daily as needed for diarrhea/loose stools. What changed:  reasons to take this Another medication with the same name was removed. Continue taking this medication, and follow the directions you see here.   Eliquis 5 MG Tabs tablet Generic drug: apixaban Take 1 tablet (5 mg total) by mouth 2 (two) times daily.   Farxiga 10 MG Tabs tablet Generic drug: dapagliflozin propanediol Take 1 tablet (10 mg total) by mouth daily.   feeding supplement Liqd Take 237 mLs by mouth 2 (two) times daily between meals.   Januvia 50 MG tablet Generic drug: sitaGLIPtin Take 1 tablet (50 mg total) by mouth daily.   metoprolol succinate 25 MG 24 hr tablet Commonly known as: TOPROL-XL Take 0.5 tablets (12.5 mg total) by mouth daily. Start taking on: December 10, 2022 What changed:  how much to take additional instructions   metroNIDAZOLE 1 % gel Commonly known as: METROGEL Apply 1 Application topically as needed (rash).   multivitamin with minerals tablet Take 1 tablet by mouth daily.   oxymetazoline 0.05 % nasal spray Commonly known as: AFRIN Place 1-2 sprays into both nostrils 2 (two) times daily as needed for congestion.   potassium chloride 20 MEQ packet Commonly known as: KLOR-CON Take 40 mEq by mouth  3 (three) times daily. Replaces: Potassium Chloride 40 MEQ/15ML (20%) Soln   prazosin 2 MG capsule Commonly known as: MINIPRESS Take 2-6 mg by mouth at bedtime.   promethazine 25 MG tablet Commonly  known as: PHENERGAN Take 1 tablet (25 mg total) by mouth every 6 (six) to 8 (eight) hours for nausea or vomiting.   spironolactone 25 MG tablet Commonly known as: ALDACTONE Take 1 tablet (25 mg total) by mouth daily.   Veozah 45 MG Tabs Generic drug: Fezolinetant Take 1 tablet (45 mg total) by mouth daily.   zolpidem 10 MG tablet Commonly known as: AMBIEN Take 5-10 mg by mouth at bedtime.        Follow-up Information     Marcine Matar, MD Follow up.   Specialty: Internal Medicine Contact information: [address] [place] [address] [phone]         Bensimhon, Bevelyn Buckles, MD Follow up.   Specialty: Cardiology Why: for labs tuesday, they will call you for a f/u appt Contact information: [address] [place] [address] [phone]                  Time coordinating discharge: 45 min  Signed:  Joseph Art DO  [facility] 12/09/2022, 1:41 PM

## 2022-12-09 NOTE — Discharge Instructions (Addendum)
WEIGH DAILY, every am, wearing the same amount of clothing Record weights, and bring it to office appointments Contact Arvilla Meres, MD  for weight gain of 3 lbs in a day or 5 lbs in a week Limit sodium to 500 mg per meal, total 2000 mg per day Limit all liquids to 1.5-2 liters/quarts per day   HOLD LASIX FOR NOW
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
## 2022-12-10 ENCOUNTER — Other Ambulatory Visit (HOSPITAL_COMMUNITY): Payer: Self-pay | Admitting: Cardiology

## 2022-12-10 ENCOUNTER — Other Ambulatory Visit (HOSPITAL_COMMUNITY): Payer: Self-pay

## 2022-12-10 ENCOUNTER — Telehealth: Payer: Self-pay

## 2022-12-10 ENCOUNTER — Other Ambulatory Visit: Payer: Self-pay

## 2022-12-10 DIAGNOSIS — I10 Essential (primary) hypertension: Secondary | ICD-10-CM

## 2022-12-10 DIAGNOSIS — I5042 Chronic combined systolic (congestive) and diastolic (congestive) heart failure: Secondary | ICD-10-CM

## 2022-12-10 NOTE — Patient Outreach (Signed)
Goals Addressed             This Visit's Progress    Transition of Care

## 2022-12-10 NOTE — Telephone Encounter (Signed)
Attempted care-plan - not visible Will continue Care Plan documentation on prn follow-up tele-visit scheduled for this Wednesday, 8/27

## 2022-12-11 ENCOUNTER — Ambulatory Visit (HOSPITAL_COMMUNITY)
Admission: RE | Admit: 2022-12-11 | Discharge: 2022-12-11 | Disposition: A | Discharge status: Home / Self Care | Payer: Medicaid Other | Source: Ambulatory Visit | Attending: Internal Medicine | Admitting: Internal Medicine

## 2022-12-11 ENCOUNTER — Other Ambulatory Visit: Payer: Self-pay

## 2022-12-11 ENCOUNTER — Other Ambulatory Visit (HOSPITAL_COMMUNITY): Payer: Self-pay

## 2022-12-11 DIAGNOSIS — I5042 Chronic combined systolic (congestive) and diastolic (congestive) heart failure: Secondary | ICD-10-CM | POA: Diagnosis not present

## 2022-12-11 LAB — BASIC METABOLIC PANEL
Anion gap: 8 (ref 5–15)
BUN: 12 mg/dL (ref 6–20)
CO2: 23 mmol/L (ref 22–32)
Calcium: 9.4 mg/dL (ref 8.9–10.3)
Chloride: 108 mmol/L (ref 98–111)
Creatinine, Ser: 0.81 mg/dL (ref 0.44–1.00)
GFR, Estimated: >60 mL/min (ref >60)
Glucose, Bld: 125 mg/dL — ABNORMAL HIGH (ref 70–99)
Potassium: 4.6 mmol/L (ref 3.5–5.1)
Sodium: 139 mmol/L (ref 135–145)

## 2022-12-11 LAB — BRAIN NATRIURETIC PEPTIDE: B Natriuretic Peptide: 118.5 pg/mL — ABNORMAL HIGH (ref 0.0–100.0)

## 2022-12-12 ENCOUNTER — Telehealth: Payer: Self-pay

## 2022-12-12 ENCOUNTER — Other Ambulatory Visit: Payer: Medicaid Other

## 2022-12-12 ENCOUNTER — Telehealth (HOSPITAL_COMMUNITY): Payer: Self-pay

## 2022-12-12 ENCOUNTER — Other Ambulatory Visit (HOSPITAL_COMMUNITY): Payer: Self-pay

## 2022-12-12 ENCOUNTER — Telehealth (HOSPITAL_COMMUNITY): Payer: Self-pay | Admitting: Cardiology

## 2022-12-12 DIAGNOSIS — I5042 Chronic combined systolic (congestive) and diastolic (congestive) heart failure: Secondary | ICD-10-CM

## 2022-12-12 MED ORDER — POTASSIUM CHLORIDE CRYS ER 20 MEQ PO TBCR
40.0000 meq | EXTENDED_RELEASE_TABLET | Freq: Two times a day (BID) | ORAL | 6 refills | Status: DC
  Filled 2022-12-12: qty 120, 30d supply, fill #0

## 2022-12-12 MED ORDER — FUROSEMIDE 20 MG PO TABS
40.0000 mg | ORAL_TABLET | Freq: Two times a day (BID) | ORAL | 3 refills | Status: DC
  Filled 2022-12-12: qty 120, 30d supply, fill #0
  Filled 2023-01-14: qty 120, 30d supply, fill #1
  Filled 2023-02-09: qty 120, 30d supply, fill #2
  Filled 2023-03-05: qty 120, 30d supply, fill #3

## 2022-12-12 NOTE — Telephone Encounter (Signed)
[facility] Patient Advocate Encounter  Prior authorization for Marcelline Deist has been submitted and approved. Test billing returns $4 for 90 day supply.  Key: [id number] Effective: 11/28/2022 to 12/12/2023  Burnell Blanks, CPhT Rx Patient Advocate Phone: [phone]

## 2022-12-12 NOTE — Patient Outreach (Signed)
  Care Management  Transitions of Care Program Brief tele-visit due to patient's need to have MD office follow up on urgent medical issue, Full appt rescheduled for 8/28 @ 11am   12/12/2022 Name: Christine [name] MRN: [id number] DOB: 03-19-1966  Subjective: Christine [name] is a 57 y.o. year old female who is a primary care patient of Marcine Matar, MD. The Care Management team Engaged with patient Engaged with patient by telephone to assess and address transitions of care needs.   Consent to Services:  Patient was given information about care management services, agreed to services, and gave verbal consent to participate.   Assessment:  Complete assessment deferred to full Transition of Care appt resheduled for 8/28 @ 11am    Care Plan: Development of patient's personalized   Care Plan to be completed tomorrow, 8/29     Plan: Telephone follow up appointment with care management team member scheduled VPX:[id number] 29 @ 11am  Dear Corrie Dandy,   I'm pleased you were able to obtain the follow up needed by your MD regarding instructions on when you could resume your Lasix and at what dose. Looking forward to speaking with you tomorrow, 8/29, and hearing how much fluid weight you lost overnight.  Warm Regards,  Franki Monte, RN, BA, [facility], [facility] Health Care Management Coordinator Ph # [phone]

## 2022-12-12 NOTE — Telephone Encounter (Signed)
Pt aware of changes  Labs 9/4 Remote 9/3 Fu provider 9/9  Marcelline Deist PA to pharmacy team to assist

## 2022-12-12 NOTE — Telephone Encounter (Signed)
Pt seen in ER over the weekend for hypokalemia.   Told to stop digoxin, lasix, metolazone, potassium  Above meds have been held since 8/25 Weight has increased x 13 lbs and c/o increased SOB   Follow up labs done 8/27 And fu with provider 9/9  -pt does not wish to wait that long to have meds adjusted please advise

## 2022-12-13 ENCOUNTER — Encounter (HOSPITAL_COMMUNITY): Payer: Self-pay | Admitting: Internal Medicine

## 2022-12-13 ENCOUNTER — Telehealth: Payer: Self-pay

## 2022-12-13 ENCOUNTER — Other Ambulatory Visit: Payer: Medicaid Other

## 2022-12-13 DIAGNOSIS — L531 Erythema annulare centrifugum: Secondary | ICD-10-CM | POA: Diagnosis not present

## 2022-12-13 NOTE — Patient Outreach (Signed)
  Care Management  Transitions of Care Program Managed Medicaid Transitions of Care week 2   12/13/2022 Name: Christine [name] MRN: [id number] DOB: January 24, 1966  Subjective: Christine [name] is a 57 y.o. year old female who is a primary care patient of Marcine Matar, MD. The Care Management team engaged with patient Engaged with patient by telephone to assess and address transitions of care needs.   Consent to Services:  Patient was given information about care management services, agreed to services, and gave verbal consent to participate.   Assessment:      Goals Addressed             This Visit's Progress    Transition of Care          SDOH Interventions    Flowsheet Row Telephone from 12/13/2022 in [facility] Telephone from 10/01/2022 in [facility] Office Visit from 03/20/2022 in [facility] from 01/31/2021 in [facility]  SDOH Interventions      Food Insecurity Interventions -- Assist with SNAP Application -- Intervention Not Indicated  Housing Interventions -- -- -- Intervention Not Indicated  Transportation Interventions -- -- -- Intervention Not Indicated  Depression Interventions/Treatment  Medication, Currently on Treatment -- Currently on Treatment --  Financial Strain Interventions -- -- -- Artist       Plan: Telephone follow up appointment with care management team member scheduled for: Sept 4, 2024 at 10:30am   It was a pleasure working with you today, [name], retail. Glad to hear some of that extra fluid (3.5 pounds) came off overnight. Please continue doing what we discussed to better manage your Congestive Heart Failure at home but do not hesitate to report  worsening signs and symptoms warranting MD involvement sooner than later.  Warm regards,  Franki Monte, RN,  BA, [facility], CRRN [facility] Population Health Care Management Coordinator Ph # Main office toll free number

## 2022-12-13 NOTE — Transitions of Care (Post Inpatient/ED Visit) (Signed)
12/13/2022  Name: Christine [name] MRN: [id number] DOB: [birth year]  Today's TOC FU Call Status: Today's TOC FU Call Status:: Successful TOC FU Call Completed TOC FU Call Complete Date: 12/10/22 Patient's Name and Date of Birth confirmed.  Transition Care Management Follow-up Telephone Call Date of Discharge: 12/09/22 Discharge Facility: [facility]) Type of Discharge: Inpatient Admission Primary Inpatient Discharge Diagnosis:: hypokalemia How have you been since you were released from the hospital?: Same Any questions or concerns?: Yes Patient Questions/Concerns:: Concerned about when she will be restarting her Lasix as her weight continues to increase daily and Lasix was stopped prior to dc from hospital able to answer pt's concerns - she has a lab appointment with Dr. Prescott Gum office tomorrow, 8/27 and depending on her BNP and BMP results, she will most likely start back on Lasix either 40mg  BID or 80mg  BID per discharging MD's DC summary on 8/25 Patient Questions/Concerns Addressed: Other: (see above comments under Patient Questions/Concerns)  Items Reviewed: Did you receive and understand the discharge instructions provided?: Yes Medications obtained,verified, and reconciled?: Yes (Medications Reviewed) Any new allergies since your discharge?: No Dietary orders reviewed?: Yes Type of Diet Ordered:: Low sodium, heart healthy, carb modified Do you have support at home?: Yes  Medications Reviewed Today: Medications Reviewed Today   Medications were not reviewed in this encounter   Unable to complete TOC assessment due to urgent patient care needs. See additional notes.   Home Care and Equipment/Supplies: Were Home Health Services Ordered?: No Any new equipment or medical supplies ordered?: No  Functional Questionnaire: Do you need assistance with bathing/showering or dressing?: No Do you need assistance with meal preparation?: No Do you need assistance with eating?:  No Do you have difficulty maintaining continence: No Do you need assistance with getting out of bed/getting out of a chair/moving?: No Do you have difficulty managing or taking your medications?: No  Follow up appointments reviewed: PCP Follow-up appointment confirmed?: No (Pt states she plans to call her PCP, Jonah Blue for f/u appt) MD Provider Line [phone] Given: No Specialist Hospital Follow-up appointment confirmed?: Yes Date of Specialist follow-up appointment?: 12/11/22 Follow-Up Specialty Provider:: Dr. Gala Romney Do you need transportation to your follow-up appointment?: No Do you understand care options if your condition(s) worsen?: Yes-patient verbalized understanding    Goals Addressed             This Visit's Progress    Transition of Care       Current Barriers:  Chronic Disease Management support and education needs related to CHF  RNCM Clinical Goal(s):  Patient will verbalize understanding of plan for management of CHF as evidenced by patient explaining her daily steps to managing her CHF, such as weighing self daily, taking meds as prescribed, reporting worsening symptoms of fluid retention, difficulty breathing immediately to her MD take all medications exactly as prescribed and will call provider for medication related questions as evidenced by patient voicing her active participation in her well-being and expressing understanding of what signs and symptoms of worsening Congestive Heart Failure need to be immediately reported to her MD    demonstrate understanding of rationale for each prescribed medication as evidenced by describing the actions of her various meds such as diuretics, and cardiac meds to manage her CHF. As of 8/28 is keeping log of daily weights and signs & symptoms of increasing fluid retention.    attend all scheduled medical appointments: will report adherence as evidenced by appt completion documentation in EMR. Patient has  scheduled lab draw 9/4 am, and scheduled appt with Dr. Prescott Gum PA on 9/9        continue to work with RN Care Manager and/or Social Worker to address care management and care coordination needs related to CHF as evidenced by adherence to CM Team Scheduled appointments     through collaboration with RN Care manager, Alyse Low, PCP Jonah Blue and care team, and Cardiology team of Dr. Prescott Gum.  Interventions: Evaluation of current treatment plan related to self management and patient's adherence to plan as established by provider     Patient Goals/Self-Care Activities: Take medications as prescribed   Attend all scheduled provider appointments Call pharmacy for medication refills 3-7 days in advance of running out of medications Call provider office for new concerns or questions  call office if I gain more than 2 pounds in one day or 5 pounds in one week keep legs up while sitting track weight in diary use salt in moderation watch for swelling in feet, ankles and legs every day weigh myself daily begin a heart failure diary bring diary to all appointments develop a rescue plan follow rescue plan if symptoms flare-up know when to call the doctor: worsening signs and symptoms of fluid retention, difficulty breathing warrant immediate call to MD track symptoms and what helps feel better or worse          Alyse Low, RN, BA, [facility], CRRN [facility] Health Care Management Coordinator Ph # [phone]

## 2022-12-13 NOTE — Patient Instructions (Signed)
Visit Information  Thank you for taking time to visit with me today. Please don't hesitate to contact me if I can be of assistance to you before our next scheduled telephone appointment.  Following is a copy of your care plan:   Goals Addressed             This Visit's Progress    Transition of Care          Patient verbalizes understanding of instructions and care plan provided today and agrees to view in MyChart. Active MyChart status and patient understanding of how to access instructions and care plan via MyChart confirmed with patient.      Telephone follow up appointment with Transition of Care management team member Alyse Low RN scheduled for December 19, 2022 at 10:30am  Please call the care guide team at [phone] if you need to cancel or reschedule your appointment.   Please call 1-800-273-TALK (toll free, 24 hour hotline) if you are experiencing a Mental Health or Behavioral Health Crisis or need someone to talk to.  Alyse Low, RN, BA, [facility], CRRN [facility] Care Management Coordinator Ph # [phone]

## 2022-12-14 ENCOUNTER — Telehealth (HOSPITAL_COMMUNITY): Payer: Self-pay | Admitting: Licensed Clinical Social Worker

## 2022-12-14 ENCOUNTER — Other Ambulatory Visit (HOSPITAL_COMMUNITY): Payer: Self-pay

## 2022-12-14 NOTE — Telephone Encounter (Signed)
CSW requested to contact patient to provide support and follow up regarding triage line. Patient reports she was unsure who or when to call HF Clinic and feeling like she did not want to be a burden. CSW assured patient that she is our primary focus and reviewed the process of contacting the clinic via triage line with any questions regarding her care. Patient verbalizes understanding of process and will call if needs arise. CSW available as needed. Lasandra Beech, LCSW, CCSW-MCS [phone]

## 2022-12-15 ENCOUNTER — Telehealth: Payer: Self-pay | Admitting: Cardiology

## 2022-12-15 ENCOUNTER — Encounter (HOSPITAL_COMMUNITY): Payer: Self-pay

## 2022-12-15 ENCOUNTER — Ambulatory Visit (HOSPITAL_COMMUNITY)
Admission: EM | Admit: 2022-12-15 | Discharge: 2022-12-15 | Disposition: A | Discharge status: Home / Self Care | Payer: Medicaid Other | Attending: Emergency Medicine | Admitting: Emergency Medicine

## 2022-12-15 DIAGNOSIS — U071 COVID-19: Secondary | ICD-10-CM | POA: Diagnosis not present

## 2022-12-15 DIAGNOSIS — I5040 Unspecified combined systolic (congestive) and diastolic (congestive) heart failure: Secondary | ICD-10-CM | POA: Insufficient documentation

## 2022-12-15 DIAGNOSIS — Z8639 Personal history of other endocrine, nutritional and metabolic disease: Secondary | ICD-10-CM | POA: Diagnosis not present

## 2022-12-15 LAB — CBC WITH DIFFERENTIAL/PLATELET
Abs Immature Granulocytes: 0.04 10*3/uL (ref 0.00–0.07)
Basophils Absolute: 0.1 10*3/uL (ref 0.0–0.1)
Basophils Relative: 1 %
Eosinophils Absolute: 0.2 10*3/uL (ref 0.0–0.5)
Eosinophils Relative: 2 %
HCT: 43.3 % (ref 36.0–46.0)
Hemoglobin: 14.3 g/dL (ref 12.0–15.0)
Immature Granulocytes: 0 %
Lymphocytes Relative: 10 %
Lymphs Abs: 1 10*3/uL (ref 0.7–4.0)
MCH: 29.2 pg (ref 26.0–34.0)
MCHC: 33 g/dL (ref 30.0–36.0)
MCV: 88.5 fL (ref 80.0–100.0)
Monocytes Absolute: 1.1 10*3/uL — ABNORMAL HIGH (ref 0.1–1.0)
Monocytes Relative: 11 %
Neutro Abs: 7.4 10*3/uL (ref 1.7–7.7)
Neutrophils Relative %: 76 %
Platelets: 317 10*3/uL (ref 150–400)
RBC: 4.89 MIL/uL (ref 3.87–5.11)
RDW: 13.5 % (ref 11.5–15.5)
WBC: 9.8 10*3/uL (ref 4.0–10.5)
nRBC: 0 % (ref 0.0–0.2)

## 2022-12-15 LAB — COMPREHENSIVE METABOLIC PANEL
ALT: 27 U/L (ref 0–44)
AST: 23 U/L (ref 15–41)
Albumin: 3.7 g/dL (ref 3.5–5.0)
Alkaline Phosphatase: 82 U/L (ref 38–126)
Anion gap: 7 (ref 5–15)
BUN: 11 mg/dL (ref 6–20)
CO2: 21 mmol/L — ABNORMAL LOW (ref 22–32)
Calcium: 8.3 mg/dL — ABNORMAL LOW (ref 8.9–10.3)
Chloride: 107 mmol/L (ref 98–111)
Creatinine, Ser: 0.92 mg/dL (ref 0.44–1.00)
GFR, Estimated: >60 mL/min (ref >60)
Glucose, Bld: 89 mg/dL (ref 70–99)
Potassium: 3.9 mmol/L (ref 3.5–5.1)
Sodium: 135 mmol/L (ref 135–145)
Total Bilirubin: 0.7 mg/dL (ref 0.3–1.2)
Total Protein: 6.8 g/dL (ref 6.5–8.1)

## 2022-12-15 LAB — POCT FASTING CBG KUC MANUAL ENTRY: POCT Glucose (KUC): 99 mg/dL (ref 70–99)

## 2022-12-15 LAB — MAGNESIUM: Magnesium: 2 mg/dL (ref 1.7–2.4)

## 2022-12-15 MED ORDER — ALBUTEROL SULFATE HFA 108 (90 BASE) MCG/ACT IN AERS
1.0000 | INHALATION_SPRAY | Freq: Four times a day (QID) | RESPIRATORY_TRACT | 0 refills | Status: DC | PRN
Start: 2022-12-15 — End: 2023-01-21

## 2022-12-15 MED ORDER — MOLNUPIRAVIR EUA 200MG CAPSULE: 4.0000 | ORAL_CAPSULE | Freq: Two times a day (BID) | ORAL | 0 refills | Status: AC

## 2022-12-15 MED ORDER — BENZONATATE 200 MG PO CAPS: 200.0000 mg | ORAL_CAPSULE | Freq: Three times a day (TID) | ORAL | 0 refills | Status: AC

## 2022-12-15 NOTE — Discharge Instructions (Addendum)
You are not a candidate for Paxlovid due to your medications.  Start taking the molnupiravir twice daily for the next 5 days.  You can also do the Tessalon Perles to help with your cough.  Ensure you are staying well-hydrated and getting plenty of rest.  You can take 500 mg of Tylenol as needed for any fevers, body aches or chills.  Do not hesitate to seek care at the nearest emergency department if your symptoms worsen or do not improve.

## 2022-12-15 NOTE — ED Provider Notes (Signed)
[facility]    CSN: [id number] Arrival date & time: 12/15/22  1307      History   Chief Complaint Chief Complaint  Patient presents with   Cough    HPI Christine [name] is a 57 y.o. female.   Patient presents to clinic for nonproductive cough, sore throat, nasal congestion, headaches, chills and intermittent shortness of breath that started last night, waking her up in the middle the night.  Her sister did test positive for COVID yesterday.  Patient took a home test today and it also was positive.  She denies nausea, vomiting, abdominal pain or fevers.  No chest pain. No edema.   Reports a lower blood pressure is normal for her.  Her CBG this morning was 130.   She was recently discharged after an admission from the emergency department for hyperglycemia, hypokalemia and congestive heart failure.    The history is provided by the patient and medical records.  Cough Associated symptoms: chills, headaches, shortness of breath and sore throat   Associated symptoms: no chest pain and no wheezing     Past Medical History:  Diagnosis Date   Allergy    Anxiety 01/20/2021   CAD in native artery 08/24/2021   CHF (congestive heart failure) (HCC)    Chronic combined systolic and diastolic heart failure (HCC) 08/24/2021   Complication of anesthesia    Depression    Hyperlipidemia    Myocardial infarction [facility])    Presence of cardiac defibrillator 07/2021   Shortness of breath 01/20/2021   Sleep apnea 07/2021   Tobacco abuse 01/20/2021    Patient Active Problem List   Diagnosis Date Noted   Crohn's disease involving terminal ileum (HCC) 07/23/2022   Crohn's disease of large intestine with other complication (HCC) 05/11/2022   Mild major depression (HCC) 05/11/2022   Paroxysmal tachycardia, unspecified (HCC) 05/11/2022   Essential hypertension 03/02/2022   Acute pancreatitis 03/01/2022   Hypokalemia 03/01/2022   Ileitis 03/01/2022   Diarrhea 02/26/2022    Nausea without vomiting 02/26/2022   Loss of weight 02/26/2022   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   Ulcer of ileum 02/26/2022   Diverticulosis of colon without hemorrhage 02/26/2022   Cardiomyopathy (HCC) ischemic and non-ischemic 01/16/2022   Abdominal aortic aneurysm (AAA) 3.0 cm to 5.0 cm in diameter in female (HCC) 01/09/2022   Polycythemia 01/09/2022   Chronic combined systolic and diastolic heart failure (HCC) 08/24/2021   CAD in native artery 08/24/2021   S/P ICD (internal cardiac defibrillator) procedure 08/10/2021   Obstructive sleep apnea 05/05/2021   Former smoker 02/06/2021   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   Obesity (BMI 30.0-34.9) 02/06/2021   Anxiety 01/20/2021   Tobacco abuse 01/20/2021   Shortness of breath 01/20/2021   Chest tightness     Past Surgical History:  Procedure Laterality Date   ABDOMINAL HYSTERECTOMY     BIOPSY  02/26/2022   Procedure: BIOPSY;  Surgeon: Shellia Cleverly, DO;  Location: [facility];  Service: Gastroenterology;;   CARDIAC CATHETERIZATION     CHOLECYSTECTOMY     COLONOSCOPY WITH PROPOFOL N/A 02/26/2022   Procedure: COLONOSCOPY WITH PROPOFOL;  Surgeon: Shellia Cleverly, DO;  Location: [facility];  Service: Gastroenterology;  Laterality: N/A;   ESOPHAGOGASTRODUODENOSCOPY (EGD) WITH PROPOFOL N/A 02/26/2022   Procedure: ESOPHAGOGASTRODUODENOSCOPY (EGD) WITH PROPOFOL;  Surgeon: Shellia Cleverly, DO;  Location: [facility];  Service: Gastroenterology;  Laterality: N/A;   ICD IMPLANT N/A 07/21/2021   Procedure: ICD IMPLANT;  Surgeon: Duke Salvia,  MD;  Location: [facility];  Service: Cardiovascular;  Laterality: N/A;   NECK SURGERY     OVARIAN CYST SURGERY     POLYPECTOMY  02/26/2022   Procedure: POLYPECTOMY;  Surgeon: Shellia Cleverly, DO;  Location: [facility];  Service: Gastroenterology;;   RIGHT/LEFT HEART CATH AND CORONARY ANGIOGRAPHY N/A 01/23/2021   Procedure: RIGHT/LEFT HEART CATH AND CORONARY  ANGIOGRAPHY;  Surgeon: Dolores Patty, MD;  Location: [facility];  Service: Cardiovascular;  Laterality: N/A;    OB History     Gravida  1   Para      Term      Preterm      AB  1   Living         SAB      IAB      Ectopic      Multiple      Live Births               Home Medications    Prior to Admission medications   Medication Sig Start Date End Date Taking? Authorizing Provider  benzonatate (TESSALON) 200 MG capsule Take 1 capsule (200 mg total) by mouth every 8 (eight) hours for 10 days. 12/15/22 12/25/22 Yes Rinaldo Ratel, [place] N, FNP  molnupiravir EUA (LAGEVRIO) 200 mg CAPS capsule Take 4 capsules (800 mg total) by mouth 2 (two) times daily for 5 days. 12/15/22 12/20/22 Yes Tiffiany Beadles, [place] N, FNP  Accu-Chek Softclix Lancets lancets Use as instructed.  Check blood sugar daily before breakfast. 11/02/22   Marcine Matar, MD  acetaminophen (TYLENOL) 500 MG tablet Take 1,000 mg by mouth 3 (three) times daily as needed for moderate pain or headache.    [provider]  ALPRAZolam Prudy Feeler) 1 MG tablet Take 2 mg by mouth 3 (three) times daily.    [provider]  alum & mag hydroxide-simeth (MAALOX/MYLANTA) 200-200-20 MG/5ML suspension Take 30 mLs by mouth every 4 (four) hours as needed for indigestion or heartburn. 03/02/22   Atway, Rayann N, DO  apixaban (ELIQUIS) 5 MG TABS tablet Take 1 tablet (5 mg total) by mouth 2 (two) times daily. 11/05/22   Bensimhon, Bevelyn Buckles, MD  atorvastatin (LIPITOR) 80 MG tablet Take 1 tablet (80 mg total) by mouth daily. 01/19/22   Laurey Morale, MD  Blood Glucose Monitoring Suppl (ACCU-CHEK GUIDE) w/Device KIT Check blood sugar once daily before breakfast 11/02/22   Marcine Matar, MD  buPROPion (WELLBUTRIN XL) 150 MG 24 hr tablet Take 150 mg by mouth daily.    [provider]  cyclobenzaprine (FLEXERIL) 10 MG tablet Take 20 mg by mouth at bedtime.    [provider]  dapagliflozin  propanediol (FARXIGA) 10 MG TABS tablet Take 1 tablet (10 mg total) by mouth daily. 10/23/22   Bensimhon, Bevelyn Buckles, MD  diphenhydrAMINE (BENADRYL) 25 MG tablet Take 50 mg by mouth as needed for allergies.    [provider]  diphenoxylate-atropine (LOMOTIL) 2.5-0.025 MG tablet Take 2 tablets by mouth 4 (four) times daily as needed for diarrhea/loose stools. Patient taking differently: Take 2 tablets by mouth 4 (four) times daily as needed for diarrhea or loose stools. 11/19/22   Cirigliano, Vito V, DO  feeding supplement (ENSURE ENLIVE / ENSURE PLUS) LIQD Take 237 mLs by mouth 2 (two) times daily between meals. 03/03/22   Atway, Rayann N, DO  Fezolinetant (VEOZAH) 45 MG TABS Take 1 tablet (45 mg total) by mouth daily. 11/08/22  Marcine Matar, MD  furosemide (LASIX) 20 MG tablet Take 2 tablets (40 mg total) by mouth 2 (two) times daily. 12/12/22   Jacklynn Ganong, FNP  glucose blood (ACCU-CHEK GUIDE) test strip Use as instructed to check blood sugar daily before breakfast 11/02/22   Marcine Matar, MD  ivabradine (CORLANOR) 5 MG TABS tablet Take 1 tablet (5 mg total) by mouth 2 (two) times daily. 11/08/22   Marcine Matar, MD  lipase/protease/amylase (CREON) 36000 UNITS CPEP capsule Take 2 capsules ( 72000 units) with meals and 1 capsule ( 36000 units) with snack. Patient taking differently: 36,000 Units with breakfast, with lunch, and with evening meal. Take 1 tablet before breakfast 03/13/22   Cirigliano, Vito V, DO  metoprolol succinate (TOPROL-XL) 25 MG 24 hr tablet Take 1/2 tablet (12.5 mg total) by mouth daily. 12/10/22   Joseph Art, DO  metroNIDAZOLE (METROGEL) 1 % gel Apply 1 Application topically as needed (rash).    [provider]  Multiple Vitamins-Minerals (MULTIVITAMIN WITH MINERALS) tablet Take 1 tablet by mouth daily.    [provider]  oxymetazoline (AFRIN) 0.05 % nasal spray Place 1-2 sprays into both nostrils 2 (two) times daily as needed for  congestion.    [provider]  potassium chloride SA (KLOR-CON M) 20 MEQ tablet Take 2 tablets (40 mEq total) by mouth 2 (two) times daily. 12/12/22   Milford, Anderson Malta, FNP  prazosin (MINIPRESS) 2 MG capsule Take 2-6 mg by mouth at bedtime. 10/24/22   [provider]  promethazine (PHENERGAN) 25 MG tablet Take 1 tablet (25 mg total) by mouth every 6 (six) to 8 (eight) hours for nausea or vomiting. 11/22/22   Cirigliano, Vito V, DO  sitaGLIPtin (JANUVIA) 50 MG tablet Take 1 tablet (50 mg total) by mouth daily. 11/02/22   Marcine Matar, MD  spironolactone (ALDACTONE) 25 MG tablet Take 1 tablet (25 mg total) by mouth daily. 11/30/22   Bensimhon, Bevelyn Buckles, MD  zolpidem (AMBIEN) 10 MG tablet Take 5-10 mg by mouth at bedtime.    [provider]    Family History Family History  Problem Relation Age of Onset   Stroke Mother    Atrial fibrillation Mother    Heart failure Father    Stomach cancer Maternal Grandfather    Liver cancer Neg Hx    Esophageal cancer Neg Hx    Colon polyps Neg Hx    Colon cancer Neg Hx     Social History Social History   Tobacco Use   Smoking status: Former    Current packs/day: 0.00    Types: Cigarettes    Quit date: 01/15/2021    Years since quitting: 1.9   Smokeless tobacco: Never  Vaping Use   Vaping status: Never Used  Substance Use Topics   Alcohol use: Yes    Comment: rarely   Drug use: Never     Allergies   Gabapentin, Sulfa antibiotics, Erythromycin, Tramadol, Ibuprofen, and Tape   Review of Systems Review of Systems  Constitutional:  Positive for chills and fatigue.  HENT:  Positive for congestion and sore throat.   Respiratory:  Positive for cough and shortness of breath. Negative for wheezing.   Cardiovascular:  Negative for chest pain.  Gastrointestinal:  Negative for abdominal pain, diarrhea, nausea and vomiting.  Neurological:  Positive for headaches.     Physical Exam Triage Vital Signs ED Triage  Vitals  Encounter Vitals Group     BP 12/15/22  1345 (!) 89/60     Systolic BP Percentile --      Diastolic BP Percentile --      Pulse Rate 12/15/22 1345 73     Resp 12/15/22 1345 16     Temp 12/15/22 1345 99.5 F (37.5 C)     Temp Source 12/15/22 1345 Oral     SpO2 12/15/22 1345 97 %     Weight 12/15/22 1344 184 lb (83.5 kg)     Height 12/15/22 1344 5\' 10"  (1.778 m)     Head Circumference --      Peak Flow --      Pain Score 12/15/22 1344 0     Pain Loc --      Pain Education --      Exclude from Growth Chart --    No data found.  Updated Vital Signs BP 95/60 (BP Location: Left Arm)   Pulse 73   Temp 99.5 F (37.5 C) (Oral)   Resp 16   Ht 5\' 10"  (1.778 m)   Wt 184 lb (83.5 kg)   SpO2 97%   BMI 26.40 kg/m   Visual Acuity Right Eye Distance:   Left Eye Distance:   Bilateral Distance:    Right Eye Near:   Left Eye Near:    Bilateral Near:     Physical Exam Vitals and nursing note reviewed.  Constitutional:      Appearance: Normal appearance.  HENT:     Head: Normocephalic and atraumatic.     Right Ear: External ear normal.     Left Ear: External ear normal.     Nose: Nose normal.     Mouth/Throat:     Mouth: Mucous membranes are moist.  Eyes:     Conjunctiva/sclera: Conjunctivae normal.  Cardiovascular:     Rate and Rhythm: Normal rate and regular rhythm.     Heart sounds: Normal heart sounds. No murmur heard. Pulmonary:     Effort: Pulmonary effort is normal. No respiratory distress.     Breath sounds: Normal breath sounds.  Musculoskeletal:        General: Normal range of motion.     Cervical back: Normal range of motion.     Right lower leg: No edema.     Left lower leg: No edema.  Skin:    General: Skin is warm and dry.  Neurological:     Mental Status: She is alert.      UC Treatments / Results  Labs (all labs ordered are listed, but only abnormal results are displayed) Labs Reviewed  SARS CORONAVIRUS 2 (TAT 6-24 HRS)  COMPREHENSIVE  METABOLIC PANEL  CBC WITH DIFFERENTIAL/PLATELET  MAGNESIUM  POCT FASTING CBG KUC MANUAL ENTRY    EKG   Radiology No results found.  Procedures Procedures (including critical care time)  Medications Ordered in UC Medications - No data to display  Initial Impression / Assessment and Plan / UC Course  I have reviewed the triage vital signs and the nursing notes.  Pertinent labs & imaging results that were available during my care of the patient were reviewed by me and considered in my medical decision making (see chart for details).  Vitals and triage reviewed, patient is hemodynamically stable.  She is alert and oriented, able to speak in full sentences.  GCS 15.  Lungs are vesicular posteriorly, heart with RRR.  Oxygenation 97% on room air.  Positive home COVID test, interested in antiviral.  CHF and other comorbidities make her a  candidate for antiviral therapy. Not a candidate for Paxlovid due to medications.  Will trial molnupiravir, insurance may not cover.  Discussed symptomatic management for viral illness.  Will also recheck CBC, CMP and magnesium with recent hospital admission for hypokalemia and hyperglycemia.  CBG normal in clinic.  Plan of care, follow-up care and return precautions given, no questions at this time.  Strict emergency precautions discussed.     Final Clinical Impressions(s) / UC Diagnoses   Final diagnoses:  COVID-19 virus infection  Combined systolic and diastolic congestive heart failure, unspecified HF chronicity (HCC)  History of hypokalemia     Discharge Instructions      You are not a candidate for Paxlovid due to your medications.  Start taking the molnupiravir twice daily for the next 5 days.  You can also do the Tessalon Perles to help with your cough.  Ensure you are staying well-hydrated and getting plenty of rest.  You can take 500 mg of Tylenol as needed for any fevers, body aches or chills.  Do not hesitate to seek care at the nearest  emergency department if your symptoms worsen or do not improve.     ED Prescriptions     Medication Sig Dispense Auth. Provider   benzonatate (TESSALON) 200 MG capsule Take 1 capsule (200 mg total) by mouth every 8 (eight) hours for 10 days. 30 capsule Rinaldo Ratel, [place] N, [place]   molnupiravir EUA (LAGEVRIO) 200 mg CAPS capsule Take 4 capsules (800 mg total) by mouth 2 (two) times daily for 5 days. 40 capsule Lacharles Altschuler, [place] N, [place]      PDMP not reviewed this encounter.   Raylee Adamec, [place] N, [place] 12/15/22 [phone]

## 2022-12-15 NOTE — Telephone Encounter (Signed)
Patient called the answering service this AM concerned that she tested positive for COVID. Her sister had tested positive for COVID yesterday, and this morning she developed a scratchy throat, nasal congestion, and fatigue. She asked what she should look out for symptom wise and if she should take paxlovid.   Discussed that given her history of heart failure, it is important that she monitor her breathing. If she feels short of breath at rest, she should go to the ED. Discussed that as a cardiology office, we usually do not prescribe the COVID antivirals and I encouraged her to reach out to her PCP or visit an urgent care if she would like a prescription   Jonita Albee, PA-C 12/15/2022 12:12 PM

## 2022-12-15 NOTE — ED Triage Notes (Signed)
Patient here today with c/o cough, ST, stuffy nose, headache, chills, and SOB since last night. Her sister tested positive for Covid yesterday. She did a home test today and was positive. Her boyfriend has also been coughing. Has taken Tylenol with some improvement with the headache. She was admitted into the hospital last Friday for low potassium and spent the weekend there.

## 2022-12-16 DIAGNOSIS — Z419 Encounter for procedure for purposes other than remedying health state, unspecified: Secondary | ICD-10-CM | POA: Diagnosis not present

## 2022-12-16 LAB — SARS CORONAVIRUS 2 (TAT 6-24 HRS): SARS Coronavirus 2: POSITIVE — AB

## 2022-12-17 ENCOUNTER — Encounter (HOSPITAL_BASED_OUTPATIENT_CLINIC_OR_DEPARTMENT_OTHER): Payer: Self-pay

## 2022-12-17 ENCOUNTER — Emergency Department (HOSPITAL_BASED_OUTPATIENT_CLINIC_OR_DEPARTMENT_OTHER)
Admission: EM | Admit: 2022-12-17 | Discharge: 2022-12-17 | Disposition: A | Discharge status: Home / Self Care | Payer: Medicaid Other | Source: Home / Self Care | Attending: Emergency Medicine | Admitting: Emergency Medicine

## 2022-12-17 ENCOUNTER — Emergency Department (HOSPITAL_BASED_OUTPATIENT_CLINIC_OR_DEPARTMENT_OTHER): Payer: Medicaid Other

## 2022-12-17 ENCOUNTER — Emergency Department (HOSPITAL_BASED_OUTPATIENT_CLINIC_OR_DEPARTMENT_OTHER): Payer: Medicaid Other | Admitting: Radiology

## 2022-12-17 ENCOUNTER — Other Ambulatory Visit: Payer: Self-pay

## 2022-12-17 DIAGNOSIS — R0602 Shortness of breath: Secondary | ICD-10-CM

## 2022-12-17 DIAGNOSIS — I700 Atherosclerosis of aorta: Secondary | ICD-10-CM | POA: Diagnosis not present

## 2022-12-17 DIAGNOSIS — I2510 Atherosclerotic heart disease of native coronary artery without angina pectoris: Secondary | ICD-10-CM | POA: Insufficient documentation

## 2022-12-17 DIAGNOSIS — U071 COVID-19: Secondary | ICD-10-CM | POA: Insufficient documentation

## 2022-12-17 DIAGNOSIS — Z7901 Long term (current) use of anticoagulants: Secondary | ICD-10-CM | POA: Insufficient documentation

## 2022-12-17 DIAGNOSIS — I509 Heart failure, unspecified: Secondary | ICD-10-CM | POA: Diagnosis not present

## 2022-12-17 LAB — BASIC METABOLIC PANEL
Anion gap: 10 (ref 5–15)
BUN: 17 mg/dL (ref 6–20)
CO2: 25 mmol/L (ref 22–32)
Calcium: 8.6 mg/dL — ABNORMAL LOW (ref 8.9–10.3)
Chloride: 102 mmol/L (ref 98–111)
Creatinine, Ser: 1.01 mg/dL — ABNORMAL HIGH (ref 0.44–1.00)
GFR, Estimated: >60 mL/min (ref >60)
Glucose, Bld: 95 mg/dL (ref 70–99)
Potassium: 3.7 mmol/L (ref 3.5–5.1)
Sodium: 137 mmol/L (ref 135–145)

## 2022-12-17 LAB — CBC
HCT: 40.2 % (ref 36.0–46.0)
Hemoglobin: 13.4 g/dL (ref 12.0–15.0)
MCH: 29.1 pg (ref 26.0–34.0)
MCHC: 33.3 g/dL (ref 30.0–36.0)
MCV: 87.2 fL (ref 80.0–100.0)
Platelets: 303 10*3/uL (ref 150–400)
RBC: 4.61 MIL/uL (ref 3.87–5.11)
RDW: 13.8 % (ref 11.5–15.5)
WBC: 9 10*3/uL (ref 4.0–10.5)
nRBC: 0 % (ref 0.0–0.2)

## 2022-12-17 LAB — TROPONIN I (HIGH SENSITIVITY)
Troponin I (High Sensitivity): 6 ng/L (ref <18)
Troponin I (High Sensitivity): 7 ng/L (ref <18)

## 2022-12-17 LAB — BRAIN NATRIURETIC PEPTIDE: B Natriuretic Peptide: 36.2 pg/mL (ref 0.0–100.0)

## 2022-12-17 MED ORDER — IOHEXOL 350 MG/ML SOLN
100.0000 mL | Freq: Once | INTRAVENOUS | Status: AC | PRN
  Administered 2022-12-17: 75 mL via INTRAVENOUS

## 2022-12-17 MED ORDER — LIDOCAINE VISCOUS HCL 2 % MT SOLN
15.0000 mL | Freq: Once | OROMUCOSAL | Status: AC
  Administered 2022-12-17: 15 mL via OROMUCOSAL
  Filled 2022-12-17: qty 15

## 2022-12-17 NOTE — Progress Notes (Signed)
RT ambulated the Pt  on RA. O2 SATs 99%, HR 78

## 2022-12-17 NOTE — ED Provider Notes (Signed)
[place] EMERGENCY DEPARTMENT AT [facility] Provider Note   CSN: [id number] Arrival date & time: 12/17/22  1535     History  Chief Complaint  Patient presents with   Shortness of Breath    Christine [name] is a 57 y.o. female with PMHx CHF, CAD, anxiety, HLD who presents to ED concerned for intermittent SOB, chills, HA, nasal congestion x4 days. Patient diagnosed with COVID 3 day ago. Of note, patient was hospitalized for hypokalemia 11 days ago and had lasix readjusted. Patient stating that she gained 15lb during the 2 days that she was off lasix - and has only loss 9lbs since discharged on 40mg  lasix daily. Started Molnupiravir after COVID diagnosis and reports some symptom resolution. SOB not associated with position, exertion, rest, etc.  Denies nausea, vomiting, diarrhea, hematochezia. Denies leg swelling, calf tenderness, hemoptysis, cancer dx, history DVT/PE.   Shortness of Breath      Home Medications Prior to Admission medications   Medication Sig Start Date End Date Taking? Authorizing Provider  Accu-Chek Softclix Lancets lancets Use as instructed.  Check blood sugar daily before breakfast. 11/02/22   Marcine Matar, MD  acetaminophen (TYLENOL) 500 MG tablet Take 1,000 mg by mouth 3 (three) times daily as needed for moderate pain or headache.    [provider]  albuterol (VENTOLIN HFA) 108 (90 Base) MCG/ACT inhaler Inhale 1-2 puffs into the lungs every 6 (six) hours as needed for wheezing or shortness of breath. 12/15/22   Garrison, [place] N, FNP  ALPRAZolam Prudy Feeler) 1 MG tablet Take 2 mg by mouth 3 (three) times daily.    [provider]  alum & mag hydroxide-simeth (MAALOX/MYLANTA) 200-200-20 MG/5ML suspension Take 30 mLs by mouth every 4 (four) hours as needed for indigestion or heartburn. 03/02/22   Atway, Rayann N, DO  apixaban (ELIQUIS) 5 MG TABS tablet Take 1 tablet (5 mg total) by mouth 2 (two) times daily. 11/05/22   Bensimhon, Bevelyn Buckles, MD  atorvastatin (LIPITOR) 80 MG tablet Take 1 tablet (80 mg total) by mouth daily. 01/19/22   Laurey Morale, MD  benzonatate (TESSALON) 200 MG capsule Take 1 capsule (200 mg total) by mouth every 8 (eight) hours for 10 days. 12/15/22 12/25/22  Garrison, [place] N, FNP  Blood Glucose Monitoring Suppl (ACCU-CHEK GUIDE) w/Device KIT Check blood sugar once daily before breakfast 11/02/22   Marcine Matar, MD  buPROPion (WELLBUTRIN XL) 150 MG 24 hr tablet Take 150 mg by mouth daily.    [provider]  cyclobenzaprine (FLEXERIL) 10 MG tablet Take 20 mg by mouth at bedtime.    [provider]  dapagliflozin propanediol (FARXIGA) 10 MG TABS tablet Take 1 tablet (10 mg total) by mouth daily. 10/23/22   Bensimhon, Bevelyn Buckles, MD  diphenhydrAMINE (BENADRYL) 25 MG tablet Take 50 mg by mouth as needed for allergies.    [provider]  diphenoxylate-atropine (LOMOTIL) 2.5-0.025 MG tablet Take 2 tablets by mouth 4 (four) times daily as needed for diarrhea/loose stools. Patient taking differently: Take 2 tablets by mouth 4 (four) times daily as needed for diarrhea or loose stools. 11/19/22   Cirigliano, Vito V, DO  feeding supplement (ENSURE ENLIVE / ENSURE PLUS) LIQD Take 237 mLs by mouth 2 (two) times daily between meals. 03/03/22   Atway, Rayann N, DO  Fezolinetant (VEOZAH) 45 MG TABS Take 1 tablet (45 mg total) by mouth daily. 11/08/22   Marcine Matar, MD  furosemide (LASIX) 20 MG tablet Take 2  tablets (40 mg total) by mouth 2 (two) times daily. 12/12/22   Jacklynn Ganong, FNP  glucose blood (ACCU-CHEK GUIDE) test strip Use as instructed to check blood sugar daily before breakfast 11/02/22   Marcine Matar, MD  ivabradine (CORLANOR) 5 MG TABS tablet Take 1 tablet (5 mg total) by mouth 2 (two) times daily. 11/08/22   Marcine Matar, MD  lipase/protease/amylase (CREON) 36000 UNITS CPEP capsule Take 2 capsules ( 72000 units) with meals and 1 capsule ( 36000 units) with  snack. Patient taking differently: 36,000 Units with breakfast, with lunch, and with evening meal. Take 1 tablet before breakfast 03/13/22   Cirigliano, Vito V, DO  metoprolol succinate (TOPROL-XL) 25 MG 24 hr tablet Take 1/2 tablet (12.5 mg total) by mouth daily. 12/10/22   Joseph Art, DO  metroNIDAZOLE (METROGEL) 1 % gel Apply 1 Application topically as needed (rash).    [provider]  molnupiravir EUA (LAGEVRIO) 200 mg CAPS capsule Take 4 capsules (800 mg total) by mouth 2 (two) times daily for 5 days. 12/15/22 12/20/22  Garrison, [place] N, FNP  Multiple Vitamins-Minerals (MULTIVITAMIN WITH MINERALS) tablet Take 1 tablet by mouth daily.    [provider]  oxymetazoline (AFRIN) 0.05 % nasal spray Place 1-2 sprays into both nostrils 2 (two) times daily as needed for congestion.    [provider]  potassium chloride SA (KLOR-CON M) 20 MEQ tablet Take 2 tablets (40 mEq total) by mouth 2 (two) times daily. 12/12/22   Milford, Anderson Malta, FNP  prazosin (MINIPRESS) 2 MG capsule Take 2-6 mg by mouth at bedtime. 10/24/22   [provider]  promethazine (PHENERGAN) 25 MG tablet Take 1 tablet (25 mg total) by mouth every 6 (six) to 8 (eight) hours for nausea or vomiting. 11/22/22   Cirigliano, Vito V, DO  sitaGLIPtin (JANUVIA) 50 MG tablet Take 1 tablet (50 mg total) by mouth daily. 11/02/22   Marcine Matar, MD  spironolactone (ALDACTONE) 25 MG tablet Take 1 tablet (25 mg total) by mouth daily. 11/30/22   Bensimhon, Bevelyn Buckles, MD  zolpidem (AMBIEN) 10 MG tablet Take 5-10 mg by mouth at bedtime.    [provider]      Allergies    Gabapentin, Sulfa antibiotics, Erythromycin, Tramadol, Ibuprofen, and Tape    Review of Systems   Review of Systems  Respiratory:  Positive for shortness of breath.     Physical Exam Updated Vital Signs BP 115/66   Pulse 77   Temp 98.4 F (36.9 C) (Oral)   Resp 18   Ht 5\' 10"  (1.778 m)   Wt 84.4 kg   SpO2 98%   BMI  26.69 kg/m  Physical Exam Vitals and nursing note reviewed.  Constitutional:      General: She is not in acute distress. HENT:     Head: Normocephalic and atraumatic.     Mouth/Throat:     Mouth: Mucous membranes are moist.     Pharynx: No oropharyngeal exudate or posterior oropharyngeal erythema.  Eyes:     General: No scleral icterus.       Right eye: No discharge.        Left eye: No discharge.     Conjunctiva/sclera: Conjunctivae normal.  Neck:     Vascular: No JVD.  Cardiovascular:     Rate and Rhythm: Normal rate and regular rhythm.     Pulses: Normal pulses.     Heart sounds: Normal heart sounds. No murmur heard.  Comments: No JVD or pitting edema Pulmonary:     Effort: Pulmonary effort is normal. No respiratory distress.     Breath sounds: Normal breath sounds. No wheezing, rhonchi or rales.  Abdominal:     General: Bowel sounds are normal.     Palpations: Abdomen is soft. There is no mass.     Tenderness: There is no abdominal tenderness.  Musculoskeletal:     Right lower leg: No edema.     Left lower leg: No edema.     Comments: +2 radial and pedal pulses  Skin:    General: Skin is warm and dry.     Findings: No rash.  Neurological:     General: No focal deficit present.     Mental Status: She is alert. Mental status is at baseline.  Psychiatric:        Mood and Affect: Mood normal.     ED Results / Procedures / Treatments   Labs (all labs ordered are listed, but only abnormal results are displayed) Labs Reviewed  BASIC METABOLIC PANEL - Abnormal; Notable for the following components:      Result Value   Creatinine, Ser 1.01 (*)    Calcium 8.6 (*)    All other components within normal limits  CBC  BRAIN NATRIURETIC PEPTIDE  TROPONIN I (HIGH SENSITIVITY)    EKG None  Radiology No results found.  Procedures Procedures    Medications Ordered in ED Medications - No data to display  ED Course/ Medical Decision Making/ A&P                                  Medical Decision Making Amount and/or Complexity of Data Reviewed Labs: ordered. Radiology: ordered.   This patient presents to the ED for concern of shortness of breath, this involves an extensive number of treatment options, and is a complaint that carries with it a high risk of complications and morbidity.  The differential diagnosis includes Anxiety, Anaphylaxis/Angioedema, Aspirated FB, Arrhythmia, CHF, Asthma, COPD, PNA, COVID/Flu/RSV, STEMI, Tamponade, TPNX, Sepsis   Co morbidities that complicate the patient evaluation  CHF, CAD, anxiety, HLD    Lab Tests:  I Ordered, and personally interpreted labs.  The pertinent results include:   - BMP: no concern for electrolyte abnormality; no concern for kidney damage - BNP: within normal limits - Trop: initial and repeat within normal limits - CBC: No concern for anemia or leukocytosis   Imaging Studies ordered:  I ordered imaging studies including  -Chest xray: to assess for process contributing to patient's symptoms -CTA chest: to assess for PE and PNA not seen on chest xray  I independently visualized and interpreted imaging I agree with the radiologist interpretation   Cardiac Monitoring: / EKG:  The patient was maintained on a cardiac monitor.  I personally viewed and interpreted the cardiac monitored which showed an underlying rhythm of: normal sinus rhythm   Problem List / ED Course / Critical interventions / Medication management  Patient presents to ED concerned for SOB, chills, HA, nasal congestion x4 days. Patient diagnosed with COVID 3 days ago. Physical exam unremarkable. Overall, patient is well-appearing and not in acute distress. Patient ambulated in ED with O2 >99% on RA and HR in the 70's.  Patient stating that she is concerned since her symptoms have not yet resolved - given patient's symptoms and recent hospitalization, I am obtaining CTA to rule out PE  and further assess for PNA not seen  on chest xray. Delta troponin negative.  BNP within normal limits.  CBC without leukocytosis or anemia.  BMP without electrolyte abnormalities.  BUN/Cr within normal limits.  Chest x-ray without acute cardiopulmonary disease.  EKG reassuring. CTA without concern for PE, PNA, or other cardiopulmonary disease. Shared results with patient. Recommended following up with PCP/cardiologist. Patient verbalized understanding of plan. Patient stating that she is ready to go home. Patient asking for one dose of oral lidocaine for her sore throat before discharge which I have provided. I have reviewed the patients home medicines and have made adjustments as needed Patient was given return precautions. Patient stable for discharge at this time. Patient verbalized understanding of plan.  DDx: These are considered less likely due to history of present illness and physical exam findings Aspirated FB: no history of choking Arrhythmia/STEMI: EKG without concern Asthma/COPD/PNA/COVID/Flu/RSV: Lungs clear to auscultation bilaterally and O2 sat 100% Tamponade: CT without concern CHF: no physical exam findings TPNX: Lungs clear to auscultation bilaterally Sepsis: afebrile and other vital signs stable Pulmonary Embolism: CT without concern    Social Determinants of Health:  none          Final Clinical Impression(s) / ED Diagnoses Final diagnoses:  None    Rx / DC Orders ED Discharge Orders     None         Margarita Rana 12/17/22 2034    Rondel Baton, MD 12/18/22 1224

## 2022-12-17 NOTE — ED Triage Notes (Signed)
Pt to ED c/o [facility] . Reports tested positive for COVID yesterday. Of note, pt was hospitalized 1 week ago for low potassium, lasix was stopped while in hospital and reports a 15 pound weight gain . Back on lasix but lower dose.

## 2022-12-17 NOTE — Discharge Instructions (Signed)
It was a pleasure caring for you today. Imaging and lab workup was reassuring. Please follow up with your cardiologist or primary care provider in the next 48-72 hours. Seek emergency care if experiencing any new or worsening symptoms.

## 2022-12-18 ENCOUNTER — Ambulatory Visit: Payer: Medicaid Other

## 2022-12-18 DIAGNOSIS — L539 Erythematous condition, unspecified: Secondary | ICD-10-CM | POA: Diagnosis not present

## 2022-12-19 ENCOUNTER — Ambulatory Visit (HOSPITAL_COMMUNITY)
Admission: RE | Admit: 2022-12-19 | Discharge: 2022-12-19 | Disposition: A | Discharge status: Home / Self Care | Payer: Medicaid Other | Source: Ambulatory Visit | Attending: Cardiology | Admitting: Cardiology

## 2022-12-19 ENCOUNTER — Telehealth: Payer: Self-pay

## 2022-12-19 ENCOUNTER — Other Ambulatory Visit: Payer: Medicaid Other

## 2022-12-19 DIAGNOSIS — I5042 Chronic combined systolic (congestive) and diastolic (congestive) heart failure: Secondary | ICD-10-CM | POA: Diagnosis not present

## 2022-12-19 LAB — BASIC METABOLIC PANEL
Anion gap: 14 (ref 5–15)
BUN: 12 mg/dL (ref 6–20)
CO2: 27 mmol/L (ref 22–32)
Calcium: 9.4 mg/dL (ref 8.9–10.3)
Chloride: 99 mmol/L (ref 98–111)
Creatinine, Ser: 0.87 mg/dL (ref 0.44–1.00)
GFR, Estimated: >60 mL/min (ref >60)
Glucose, Bld: 97 mg/dL (ref 70–99)
Potassium: 3.6 mmol/L (ref 3.5–5.1)
Sodium: 140 mmol/L (ref 135–145)

## 2022-12-19 LAB — BRAIN NATRIURETIC PEPTIDE: B Natriuretic Peptide: 35.7 pg/mL (ref 0.0–100.0)

## 2022-12-19 LAB — MAGNESIUM: Magnesium: 2.2 mg/dL (ref 1.7–2.4)

## 2022-12-19 NOTE — Patient Instructions (Signed)
Visit Information  Dear Christine [name], I'm so sorry to hear that you have Covid but you sought treatment at an urgent Care, are on an antiviral to help you recover faster, and sound as if you are feeling much better than you did when you needed to seek additional treatment for shortness of breath at [facility]. You really are managing your healthcare issues very well and wisely.  Thank you for taking time out of your day to continue to discuss with me your recovery progress from your recent hospitalization for exacerbation CHF. Glad to hear that you are diuresing slowly but steadily to get to your optimum fluid-volume level. Please don't hesitate to contact me if I can be of assistance to you before our next scheduled telephone appointment.  Our next appointment is by telephone on September 11 at 10;30am  Following is a copy of your care plan:   Goals Addressed             This Visit's Progress    Transition of Care       Current Barriers:  Chronic Disease Management support and education needs related to CHF  RNCM Clinical Goal(s):  Patient will verbalize understanding of plan for management of CHF as evidenced by patient explaining her daily steps to managing her CHF, such as weighing self daily, taking meds as prescribed, reporting worsening symptoms of fluid retention, difficulty breathing immediately to her MD take all medications exactly as prescribed and will call provider for medication related questions as evidenced by patient voicing her active participation in her well-being and expressing understanding of what signs and symptoms of worsening Congestive Heart Failure need to be immediately reported to her MD    demonstrate understanding of rationale for each prescribed medication as evidenced by describing the actions of her various meds such as diuretics, and cardiac meds to manage her CHF. As of 8/28 is keeping log of daily weights and signs & symptoms of increasing fluid retention. As of  9/4, patient states she has only 6-7 pounds to go to be within normal range of her fluid-volume weight - she has slowly and consistently been losing fluid weight since being placed back on Lasix 40mg  twice daily    attend all scheduled medical appointments: will report adherence as evidenced by appt completion documentation in EMR. Patient has scheduled lab draw 9/4 at 1:45pm, and scheduled appt with Dr. Prescott Gum PA on 9/9        continue to work with RN Care Manager and/or Social Worker to address care management and care coordination needs related to CHF as evidenced by adherence to CM Team Scheduled appointments     through collaboration with RN Care Manager, Alyse Low, PCP Jonah Blue and care team, and Cardiology team of Dr. Prescott Gum.  Interventions: Evaluation of current treatment plan related to self management and patient's adherence to plan as established by provider   Patient Goals/Self-Care Activities: Take medications as prescribed   Attend all scheduled provider appointments Call pharmacy for medication refills 3-7 days in advance of running out of medications Call provider office for new concerns or questions  call office if I gain more than 2 pounds in one day or 5 pounds in one week keep legs up while sitting track weight in diary use salt in moderation watch for swelling in feet, ankles and legs every day weigh myself daily begin a heart failure diary bring diary to all appointments develop a rescue plan follow rescue plan if symptoms flare-up know when  to call the doctor: worsening signs and symptoms of fluid retention, difficulty breathing warrant immediate call to MD track symptoms and what helps feel better or worse           Patient verbalizes understanding of instructions and care plan provided today and agrees to view in MyChart. Active MyChart status and patient understanding of how to access instructions and care plan via MyChart confirmed with  patient.     The patient has been provided with contact information for the care management team and has been advised to call with any health related questions or concerns.   Please call the care guide team at [phone] if you need to cancel or reschedule your appointment.   Please call 1-800-273-TALK (toll free, 24 hour hotline) if you are experiencing a Mental Health or Behavioral Health Crisis or need someone to talk to.  Alyse Low, RN, BA, [facility], CRRN [facility] Coordinator, Transition of Care Ph # [phone]

## 2022-12-19 NOTE — Patient Outreach (Signed)
Care Management  Transitions of Care Program Managed Medicaid Transitions of Care week 2   12/19/2022 Name: Christine [name] MRN: [id number] DOB: May 14, 1965  Subjective: Christine [name] is a 57 y.o. year old female who is a primary care patient of Marcine Matar, MD. The Care Management team Engaged with patient Engaged with patient by telephone to assess and address transitions of care needs.   Consent to Services:  Patient was given information about care management services, agreed to services, and gave verbal consent to participate.   Assessment:           SDOH Interventions    Flowsheet Row Telephone from 12/13/2022 in [place] [facility]PART[facility] Telephone from 10/01/2022 in [facility] Office Visit from 03/20/2022 in [facility] from 01/31/2021 in [facility]  SDOH Interventions      Food Insecurity Interventions -- Assist with SNAP Application -- Intervention Not Indicated  Housing Interventions -- -- -- Intervention Not Indicated  Transportation Interventions -- -- -- Intervention Not Indicated  Depression Interventions/Treatment  Medication, Currently on Treatment -- Currently on Treatment --  Financial Strain Interventions -- -- -- Artist        Goals Addressed             This Visit's Progress    Transition of Care       Current Barriers:  Chronic Disease Management support and education needs related to CHF  RNCM Clinical Goal(s):  Patient will verbalize understanding of plan for management of CHF as evidenced by patient explaining her daily steps to managing her CHF, such as weighing self daily, taking meds as prescribed, reporting worsening symptoms of fluid retention, difficulty breathing immediately to her MD take all medications exactly as prescribed and will call provider for  medication related questions as evidenced by patient voicing her active participation in her well-being and expressing understanding of what signs and symptoms of worsening Congestive Heart Failure need to be immediately reported to her MD    demonstrate understanding of rationale for each prescribed medication as evidenced by describing the actions of her various meds such as diuretics, and cardiac meds to manage her CHF. As of 8/28 is keeping log of daily weights and signs & symptoms of increasing fluid retention. As of 9/4, patient states she has only 6-7 pounds to go to be within normal range of her fluid-volume weight - she has slowly and consistently been losing fluid weight since being placed back on Lasix 40mg  twice daily    attend all scheduled medical appointments: will report adherence as evidenced by appt completion documentation in EMR. Patient has scheduled lab draw 9/4 at 1:45pm, and scheduled appt with Dr. Prescott Gum PA on 9/9        continue to work with RN Care Manager and/or Social Worker to address care management and care coordination needs related to CHF as evidenced by adherence to CM Team Scheduled appointments     through collaboration with RN Care Manager, Alyse Low, PCP Jonah Blue and care team, and Cardiology team of Dr. Prescott Gum.  Interventions: Evaluation of current treatment plan related to self management and patient's adherence to plan as established by provider   Patient Goals/Self-Care Activities: Take medications as prescribed   Attend all scheduled provider appointments Call pharmacy for medication refills 3-7 days in advance of running out of medications Call provider office for new  concerns or questions  call office if I gain more than 2 pounds in one day or 5 pounds in one week keep legs up while sitting track weight in diary use salt in moderation watch for swelling in feet, ankles and legs every day weigh myself daily begin a heart failure  diary bring diary to all appointments develop a rescue plan follow rescue plan if symptoms flare-up know when to call the doctor: worsening signs and symptoms of fluid retention, difficulty breathing warrant immediate call to MD track symptoms and what helps feel better or worse           Plan: Telephone follow-up appointment with care management team member scheduled for September 11, at 1030am  Alyse Low, RN, BA, [facility], CRRN [facility] Population Health Care Management Coordinator, Transition of Care  Ph # [phone]

## 2022-12-19 NOTE — Telephone Encounter (Signed)
Referred to ICM clinic by Prince Rome, NP at HF clinic (per 8/28 phone note).   Spoke with patient and ICM intro given.  Patient is agreeable to ICM monthly follow up.  Advised transmission will send automatically when scheduled.  Discussed recent hospitalization for hypokalemia and she gained 15 lbs of fluid weight due to Lasix being stopped during hospitalization and at discharge.  Lasix restarted by HF clinic but at about half the dose she was taking prior to hospitalization.  She has HF clinic follow up 9/9.  Today,s weight is 185.5 lbs and she is about 7.5 lbs over her baseline weight. She is also recovering from Covid.    Provided ICM number and encouraged her to call if she develops fluid symptoms prior to scheduled monthly ICM remote transmissions.  1st ICM remote transmission scheduled for 12/31/2022.

## 2022-12-20 NOTE — Progress Notes (Signed)
Advanced [facility] Failure Clinic Note   PCP: Marcine Matar, MD Cardiology: Dr. Duke Salvia HF Cardiologist: Dr. Gala Romney  HPI: Christine [name] is a 57 y.o.female with history of chronic tobacco use, ADHD, anxiety, depression, CAD, systolic HF   Presented to ED 16/1/09 with increased shortness of breath/tachycardia. Adderrall stopped. Echo with EF 20-25%,  LHC/RHC with single vessel CAD (occluded mLAD) elevated filling pressures and  moderately reduced CO. Digoxin added. Beta blocker stopped.  Discharged to home 01/25/21. Discharge weight 213 pounds.   Echo 05/11/21 EF < 20% LV severely dilated RV ok Mild MR. Referred for ICD.  CPX with very mild HF limitation with elevated Ve/VCO2 slope  S/p [employer] ICD 4/23.  Saw PCP 08/10/21 for dizziness and anxiety. Felt insomnia related, labs unremarkable.  Seen in ED 08/18/21 with sudden onset SOB. CXR and EKG re-assuring, Trop x 2 normal. BNP mildly up, advised admission to further work up. She declined and was elected to discharge home.  Seen in ED 10/06/21 for urinary retention, required foley. Felt to be 2/2 to prazosin and this was stopped.  Admitted (03/01/22) with CP and chronic diarrhea. Hstrop negative. Felt to have Crohn's disease. Losartan and spiro stopped due to low BP.  Following with GI (Dr. Bethann Berkshire)  Multiple ED visits for various issues.   Monitor in 12/23: Rare PVCs 2 episodes of brief SVT (4 beats)   Follow up 3/24, reported NYHA III symptoms but objective findings stable. Repeat echo (4/24) showed improved EF 35-40%, garde 1 DD, RV ok  Hospitalized 8/24 for hypokalemia. K 2.3. Repleted. Discharged next day.   Recently covid + 12/15/22. Completed Molnupiravir.   Today she returns for post hospital follow up. Overall feeling ***. Denies palpitations, CP, dizziness, edema, or PND/Orthopnea. *** SOB. Appetite ok. No fever or chills. Weight at home *** pounds. Taking all medications. ETOH, smoking ***   Cardiac Testing   - Echo (4/24): EF 35-40%, garde 1 DD, RV ok - Echo (05/11/21): EF < 20% LV severely dilated RV ok Mild MR  - Echo (10/22): EF 20-25% LV severely dilated, LV apical mural thrombus, RV okay, moderate MR. - R/LHC (10/22): w/ severe 1v CAD with occlusion of mid LAD., PCW 27, CO 4.7, CI 2.2  - cMRI (10/22): demonstrated subendocardial LGE consistent with prior infarcts in LV basal inferolateral wall, apical anterior/septal/inferior walls and apex. LVEF 22%. - CPX 3/23: FVC 3.60 (85%)      FEV1 2.89 (87%)        FEV1/FVC 80 (100%)        MVV 171 (157%)   Resting HR: 113 Standing HR: 113 Peak HR: 149   (89 % age predicted max HR)  BP rest: 98/60 Standing BP: 90/62 BP peak: 134/58  Peak VO2: 18.8 (90% predicted peak VO2)  VE/VCO2 slope:  33 Peak RER: 1.14  VE/MVV:  38% O2pulse:  11   (100 % predicted O2pulse)   ROS: All systems negative except as listed in HPI, PMH and Problem List.  SH:  Social History   Socioeconomic History   Marital status: Single    Spouse name: Not on file   Number of children: 0   Years of education: 16   Highest education level: Bachelor's degree (e.g., BA, AB, BS)  Occupational History   Not on file  Tobacco Use   Smoking status: Former    Current packs/day: 0.00    Types: Cigarettes    Quit date: 01/15/2021    Years since quitting: 1.9  Smokeless tobacco: Never  Vaping Use   Vaping status: Never Used  Substance and Sexual Activity   Alcohol use: Yes    Comment: rarely   Drug use: Never   Sexual activity: Not on file  Other Topics Concern   Not on file  Social History Narrative   Not on file   Social Determinants of Health   Financial Resource Strain: Medium Risk (12/13/2022)   Overall Financial Resource Strain (CARDIA)    Difficulty of Paying Living Expenses: Somewhat hard  Food Insecurity: No Food Insecurity (12/13/2022)   Hunger Vital Sign    Worried About Running Out of Food in the Last Year: Never true    Ran Out of Food in the Last  Year: Never true  Transportation Needs: No Transportation Needs (12/13/2022)   PRAPARE - Administrator, Civil Service (Medical): No    Lack of Transportation (Non-Medical): No  Physical Activity: Insufficiently Active (09/12/2022)   Exercise Vital Sign    Days of Exercise per Week: 4 days    Minutes of Exercise per Session: 20 min  Stress: No Stress Concern Present (09/12/2022)   [employer] of Occupational Health - Occupational Stress Questionnaire    Feeling of Stress : Only a little  Social Connections: Moderately Isolated (09/12/2022)   Social Connection and Isolation Panel [NHANES]    Frequency of Communication with Friends and Family: Twice a week    Frequency of Social Gatherings with Friends and Family: Once a week    Attends Religious Services: 1 to 4 times per year    Active Member of [employer] or Organizations: No    Attends Engineer, structural: Not on file    Marital Status: Never married  Intimate Partner Violence: Unknown (07/21/2021)   Received from [employer], [facility]   HITS    Physically Hurt: Not on file    Insult or Talk Down To: Not on file    Threaten Physical Harm: Not on file    Scream or Curse: Not on file   FH:  Family History  Problem Relation Age of Onset   Stroke Mother    Atrial fibrillation Mother    Heart failure Father    Stomach cancer Maternal Grandfather    Liver cancer Neg Hx    Esophageal cancer Neg Hx    Colon polyps Neg Hx    Colon cancer Neg Hx    Past Medical History:  Diagnosis Date   Allergy    Anxiety 01/20/2021   CAD in native artery 08/24/2021   CHF (congestive heart failure) (HCC)    Chronic combined systolic and diastolic heart failure (HCC) 08/24/2021   Complication of anesthesia    Depression    Hyperlipidemia    Myocardial infarction [facility])    Presence of cardiac defibrillator 07/2021   Shortness of breath 01/20/2021   Sleep apnea 07/2021   Tobacco abuse 01/20/2021   Current Outpatient  Medications  Medication Sig Dispense Refill   Accu-Chek Softclix Lancets lancets Use as instructed.  Check blood sugar daily before breakfast. 100 each 12   acetaminophen (TYLENOL) 500 MG tablet Take 1,000 mg by mouth 3 (three) times daily as needed for moderate pain or headache.     albuterol (VENTOLIN HFA) 108 (90 Base) MCG/ACT inhaler Inhale 1-2 puffs into the lungs every 6 (six) hours as needed for wheezing or shortness of breath. 18 g 0   ALPRAZolam (XANAX) 1 MG tablet Take 2 mg by mouth 3 (three)  times daily.     alum & mag hydroxide-simeth (MAALOX/MYLANTA) 200-200-20 MG/5ML suspension Take 30 mLs by mouth every 4 (four) hours as needed for indigestion or heartburn. 355 mL 0   apixaban (ELIQUIS) 5 MG TABS tablet Take 1 tablet (5 mg total) by mouth 2 (two) times daily. 60 tablet 11   atorvastatin (LIPITOR) 80 MG tablet Take 1 tablet (80 mg total) by mouth daily. 90 tablet 3   benzonatate (TESSALON) 200 MG capsule Take 1 capsule (200 mg total) by mouth every 8 (eight) hours for 10 days. 30 capsule 0   Blood Glucose Monitoring Suppl (ACCU-CHEK GUIDE) w/Device KIT Check blood sugar once daily before breakfast 1 kit 0   buPROPion (WELLBUTRIN XL) 150 MG 24 hr tablet Take 150 mg by mouth daily.     cyclobenzaprine (FLEXERIL) 10 MG tablet Take 20 mg by mouth at bedtime.     dapagliflozin propanediol (FARXIGA) 10 MG TABS tablet Take 1 tablet (10 mg total) by mouth daily. 30 tablet 9   diphenhydrAMINE (BENADRYL) 25 MG tablet Take 50 mg by mouth as needed for allergies.     diphenoxylate-atropine (LOMOTIL) 2.5-0.025 MG tablet Take 2 tablets by mouth 4 (four) times daily as needed for diarrhea/loose stools. (Patient taking differently: Take 2 tablets by mouth 4 (four) times daily as needed for diarrhea or loose stools.) 60 tablet 3   feeding supplement (ENSURE ENLIVE / ENSURE PLUS) LIQD Take 237 mLs by mouth 2 (two) times daily between meals. 237 mL 12   Fezolinetant (VEOZAH) 45 MG TABS Take 1 tablet (45  mg total) by mouth daily. 30 tablet 2   furosemide (LASIX) 20 MG tablet Take 2 tablets (40 mg total) by mouth 2 (two) times daily. 120 tablet 3   glucose blood (ACCU-CHEK GUIDE) test strip Use as instructed to check blood sugar daily before breakfast 100 each 12   ivabradine (CORLANOR) 5 MG TABS tablet Take 1 tablet (5 mg total) by mouth 2 (two) times daily. 60 tablet 2   lipase/protease/amylase (CREON) 36000 UNITS CPEP capsule Take 2 capsules ( 72000 units) with meals and 1 capsule ( 36000 units) with snack. (Patient taking differently: 36,000 Units with breakfast, with lunch, and with evening meal. Take 1 tablet before breakfast) 300 capsule 5   metoprolol succinate (TOPROL-XL) 25 MG 24 hr tablet Take 1/2 tablet (12.5 mg total) by mouth daily. 90 tablet 0   metroNIDAZOLE (METROGEL) 1 % gel Apply 1 Application topically as needed (rash).     molnupiravir EUA (LAGEVRIO) 200 mg CAPS capsule Take 4 capsules (800 mg total) by mouth 2 (two) times daily for 5 days. 40 capsule 0   Multiple Vitamins-Minerals (MULTIVITAMIN WITH MINERALS) tablet Take 1 tablet by mouth daily.     oxymetazoline (AFRIN) 0.05 % nasal spray Place 1-2 sprays into both nostrils 2 (two) times daily as needed for congestion.     potassium chloride SA (KLOR-CON M) 20 MEQ tablet Take 2 tablets (40 mEq total) by mouth 2 (two) times daily. 120 tablet 6   prazosin (MINIPRESS) 2 MG capsule Take 2-6 mg by mouth at bedtime.     promethazine (PHENERGAN) 25 MG tablet Take 1 tablet (25 mg total) by mouth every 6 (six) to 8 (eight) hours for nausea or vomiting. 90 tablet 2   sitaGLIPtin (JANUVIA) 50 MG tablet Take 1 tablet (50 mg total) by mouth daily. 30 tablet 1   spironolactone (ALDACTONE) 25 MG tablet Take 1 tablet (25 mg total) by mouth daily.  90 tablet 3   zolpidem (AMBIEN) 10 MG tablet Take 5-10 mg by mouth at bedtime.     No current facility-administered medications for this visit.   There were no vitals taken for this visit.  Wt  Readings from Last 3 Encounters:  12/17/22 84.4 kg (186 lb)  12/15/22 83.5 kg (184 lb)  12/09/22 83.6 kg (184 lb 6.4 oz)   PHYSICAL EXAM: General:  *** appearing.  No respiratory difficulty HEENT: normal Neck: supple. JVD *** cm. Carotids 2+ bilat; no bruits. No lymphadenopathy or thyromegaly appreciated. Cor: PMI nondisplaced. Regular rate & rhythm. No rubs, gallops or murmurs. Lungs: clear Abdomen: soft, nontender, nondistended. No hepatosplenomegaly. No bruits or masses. Good bowel sounds. Extremities: no cyanosis, clubbing, rash, edema  Neuro: alert & oriented x 3, cranial nerves grossly intact. moves all 4 extremities w/o difficulty. Affect pleasant.   ICD interrogation (personally reviewed): HL Score 1, fluid ok, average HR 69 bpm, no VT. Activity level 1.0 hr/day. ***  ECG (personally reviewed): NSR 68 bpm, no ectopy  ASSESSMENT & PLAN: 1. 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 Echo EF 20-25%, severely dilated LV, RV okay, moderate MR - R/LHC (10/22): 1v CAD occluded mid LAD PCWP 27,  Fick 4.7/2.  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 No viability. RV okay. Not sure how to explain inferior defects on cMRI  - Echo (05/11/21) EF < 20% RV ok - CPX 1/23 with very mild HF limitation:  Peak VO2: 18.8 (90% predicted peak VO2) VE/VCO2 slope: 33 Peak RER: 1.14  - s/p [employer] ICD 4/23. - Echo (4/24): EF 35-40%, RV ok - Reports NYHA II-early III symptoms, but objective findings are stable. Volume stable on exam and by device interrogation. - With improving EF, stop digoxin. - Continue Lasix 80 mg bid + 100 KCL bid - Continue metolazone 2.5 mg/40 KCL PRN (uses every 2-3 weeks), refill today. - Continue Ivabradine 5 mg bid.  - Continue Toprol XL 25 mg daily. - Continue spiro 25 mg daily. - Continue Farxiga 10 mg daily.  - Off losartan due to low BP. BP  too low to re-challenge - Labs today.   2. Palpitations - No longer on Adderall. - Zio 14-day (11/22) showed mostly SR, no high-grade arrhythmias. - Monitor 12/23 SR. Two brief runs SVT (4 beats)  - ECG stable today.  3. CAD - Single vessel LAD occlusion on LHC. - No s/s ischemia - Continue statin. - No ASA with Eliquis.   4. Tobacco use - Remains quit from cigarettes.  5. LV apical thrombus - Noted on echo/cMRI.  - none on most recent echo 4/24. - On Eliquis. No bleeding issues.   6. Diarrhea  - Following with GI. Felt to have Crohn's disease  7. OSA - Mild on sleep study, AHI 11.8 - Follows with Dr. Mayford Knife, referred to sleep dentistry to discuss oral device.  8. Polycythemia - Follows with Heme/Onc. - JAK negative  9. AAA - CT scan 11/23  3.1cm - Follow u/s in 3 years recommended (02/2025)  10. Hypokalemia - Chronic, recent K 3.6 - She in on KCL 100 bid *** - BMET today.  Follow up in 6 months with Dr. Glenice Laine AGACNP-BC  3:46 PM

## 2022-12-24 ENCOUNTER — Encounter (HOSPITAL_COMMUNITY): Payer: Self-pay

## 2022-12-24 ENCOUNTER — Encounter (HOSPITAL_COMMUNITY): Payer: Medicaid Other

## 2022-12-24 ENCOUNTER — Telehealth (HOSPITAL_COMMUNITY): Payer: Self-pay

## 2022-12-24 ENCOUNTER — Other Ambulatory Visit (HOSPITAL_COMMUNITY): Payer: Self-pay

## 2022-12-24 ENCOUNTER — Ambulatory Visit (HOSPITAL_COMMUNITY)
Admission: RE | Admit: 2022-12-24 | Discharge: 2022-12-24 | Disposition: A | Discharge status: Home / Self Care | Payer: Medicaid Other | Source: Ambulatory Visit | Attending: Internal Medicine | Admitting: Internal Medicine

## 2022-12-24 ENCOUNTER — Telehealth: Payer: Self-pay

## 2022-12-24 ENCOUNTER — Other Ambulatory Visit: Payer: Self-pay

## 2022-12-24 VITALS — BP 96/64 | HR 70 | Wt 181.6 lb

## 2022-12-24 DIAGNOSIS — I513 Intracardiac thrombosis, not elsewhere classified: Secondary | ICD-10-CM

## 2022-12-24 DIAGNOSIS — K5090 Crohn's disease, unspecified, without complications: Secondary | ICD-10-CM | POA: Diagnosis not present

## 2022-12-24 DIAGNOSIS — E876 Hypokalemia: Secondary | ICD-10-CM | POA: Insufficient documentation

## 2022-12-24 DIAGNOSIS — K50919 Crohn's disease, unspecified, with unspecified complications: Secondary | ICD-10-CM

## 2022-12-24 DIAGNOSIS — I5022 Chronic systolic (congestive) heart failure: Secondary | ICD-10-CM | POA: Diagnosis not present

## 2022-12-24 DIAGNOSIS — K50119 Crohn's disease of large intestine with unspecified complications: Secondary | ICD-10-CM

## 2022-12-24 DIAGNOSIS — I2510 Atherosclerotic heart disease of native coronary artery without angina pectoris: Secondary | ICD-10-CM | POA: Diagnosis not present

## 2022-12-24 DIAGNOSIS — Z87891 Personal history of nicotine dependence: Secondary | ICD-10-CM | POA: Diagnosis not present

## 2022-12-24 DIAGNOSIS — D751 Secondary polycythemia: Secondary | ICD-10-CM | POA: Diagnosis not present

## 2022-12-24 DIAGNOSIS — I5020 Unspecified systolic (congestive) heart failure: Secondary | ICD-10-CM | POA: Diagnosis present

## 2022-12-24 DIAGNOSIS — I255 Ischemic cardiomyopathy: Secondary | ICD-10-CM | POA: Diagnosis not present

## 2022-12-24 DIAGNOSIS — I714 Abdominal aortic aneurysm, without rupture, unspecified: Secondary | ICD-10-CM | POA: Diagnosis not present

## 2022-12-24 DIAGNOSIS — R002 Palpitations: Secondary | ICD-10-CM | POA: Insufficient documentation

## 2022-12-24 DIAGNOSIS — Z7901 Long term (current) use of anticoagulants: Secondary | ICD-10-CM | POA: Insufficient documentation

## 2022-12-24 DIAGNOSIS — G4733 Obstructive sleep apnea (adult) (pediatric): Secondary | ICD-10-CM | POA: Diagnosis not present

## 2022-12-24 LAB — BRAIN NATRIURETIC PEPTIDE: B Natriuretic Peptide: 43.1 pg/mL (ref 0.0–100.0)

## 2022-12-24 LAB — BASIC METABOLIC PANEL
Anion gap: 9 (ref 5–15)
BUN: 17 mg/dL (ref 6–20)
CO2: 32 mmol/L (ref 22–32)
Calcium: 8.8 mg/dL — ABNORMAL LOW (ref 8.9–10.3)
Chloride: 95 mmol/L — ABNORMAL LOW (ref 98–111)
Creatinine, Ser: 0.94 mg/dL (ref 0.44–1.00)
GFR, Estimated: >60 mL/min (ref >60)
Glucose, Bld: 100 mg/dL — ABNORMAL HIGH (ref 70–99)
Potassium: 3 mmol/L — ABNORMAL LOW (ref 3.5–5.1)
Sodium: 136 mmol/L (ref 135–145)

## 2022-12-24 MED ORDER — POTASSIUM CHLORIDE CRYS ER 20 MEQ PO TBCR
60.0000 meq | EXTENDED_RELEASE_TABLET | Freq: Two times a day (BID) | ORAL | 5 refills | Status: DC
  Filled 2022-12-24: qty 180, 30d supply, fill #0

## 2022-12-24 MED ORDER — IVABRADINE HCL 5 MG PO TABS
2.5000 mg | ORAL_TABLET | Freq: Two times a day (BID) | ORAL | 5 refills | Status: DC
  Filled 2022-12-24 – 2023-01-14 (×2): qty 30, 30d supply, fill #0
  Filled 2023-02-01 – 2023-02-06 (×4): qty 30, 30d supply, fill #1
  Filled 2023-03-08: qty 30, 30d supply, fill #2
  Filled 2023-04-02: qty 30, 30d supply, fill #3
  Filled 2023-04-23 – 2023-04-30 (×2): qty 30, 30d supply, fill #4
  Filled 2023-05-27: qty 30, 30d supply, fill #5

## 2022-12-24 NOTE — Telephone Encounter (Signed)
Spoke with patient regarding the following results. Patient made aware and patient verbalized understanding.   Patient aware of all instructions and is scheduled for repeat BMET on Thursday at 215- patient aware of apt time and date.   Advised patient to call back to office with any issues, questions, or concerns. Patient verbalized understanding.

## 2022-12-24 NOTE — Patient Instructions (Signed)
Medication Changes:  INCREASE POTASSIUM TO (3) TABLETS TWICE DAILY   DECREASE CORLANOR TO 2.5MG  TWICE DAILY   Lab Work:  Labs done today, your results will be available in MyChart, we will contact you for abnormal readings.  Follow-Up in: 4-6 WEEKS WITH APP AS SCHEDULED  At the [facility], you and your health needs are our priority. We have a designated team specialized in the treatment of Heart Failure. This Care Team includes your primary Heart Failure Specialized Cardiologist (physician), Advanced Practice Providers (APPs- Physician Assistants and Nurse Practitioners), and Pharmacist who all work together to provide you with the care you need, when you need it.   You may see any of the following providers on your designated Care Team at your next follow up:  Dr. Arvilla Meres Dr. Marca Ancona Dr. Marcos Eke, NP Robbie Lis, [place] [facility] [place], [place] Brynda Peon, NP Karle Plumber, PharmD   Please be sure to bring in all your medications bottles to every appointment.   Need to Contact us:  If you have any questions or concerns before your next appointment please send [place] a message through [place] or call our office at [phone].    TO LEAVE A MESSAGE FOR THE NURSE SELECT OPTION 2, PLEASE LEAVE A MESSAGE INCLUDING: YOUR NAME DATE OF BIRTH CALL BACK NUMBER REASON FOR CALL**this is important as we prioritize the call backs  YOU WILL RECEIVE A CALL BACK THE SAME DAY AS LONG AS YOU CALL BEFORE 4:00 PM

## 2022-12-24 NOTE — Telephone Encounter (Signed)
Lab order in epic.  MyChart message sent to patient with lab reminder.

## 2022-12-24 NOTE — Telephone Encounter (Signed)
-----   Message from Nurse [place] P sent at 11/19/2022  4:27 PM EDT ----- Regarding: Thompson Grayer labs this week Entyvio (vedolizumab) trough level and antibody 1 week prior to the next infusion - needs lab on 12/27/22 - need to enter order

## 2022-12-24 NOTE — Telephone Encounter (Signed)
-----   Message from Alen Bleacher sent at 12/24/2022  4:27 PM EDT ----- Potassium low. Take additional 60 mEq today and tomorrow on top of 60 mEq BID. Repeat BMET Thursday/Friday

## 2022-12-25 ENCOUNTER — Encounter: Payer: Self-pay | Admitting: Gastroenterology

## 2022-12-26 ENCOUNTER — Encounter (HOSPITAL_COMMUNITY): Payer: Medicaid Other

## 2022-12-26 ENCOUNTER — Telehealth: Payer: Self-pay

## 2022-12-26 ENCOUNTER — Other Ambulatory Visit: Payer: Medicaid Other

## 2022-12-26 ENCOUNTER — Other Ambulatory Visit (HOSPITAL_COMMUNITY): Payer: Self-pay

## 2022-12-26 NOTE — Patient Outreach (Signed)
Care Management  Transitions of Care Program Managed Medicaid Transitions of Care week 3   12/26/2022 Name: Christine [name] MRN: [id number] DOB: [birth year]  Subjective: Christine [name] is a 57 y.o. year old female who is a primary care patient of Marcine Matar, MD. The Care Management team Engaged with patient Engaged with patient by telephone to assess and address transitions of care needs.   Consent to Services:  Patient was given information about Managed Medicaid Care Management services, agreed to services, and gave verbal consent to participate.   Assessment:           SDOH Interventions    Flowsheet Row Telephone from 12/13/2022 in [place] POPULATION HEALTH DEPARTMENT Telephone from 10/01/2022 in [facility] Office Visit from 03/20/2022 in [facility] from 01/31/2021 in [facility]  SDOH Interventions      Food Insecurity Interventions -- Assist with SNAP Application -- Intervention Not Indicated  Housing Interventions -- -- -- Intervention Not Indicated  Transportation Interventions -- -- -- Intervention Not Indicated  Depression Interventions/Treatment  Medication, Currently on Treatment -- Currently on Treatment --  Financial Strain Interventions -- -- -- Artist        Goals Addressed             This Visit's Progress    Transition of Care       Current Barriers:  Chronic Disease Management support and education needs related to CHF  RNCM Clinical Goal(s):  Patient will verbalize understanding of plan for management of CHF as evidenced by patient explaining her daily steps to managing her CHF, such as weighing self daily, taking meds as prescribed, reporting worsening symptoms of fluid retention, difficulty breathing immediately to her MD take all medications exactly as prescribed and will call  provider for medication related questions as evidenced by patient voicing her active participation in her well-being and expressing understanding of what signs and symptoms of worsening Congestive Heart Failure need to be immediately reported to her MD    demonstrate understanding of rationale for each prescribed medication as evidenced by describing the actions of her various meds such as diuretics, and cardiac meds to manage her CHF. As of 8/28 is keeping log of daily weights and signs & symptoms of increasing fluid retention. As of 9/4, patient states she has only 6-7 pounds to go to be within normal range of her fluid-volume weight - she has slowly and consistently been losing fluid weight since being placed back on Lasix 40mg  twice daily; As of 9/11 patient states she has only 3.5 pounds to go to get to her optimal 'dry' weight    attend all scheduled medical appointments: will report adherence as evidenced by appt completion documentation in EMR. Patient has scheduled lab draw 9/12 to check potassium level as patient discharged with a 4.6 KCL and potassium has slowly decreased ever since. Completed scheduled appt with NP at Dr. Prescott Gum  office on 9/9, NP increased daily potassium to twice daily as current potassium hovering around 3, per notes of NP. Patient's goal is to stabilize her potassium level, preferably mid 4s.        continue to work with Medical illustrator and/or Social Worker to address care management and care coordination needs related to CHF as evidenced by adherence to CM Team Scheduled appointments     through collaboration with Medical illustrator,  Alyse Low, PCP Jonah Blue and care team, and Cardiology team of Dr. Prescott Gum.  Interventions: Evaluation of current treatment plan related to self management and patient's adherence to plan as established by provider   Patient Goals/Self-Care Activities: Take medications as prescribed   Attend all scheduled provider  appointments Call pharmacy for medication refills 3-7 days in advance of running out of medications Call provider office for new concerns or questions  call office if I gain more than 2 pounds in one day or 5 pounds in one week keep legs up while sitting track weight in diary use salt in moderation watch for swelling in feet, ankles and legs every day weigh myself daily begin a heart failure diary bring diary to all appointments develop a rescue plan follow rescue plan if symptoms flare-up know when to call the doctor: worsening signs and symptoms of fluid retention, difficulty breathing warrant immediate call to MD track symptoms and what helps feel better or worse           Plan: The patient has been provided with contact information for the care management team and has been advised to call with any health related questions or concerns.   Alyse Low, RN, BA, [facility], CRRN [facility] Coordinator, Transition of Care Ph # [phone]

## 2022-12-26 NOTE — Patient Instructions (Signed)
Visit Information  Dear Christine [name],  Thank you for taking time to visit with me today. As always, I am impressed with your proactive management of your chronic health issues, especially your Congestive heart Failure, Chronic Hypokalmia, and Crohn's. Just 3.5 pounds to go until you reach your optimal "dry' weight!!  Your comments regarding your recent appointment with Brynda Peon, Nurse Practitioner with Dr. Prescott Gum office lead me to believe you have a found an excellent practitioner who listened to you when you described your struggles with keeping your potassium level optimal, and went ahead & increased your twice daily dose of KCL. Hope your 9/12 potassium level lands in the 4s! Please don't hesitate to contact me if I can be of assistance to you before our next scheduled telephone appointment.  Warm Regards,  Elnita Maxwell  Our next appointment is by telephone on Sept 18 at 10:30 am  Following is a copy of your care plan:   Goals Addressed             This Visit's Progress    Transition of Care       Current Barriers:  Chronic Disease Management support and education needs related to CHF  RNCM Clinical Goal(s):  Patient will verbalize understanding of plan for management of CHF as evidenced by patient explaining her daily steps to managing her CHF, such as weighing self daily, taking meds as prescribed, reporting worsening symptoms of fluid retention, difficulty breathing immediately to her MD take all medications exactly as prescribed and will call provider for medication related questions as evidenced by patient voicing her active participation in her well-being and expressing understanding of what signs and symptoms of worsening Congestive Heart Failure need to be immediately reported to her MD    demonstrate understanding of rationale for each prescribed medication as evidenced by describing the actions of her various meds such as diuretics, and cardiac meds to manage her CHF. As of 8/28 is  keeping log of daily weights and signs & symptoms of increasing fluid retention. As of 9/4, patient states she has only 6-7 pounds to go to be within normal range of her fluid-volume weight - she has slowly and consistently been losing fluid weight since being placed back on Lasix 40mg  twice daily; As of 9/11 patient states she has only 3.5 pounds to go to get to her optimal 'dry' weight    attend all scheduled medical appointments: will report adherence as evidenced by appt completion documentation in EMR. Patient has scheduled lab draw 9/12 to check potassium level as patient discharged with a 4.6 KCL and potassium has slowly decreased ever since. Completed scheduled appt with NP at Dr. Prescott Gum  office on 9/9, NP increased daily potassium to twice daily as current potassium hovering around 3, per notes of NP. Patient's goal is to stabilize her potassium level, preferably mid 4s.        continue to work with Medical illustrator and/or Social Worker to address care management and care coordination needs related to CHF as evidenced by adherence to CM Team Scheduled appointments     through collaboration with RN Care Manager, Alyse Low, PCP Jonah Blue and care team, and Cardiology team of Dr. Prescott Gum.  Interventions: Evaluation of current treatment plan related to self management and patient's adherence to plan as established by provider   Patient Goals/Self-Care Activities: Take medications as prescribed   Attend all scheduled provider appointments Call pharmacy for medication refills 3-7 days in advance of running out of  medications Call provider office for new concerns or questions  call office if I gain more than 2 pounds in one day or 5 pounds in one week keep legs up while sitting track weight in diary use salt in moderation watch for swelling in feet, ankles and legs every day weigh myself daily begin a heart failure diary bring diary to all appointments develop a rescue  plan follow rescue plan if symptoms flare-up know when to call the doctor: worsening signs and symptoms of fluid retention, difficulty breathing warrant immediate call to MD track symptoms and what helps feel better or worse           Patient verbalizes understanding of instructions and care plan provided today and agrees to view in MyChart. Active MyChart status and patient understanding of how to access instructions and care plan via MyChart confirmed with patient.     The patient has been provided with contact information for the care management team and has been advised to call with any health related questions or concerns.   Please call the care guide team at [phone] if you need to cancel or reschedule your appointment.   Please call 1-800-273-TALK (toll free, 24 hour hotline) if you are experiencing a Mental Health or Behavioral Health Crisis or need someone to talk to.  Alyse Low, RN, BA, [facility], CRRN [facility], [facility] Coordinator, Transition of Care Ph # [phone]

## 2022-12-27 ENCOUNTER — Other Ambulatory Visit: Payer: Medicaid Other

## 2022-12-27 ENCOUNTER — Ambulatory Visit (HOSPITAL_COMMUNITY)
Admission: RE | Admit: 2022-12-27 | Discharge: 2022-12-27 | Disposition: A | Discharge status: Home / Self Care | Payer: Medicaid Other | Source: Ambulatory Visit | Attending: Internal Medicine | Admitting: Internal Medicine

## 2022-12-27 DIAGNOSIS — E876 Hypokalemia: Secondary | ICD-10-CM | POA: Diagnosis not present

## 2022-12-27 DIAGNOSIS — K50119 Crohn's disease of large intestine with unspecified complications: Secondary | ICD-10-CM

## 2022-12-27 LAB — BASIC METABOLIC PANEL
Anion gap: 11 (ref 5–15)
BUN: 14 mg/dL (ref 6–20)
CO2: 28 mmol/L (ref 22–32)
Calcium: 9.1 mg/dL (ref 8.9–10.3)
Chloride: 97 mmol/L — ABNORMAL LOW (ref 98–111)
Creatinine, Ser: 1.08 mg/dL — ABNORMAL HIGH (ref 0.44–1.00)
GFR, Estimated: >60 mL/min (ref >60)
Glucose, Bld: 142 mg/dL — ABNORMAL HIGH (ref 70–99)
Potassium: 3.4 mmol/L — ABNORMAL LOW (ref 3.5–5.1)
Sodium: 136 mmol/L (ref 135–145)

## 2022-12-31 ENCOUNTER — Other Ambulatory Visit: Payer: Self-pay | Admitting: Internal Medicine

## 2022-12-31 ENCOUNTER — Ambulatory Visit: Payer: Medicaid Other | Attending: Internal Medicine

## 2022-12-31 DIAGNOSIS — I5022 Chronic systolic (congestive) heart failure: Secondary | ICD-10-CM

## 2022-12-31 DIAGNOSIS — E119 Type 2 diabetes mellitus without complications: Secondary | ICD-10-CM

## 2022-12-31 DIAGNOSIS — Z95810 Presence of automatic (implantable) cardiac defibrillator: Secondary | ICD-10-CM | POA: Diagnosis not present

## 2023-01-01 ENCOUNTER — Telehealth (HOSPITAL_COMMUNITY): Payer: Self-pay | Admitting: Cardiology

## 2023-01-01 ENCOUNTER — Other Ambulatory Visit (HOSPITAL_COMMUNITY): Payer: Self-pay

## 2023-01-01 DIAGNOSIS — I5022 Chronic systolic (congestive) heart failure: Secondary | ICD-10-CM

## 2023-01-01 MED ORDER — SITAGLIPTIN PHOSPHATE 50 MG PO TABS
50.0000 mg | ORAL_TABLET | Freq: Every day | ORAL | 1 refills | Status: DC
  Filled 2023-01-01: qty 30, 30d supply, fill #0
  Filled 2023-01-27: qty 30, 30d supply, fill #1

## 2023-01-01 MED ORDER — POTASSIUM CHLORIDE CRYS ER 20 MEQ PO TBCR
80.0000 meq | EXTENDED_RELEASE_TABLET | Freq: Two times a day (BID) | ORAL | 5 refills | Status: DC
  Filled 2023-01-01 – 2023-02-19 (×2): qty 240, 30d supply, fill #0

## 2023-01-01 NOTE — Progress Notes (Signed)
EPIC Encounter for ICM Monitoring  Patient Name: Christine [name] is a 57 y.o. female Date: 01/01/2023 Primary Care Physican: Marcine Matar, MD Primary Cardiologist: [place] Electrophysiologist: Graciela Husbands Last Weight: 185.5 lbs 12/19/2022 Weight: 179 lbs (baseline 177 lbs)       1st ICM Remote Transmission.  Heart Failure questions reviewed, pt feels like fluid accumulation has resolved and weight is almost at baseline.  She is still about 2 lbs above baseline.   HeartLogic Heart Failure Index is 6 suggesting fluid levels are within normal threshold range.   Prescribed:  Furosemide 20 mg take 2 tablets (40 mg total) by mouth two times a day Potassium 20 mEq take 4 tablets (80 mEq total) by mouth two times a day Spironolactone 25 mg take 1 tablet by mouth daily.   Labs: 01/11/2023 BMET scheduled at HF clinic 12/27/2022 Creatinine 1.08, BUN 14, Potassium 3.4, Sodium 136, GFR >60 (low potassium addressed by HF clinic) 12/24/2022 Creatinine 0.94, BUN 17, Potassium 3.0, Sodium 136, GFR >60  12/19/2022 Creatinine 0.87, BUN 12, Potassium 3.6, Sodium 140, GFR >60  12/17/2022 Creatinine 1.01, BUN 17, Potassium 3.7, Sodium 137, GFR >60 12/15/2022 Creatinine 0.92, BUN 11, Potassium 3.9, Sodium 135, GFR >60  12/11/2022 Creatinine 0.81, BUN 12, Potassium 4.6, Sodium 139, GFR >60  12/09/2022 Creatinine 0.94, BUN 18, Potassium 4.8, Sodium 136, GFR >60  12/08/2022 Creatinine 1.18, BUN 22, Potassium 4.3, Sodium 133, GFR 55  12/07/2022 Creatinine 1.31, BUN 32, Potassium 2.3, Sodium 133, GFR 48  A complete set of results can be found in Results Review.  Recommendations:   Encouraged to call for fluid symptoms.  Follow-up plan: ICM clinic phone appointment on 02/04/2023.   91 day device clinic remote transmission 01/17/2023.             EP/Cardiology next office visit: 01/21/2023 with HF clinic         Copy of ICM check sent to Dr. Graciela Husbands.  3 Month HeartLogicT Heart Failure Index:    8  Day Data Trend:          Karie Soda, RN 01/01/2023 4:07 PM

## 2023-01-01 NOTE — Telephone Encounter (Signed)
Pt aware.

## 2023-01-01 NOTE — Addendum Note (Signed)
Addended by: Theresia Bough on: 01/01/2023 03:53 PM   Modules accepted: Orders

## 2023-01-01 NOTE — Telephone Encounter (Signed)
Requested Prescriptions  Pending Prescriptions Disp Refills   sitaGLIPtin (JANUVIA) 50 MG tablet 30 tablet 1    Sig: Take 1 tablet (50 mg total) by mouth daily.     Endocrinology:  Diabetes - DPP-4 Inhibitors Failed - 12/31/2022 12:14 PM      Failed - Cr in normal range and within 360 days    Creatinine  Date Value Ref Range Status  12/25/2021 0.90 0.44 - 1.00 mg/dL Final   Creatinine, Ser  Date Value Ref Range Status  12/27/2022 1.08 (H) 0.44 - 1.00 mg/dL Final   Creatinine, Urine  Date Value Ref Range Status  12/08/2022 48 mg/dL Final         Passed - HBA1C is between 0 and 7.9 and within 180 days    HbA1c, POC (controlled diabetic range)  Date Value Ref Range Status  11/02/2022 7.0 0.0 - 7.0 % Final   Hgb A1c MFr Bld  Date Value Ref Range Status  12/08/2022 7.1 (H) 4.8 - 5.6 % Final    Comment:    (NOTE) Pre diabetes:          5.7%-6.4%  Diabetes:              >6.4%  Glycemic control for   <7.0% adults with diabetes          Passed - Valid encounter within last 6 months    Recent Outpatient Visits           2 months ago Injury of great toenail   [place] [facility] Jonah Blue B, MD   3 months ago Coronary artery disease involving native coronary artery of native heart without angina pectoris   [place] 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   In 2 weeks Marcine Matar, MD [facility]

## 2023-01-01 NOTE — Telephone Encounter (Signed)
Patient called to report symptoms of low potassium -HA -cramps in hip and feet -palps at times  Aware k was low a few weeks ago wanted to be sure additional supplements or labs are not needed

## 2023-01-02 ENCOUNTER — Telehealth: Payer: Self-pay

## 2023-01-02 ENCOUNTER — Other Ambulatory Visit (HOSPITAL_COMMUNITY): Payer: Self-pay

## 2023-01-02 ENCOUNTER — Other Ambulatory Visit: Payer: Self-pay | Admitting: Gastroenterology

## 2023-01-02 ENCOUNTER — Encounter (HOSPITAL_COMMUNITY): Payer: Self-pay

## 2023-01-02 ENCOUNTER — Other Ambulatory Visit: Payer: Medicaid Other

## 2023-01-02 NOTE — Telephone Encounter (Signed)
Shanda Bumps, I am no longer Dr. Frankey Shown nurse. Routing to [employer], Charity fundraiser.   Dottie, based on patient message from pharmacy it says Lomotil was denied, not Entyvio? Not sure. Thanks

## 2023-01-02 NOTE — Patient Outreach (Signed)
Care Management  Transitions of Care Program Managed Medicaid Transitions of Care week 4 Final telephone appt of 30day program   01/02/2023 Name: Christine [name] MRN: [id number] DOB: 1966-03-23  Subjective: Christine [name] is a 57 y.o. year old female who is a primary care patient of Marcine Matar, MD. The Care Management team Engaged with patient Engaged with patient by telephone to assess and address transitions of care needs.   Consent to Services:  Patient was given information about Managed Medicaid Care Management services, agreed to services, and gave verbal consent to participate.   Assessment:           SDOH Interventions    Flowsheet Row Telephone from 12/13/2022 in [place] [facility] DEPARTMENT Telephone from 10/01/2022 in [facility] Office Visit from 03/20/2022 in [place] [facility] from 01/31/2021 in [facility]  SDOH Interventions      Food Insecurity Interventions -- Assist with SNAP Application -- Intervention Not Indicated  Housing Interventions -- -- -- Intervention Not Indicated  Transportation Interventions -- -- -- Intervention Not Indicated  Depression Interventions/Treatment  Medication, Currently on Treatment -- Currently on Treatment --  Financial Strain Interventions -- -- -- Artist        Goals Addressed             This Visit's Progress    COMPLETED: Transition of Care       Current Barriers:  Chronic Disease Management support and education needs related to CHF  RNCM Clinical Goal(s):  Patient will verbalize understanding of plan for management of CHF as evidenced by patient explaining her daily steps to managing her CHF, such as weighing self daily, taking meds as prescribed, reporting worsening symptoms of fluid retention, difficulty breathing immediately to her MD take all  medications exactly as prescribed and will call provider for medication related questions as evidenced by patient voicing her active participation in her well-being and expressing understanding of what signs and symptoms of worsening Congestive Heart Failure need to be immediately reported to her MD    demonstrate understanding of rationale for each prescribed medication as evidenced by describing the actions of her various meds such as diuretics, and cardiac meds to manage her CHF. As of 8/28 is keeping log of daily weights and signs & symptoms of increasing fluid retention. As of 9/4, patient states she has only 6-7 pounds to go to be within normal range of her fluid-volume weight - has slowly and consistently been losing fluid weight since being placed back on Lasix 40mg  twice daily; As of 9/11 patient states she has only 3.5 pounds to go to get to her optimal "dry" weight; As of 9/18 and during final telephone visit with [facility], patient '[name]" reported that she is within 2 pounds of her optimal "dry" weight of 177 pounds.    attend all scheduled medical appointments: will report adherence as evidenced by appt completion documentation in EMR. Patient had scheduled lab draw 9/12 to check potassium level as patient discharged with a 4.6 KCL and potassium had slowly decreased ever since. Completed scheduled appt with NP at Dr. Prescott Gum  office on 9/9, NP increased daily potassium to twice daily as current potassium hovering around 3, per notes of NP. Patient's goal is to stabilize her potassium level, preferably mid 4s. Patient "[name]" is now capable of recognizing her personal unique signs of potassium level  dropping, especially in the context of her Crohn's Disease flare-ups - she is now comfortable with calling her MD immediately if she experiences feet cramping, hip cramping, and increased shortness of breath which she recognizes as reliable signs that her potassium is no longer within desired level.         continue to work with Medical illustrator and/or Social Worker to address care management and care coordination needs related to CHF as evidenced by adherence to CM Team Scheduled appointments     through collaboration with RN Care Manager, Alyse Low, PCP Jonah Blue and care team, and Cardiology team of Dr. Prescott Gum.  Interventions: Evaluation of current treatment plan related to self management and patient's adherence to plan as established by provider   Patient Goals/Self-Care Activities: Take medications as prescribed   Attend all scheduled provider appointments Call pharmacy for medication refills 3-7 days in advance of running out of medications Call provider office for new concerns or questions  call office if I gain more than 2 pounds in one day or 5 pounds in one week keep legs up while sitting track weight in diary use salt in moderation watch for swelling in feet, ankles and legs every day weigh myself daily begin a heart failure diary bring diary to all appointments develop a rescue plan follow rescue plan if symptoms flare-up know when to call the doctor: worsening signs and symptoms of fluid retention, difficulty breathing warrant immediate call to MD track symptoms and what helps feel better or worse           Plan: The patient has been provided with contact information for the care management team and has been advised to call with any health related questions or concerns.   Alyse Low, RN, BA, [facility], CRRN [facility] Coordinator, Transition of Care Ph # [phone]

## 2023-01-02 NOTE — Telephone Encounter (Signed)
Dr Barron Alvine-  Patient is under the impression that you needed to see her in the office again in October/early November time frame for follow up. I did put her tentatively on your schedule for 03/29/23, but wanted to check with you...   On your last office note 09/2022, it looks like you wanted patient to have colonoscopy in 6-12 months with follow up in office 6-12 months as well. She was to have labs completed (fecal cal, vedolizumab trough/AB) prior to October Entyvio dose, but this was expedited due to hospitalizations etc.  Is there any reason you would need to see her prior to the 03/2023 scheduled visit if she is doing well?

## 2023-01-02 NOTE — Telephone Encounter (Signed)
Patient called stated she was denied for the [facility] medication and would like to know why. Please advise. Thanks

## 2023-01-02 NOTE — Patient Instructions (Signed)
Visit Information  Dear Christine [name],  Thank you for taking time to engage with me today during our final telephone visit and graduation, of sorts, from our Transition of Care program.    You have accomplished so much in terms of taking control and managing your chronic health conditions since your recent hospitalization 8/23 thru 8/25. You are your STRONGEST health advocate and I am so excited to hear that you won't hesitate in contacting Dr. Prescott Gum team if you are experiencing cardiac symptoms that you recognize are signs/symptoms of increased fluid retention or dropping potassium levels.   Your commitment to managing & monitoring your health closely is very impressive and reduces your risk for inpatient re-hospitalization greatly.  Please don't hesitate to contact me if I can be of assistance to you in the future at [phone].  Warm Regards & Thoughts of Wellness,  Elnita Maxwell   Following is a copy of your care plan:   Goals Addressed             This Visit's Progress    COMPLETED: Transition of Care       Current Barriers:  Chronic Disease Management support and education needs related to CHF  RNCM Clinical Goal(s):  Patient will verbalize understanding of plan for management of CHF as evidenced by patient explaining her daily steps to managing her CHF, such as weighing self daily, taking meds as prescribed, reporting worsening symptoms of fluid retention, difficulty breathing immediately to her MD take all medications exactly as prescribed and will call provider for medication related questions as evidenced by patient voicing her active participation in her well-being and expressing understanding of what signs and symptoms of worsening Congestive Heart Failure need to be immediately reported to her MD    demonstrate understanding of rationale for each prescribed medication as evidenced by describing the actions of her various meds such as diuretics, and cardiac meds to manage her CHF.  As of 8/28 is keeping log of daily weights and signs & symptoms of increasing fluid retention. As of 9/4, patient states she has only 6-7 pounds to go to be within normal range of her fluid-volume weight - has slowly and consistently been losing fluid weight since being placed back on Lasix 40mg  twice daily; As of 9/11 patient states she has only 3.5 pounds to go to get to her optimal "dry" weight; As of 9/18 and during final telephone visit with [facility], patient 'Christine [name]" reported that she is within 2 pounds of her optimal "dry" weight of 177 pounds.    attend all scheduled medical appointments: will report adherence as evidenced by appt completion documentation in EMR. Patient had scheduled lab draw 9/12 to check potassium level as patient discharged with a 4.6 KCL and potassium had slowly decreased ever since. Completed scheduled appt with NP at Dr. Prescott Gum  office on 9/9, NP increased daily potassium to twice daily as current potassium hovering around 3, per notes of NP. Patient's goal is to stabilize her potassium level, preferably mid 4s. Patient "Christine [name]" is now capable of recognizing her personal unique signs of potassium level dropping, especially in the context of her Crohn's Disease flare-ups - she is now comfortable with calling her MD immediately if she experiences feet cramping, hip cramping, and increased shortness of breath which she recognizes as reliable signs that her potassium is no longer within desired level.        continue to work with Medical illustrator and/or Social Worker to address care management  and care coordination needs related to CHF as evidenced by adherence to CM Team Scheduled appointments     through collaboration with RN Care Manager, Alyse Low, PCP Jonah Blue and care team, and Cardiology team of Dr. Prescott Gum.  Interventions: Evaluation of current treatment plan related to self management and patient's adherence to plan as established by provider   Patient  Goals/Self-Care Activities: Take medications as prescribed   Attend all scheduled provider appointments Call pharmacy for medication refills 3-7 days in advance of running out of medications Call provider office for new concerns or questions  call office if I gain more than 2 pounds in one day or 5 pounds in one week keep legs up while sitting track weight in diary use salt in moderation watch for swelling in feet, ankles and legs every day weigh myself daily begin a heart failure diary bring diary to all appointments develop a rescue plan follow rescue plan if symptoms flare-up know when to call the doctor: worsening signs and symptoms of fluid retention, difficulty breathing warrant immediate call to MD track symptoms and what helps feel better or worse           Patient verbalizes understanding of instructions and care plan provided today and agrees to view in MyChart. Active MyChart status and patient understanding of how to access instructions and care plan via MyChart confirmed with patient.     The patient has been provided with contact information for the care management team and has been advised to call with any health related questions or concerns.   Please call the care guide team at [phone] if you need to cancel or reschedule your appointment.   Please call 1-800-273-TALK (toll free, 24 hour hotline) if you are experiencing a Mental Health or Behavioral Health Crisis or need someone to talk to.  Alyse Low, RN, BA, [facility], CRRN [facility] Coordinator, Transition of Care Ph [phone]

## 2023-01-02 NOTE — Telephone Encounter (Signed)
Spoke to [employer] at [facility]. Patient was hospitalized in August and provider at the hospital discontinued the lomotil. When discontinued in the system, it is also discontinued with the pharmacy. Rosanne Ashing was able to reactivate the prescription. Patient made aware.

## 2023-01-03 ENCOUNTER — Encounter: Payer: Self-pay | Admitting: Internal Medicine

## 2023-01-03 ENCOUNTER — Other Ambulatory Visit (HOSPITAL_COMMUNITY): Payer: Self-pay

## 2023-01-03 ENCOUNTER — Ambulatory Visit (HOSPITAL_COMMUNITY)
Admission: RE | Admit: 2023-01-03 | Discharge: 2023-01-03 | Disposition: A | Discharge status: Home / Self Care | Payer: Medicaid Other | Source: Ambulatory Visit | Attending: Gastroenterology | Admitting: Gastroenterology

## 2023-01-03 DIAGNOSIS — K5090 Crohn's disease, unspecified, without complications: Secondary | ICD-10-CM | POA: Insufficient documentation

## 2023-01-03 MED ORDER — VEDOLIZUMAB 300 MG IV SOLR
300.0000 mg | INTRAVENOUS | Status: DC
  Administered 2023-01-03: 300 mg via INTRAVENOUS
  Filled 2023-01-03: qty 5

## 2023-01-06 LAB — SERIAL MONITORING

## 2023-01-07 ENCOUNTER — Other Ambulatory Visit (HOSPITAL_COMMUNITY): Payer: Self-pay

## 2023-01-08 ENCOUNTER — Telehealth: Payer: Self-pay | Admitting: Internal Medicine

## 2023-01-08 ENCOUNTER — Other Ambulatory Visit (HOSPITAL_COMMUNITY): Payer: Self-pay

## 2023-01-08 DIAGNOSIS — L989 Disorder of the skin and subcutaneous tissue, unspecified: Secondary | ICD-10-CM | POA: Diagnosis not present

## 2023-01-08 LAB — VEDOLIZUMAB AND ANTI-VEDO AB
Anti-Vedolizumab Antibody: <25 ng/mL
Vedolizumab: 12 ug/mL

## 2023-01-08 MED ORDER — TRIAMCINOLONE ACETONIDE 0.1 % EX CREA
1.0000 | TOPICAL_CREAM | Freq: Two times a day (BID) | CUTANEOUS | 1 refills | Status: DC
Start: 2023-01-08 — End: 2023-05-22
  Filled 2023-01-08: qty 80, 10d supply, fill #0
  Filled 2023-03-17: qty 80, 10d supply, fill #1

## 2023-01-09 ENCOUNTER — Other Ambulatory Visit (HOSPITAL_COMMUNITY): Payer: Self-pay

## 2023-01-11 ENCOUNTER — Ambulatory Visit (HOSPITAL_COMMUNITY)
Admission: RE | Admit: 2023-01-11 | Discharge: 2023-01-11 | Disposition: A | Discharge status: Home / Self Care | Payer: Medicaid Other | Source: Ambulatory Visit | Attending: Cardiology | Admitting: Cardiology

## 2023-01-11 ENCOUNTER — Telehealth (HOSPITAL_COMMUNITY): Payer: Self-pay

## 2023-01-11 DIAGNOSIS — I5042 Chronic combined systolic (congestive) and diastolic (congestive) heart failure: Secondary | ICD-10-CM | POA: Diagnosis not present

## 2023-01-11 DIAGNOSIS — I5022 Chronic systolic (congestive) heart failure: Secondary | ICD-10-CM | POA: Diagnosis present

## 2023-01-11 LAB — BASIC METABOLIC PANEL
Anion gap: 14 (ref 5–15)
BUN: 29 mg/dL — ABNORMAL HIGH (ref 6–20)
CO2: 36 mmol/L — ABNORMAL HIGH (ref 22–32)
Calcium: 9.9 mg/dL (ref 8.9–10.3)
Chloride: 83 mmol/L — ABNORMAL LOW (ref 98–111)
Creatinine, Ser: 1.37 mg/dL — ABNORMAL HIGH (ref 0.44–1.00)
GFR, Estimated: 46 mL/min — ABNORMAL LOW (ref >60)
Glucose, Bld: 104 mg/dL — ABNORMAL HIGH (ref 70–99)
Potassium: 2.4 mmol/L — CL (ref 3.5–5.1)
Sodium: 133 mmol/L — ABNORMAL LOW (ref 135–145)

## 2023-01-11 NOTE — Telephone Encounter (Signed)
Patient advised and verbalized understanding,lab appointment scheduled,lab orders entered  Orders Placed This Encounter  Procedures   Basic metabolic panel    Standing Status:   Future    Standing Expiration Date:   01/11/2024    Order Specific Question:   Release to patient    Answer:   Immediate    Order Specific Question:   Release to patient    Answer:   Immediate [1]

## 2023-01-11 NOTE — Telephone Encounter (Signed)
-----   Message from Jacklynn Ganong sent at 01/11/2023  2:23 PM EDT ----- K is very low.  Increase KCL to 80 meQ TID  Repeat K on Monday

## 2023-01-14 ENCOUNTER — Ambulatory Visit: Payer: Self-pay | Admitting: Internal Medicine

## 2023-01-14 ENCOUNTER — Ambulatory Visit (HOSPITAL_COMMUNITY)
Admission: RE | Admit: 2023-01-14 | Discharge: 2023-01-14 | Disposition: A | Discharge status: Home / Self Care | Payer: Medicaid Other | Source: Ambulatory Visit | Attending: Internal Medicine | Admitting: Internal Medicine

## 2023-01-14 ENCOUNTER — Other Ambulatory Visit: Payer: Self-pay

## 2023-01-14 ENCOUNTER — Other Ambulatory Visit (HOSPITAL_COMMUNITY): Payer: Self-pay

## 2023-01-14 DIAGNOSIS — I5022 Chronic systolic (congestive) heart failure: Secondary | ICD-10-CM | POA: Insufficient documentation

## 2023-01-14 LAB — BASIC METABOLIC PANEL
Anion gap: 12 (ref 5–15)
BUN: 26 mg/dL — ABNORMAL HIGH (ref 6–20)
CO2: 36 mmol/L — ABNORMAL HIGH (ref 22–32)
Calcium: 10.1 mg/dL (ref 8.9–10.3)
Chloride: 86 mmol/L — ABNORMAL LOW (ref 98–111)
Creatinine, Ser: 1.24 mg/dL — ABNORMAL HIGH (ref 0.44–1.00)
GFR, Estimated: 51 mL/min — ABNORMAL LOW (ref >60)
Glucose, Bld: 97 mg/dL (ref 70–99)
Potassium: 2.8 mmol/L — ABNORMAL LOW (ref 3.5–5.1)
Sodium: 134 mmol/L — ABNORMAL LOW (ref 135–145)

## 2023-01-15 ENCOUNTER — Other Ambulatory Visit (HOSPITAL_COMMUNITY): Payer: Self-pay

## 2023-01-15 ENCOUNTER — Encounter: Payer: Self-pay | Admitting: Internal Medicine

## 2023-01-15 ENCOUNTER — Telehealth (HOSPITAL_COMMUNITY): Payer: Self-pay

## 2023-01-15 ENCOUNTER — Ambulatory Visit: Payer: Medicaid Other | Attending: Internal Medicine | Admitting: Internal Medicine

## 2023-01-15 VITALS — BP 90/58 | HR 65 | Temp 98.8°F | Ht 70.0 in | Wt 183.0 lb

## 2023-01-15 DIAGNOSIS — Z7984 Long term (current) use of oral hypoglycemic drugs: Secondary | ICD-10-CM | POA: Diagnosis not present

## 2023-01-15 DIAGNOSIS — L309 Dermatitis, unspecified: Secondary | ICD-10-CM

## 2023-01-15 DIAGNOSIS — L299 Pruritus, unspecified: Secondary | ICD-10-CM

## 2023-01-15 DIAGNOSIS — Z23 Encounter for immunization: Secondary | ICD-10-CM | POA: Diagnosis not present

## 2023-01-15 DIAGNOSIS — E1169 Type 2 diabetes mellitus with other specified complication: Secondary | ICD-10-CM

## 2023-01-15 DIAGNOSIS — I5022 Chronic systolic (congestive) heart failure: Secondary | ICD-10-CM | POA: Diagnosis not present

## 2023-01-15 DIAGNOSIS — E876 Hypokalemia: Secondary | ICD-10-CM

## 2023-01-15 DIAGNOSIS — R232 Flushing: Secondary | ICD-10-CM

## 2023-01-15 DIAGNOSIS — Z419 Encounter for procedure for purposes other than remedying health state, unspecified: Secondary | ICD-10-CM | POA: Diagnosis not present

## 2023-01-15 MED ORDER — LORATADINE 10 MG PO TABS
10.0000 mg | ORAL_TABLET | Freq: Every day | ORAL | 1 refills | Status: DC
Start: 2023-01-15 — End: 2023-03-25
  Filled 2023-01-15: qty 30, 30d supply, fill #0
  Filled 2023-02-01 – 2023-02-06 (×4): qty 30, 30d supply, fill #1
  Filled 2023-03-10: qty 30, 30d supply, fill #2

## 2023-01-15 NOTE — Telephone Encounter (Addendum)
Pt aware, agreeable, and verbalized understanding  Has follow up next week.  ----- Message from Jacklynn Ganong sent at 01/15/2023 12:37 PM EDT ----- Please call

## 2023-01-15 NOTE — Progress Notes (Signed)
Patient ID: Christine [name], female    DOB: 1966-01-07  MRN: [id number]  CC: Hypertension (HTN f/u./All over body itching X1 week - worsens at night /Advise on vaccines for flu & shingles - pt cannot have live vx due to [facility] )   Subjective: Christine [name] is a 57 y.o. female who presents for chronic ds management. 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    Low K+:  pt hosp in 11/2022 with symptomatic low potassium.   appt with nephrology scheduled for 02/13/23.   K level yesterday was 2.8.  K supplement increased to 20 meq 4 tabs TID by cardiology; prior to this was 60 meq BID.  Lasix was decreased to 40 mg BID.  Mg levels have been good; not on any PPI  DM: Lab Results  Component Value Date   HGBA1C 7.1 (H) 12/08/2022  -eating better Checks BS every morning.  Range 120-125. Taking and tolerating Januvia  C/o episodes of feeling itchy all over x 1.5 wks Gets hot, then skin gets prickly then she starts itching all over Benadryl helps a little. -I saw her 09/13/22.  She reported having skin lesions on her extremities for the past 2 months.  Gi thinks it may be Erythema nodosum.  Starts as red spots that are itchy; sore when on jt.  About 4 new spots a day.  Little bruising.   no one else in house with rash; no bed bugs, no new body products using OTC Benadryl and Hydrocortisone My assessment was dermatitis that could be a nodosum associated with inflammatory bowel disease versus dermatitis due to [facility] I referred her to dermatology. Saw derm at [facility] in [facility] and had bx of spots that she gets on extremities.  New spots appeared after last Entyvio infusion.  Told bx showed dermatosis; also  being checked for cutaneous lupus. Given Triamcinolone cream 0.1% steroid cream which helps a little.   Hot Flashes: Christine [name] works Adult nurse.   CAD/chronic systolic CHF/LV thrombus/HL:  Reports compliance with atorvastatin 80 mg daily, furosemide 40 mg twice a day, metoprolol 25 mg 1/2 daily,  spironolactone 12.5 mg daily, Farxiga 10 mg daily.  No lower extremity edema.  Patient Active Problem List   Diagnosis Date Noted   Crohn's disease involving terminal ileum (HCC) 07/23/2022   Crohn's disease of large intestine with other complication (HCC) 05/11/2022   Mild major depression (HCC) 05/11/2022   Paroxysmal tachycardia, unspecified (HCC) 05/11/2022   Essential hypertension 03/02/2022   Acute pancreatitis 03/01/2022   Hypokalemia 03/01/2022   Ileitis 03/01/2022   Diarrhea 02/26/2022   Nausea without vomiting 02/26/2022   Loss of weight 02/26/2022   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   Ulcer of ileum 02/26/2022   Diverticulosis of colon without hemorrhage 02/26/2022   Cardiomyopathy (HCC) ischemic and non-ischemic 01/16/2022   Abdominal aortic aneurysm (AAA) 3.0 cm to 5.0 cm in diameter in female [facility]) 01/09/2022   Polycythemia 01/09/2022   Chronic combined systolic and diastolic heart failure (HCC) 08/24/2021   CAD in native artery 08/24/2021   S/P ICD (internal cardiac defibrillator) procedure 08/10/2021   Obstructive sleep apnea 05/05/2021   Former smoker 02/06/2021   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   Obesity (BMI 30.0-34.9) 02/06/2021  Anxiety 01/20/2021   Tobacco abuse 01/20/2021   Shortness of breath 01/20/2021   Chest tightness      Current Outpatient Medications on File Prior to Visit  Medication Sig Dispense Refill   Accu-Chek Softclix Lancets lancets Use as instructed.  Check blood sugar daily before breakfast. 100 each 12   acetaminophen (TYLENOL) 500 MG tablet Take 1,000 mg by mouth 3 (three) times daily as needed for moderate pain or headache.      ALPRAZolam (XANAX) 1 MG tablet Take 2 mg by mouth 3 (three) times daily.     alum & mag hydroxide-simeth (MAALOX/MYLANTA) 200-200-20 MG/5ML suspension Take 30 mLs by mouth every 4 (four) hours as needed for indigestion or heartburn. 355 mL 0   apixaban (ELIQUIS) 5 MG TABS tablet Take 1 tablet (5 mg total) by mouth 2 (two) times daily. 60 tablet 11   atorvastatin (LIPITOR) 80 MG tablet Take 1 tablet (80 mg total) by mouth daily. 90 tablet 3   Blood Glucose Monitoring Suppl (ACCU-CHEK GUIDE) w/Device KIT Check blood sugar once daily before breakfast 1 kit 0   buPROPion (WELLBUTRIN XL) 150 MG 24 hr tablet Take 150 mg by mouth daily.     dapagliflozin propanediol (FARXIGA) 10 MG TABS tablet Take 1 tablet (10 mg total) by mouth daily. 30 tablet 9   diphenhydrAMINE (BENADRYL) 25 MG tablet Take 50 mg by mouth as needed for allergies.     diphenoxylate-atropine (LOMOTIL) 2.5-0.025 MG tablet Take 2 tablets by mouth 4 (four) times daily as needed for diarrhea/loose stools. (Patient taking differently: Take 2 tablets by mouth 4 (four) times daily as needed for diarrhea or loose stools.) 60 tablet 3   diphenoxylate-atropine (LOMOTIL) 2.5-0.025 MG tablet Take 2 tablets by mouth 4 (four) times daily as needed for diarrhea/loose stools. 60 tablet 3   feeding supplement (ENSURE ENLIVE / ENSURE PLUS) LIQD Take 237 mLs by mouth 2 (two) times daily between meals. 237 mL 12   Fezolinetant (VEOZAH) 45 MG TABS Take 1 tablet (45 mg total) by mouth daily. 30 tablet 2   furosemide (LASIX) 20 MG tablet Take 2 tablets (40 mg total) by mouth 2 (two) times daily. 120 tablet 3   glucose blood (ACCU-CHEK GUIDE) test strip Use as instructed to check blood sugar daily before breakfast 100 each 12   ivabradine (CORLANOR) 5 MG TABS tablet Take 0.5 tablets (2.5 mg total) by mouth 2 (two) times daily. 30 tablet 5   lipase/protease/amylase (CREON) 36000 UNITS CPEP capsule Take 2 capsules ( 72000 units) with meals and 1 capsule ( 36000  units) with snack. (Patient taking differently: 36,000 Units with breakfast, with lunch, and with evening meal. Take 1 tablet before breakfast) 300 capsule 5   metoprolol succinate (TOPROL-XL) 25 MG 24 hr tablet Take 1/2 tablet (12.5 mg total) by mouth daily. 90 tablet 0   metroNIDAZOLE (METROGEL) 1 % gel Apply 1 Application topically as needed (rash).     Multiple Vitamins-Minerals (MULTIVITAMIN WITH MINERALS) tablet Take 1 tablet by mouth daily.     oxymetazoline (AFRIN) 0.05 % nasal spray Place 1-2 sprays into both nostrils 2 (two) times daily as needed for congestion.     potassium chloride SA (KLOR-CON M) 20 MEQ tablet Take 4 tablets (80 mEq total) by mouth 2 (two) times daily. (Patient taking differently: Take 80 mEq by mouth 2 (two) times daily. Taking 4 tablets three (3) times a day) 240 tablet 5   prazosin (MINIPRESS) 2 MG capsule Take 2-6 mg  by mouth at bedtime.     promethazine (PHENERGAN) 25 MG tablet Take 1 tablet (25 mg total) by mouth every 6 (six) to 8 (eight) hours for nausea or vomiting. 90 tablet 2   sitaGLIPtin (JANUVIA) 50 MG tablet Take 1 tablet (50 mg total) by mouth daily. 30 tablet 1   spironolactone (ALDACTONE) 25 MG tablet Take 1 tablet (25 mg total) by mouth daily. 90 tablet 3   triamcinolone cream (KENALOG) 0.1 % Apply 1 Application (1 gram) topically 2 (two) times daily. 80 g 1   vedolizumab (ENTYVIO) 300 MG injection Inject 300 mg into the vein every 2 (two) months.     zolpidem (AMBIEN) 10 MG tablet Take 5-10 mg by mouth at bedtime.     albuterol (VENTOLIN HFA) 108 (90 Base) MCG/ACT inhaler Inhale 1-2 puffs into the lungs every 6 (six) hours as needed for wheezing or shortness of breath. (Patient not taking: Reported on 01/15/2023) 18 g 0   No current facility-administered medications on file prior to visit.    Allergies  Allergen Reactions   Gabapentin Diarrhea   Sulfa Antibiotics Hives, Itching and Rash   Erythromycin Hives   Tramadol Rash and Other (See  Comments)    Urinary retention     Ibuprofen Itching   Tape Rash    Social History   Socioeconomic History   Marital status: Single    Spouse name: Not on file   Number of children: 0   Years of education: 16   Highest education level: Bachelor's degree (e.g., BA, AB, BS)  Occupational History   Not on file  Tobacco Use   Smoking status: Former    Current packs/day: 0.00    Types: Cigarettes    Quit date: 01/15/2021    Years since quitting: 2.0   Smokeless tobacco: Never  Vaping Use   Vaping status: Never Used  Substance and Sexual Activity   Alcohol use: Yes    Comment: rarely   Drug use: Never   Sexual activity: Not on file  Other Topics Concern   Not on file  Social History Narrative   Not on file   Social Determinants of Health   Financial Resource Strain: Medium Risk (12/13/2022)   Overall Financial Resource Strain (CARDIA)    Difficulty of Paying Living Expenses: Somewhat hard  Food Insecurity: No Food Insecurity (12/13/2022)   Hunger Vital Sign    Worried About Running Out of Food in the Last Year: Never true    Ran Out of Food in the Last Year: Never true  Transportation Needs: No Transportation Needs (12/13/2022)   PRAPARE - Administrator, Civil Service (Medical): No    Lack of Transportation (Non-Medical): No  Physical Activity: Insufficiently Active (09/12/2022)   Exercise Vital Sign    Days of Exercise per Week: 4 days    Minutes of Exercise per Session: 20 min  Stress: No Stress Concern Present (09/12/2022)   [employer] of Occupational Health - Occupational Stress Questionnaire    Feeling of Stress : Only a little  Social Connections: Moderately Isolated (09/12/2022)   Social Connection and Isolation Panel [NHANES]    Frequency of Communication with Friends and Family: Twice a week    Frequency of Social Gatherings with Friends and Family: Once a week    Attends Religious Services: 1 to 4 times per year    Active Member of [employer]  or Organizations: No    Attends Banker Meetings: Not on file  Marital Status: Never married  Intimate Partner Violence: Unknown (07/21/2021)   Received from [facility]   HITS    Physically Hurt: Not on file    Insult or Talk Down To: Not on file    Threaten Physical Harm: Not on file    Scream or Curse: Not on file    Family History  Problem Relation Age of Onset   Stroke Mother    Atrial fibrillation Mother    Heart failure Father    Stomach cancer Maternal Grandfather    Liver cancer Neg Hx    Esophageal cancer Neg Hx    Colon polyps Neg Hx    Colon cancer Neg Hx     Past Surgical History:  Procedure Laterality Date   ABDOMINAL HYSTERECTOMY     BIOPSY  02/26/2022   Procedure: BIOPSY;  Surgeon: Shellia Cleverly, DO;  Location: [facility];  Service: Gastroenterology;;   CARDIAC CATHETERIZATION     CHOLECYSTECTOMY     COLONOSCOPY WITH PROPOFOL N/A 02/26/2022   Procedure: COLONOSCOPY WITH PROPOFOL;  Surgeon: Shellia Cleverly, DO;  Location: [facility];  Service: Gastroenterology;  Laterality: N/A;   ESOPHAGOGASTRODUODENOSCOPY (EGD) WITH PROPOFOL N/A 02/26/2022   Procedure: ESOPHAGOGASTRODUODENOSCOPY (EGD) WITH PROPOFOL;  Surgeon: Shellia Cleverly, DO;  Location: [facility];  Service: Gastroenterology;  Laterality: N/A;   ICD IMPLANT N/A 07/21/2021   Procedure: ICD IMPLANT;  Surgeon: Duke Salvia, MD;  Location: [facility] INVASIVE CV LAB;  Service: Cardiovascular;  Laterality: N/A;   NECK SURGERY     OVARIAN CYST SURGERY     POLYPECTOMY  02/26/2022   Procedure: POLYPECTOMY;  Surgeon: Shellia Cleverly, DO;  Location: [facility];  Service: Gastroenterology;;   RIGHT/LEFT HEART CATH AND CORONARY ANGIOGRAPHY N/A 01/23/2021   Procedure: RIGHT/LEFT HEART CATH AND CORONARY ANGIOGRAPHY;  Surgeon: Dolores Patty, MD;  Location: [facility] INVASIVE CV LAB;  Service: Cardiovascular;  Laterality: N/A;    ROS: Review of Systems Negative except as  stated above  PHYSICAL EXAM: BP (!) 90/58 (BP Location: Left Arm, Patient Position: Sitting, Cuff Size: Normal)   Pulse 65   Temp 98.8 F (37.1 C) (Oral)   Ht 5\' 10"  (1.778 m)   Wt 183 lb (83 kg)   SpO2 95%   BMI 26.26 kg/m   Physical Exam  General appearance - alert, well appearing, and in no distress Mental status - normal mood, behavior, speech, dress, motor activity, and thought processes Neck - supple, no significant adenopathy Chest - clear to auscultation, no wheezes, rales or rhonchi, symmetric air entry Heart - normal rate, regular rhythm, normal S1, S2, no murmurs, rubs, clicks or gallops Extremities - peripheral pulses normal, no pedal edema, no clubbing or cyanosis Skin: Still has fine small scattered erythematous circular macular lesions on the extremities     Latest Ref Rng & Units 01/14/2023    2:28 PM 01/11/2023   12:33 PM 12/27/2022    1:51 PM  CMP  Glucose 70 - 99 mg/dL 97  409  811   BUN 6 - 20 mg/dL 26  29  14    Creatinine 0.44 - 1.00 mg/dL 9.14  7.82  9.56   Sodium 135 - 145 mmol/L 134  133  136   Potassium 3.5 - 5.1 mmol/L 2.8  2.4  3.4   Chloride 98 - 111 mmol/L 86  83  97   CO2 22 - 32 mmol/L 36  36  28   Calcium 8.9 - 10.3 mg/dL  10.1  9.9  9.1    Lipid Panel     Component Value Date/Time   CHOL 185 03/01/2022 0339   TRIG 221 (H) 03/01/2022 0339   HDL 36 (L) 03/01/2022 0339   CHOLHDL 5.1 03/01/2022 0339   VLDL 44 (H) 03/01/2022 0339   LDLCALC 105 (H) 03/01/2022 0339    CBC    Component Value Date/Time   WBC 9.0 12/17/2022 1552   RBC 4.61 12/17/2022 1552   HGB 13.4 12/17/2022 1552   HGB 14.9 12/25/2021 1119   HGB 17.5 (H) 08/10/2021 1023   HCT 40.2 12/17/2022 1552   HCT 50.7 (H) 08/10/2021 1023   PLT 303 12/17/2022 1552   PLT 326 12/25/2021 1119   PLT 374 08/10/2021 1023   MCV 87.2 12/17/2022 1552   MCV 86 08/10/2021 1023   MCH 29.1 12/17/2022 1552   MCHC 33.3 12/17/2022 1552   RDW 13.8 12/17/2022 1552   RDW 13.1 08/10/2021 1023    LYMPHSABS 1.0 12/15/2022 1402   LYMPHSABS 1.8 08/10/2021 1023   MONOABS 1.1 (H) 12/15/2022 1402   EOSABS 0.2 12/15/2022 1402   EOSABS 0.3 08/10/2021 1023   BASOSABS 0.1 12/15/2022 1402   BASOSABS 0.1 08/10/2021 1023    ASSESSMENT AND PLAN:   1. Type 2 diabetes mellitus with other specified complication, without long-term current use of insulin (HCC) A1c close to goal.  Blood sugars have been good.  Continue Januvia and healthy eating habits.  2. Hot flashes Doing well on Veozah.  Had LFTs done the end of August which were normal.  3. Hypokalemia This has been a recurrent issue for her.  Potassium and furosemide dosages have been recently adjusted by cardiology. Keep upcoming appointment with nephrology to rule out any potassium renal wasting syndrome 4. Itching Stop Benadryl.  Advised to take Claritin daily.  Will refer to allergist to rule out any environmental allergies. - loratadine (CLARITIN) 10 MG tablet; Take 1 tablet (10 mg total) by mouth daily.  Dispense: 90 tablet; Refill: 1 - Ambulatory referral to Allergy  5. Dermatitis She has a follow-up appointment again with dermatology.  Advised that she speaks with her gastroenterologist at the next visit also as Entyvio may be causing or contributing  6. Chronic systolic CHF (congestive heart failure) (HCC) Stable and compensated.  Continue Farxiga, furosemide 40 mg twice a day, Corlanor 2.5 mg twice a day, metoprolol 25 mg half a tablet daily, spironolactone 25 mg daily  7. Encounter for immunization - Flu vaccine trivalent PF, 6mos and older(Flulaval,Afluria,Fluarix,Fluzone)      There are no diagnoses linked to this encounter.   Patient was given the opportunity to ask questions.  Patient verbalized understanding of the plan and was able to repeat key elements of the plan.   This documentation was completed using Paediatric nurse.  Any transcriptional errors are unintentional.  Orders Placed This  Encounter  Procedures   Flu vaccine trivalent PF, 6mos and older(Flulaval,Afluria,Fluarix,Fluzone)   Ambulatory referral to Allergy     Requested Prescriptions   Signed Prescriptions Disp Refills   loratadine (CLARITIN) 10 MG tablet 90 tablet 1    Sig: Take 1 tablet (10 mg total) by mouth daily.    Return in about 4 months (around 05/18/2023) for Give appt with RN in 1-2 wks for Shingrix vaccine.  Jonah Blue, MD, FACP

## 2023-01-15 NOTE — Patient Instructions (Signed)
Stop Benadryl. Start Claritin 10 mg daily to see if it will help decrease the itching on the rash. I have referred you to an allergist.  Keep upcoming appointment with nephrologist.

## 2023-01-16 LAB — CUP PACEART REMOTE DEVICE CHECK
Battery Remaining Longevity: 174 mo
Battery Remaining Percentage: 100 %
Brady Statistic RV Percent Paced: 0 %
Date Time Interrogation Session: 20241002030500
HighPow Impedance: 94 Ohm
Implantable Lead Connection Status: 753985
Implantable Lead Implant Date: 20230407
Implantable Lead Location: 753860
Implantable Lead Model: 138
Implantable Lead Serial Number: 303166
Implantable Pulse Generator Implant Date: 20230407
Lead Channel Impedance Value: 917 Ohm
Lead Channel Pacing Threshold Amplitude: 1.1 V
Lead Channel Pacing Threshold Pulse Width: 0.4 ms
Lead Channel Setting Pacing Amplitude: 2.5 V
Lead Channel Setting Pacing Pulse Width: 0.4 ms
Lead Channel Setting Sensing Sensitivity: 0.5 mV
Pulse Gen Serial Number: 216478
Zone Setting Status: 755011

## 2023-01-16 NOTE — Progress Notes (Signed)
ADVANCED HF CLINIC NOTE   PCP: Marcine Matar, MD Cardiology: Dr. Duke Salvia HF Cardiologist: Dr. Gala Romney  HPI: Christine [name] is a 57 y.o.female with history of chronic tobacco use, ADHD, anxiety, depression, CAD, systolic HF   Presented to ED 65/7/84 with increased shortness of breath/tachycardia. Adderrall stopped. Echo with EF 20-25%,  LHC/RHC with single vessel CAD (occluded mLAD) elevated filling pressures and  moderately reduced CO. Digoxin added. Beta blocker stopped.  Discharged to home 01/25/21. Discharge weight 213 pounds.   Echo 05/11/21 EF < 20% LV severely dilated RV ok Mild MR. Referred for ICD.  CPX with very mild HF limitation with elevated Ve/VCO2 slope  S/p [place] [employer] ICD 4/23.  Saw PCP 08/10/21 for dizziness and anxiety. Felt insomnia related, labs unremarkable.  Seen in ED 08/18/21 with sudden onset SOB. CXR and EKG re-assuring, Trop x 2 normal. BNP mildly up, advised admission to further work up. She declined and was elected to discharge home.  Seen in ED 10/06/21 for urinary retention, required foley. Felt to be 2/2 to prazosin and this was stopped.  Admitted (03/01/22) with CP and chronic diarrhea. Hstrop negative. Felt to have Crohn's disease. Losartan and spiro stopped due to low BP.  Following with GI (Dr. Bethann Berkshire)  Multiple ED visits for various issues.   Monitor in 12/23: Rare PVCs 2 episodes of brief SVT (4 beats)   Follow up 3/24, reported NYHA III symptoms but objective findings stable. Repeat echo (4/24) showed improved EF 35-40%, garde 1 DD, RV ok  Hospitalized 8/24 for hypokalemia. K 2.3. Repleted. Discharged next day.   Covid + 12/15/22. Completed Molnupiravir.   Today she returns for HF follow up. Overall feeling fine, continues to struggle with hypokalemia. She has SOB walking up steps. She feels she has 3-4 lbs fluid on board.  Feels palpitations in the AM. Denies abnormal bleeding, CP, dizziness, edema, or PND/Orthopnea. Chronically  sleeps in recliner. Appetite ok. No fever or chills. Weight at home 178 pounds. Taking all medications. Takes metolazone rarely, last dose 6 weeks ago.  Cardiac Testing  - Echo (4/24): EF 35-40%, garde 1 DD, RV ok - Echo (05/11/21): EF < 20% LV severely dilated RV ok Mild MR  - Echo (10/22): EF 20-25% LV severely dilated, LV apical mural thrombus, RV okay, moderate MR. - R/LHC (10/22): w/ severe 1v CAD with occlusion of mid LAD., PCW 27, CO 4.7, CI 2.2  - cMRI (10/22): demonstrated subendocardial LGE consistent with prior infarcts in LV basal inferolateral wall, apical anterior/septal/inferior walls and apex. LVEF 22%. - CPX 3/23: FVC 3.60 (85%)      FEV1 2.89 (87%)        FEV1/FVC 80 (100%)        MVV 171 (157%)   Resting HR: 113 Standing HR: 113 Peak HR: 149   (89 % age predicted max HR)  BP rest: 98/60 Standing BP: 90/62 BP peak: 134/58  Peak VO2: 18.8 (90% predicted peak VO2)  VE/VCO2 slope:  33 Peak RER: 1.14  VE/MVV:  38% O2pulse:  11   (100 % predicted O2pulse)   ROS: All systems negative except as listed in HPI, PMH and Problem List.  SH:  Social History   Socioeconomic History   Marital status: Single    Spouse name: Not on file   Number of children: 0   Years of education: 16   Highest education level: Bachelor's degree (e.g., BA, AB, BS)  Occupational History   Not on file  Tobacco  Use   Smoking status: Former    Current packs/day: 0.00    Types: Cigarettes    Quit date: 01/15/2021    Years since quitting: 2.0   Smokeless tobacco: Never  Vaping Use   Vaping status: Never Used  Substance and Sexual Activity   Alcohol use: Yes    Comment: rarely   Drug use: Never   Sexual activity: Not on file  Other Topics Concern   Not on file  Social History Narrative   Not on file   Social Determinants of Health   Financial Resource Strain: Medium Risk (12/13/2022)   Overall Financial Resource Strain (CARDIA)    Difficulty of Paying Living Expenses: Somewhat hard   Food Insecurity: No Food Insecurity (12/13/2022)   Hunger Vital Sign    Worried About Running Out of Food in the Last Year: Never true    Ran Out of Food in the Last Year: Never true  Transportation Needs: No Transportation Needs (12/13/2022)   PRAPARE - Administrator, Civil Service (Medical): No    Lack of Transportation (Non-Medical): No  Physical Activity: Insufficiently Active (09/12/2022)   Exercise Vital Sign    Days of Exercise per Week: 4 days    Minutes of Exercise per Session: 20 min  Stress: No Stress Concern Present (09/12/2022)   [employer] of Occupational Health - Occupational Stress Questionnaire    Feeling of Stress : Only a little  Social Connections: Moderately Isolated (09/12/2022)   Social Connection and Isolation Panel [NHANES]    Frequency of Communication with Friends and Family: Twice a week    Frequency of Social Gatherings with Friends and Family: Once a week    Attends Religious Services: 1 to 4 times per year    Active Member of [employer] or Organizations: No    Attends Engineer, structural: Not on file    Marital Status: Never married  Intimate Partner Violence: Unknown (07/21/2021)   Received from [employer], [facility]   HITS    Physically Hurt: Not on file    Insult or Talk Down To: Not on file    Threaten Physical Harm: Not on file    Scream or Curse: Not on file   FH:  Family History  Problem Relation Age of Onset   Stroke Mother    Atrial fibrillation Mother    Heart failure Father    Stomach cancer Maternal Grandfather    Liver cancer Neg Hx    Esophageal cancer Neg Hx    Colon polyps Neg Hx    Colon cancer Neg Hx    Past Medical History:  Diagnosis Date   Allergy    Anxiety 01/20/2021   CAD in native artery 08/24/2021   CHF (congestive heart failure) (HCC)    Chronic combined systolic and diastolic heart failure (HCC) 08/24/2021   Complication of anesthesia    Depression    Hyperlipidemia    Myocardial  infarction [facility])    Presence of cardiac defibrillator 07/2021   Shortness of breath 01/20/2021   Sleep apnea 07/2021   Tobacco abuse 01/20/2021   Current Outpatient Medications  Medication Sig Dispense Refill   Accu-Chek Softclix Lancets lancets Use as instructed.  Check blood sugar daily before breakfast. 100 each 12   acetaminophen (TYLENOL) 500 MG tablet Take 1,000 mg by mouth 3 (three) times daily as needed for moderate pain or headache.     ALPRAZolam (XANAX) 1 MG tablet Take 2 mg by mouth 3 (  three) times daily.     alum & mag hydroxide-simeth (MAALOX/MYLANTA) 200-200-20 MG/5ML suspension Take 30 mLs by mouth every 4 (four) hours as needed for indigestion or heartburn. 355 mL 0   apixaban (ELIQUIS) 5 MG TABS tablet Take 1 tablet (5 mg total) by mouth 2 (two) times daily. 60 tablet 11   atorvastatin (LIPITOR) 80 MG tablet Take 1 tablet (80 mg total) by mouth daily. 90 tablet 3   Blood Glucose Monitoring Suppl (ACCU-CHEK GUIDE) w/Device KIT Check blood sugar once daily before breakfast 1 kit 0   buPROPion (WELLBUTRIN XL) 150 MG 24 hr tablet Take 150 mg by mouth daily.     dapagliflozin propanediol (FARXIGA) 10 MG TABS tablet Take 1 tablet (10 mg total) by mouth daily. 30 tablet 9   diphenhydrAMINE (BENADRYL) 25 MG tablet Take 50 mg by mouth as needed for allergies.     diphenoxylate-atropine (LOMOTIL) 2.5-0.025 MG tablet Take 2 tablets by mouth 4 (four) times daily as needed for diarrhea/loose stools. (Patient taking differently: Take 2 tablets by mouth 4 (four) times daily as needed for diarrhea or loose stools.) 60 tablet 3   feeding supplement (ENSURE ENLIVE / ENSURE PLUS) LIQD Take 237 mLs by mouth 2 (two) times daily between meals. 237 mL 12   Fezolinetant (VEOZAH) 45 MG TABS Take 1 tablet (45 mg total) by mouth daily. 30 tablet 2   furosemide (LASIX) 20 MG tablet Take 2 tablets (40 mg total) by mouth 2 (two) times daily. 120 tablet 3   glucose blood (ACCU-CHEK GUIDE) test strip Use as  instructed to check blood sugar daily before breakfast 100 each 12   ivabradine (CORLANOR) 5 MG TABS tablet Take 0.5 tablets (2.5 mg total) by mouth 2 (two) times daily. 30 tablet 5   lipase/protease/amylase (CREON) 36000 UNITS CPEP capsule Take 2 capsules ( 72000 units) with meals and 1 capsule ( 36000 units) with snack. (Patient taking differently: 36,000 Units with breakfast, with lunch, and with evening meal. Take 1 tablet before breakfast) 300 capsule 5   loratadine (CLARITIN) 10 MG tablet Take 1 tablet (10 mg total) by mouth daily. 90 tablet 1   metoprolol succinate (TOPROL-XL) 25 MG 24 hr tablet Take 1/2 tablet (12.5 mg total) by mouth daily. 90 tablet 0   metroNIDAZOLE (METROGEL) 1 % gel Apply 1 Application topically as needed (rash).     Multiple Vitamins-Minerals (MULTIVITAMIN WITH MINERALS) tablet Take 1 tablet by mouth daily.     oxymetazoline (AFRIN) 0.05 % nasal spray Place 1-2 sprays into both nostrils 2 (two) times daily as needed for congestion.     potassium chloride SA (KLOR-CON M) 20 MEQ tablet Take 4 tablets (80 mEq total) by mouth 2 (two) times daily. (Patient taking differently: Take 80 mEq by mouth 2 (two) times daily. Taking 4 tablets three (3) times a day) 240 tablet 5   prazosin (MINIPRESS) 2 MG capsule Take 2-6 mg by mouth at bedtime.     promethazine (PHENERGAN) 25 MG tablet Take 1 tablet (25 mg total) by mouth every 6 (six) to 8 (eight) hours for nausea or vomiting. 90 tablet 2   sitaGLIPtin (JANUVIA) 50 MG tablet Take 1 tablet (50 mg total) by mouth daily. 30 tablet 1   spironolactone (ALDACTONE) 25 MG tablet Take 1 tablet (25 mg total) by mouth daily. 90 tablet 3   triamcinolone cream (KENALOG) 0.1 % Apply 1 Application (1 gram) topically 2 (two) times daily. 80 g 1   vedolizumab (ENTYVIO) 300  MG injection Inject 300 mg into the vein every 2 (two) months.     zolpidem (AMBIEN) 10 MG tablet Take 5-10 mg by mouth at bedtime.     No current facility-administered  medications for this encounter.   BP 100/70   Pulse 74   Wt 81 kg (178 lb 9.6 oz)   SpO2 95%   BMI 25.63 kg/m   Wt Readings from Last 3 Encounters:  01/21/23 81 kg (178 lb 9.6 oz)  01/15/23 83 kg (183 lb)  12/24/22 82.4 kg (181 lb 9.6 oz)   PHYSICAL EXAM: General:  NAD. No resp difficulty, walked into clinic HEENT: Normal Neck: Supple. No JVD. Carotids 2+ bilat; no bruits. No lymphadenopathy or thryomegaly appreciated. Cor: PMI nondisplaced. Regular rate & rhythm. No rubs, gallops or murmurs. Lungs: Clear Abdomen: Soft, nontender, nondistended. No hepatosplenomegaly. No bruits or masses. Good bowel sounds. Extremities: No cyanosis, clubbing, rash, edema Neuro: Alert & oriented x 3, cranial nerves grossly intact. Moves all 4 extremities w/o difficulty. Affect pleasant.  ECG (personally reviewed): NSR 75 bpm  ICD interrogation (personally reviewed): HL Score 2, fluid ok, average HR 62 bpm, no VT, 0.9 hr/day activity   ASSESSMENT & PLAN: 1. 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 Echo EF 20-25%, severely dilated LV, RV okay, moderate MR - R/LHC (10/22): 1v CAD occluded mid LAD PCWP 27,  Fick 4.7/2.  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 No viability. RV okay. Not sure how to explain inferior defects on cMRI  - Echo (05/11/21) EF < 20% RV ok - CPX 1/23 with very mild HF limitation:  Peak VO2: 18.8 (90% predicted peak VO2) VE/VCO2 slope: 33 Peak RER: 1.14  - s/p [employer] ICD 4/23. - Echo (4/24): EF 35-40%, RV ok - Reports NYHA II-early III symptoms, but objective findings are stable. Volume stable on exam and by device interrogation. - With improving EF now off digoxin. - Continue Lasix 40 mg bid + 80 KCL tid (with Crohn's and frequent hypokalemic episodes). - Continue judicious use of metolazone/extra 40 KCL.  - Continue Ivabradine 2.5 mg bid.  -  Continue Toprol XL 12.5 mg daily. - Continue spiro 25 mg daily. Ideally would increase to help with hypoK, but no BP room today - Continue Farxiga 10 mg daily.  - Off losartan due to low BP. BP too low to re-challenge - Labs today. - She is enrolled in monthly iCM monitoring.   2. Palpitations - No longer on Adderall. - Zio 14-day (11/22) showed mostly SR, no high-grade arrhythmias. - Monitor 12/23 SR. Two brief runs SVT (4 beats)  - ECG stable today.  3. CAD - Single vessel LAD occlusion on LHC. - No s/s ischemia - Continue statin. - No ASA with Eliquis.   4. Tobacco use - Remains quit from cigarettes.  5. LV apical thrombus - Noted on echo/cMRI.  - None on most recent echo 4/24. - Continue Eliquis. No bleeding issues.   6. Crohn's  - Following with GI.   7. OSA - Mild on sleep study, AHI 11.8 - Follows with Dr. Mayford Knife, referred to sleep dentistry to discuss oral device.  8. Polycythemia - Follows with Heme/Onc. - JAK negative  9. AAA - CT scan 11/23  3.1cm - Follow u/s in 3 years recommended (02/2025)  10. Hypokalemia - Chronic, Crohn's likely contributing. - Will try to augment MRA as  much as BP allows. - ? checking urine electrolyte. She has been referred to nephrology, has appt soon. - Follow BMET q 2 weeks x 2 months until she can achieve a steady state  Follow up in 4 months with Dr. Mickle Plumb FNP-BC 1:44 PM

## 2023-01-17 ENCOUNTER — Ambulatory Visit (INDEPENDENT_AMBULATORY_CARE_PROVIDER_SITE_OTHER): Payer: Medicaid Other

## 2023-01-17 DIAGNOSIS — I425 Other restrictive cardiomyopathy: Secondary | ICD-10-CM | POA: Diagnosis not present

## 2023-01-21 ENCOUNTER — Encounter (HOSPITAL_COMMUNITY): Payer: Self-pay

## 2023-01-21 ENCOUNTER — Ambulatory Visit (HOSPITAL_COMMUNITY)
Admission: RE | Admit: 2023-01-21 | Discharge: 2023-01-21 | Disposition: A | Discharge status: Home / Self Care | Payer: Medicaid Other | Source: Ambulatory Visit | Attending: Family Medicine | Admitting: Family Medicine

## 2023-01-21 VITALS — BP 100/70 | HR 74 | Wt 178.6 lb

## 2023-01-21 DIAGNOSIS — Z95810 Presence of automatic (implantable) cardiac defibrillator: Secondary | ICD-10-CM

## 2023-01-21 DIAGNOSIS — E876 Hypokalemia: Secondary | ICD-10-CM

## 2023-01-21 DIAGNOSIS — D751 Secondary polycythemia: Secondary | ICD-10-CM

## 2023-01-21 DIAGNOSIS — F419 Anxiety disorder, unspecified: Secondary | ICD-10-CM | POA: Diagnosis not present

## 2023-01-21 DIAGNOSIS — Z87891 Personal history of nicotine dependence: Secondary | ICD-10-CM

## 2023-01-21 DIAGNOSIS — K5090 Crohn's disease, unspecified, without complications: Secondary | ICD-10-CM | POA: Insufficient documentation

## 2023-01-21 DIAGNOSIS — I2510 Atherosclerotic heart disease of native coronary artery without angina pectoris: Secondary | ICD-10-CM | POA: Diagnosis not present

## 2023-01-21 DIAGNOSIS — Z79899 Other long term (current) drug therapy: Secondary | ICD-10-CM | POA: Diagnosis not present

## 2023-01-21 DIAGNOSIS — R002 Palpitations: Secondary | ICD-10-CM

## 2023-01-21 DIAGNOSIS — Z5986 Financial insecurity: Secondary | ICD-10-CM | POA: Insufficient documentation

## 2023-01-21 DIAGNOSIS — K50919 Crohn's disease, unspecified, with unspecified complications: Secondary | ICD-10-CM

## 2023-01-21 DIAGNOSIS — F32A Depression, unspecified: Secondary | ICD-10-CM | POA: Diagnosis not present

## 2023-01-21 DIAGNOSIS — Z7901 Long term (current) use of anticoagulants: Secondary | ICD-10-CM | POA: Insufficient documentation

## 2023-01-21 DIAGNOSIS — I5022 Chronic systolic (congestive) heart failure: Secondary | ICD-10-CM

## 2023-01-21 DIAGNOSIS — G4733 Obstructive sleep apnea (adult) (pediatric): Secondary | ICD-10-CM | POA: Diagnosis not present

## 2023-01-21 DIAGNOSIS — I714 Abdominal aortic aneurysm, without rupture, unspecified: Secondary | ICD-10-CM

## 2023-01-21 DIAGNOSIS — I513 Intracardiac thrombosis, not elsewhere classified: Secondary | ICD-10-CM | POA: Diagnosis not present

## 2023-01-21 DIAGNOSIS — I471 Supraventricular tachycardia, unspecified: Secondary | ICD-10-CM | POA: Insufficient documentation

## 2023-01-21 LAB — BASIC METABOLIC PANEL
Anion gap: 13 (ref 5–15)
BUN: 21 mg/dL — ABNORMAL HIGH (ref 6–20)
CO2: 31 mmol/L (ref 22–32)
Calcium: 9.8 mg/dL (ref 8.9–10.3)
Chloride: 89 mmol/L — ABNORMAL LOW (ref 98–111)
Creatinine, Ser: 1.14 mg/dL — ABNORMAL HIGH (ref 0.44–1.00)
GFR, Estimated: 57 mL/min — ABNORMAL LOW (ref >60)
Glucose, Bld: 98 mg/dL (ref 70–99)
Potassium: 3.6 mmol/L (ref 3.5–5.1)
Sodium: 133 mmol/L — ABNORMAL LOW (ref 135–145)

## 2023-01-21 NOTE — Patient Instructions (Addendum)
Thank you for coming in today  If you had labs drawn today, any labs that are abnormal the clinic will call you No news is good news  Medications: No changes  Follow up appointments: Your physician recommends that you return for lab work in:  2 weeks , 4 weeks, 6 weeks, and 8 weeks for BMET   Your physician recommends that you schedule a follow-up appointment in:  4 months With Dr. Gala Romney You will receive a reminder letter in the mail a few months in advance. If you don't receive a letter, please call our office to schedule the follow-up appointment.    Do the following things EVERYDAY: Weigh yourself in the morning before breakfast. Write it down and keep it in a log. Take your medicines as prescribed Eat low salt foods--Limit salt (sodium) to 2000 mg per day.  Stay as active as you can everyday Limit all fluids for the day to less than 2 liters   At the [facility], you and your health needs are our priority. As part of our continuing mission to provide you with exceptional heart care, we have created designated Provider Care Teams. These Care Teams include your primary Cardiologist (physician) and Advanced Practice Providers (APPs- Physician Assistants and Nurse Practitioners) who all work together to provide you with the care you need, when you need it.   You may see any of the following providers on your designated Care Team at your next follow up: Dr Arvilla Meres Dr Marca Ancona Dr. Marcos Eke, NP Robbie Lis, [place] [facility] [place], [place] Brynda Peon, NP Karle Plumber, PharmD   Please be sure to bring in all your medications bottles to every appointment.    Thank you for choosing [facility]  If you have any questions or concerns before your next appointment please send [place] a message through [place] or call our office at [phone].    TO LEAVE A MESSAGE FOR THE NURSE  SELECT OPTION 2, PLEASE LEAVE A MESSAGE INCLUDING: YOUR NAME DATE OF BIRTH CALL BACK NUMBER REASON FOR CALL**this is important as we prioritize the call backs  YOU WILL RECEIVE A CALL BACK THE SAME DAY AS LONG AS YOU CALL BEFORE 4:00 PM

## 2023-01-27 ENCOUNTER — Other Ambulatory Visit (HOSPITAL_COMMUNITY): Payer: Self-pay | Admitting: Cardiology

## 2023-01-28 ENCOUNTER — Other Ambulatory Visit (HOSPITAL_COMMUNITY): Payer: Self-pay

## 2023-01-28 ENCOUNTER — Other Ambulatory Visit: Payer: Self-pay

## 2023-01-28 MED ORDER — ATORVASTATIN CALCIUM 80 MG PO TABS
80.0000 mg | ORAL_TABLET | Freq: Every day | ORAL | 3 refills | Status: DC
  Filled 2023-01-28: qty 90, 90d supply, fill #0
  Filled 2023-04-30: qty 90, 90d supply, fill #1
  Filled 2023-08-02: qty 90, 90d supply, fill #2
  Filled 2023-08-12: qty 90, 90d supply, fill #0

## 2023-01-29 ENCOUNTER — Ambulatory Visit: Payer: Medicaid Other | Attending: Internal Medicine

## 2023-01-29 DIAGNOSIS — Z23 Encounter for immunization: Secondary | ICD-10-CM

## 2023-01-30 ENCOUNTER — Other Ambulatory Visit: Payer: Self-pay

## 2023-01-30 ENCOUNTER — Encounter (HOSPITAL_BASED_OUTPATIENT_CLINIC_OR_DEPARTMENT_OTHER): Payer: Self-pay | Admitting: Emergency Medicine

## 2023-01-30 ENCOUNTER — Emergency Department (HOSPITAL_BASED_OUTPATIENT_CLINIC_OR_DEPARTMENT_OTHER)
Admission: EM | Admit: 2023-01-30 | Discharge: 2023-01-30 | Disposition: A | Discharge status: Home / Self Care | Payer: Medicaid Other

## 2023-01-30 DIAGNOSIS — Z87891 Personal history of nicotine dependence: Secondary | ICD-10-CM | POA: Insufficient documentation

## 2023-01-30 DIAGNOSIS — Z7901 Long term (current) use of anticoagulants: Secondary | ICD-10-CM | POA: Diagnosis not present

## 2023-01-30 DIAGNOSIS — R252 Cramp and spasm: Secondary | ICD-10-CM | POA: Diagnosis not present

## 2023-01-30 HISTORY — DX: Crohn's disease of large intestine without complications: K50.10

## 2023-01-30 LAB — CBC
HCT: 46.9 % — ABNORMAL HIGH (ref 36.0–46.0)
Hemoglobin: 16 g/dL — ABNORMAL HIGH (ref 12.0–15.0)
MCH: 28.6 pg (ref 26.0–34.0)
MCHC: 34.1 g/dL (ref 30.0–36.0)
MCV: 83.8 fL (ref 80.0–100.0)
Platelets: 390 10*3/uL (ref 150–400)
RBC: 5.6 MIL/uL — ABNORMAL HIGH (ref 3.87–5.11)
RDW: 13.2 % (ref 11.5–15.5)
WBC: 12.7 10*3/uL — ABNORMAL HIGH (ref 4.0–10.5)
nRBC: 0 % (ref 0.0–0.2)

## 2023-01-30 LAB — BASIC METABOLIC PANEL
Anion gap: 11 (ref 5–15)
BUN: 37 mg/dL — ABNORMAL HIGH (ref 6–20)
CO2: 31 mmol/L (ref 22–32)
Calcium: 10.2 mg/dL (ref 8.9–10.3)
Chloride: 91 mmol/L — ABNORMAL LOW (ref 98–111)
Creatinine, Ser: 1.24 mg/dL — ABNORMAL HIGH (ref 0.44–1.00)
GFR, Estimated: 51 mL/min — ABNORMAL LOW (ref >60)
Glucose, Bld: 148 mg/dL — ABNORMAL HIGH (ref 70–99)
Potassium: 3.8 mmol/L (ref 3.5–5.1)
Sodium: 133 mmol/L — ABNORMAL LOW (ref 135–145)

## 2023-01-30 LAB — MAGNESIUM: Magnesium: 2.1 mg/dL (ref 1.7–2.4)

## 2023-01-30 NOTE — ED Provider Notes (Signed)
[place] EMERGENCY DEPARTMENT AT [facility] Provider Note   CSN: [id number] Arrival date & time: 01/30/23  7829     History  No chief complaint on file.   Christine [name] is a 57 y.o. female.  57 year old female with history of hypokalemia presenting emergency department for feet cramping overnight.  States this is a ongoing problem and she takes potassium and has her potassium checked every 2 weeks.  She has been hospitalized prior for her low potassium and notes that when she starts having cramping her potassium is typically pretty low.  She took 40 mill equivalents potassium this morning.  She reports some improvement of her cramping.  No fevers, chills, chest pain, shortness of breath abdominal pain nausea vomiting diarrhea.  States she otherwise feels herself except for being tired from not sleeping very well.        Home Medications Prior to Admission medications   Medication Sig Start Date End Date Taking? Authorizing Provider  Accu-Chek Softclix Lancets lancets Use as instructed.  Check blood sugar daily before breakfast. 11/02/22   Marcine Matar, MD  acetaminophen (TYLENOL) 500 MG tablet Take 1,000 mg by mouth 3 (three) times daily as needed for moderate pain or headache.    [provider]  ALPRAZolam Prudy Feeler) 1 MG tablet Take 2 mg by mouth 3 (three) times daily.    [provider]  alum & mag hydroxide-simeth (MAALOX/MYLANTA) 200-200-20 MG/5ML suspension Take 30 mLs by mouth every 4 (four) hours as needed for indigestion or heartburn. 03/02/22   Atway, Rayann N, DO  apixaban (ELIQUIS) 5 MG TABS tablet Take 1 tablet (5 mg total) by mouth 2 (two) times daily. 11/05/22   Bensimhon, Bevelyn Buckles, MD  atorvastatin (LIPITOR) 80 MG tablet Take 1 tablet (80 mg total) by mouth daily. 01/28/23   Laurey Morale, MD  Blood Glucose Monitoring Suppl (ACCU-CHEK GUIDE) w/Device KIT Check blood sugar once daily before breakfast 11/02/22   Marcine Matar, MD   buPROPion (WELLBUTRIN XL) 150 MG 24 hr tablet Take 150 mg by mouth daily.    [provider]  dapagliflozin propanediol (FARXIGA) 10 MG TABS tablet Take 1 tablet (10 mg total) by mouth daily. 10/23/22   Bensimhon, Bevelyn Buckles, MD  diphenhydrAMINE (BENADRYL) 25 MG tablet Take 50 mg by mouth as needed for allergies.    [provider]  diphenoxylate-atropine (LOMOTIL) 2.5-0.025 MG tablet Take 2 tablets by mouth 4 (four) times daily as needed for diarrhea/loose stools. Patient taking differently: Take 2 tablets by mouth 4 (four) times daily as needed for diarrhea or loose stools. 11/19/22   Cirigliano, Vito V, DO  feeding supplement (ENSURE ENLIVE / ENSURE PLUS) LIQD Take 237 mLs by mouth 2 (two) times daily between meals. 03/03/22   Atway, Rayann N, DO  Fezolinetant (VEOZAH) 45 MG TABS Take 1 tablet (45 mg total) by mouth daily. 11/08/22   Marcine Matar, MD  furosemide (LASIX) 20 MG tablet Take 2 tablets (40 mg total) by mouth 2 (two) times daily. 12/12/22   Jacklynn Ganong, FNP  glucose blood (ACCU-CHEK GUIDE) test strip Use as instructed to check blood sugar daily before breakfast 11/02/22   Marcine Matar, MD  ivabradine (CORLANOR) 5 MG TABS tablet Take 0.5 tablets (2.5 mg total) by mouth 2 (two) times daily. 12/24/22   Alen Bleacher, NP  lipase/protease/amylase (CREON) 36000 UNITS CPEP capsule Take 2 capsules ( 72000 units) with meals and 1 capsule ( 36000 units) with snack.  Patient taking differently: 36,000 Units with breakfast, with lunch, and with evening meal. Take 1 tablet before breakfast 03/13/22   Cirigliano, Vito V, DO  loratadine (CLARITIN) 10 MG tablet Take 1 tablet (10 mg total) by mouth daily. 01/15/23   Marcine Matar, MD  metoprolol succinate (TOPROL-XL) 25 MG 24 hr tablet Take 1/2 tablet (12.5 mg total) by mouth daily. 12/10/22   Joseph Art, DO  metroNIDAZOLE (METROGEL) 1 % gel Apply 1 Application topically as needed (rash).    [provider]   Multiple Vitamins-Minerals (MULTIVITAMIN WITH MINERALS) tablet Take 1 tablet by mouth daily.    [provider]  oxymetazoline (AFRIN) 0.05 % nasal spray Place 1-2 sprays into both nostrils 2 (two) times daily as needed for congestion.    [provider]  potassium chloride SA (KLOR-CON M) 20 MEQ tablet Take 4 tablets (80 mEq total) by mouth 2 (two) times daily. Patient taking differently: Take 80 mEq by mouth 2 (two) times daily. Taking 4 tablets three (3) times a day 01/01/23   Jacklynn Ganong, FNP  prazosin (MINIPRESS) 2 MG capsule Take 2-6 mg by mouth at bedtime. 10/24/22   [provider]  promethazine (PHENERGAN) 25 MG tablet Take 1 tablet (25 mg total) by mouth every 6 (six) to 8 (eight) hours for nausea or vomiting. 11/22/22   Cirigliano, Vito V, DO  sitaGLIPtin (JANUVIA) 50 MG tablet Take 1 tablet (50 mg total) by mouth daily. 01/01/23   Marcine Matar, MD  spironolactone (ALDACTONE) 25 MG tablet Take 1 tablet (25 mg total) by mouth daily. 11/30/22   Bensimhon, Bevelyn Buckles, MD  triamcinolone cream (KENALOG) 0.1 % Apply 1 Application (1 gram) topically 2 (two) times daily. 01/08/23     vedolizumab (ENTYVIO) 300 MG injection Inject 300 mg into the vein every 2 (two) months.    [provider]  zolpidem (AMBIEN) 10 MG tablet Take 5-10 mg by mouth at bedtime.    [provider]      Allergies    Gabapentin, Sulfa antibiotics, Erythromycin, Tramadol, Ibuprofen, and Tape    Review of Systems   Review of Systems  Physical Exam Updated Vital Signs BP 125/72   Pulse 84   Temp 98.6 F (37 C)   Resp 13   SpO2 100%  Physical Exam Vitals and nursing note reviewed.  Constitutional:      General: She is not in acute distress.    Appearance: She is not toxic-appearing.  HENT:     Head: Normocephalic.     Nose: Nose normal.     Mouth/Throat:     Mouth: Mucous membranes are moist.  Eyes:     Conjunctiva/sclera: Conjunctivae normal.   Cardiovascular:     Rate and Rhythm: Normal rate and regular rhythm.  Pulmonary:     Effort: Pulmonary effort is normal.  Abdominal:     General: Abdomen is flat. There is no distension.     Tenderness: There is no abdominal tenderness. There is no guarding or rebound.  Musculoskeletal:        General: Normal range of motion.  Skin:    General: Skin is warm and dry.     Capillary Refill: Capillary refill takes less than 2 seconds.  Neurological:     Mental Status: She is alert and oriented to person, place, and time.  Psychiatric:        Mood and Affect: Mood normal.        Behavior: Behavior  normal.     ED Results / Procedures / Treatments   Labs (all labs ordered are listed, but only abnormal results are displayed) Labs Reviewed  CBC - Abnormal; Notable for the following components:      Result Value   WBC 12.7 (*)    RBC 5.60 (*)    Hemoglobin 16.0 (*)    HCT 46.9 (*)    All other components within normal limits  BASIC METABOLIC PANEL - Abnormal; Notable for the following components:   Sodium 133 (*)    Chloride 91 (*)    Glucose, Bld 148 (*)    BUN 37 (*)    Creatinine, Ser 1.24 (*)    GFR, Estimated 51 (*)    All other components within normal limits  MAGNESIUM    EKG None  Radiology No results found.  Procedures Procedures    Medications Ordered in ED Medications - No data to display  ED Course/ Medical Decision Making/ A&P Clinical Course as of 01/30/23 1020  Wed Jan 30, 2023  1308 Per chart review from PCP visit recently  "Patient with history of  combined CHF EF 20-25%, ICD 07/2021, CAD with occlusion of mid LAD, left ventricular apical thrombus, OSA on CPAP, obesity former smoker,, HL, anxiety, ADHD, depression, PTSD, polycythemia 2nd OSA, AAA 3.1 cm infrarenal (needs repeat imaging/US  2025-2026), Crohn's.      Low K+:  pt hosp in 11/2022 with symptomatic low potassium " [TY]  0923 CBC(!) Leukocytosis and polycythemia noted no  thrombocytosis. [TY]  1019 Potassium: 3.8 [TY]  1019 Magnesium: 2.1 [TY]  1019 Workup reassuring.  No significant metabolic derangements.  Normal kidney function.  Patient continues to have no leg cramping and feels okay.  Discussed follow-up with PCP/cardiology she is agreeable to plan.  Stable for discharge at this time. [TY]    Clinical Course User Index [TY] Coral Spikes, DO                                 Medical Decision Making 57 year old female to the emergency department for leg cramping.  Afebrile vital signs reassuring.  Patient not having cramping currently.  Given history of hypokalemia we will get EKG and basic labs.  Will replete electrolytes as indicated.  See ED course for final MDM disposition.  Amount and/or Complexity of Data Reviewed External Data Reviewed:     Details: See ED course for chart review Labs: ordered. Decision-making details documented in ED Course. Radiology:     Details: Considered ultrasound lower extremity, however warm well-perfused with good pulses.  Low suspicion for acute limb ischemia.  No unilateral swelling, erythema or redness to suggest blood clot/DVT. ECG/medicine tests: ordered and independent interpretation performed.    Details: Normal sinus rhythm, not appear to have findings consistent with hypo or hyperkalemia.  Risk Decision regarding hospitalization.          Final Clinical Impression(s) / ED Diagnoses Final diagnoses:  None    Rx / DC Orders ED Discharge Orders     None         Coral Spikes, DO 01/30/23 1020

## 2023-01-30 NOTE — Progress Notes (Signed)
Shingrix vaccine given in right deltoid Vaccine series complete

## 2023-01-30 NOTE — ED Notes (Signed)
Discharge paperwork given and verbally understood. 

## 2023-01-30 NOTE — ED Triage Notes (Signed)
Pt has had history since July of hypokalemia, to the point she has needed hospitalization.she gets her K drawn every 2 weeks. She has Physicist, medical. Md unsure if it is malabsorption or kidneys, has appt with nephrologist next week.   Last night she started with her feet cramping and this is always  her sign. She took 40 meq extra last night on top of her 280 meq daily.

## 2023-01-30 NOTE — Discharge Instructions (Signed)
Please follow-up with your primary doctor and cardiologist regarding your leg cramps and your potassium levels.  Return immediately if develop any new or worsening symptoms.

## 2023-02-01 ENCOUNTER — Other Ambulatory Visit: Payer: Self-pay | Admitting: Internal Medicine

## 2023-02-01 ENCOUNTER — Other Ambulatory Visit (HOSPITAL_COMMUNITY): Payer: Self-pay

## 2023-02-01 ENCOUNTER — Other Ambulatory Visit: Payer: Self-pay

## 2023-02-01 DIAGNOSIS — R232 Flushing: Secondary | ICD-10-CM

## 2023-02-01 MED ORDER — VEOZAH 45 MG PO TABS
45.0000 mg | ORAL_TABLET | Freq: Every day | ORAL | 2 refills | Status: DC
  Filled 2023-02-01: qty 30, 30d supply, fill #0
  Filled 2023-03-05: qty 30, 30d supply, fill #1
  Filled 2023-04-02: qty 30, 30d supply, fill #2

## 2023-02-01 NOTE — Progress Notes (Signed)
Remote ICD transmission.   

## 2023-02-01 NOTE — Telephone Encounter (Signed)
Requested medication (s) are due for refill today -yes  Requested medication (s) are on the active medication list -yes  Future visit scheduled -no  Last refill: 11/08/22 #30 2RF  Notes to clinic: non delegated Rx  Requested Prescriptions  Pending Prescriptions Disp Refills   Fezolinetant (VEOZAH) 45 MG TABS 30 tablet 2    Sig: Take 1 tablet (45 mg total) by mouth daily.     Off-Protocol Failed - 02/01/2023 12:40 PM      Failed - Medication not assigned to a protocol, review manually.      Passed - Valid encounter within last 12 months    Recent Outpatient Visits           2 weeks ago Type 2 diabetes mellitus with other specified complication, without long-term current use of insulin (HCC)   [place] [facility] Marcine Matar, MD   3 months ago Injury of great toenail   [place] [facility] Jonah Blue B, MD   4 months ago Coronary artery disease involving native coronary artery of native heart without angina pectoris   [facility] Jonah Blue B, MD   8 months ago 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, NYHA class 2 [facility])   [place] [facility] Marcine Matar, MD   10 months ago Leg cramps   [place] [facility] Marcine Matar, MD       Future Appointments             In 3 months Marcine Matar, MD [facility]               Requested Prescriptions  Pending Prescriptions Disp Refills   Fezolinetant (VEOZAH) 45 MG TABS 30 tablet 2    Sig: Take 1 tablet (45 mg total) by mouth daily.     Off-Protocol Failed - 02/01/2023 12:40 PM      Failed - Medication not assigned to a protocol, review manually.      Passed - Valid encounter within last 12 months    Recent Outpatient Visits           2 weeks ago Type 2 diabetes mellitus with other specified  complication, without long-term current use of insulin (HCC)   [place] [facility] Marcine Matar, MD   3 months ago Injury of great toenail   [place] [facility] Jonah Blue B, MD   4 months ago Coronary artery disease involving native coronary artery of native heart without angina pectoris   [place] [facility] Jonah Blue B, MD   8 months ago 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, NYHA class 2 [facility])    [facility] Marcine Matar, MD   10 months ago Leg cramps    [facility] Marcine Matar, MD       Future Appointments             In 3 months Laural Benes Binnie Rail, MD [facility]

## 2023-02-02 ENCOUNTER — Other Ambulatory Visit: Payer: Self-pay

## 2023-02-04 ENCOUNTER — Ambulatory Visit (INDEPENDENT_AMBULATORY_CARE_PROVIDER_SITE_OTHER): Payer: Medicaid Other

## 2023-02-04 ENCOUNTER — Ambulatory Visit
Admission: RE | Admit: 2023-02-04 | Discharge: 2023-02-04 | Disposition: A | Discharge status: Home / Self Care | Payer: Medicaid Other | Source: Ambulatory Visit | Attending: Family Medicine | Admitting: Family Medicine

## 2023-02-04 ENCOUNTER — Other Ambulatory Visit: Payer: Self-pay

## 2023-02-04 ENCOUNTER — Other Ambulatory Visit (HOSPITAL_COMMUNITY): Payer: Self-pay

## 2023-02-04 DIAGNOSIS — I5022 Chronic systolic (congestive) heart failure: Secondary | ICD-10-CM | POA: Diagnosis not present

## 2023-02-04 DIAGNOSIS — Z95810 Presence of automatic (implantable) cardiac defibrillator: Secondary | ICD-10-CM

## 2023-02-04 LAB — BASIC METABOLIC PANEL
Anion gap: 5 (ref 5–15)
BUN: 16 mg/dL (ref 6–20)
CO2: 30 mmol/L (ref 22–32)
Calcium: 9 mg/dL (ref 8.9–10.3)
Chloride: 102 mmol/L (ref 98–111)
Creatinine, Ser: 0.99 mg/dL (ref 0.44–1.00)
GFR, Estimated: >60 mL/min (ref >60)
Glucose, Bld: 88 mg/dL (ref 70–99)
Potassium: 4 mmol/L (ref 3.5–5.1)
Sodium: 137 mmol/L (ref 135–145)

## 2023-02-05 ENCOUNTER — Other Ambulatory Visit: Payer: Self-pay

## 2023-02-06 ENCOUNTER — Other Ambulatory Visit: Payer: Self-pay

## 2023-02-06 NOTE — Progress Notes (Signed)
EPIC Encounter for ICM Monitoring  Patient Name: Christine [name] is a 57 y.o. female Date: 02/06/2023 Primary Care Physican: Marcine Matar, MD Primary Cardiologist: [place]/Bensimhon Electrophysiologist: Graciela Husbands Last Weight: 185.5 lbs 12/19/2022 Weight: 179 lbs (baseline 177 lbs) 02/06/2023 Weight: 182.5 lbs                                                            1st ICM Remote Transmission.  Heart Failure questions reviewed.  She reported to HF clinic 10/21 at time of lab draw that she has a little SOB and has gained 5 lbs since the end of last week but HF clinic said no changes to meds at this time.  The symptoms appear to correlate with Index rising from 2 to 6.  She reported HF clinic did not want to make any changes at that time.   HeartLogic Heart Failure Index is 6 suggesting fluid levels are within normal threshold range.    Prescribed:  Furosemide 20 mg take 2 tablets (40 mg total) by mouth two times a day Potassium 20 mEq take 4 tablets (80 mEq total) by mouth two times a day Spironolactone 25 mg take 1 tablet by mouth daily.    Labs:  02/06/2023 Potassium checked every 2 weeks.   02/18/2023 Scheduled BMET at HF clinic 02/04/2023 Creatinine 0.99, BUN 16, Potassium 4.0, Sodium 137, GFR >60  01/30/2023 Creatinine 1.24, BUN 37, Potassium 3.8, Sodium 133, GFR 51  01/21/2023 Creatinine 1.14, BUN 21, Potassium 3.6, Sodium 133, GFR 57  01/14/2023 Creatinine 1.24, BUN 26, Potassium 2.8, Sodium 134, GFR 51 01/11/2023 Creatinine 1.37, BUN 29, Potassium 2.4, Sodium 133, GFR 46 12/27/2022 Creatinine 1.08, BUN 14, Potassium 3.4, Sodium 136, GFR >60 (low potassium addressed by HF clinic) 12/24/2022 Creatinine 0.94, BUN 17, Potassium 3.0, Sodium 136, GFR >60  12/19/2022 Creatinine 0.87, BUN 12, Potassium 3.6, Sodium 140, GFR >60  12/17/2022 Creatinine 1.01, BUN 17, Potassium 3.7, Sodium 137, GFR >60 12/15/2022 Creatinine 0.92, BUN 11, Potassium 3.9, Sodium 135, GFR >60  12/11/2022  Creatinine 0.81, BUN 12, Potassium 4.6, Sodium 139, GFR >60  12/09/2022 Creatinine 0.94, BUN 18, Potassium 4.8, Sodium 136, GFR >60  12/08/2022 Creatinine 1.18, BUN 22, Potassium 4.3, Sodium 133, GFR 55  12/07/2022 Creatinine 1.31, BUN 32, Potassium 2.3, Sodium 133, GFR 48  A complete set of results can be found in Results Review.   Recommendations:  Advised to call HF clinic if SOB worsens and/or weight gain continues or use ER if needed.  She has appointment with Kidney doctor next week.    Follow-up plan: ICM clinic phone appointment on 02/12/2023 to recheck fluid levels.   91 day device clinic remote transmission 04/19/2023.              EP/Cardiology next office visit:  Recall 04/07/2023 with Dr Gala Romney.          Copy of ICM check sent to Dr. Graciela Husbands.  3 Month HeartLogicT Heart Failure Index:    8 Day Data Trend:          Karie Soda, RN 02/06/2023 10:13 AM

## 2023-02-07 ENCOUNTER — Other Ambulatory Visit (HOSPITAL_COMMUNITY): Payer: Self-pay

## 2023-02-07 ENCOUNTER — Telehealth (HOSPITAL_COMMUNITY): Payer: Self-pay | Admitting: Cardiology

## 2023-02-07 MED ORDER — METOLAZONE 2.5 MG PO TABS
2.5000 mg | ORAL_TABLET | ORAL | 0 refills | Status: DC
Start: 2023-02-07 — End: 2023-03-22
  Filled 2023-02-07: qty 5, 5d supply, fill #0

## 2023-02-07 NOTE — Telephone Encounter (Signed)
Pt aware.

## 2023-02-07 NOTE — Telephone Encounter (Signed)
Patient called to report 6lb weight gain  since Sun  Weight was 178 Weight today 185  Reports increase in SOB and very uncomfortable  Would like to take extra lasix for a few days

## 2023-02-12 ENCOUNTER — Encounter: Payer: Self-pay | Admitting: Cardiology

## 2023-02-12 ENCOUNTER — Ambulatory Visit: Payer: Medicaid Other | Attending: Internal Medicine

## 2023-02-12 DIAGNOSIS — L989 Disorder of the skin and subcutaneous tissue, unspecified: Secondary | ICD-10-CM | POA: Diagnosis not present

## 2023-02-12 DIAGNOSIS — Z95810 Presence of automatic (implantable) cardiac defibrillator: Secondary | ICD-10-CM

## 2023-02-12 DIAGNOSIS — I5022 Chronic systolic (congestive) heart failure: Secondary | ICD-10-CM

## 2023-02-13 DIAGNOSIS — R7989 Other specified abnormal findings of blood chemistry: Secondary | ICD-10-CM | POA: Diagnosis not present

## 2023-02-13 DIAGNOSIS — I5022 Chronic systolic (congestive) heart failure: Secondary | ICD-10-CM | POA: Diagnosis not present

## 2023-02-13 DIAGNOSIS — I2510 Atherosclerotic heart disease of native coronary artery without angina pectoris: Secondary | ICD-10-CM | POA: Diagnosis not present

## 2023-02-13 DIAGNOSIS — E876 Hypokalemia: Secondary | ICD-10-CM | POA: Diagnosis not present

## 2023-02-13 DIAGNOSIS — Z8719 Personal history of other diseases of the digestive system: Secondary | ICD-10-CM | POA: Diagnosis not present

## 2023-02-13 DIAGNOSIS — E1122 Type 2 diabetes mellitus with diabetic chronic kidney disease: Secondary | ICD-10-CM | POA: Diagnosis not present

## 2023-02-13 NOTE — Progress Notes (Signed)
EPIC Encounter for ICM Monitoring  Patient Name: Christine [name] is a 57 y.o. female Date: 02/13/2023 Primary Care Physican: Marcine Matar, MD Primary Cardiologist: [place]/Bensimhon Electrophysiologist: Graciela Husbands Last Weight: 185.5 lbs 12/19/2022 Weight: 179 lbs (baseline 177 lbs) 02/06/2023 Weight: 182.5 lbs 02/13/2023 Weight: 181 lbs                                                             1st ICM Remote Transmission.  Heart Failure questions reviewed.  She notified HF clinic on 10/25 of symptoms and given 1 time dose of Metolazone with 40 KCL which resolved SOB.  Weight is still above baseline but stable.    HeartLogic Heart Failure Index is 7 suggesting fluid levels are within normal threshold range. Thoracic impedance trending higher after taking Metolazone suggesting fluid level improvement.    Prescribed:  Furosemide 20 mg take 2 tablets (40 mg total) by mouth two times a day Potassium 20 mEq take 4 tablets (80 mEq total) by mouth two times a day Spironolactone 25 mg take 1 tablet by mouth daily.    Labs: Potassium checked every 2 weeks.   02/18/2023 Scheduled BMET at HF clinic 02/04/2023 Creatinine 0.99, BUN 16, Potassium 4.0, Sodium 137, GFR >60  01/30/2023 Creatinine 1.24, BUN 37, Potassium 3.8, Sodium 133, GFR 51  01/21/2023 Creatinine 1.14, BUN 21, Potassium 3.6, Sodium 133, GFR 57  01/14/2023 Creatinine 1.24, BUN 26, Potassium 2.8, Sodium 134, GFR 51 01/11/2023 Creatinine 1.37, BUN 29, Potassium 2.4, Sodium 133, GFR 46 12/27/2022 Creatinine 1.08, BUN 14, Potassium 3.4, Sodium 136, GFR >60 (low potassium addressed by HF clinic) 12/24/2022 Creatinine 0.94, BUN 17, Potassium 3.0, Sodium 136, GFR >60  12/19/2022 Creatinine 0.87, BUN 12, Potassium 3.6, Sodium 140, GFR >60  12/17/2022 Creatinine 1.01, BUN 17, Potassium 3.7, Sodium 137, GFR >60 12/15/2022 Creatinine 0.92, BUN 11, Potassium 3.9, Sodium 135, GFR >60  12/11/2022 Creatinine 0.81, BUN 12, Potassium 4.6, Sodium  139, GFR >60  12/09/2022 Creatinine 0.94, BUN 18, Potassium 4.8, Sodium 136, GFR >60  12/08/2022 Creatinine 1.18, BUN 22, Potassium 4.3, Sodium 133, GFR 55  12/07/2022 Creatinine 1.31, BUN 32, Potassium 2.3, Sodium 133, GFR 48  A complete set of results can be found in Results Review.   Recommendations: No changes and encouraged to call if experiencing any fluid symptoms.    Follow-up plan: ICM clinic phone appointment on 03/11/2023.   91 day device clinic remote transmission 04/19/2023.              EP/Cardiology next office visit:  Recall 04/07/2023 with Dr Gala Romney.          Copy of ICM check sent to Dr. Graciela Husbands.  3 Month HeartLogicT Heart Failure Index:    8 Day Data Trend:          Karie Soda, RN 02/13/2023 2:49 PM

## 2023-02-14 ENCOUNTER — Telehealth: Payer: Self-pay | Admitting: Gastroenterology

## 2023-02-14 NOTE — Telephone Encounter (Signed)
Patient w/hx crohns calls this afternoon stating that starting this morning, she had diarrheal stools x 2; stools were black and "almost like tar". No further episodes since. Patient denies any abdominal pain. Denies any brb. Feels she has been doing well on Entyvio until today. She denies any fever, SOB, dizziness, chest pain. Denies taking pepto bismol, kaopectate or oral iron recently. Does take Eliquis.   Dr Barron Alvine, any recommendations?

## 2023-02-14 NOTE — Telephone Encounter (Signed)
Patient called regarding crohn's and stated this time that she is having diarrhea and that it's black. Please advise

## 2023-02-15 ENCOUNTER — Ambulatory Visit: Payer: Medicaid Other | Admitting: Allergy

## 2023-02-15 DIAGNOSIS — Z419 Encounter for procedure for purposes other than remedying health state, unspecified: Secondary | ICD-10-CM | POA: Diagnosis not present

## 2023-02-15 NOTE — Telephone Encounter (Signed)
Patient denies any additional dark stools at this time; now just nauseated; taking phenergan for this. Advised for any recurrence of dark stool, we will order labs. Discussed ER precautions with patient and she verbalizes understanding.

## 2023-02-15 NOTE — Telephone Encounter (Signed)
Left message for patient to call back  

## 2023-02-15 NOTE — Telephone Encounter (Signed)
Lets keep a close eye on it.  If any recurrence of those dark stools, plan for CBC, ESR/CRP, BMP.  Of course if symptoms are increasing or any other concerns, provide ER return precautions.

## 2023-02-18 ENCOUNTER — Other Ambulatory Visit (HOSPITAL_COMMUNITY): Payer: Self-pay

## 2023-02-18 ENCOUNTER — Ambulatory Visit (HOSPITAL_COMMUNITY)
Admission: RE | Admit: 2023-02-18 | Discharge: 2023-02-18 | Disposition: A | Discharge status: Home / Self Care | Payer: Medicaid Other | Source: Ambulatory Visit | Attending: Cardiology | Admitting: Cardiology

## 2023-02-18 DIAGNOSIS — I5022 Chronic systolic (congestive) heart failure: Secondary | ICD-10-CM | POA: Insufficient documentation

## 2023-02-18 LAB — BASIC METABOLIC PANEL
Anion gap: 11 (ref 5–15)
BUN: 13 mg/dL (ref 6–20)
CO2: 28 mmol/L (ref 22–32)
Calcium: 9.1 mg/dL (ref 8.9–10.3)
Chloride: 102 mmol/L (ref 98–111)
Creatinine, Ser: 0.9 mg/dL (ref 0.44–1.00)
GFR, Estimated: >60 mL/min (ref >60)
Glucose, Bld: 76 mg/dL (ref 70–99)
Potassium: 3.4 mmol/L — ABNORMAL LOW (ref 3.5–5.1)
Sodium: 141 mmol/L (ref 135–145)

## 2023-02-18 MED ORDER — SPIRONOLACTONE 50 MG PO TABS
50.0000 mg | ORAL_TABLET | Freq: Every day | ORAL | 6 refills | Status: DC
  Filled 2023-02-18: qty 30, 30d supply, fill #0

## 2023-02-19 ENCOUNTER — Other Ambulatory Visit (HOSPITAL_COMMUNITY): Payer: Self-pay

## 2023-02-21 ENCOUNTER — Other Ambulatory Visit (HOSPITAL_COMMUNITY): Payer: Self-pay

## 2023-02-21 ENCOUNTER — Encounter: Payer: Self-pay | Admitting: Internal Medicine

## 2023-02-21 ENCOUNTER — Ambulatory Visit: Payer: Medicaid Other | Attending: Internal Medicine | Admitting: Internal Medicine

## 2023-02-21 VITALS — BP 87/59 | HR 78 | Resp 16 | Ht 70.0 in | Wt 180.0 lb

## 2023-02-21 DIAGNOSIS — D751 Secondary polycythemia: Secondary | ICD-10-CM | POA: Diagnosis not present

## 2023-02-21 DIAGNOSIS — I7300 Raynaud's syndrome without gangrene: Secondary | ICD-10-CM

## 2023-02-21 DIAGNOSIS — K50118 Crohn's disease of large intestine with other complication: Secondary | ICD-10-CM | POA: Diagnosis not present

## 2023-02-21 DIAGNOSIS — R21 Rash and other nonspecific skin eruption: Secondary | ICD-10-CM | POA: Diagnosis not present

## 2023-02-21 DIAGNOSIS — L509 Urticaria, unspecified: Secondary | ICD-10-CM

## 2023-02-21 DIAGNOSIS — K5000 Crohn's disease of small intestine without complications: Secondary | ICD-10-CM

## 2023-02-21 DIAGNOSIS — K56699 Other intestinal obstruction unspecified as to partial versus complete obstruction: Secondary | ICD-10-CM

## 2023-02-21 NOTE — Progress Notes (Signed)
Office Visit Note  Patient: Christine [name]             Date of Birth: May 16, 1965           MRN: [id number]             PCP: Christine Matar, MD Referring: Christine Cleverly, DO Visit Date: 02/21/2023   Subjective:  New Patient (Initial Visit)   History of Present Illness: Christine [name] is a 57 y.o. female here for evaluation of raynaud's symptoms with a history of diffuse rashes and complicated crohn's disease on entyvio.  Original Crohn's starting May 2023 with loose watery stools found to have ileitis and distal ileum stricture.  Does have significant medical history including congestive heart failure (LVEF < 20%). She has been dealing with diffuse rashes ongoing since around March of this year.  These have broken out in several different areas but the most prominent being a large flat erythematous patch on the left thigh lasting for hours at a time.  Primarily itching sometimes with burning sensation.  Did not notice any difference in symptoms before versus after starting Entyvio treatment.  She did not see any resolution with prednisone.  So far she is try to treat treatments including loratadine and Benadryl were not helpful for the visible rash or the itching.  She did see dermatology for a skin biopsy apparently this indicated some type of inflammation and was prescribed topical triamcinolone as a treatment.  Was not recommended any other systemic therapy and she is not sure about specific diagnosis.  She did have some lab test including antibody test with negative Sjogren syndrome markers. Besides these rashes she has been seeing increased frequency of toe discoloration becoming blue with numbness and tingling sensation.  Not associated with any visible ulcers or pitting. Also noted to have mild eosinophilia in [name] labs and appointment with allergy clinic in December.   Activities of Daily Living:  Patient Denies nocturnal pain.  Difficulty dressing/grooming:  Denies Difficulty climbing stairs: Denies Difficulty getting out of chair: Denies Difficulty using hands for taps, buttons, cutlery, and/or writing: Denies  Review of Systems  Constitutional:  Positive for fatigue.  HENT:  Positive for mouth sores and mouth dryness.   Eyes: Negative.  Negative for dryness.  Respiratory:  Positive for shortness of breath.   Cardiovascular: Negative.  Negative for chest pain and palpitations.  Gastrointestinal:  Positive for constipation and diarrhea.  Endocrine: Negative.  Negative for increased urination.  Genitourinary: Negative.  Negative for involuntary urination.  Musculoskeletal: Negative.  Negative for joint pain, gait problem, joint pain, joint swelling, myalgias, muscle weakness, morning stiffness, muscle tenderness and myalgias.  Skin:  Positive for rash. Negative for color change, hair loss and sensitivity to sunlight.  Allergic/Immunologic: Negative.  Negative for susceptible to infections.  Neurological:  Positive for dizziness and headaches.  Hematological: Negative.  Negative for swollen glands.  Psychiatric/Behavioral:  Positive for depressed mood. Negative for sleep disturbance. The patient is nervous/anxious.     PMFS History:  Patient Active Problem List   Diagnosis Date Noted   Crohn's disease involving terminal ileum (HCC) 07/23/2022   Crohn's disease of large intestine with other complication (HCC) 05/11/2022   Mild major depression (HCC) 05/11/2022   Paroxysmal tachycardia, unspecified (HCC) 05/11/2022   Essential hypertension 03/02/2022   Acute pancreatitis 03/01/2022   Hypokalemia 03/01/2022   Ileitis 03/01/2022   Diarrhea 02/26/2022   Nausea without vomiting 02/26/2022   Loss of weight 02/26/2022   Stenosis  of ileum (HCC) 02/26/2022   Ulcer of ileum 02/26/2022   Diverticulosis of colon without hemorrhage 02/26/2022   Cardiomyopathy (HCC) ischemic and non-ischemic 01/16/2022   Abdominal aortic aneurysm (AAA) 3.0 cm to  5.0 cm in diameter in female (HCC) 01/09/2022   Polycythemia 01/09/2022   Chronic combined systolic and diastolic heart failure (HCC) 08/24/2021   CAD in native artery 08/24/2021   S/P ICD (internal cardiac defibrillator) procedure 08/10/2021   Obstructive sleep apnea 05/05/2021   Former smoker 02/06/2021   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   Obesity (BMI 30.0-34.9) 02/06/2021   Anxiety 01/20/2021   Tobacco abuse 01/20/2021   Shortness of breath 01/20/2021   Chest tightness     Past Medical History:  Diagnosis Date   Allergy    Anxiety 01/20/2021   CAD in native artery 08/24/2021   CHF (congestive heart failure) (HCC)    Chronic combined systolic and diastolic heart failure (HCC) 08/24/2021   Complication of anesthesia    Crohn's colitis (HCC)    Depression    Hyperlipidemia    Myocardial infarction (HCC)    Presence of cardiac defibrillator 07/2021   Shortness of breath 01/20/2021   Sleep apnea 07/2021   Tobacco abuse 01/20/2021   Urticaria     Family History  Problem Relation Age of Onset   Eczema Mother    Stroke Mother    Atrial fibrillation Mother    Heart failure Father    Asthma Brother    Stomach cancer Maternal Grandfather    Liver cancer Neg Hx    Esophageal cancer Neg Hx    Colon polyps Neg Hx    Colon cancer Neg Hx    Allergic rhinitis Neg Hx    Angioedema Neg Hx    Urticaria Neg Hx    Past Surgical History:  Procedure Laterality Date   ABDOMINAL HYSTERECTOMY     BIOPSY  02/26/2022   Procedure: BIOPSY;  Surgeon: Christine Cleverly, DO;  Location: [facility];  Service: Gastroenterology;;   CARDIAC CATHETERIZATION     CHOLECYSTECTOMY     COLONOSCOPY WITH PROPOFOL N/A 02/26/2022   Procedure: COLONOSCOPY WITH PROPOFOL;  Surgeon: Christine Cleverly, DO;  Location: [facility];  Service: Gastroenterology;  Laterality: N/A;   ESOPHAGOGASTRODUODENOSCOPY (EGD) WITH PROPOFOL N/A 02/26/2022   Procedure: ESOPHAGOGASTRODUODENOSCOPY (EGD) WITH PROPOFOL;   Surgeon: Christine Cleverly, DO;  Location: [facility];  Service: Gastroenterology;  Laterality: N/A;   ICD IMPLANT N/A 07/21/2021   Procedure: ICD IMPLANT;  Surgeon: Christine Salvia, MD;  Location: [facility] INVASIVE CV LAB;  Service: Cardiovascular;  Laterality: N/A;   NECK SURGERY     OVARIAN CYST SURGERY     POLYPECTOMY  02/26/2022   Procedure: POLYPECTOMY;  Surgeon: Christine Cleverly, DO;  Location: [facility];  Service: Gastroenterology;;   RIGHT/LEFT HEART CATH AND CORONARY ANGIOGRAPHY N/A 01/23/2021   Procedure: RIGHT/LEFT HEART CATH AND CORONARY ANGIOGRAPHY;  Surgeon: Dolores Patty, MD;  Location: [facility] INVASIVE CV LAB;  Service: Cardiovascular;  Laterality: N/A;   TONSILLECTOMY     Social History   Social History Narrative   Not on file   Immunization History  Administered Date(s) Administered   Hepb-cpg 10/04/2022, 11/06/2022   Influenza, Seasonal, Injecte, Preservative Fre 01/15/2023   Influenza,inj,Quad PF,6+ Mos 02/06/2021, 01/09/2022   PFIZER(Purple Top)SARS-COV-2 Vaccination 08/29/2019, 09/23/2019, 04/02/2020   PNEUMOCOCCAL CONJUGATE-20 02/06/2021   Tdap 09/05/2021   Zoster Recombinant(Shingrix) 05/11/2022, 01/29/2023     Objective: Vital Signs: BP (!) 87/59 (BP Location: Right Arm,  Patient Position: Sitting, Cuff Size: Normal)   Pulse 78   Resp 16   Ht 5\' 10"  (1.778 m)   Wt 180 lb (81.6 kg)   BMI 25.83 kg/m    Physical Exam HENT:     Mouth/Throat:     Mouth: Mucous membranes are moist.     Pharynx: Oropharynx is clear.  Eyes:     Conjunctiva/sclera: Conjunctivae normal.  Cardiovascular:     Rate and Rhythm: Normal rate and regular rhythm.  Pulmonary:     Effort: Pulmonary effort is normal.     Breath sounds: Normal breath sounds.  Lymphadenopathy:     Cervical: No cervical adenopathy.  Skin:    General: Skin is warm and dry.     Findings: Rash present.     Comments: Round excoriated spots on arms and legs, no induration no wheals Normal  appearing  nailfold capillaroscopy No digital pitting  Neurological:     Mental Status: She is alert.  Psychiatric:        Mood and Affect: Mood normal.      Musculoskeletal Exam:  Shoulders full ROM no tenderness or swelling Elbows full ROM no tenderness or swelling Wrists full ROM no tenderness or swelling Fingers full ROM no tenderness or swelling Knees full ROM no tenderness or swelling Ankles full ROM no tenderness or swelling   Investigation: No additional findings.  Imaging: CUP PACEART INCLINIC DEVICE CHECK Result Date: 03/21/2023 ICD check in clinic. Normal device function. Thresholds and sensing consistent with previous device measurements. Impedance trends stable over time.  No ventricular arrhythmias. Histogram distribution appropriate for patient and level of activity. No changes made this session. Device programmed at appropriate safety margins. Device programmed to optimize intrinsic conduction. Estimated longevity __13 years__. Pt enrolled in remote follow-up   Recent Labs: Lab Results  Component Value Date   WBC 8.5 03/25/2023   HGB 17.7 (H) 03/25/2023   PLT 425 03/25/2023   NA 136 03/29/2023   K 3.2 (L) 03/29/2023   CL 93 (L) 03/29/2023   CO2 33 (H) 03/29/2023   GLUCOSE 94 03/29/2023   BUN 26 (H) 03/29/2023   CREATININE 1.22 (H) 03/29/2023   BILITOT 0.4 03/25/2023   ALKPHOS 165 (H) 03/25/2023   AST 21 03/25/2023   ALT 23 03/25/2023   PROT 8.5 03/25/2023   ALBUMIN 5.3 (H) 03/25/2023   CALCIUM 9.0 03/29/2023   GFRAA  03/31/2008    >60        The eGFR has been calculated using the MDRD equation. This calculation has not been validated in all clinical   QFTBGOLDPLUS NEGATIVE 07/10/2022    Speciality Comments: No specialty comments available.  Procedures:  No procedures performed Allergies: Gabapentin, Sulfa antibiotics, Erythromycin, Tramadol, Ibuprofen, and Tape   Assessment / Plan:     Visit Diagnoses: Raynaud's syndrome without gangrene -  Plan: Sedimentation rate, C3 and C4, RNP Antibody, Anti-DNA antibody, double-stranded, Beta-2 glycoprotein antibodies, Cardiolipin antibodies, IgG, IgM, IgA, C-reactive protein, Anti-Smith antibody, ANA  Intermittent digital cyanosis does not describe classic triphasic discoloration.  No characteristic nailfold capillary or finger or toenail changes on exam today.  Checking workup today including ANA titer and pattern with limited extractable nuclear antibodies, antiphospholipid antibodies, and serum inflammatory markers.  Blood pressure is already very soft on multidrug regimen for congestive heart failure so would limit any symptoms specific add-on treatment.  Crohn's disease involving terminal ileum (HCC) Crohn's disease of large intestine with other complication (HCC) 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HCC)  Crohn's disease appears to be responding well on the [facility] treatment.  Peripheral vascular disease not a common complication of crohn's ileitis.  I am not sure that the rash is associated since has not changed before or after starting treatment.  Rash and other nonspecific skin eruption Hives - Plan: Chronic Urticaria index panel  Round episodic erythematous rashes with intense itching and previous high eosinophilia consider urticaria or other histamine mediated process.  Will check CU index panel.  She already has an appointment with allergy.  Orders: Orders Placed This Encounter  Procedures   Sedimentation rate   C3 and C4   RNP Antibody   Anti-DNA antibody, double-stranded   Beta-2 glycoprotein antibodies   Cardiolipin antibodies, IgG, IgM, IgA   C-reactive protein   Anti-Smith antibody   Chronic Urticaria index panel   ANA   Anti-nuclear ab-titer (ANA titer)   No orders of the defined types were placed in this encounter.    Follow-Up Instructions: Return in about 4 weeks (around 03/21/2023) for New pt ?raynaud.   Fuller Plan, MD  Note - This record has been created using  [employer].  Chart creation errors have been sought, but may not always  have been located. Such creation errors do not reflect on  the standard of medical care.

## 2023-02-24 ENCOUNTER — Other Ambulatory Visit: Payer: Self-pay | Admitting: Internal Medicine

## 2023-02-24 DIAGNOSIS — E119 Type 2 diabetes mellitus without complications: Secondary | ICD-10-CM

## 2023-02-25 ENCOUNTER — Other Ambulatory Visit (HOSPITAL_COMMUNITY): Payer: Self-pay

## 2023-02-25 ENCOUNTER — Other Ambulatory Visit: Payer: Self-pay

## 2023-02-26 ENCOUNTER — Other Ambulatory Visit (HOSPITAL_COMMUNITY): Payer: Self-pay

## 2023-02-26 MED ORDER — SITAGLIPTIN PHOSPHATE 50 MG PO TABS
50.0000 mg | ORAL_TABLET | Freq: Every day | ORAL | 0 refills | Status: DC
  Filled 2023-02-26: qty 90, 90d supply, fill #0

## 2023-02-26 NOTE — Telephone Encounter (Signed)
Requested Prescriptions  Pending Prescriptions Disp Refills   sitaGLIPtin (JANUVIA) 50 MG tablet 90 tablet 0    Sig: Take 1 tablet (50 mg total) by mouth daily.     Endocrinology:  Diabetes - DPP-4 Inhibitors Passed - 02/24/2023 12:04 PM      Passed - HBA1C is between 0 and 7.9 and within 180 days    HbA1c, POC (controlled diabetic range)  Date Value Ref Range Status  11/02/2022 7.0 0.0 - 7.0 % Final   Hgb A1c MFr Bld  Date Value Ref Range Status  12/08/2022 7.1 (H) 4.8 - 5.6 % Final    Comment:    (NOTE) Pre diabetes:          5.7%-6.4%  Diabetes:              >6.4%  Glycemic control for   <7.0% adults with diabetes          Passed - Cr in normal range and within 360 days    Creatinine  Date Value Ref Range Status  12/25/2021 0.90 0.44 - 1.00 mg/dL Final   Creatinine, Ser  Date Value Ref Range Status  02/18/2023 0.90 0.44 - 1.00 mg/dL Final   Creatinine, Urine  Date Value Ref Range Status  12/08/2022 48 mg/dL Final         Passed - Valid encounter within last 6 months    Recent Outpatient Visits           1 month ago Type 2 diabetes mellitus with other specified complication, without long-term current use of insulin (HCC)   [facility] Of [place]. [facility] Marcine Matar, MD   3 months ago Injury of great toenail   [facility] [place] [facility] Dept Of [place]. [facility] Jonah Blue B, MD   5 months ago Coronary artery disease involving native coronary artery of native heart without angina pectoris   [place] [facility] [place] - A Dept Of [place]. [facility] Jonah Blue B, MD   9 months ago 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, NYHA class 2 [facility])   [facility] - [facility] Dept Of [place]. [facility] Marcine Matar, MD   11 months ago Leg cramps   [facility] [place] - A Dept Of . [facility] Marcine Matar, MD       Future Appointments             In 1 month Rice, Jamesetta Orleans, MD [facility] - A Dept Of [place]a [facility]ideg[facility]. [facility]   In 2 months Laural Benes, Binnie Rail, MD [facility] [place] - A Dept Of [place] [facility]. [facility]

## 2023-02-27 ENCOUNTER — Encounter (HOSPITAL_COMMUNITY): Payer: Self-pay | Admitting: Internal Medicine

## 2023-02-28 ENCOUNTER — Ambulatory Visit (HOSPITAL_COMMUNITY)
Admission: RE | Admit: 2023-02-28 | Discharge: 2023-02-28 | Disposition: A | Discharge status: Home / Self Care | Payer: Medicaid Other | Source: Ambulatory Visit | Attending: Gastroenterology | Admitting: Gastroenterology

## 2023-02-28 DIAGNOSIS — K5010 Crohn's disease of large intestine without complications: Secondary | ICD-10-CM | POA: Insufficient documentation

## 2023-02-28 MED ORDER — VEDOLIZUMAB 300 MG IV SOLR
300.0000 mg | INTRAVENOUS | Status: DC
  Administered 2023-02-28: 300 mg via INTRAVENOUS
  Filled 2023-02-28: qty 5

## 2023-03-01 ENCOUNTER — Telehealth (HOSPITAL_COMMUNITY): Payer: Self-pay

## 2023-03-01 NOTE — Telephone Encounter (Signed)
Late entry patient advised, med list updated to reflect changes, lab appt scheduled

## 2023-03-01 NOTE — Telephone Encounter (Signed)
error 

## 2023-03-04 ENCOUNTER — Other Ambulatory Visit (HOSPITAL_COMMUNITY): Payer: Medicaid Other

## 2023-03-05 DIAGNOSIS — I5022 Chronic systolic (congestive) heart failure: Secondary | ICD-10-CM | POA: Diagnosis not present

## 2023-03-06 ENCOUNTER — Ambulatory Visit (HOSPITAL_COMMUNITY)
Admission: RE | Admit: 2023-03-06 | Discharge: 2023-03-06 | Disposition: A | Discharge status: Home / Self Care | Payer: Medicaid Other | Source: Ambulatory Visit | Attending: Cardiology | Admitting: Cardiology

## 2023-03-06 ENCOUNTER — Other Ambulatory Visit (HOSPITAL_COMMUNITY): Payer: Self-pay | Admitting: Family Medicine

## 2023-03-06 ENCOUNTER — Other Ambulatory Visit: Payer: Self-pay

## 2023-03-06 DIAGNOSIS — I5022 Chronic systolic (congestive) heart failure: Secondary | ICD-10-CM | POA: Insufficient documentation

## 2023-03-06 LAB — BETA-2 GLYCOPROTEIN ANTIBODIES
Beta-2 Glyco 1 IgA: 4.9 U/mL (ref <20.0)
Beta-2 Glyco 1 IgM: 16.9 U/mL (ref <20.0)
Beta-2 Glyco I IgG: <2 U/mL (ref <20.0)

## 2023-03-06 LAB — CARDIOLIPIN ANTIBODIES, IGG, IGM, IGA
Anticardiolipin IgA: 3 [APL'U]/mL (ref <20.0)
Anticardiolipin IgG: 2.1 [GPL'U]/mL (ref <20.0)
Anticardiolipin IgM: 17.5 [MPL'U]/mL (ref <20.0)

## 2023-03-06 LAB — SEDIMENTATION RATE: Sed Rate: 28 mm/h (ref 0–30)

## 2023-03-06 LAB — ANTI-NUCLEAR AB-TITER (ANA TITER)
ANA TITER: 1:80 {titer} — ABNORMAL HIGH
ANA Titer 1: 1:80 {titer} — ABNORMAL HIGH

## 2023-03-06 LAB — BASIC METABOLIC PANEL
Anion gap: 9 (ref 5–15)
BUN: 15 mg/dL (ref 6–20)
CO2: 28 mmol/L (ref 22–32)
Calcium: 9.2 mg/dL (ref 8.9–10.3)
Chloride: 102 mmol/L (ref 98–111)
Creatinine, Ser: 1.13 mg/dL — ABNORMAL HIGH (ref 0.44–1.00)
GFR, Estimated: 57 mL/min — ABNORMAL LOW (ref >60)
Glucose, Bld: 106 mg/dL — ABNORMAL HIGH (ref 70–99)
Potassium: 4.1 mmol/L (ref 3.5–5.1)
Sodium: 139 mmol/L (ref 135–145)

## 2023-03-06 LAB — ANTI-SMITH ANTIBODY: ENA SM Ab Ser-aCnc: <1 AI

## 2023-03-06 LAB — CP CHRONIC URTICARIA INDEX PANEL
Histamine Release: <16 % (ref <16)
TSH: 1.3 m[IU]/L (ref 0.40–4.50)
Thyroglobulin Ab: <1 [IU]/mL (ref <=1)
Thyroperoxidase Ab SerPl-aCnc: 1 [IU]/mL (ref <9)

## 2023-03-06 LAB — RNP ANTIBODY: Ribonucleic Protein(ENA) Antibody, IgG: 3.2 AI — AB

## 2023-03-06 LAB — C3 AND C4
C3 Complement: 192 mg/dL (ref 83–193)
C4 Complement: 34 mg/dL (ref 15–57)

## 2023-03-06 LAB — ANA: Anti Nuclear Antibody (ANA): POSITIVE — AB

## 2023-03-06 LAB — ANTI-DNA ANTIBODY, DOUBLE-STRANDED: ds DNA Ab: <1 [IU]/mL

## 2023-03-06 LAB — C-REACTIVE PROTEIN: CRP: 3.1 mg/L (ref <8.0)

## 2023-03-07 ENCOUNTER — Other Ambulatory Visit: Payer: Self-pay

## 2023-03-07 ENCOUNTER — Other Ambulatory Visit (HOSPITAL_COMMUNITY): Payer: Self-pay

## 2023-03-07 ENCOUNTER — Encounter (HOSPITAL_COMMUNITY): Payer: Self-pay | Admitting: Internal Medicine

## 2023-03-07 MED ORDER — FUROSEMIDE 20 MG PO TABS
ORAL_TABLET | ORAL | 3 refills | Status: DC
Start: 2023-03-07 — End: 2023-03-19
  Filled 2023-03-07 – 2023-03-17 (×5): qty 240, 40d supply, fill #0
  Filled 2023-03-18: qty 180, 30d supply, fill #0

## 2023-03-07 MED ORDER — FUROSEMIDE 20 MG PO TABS
40.0000 mg | ORAL_TABLET | Freq: Two times a day (BID) | ORAL | 3 refills | Status: DC
  Filled 2023-03-07: qty 120, 30d supply, fill #0

## 2023-03-07 MED ORDER — POTASSIUM CHLORIDE CRYS ER 20 MEQ PO TBCR
100.0000 meq | EXTENDED_RELEASE_TABLET | Freq: Three times a day (TID) | ORAL | 3 refills | Status: DC
  Filled 2023-03-07: qty 450, 30d supply, fill #0

## 2023-03-08 ENCOUNTER — Other Ambulatory Visit (HOSPITAL_COMMUNITY): Payer: Self-pay

## 2023-03-11 ENCOUNTER — Other Ambulatory Visit: Payer: Self-pay

## 2023-03-11 ENCOUNTER — Ambulatory Visit: Payer: Medicaid Other | Attending: Internal Medicine

## 2023-03-11 ENCOUNTER — Other Ambulatory Visit (HOSPITAL_COMMUNITY): Payer: Self-pay

## 2023-03-11 DIAGNOSIS — I5022 Chronic systolic (congestive) heart failure: Secondary | ICD-10-CM | POA: Diagnosis not present

## 2023-03-11 DIAGNOSIS — Z95810 Presence of automatic (implantable) cardiac defibrillator: Secondary | ICD-10-CM | POA: Diagnosis not present

## 2023-03-12 NOTE — Progress Notes (Signed)
EPIC Encounter for ICM Monitoring  Patient Name: Christine [name] is a 57 y.o. female Date: 03/12/2023 Primary Care Physican: Marcine Matar, MD Primary Cardiologist: [place] Electrophysiologist: Graciela Husbands Last Weight: 185.5 lbs 12/19/2022 Weight: 179 lbs (baseline 177 lbs) 02/06/2023 Weight: 182.5 lbs 02/13/2023 Weight: 181 lbs  03/12/2023 Weight: 183 lbs (baseline 177 lbs)                                                            Spoke with patient and heart failure questions reviewed.  Transmission results reviewed.  Pt feels better since taking increased Lasix dosage but weight still remains about 5 lbs over baseline.         HeartLogic Heart Failure Index is 0 suggesting fluid levels are within normal threshold range.    Prescribed:  Furosemide 20 mg take 4 tablet(s) (80 mg total) by mouth every morning and 2 tablet(s) (40 mg total) every evening Potassium 20 mEq take 5 tablet(s) (100 mEq total) by mouth three times daily Metolazone 2.5 mg take 1 tablet by mouth as directed.  Take additional 40 mEq of Potassium with each dose Spironolactone 25 mg take 1 tablet by mouth daily.    Labs:   03/18/2023 BMET Scheduled 03/06/2023 Creatinine 1.13, BUN 15, Potassium 4.1, Sodium 139, GFR 57 02/18/2023 Creatinine 0.90, BUN 13, Potassium 3.4, Sodium 141, GFR >60 02/04/2023 Creatinine 0.99, BUN 16, Potassium 4.0, Sodium 137, GFR >60  01/30/2023 Creatinine 1.24, BUN 37, Potassium 3.8, Sodium 133, GFR 51  01/21/2023 Creatinine 1.14, BUN 21, Potassium 3.6, Sodium 133, GFR 57  01/14/2023 Creatinine 1.24, BUN 26, Potassium 2.8, Sodium 134, GFR 51 01/11/2023 Creatinine 1.37, BUN 29, Potassium 2.4, Sodium 133, GFR 46 12/27/2022 Creatinine 1.08, BUN 14, Potassium 3.4, Sodium 136, GFR >60 (low potassium addressed by HF clinic) A complete set of results can be found in Results Review.   Recommendations:  No changes and encouraged to call if experiencing any fluid symptoms.   Follow-up  plan: ICM clinic phone appointment on 04/15/2023.   91 day device clinic remote transmission 04/19/2023.              EP/Cardiology next office visit:  Recall 05/21/2023 with Dr Gala Romney.  Provided EP scheduler number and advised to call for overdue appt with Dr Graciela Husbands.  Last OV with Dr Graciela Husbands was 02/07/2022          Copy of ICM check sent to Dr. Graciela Husbands.  3 month ICM trend: 03/12/2023.    12-14 Month ICM trend:     Karie Soda, RN 03/12/2023 7:39 AM

## 2023-03-15 ENCOUNTER — Other Ambulatory Visit: Payer: Self-pay | Admitting: Gastroenterology

## 2023-03-15 ENCOUNTER — Other Ambulatory Visit (HOSPITAL_COMMUNITY): Payer: Self-pay

## 2023-03-15 ENCOUNTER — Other Ambulatory Visit: Payer: Self-pay

## 2023-03-16 ENCOUNTER — Other Ambulatory Visit: Payer: Self-pay

## 2023-03-17 ENCOUNTER — Other Ambulatory Visit (HOSPITAL_COMMUNITY): Payer: Self-pay

## 2023-03-17 DIAGNOSIS — Z419 Encounter for procedure for purposes other than remedying health state, unspecified: Secondary | ICD-10-CM | POA: Diagnosis not present

## 2023-03-18 ENCOUNTER — Telehealth: Payer: Self-pay

## 2023-03-18 ENCOUNTER — Other Ambulatory Visit: Payer: Self-pay

## 2023-03-18 ENCOUNTER — Ambulatory Visit (HOSPITAL_COMMUNITY)
Admission: RE | Admit: 2023-03-18 | Discharge: 2023-03-18 | Disposition: A | Discharge status: Home / Self Care | Payer: Medicaid Other | Source: Ambulatory Visit | Attending: Cardiology | Admitting: Cardiology

## 2023-03-18 ENCOUNTER — Other Ambulatory Visit (HOSPITAL_COMMUNITY): Payer: Self-pay

## 2023-03-18 ENCOUNTER — Encounter (HOSPITAL_COMMUNITY): Payer: Self-pay | Admitting: Internal Medicine

## 2023-03-18 DIAGNOSIS — I5022 Chronic systolic (congestive) heart failure: Secondary | ICD-10-CM | POA: Insufficient documentation

## 2023-03-18 LAB — BASIC METABOLIC PANEL
Anion gap: 9 (ref 5–15)
BUN: 10 mg/dL (ref 6–20)
CO2: 28 mmol/L (ref 22–32)
Calcium: 9.3 mg/dL (ref 8.9–10.3)
Chloride: 103 mmol/L (ref 98–111)
Creatinine, Ser: 0.96 mg/dL (ref 0.44–1.00)
GFR, Estimated: >60 mL/min (ref >60)
Glucose, Bld: 108 mg/dL — ABNORMAL HIGH (ref 70–99)
Potassium: 3.6 mmol/L (ref 3.5–5.1)
Sodium: 140 mmol/L (ref 135–145)

## 2023-03-18 NOTE — Telephone Encounter (Signed)
Per Jacki Cones Pt unable to send transmission until after hours Reports weight is up 8-10 lbs from baseline

## 2023-03-18 NOTE — Telephone Encounter (Signed)
Spoke with patient.  Advised I received a message from HF clinic this afternoon about getting a manual remote transmission for fluid levels review.  Pt reports sending my chart message to HF clinic due reporting weight is 187 lbs today and that is approximately 9-10 lbs over baseline weight.  She is SOB and very fatigued.  Asked if she needs assistance to send remote transmission and she stated no.  She is not at home and will not be able to send remote transmission until about 8:30 PM tonight.  Advised I will review report in the morning and will inform HF clinic the report cannot be sent today.  She appreciated the call and will wait for HF clinic to provide her with recommendations.  She is currently taking Furosemide 80 mg AM and 40 mg PM but feels the 80 mg twice a day helps to prevent fluid buildup.     Message to [facility] regarding patient unable to send manual transmission until later tonight after office hours.

## 2023-03-18 NOTE — Telephone Encounter (Signed)
OV 10/7- Reports NYHA II-early III symptoms, but objective findings are stable. Volume stable on exam and by device interrogation  Continue Lasix 40 mg bid + 80 KCL tid  Continue spiro 25 mg daily.   Triage call 10/24 Called to report SOB Metolazone x 1 with extra 40 KCL ordered    mYCHART MESSAGE 11/13  -WEIGHT ELEVATED despite metolazone Lasix increased to 80/40 KCL 100 tid  Remote monitoring 11/25 HeartLogic Heart Failure Index is 0 suggesting fluid levels are within normal threshold range.    Please advise

## 2023-03-19 ENCOUNTER — Other Ambulatory Visit: Payer: Self-pay

## 2023-03-19 ENCOUNTER — Other Ambulatory Visit (HOSPITAL_COMMUNITY): Payer: Self-pay

## 2023-03-19 MED ORDER — POTASSIUM CHLORIDE CRYS ER 20 MEQ PO TBCR
EXTENDED_RELEASE_TABLET | ORAL | 3 refills | Status: DC
Start: 2023-03-19 — End: 2023-04-12
  Filled 2023-03-19: qty 510, 30d supply, fill #0

## 2023-03-19 MED ORDER — FUROSEMIDE 20 MG PO TABS
80.0000 mg | ORAL_TABLET | Freq: Two times a day (BID) | ORAL | 3 refills | Status: DC
  Filled 2023-03-19 – 2023-04-03 (×3): qty 240, 30d supply, fill #0
  Filled 2023-04-18: qty 240, 30d supply, fill #1
  Filled 2023-05-08 (×2): qty 240, 30d supply, fill #2
  Filled 2023-05-31: qty 240, 30d supply, fill #3

## 2023-03-19 MED ORDER — PROMETHAZINE HCL 25 MG PO TABS
25.0000 mg | ORAL_TABLET | Freq: Four times a day (QID) | ORAL | 2 refills | Status: DC
  Filled 2023-03-19: qty 90, 23d supply, fill #0
  Filled 2023-04-14: qty 90, 23d supply, fill #1
  Filled 2023-05-07: qty 90, 23d supply, fill #2

## 2023-03-19 MED ORDER — PANCRELIPASE (LIP-PROT-AMYL) 36000-114000 UNITS PO CPEP
ORAL_CAPSULE | ORAL | 5 refills | Status: DC
Start: 2023-03-19 — End: 2023-05-22
  Filled 2023-03-19: qty 300, 34d supply, fill #0
  Filled 2023-05-17: qty 300, 34d supply, fill #1

## 2023-03-19 NOTE — Progress Notes (Unsigned)
Cardiology Office Note:  .   Date:  03/19/2023  ID:  Christine [name], DOB 09/16/1965, MRN [id number] PCP: Marcine Matar, MD  [facility] Providers Cardiologist:  Chilton Si, MD Electrophysiologist:  Sherryl Manges, MD  Advanced Heart Failure:  Arvilla Meres, MD  Sleep Medicine:  Armanda Magic, MD {  History of Present Illness: Christine [name]   [name] is a 57 y.o. 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 Graciela Husbands 02/07/22, described class II symptoms, , felt to have some volume OL, diuretic pulsed.  Pt reported fatiugue sounding somewhat chronic, stopped her corlanor and deferred to HF team  Seeing Dr. Ned Card team regularly, and ICM Clinic RN, last was with HF APP 01/21/23,  She reports: -- Admitted (03/01/22) with CP and chronic diarrhea. Hstrop negative. Felt to have Crohn's disease. Losartan and spiro stopped due to low BP.  Following with GI (Dr. Bethann Berkshire)  -- Multiple ED visits for various issues.   -- Monitor in 12/23: Rare PVCs 2 episodes of brief SVT (4 beats)   -- Follow up 3/24, reported NYHA III symptoms but objective findings stable. Repeat echo (4/24) showed improved EF 35-40%, garde 1 DD, RV ok  -- Hospitalized 8/24 for hypokalemia. K 2.3. Repleted. Discharged next day.   -- Covid + 12/15/22. Completed Molnupiravir.   At the time of this visit doing OK< hypokalemia problematic, reported morning palpitations, rare metolazone use Off ARB 2/2 low BP High K+ dosing continued (suspected loss 2/2 her chrons/diarrhea)  Today's visit is scheduled as an annual visit  ROS:   She is feeling better of late, mentions finally got a dose of lasix/spiro, K+ that has gotten her settled and breathing better with reasonably good K+ level She tends to 1st see volume coming on on her hands, actually, not LE.   No CP, palpitations She is perhaps a bit more fatigued then  usual noting her BP 88/64 today, though denies any dizziness, lightheadedness No near syncope or syncope. No shocks or device concerns  She reports home/baseline BPs run 90's systolic  She battles her Chron's Getting evaluated by derm and rheum currently with some suspicion of Lyme's    Device information BSCi single chamber ICD implanted 07/21/21 Primary prevention  Arrhythmia/AAD hx No AAD to date that I find  Studies Reviewed: Christine [name]    EKG not done today  DEVICE interrogation done today and reviewed by myself Battery and lead measurements are good No arrhythmias No VP   01/20/21. Echo EF 20-25%, severely dilated LV, RV okay, moderate MR - R/LHC (10/22): 1v CAD occluded mid LAD PCWP 27,  Fick 4.7/2.  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 No viability. RV okay. Not sure how to explain inferior defects on cMRI  - Echo (05/11/21) EF < 20% RV ok - CPX 1/23 with very mild HF limitation:  Peak VO2: 18.8 (90% predicted peak VO2) VE/VCO2 slope: 33 Peak RER: 1.14  - s/p BosSCI ICD 4/23. - Echo (4/24): EF 35-40%, RV ok   Risk Assessment/Calculations:    Physical Exam:   VS:  There were no vitals taken for this visit.   Wt Readings from Last 3 Encounters:  02/28/23 178 lb (80.7 kg)  02/21/23 180 lb (81.6 kg)  01/21/23 178 lb 9.6 oz (81 kg)    GEN: Well nourished, well  developed in no acute distress NECK: No JVD; No carotid bruits CARDIAC: RRR, no murmurs, rubs, gallops RESPIRATORY:  CTA b/l without rales, wheezing or rhonchi  ABDOMEN: Soft, non-tender, non-distended EXTREMITIES: No edema; No deformity   ICD site: is stable, no thinning, fluctuation, tethering  ASSESSMENT AND PLAN: .    ICD Intact function No programming changes made  CAD No symptoms On BB, statin, no ASA w/Eliquis C/w Dr. Lynne Logan  ICM Chronic CHF No symptoms or exam findings of  volume OL Heart logic score is 2 Hypotensive, minimally if at all symptomatic I have reached out to the HF team for any recommendations , in the meantime, I advised she monitor her BP at home, and if persistently <90 systolic to call the HF clinic, or if she develops symptoms  Addend: Spoke to HF APP Advised to move her spironolactone to the evening and monitor BP I spoke to the patient, she will make the change. Francis Dowse, PA-C  LV thrombus on OAC Eliquis, appropriately dosed C/w Dr. Lynne Logan    Dispo: remotes as usual, in clinic in a year again with EP, sooner if needed  Signed, Sheilah Pigeon, PA-C

## 2023-03-19 NOTE — Telephone Encounter (Signed)
Transmission available See REMOTE TRANSMISSION NOTE 12/2 Thanks

## 2023-03-19 NOTE — Telephone Encounter (Signed)
Received Latitude remote transmission.    HeartLogic Heart Failure Index is 0 suggesting fluid levels are within normal threshold range but is NOT correlating with Thoracic impedance.    Thoracic impedance trending very low and suggesting possible fluid accumulation which may correlate with patient's symptoms.   Message sent to Michaelyn Barter, CMA at HF clinic regarding report that was requested for review.      3 month trend:

## 2023-03-21 ENCOUNTER — Other Ambulatory Visit (HOSPITAL_COMMUNITY): Payer: Self-pay

## 2023-03-21 ENCOUNTER — Ambulatory Visit: Payer: Medicaid Other | Attending: Physician Assistant | Admitting: Physician Assistant

## 2023-03-21 ENCOUNTER — Encounter: Payer: Self-pay | Admitting: Physician Assistant

## 2023-03-21 VITALS — BP 88/64 | HR 86 | Ht 70.0 in | Wt 181.0 lb

## 2023-03-21 DIAGNOSIS — I2510 Atherosclerotic heart disease of native coronary artery without angina pectoris: Secondary | ICD-10-CM

## 2023-03-21 DIAGNOSIS — Z95810 Presence of automatic (implantable) cardiac defibrillator: Secondary | ICD-10-CM | POA: Diagnosis not present

## 2023-03-21 DIAGNOSIS — I255 Ischemic cardiomyopathy: Secondary | ICD-10-CM | POA: Diagnosis not present

## 2023-03-21 DIAGNOSIS — I513 Intracardiac thrombosis, not elsewhere classified: Secondary | ICD-10-CM | POA: Diagnosis not present

## 2023-03-21 DIAGNOSIS — I5022 Chronic systolic (congestive) heart failure: Secondary | ICD-10-CM | POA: Diagnosis not present

## 2023-03-21 LAB — CUP PACEART INCLINIC DEVICE CHECK
Date Time Interrogation Session: 20241205143326
HighPow Impedance: 86 Ohm
Implantable Lead Connection Status: 753985
Implantable Lead Implant Date: 20230407
Implantable Lead Location: 753860
Implantable Lead Model: 138
Implantable Lead Serial Number: 303166
Implantable Pulse Generator Implant Date: 20230407
Lead Channel Impedance Value: 929 Ohm
Lead Channel Pacing Threshold Amplitude: 0.9 V
Lead Channel Pacing Threshold Pulse Width: 0.4 ms
Lead Channel Sensing Intrinsic Amplitude: 25 mV
Lead Channel Setting Pacing Amplitude: 2.5 V
Lead Channel Setting Pacing Pulse Width: 0.4 ms
Lead Channel Setting Sensing Sensitivity: 0.5 mV
Pulse Gen Serial Number: 216478
Zone Setting Status: 755011

## 2023-03-21 NOTE — Patient Instructions (Signed)
Medication Instructions:    Your physician recommends that you continue on your current medications as directed. Please refer to the Current Medication list given to you today.  *If you need a refill on your cardiac medications before your next appointment, please call your pharmacy*   Lab Work:  NONE ORDERED  TODAY   If you have labs (blood work) drawn today and your tests are completely normal, you will receive your results only by: MyChart Message (if you have MyChart) OR A paper copy in the mail If you have any lab test that is abnormal or we need to change your treatment, we will call you to review the results.   Testing/Procedures: NONE ORDERED  TODAY     Follow-Up: At [facility], you and your health needs are our priority.  As part of our continuing mission to provide you with exceptional heart care, we have created designated Provider Care Teams.  These Care Teams include your primary Cardiologist (physician) and Advanced Practice Providers (APPs -  Physician Assistants and Nurse Practitioners) who all work together to provide you with the care you need, when you need it.  We recommend signing up for the patient portal called "MyChart".  Sign up information is provided on this After Visit Summary.  MyChart is used to connect with patients for Virtual Visits (Telemedicine).  Patients are able to view lab/test results, encounter notes, upcoming appointments, etc.  Non-urgent messages can be sent to your provider as well.   To learn more about what you can do with MyChart, go to [email or web address].    Your next appointment:   1 year(s)  Provider:   You may see Steven Klein, MD or one of the following Advanced Practice Providers on your designated Care Team:   Renee Ursuy, PA-C  Other Instructions  

## 2023-03-22 ENCOUNTER — Other Ambulatory Visit (HOSPITAL_COMMUNITY): Payer: Self-pay

## 2023-03-22 MED ORDER — METOLAZONE 2.5 MG PO TABS
2.5000 mg | ORAL_TABLET | ORAL | 0 refills | Status: DC
  Filled 2023-03-22: qty 5, 5d supply, fill #0

## 2023-03-24 NOTE — Progress Notes (Unsigned)
New Patient Note  RE: Christine [name] MRN: [id number] DOB: 02/19/1966 Date of Office Visit: 03/25/2023  Consult requested by: Marcine Matar, MD Primary care provider: Marcine Matar, MD  Chief Complaint: No chief complaint on file.  History of Present Illness: I had the pleasure of seeing Christine [name] for initial evaluation at the [facility] on 03/24/2023. She is a 57 y.o. female, who is referred here by Marcine Matar, MD for the evaluation of pruritus.  Discussed the use of AI scribe software for clinical note transcription with the patient, who gave verbal consent to proceed.  History of Present Illness             Rash started about *** ago. Mainly occurs on her ***. Describes them as ***. Individual rashes lasts about ***. No ecchymosis upon resolution. Associated symptoms include: ***.  Frequency of episodes: ***. Suspected triggers are ***. Denies any *** fevers, chills, changes in medications, foods, personal care products or recent infections. She has tried the following therapies: *** with *** benefit. Systemic steroids ***. Currently on ***.  Previous work up includes: ***. Previous history of rash/hives: ***. Patient is up to date with the following cancer screening tests: ***.  01/15/2023 PCP visit: "4. Itching Stop Benadryl.  Advised to take Claritin daily.  Will refer to allergist to rule out any environmental allergies."  Assessment and Plan: Christine [name] is a 57 y.o. female with: ***  Assessment and Plan               No follow-ups on file.  No orders of the defined types were placed in this encounter.  Lab Orders  No laboratory test(s) ordered today    Other allergy screening: Asthma: {Blank single:19197::"yes","no"} Rhino conjunctivitis: {Blank single:19197::"yes","no"} Food allergy: {Blank single:19197::"yes","no"} Medication allergy: {Blank single:19197::"yes","no"} Hymenoptera allergy: {Blank  single:19197::"yes","no"} Urticaria: {Blank single:19197::"yes","no"} Eczema:{Blank single:19197::"yes","no"} History of recurrent infections suggestive of immunodeficency: {Blank single:19197::"yes","no"}  Diagnostics: Spirometry:  Tracings reviewed. Her effort: {Blank single:19197::"Good reproducible efforts.","It was hard to get consistent efforts and there is a question as to whether this reflects a maximal maneuver.","Poor effort, data can not be interpreted."} FVC: ***L FEV1: ***L, ***% predicted FEV1/FVC ratio: ***% Interpretation: {Blank single:19197::"Spirometry consistent with mild obstructive disease","Spirometry consistent with moderate obstructive disease","Spirometry consistent with severe obstructive disease","Spirometry consistent with possible restrictive disease","Spirometry consistent with mixed obstructive and restrictive disease","Spirometry uninterpretable due to technique","Spirometry consistent with normal pattern","No overt abnormalities noted given today's efforts"}.  Please see scanned spirometry results for details.  Skin Testing: {Blank single:19197::"Select foods","Environmental allergy panel","Environmental allergy panel and select foods","Food allergy panel","None","Deferred due to recent antihistamines use"}. *** Results discussed with patient/family.   Past Medical History: Patient Active Problem List   Diagnosis Date Noted  . Crohn's disease involving terminal ileum (HCC) 07/23/2022  . Crohn's disease of large intestine with other complication (HCC) 05/11/2022  . Mild major depression (HCC) 05/11/2022  . Paroxysmal tachycardia, unspecified (HCC) 05/11/2022  . Essential hypertension 03/02/2022  . Acute pancreatitis 03/01/2022  . Hypokalemia 03/01/2022  . Ileitis 03/01/2022  . Diarrhea 02/26/2022  . Nausea without vomiting 02/26/2022  . Loss of weight 02/26/2022  . 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  . Ulcer of ileum 02/26/2022  . Diverticulosis of  colon without hemorrhage 02/26/2022  . Cardiomyopathy (HCC) ischemic and non-ischemic 01/16/2022  . Abdominal aortic aneurysm (AAA) 3.0 cm to 5.0 cm in diameter in female (HCC) 01/09/2022  . Polycythemia 01/09/2022  . Chronic combined systolic and diastolic heart failure (  HCC) 08/24/2021  . CAD in native artery 08/24/2021  . S/P ICD (internal cardiac defibrillator) procedure 08/10/2021  . Obstructive sleep apnea 05/05/2021  . Former smoker 02/06/2021  . 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  . Obesity (BMI 30.0-34.9) 02/06/2021  . Anxiety 01/20/2021  . Tobacco abuse 01/20/2021  . Shortness of breath 01/20/2021  . Chest tightness    Past Medical History:  Diagnosis Date  . Allergy   . Anxiety 01/20/2021  . CAD in native artery 08/24/2021  . CHF (congestive heart failure) (HCC)   . Chronic combined systolic and diastolic heart failure (HCC) 08/24/2021  . Complication of anesthesia   . Crohn's colitis (HCC)   . Depression   . Hyperlipidemia   . Myocardial infarction (HCC)   . Presence of cardiac defibrillator 07/2021  . Shortness of breath 01/20/2021  . Sleep apnea 07/2021  . Tobacco abuse 01/20/2021   Past Surgical History: Past Surgical History:  Procedure Laterality Date  . ABDOMINAL HYSTERECTOMY    . BIOPSY  02/26/2022   Procedure: BIOPSY;  Surgeon: Shellia Cleverly, DO;  Location: [facility];  Service: Gastroenterology;;  . CARDIAC CATHETERIZATION    . CHOLECYSTECTOMY    . COLONOSCOPY WITH PROPOFOL N/A 02/26/2022   Procedure: COLONOSCOPY WITH PROPOFOL;  Surgeon: Shellia Cleverly, DO;  Location: [facility];  Service: Gastroenterology;  Laterality: N/A;  . ESOPHAGOGASTRODUODENOSCOPY (EGD) WITH PROPOFOL N/A 02/26/2022   Procedure: ESOPHAGOGASTRODUODENOSCOPY (EGD) WITH PROPOFOL;  Surgeon: Shellia Cleverly, DO;  Location: [facility];  Service: Gastroenterology;  Laterality: N/A;  . ICD IMPLANT N/A 07/21/2021   Procedure: ICD IMPLANT;  Surgeon: Duke Salvia,  MD;  Location: [facility] INVASIVE CV LAB;  Service: Cardiovascular;  Laterality: N/A;  . NECK SURGERY    . OVARIAN CYST SURGERY    . POLYPECTOMY  02/26/2022   Procedure: POLYPECTOMY;  Surgeon: Shellia Cleverly, DO;  Location: [facility];  Service: Gastroenterology;;  . RIGHT/LEFT HEART CATH AND CORONARY ANGIOGRAPHY N/A 01/23/2021   Procedure: RIGHT/LEFT HEART CATH AND CORONARY ANGIOGRAPHY;  Surgeon: Dolores Patty, MD;  Location: [facility] INVASIVE CV LAB;  Service: Cardiovascular;  Laterality: N/A;   Medication List:  Current Outpatient Medications  Medication Sig Dispense Refill  . Accu-Chek Softclix Lancets lancets Use as instructed.  Check blood sugar daily before breakfast. 100 each 12  . acetaminophen (TYLENOL) 500 MG tablet Take 1,000 mg by mouth 3 (three) times daily as needed for moderate pain or headache.    . ALPRAZolam (XANAX) 1 MG tablet Take 2 mg by mouth 3 (three) times daily.    Marland Kitchen alum & mag hydroxide-simeth (MAALOX/MYLANTA) 200-200-20 MG/5ML suspension Take 30 mLs by mouth every 4 (four) hours as needed for indigestion or heartburn. 355 mL 0  . apixaban (ELIQUIS) 5 MG TABS tablet Take 1 tablet (5 mg total) by mouth 2 (two) times daily. 60 tablet 11  . atorvastatin (LIPITOR) 80 MG tablet Take 1 tablet (80 mg total) by mouth daily. 90 tablet 3  . Blood Glucose Monitoring Suppl (ACCU-CHEK GUIDE) w/Device KIT Check blood sugar once daily before breakfast 1 kit 0  . buPROPion (WELLBUTRIN XL) 150 MG 24 hr tablet Take 150 mg by mouth daily.    . dapagliflozin propanediol (FARXIGA) 10 MG TABS tablet Take 1 tablet (10 mg total) by mouth daily. 30 tablet 9  . diphenoxylate-atropine (LOMOTIL) 2.5-0.025 MG tablet Take 2 tablets by mouth 4 (four) times daily as needed for diarrhea/loose stools. 60 tablet 3  . feeding supplement (ENSURE ENLIVE /  ENSURE PLUS) LIQD Take 237 mLs by mouth 2 (two) times daily between meals. 237 mL 12  . Fezolinetant (VEOZAH) 45 MG TABS Take 1 tablet (45 mg total) by  mouth daily. 30 tablet 2  . furosemide (LASIX) 20 MG tablet Take 4 tablets (80 mg total) by mouth 2 (two) times daily. 240 tablet 3  . glucose blood (ACCU-CHEK GUIDE) test strip Use as instructed to check blood sugar daily before breakfast 100 each 12  . ivabradine (CORLANOR) 5 MG TABS tablet Take 1/2 tablet (2.5 mg total) by mouth 2 (two) times daily. 30 tablet 5  . lipase/protease/amylase (CREON) 36000 UNITS CPEP capsule Take 2 capsules (72,000 Units total) by mouth with meals AND 1 capsule (36,000 Units total) with snacks. 300 capsule 5  . loratadine (CLARITIN) 10 MG tablet Take 1 tablet (10 mg total) by mouth daily. 90 tablet 1  . metolazone (ZAROXOLYN) 2.5 MG tablet Take 1 tablet (2.5 mg total) by mouth as directed. Take with additional 40 meq of potassium 5 tablet 0  . metoprolol succinate (TOPROL-XL) 25 MG 24 hr tablet Take 1/2 tablet (12.5 mg total) by mouth daily. 90 tablet 0  . metroNIDAZOLE (METROGEL) 1 % gel Apply 1 Application topically as needed (rash).    . Multiple Vitamins-Minerals (MULTIVITAMIN WITH MINERALS) tablet Take 1 tablet by mouth daily.    Marland Kitchen oxymetazoline (AFRIN) 0.05 % nasal spray Place 1-2 sprays into both nostrils 2 (two) times daily as needed for congestion.    . potassium chloride SA (KLOR-CON M) 20 MEQ tablet Take 6 tablets (120 mEq total) by mouth every morning AND 5 tablets (100 mEq total) daily at 12 noon AND 6 tablets (120 mEq total) every evening. 510 tablet 3  . prazosin (MINIPRESS) 2 MG capsule Take 2-6 mg by mouth daily as needed (for sleep).    . promethazine (PHENERGAN) 25 MG tablet Take 1 tablet (25 mg total) by mouth every 6 (six) to 8 (eight) hours for nausea or vomiting. (Patient taking differently: Take 25 mg by mouth every 6 (six) hours as needed for nausea or vomiting.) 90 tablet 2  . sitaGLIPtin (JANUVIA) 50 MG tablet Take 1 tablet (50 mg total) by mouth daily. 90 tablet 0  . spironolactone (ALDACTONE) 25 MG tablet Take 1 tablet (25 mg total) by mouth  daily. 90 tablet 3  . triamcinolone cream (KENALOG) 0.1 % Apply 1 Application (1 gram) topically 2 (two) times daily. 80 g 1  . vedolizumab (ENTYVIO) 300 MG injection Inject 300 mg into the vein every 2 (two) months.    . zolpidem (AMBIEN) 10 MG tablet Take 5-10 mg by mouth at bedtime.     No current facility-administered medications for this visit.   Allergies: Allergies  Allergen Reactions  . Gabapentin Diarrhea  . Sulfa Antibiotics Hives, Itching and Rash  . Erythromycin Hives  . Tramadol Rash and Other (See Comments)    Urinary retention    . Ibuprofen Itching  . Tape Rash   Social History: Social History   Socioeconomic History  . Marital status: Single    Spouse name: Not on file  . Number of children: 0  . Years of education: 16  . Highest education level: Bachelor's degree (e.g., BA, AB, BS)  Occupational History  . Not on file  Tobacco Use  . Smoking status: Former    Current packs/day: 0.00    Types: Cigarettes    Quit date: 01/15/2021    Years since quitting: 2.1  Passive exposure: Past  . Smokeless tobacco: Never  Vaping Use  . Vaping status: Never Used  Substance and Sexual Activity  . Alcohol use: Yes    Comment: rarely  . Drug use: Never  . Sexual activity: Not on file  Other Topics Concern  . Not on file  Social History Narrative  . Not on file   Social Determinants of Health   Financial Resource Strain: Medium Risk (12/13/2022)   Overall Financial Resource Strain (CARDIA)   . Difficulty of Paying Living Expenses: Somewhat hard  Food Insecurity: No Food Insecurity (12/13/2022)   Hunger Vital Sign   . Worried About Programme researcher, broadcasting/film/video in the Last Year: Never true   . Ran Out of Food in the Last Year: Never true  Transportation Needs: No Transportation Needs (12/13/2022)   PRAPARE - Transportation   . Lack of Transportation (Medical): No   . Lack of Transportation (Non-Medical): No  Physical Activity: Insufficiently Active (09/12/2022)    Exercise Vital Sign   . Days of Exercise per Week: 4 days   . Minutes of Exercise per Session: 20 min  Stress: No Stress Concern Present (09/12/2022)   [employer] of Occupational Health - Occupational Stress Questionnaire   . Feeling of Stress : Only a little  Social Connections: Moderately Isolated (09/12/2022)   Social Connection and Isolation Panel [NHANES]   . Frequency of Communication with Friends and Family: Twice a week   . Frequency of Social Gatherings with Friends and Family: Once a week   . Attends Religious Services: 1 to 4 times per year   . Active Member of Clubs or Organizations: No   . Attends Banker Meetings: Not on file   . Marital Status: Never married   Lives in a ***. Smoking: *** Occupation: ***  Environmental HistorySurveyor, minerals in the house: Copywriter, advertising in the family room: {Blank single:19197::"yes","no"} Carpet in the bedroom: {Blank single:19197::"yes","no"} Heating: {Blank single:19197::"electric","gas","heat pump"} Cooling: {Blank single:19197::"central","window","heat pump"} Pet: {Blank single:19197::"yes ***","no"}  Family History: Family History  Problem Relation Age of Onset  . Stroke Mother   . Atrial fibrillation Mother   . Heart failure Father   . Stomach cancer Maternal Grandfather   . Liver cancer Neg Hx   . Esophageal cancer Neg Hx   . Colon polyps Neg Hx   . Colon cancer Neg Hx    Problem                               Relation Asthma                                   *** Eczema                                *** Food allergy                          *** Allergic rhino conjunctivitis     ***  Review of Systems  Constitutional:  Negative for appetite change, chills, fever and unexpected weight change.  HENT:  Negative for congestion and rhinorrhea.   Eyes:  Negative for itching.  Respiratory:  Negative for cough, chest tightness, shortness of breath and wheezing.    Cardiovascular:  Negative  for chest pain.  Gastrointestinal:  Negative for abdominal pain.  Genitourinary:  Negative for difficulty urinating.  Skin:  Negative for rash.  Neurological:  Negative for headaches.   Objective: There were no vitals taken for this visit. There is no height or weight on file to calculate BMI. Physical Exam Vitals and nursing note reviewed.  Constitutional:      Appearance: Normal appearance. She is well-developed.  HENT:     Head: Normocephalic and atraumatic.     Right Ear: Tympanic membrane and external ear normal.     Left Ear: Tympanic membrane and external ear normal.     Nose: Nose normal.     Mouth/Throat:     Mouth: Mucous membranes are moist.     Pharynx: Oropharynx is clear.  Eyes:     Conjunctiva/sclera: Conjunctivae normal.  Cardiovascular:     Rate and Rhythm: Normal rate and regular rhythm.     Heart sounds: Normal heart sounds. No murmur heard.    No friction rub. No gallop.  Pulmonary:     Effort: Pulmonary effort is normal.     Breath sounds: Normal breath sounds. No wheezing, rhonchi or rales.  Musculoskeletal:     Cervical back: Neck supple.  Skin:    General: Skin is warm.     Findings: No rash.  Neurological:     Mental Status: She is alert and oriented to person, place, and time.  Psychiatric:        Behavior: Behavior normal.  The plan was reviewed with the patient/family, and all questions/concerned were addressed.  It was my pleasure to see Christine [name] today and participate in her care. Please feel free to contact me with any questions or concerns.  Sincerely,  Wyline Mood, DO Allergy & Immunology  [facility] office: [phone] [facility] office: [phone]

## 2023-03-25 ENCOUNTER — Ambulatory Visit (INDEPENDENT_AMBULATORY_CARE_PROVIDER_SITE_OTHER): Payer: Medicaid Other | Admitting: Allergy

## 2023-03-25 ENCOUNTER — Other Ambulatory Visit: Payer: Self-pay

## 2023-03-25 ENCOUNTER — Other Ambulatory Visit (HOSPITAL_COMMUNITY): Payer: Self-pay

## 2023-03-25 ENCOUNTER — Encounter: Payer: Self-pay | Admitting: Allergy

## 2023-03-25 VITALS — BP 92/64 | HR 90 | Temp 97.9°F | Resp 12 | Ht 69.75 in | Wt 178.6 lb

## 2023-03-25 DIAGNOSIS — L299 Pruritus, unspecified: Secondary | ICD-10-CM

## 2023-03-25 DIAGNOSIS — J3089 Other allergic rhinitis: Secondary | ICD-10-CM

## 2023-03-25 DIAGNOSIS — R21 Rash and other nonspecific skin eruption: Secondary | ICD-10-CM

## 2023-03-25 MED ORDER — CETIRIZINE HCL 10 MG PO TABS
10.0000 mg | ORAL_TABLET | Freq: Two times a day (BID) | ORAL | 1 refills | Status: DC
  Filled 2023-03-25: qty 60, 30d supply, fill #0

## 2023-03-25 MED ORDER — FAMOTIDINE 20 MG PO TABS
20.0000 mg | ORAL_TABLET | Freq: Two times a day (BID) | ORAL | 1 refills | Status: DC
  Filled 2023-03-25: qty 60, 30d supply, fill #0

## 2023-03-25 NOTE — Patient Instructions (Addendum)
Rash Etiology unclear.  Keep track of rashes and take pictures. Write down what you had done/eaten during flares.  May use benadryl cream as needed for itching.  See below for proper skin care. Use fragrance free and dye free products. No dryer sheets or fabric softener.    Stop Claritin.  Start zyrtec (cetirizine) 10mg  twice a day. If symptoms are not controlled or causes drowsiness let us know. Start Pepcid (famotidine) 20mg  twice a day.  Avoid the following potential triggers: alcohol, tight clothing, NSAIDs, hot showers and getting overheated. See below for proper skin care.  Get bloodwork. We are ordering labs, so please allow 1-2 weeks for the results to come back. With the newly implemented Cures Act, the labs might be visible to you at the same time that they become visible to me. However, I will not address the results until all of the results are back, so please be patient.   Recommend second opinion of dermatology.   Return in about 4 weeks (around 04/22/2023). Or sooner if needed.   Skin care recommendations  Bath time: Always use lukewarm water. AVOID very hot or cold water. Keep bathing time to 5-10 minutes. Do NOT use bubble bath. Use a mild soap and use just enough to wash the dirty areas. Do NOT scrub skin vigorously.  After bathing, pat dry your skin with a towel. Do NOT rub or scrub the skin.  Moisturizers and prescriptions:  ALWAYS apply moisturizers immediately after bathing (within 3 minutes). This helps to lock-in moisture. Use the moisturizer several times a day over the whole body. Good summer moisturizers include: Aveeno, CeraVe, Cetaphil. Good winter moisturizers include: Aquaphor, Vaseline, Cerave, Cetaphil, Eucerin, Vanicream. When using moisturizers along with medications, the moisturizer should be applied about one hour after applying the medication to prevent diluting effect of the medication or moisturize around where you applied the medications.  When not using medications, the moisturizer can be continued twice daily as maintenance.  Laundry and clothing: Avoid laundry products with added color or perfumes. Use unscented hypo-allergenic laundry products such as Tide free, Cheer free & gentle, and All free and clear.  If the skin still seems dry or sensitive, you can try double-rinsing the clothes. Avoid tight or scratchy clothing such as wool. Do not use fabric softeners or dyer sheets.

## 2023-03-29 ENCOUNTER — Ambulatory Visit: Payer: Medicaid Other | Admitting: Gastroenterology

## 2023-03-29 ENCOUNTER — Telehealth (HOSPITAL_COMMUNITY): Payer: Self-pay

## 2023-03-29 ENCOUNTER — Ambulatory Visit (HOSPITAL_COMMUNITY)
Admission: RE | Admit: 2023-03-29 | Discharge: 2023-03-29 | Disposition: A | Discharge status: Home / Self Care | Payer: Medicaid Other | Source: Ambulatory Visit | Attending: Cardiology | Admitting: Cardiology

## 2023-03-29 DIAGNOSIS — I5022 Chronic systolic (congestive) heart failure: Secondary | ICD-10-CM | POA: Insufficient documentation

## 2023-03-29 LAB — CBC WITH DIFFERENTIAL/PLATELET
Basophils Absolute: 0.1 10*3/uL (ref 0.0–0.2)
Basos: 1 %
EOS (ABSOLUTE): 0.3 10*3/uL (ref 0.0–0.4)
Eos: 4 %
Hematocrit: 52.1 % — ABNORMAL HIGH (ref 34.0–46.6)
Hemoglobin: 17.7 g/dL — ABNORMAL HIGH (ref 11.1–15.9)
Immature Grans (Abs): 0 10*3/uL (ref 0.0–0.1)
Immature Granulocytes: 0 %
Lymphocytes Absolute: 1.6 10*3/uL (ref 0.7–3.1)
Lymphs: 19 %
MCH: 28.9 pg (ref 26.6–33.0)
MCHC: 34 g/dL (ref 31.5–35.7)
MCV: 85 fL (ref 79–97)
Monocytes Absolute: 0.7 10*3/uL (ref 0.1–0.9)
Monocytes: 8 %
Neutrophils Absolute: 5.8 10*3/uL (ref 1.4–7.0)
Neutrophils: 68 %
Platelets: 425 10*3/uL (ref 150–450)
RBC: 6.13 x10E6/uL — ABNORMAL HIGH (ref 3.77–5.28)
RDW: 12.5 % (ref 11.7–15.4)
WBC: 8.5 10*3/uL (ref 3.4–10.8)

## 2023-03-29 LAB — ALLERGENS W/TOTAL IGE AREA 2
Alternaria Alternata IgE: <0.1 kU/L
Aspergillus Fumigatus IgE: <0.1 kU/L
Bermuda Grass IgE: <0.1 kU/L
Cat Dander IgE: 0.31 kU/L — AB
Cedar, Mountain IgE: <0.1 kU/L
Cladosporium Herbarum IgE: <0.1 kU/L
Cockroach, German IgE: <0.1 kU/L
Common Silver Birch IgE: <0.1 kU/L
Cottonwood IgE: <0.1 kU/L
D Farinae IgE: 0.41 kU/L — AB
D Pteronyssinus IgE: 0.99 kU/L — AB
Dog Dander IgE: <0.1 kU/L
Elm, American IgE: <0.1 kU/L
Johnson Grass IgE: 0.34 kU/L — AB
Maple/Box Elder IgE: <0.1 kU/L
Mouse Urine IgE: <0.1 kU/L
Oak, White IgE: <0.1 kU/L
Pecan, Hickory IgE: <0.1 kU/L
Penicillium Chrysogen IgE: <0.1 kU/L
Pigweed, Rough IgE: <0.1 kU/L
Ragweed, Short IgE: <0.1 kU/L
Sheep Sorrel IgE Qn: <0.1 kU/L
Timothy Grass IgE: 0.51 kU/L — AB
White Mulberry IgE: <0.1 kU/L

## 2023-03-29 LAB — BASIC METABOLIC PANEL
Anion gap: 10 (ref 5–15)
BUN: 26 mg/dL — ABNORMAL HIGH (ref 6–20)
CO2: 33 mmol/L — ABNORMAL HIGH (ref 22–32)
Calcium: 9 mg/dL (ref 8.9–10.3)
Chloride: 93 mmol/L — ABNORMAL LOW (ref 98–111)
Creatinine, Ser: 1.22 mg/dL — ABNORMAL HIGH (ref 0.44–1.00)
GFR, Estimated: 52 mL/min — ABNORMAL LOW (ref >60)
Glucose, Bld: 94 mg/dL (ref 70–99)
Potassium: 3.2 mmol/L — ABNORMAL LOW (ref 3.5–5.1)
Sodium: 136 mmol/L (ref 135–145)

## 2023-03-29 LAB — ALPHA-GAL PANEL
Allergen Lamb IgE: <0.1 kU/L
Beef IgE: <0.1 kU/L
IgE (Immunoglobulin E), Serum: 207 [IU]/mL (ref 6–495)
O215-IgE Alpha-Gal: <0.1 kU/L
Pork IgE: <0.1 kU/L

## 2023-03-29 LAB — COMPREHENSIVE METABOLIC PANEL
ALT: 23 [IU]/L (ref 0–32)
AST: 21 [IU]/L (ref 0–40)
Albumin: 5.3 g/dL — ABNORMAL HIGH (ref 3.8–4.9)
Alkaline Phosphatase: 165 [IU]/L — ABNORMAL HIGH (ref 44–121)
BUN/Creatinine Ratio: 18 (ref 9–23)
BUN: 24 mg/dL (ref 6–24)
Bilirubin Total: 0.4 mg/dL (ref 0.0–1.2)
CO2: 24 mmol/L (ref 20–29)
Calcium: 10.3 mg/dL — ABNORMAL HIGH (ref 8.7–10.2)
Chloride: 93 mmol/L — ABNORMAL LOW (ref 96–106)
Creatinine, Ser: 1.36 mg/dL — ABNORMAL HIGH (ref 0.57–1.00)
Globulin, Total: 3.2 g/dL (ref 1.5–4.5)
Glucose: 121 mg/dL — ABNORMAL HIGH (ref 70–99)
Potassium: 4.4 mmol/L (ref 3.5–5.2)
Sodium: 139 mmol/L (ref 134–144)
Total Protein: 8.5 g/dL (ref 6.0–8.5)
eGFR: 46 mL/min/{1.73_m2} — ABNORMAL LOW (ref >59)

## 2023-03-29 LAB — FOOD ALLERGY PROFILE
Allergen Corn, IgE: <0.1 kU/L
Clam IgE: <0.1 kU/L
Codfish IgE: <0.1 kU/L
Egg White IgE: <0.1 kU/L
Milk IgE: <0.1 kU/L
Peanut IgE: <0.1 kU/L
Scallop IgE: <0.1 kU/L
Sesame Seed IgE: <0.1 kU/L
Shrimp IgE: <0.1 kU/L
Soybean IgE: <0.1 kU/L
Walnut IgE: <0.1 kU/L
Wheat IgE: <0.1 kU/L

## 2023-03-29 LAB — CHRONIC URTICARIA: cu index: 9.6 (ref <10)

## 2023-03-29 LAB — TRYPTASE: Tryptase: 12.4 ug/L (ref 2.2–13.2)

## 2023-03-29 NOTE — Telephone Encounter (Signed)
-----   Message from Jacklynn Ganong sent at 03/29/2023 12:59 PM EST ----- K a little low.  Take extra 40 KCL x 3 days. Repeat BMET in 10 days

## 2023-03-29 NOTE — Telephone Encounter (Signed)
Spoke with patient regarding the following results. Patient made aware and patient verbalized understanding.   Patient will take extra potassium as directed. Repeat labs ordered and scheduled.   Advised patient to call back to office with any issues, questions, or concerns. Patient verbalized understanding.

## 2023-04-01 NOTE — Progress Notes (Incomplete)
Office Visit Note  Patient: Christine [name]             Date of Birth: 1966-04-03           MRN: [id number]             PCP: Christine Matar, MD Referring: Christine Cleverly, DO Visit Date: 02/21/2023   Subjective:  New Patient (Initial Visit)   History of Present Illness: Christine [name] is a 57 y.o. female here for evaluation of raynaud's symptoms with a history of diffuse rashes and complicated crohn's disease on entyvio.  Does have significant medical history including congestive heart failure (LVEF < 20%).  She has been dealing with diffuse rashes ongoing since around March of this year.  These have broken out in several different areas but the most prominent being a large so far she is try to treat treatments including loratadine and Benadryl were not helpful for the visible rash or the itching.  She did see dermatology for a skin biopsy apparently this indicated some type of inflammation and was prescribed topical triamcinolone as a treatment.  Was not recommended any other systemic therapy and she is not sure about specific diagnosis.  She did have some lab test including antibody test with negative Sjogren syndrome markers.  Rashes all over since ~march Trying loratadine and diphenhydramine not much relief Flat erytheamtous patch left thigh lasting hours Saw derm, did biopsy, ?some kind of inflammation just given triamcinolone no other specific Rx SSA was neg apparently?  Heart failure, on lots Lots of K supplementation -diuretics and poor absorption On eliquis  Before entyvio Toes turning blue and tingling  Has appt with Christine [name] in December  Mild eos up In [name] but better on last labs ***   Activities of Daily Living:  Patient reports morning stiffness for *** {minute/hour:19697}.   Patient Denies nocturnal pain.  Difficulty dressing/grooming: Denies Difficulty climbing stairs: Denies Difficulty getting out of chair: Denies Difficulty using hands for taps,  buttons, cutlery, and/or writing: Denies  Review of Systems  Constitutional:  Positive for fatigue.  HENT:  Positive for mouth sores and mouth dryness.   Eyes: Negative.  Negative for dryness.  Respiratory:  Positive for shortness of breath.   Cardiovascular: Negative.  Negative for chest pain and palpitations.  Gastrointestinal:  Positive for constipation and diarrhea.  Endocrine: Negative.  Negative for increased urination.  Genitourinary: Negative.  Negative for involuntary urination.  Musculoskeletal: Negative.  Negative for joint pain, gait problem, joint pain, joint swelling, myalgias, muscle weakness, morning stiffness, muscle tenderness and myalgias.  Skin:  Positive for rash. Negative for color change, hair loss and sensitivity to sunlight.  Allergic/Immunologic: Negative.  Negative for susceptible to infections.  Neurological:  Positive for dizziness and headaches.  Hematological: Negative.  Negative for swollen glands.  Psychiatric/Behavioral:  Positive for depressed [name]. Negative for sleep disturbance. The patient is nervous/anxious.     PMFS History:  Patient Active Problem List   Diagnosis Date Noted  . Crohn's disease involving terminal ileum (HCC) 07/23/2022  . Crohn's disease of large intestine with other complication (HCC) 05/11/2022  . Mild major depression (HCC) 05/11/2022  . Paroxysmal tachycardia, unspecified (HCC) 05/11/2022  . Essential hypertension 03/02/2022  . Acute pancreatitis 03/01/2022  . Hypokalemia 03/01/2022  . Ileitis 03/01/2022  . Diarrhea 02/26/2022  . Nausea without vomiting 02/26/2022  . Loss of weight 02/26/2022  . 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  . Ulcer of ileum 02/26/2022  . Diverticulosis of  colon without hemorrhage 02/26/2022  . Cardiomyopathy (HCC) ischemic and non-ischemic 01/16/2022  . Abdominal aortic aneurysm (AAA) 3.0 cm to 5.0 cm in diameter in female (HCC) 01/09/2022  . Polycythemia 01/09/2022  . Chronic combined systolic  and diastolic heart failure (HCC) 08/24/2021  . CAD in native artery 08/24/2021  . S/P ICD (internal cardiac defibrillator) procedure 08/10/2021  . Obstructive sleep apnea 05/05/2021  . Former smoker 02/06/2021  . 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  . Obesity (BMI 30.0-34.9) 02/06/2021  . Anxiety 01/20/2021  . Tobacco abuse 01/20/2021  . Shortness of breath 01/20/2021  . Chest tightness     Past Medical History:  Diagnosis Date  . Allergy   . Anxiety 01/20/2021  . CAD in native artery 08/24/2021  . CHF (congestive heart failure) (HCC)   . Chronic combined systolic and diastolic heart failure (HCC) 08/24/2021  . Complication of anesthesia   . Crohn's colitis (HCC)   . Depression   . Hyperlipidemia   . Myocardial infarction (HCC)   . Presence of cardiac defibrillator 07/2021  . Shortness of breath 01/20/2021  . Sleep apnea 07/2021  . Tobacco abuse 01/20/2021    Family History  Problem Relation Age of Onset  . Stroke Mother   . Atrial fibrillation Mother   . Heart failure Father   . Stomach cancer Maternal Grandfather   . Liver cancer Neg Hx   . Esophageal cancer Neg Hx   . Colon polyps Neg Hx   . Colon cancer Neg Hx    Past Surgical History:  Procedure Laterality Date  . ABDOMINAL HYSTERECTOMY    . BIOPSY  02/26/2022   Procedure: BIOPSY;  Surgeon: Christine Cleverly, DO;  Location: [facility];  Service: Gastroenterology;;  . CARDIAC CATHETERIZATION    . CHOLECYSTECTOMY    . COLONOSCOPY WITH PROPOFOL N/A 02/26/2022   Procedure: COLONOSCOPY WITH PROPOFOL;  Surgeon: Christine Cleverly, DO;  Location: [facility];  Service: Gastroenterology;  Laterality: N/A;  . ESOPHAGOGASTRODUODENOSCOPY (EGD) WITH PROPOFOL N/A 02/26/2022   Procedure: ESOPHAGOGASTRODUODENOSCOPY (EGD) WITH PROPOFOL;  Surgeon: Christine Cleverly, DO;  Location: [facility];  Service: Gastroenterology;  Laterality: N/A;  . ICD IMPLANT N/A 07/21/2021   Procedure: ICD IMPLANT;  Surgeon: Duke Salvia, MD;  Location: [facility] INVASIVE CV LAB;  Service: Cardiovascular;  Laterality: N/A;  . NECK SURGERY    . OVARIAN CYST SURGERY    . POLYPECTOMY  02/26/2022   Procedure: POLYPECTOMY;  Surgeon: Christine Cleverly, DO;  Location: [facility];  Service: Gastroenterology;;  . RIGHT/LEFT HEART CATH AND CORONARY ANGIOGRAPHY N/A 01/23/2021   Procedure: RIGHT/LEFT HEART CATH AND CORONARY ANGIOGRAPHY;  Surgeon: Dolores Patty, MD;  Location: [facility] INVASIVE CV LAB;  Service: Cardiovascular;  Laterality: N/A;   Social History   Social History Narrative  . Not on file   Immunization History  Administered Date(s) Administered  . Hepb-cpg 10/04/2022, 11/06/2022  . Influenza, Seasonal, Injecte, Preservative Fre 01/15/2023  . Influenza,inj,Quad PF,6+ Mos 02/06/2021, 01/09/2022  . PFIZER(Purple Top)SARS-COV-2 Vaccination 08/29/2019, 09/23/2019, 04/02/2020  . PNEUMOCOCCAL CONJUGATE-20 02/06/2021  . Tdap 09/05/2021  . Zoster Recombinant(Shingrix) 05/11/2022, 01/29/2023     Objective: Vital Signs: BP (!) 87/59 (BP Location: Right Arm, Patient Position: Sitting, Cuff Size: Normal)   Pulse 78   Resp 16   Ht 5\' 10"  (1.778 m)   Wt 180 lb (81.6 kg)   BMI 25.83 kg/m    Physical Exam   Musculoskeletal Exam: ***  CDAI Exam: CDAI Score: -- Patient Global: --;  Provider Global: -- Swollen: --; Tender: -- Joint Exam 02/21/2023   No joint exam has been documented for this visit   There is currently no information documented on the homunculus. Go to the Rheumatology activity and complete the homunculus joint exam.  Investigation: No additional findings.  Imaging: No results found.  Recent Labs: Lab Results  Component Value Date   WBC 12.7 (H) 01/30/2023   HGB 16.0 (H) 01/30/2023   PLT 390 01/30/2023   NA 141 02/18/2023   K 3.4 (L) 02/18/2023   CL 102 02/18/2023   CO2 28 02/18/2023   GLUCOSE 76 02/18/2023   BUN 13 02/18/2023   CREATININE 0.90 02/18/2023   BILITOT 0.7 12/15/2022    ALKPHOS 82 12/15/2022   AST 23 12/15/2022   ALT 27 12/15/2022   PROT 6.8 12/15/2022   ALBUMIN 3.7 12/15/2022   CALCIUM 9.1 02/18/2023   GFRAA  03/31/2008    >60        The eGFR has been calculated using the MDRD equation. This calculation has not been validated in all clinical   QFTBGOLDPLUS NEGATIVE 07/10/2022    Speciality Comments: No specialty comments available.  Procedures:  No procedures performed Allergies: Gabapentin, Sulfa antibiotics, Erythromycin, Tramadol, Ibuprofen, and Tape   Assessment / Plan:     Visit Diagnoses: No diagnosis found.  Orders: No orders of the defined types were placed in this encounter.  No orders of the defined types were placed in this encounter.   Face-to-face time spent with patient was *** minutes. Greater than 50% of time was spent in counseling and coordination of care.  Follow-Up Instructions: No follow-ups on file.   Fuller Plan, MD  Note - This record has been created using [employer].  Chart creation errors have been sought, but may not always  have been located. Such creation errors do not reflect on  the standard of medical care.

## 2023-04-02 ENCOUNTER — Other Ambulatory Visit: Payer: Self-pay

## 2023-04-03 ENCOUNTER — Other Ambulatory Visit (HOSPITAL_COMMUNITY): Payer: Self-pay

## 2023-04-03 ENCOUNTER — Encounter (HOSPITAL_COMMUNITY): Payer: Self-pay | Admitting: Internal Medicine

## 2023-04-05 ENCOUNTER — Other Ambulatory Visit (HOSPITAL_COMMUNITY): Payer: Self-pay

## 2023-04-07 ENCOUNTER — Telehealth: Payer: Self-pay | Admitting: Internal Medicine

## 2023-04-07 NOTE — Telephone Encounter (Signed)
Christine [name] called around 7:30 PM regarding increased weight gain and shortness of breath.  She says she has had increased weight gain since this past Friday.  She thinks she has gained at least 5 pounds.  She also has shortness of breath that has been progressively worse over the past 1 to 2 weeks.  At baseline, she sleeps in a recliner because she is not able to lie flat.    She takes 80 Mg Lasix twice daily with 320 MEQ KCl.  She states she is urinating, however she called because she requested a prescription of metolazone for increased urination.  She took an extra 40 mg Lasix without improvement.  Her most recent potassium was noted to be 3.2 on 03-29-2023.  Unfortunately, due to her hypokalemia, I told the patient I could not prescribe metolazone.  Has an appointment with her cardiologist tomorrow.  Christine [name] states she is willing to wait until then.  Does not wish to come to the ER unless she feels it is absolutely necessary.  I instructed the patient to call 911 if her symptoms acutely worsen.  The patient agreed with this plan.

## 2023-04-08 ENCOUNTER — Telehealth: Payer: Self-pay

## 2023-04-08 ENCOUNTER — Ambulatory Visit (HOSPITAL_COMMUNITY)
Admission: RE | Admit: 2023-04-08 | Discharge: 2023-04-08 | Disposition: A | Discharge status: Home / Self Care | Payer: Medicaid Other | Source: Ambulatory Visit | Attending: Cardiology | Admitting: Cardiology

## 2023-04-08 ENCOUNTER — Telehealth: Payer: Self-pay | Admitting: Internal Medicine

## 2023-04-08 ENCOUNTER — Telehealth: Payer: Self-pay | Admitting: Cardiovascular Disease

## 2023-04-08 ENCOUNTER — Other Ambulatory Visit (HOSPITAL_COMMUNITY): Payer: Self-pay

## 2023-04-08 DIAGNOSIS — I5022 Chronic systolic (congestive) heart failure: Secondary | ICD-10-CM

## 2023-04-08 LAB — BASIC METABOLIC PANEL
Anion gap: 10 (ref 5–15)
BUN: 16 mg/dL (ref 6–20)
CO2: 29 mmol/L (ref 22–32)
Calcium: 9.2 mg/dL (ref 8.9–10.3)
Chloride: 96 mmol/L — ABNORMAL LOW (ref 98–111)
Creatinine, Ser: 1 mg/dL (ref 0.44–1.00)
GFR, Estimated: >60 mL/min (ref >60)
Glucose, Bld: 91 mg/dL (ref 70–99)
Potassium: 3.5 mmol/L (ref 3.5–5.1)
Sodium: 135 mmol/L (ref 135–145)

## 2023-04-08 MED ORDER — METOLAZONE 2.5 MG PO TABS
2.5000 mg | ORAL_TABLET | ORAL | 0 refills | Status: DC
  Filled 2023-04-08: qty 1, 1d supply, fill #0

## 2023-04-08 NOTE — Telephone Encounter (Signed)
"  Ok to take metolazone 2.5 mg  dose today with 40 KCL. Repeat BMET next week please. OK to refill metolazone" refill sent to requested pharmacy. Pt should have refills for KCL.

## 2023-04-08 NOTE — Telephone Encounter (Signed)
Pt informed of providers result & recommendations. Pt verbalized understanding. All questions, if any, were answered.

## 2023-04-08 NOTE — Telephone Encounter (Signed)
error 

## 2023-04-08 NOTE — Telephone Encounter (Signed)
Pt is requesting a callback from nurse Josephine Igo, regarding fluid levels. Please advise

## 2023-04-08 NOTE — Telephone Encounter (Signed)
Returned patient call as requested.    Pt reporting my chart message sent on 12/19 to HF clinic but has not received a response regarding the ongoing issue of weekly weight gain of between 6-13 pounds and only the Metolazone has been partially effective with decreasing weight gain and SOB BUT the fluid returns very quickly.  Weight never returns to her baseline of 177-178 lbs.  Her last Metolazone dosage was taken Wednesday, 12/18 to try and resolve a 6 to 8 pound weight gain prior to 12/18.    Since Friday, 12/20 she gained 13 lbs with a weight of 191 lbs.   She called the on call HF clinic physician over the weekend but the physician did not want to prescribe metolazone due unknown potassium level which will be drawn today at 3:30 in HF clinic.    She took extra Lasix 12/22 and weight has dropped to 186 lbs (177-178 lbs baseline) as of today, 12/23.  Pt would like to develop a plan to treat the repeat pattern of fluid accumulation which will help her to avoid repeated calls or messaging to HF clinic and decrease chance of ER visits.    If she is to continue to take Metolazone, she needs a refill of Metolazone d/t last tablet taken 12/18.    She is limiting fluid intake to 2 liters a day.   She was taking Metolazone about twice a month but was more often over the last month but limited due to the amount of tablets prescribed and instructed to contact HF clinic for approval to take.    Heartlogic report sent today, 12/23 and results show 0 Heartlogic HF index score which does not correlate with symptoms.  Thoracic impedance does show improvement after taking Metolazone on 12/18 but impedance shows sharp decrease again correlating with 13 pound weight gain.

## 2023-04-08 NOTE — Telephone Encounter (Signed)
Patient aware of recommendations via separate encounter

## 2023-04-08 NOTE — Telephone Encounter (Signed)
-----   Message from [facility] sent at 03/26/2023  8:40 AM EST -----  ----- Message ----- From: Ellamae Sia, DO Sent: 03/25/2023   6:30 PM EST To: [facility]  Please place referral to dermatology - chronic pruritic rash. Thank you.

## 2023-04-12 ENCOUNTER — Other Ambulatory Visit (HOSPITAL_COMMUNITY): Payer: Self-pay

## 2023-04-12 ENCOUNTER — Telehealth (HOSPITAL_COMMUNITY): Payer: Self-pay

## 2023-04-12 MED ORDER — METOLAZONE 2.5 MG PO TABS: 2.5000 mg | ORAL_TABLET | ORAL | 0 refills | Status: DC

## 2023-04-12 MED ORDER — POTASSIUM CHLORIDE CRYS ER 20 MEQ PO TBCR
EXTENDED_RELEASE_TABLET | ORAL | 3 refills | Status: DC
  Filled 2023-04-12: qty 510, 30d supply, fill #0
  Filled 2023-05-11: qty 510, 30d supply, fill #1
  Filled 2023-06-08: qty 510, 30d supply, fill #2

## 2023-04-12 NOTE — Telephone Encounter (Signed)
Patient reported that she took the metolazone on Monday as directed however she has continued to gain weight. Her weight is now 187, please advise.

## 2023-04-12 NOTE — Addendum Note (Signed)
Addended by: Rob Bunting R on: 04/12/2023 04:17 PM   Modules accepted: Orders

## 2023-04-12 NOTE — Telephone Encounter (Signed)
Sounds like she needs metolazone once or twice a week at this point. Continue current dose of lasix 80 BID, can take an extra 2.5 mg metolazone today with extra 40 mEq K. Then start taking metolazone 2.5 once weekly w/ K supp on Mondays. Repeat BMET in about 10 days. Check device impedance early next week.

## 2023-04-12 NOTE — Telephone Encounter (Signed)
Patient advised and verbalized understanding.rx sent into patients pharmacy

## 2023-04-15 ENCOUNTER — Other Ambulatory Visit (HOSPITAL_COMMUNITY): Payer: Self-pay

## 2023-04-15 ENCOUNTER — Ambulatory Visit: Payer: Medicaid Other | Attending: Internal Medicine

## 2023-04-15 DIAGNOSIS — Z95810 Presence of automatic (implantable) cardiac defibrillator: Secondary | ICD-10-CM

## 2023-04-15 DIAGNOSIS — I5022 Chronic systolic (congestive) heart failure: Secondary | ICD-10-CM | POA: Diagnosis not present

## 2023-04-16 NOTE — Progress Notes (Signed)
EPIC Encounter for ICM Monitoring  Patient Name: Christine [name] is a 57 y.o. female Date: 04/16/2023 Primary Care Physican: Marcine Matar, MD Primary Cardiologist: [place] Electrophysiologist: Graciela Husbands Last Weight: 185.5 lbs 12/19/2022 Weight: 179 lbs (baseline 177 lbs) 02/06/2023 Weight: 182.5 lbs 02/13/2023 Weight: 181 lbs  03/12/2023 Weight: 183 lbs (baseline 177 lbs) 04/16/2023 Weight: 183 lbs                                                            Spoke with patient and heart failure questions reviewed.  Transmission results reviewed.  Pt reports taking Metolazone twice in the last 4 days and breathing has returned to normal and weight is starting to return closer to her baseline of 178 lbs        HeartLogic Heart Failure Index remains 0 suggesting fluid levels are within normal threshold range which does not correlate with patients repetitive weekly weight gain of 5-8 lbs.   Impedance is trending up after taking Metolazone.     Prescribed:  Furosemide 20 mg take 4 tablet(s) (80 mg total) by mouth twice a day Potassium 20 mEq take 5 tablet(s) (100 mEq total) by mouth three times daily Metolazone 2.5 mg take 1 tablet by weekly.  Take additional 40 mEq of Potassium with each dose Spironolactone 25 mg take 1 tablet by mouth daily.    Labs: 04/19/2023 BMET scheduled 04/08/2023 Creatinine 1.00, BUN 16, Potassium 3.5, Sodium 135, GFR >60  03/29/2023 Creatinine 1.22, BUN 26, Potassium 3.2, Sodium 136, GFR 52  03/25/2023 Creatinine 1.36, BUN 24, Potassium 4.4, Sodium 139  03/18/2023 Creatinine 0.96, BUN 10, Potassium 3.6, Sodium 140, GFR 57 03/06/2023 Creatinine 1.13, BUN 15, Potassium 4.1, Sodium 139, GFR 57 02/18/2023 Creatinine 0.90, BUN 13, Potassium 3.4, Sodium 141, GFR >60 02/04/2023 Creatinine 0.99, BUN 16, Potassium 4.0, Sodium 137, GFR >60  01/30/2023 Creatinine 1.24, BUN 37, Potassium 3.8, Sodium 133, GFR 51  01/21/2023 Creatinine 1.14, BUN 21, Potassium 3.6,  Sodium 133, GFR 57  01/14/2023 Creatinine 1.24, BUN 26, Potassium 2.8, Sodium 134, GFR 51 01/11/2023 Creatinine 1.37, BUN 29, Potassium 2.4, Sodium 133, GFR 46 12/27/2022 Creatinine 1.08, BUN 14, Potassium 3.4, Sodium 136, GFR >60 (low potassium addressed by HF clinic) A complete set of results can be found in Results Review.   Recommendations:  No changes and encouraged to call if experiencing any fluid symptoms.   Follow-up plan: ICM clinic phone appointment on 04/23/2023 to recheck fluid levels per HF 04/12/2023 phone note.   91 day device clinic remote transmission 04/19/2023.              EP/Cardiology next office visit:  05/22/2023 with Dr Gala Romney.  Provided EP scheduler number and advised to call for overdue appt with Dr Graciela Husbands.  Last OV with Dr Graciela Husbands was 02/07/2022          Copy of ICM check sent to Dr. Graciela Husbands.  3 Month HeartLogicT Heart Failure Index:    8 Day Data Trend:          Karie Soda, RN 04/16/2023 3:45 PM

## 2023-04-17 DIAGNOSIS — Z419 Encounter for procedure for purposes other than remedying health state, unspecified: Secondary | ICD-10-CM | POA: Diagnosis not present

## 2023-04-18 ENCOUNTER — Other Ambulatory Visit (HOSPITAL_COMMUNITY): Payer: Self-pay

## 2023-04-19 ENCOUNTER — Telehealth (HOSPITAL_COMMUNITY): Payer: Self-pay | Admitting: Cardiology

## 2023-04-19 ENCOUNTER — Other Ambulatory Visit (HOSPITAL_COMMUNITY): Payer: Self-pay

## 2023-04-19 ENCOUNTER — Other Ambulatory Visit (HOSPITAL_COMMUNITY): Payer: Medicaid Other

## 2023-04-19 ENCOUNTER — Ambulatory Visit (INDEPENDENT_AMBULATORY_CARE_PROVIDER_SITE_OTHER): Payer: Medicaid Other

## 2023-04-19 DIAGNOSIS — I255 Ischemic cardiomyopathy: Secondary | ICD-10-CM

## 2023-04-19 DIAGNOSIS — I5022 Chronic systolic (congestive) heart failure: Secondary | ICD-10-CM

## 2023-04-19 LAB — CUP PACEART REMOTE DEVICE CHECK
Battery Remaining Longevity: 162 mo
Battery Remaining Percentage: 100 %
Brady Statistic RV Percent Paced: 0 %
Date Time Interrogation Session: 20250103065800
HighPow Impedance: 100 Ohm
Implantable Lead Connection Status: 753985
Implantable Lead Implant Date: 20230407
Implantable Lead Location: 753860
Implantable Lead Model: 138
Implantable Lead Serial Number: 303166
Implantable Pulse Generator Implant Date: 20230407
Lead Channel Impedance Value: 962 Ohm
Lead Channel Pacing Threshold Amplitude: 1.3 V
Lead Channel Pacing Threshold Pulse Width: 0.4 ms
Lead Channel Setting Pacing Amplitude: 2.5 V
Lead Channel Setting Pacing Pulse Width: 0.4 ms
Lead Channel Setting Sensing Sensitivity: 0.5 mV
Pulse Gen Serial Number: 216478
Zone Setting Status: 755011

## 2023-04-19 MED ORDER — METOLAZONE 2.5 MG PO TABS: ORAL_TABLET | ORAL | 2 refills | Status: DC

## 2023-04-19 NOTE — Telephone Encounter (Signed)
 Pt aware and voiced understanding

## 2023-04-19 NOTE — Telephone Encounter (Signed)
 Pt called to request METOLAZONE  script change  Reports weight gain is 5-10 lbs on off days of metolazone  and her SOB increases  In the past week she took 5mg  metolazone  M,W,F and it helped decrease weight/SOB improved   -currently ordered for once a week  -see 12/30 transmission note from EP team/device clinic

## 2023-04-20 ENCOUNTER — Other Ambulatory Visit: Payer: Self-pay | Admitting: Student

## 2023-04-20 ENCOUNTER — Telehealth: Payer: Self-pay | Admitting: Internal Medicine

## 2023-04-20 MED ORDER — METOLAZONE 2.5 MG PO TABS: ORAL_TABLET | ORAL | 0 refills | Status: DC

## 2023-04-20 MED ORDER — METOLAZONE 2.5 MG PO TABS: ORAL_TABLET | ORAL | 2 refills | Status: DC

## 2023-04-20 NOTE — Telephone Encounter (Signed)
 Called by patient overnight - stated that she was unable to fill prescription for metolazone  that was prescribed earlier - due to pharmacy having questions about prescribing.   Called pharmacist to discuss and clarified issues. Metolazone  to be filled tonight.  Marinda Hane MD Cardiology

## 2023-04-21 NOTE — Progress Notes (Signed)
 Follow Up Note  RE: Christine [name] MRN: [id number] DOB: 1965-05-03 Date of Office Visit: 04/22/2023  Referring provider: Vicci Barnie NOVAK, MD Primary care provider: Vicci Barnie NOVAK, MD  Chief Complaint: Rash (No issues - skin is clearing up and itchiness is almost gone )  History of Present Illness: I had the pleasure of seeing Christine [name] for a follow up visit at the [facility] on 04/22/2023. She is a 59 y.o. female, who is being followed for pruritic rash and allergic rhinitis. Her previous allergy  office visit was on 03/25/2023 with Dr. Luke. Today is a regular follow up visit.  Discussed the use of AI scribe software for clinical note transcription with the patient, who gave verbal consent to proceed.  The patient, with a history of heart failure, polycythemia, and sleep apnea, presented for a follow-up visit after a month. She had been experiencing rashes and itching, the cause of which was unknown. The patient reported significant improvement in her symptoms, with occasional itchy days but no new rashes or spots. She attributed this improvement to the two medications prescribed at the last visit, Zyrtec  and Famotidine , taken twice daily.  The patient also reported an allergy  to dust mites and had taken measures to deep clean her living environments. She denied having any pets. She also mentioned frequent visits to the heart failure clinic for blood work every two weeks. She reported changes in her Lasix  dosage and the addition of Metolazone  to her medication regimen, which she takes more frequently now.  The patient lives part of the week with her sister and part of the week with her boyfriend, neither of whom have experienced similar rashes. 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 She uses CPAP for her sleep apnea.     Assessment and Plan: Christine [name]  is a 58 y.o. female with: Rash and other nonspecific skin eruption Pruritus Past history - daily pruritic rash on extremities and neck, not torso. Previous biopsy showed Jesner's lymphocytic infiltrates. Current treatment with loratadine  and triamcinolone  cream provides minimal relief. No clear triggers identified. Gabapentin caused diarrhea. Medical history significant for DM, CHF, CAD, Crohn's.  Interim history - Improvement in itching and rash with Zyrtec  and Famotidine . No new lesions. 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  Etiology unclear.  Keep track of rashes and take pictures. If worsens - need to rule out hereditary alpha tryptasemia.  Write down what you had done/eaten during flares.  May use benadryl cream as needed for itching.  Continue proper skin care. Continue zyrtec  (cetirizine ) 10mg  twice a day. If symptoms are not controlled or causes drowsiness let us  know. Continue Pepcid  (famotidine ) 20mg  twice a day.  Avoid the following potential triggers: alcohol, tight clothing, NSAIDs, hot showers and getting overheated. Continue proper skin care.  Get bloodwork. Recommend second opinion referral to dermatology.    Allergic rhinitis due to dust mite Allergic rhinitis due to animal dander Seasonal allergic rhinitis due to pollen Past history - Symptomatic in the spring. Interim history - 2024 bloodwork positive to dust mites and borderline to cat and grass. Continue environmental control measures.     Return in about 2 months (around 06/20/2023).  Meds ordered this encounter  Medications   famotidine  (PEPCID ) 20 MG tablet    Sig: Take  1 tablet (20 mg total) by mouth 2 (two) times daily.    Dispense:  60 tablet    Refill:  5   cetirizine  (ZYRTEC  ALLERGY ) 10 MG tablet    Sig: Take 1 tablet (10 mg total) by mouth 2 (two) times daily.    Dispense:  60 tablet     Refill:  5   Lab Orders         Hepatic function panel         CBC with Differential/Platelet      Diagnostics: None.   Medication List:  Current Outpatient Medications  Medication Sig Dispense Refill   Accu-Chek Softclix Lancets lancets Use as instructed.  Check blood sugar daily before breakfast. 100 each 12   acetaminophen  (TYLENOL ) 500 MG tablet Take 1,000 mg by mouth 3 (three) times daily as needed for moderate pain or headache.     ALPRAZolam  (XANAX ) 1 MG tablet Take 2 mg by mouth 3 (three) times daily.     alum & mag hydroxide-simeth (MAALOX/MYLANTA) 200-200-20 MG/5ML suspension Take 30 mLs by mouth every 4 (four) hours as needed for indigestion or heartburn. 355 mL 0   apixaban  (ELIQUIS ) 5 MG TABS tablet Take 1 tablet (5 mg total) by mouth 2 (two) times daily. 60 tablet 11   atorvastatin  (LIPITOR ) 80 MG tablet Take 1 tablet (80 mg total) by mouth daily. 90 tablet 3   Blood Glucose Monitoring Suppl (ACCU-CHEK GUIDE) w/Device KIT Check blood sugar once daily before breakfast 1 kit 0   buPROPion  (WELLBUTRIN  XL) 150 MG 24 hr tablet Take 150 mg by mouth daily.     dapagliflozin  propanediol (FARXIGA ) 10 MG TABS tablet Take 1 tablet (10 mg total) by mouth daily. 30 tablet 9   diphenoxylate -atropine  (LOMOTIL ) 2.5-0.025 MG tablet Take 2 tablets by mouth 4 (four) times daily as needed for diarrhea/loose stools. 60 tablet 3   feeding supplement (ENSURE ENLIVE / ENSURE PLUS) LIQD Take 237 mLs by mouth 2 (two) times daily between meals. 237 mL 12   Fezolinetant  (VEOZAH ) 45 MG TABS Take 1 tablet (45 mg total) by mouth daily. 30 tablet 2   furosemide  (LASIX ) 20 MG tablet Take 4 tablets (80 mg total) by mouth 2 (two) times daily. 240 tablet 3   glucose blood (ACCU-CHEK GUIDE) test strip Use as instructed to check blood sugar daily before breakfast 100 each 12   ivabradine  (CORLANOR ) 5 MG TABS tablet Take 1/2 tablet (2.5 mg total) by mouth 2 (two) times daily. 30 tablet 5    lipase/protease/amylase (CREON ) 36000 UNITS CPEP capsule Take 2 capsules (72,000 Units total) by mouth with meals AND 1 capsule (36,000 Units total) with snacks. 300 capsule 5   metolazone  (ZAROXOLYN ) 2.5 MG tablet Take 1 tablet (2.5 mg total) by mouth 3 (three) times a week for 3 days, THEN 1 tablet (2.5 mg total) 2 (two) times a week. Take with additional 40 meq of potassium. 8 tablet 0   metoprolol  succinate (TOPROL -XL) 25 MG 24 hr tablet Take 1/2 tablet (12.5 mg total) by mouth daily. 90 tablet 0   metroNIDAZOLE  (METROGEL ) 1 % gel Apply 1 Application topically as needed (rash).     Multiple Vitamins-Minerals (MULTIVITAMIN WITH MINERALS) tablet Take 1 tablet by mouth daily.     oxymetazoline (AFRIN) 0.05 % nasal spray Place 1-2 sprays into both nostrils 2 (two) times daily as needed for congestion.     potassium chloride  SA (KLOR-CON  M) 20 MEQ tablet Take 6 tablets (120 mEq total)  by mouth every morning AND 5 tablets (100 mEq total) daily at 12 noon AND 6 tablets (120 mEq total) every evening. 510 tablet 3   prazosin (MINIPRESS) 2 MG capsule Take 2-6 mg by mouth daily as needed (for sleep).     promethazine  (PHENERGAN ) 25 MG tablet Take 1 tablet (25 mg total) by mouth every 6 (six) to 8 (eight) hours for nausea or vomiting. (Patient taking differently: Take 25 mg by mouth every 6 (six) hours as needed for nausea or vomiting.) 90 tablet 2   sitaGLIPtin  (JANUVIA ) 50 MG tablet Take 1 tablet (50 mg total) by mouth daily. 90 tablet 0   spironolactone  (ALDACTONE ) 25 MG tablet Take 1 tablet (25 mg total) by mouth daily. 90 tablet 3   triamcinolone  cream (KENALOG ) 0.1 % Apply 1 Application (1 gram) topically 2 (two) times daily. 80 g 1   vedolizumab  (ENTYVIO ) 300 MG injection Inject 300 mg into the vein every 2 (two) months.     zolpidem  (AMBIEN ) 10 MG tablet Take 5-10 mg by mouth at bedtime.     cetirizine  (ZYRTEC  ALLERGY ) 10 MG tablet Take 1 tablet (10 mg total) by mouth 2 (two) times daily. 60 tablet 5    famotidine  (PEPCID ) 20 MG tablet Take 1 tablet (20 mg total) by mouth 2 (two) times daily. 60 tablet 5   No current facility-administered medications for this visit.   Allergies: Allergies  Allergen Reactions   Gabapentin Diarrhea   Sulfa Antibiotics Hives, Itching and Rash   Erythromycin Hives   Tramadol  Rash and Other (See Comments)    Urinary retention     Ibuprofen Itching   Tape Rash   I reviewed her past medical history, social history, family history, and environmental history and no significant changes have been reported from her previous visit.  Review of Systems  Constitutional:  Negative for appetite change, chills, fever and unexpected weight change.  HENT:  Negative for congestion and rhinorrhea.   Eyes:  Negative for itching.  Respiratory:  Positive for shortness of breath. Negative for cough, chest tightness and wheezing.   Cardiovascular:  Negative for chest pain.  Gastrointestinal:  Negative for abdominal pain.  Genitourinary:  Negative for difficulty urinating.  Skin:  Positive for rash.       improved  Neurological:  Negative for headaches.    Objective: BP 110/66 (Cuff Size: Normal)   Pulse 75   Temp 97.6 F (36.4 C)   Resp 18   Wt 182 lb 8 oz (82.8 kg)   SpO2 97%   BMI 26.37 kg/m  Body mass index is 26.37 kg/m. Physical Exam Vitals and nursing note reviewed.  Constitutional:      Appearance: Normal appearance. She is well-developed.  HENT:     Head: Normocephalic and atraumatic.     Right Ear: Tympanic membrane and external ear normal.     Left Ear: Tympanic membrane and external ear normal.     Nose: Nose normal.     Mouth/Throat:     Mouth: Mucous membranes are moist.     Pharynx: Oropharynx is clear.  Eyes:     Conjunctiva/sclera: Conjunctivae normal.  Cardiovascular:     Rate and Rhythm: Normal rate and regular rhythm.     Heart sounds: Normal heart sounds. No murmur heard.    No friction rub. No gallop.  Pulmonary:      Effort: Pulmonary effort is normal.     Breath sounds: Normal breath sounds. No wheezing, rhonchi or rales.  Musculoskeletal:     Cervical back: Neck supple.  Skin:    General: Skin is warm.     Findings: Rash present.     Comments: Multiple circular clustering of skin changes on lower and upper extremities with excoriation marks.   Neurological:     Mental Status: She is alert and oriented to person, place, and time.  Psychiatric:        Behavior: Behavior normal.    Previous notes and tests were reviewed. The plan was reviewed with the patient/family, and all questions/concerned were addressed.  It was my pleasure to see Christine [name] today and participate in her care. Please feel free to contact me with any questions or concerns.  Sincerely,  Orlan Cramp, DO Allergy  & Immunology  [facility]  [place] office: [phone] [facility] office: [phone]

## 2023-04-22 ENCOUNTER — Other Ambulatory Visit: Payer: Self-pay

## 2023-04-22 ENCOUNTER — Encounter: Payer: Self-pay | Admitting: Allergy

## 2023-04-22 ENCOUNTER — Other Ambulatory Visit (HOSPITAL_COMMUNITY): Payer: Self-pay

## 2023-04-22 ENCOUNTER — Ambulatory Visit (INDEPENDENT_AMBULATORY_CARE_PROVIDER_SITE_OTHER): Payer: Medicaid Other | Admitting: Allergy

## 2023-04-22 VITALS — BP 110/66 | HR 75 | Temp 97.6°F | Resp 18 | Wt 182.5 lb

## 2023-04-22 DIAGNOSIS — R21 Rash and other nonspecific skin eruption: Secondary | ICD-10-CM | POA: Diagnosis not present

## 2023-04-22 DIAGNOSIS — J3081 Allergic rhinitis due to animal (cat) (dog) hair and dander: Secondary | ICD-10-CM

## 2023-04-22 DIAGNOSIS — J3089 Other allergic rhinitis: Secondary | ICD-10-CM | POA: Diagnosis not present

## 2023-04-22 DIAGNOSIS — J301 Allergic rhinitis due to pollen: Secondary | ICD-10-CM

## 2023-04-22 DIAGNOSIS — L299 Pruritus, unspecified: Secondary | ICD-10-CM

## 2023-04-22 MED ORDER — FAMOTIDINE 20 MG PO TABS
20.0000 mg | ORAL_TABLET | Freq: Two times a day (BID) | ORAL | 5 refills | Status: DC
  Filled 2023-04-22: qty 60, 30d supply, fill #0
  Filled 2023-05-20: qty 60, 30d supply, fill #1
  Filled 2023-06-18: qty 60, 30d supply, fill #2
  Filled 2023-07-04 – 2023-07-19 (×2): qty 60, 30d supply, fill #3
  Filled 2023-08-12 – 2023-08-19 (×2): qty 60, 30d supply, fill #0
  Filled 2023-09-26: qty 60, 30d supply, fill #1

## 2023-04-22 MED ORDER — CETIRIZINE HCL 10 MG PO TABS
10.0000 mg | ORAL_TABLET | Freq: Two times a day (BID) | ORAL | 5 refills | Status: DC
  Filled 2023-04-22: qty 60, 30d supply, fill #0
  Filled 2023-05-20: qty 60, 30d supply, fill #1
  Filled 2023-06-18: qty 60, 30d supply, fill #2
  Filled 2023-07-04 – 2023-07-19 (×2): qty 60, 30d supply, fill #3
  Filled 2023-08-12 – 2023-08-19 (×2): qty 60, 30d supply, fill #0
  Filled 2023-09-26: qty 60, 30d supply, fill #1

## 2023-04-22 NOTE — Patient Instructions (Addendum)
 Rash/itching  Etiology unclear.  Keep track of rashes and take pictures. Write down what you had done/eaten during flares.  May use benadryl cream as needed for itching.  Continue proper skin care. Use fragrance free and dye free products. No dryer sheets or fabric softener.    Continue zyrtec  (cetirizine ) 10mg  twice a day. If symptoms are not controlled or causes drowsiness let us  know. Continue Pepcid  (famotidine ) 20mg  twice a day.  Avoid the following potential triggers: alcohol, tight clothing, NSAIDs, hot showers and getting overheated. Continue proper skin care.  Get bloodwork. We are ordering labs, so please allow 1-2 weeks for the results to come back. With the newly implemented Cures Act, the labs might be visible to you at the same time that they become visible to me. However, I will not address the results until all of the results are back, so please be patient.   Recommend second opinion of dermatology.  Return in about 2 months (around 06/20/2023). Or sooner if needed.   Skin care recommendations  Bath time: Always use lukewarm water. AVOID very hot or cold water. Keep bathing time to 5-10 minutes. Do NOT use bubble bath. Use a mild soap and use just enough to wash the dirty areas. Do NOT scrub skin vigorously.  After bathing, pat dry your skin with a towel. Do NOT rub or scrub the skin.  Moisturizers and prescriptions:  ALWAYS apply moisturizers immediately after bathing (within 3 minutes). This helps to lock-in moisture. Use the moisturizer several times a day over the whole body. Good summer moisturizers include: Aveeno, CeraVe, Cetaphil. Good winter moisturizers include: Aquaphor, Vaseline, Cerave, Cetaphil, Eucerin, Vanicream. When using moisturizers along with medications, the moisturizer should be applied about one hour after applying the medication to prevent diluting effect of the medication or moisturize around where you applied the medications. When not  using medications, the moisturizer can be continued twice daily as maintenance.  Laundry and clothing: Avoid laundry products with added color or perfumes. Use unscented hypo-allergenic laundry products such as Tide free, Cheer free & gentle, and All free and clear.  If the skin still seems dry or sensitive, you can try double-rinsing the clothes. Avoid tight or scratchy clothing such as wool. Do not use fabric softeners or dyer sheets.   Control of House Dust Mite Allergen Dust mite allergens are a common trigger of allergy  and asthma symptoms. While they can be found throughout the house, these microscopic creatures thrive in warm, humid environments such as bedding, upholstered furniture and carpeting. Because so much time is spent in the bedroom, it is essential to reduce mite levels there.  Encase pillows, mattresses, and box springs in special allergen-proof fabric covers or airtight, zippered plastic covers.  Bedding should be washed weekly in hot water (130 F) and dried in a hot dryer. Allergen-proof covers are available for comforters and pillows that can't be regularly washed.  Wash the allergy -proof covers every few months. Minimize clutter in the bedroom. Keep pets out of the bedroom.  Keep humidity less than 50% by using a dehumidifier or air conditioning. You can buy a humidity measuring device called a hygrometer to monitor this.  If possible, replace carpets with hardwood, linoleum, or washable area rugs. If that's not possible, vacuum frequently with a vacuum that has a HEPA filter. Remove all upholstered furniture and non-washable window drapes from the bedroom. Remove all non-washable stuffed toys from the bedroom.  Wash stuffed toys weekly.  Pet Allergen Avoidance: Contrary to popular  opinion, there are no "hypoallergenic" breeds of dogs or cats. That is because people are not allergic to an animal's hair, but to an allergen found in the animal's saliva, dander (dead skin  flakes) or urine. Pet allergy  symptoms typically occur within minutes. For some people, symptoms can build up and become most severe 8 to 12 hours after contact with the animal. People with severe allergies can experience reactions in public places if dander has been transported on the pet owners' clothing. Keeping an animal outdoors is only a partial solution, since homes with pets in the yard still have higher concentrations of animal allergens. Before getting a pet, ask your allergist to determine if you are allergic to animals. If your pet is already considered part of your family, try to minimize contact and keep the pet out of the bedroom and other rooms where you spend a great deal of time. As with dust mites, vacuum carpets often or replace carpet with a hardwood floor, tile or linoleum. High-efficiency particulate air (HEPA) cleaners can reduce allergen levels over time. While dander and saliva are the source of cat and dog allergens, urine is the source of allergens from rabbits, hamsters, mice and guinea pigs; so ask a non-allergic family member to clean the animal's cage. If you have a pet allergy , talk to your allergist about the potential for allergy  immunotherapy (allergy  shots). This strategy can often provide long-term relief.  Reducing Pollen Exposure Pollen seasons: trees (spring), grass (summer) and ragweed/weeds (fall). Keep windows closed in your home and car to lower pollen exposure.  Install air conditioning in the bedroom and throughout the house if possible.  Avoid going out in dry windy days - especially early morning. Pollen counts are highest between 5 - 10 AM and on dry, hot and windy days.  Save outside activities for late afternoon or after a heavy rain, when pollen levels are lower.  Avoid mowing of grass if you have grass pollen allergy . Be aware that pollen can also be transported indoors on people and pets.  Dry your clothes in an automatic dryer rather than  hanging them outside where they might collect pollen.  Rinse hair and eyes before bedtime.

## 2023-04-22 NOTE — Telephone Encounter (Signed)
 This is a CHF clinic pt

## 2023-04-23 ENCOUNTER — Encounter: Payer: Self-pay | Admitting: Allergy

## 2023-04-23 ENCOUNTER — Ambulatory Visit
Admission: RE | Admit: 2023-04-23 | Discharge: 2023-04-23 | Disposition: A | Discharge status: Home / Self Care | Payer: Medicaid Other | Source: Ambulatory Visit | Attending: Internal Medicine | Admitting: Internal Medicine

## 2023-04-23 ENCOUNTER — Telehealth (HOSPITAL_COMMUNITY): Payer: Self-pay

## 2023-04-23 ENCOUNTER — Other Ambulatory Visit (HOSPITAL_COMMUNITY): Payer: Self-pay

## 2023-04-23 ENCOUNTER — Ambulatory Visit (INDEPENDENT_AMBULATORY_CARE_PROVIDER_SITE_OTHER): Payer: Medicaid Other

## 2023-04-23 DIAGNOSIS — I5022 Chronic systolic (congestive) heart failure: Secondary | ICD-10-CM | POA: Insufficient documentation

## 2023-04-23 DIAGNOSIS — Z95810 Presence of automatic (implantable) cardiac defibrillator: Secondary | ICD-10-CM

## 2023-04-23 LAB — CBC WITH DIFFERENTIAL/PLATELET
Basophils Absolute: 0.1 10*3/uL (ref 0.0–0.2)
Basos: 1 %
EOS (ABSOLUTE): 0.5 10*3/uL — ABNORMAL HIGH (ref 0.0–0.4)
Eos: 7 %
Hematocrit: 47.9 % — ABNORMAL HIGH (ref 34.0–46.6)
Hemoglobin: 16 g/dL — ABNORMAL HIGH (ref 11.1–15.9)
Immature Grans (Abs): 0 10*3/uL (ref 0.0–0.1)
Immature Granulocytes: 0 %
Lymphocytes Absolute: 1.6 10*3/uL (ref 0.7–3.1)
Lymphs: 20 %
MCH: 28.1 pg (ref 26.6–33.0)
MCHC: 33.4 g/dL (ref 31.5–35.7)
MCV: 84 fL (ref 79–97)
Monocytes Absolute: 0.7 10*3/uL (ref 0.1–0.9)
Monocytes: 9 %
Neutrophils Absolute: 5 10*3/uL (ref 1.4–7.0)
Neutrophils: 63 %
Platelets: 361 10*3/uL (ref 150–450)
RBC: 5.69 x10E6/uL — ABNORMAL HIGH (ref 3.77–5.28)
RDW: 12.5 % (ref 11.7–15.4)
WBC: 7.8 10*3/uL (ref 3.4–10.8)

## 2023-04-23 LAB — BASIC METABOLIC PANEL
Anion gap: 10 (ref 5–15)
BUN: 19 mg/dL (ref 6–20)
CO2: 33 mmol/L — ABNORMAL HIGH (ref 22–32)
Calcium: 9.8 mg/dL (ref 8.9–10.3)
Chloride: 93 mmol/L — ABNORMAL LOW (ref 98–111)
Creatinine, Ser: 1.29 mg/dL — ABNORMAL HIGH (ref 0.44–1.00)
GFR, Estimated: 49 mL/min — ABNORMAL LOW (ref >60)
Glucose, Bld: 91 mg/dL (ref 70–99)
Potassium: 4.1 mmol/L (ref 3.5–5.1)
Sodium: 136 mmol/L (ref 135–145)

## 2023-04-23 LAB — HEPATIC FUNCTION PANEL
ALT: 21 [IU]/L (ref 0–32)
AST: 26 [IU]/L (ref 0–40)
Albumin: 4.7 g/dL (ref 3.8–4.9)
Alkaline Phosphatase: 145 [IU]/L — ABNORMAL HIGH (ref 44–121)
Bilirubin Total: 0.5 mg/dL (ref 0.0–1.2)
Bilirubin, Direct: 0.16 mg/dL (ref 0.00–0.40)
Total Protein: 7.6 g/dL (ref 6.0–8.5)

## 2023-04-23 LAB — BRAIN NATRIURETIC PEPTIDE: B Natriuretic Peptide: 54.1 pg/mL (ref 0.0–100.0)

## 2023-04-23 MED ORDER — METOLAZONE 2.5 MG PO TABS: ORAL_TABLET | ORAL | 0 refills | Status: DC

## 2023-04-23 NOTE — Telephone Encounter (Signed)
 error

## 2023-04-24 ENCOUNTER — Ambulatory Visit: Payer: Medicaid Other | Admitting: Internal Medicine

## 2023-04-24 NOTE — Progress Notes (Deleted)
 Office Visit Note  Patient: Christine [name]             Date of Birth: Mar 16, 1966           MRN: [id number]             PCP: Vicci Barnie NOVAK, MD Referring: Vicci Barnie NOVAK, MD Visit Date: 04/24/2023   Subjective:  No chief complaint on file.   History of Present Illness: [name] is a 58 y.o. female here for follow up ***   Previous HPI 02/21/23 [name] is a 58 y.o. female here for evaluation of raynaud's symptoms with a history of diffuse rashes and complicated crohn's disease on entyvio .  Original Crohn's starting May 2023 with loose watery stools found to have ileitis and distal ileum stricture.  Does have significant medical history including congestive heart failure (LVEF < 20%). She has been dealing with diffuse rashes ongoing since around March of this year.  These have broken out in several different areas but the most prominent being a large flat erythematous patch on the left thigh lasting for hours at a time.  Primarily itching sometimes with burning sensation.  Did not notice any difference in symptoms before versus after starting Entyvio  treatment.  She did not see any resolution with prednisone .  So far she is try to treat treatments including loratadine  and Benadryl were not helpful for the visible rash or the itching.  She did see dermatology for a skin biopsy apparently this indicated some type of inflammation and was prescribed topical triamcinolone  as a treatment.  Was not recommended any other systemic therapy and she is not sure about specific diagnosis.  She did have some lab test including antibody test with negative Sjogren syndrome markers. Besides these rashes she has been seeing increased frequency of toe discoloration becoming blue with numbness and tingling sensation.  Not associated with any visible ulcers or pitting. Also noted to have mild eosinophilia in July labs and appointment with allergy  clinic in December.   No Rheumatology ROS  completed.   PMFS History:  Patient Active Problem List   Diagnosis Date Noted   Crohn's disease involving terminal ileum (HCC) 07/23/2022   Crohn's disease of large intestine with other complication (HCC) 05/11/2022   Mild major depression (HCC) 05/11/2022   Paroxysmal tachycardia, unspecified (HCC) 05/11/2022   Essential hypertension 03/02/2022   Acute pancreatitis 03/01/2022   Hypokalemia 03/01/2022   Ileitis 03/01/2022   Diarrhea 02/26/2022   Nausea without vomiting 02/26/2022   Loss of weight 02/26/2022   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   Ulcer of ileum 02/26/2022   Diverticulosis of colon without hemorrhage 02/26/2022   Cardiomyopathy (HCC) ischemic and non-ischemic 01/16/2022   Abdominal aortic aneurysm (AAA) 3.0 cm to 5.0 cm in diameter in female (HCC) 01/09/2022   Polycythemia 01/09/2022   Chronic combined systolic and diastolic heart failure (HCC) 08/24/2021   CAD in native artery 08/24/2021   S/P ICD (internal cardiac defibrillator) procedure 08/10/2021   Obstructive sleep apnea 05/05/2021   Former smoker 02/06/2021   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   Obesity (BMI 30.0-34.9) 02/06/2021   Anxiety 01/20/2021   Tobacco abuse 01/20/2021   Shortness of breath 01/20/2021   Chest tightness     Past Medical History:  Diagnosis Date   Allergy     Anxiety 01/20/2021   CAD in native artery 08/24/2021   CHF (congestive heart failure) (HCC)    Chronic combined systolic and diastolic heart failure (HCC) 08/24/2021   Complication  of anesthesia    Crohn's colitis (HCC)    Depression    Hyperlipidemia    Myocardial infarction [facility])    Presence of cardiac defibrillator 07/2021   Shortness of breath 01/20/2021   Sleep apnea 07/2021   Tobacco abuse 01/20/2021   Urticaria     Family History  Problem Relation Age of Onset   Eczema Mother    Stroke Mother    Atrial fibrillation Mother    Heart failure Father    Asthma Brother    Stomach cancer Maternal  Grandfather    Liver cancer Neg Hx    Esophageal cancer Neg Hx    Colon polyps Neg Hx    Colon cancer Neg Hx    Allergic rhinitis Neg Hx    Angioedema Neg Hx    Urticaria Neg Hx    Past Surgical History:  Procedure Laterality Date   ABDOMINAL HYSTERECTOMY     BIOPSY  02/26/2022   Procedure: BIOPSY;  Surgeon: San Sandor GAILS, DO;  Location: [facility];  Service: Gastroenterology;;   CARDIAC CATHETERIZATION     CHOLECYSTECTOMY     COLONOSCOPY WITH PROPOFOL  N/A 02/26/2022   Procedure: COLONOSCOPY WITH PROPOFOL ;  Surgeon: San Sandor GAILS, DO;  Location: [facility];  Service: Gastroenterology;  Laterality: N/A;   ESOPHAGOGASTRODUODENOSCOPY (EGD) WITH PROPOFOL  N/A 02/26/2022   Procedure: ESOPHAGOGASTRODUODENOSCOPY (EGD) WITH PROPOFOL ;  Surgeon: San Sandor GAILS, DO;  Location: [facility];  Service: Gastroenterology;  Laterality: N/A;   ICD IMPLANT N/A 07/21/2021   Procedure: ICD IMPLANT;  Surgeon: Fernande Elspeth BROCKS, MD;  Location: [facility] INVASIVE CV LAB;  Service: Cardiovascular;  Laterality: N/A;   NECK SURGERY     OVARIAN CYST SURGERY     POLYPECTOMY  02/26/2022   Procedure: POLYPECTOMY;  Surgeon: San Sandor GAILS, DO;  Location: [facility];  Service: Gastroenterology;;   RIGHT/LEFT HEART CATH AND CORONARY ANGIOGRAPHY N/A 01/23/2021   Procedure: RIGHT/LEFT HEART CATH AND CORONARY ANGIOGRAPHY;  Surgeon: Cherrie Toribio SAUNDERS, MD;  Location: [facility] INVASIVE CV LAB;  Service: Cardiovascular;  Laterality: N/A;   TONSILLECTOMY     Social History   Social History Narrative   Not on file   Immunization History  Administered Date(s) Administered   Hepb-cpg 10/04/2022, 11/06/2022   Influenza, Seasonal, Injecte, Preservative Fre 01/15/2023   Influenza,inj,Quad PF,6+ Mos 02/06/2021, 01/09/2022   PFIZER(Purple Top)SARS-COV-2 Vaccination 08/29/2019, 09/23/2019, 04/02/2020   PNEUMOCOCCAL CONJUGATE-20 02/06/2021   Tdap 09/05/2021   Zoster Recombinant(Shingrix ) 05/11/2022, 01/29/2023      Objective: Vital Signs: There were no vitals taken for this visit.   Physical Exam   Musculoskeletal Exam: ***  CDAI Exam: CDAI Score: -- Patient Global: --; Provider Global: -- Swollen: --; Tender: -- Joint Exam 04/24/2023   No joint exam has been documented for this visit   There is currently no information documented on the homunculus. Go to the Rheumatology activity and complete the homunculus joint exam.  Investigation: No additional findings.  Imaging: CUP PACEART REMOTE DEVICE CHECK Result Date: 04/19/2023 Scheduled remote reviewed. Normal device function.  Next remote 91 days. [facility], [facility]   Recent Labs: Lab Results  Component Value Date   WBC 7.8 04/22/2023   HGB 16.0 (H) 04/22/2023   PLT 361 04/22/2023   NA 136 04/23/2023   K 4.1 04/23/2023   CL 93 (L) 04/23/2023   CO2 33 (H) 04/23/2023   GLUCOSE 91 04/23/2023   BUN 19 04/23/2023   CREATININE 1.29 (H) 04/23/2023   BILITOT 0.5 04/22/2023   ALKPHOS 145 (  H) 04/22/2023   AST 26 04/22/2023   ALT 21 04/22/2023   PROT 7.6 04/22/2023   ALBUMIN 4.7 04/22/2023   CALCIUM  9.8 04/23/2023   GFRAA  03/31/2008    >60        The eGFR has been calculated using the MDRD equation. This calculation has not been validated in all clinical   QFTBGOLDPLUS NEGATIVE 07/10/2022    Speciality Comments: No specialty comments available.  Procedures:  No procedures performed Allergies: Gabapentin, Sulfa antibiotics, Erythromycin, Tramadol , Ibuprofen, and Tape   Assessment / Plan:     Visit Diagnoses: No diagnosis found.  ***  Orders: No orders of the defined types were placed in this encounter.  No orders of the defined types were placed in this encounter.    Follow-Up Instructions: No follow-ups on file.   Lonni LELON Ester, MD  Note - This record has been created using [employer].  Chart creation errors have been sought, but may not always  have been located. Such creation errors do not reflect on  the  standard of medical care.

## 2023-04-25 ENCOUNTER — Ambulatory Visit (HOSPITAL_COMMUNITY)
Admission: RE | Admit: 2023-04-25 | Discharge: 2023-04-25 | Disposition: A | Discharge status: Home / Self Care | Payer: Medicaid Other | Source: Ambulatory Visit | Attending: Gastroenterology | Admitting: Gastroenterology

## 2023-04-25 DIAGNOSIS — K5090 Crohn's disease, unspecified, without complications: Secondary | ICD-10-CM | POA: Diagnosis not present

## 2023-04-25 MED ORDER — VEDOLIZUMAB 300 MG IV SOLR
300.0000 mg | INTRAVENOUS | Status: DC
  Administered 2023-04-25: 300 mg via INTRAVENOUS
  Filled 2023-04-25: qty 5

## 2023-04-26 NOTE — Progress Notes (Signed)
 EPIC Encounter for ICM Monitoring  Patient Name: Christine [name] is a 58 y.o. female Date: 04/26/2023 Primary Care Physican: Vicci Barnie NOVAK, MD Primary Cardiologist: [place] Electrophysiologist: Fernande Last Weight: 185.5 lbs 12/19/2022 Weight: 179 lbs (baseline 177 lbs) 02/06/2023 Weight: 182.5 lbs 02/13/2023 Weight: 181 lbs  03/12/2023 Weight: 183 lbs (baseline 177 lbs) 04/16/2023 Weight: 183 lbs                                                            Transmission results reviewed.     HeartLogic Heart Failure Index remains 0 suggesting fluid levels are within normal threshold range.      Prescribed:  Furosemide  20 mg take 4 tablet(s) (80 mg total) by mouth twice a day Potassium 20 mEq take 5 tablet(s) (100 mEq total) by mouth three times daily Metolazone  2.5 mg take 1 tablet by weekly.  Take additional 40 mEq of Potassium with each dose Spironolactone  25 mg take 1 tablet by mouth daily.    Labs: 04/23/2023 Creatinine 1.29, BUN 19, Potassium 4.1, Sodium 136, GFR 49 04/08/2023 Creatinine 1.00, BUN 16, Potassium 3.5, Sodium 135, GFR >60  03/29/2023 Creatinine 1.22, BUN 26, Potassium 3.2, Sodium 136, GFR 52  03/25/2023 Creatinine 1.36, BUN 24, Potassium 4.4, Sodium 139  03/18/2023 Creatinine 0.96, BUN 10, Potassium 3.6, Sodium 140, GFR 57 03/06/2023 Creatinine 1.13, BUN 15, Potassium 4.1, Sodium 139, GFR 57 A complete set of results can be found in Results Review.   Recommendations:  No changes    Follow-up plan: ICM clinic phone appointment on 05/20/2023.   91 day device clinic remote transmission 07/22/2023.              EP/Cardiology next office visit:  05/22/2023 with Dr Cherrie.  Provided EP scheduler number 12/30 and advised to call for overdue appt with Dr Fernande.  Last OV with Dr Fernande was 02/07/2022          Copy of ICM check sent to Dr. Fernande.  3 Month HeartLogicT Heart Failure Index:    8 Day Data Trend:          Christine GORMAN Garner, RN 04/26/2023 11:29  AM

## 2023-04-29 DIAGNOSIS — G4733 Obstructive sleep apnea (adult) (pediatric): Secondary | ICD-10-CM | POA: Diagnosis not present

## 2023-05-01 ENCOUNTER — Encounter (HOSPITAL_COMMUNITY): Payer: Self-pay | Admitting: Internal Medicine

## 2023-05-01 DIAGNOSIS — I5022 Chronic systolic (congestive) heart failure: Secondary | ICD-10-CM

## 2023-05-03 ENCOUNTER — Other Ambulatory Visit: Payer: Self-pay | Admitting: Internal Medicine

## 2023-05-03 MED ORDER — METOLAZONE 2.5 MG PO TABS: 2.5000 mg | ORAL_TABLET | ORAL | 0 refills | Status: DC

## 2023-05-03 MED ORDER — METOLAZONE 2.5 MG PO TABS: ORAL_TABLET | ORAL | 0 refills | Status: DC

## 2023-05-07 ENCOUNTER — Other Ambulatory Visit: Payer: Self-pay | Admitting: Internal Medicine

## 2023-05-07 DIAGNOSIS — R232 Flushing: Secondary | ICD-10-CM

## 2023-05-08 ENCOUNTER — Other Ambulatory Visit: Payer: Self-pay

## 2023-05-08 ENCOUNTER — Other Ambulatory Visit (HOSPITAL_COMMUNITY): Payer: Self-pay

## 2023-05-08 MED ORDER — VEOZAH 45 MG PO TABS
45.0000 mg | ORAL_TABLET | Freq: Every day | ORAL | 2 refills | Status: DC
  Filled 2023-05-08: qty 30, 30d supply, fill #0
  Filled 2023-06-03: qty 30, 30d supply, fill #1
  Filled 2023-07-04: qty 30, 30d supply, fill #2

## 2023-05-10 ENCOUNTER — Other Ambulatory Visit (HOSPITAL_COMMUNITY): Payer: Self-pay

## 2023-05-10 ENCOUNTER — Telehealth (HOSPITAL_COMMUNITY): Payer: Self-pay

## 2023-05-10 ENCOUNTER — Ambulatory Visit (INDEPENDENT_AMBULATORY_CARE_PROVIDER_SITE_OTHER): Payer: Medicaid Other | Admitting: Gastroenterology

## 2023-05-10 ENCOUNTER — Ambulatory Visit (HOSPITAL_COMMUNITY)
Admission: RE | Admit: 2023-05-10 | Discharge: 2023-05-10 | Disposition: A | Discharge status: Home / Self Care | Payer: Medicaid Other | Source: Ambulatory Visit | Attending: Cardiology | Admitting: Cardiology

## 2023-05-10 ENCOUNTER — Encounter: Payer: Self-pay | Admitting: Gastroenterology

## 2023-05-10 VITALS — HR 81 | Ht 70.0 in | Wt 181.0 lb

## 2023-05-10 DIAGNOSIS — K50012 Crohn's disease of small intestine with intestinal obstruction: Secondary | ICD-10-CM | POA: Diagnosis not present

## 2023-05-10 DIAGNOSIS — Z79899 Other long term (current) drug therapy: Secondary | ICD-10-CM | POA: Diagnosis not present

## 2023-05-10 DIAGNOSIS — I5022 Chronic systolic (congestive) heart failure: Secondary | ICD-10-CM

## 2023-05-10 DIAGNOSIS — I5042 Chronic combined systolic (congestive) and diastolic (congestive) heart failure: Secondary | ICD-10-CM

## 2023-05-10 DIAGNOSIS — I2510 Atherosclerotic heart disease of native coronary artery without angina pectoris: Secondary | ICD-10-CM

## 2023-05-10 DIAGNOSIS — K5000 Crohn's disease of small intestine without complications: Secondary | ICD-10-CM

## 2023-05-10 LAB — BASIC METABOLIC PANEL
Anion gap: 13 (ref 5–15)
BUN: 31 mg/dL — ABNORMAL HIGH (ref 6–20)
CO2: 32 mmol/L (ref 22–32)
Calcium: 9.4 mg/dL (ref 8.9–10.3)
Chloride: 89 mmol/L — ABNORMAL LOW (ref 98–111)
Creatinine, Ser: 1.58 mg/dL — ABNORMAL HIGH (ref 0.44–1.00)
GFR, Estimated: 38 mL/min — ABNORMAL LOW (ref >60)
Glucose, Bld: 105 mg/dL — ABNORMAL HIGH (ref 70–99)
Potassium: 3.3 mmol/L — ABNORMAL LOW (ref 3.5–5.1)
Sodium: 134 mmol/L — ABNORMAL LOW (ref 135–145)

## 2023-05-10 MED ORDER — METOLAZONE 2.5 MG PO TABS
2.5000 mg | ORAL_TABLET | ORAL | 0 refills | Status: DC
  Filled 2023-05-10 – 2023-06-03 (×2): qty 8, 28d supply, fill #0

## 2023-05-10 NOTE — Patient Instructions (Signed)
We will contact you in April to have repeat labs drawn.   _______________________________________________________  If your blood pressure at your visit was 140/90 or greater, please contact your primary care physician to follow up on this.  _______________________________________________________  If you are age 56 or older, your body mass index should be between 23-30. Your Body mass index is 25.97 kg/m. If this is out of the aforementioned range listed, please consider follow up with your Primary Care Provider.  If you are age 40 or younger, your body mass index should be between 19-25. Your Body mass index is 25.97 kg/m. If this is out of the aformentioned range listed, please consider follow up with your Primary Care Provider.   ________________________________________________________  The [place] [facility] providers would like to encourage you to use [facility] to communicate with providers for non-urgent requests or questions.  Due to long hold times on the telephone, sending your provider a message by [facility] may be a faster and more efficient way to get a response.  Please allow 48 business hours for a response.  Please remember that this is for non-urgent requests.  _______________________________________________________

## 2023-05-10 NOTE — Progress Notes (Signed)
Chief Complaint:    Crohn's disease  GI History: Ms. Mcquary is a 58 year old female with HFrEF (LVEF <20%) and CAD c/b LAD occlusion s/p ICD, chronic tobacco use,OSA on CPAP, history of LV thrombus on eliquis, Raynaud's, and ADHD, anxiety/depression, and PTSD follows in the GI clinic for the following:   1) Crohn's Disease (stricturing ileitis).  Started with watery diarrhea in 08/2021.  Index symptoms of 8-10 loose, watery stools with fecal urgency.  No nocturnal symptoms.  Loss 6# with decreased p.o. intake. - 11/28/2021: ER evaluation for diarrhea.  WBC 13.3, ALT 56, ALP 127, otherwise unremarkable work-up.  Was given Keflex to treat UTI - 11/29/2021: Negative/normal GI PCR panel, O&P - 12/14/2021: Initial appointment in the GI clinic - 12/21/2021: CT A/P: Moderate colonic stool burden but otherwise no intra-abdominal pathology - 02/26/2022: EGD: Normal - 02/26/2022: Colonoscopy: 3 mm sigmoid HP, sigmoid diverticulosis, otherwise normal colon (path benign).  Terminal ileum with moderate stenosis measuring 1 cm in length by 5 mm in diameter which was not traversable.  This was located 3 cm from the ileocecal valve.  Terminal ileum contained a few ulcers proximal and distal to stricture (path: Acute ileitis with ulceration) - 03/01/2022: CT: No acute intra-abdominal pathology.  Stable 3.1 cm infrarenal abdominal aortic aneurysm.  Submucosal fatty infiltration in the terminal ileum with superimposed mucosal hyperemia suspicious for mild, acute on chronic terminal ileitis - 03/02/2022: CT enterography: Ileitis throughout a 15 cm segment of ileum with 2 segments of luminal narrowing/stricturing through this segment of distal ileum, c/w stricturing, ileal Crohn's Disease.  Negative/normal lactoferrin, calprotectin, repeat GI PCR panel. Fecal pancreatic elastase <50.  Was started on Entocort 9 mg/day x 8 weeks - 03/13/2022: GI follow-up appointment.  Reports overall decreased stool frequency with  Entocort.  ESR 52, normal CRP. - 07/04/2022: GI follow-up.  Was doing well, but return of diarrhea couple days later.  Restarted budesonide with good clinical response - 07/18/2022: CT enterography: Mucosal hyperenhancement and 2 short segment strictures in the TI, unchanged from previous.  Mild distal small bowel partial obstruction without penetrating disease, fistula, abscess - 07/20/2022: GI follow-up appointment.  Calprotectin 294.  Started on [place].  C. difficile positive.  Treated with vancomycin - 12/2022: Vedolizumab trough 12, no antibody    HPI:     Patient is a 58 y.o. female presenting to the Gastroenterology Clinic for follow-up.  Last seen by me on 10/04/2022.  Feeling well today and no active issues or concerns.  Tolerating Entyvio without issue.  Crohn's well-controlled since initiating Entyvio. Just had infusion earlier this month.   Vedolizumab trough at goal in 12/2022.  ESR/CRP normal in 02/2023.  Still taking 1 Creon with meals. If she stops, the diarrhea recurs.   Review of systems:     No chest pain, no SOB, no fevers, no urinary sx   Past Medical History:  Diagnosis Date   Allergy    Anxiety 01/20/2021   CAD in native artery 08/24/2021   CHF (congestive heart failure) (HCC)    Chronic combined systolic and diastolic heart failure (HCC) 08/24/2021   Complication of anesthesia    Crohn's colitis (HCC)    Depression    Hyperlipidemia    Myocardial infarction [facility])    Presence of cardiac defibrillator 07/2021   Shortness of breath 01/20/2021   Sleep apnea 07/2021   Tobacco abuse 01/20/2021   Urticaria     Patient's surgical history, family medical history, social history, medications and allergies were all reviewed  in Epic    Current Outpatient Medications  Medication Sig Dispense Refill   Accu-Chek Softclix Lancets lancets Use as instructed.  Check blood sugar daily before breakfast. 100 each 12   acetaminophen (TYLENOL) 500 MG tablet Take 1,000 mg by  mouth 3 (three) times daily as needed for moderate pain or headache.     ALPRAZolam (XANAX) 1 MG tablet Take 2 mg by mouth 3 (three) times daily.     alum & mag hydroxide-simeth (MAALOX/MYLANTA) 200-200-20 MG/5ML suspension Take 30 mLs by mouth every 4 (four) hours as needed for indigestion or heartburn. 355 mL 0   apixaban (ELIQUIS) 5 MG TABS tablet Take 1 tablet (5 mg total) by mouth 2 (two) times daily. 60 tablet 11   atorvastatin (LIPITOR) 80 MG tablet Take 1 tablet (80 mg total) by mouth daily. 90 tablet 3   Blood Glucose Monitoring Suppl (ACCU-CHEK GUIDE) w/Device KIT Check blood sugar once daily before breakfast 1 kit 0   buPROPion (WELLBUTRIN XL) 150 MG 24 hr tablet Take 150 mg by mouth daily.     cetirizine (ZYRTEC ALLERGY) 10 MG tablet Take 1 tablet (10 mg total) by mouth 2 (two) times daily. 60 tablet 5   dapagliflozin propanediol (FARXIGA) 10 MG TABS tablet Take 1 tablet (10 mg total) by mouth daily. 30 tablet 9   diphenoxylate-atropine (LOMOTIL) 2.5-0.025 MG tablet Take 2 tablets by mouth 4 (four) times daily as needed for diarrhea/loose stools. 60 tablet 3   famotidine (PEPCID) 20 MG tablet Take 1 tablet (20 mg total) by mouth 2 (two) times daily. 60 tablet 5   feeding supplement (ENSURE ENLIVE / ENSURE PLUS) LIQD Take 237 mLs by mouth 2 (two) times daily between meals. 237 mL 12   Fezolinetant (VEOZAH) 45 MG TABS Take 1 tablet (45 mg total) by mouth daily. 30 tablet 2   furosemide (LASIX) 20 MG tablet Take 4 tablets (80 mg total) by mouth 2 (two) times daily. 240 tablet 3   glucose blood (ACCU-CHEK GUIDE) test strip Use as instructed to check blood sugar daily before breakfast 100 each 12   ivabradine (CORLANOR) 5 MG TABS tablet Take 1/2 tablet (2.5 mg total) by mouth 2 (two) times daily. 30 tablet 5   lipase/protease/amylase (CREON) 36000 UNITS CPEP capsule Take 2 capsules (72,000 Units total) by mouth with meals AND 1 capsule (36,000 Units total) with snacks. 300 capsule 5    metoprolol succinate (TOPROL-XL) 25 MG 24 hr tablet Take 1/2 tablet (12.5 mg total) by mouth daily. 90 tablet 0   metroNIDAZOLE (METROGEL) 1 % gel Apply 1 Application topically as needed (rash).     Multiple Vitamins-Minerals (MULTIVITAMIN WITH MINERALS) tablet Take 1 tablet by mouth daily.     oxymetazoline (AFRIN) 0.05 % nasal spray Place 1-2 sprays into both nostrils 2 (two) times daily as needed for congestion.     potassium chloride SA (KLOR-CON M) 20 MEQ tablet Take 6 tablets (120 mEq total) by mouth every morning AND 5 tablets (100 mEq total) daily at 12 noon AND 6 tablets (120 mEq total) every evening. 510 tablet 3   prazosin (MINIPRESS) 2 MG capsule Take 2-6 mg by mouth daily as needed (for sleep).     promethazine (PHENERGAN) 25 MG tablet Take 1 tablet (25 mg total) by mouth every 6 (six) to 8 (eight) hours for nausea or vomiting. (Patient taking differently: Take 25 mg by mouth every 6 (six) hours as needed for nausea or vomiting.) 90 tablet 2  sitaGLIPtin (JANUVIA) 50 MG tablet Take 1 tablet (50 mg total) by mouth daily. 90 tablet 0   spironolactone (ALDACTONE) 25 MG tablet Take 1 tablet (25 mg total) by mouth daily. 90 tablet 3   triamcinolone cream (KENALOG) 0.1 % Apply 1 Application (1 gram) topically 2 (two) times daily. 80 g 1   vedolizumab (ENTYVIO) 300 MG injection Inject 300 mg into the vein every 2 (two) months.     zolpidem (AMBIEN) 10 MG tablet Take 5-10 mg by mouth at bedtime.     metolazone (ZAROXOLYN) 2.5 MG tablet Take 1 tablet (2.5 mg total) by mouth 3 (three) times a week for 3 days. Take with additional 40 meq of potassium 12 tablet 0   No current facility-administered medications for this visit.    Physical Exam:     Pulse 81   Ht 5\' 10"  (1.778 m)   Wt 181 lb (82.1 kg)   BMI 25.97 kg/m   GENERAL:  Pleasant female in NAD PSYCH: : Cooperative, normal affect NEURO: Alert and oriented x 3, no focal neurologic deficits   IMPRESSION and PLAN:    1) Crohn's  Disease 2) Ileitis 58 year old female with stricturing ileal Crohn's Disease with good clinical response to Entyvio. - Resume Entyvio as scheduled - She is planning on reestablishing with a new Dermatologist in the near future.  Continue with at least annual screening/skin checks - Discussed the role/utility of colonoscopy to evaluate for deep remission on Entyvio.  While we typically recommend 6-12 months after initiation/change in therapy, given her significant comorbidities, this is certainly not without risk.  After in-depth conversation and shared decision making, we both agree that noninvasive monitoring is the better option. - Check ESR, CRP, fecal calprotectin, along with vedolizumab trough and Ab level in the spring, approximately 1-2 weeks prior to vedolizumab infusion - Can also utilize noninvasive CT enterography as another option for monitoring - If requiring colonoscopy, will certainly need to be done at [facility].  Per her previous conversation with Dr. Gala Romney, that may even require admission to the Heart Failure unit for perioperative observation      3) CHF 4) CAD - Continue close follow-up with Dr. Gala Romney - No anti-TNF   5) EPI As previously discussed, very unclear if she truly has EPI as the fecal elastase was very low.  Nonetheless, has had a good response to Creon - Continue Creon for the time being  RTC in 12 months or sooner as needed      Shellia Cleverly ,DO, FACG 05/10/2023, 10:53 AM

## 2023-05-10 NOTE — Telephone Encounter (Signed)
-----   Message from Jacklynn Ganong sent at 05/10/2023  2:46 PM EST ----- Labs consistent with AKI. K is low. Needs to go back to metolazone 2x/week  Will repeat labs at her follow up with Dr Gala Romney

## 2023-05-10 NOTE — Telephone Encounter (Signed)
Metolazone has been changed in pt's chart and updated and sent to her pharmacy. Pt aware, agreeable, and verbalized understanding.

## 2023-05-15 ENCOUNTER — Encounter: Payer: Self-pay | Admitting: Internal Medicine

## 2023-05-17 ENCOUNTER — Other Ambulatory Visit: Payer: Self-pay

## 2023-05-17 ENCOUNTER — Other Ambulatory Visit (HOSPITAL_COMMUNITY): Payer: Self-pay

## 2023-05-18 DIAGNOSIS — Z419 Encounter for procedure for purposes other than remedying health state, unspecified: Secondary | ICD-10-CM | POA: Diagnosis not present

## 2023-05-20 ENCOUNTER — Other Ambulatory Visit: Payer: Self-pay

## 2023-05-20 ENCOUNTER — Ambulatory Visit: Payer: Medicaid Other | Attending: Internal Medicine | Admitting: Internal Medicine

## 2023-05-20 ENCOUNTER — Other Ambulatory Visit (HOSPITAL_COMMUNITY): Payer: Self-pay

## 2023-05-20 ENCOUNTER — Ambulatory Visit: Payer: Medicaid Other | Attending: Internal Medicine

## 2023-05-20 ENCOUNTER — Encounter: Payer: Self-pay | Admitting: Internal Medicine

## 2023-05-20 VITALS — BP 91/59 | HR 81 | Temp 98.2°F | Ht 70.0 in | Wt 183.0 lb

## 2023-05-20 DIAGNOSIS — E119 Type 2 diabetes mellitus without complications: Secondary | ICD-10-CM

## 2023-05-20 DIAGNOSIS — I2510 Atherosclerotic heart disease of native coronary artery without angina pectoris: Secondary | ICD-10-CM | POA: Diagnosis not present

## 2023-05-20 DIAGNOSIS — Z7984 Long term (current) use of oral hypoglycemic drugs: Secondary | ICD-10-CM

## 2023-05-20 DIAGNOSIS — I5022 Chronic systolic (congestive) heart failure: Secondary | ICD-10-CM

## 2023-05-20 DIAGNOSIS — E876 Hypokalemia: Secondary | ICD-10-CM

## 2023-05-20 DIAGNOSIS — G4733 Obstructive sleep apnea (adult) (pediatric): Secondary | ICD-10-CM | POA: Diagnosis not present

## 2023-05-20 DIAGNOSIS — Z95810 Presence of automatic (implantable) cardiac defibrillator: Secondary | ICD-10-CM | POA: Diagnosis not present

## 2023-05-20 DIAGNOSIS — E1169 Type 2 diabetes mellitus with other specified complication: Secondary | ICD-10-CM | POA: Diagnosis not present

## 2023-05-20 DIAGNOSIS — I479 Paroxysmal tachycardia, unspecified: Secondary | ICD-10-CM

## 2023-05-20 DIAGNOSIS — N1832 Chronic kidney disease, stage 3b: Secondary | ICD-10-CM | POA: Diagnosis not present

## 2023-05-20 DIAGNOSIS — Z8619 Personal history of other infectious and parasitic diseases: Secondary | ICD-10-CM | POA: Diagnosis not present

## 2023-05-20 LAB — POCT GLYCOSYLATED HEMOGLOBIN (HGB A1C): HbA1c, POC (controlled diabetic range): 6.4 % (ref 0.0–7.0)

## 2023-05-20 LAB — GLUCOSE, POCT (MANUAL RESULT ENTRY): POC Glucose: 105 mg/dL — AB (ref 70–99)

## 2023-05-20 MED ORDER — VALACYCLOVIR HCL 500 MG PO TABS
500.0000 mg | ORAL_TABLET | Freq: Two times a day (BID) | ORAL | 2 refills | Status: AC
  Filled 2023-05-20 – 2023-10-06 (×3): qty 6, 3d supply, fill #0
  Filled 2023-12-16: qty 6, 3d supply, fill #1

## 2023-05-20 MED ORDER — METOPROLOL SUCCINATE ER 25 MG PO TB24
12.5000 mg | ORAL_TABLET | Freq: Every day | ORAL | 1 refills | Status: DC
  Filled 2023-05-20 – 2023-05-22 (×5): qty 45, 90d supply, fill #0
  Filled 2023-08-02: qty 45, 90d supply, fill #1

## 2023-05-20 NOTE — Patient Instructions (Signed)
Please speak with Dr. Mayford Knife about the fatigue/tiredness.  You may need to have your CPAP machine pressure increased.

## 2023-05-20 NOTE — Progress Notes (Signed)
Patient ID: Christine [name], female    DOB: 05-17-1965  MRN: [id number]  CC: Diabetes (DM & HTN f/u.  Med refill. Beverely Pace of chest cramp waves from collarbones to waist for 15 - 20 min/Requesting Valtrex /Already received flu vax)   Subjective: Christine [name] is a 58 y.o. female who presents for chronic ds management. 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   Discussed the use of AI scribe software for clinical note transcription with the patient, who gave verbal consent to proceed.  History of Present Illness   The patient, with a history of diabetes, congestive heart failure, Crohn's disease, and genital herpes, presents with a recent episode of chest cramps that occurred once this past wkend The chest discomfort, described as 'cramps,' started at the collarbone and radiated down to the waist, lasting for about 15-20 minutes. The patient denies pain and sharp stabbing sensations, describing the episode as 'extremely uncomfortable.' She has not had a similar episode since.  The patient also reports increased fatigue over the past couple of months, feeling extremely sleepy and not refreshed upon waking, despite using a CPAP machine for the past two years. On Auto-pap 4-12. She has not started any new medications recently.  She wonders if it is due to all the medicines that she is on.  Besides her heart medication, her psych medications include Ambien 5 to 10 mg at bedtime, prazosin 2 to 6 mg daily as needed, Xanax 2 mg 3 times a day  DM: Results for orders placed or performed in visit on 05/20/23  POCT glucose (manual entry)   Collection Time: 05/20/23  1:37 PM  Result Value Ref  Range   POC Glucose 105 (A) 70 - 99 mg/dl  POCT glycosylated hemoglobin (Hb A1C)   Collection Time: 05/20/23  2:22 PM  Result Value Ref Range   Hemoglobin A1C     HbA1c POC (<> result, manual entry)     HbA1c, POC (prediabetic range)     HbA1c, POC (controlled diabetic range) 6.4 0.0 - 7.0 %  The patient's diabetes is well-controlled with an A1C of 6.4 on Januvia and Farxiga.  Does well with her eating habits.  She has been experiencing recurrent episodes of low potassium, which she manages with a high dose of potassium supplements taking 20 meq 6 tablets in the morning, 5 tablets in the afternoon and 6 tablets at bedtime. Her kidney function has been fluctuating, with a recent GFR of 38.  Within the past month and a half, GFR has been between greater than 60-38.  Saw nephrology since last visit with me.  Advised to continue potassium supplement.  They tried to increase the spironolactone to 50 mg daily but patient states it caused dizziness for her so it was decreased back to 25 mg daily.  Her Crohn's disease is stable on Entyvio.  History of genital herpes: she has not had a herpes outbreak since the 1990s, until a recent episode a few months ago. She requests a prescription for Valtrex to keep on hand for future outbreaks.     CAD/chronic systolic CHF/LV thrombus/HL:  Reports compliance with atorvastatin 80 mg daily, furosemide 80 mg twice a day, metoprolol 25 mg 1/2  daily (she accidentally has been taking a full tab daily), metolazone 2.5 mg 2 times a week, spironolactone 25 mg daily, Farxiga 10 mg daily and Eliquis 5 mg twice a day.  SVT on Holter monitor in the past.  She was placed on metoprolol and has not experienced any frequent episodes of palpitations since then. -LE edema at times; wgh fluctuates. Sleeps on a form wedge.  Takes the Metolazone PRN  Hot Flashes:  doing well on Veozah. Last LFTs done last mth  Patient Active Problem List   Diagnosis Date Noted   Crohn's disease  involving terminal ileum (HCC) 07/23/2022   Crohn's disease of large intestine with other complication (HCC) 05/11/2022   Mild major depression (HCC) 05/11/2022   Paroxysmal tachycardia, unspecified (HCC) 05/11/2022   Essential hypertension 03/02/2022   Acute pancreatitis 03/01/2022   Hypokalemia 03/01/2022   Ileitis 03/01/2022   Diarrhea 02/26/2022   Nausea without vomiting 02/26/2022   Loss of weight 02/26/2022   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   Ulcer of ileum 02/26/2022   Diverticulosis of colon without hemorrhage 02/26/2022   Cardiomyopathy (HCC) ischemic and non-ischemic 01/16/2022   Abdominal aortic aneurysm (AAA) 3.0 cm to 5.0 cm in diameter in female (HCC) 01/09/2022   Polycythemia 01/09/2022   Chronic combined systolic and diastolic heart failure (HCC) 08/24/2021   CAD in native artery 08/24/2021   S/P ICD (internal cardiac defibrillator) procedure 08/10/2021   Obstructive sleep apnea 05/05/2021   Former smoker 02/06/2021   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   Obesity (BMI 30.0-34.9) 02/06/2021   Anxiety 01/20/2021   Tobacco abuse 01/20/2021   Shortness of breath 01/20/2021   Chest tightness      Current Outpatient Medications on File Prior to Visit  Medication Sig Dispense Refill   Accu-Chek Softclix Lancets lancets Use as instructed.  Check blood sugar daily before breakfast. 100 each 12   acetaminophen (TYLENOL) 500 MG tablet Take 1,000 mg by mouth 3 (three) times daily as needed for moderate pain or headache.     ALPRAZolam (XANAX) 1 MG tablet Take 2 mg by mouth 3 (three) times daily.     alum & mag hydroxide-simeth (MAALOX/MYLANTA) 200-200-20 MG/5ML suspension Take 30 mLs by mouth every 4 (four) hours as needed for indigestion or heartburn. 355 mL 0   apixaban (ELIQUIS) 5 MG TABS tablet Take 1 tablet (5 mg total) by mouth 2 (two) times daily. 60 tablet 11   atorvastatin (LIPITOR) 80 MG tablet Take 1 tablet (80 mg total) by mouth daily. 90 tablet 3   Blood  Glucose Monitoring Suppl (ACCU-CHEK GUIDE) w/Device KIT Check blood sugar once daily before breakfast 1 kit 0   buPROPion (WELLBUTRIN XL) 150 MG 24 hr tablet Take 150 mg by mouth daily.     cetirizine (ZYRTEC ALLERGY) 10 MG tablet Take 1 tablet (10 mg total) by mouth 2 (two) times daily. 60 tablet 5   dapagliflozin propanediol (FARXIGA) 10 MG TABS tablet Take 1 tablet (10 mg total) by mouth daily. 30 tablet 9   diphenoxylate-atropine (LOMOTIL) 2.5-0.025 MG tablet Take 2 tablets by mouth 4 (four) times daily as needed for diarrhea/loose stools. 60 tablet 3   famotidine (PEPCID) 20 MG tablet Take 1 tablet (20 mg total) by mouth 2 (two) times daily. 60 tablet 5   feeding supplement (ENSURE ENLIVE / ENSURE PLUS) LIQD Take 237 mLs by mouth 2 (two) times daily between meals. 237 mL 12   Fezolinetant (VEOZAH) 45 MG TABS Take 1 tablet (45 mg  total) by mouth daily. 30 tablet 2   furosemide (LASIX) 20 MG tablet Take 4 tablets (80 mg total) by mouth 2 (two) times daily. 240 tablet 3   glucose blood (ACCU-CHEK GUIDE) test strip Use as instructed to check blood sugar daily before breakfast 100 each 12   ivabradine (CORLANOR) 5 MG TABS tablet Take 1/2 tablet (2.5 mg total) by mouth 2 (two) times daily. 30 tablet 5   lipase/protease/amylase (CREON) 36000 UNITS CPEP capsule Take 2 capsules (72,000 Units total) by mouth with meals AND 1 capsule (36,000 Units total) with snacks. 300 capsule 5   metolazone (ZAROXOLYN) 2.5 MG tablet Take 1 tablet (2.5 mg total) by mouth 2 (two) times a week. 8 tablet 0   metroNIDAZOLE (METROGEL) 1 % gel Apply 1 Application topically as needed (rash).     Multiple Vitamins-Minerals (MULTIVITAMIN WITH MINERALS) tablet Take 1 tablet by mouth daily.     oxymetazoline (AFRIN) 0.05 % nasal spray Place 1-2 sprays into both nostrils 2 (two) times daily as needed for congestion.     potassium chloride SA (KLOR-CON M) 20 MEQ tablet Take 6 tablets (120 mEq total) by mouth every morning AND 5  tablets (100 mEq total) daily at 12 noon AND 6 tablets (120 mEq total) every evening. 510 tablet 3   prazosin (MINIPRESS) 2 MG capsule Take 2-6 mg by mouth daily as needed (for sleep).     promethazine (PHENERGAN) 25 MG tablet Take 1 tablet (25 mg total) by mouth every 6 (six) to 8 (eight) hours for nausea or vomiting. (Patient taking differently: Take 25 mg by mouth every 6 (six) hours as needed for nausea or vomiting.) 90 tablet 2   sitaGLIPtin (JANUVIA) 50 MG tablet Take 1 tablet (50 mg total) by mouth daily. 90 tablet 0   spironolactone (ALDACTONE) 25 MG tablet Take 1 tablet (25 mg total) by mouth daily. 90 tablet 3   triamcinolone cream (KENALOG) 0.1 % Apply 1 Application (1 gram) topically 2 (two) times daily. 80 g 1   vedolizumab (ENTYVIO) 300 MG injection Inject 300 mg into the vein every 2 (two) months.     zolpidem (AMBIEN) 10 MG tablet Take 5-10 mg by mouth at bedtime.     No current facility-administered medications on file prior to visit.    Allergies  Allergen Reactions   Gabapentin Diarrhea   Sulfa Antibiotics Hives, Itching and Rash   Erythromycin Hives   Tramadol Rash and Other (See Comments)    Urinary retention     Ibuprofen Itching   Tape Rash    Social History   Socioeconomic History   Marital status: Single    Spouse name: Not on file   Number of children: 0   Years of education: 16   Highest education level: Bachelor's degree (e.g., BA, AB, BS)  Occupational History   Not on file  Tobacco Use   Smoking status: Former    Current packs/day: 0.00    Types: Cigarettes    Quit date: 01/15/2021    Years since quitting: 2.3    Passive exposure: Past   Smokeless tobacco: Never  Vaping Use   Vaping status: Never Used  Substance and Sexual Activity   Alcohol use: Not Currently   Drug use: Never   Sexual activity: Not on file  Other Topics Concern   Not on file  Social History Narrative   Not on file   Social Drivers of Health   Financial Resource  Strain: Medium Risk (05/16/2023)  Overall Financial Resource Strain (CARDIA)    Difficulty of Paying Living Expenses: Somewhat hard  Food Insecurity: No Food Insecurity (05/16/2023)   Hunger Vital Sign    Worried About Running Out of Food in the Last Year: Never true    Ran Out of Food in the Last Year: Never true  Transportation Needs: No Transportation Needs (05/16/2023)   PRAPARE - Administrator, Civil Service (Medical): No    Lack of Transportation (Non-Medical): No  Physical Activity: Insufficiently Active (05/16/2023)   Exercise Vital Sign    Days of Exercise per Week: 4 days    Minutes of Exercise per Session: 20 min  Stress: Stress Concern Present (05/16/2023)   [employer] of Occupational Health - Occupational Stress Questionnaire    Feeling of Stress : Very much  Social Connections: Moderately Isolated (05/16/2023)   Social Connection and Isolation Panel [NHANES]    Frequency of Communication with Friends and Family: More than three times a week    Frequency of Social Gatherings with Friends and Family: More than three times a week    Attends Religious Services: 1 to 4 times per year    Active Member of [employer] or Organizations: No    Attends Engineer, structural: Not on file    Marital Status: Never married  Intimate Partner Violence: Unknown (07/21/2021)   Received from [employer], [facility]   HITS    Physically Hurt: Not on file    Insult or Talk Down To: Not on file    Threaten Physical Harm: Not on file    Scream or Curse: Not on file    Family History  Problem Relation Age of Onset   Eczema Mother    Stroke Mother    Atrial fibrillation Mother    Heart failure Father    Asthma Brother    Stomach cancer Maternal Grandfather    Liver cancer Neg Hx    Esophageal cancer Neg Hx    Colon polyps Neg Hx    Colon cancer Neg Hx    Allergic rhinitis Neg Hx    Angioedema Neg Hx    Urticaria Neg Hx     Past Surgical History:  Procedure  Laterality Date   ABDOMINAL HYSTERECTOMY     BIOPSY  02/26/2022   Procedure: BIOPSY;  Surgeon: Shellia Cleverly, DO;  Location: [facility];  Service: Gastroenterology;;   CARDIAC CATHETERIZATION     CHOLECYSTECTOMY     COLONOSCOPY WITH PROPOFOL N/A 02/26/2022   Procedure: COLONOSCOPY WITH PROPOFOL;  Surgeon: Shellia Cleverly, DO;  Location: [facility];  Service: Gastroenterology;  Laterality: N/A;   ESOPHAGOGASTRODUODENOSCOPY (EGD) WITH PROPOFOL N/A 02/26/2022   Procedure: ESOPHAGOGASTRODUODENOSCOPY (EGD) WITH PROPOFOL;  Surgeon: Shellia Cleverly, DO;  Location: [facility];  Service: Gastroenterology;  Laterality: N/A;   ICD IMPLANT N/A 07/21/2021   Procedure: ICD IMPLANT;  Surgeon: Duke Salvia, MD;  [facility]: [facility] INVASIVE CV LAB;  Service: Cardiovascular;  Laterality: N/A;   NECK SURGERY     OVARIAN CYST SURGERY     POLYPECTOMY  02/26/2022   Procedure: POLYPECTOMY;  Surgeon: Shellia Cleverly, DO;  Location: [facility];  Service: Gastroenterology;;   RIGHT/LEFT HEART CATH AND CORONARY ANGIOGRAPHY N/A 01/23/2021   Procedure: RIGHT/LEFT HEART CATH AND CORONARY ANGIOGRAPHY;  Surgeon: Dolores Patty, MD;  Location: [facility] INVASIVE CV LAB;  Service: Cardiovascular;  Laterality: N/A;   TONSILLECTOMY      ROS: Review of Systems Negative except as stated  above  PHYSICAL EXAM: BP (!) 91/59 (BP Location: Left Arm, Patient Position: Sitting, Cuff Size: Normal)   Pulse 81   Temp 98.2 F (36.8 C) (Oral)   Ht 5\' 10"  (1.778 m)   Wt 183 lb (83 kg)   SpO2 96%   BMI 26.26 kg/m   Physical Exam  General appearance - alert, well appearing, middle-age Caucasian female and in no distress Mental status - normal mood, behavior, speech, dress, motor activity, and thought processes Neck - supple, no significant adenopathy Chest - clear to auscultation, no wheezes, rales or rhonchi, symmetric air entry Heart - normal rate, regular rhythm, normal S1, S2, no murmurs, rubs, clicks or  gallops Extremities - peripheral pulses normal, no pedal edema, no clubbing or cyanosis      Latest Ref Rng & Units 05/10/2023    1:41 PM 04/23/2023    1:39 PM 04/22/2023    1:56 PM  CMP  Glucose 70 - 99 mg/dL 161  91    BUN 6 - 20 mg/dL 31  19    Creatinine 0.96 - 1.00 mg/dL 0.45  4.09    Sodium 811 - 145 mmol/L 134  136    Potassium 3.5 - 5.1 mmol/L 3.3  4.1    Chloride 98 - 111 mmol/L 89  93    CO2 22 - 32 mmol/L 32  33    Calcium 8.9 - 10.3 mg/dL 9.4  9.8    Total Protein 6.0 - 8.5 g/dL   7.6   Total Bilirubin 0.0 - 1.2 mg/dL   0.5   Alkaline Phos 44 - 121 IU/L   145   AST 0 - 40 IU/L   26   ALT 0 - 32 IU/L   21    Lipid Panel     Component Value Date/Time   CHOL 185 03/01/2022 0339   TRIG 221 (H) 03/01/2022 0339   HDL 36 (L) 03/01/2022 0339   CHOLHDL 5.1 03/01/2022 0339   VLDL 44 (H) 03/01/2022 0339   LDLCALC 105 (H) 03/01/2022 0339    CBC    Component Value Date/Time   WBC 7.8 04/22/2023 1356   WBC 12.7 (H) 01/30/2023 0842   RBC 5.69 (H) 04/22/2023 1356   RBC 5.60 (H) 01/30/2023 0842   HGB 16.0 (H) 04/22/2023 1356   HCT 47.9 (H) 04/22/2023 1356   PLT 361 04/22/2023 1356   MCV 84 04/22/2023 1356   MCH 28.1 04/22/2023 1356   MCH 28.6 01/30/2023 0842   MCHC 33.4 04/22/2023 1356   MCHC 34.1 01/30/2023 0842   RDW 12.5 04/22/2023 1356   LYMPHSABS 1.6 04/22/2023 1356   MONOABS 1.1 (H) 12/15/2022 1402   EOSABS 0.5 (H) 04/22/2023 1356   BASOSABS 0.1 04/22/2023 1356    ASSESSMENT AND PLAN: 1. Type 2 diabetes mellitus with other specified complication, without long-term current use of insulin (HCC) (Primary) At goal.  Continue Januvia 50 mg daily and Farxiga 10 mg daily.  Continue healthy eating habits.  If kidney function declines further, we may have to decrease the Januvia to 25 mg. - POCT glycosylated hemoglobin (Hb A1C) - POCT glucose (manual entry) - Microalbumin / creatinine urine ratio  2. Diabetes mellitus treated with oral medication (HCC) See #1  above  3. Paroxysmal tachycardia, unspecified (HCC) Stable on metoprolol - metoprolol succinate (TOPROL-XL) 25 MG 24 hr tablet; Take 1/2 tablet (12.5 mg total) by mouth daily.  Dispense: 45 tablet; Refill: 1  4. Hypokalemia Will see cardiology later this week  and have chemistry repeated.  Continue potassium supplement as prescribed.  5. Stage 3b chronic kidney disease (HCC) GFR fluctuates depending on the amount of diuretics that she takes.  We will keep an eye on this.  6. OSA on CPAP Patient reports waking up in the mornings feeling not rested.  Advised that we will we may need to refer her for CPAP titration to make sure that pressure that she is on is adequate.  Patient states she will reach out to Dr. Mayford Knife first since she was the one who diagnosed her with sleep apnea  7. Chronic systolic CHF (congestive heart failure) (HCC) Stable.  Continue Farxiga, furosemide, metoprolol, Corlanor and spironolactone  8. Coronary artery disease involving native coronary artery of native heart without angina pectoris Stable.  Continue metoprolol, atorvastatin 80 mg daily - metoprolol succinate (TOPROL-XL) 25 MG 24 hr tablet; Take 1/2 tablet (12.5 mg total) by mouth daily.  Dispense: 45 tablet; Refill: 1  9. History of herpes genitalis Discussed the importance of refraining from sexual activity during active outbreaks.  Discussed that Valtrex can be taken daily or only when she has outbreaks.  Since outbreaks have been few and far between, patient opted to take only when she has active outbreak. - valACYclovir (VALTREX) 500 MG tablet; Take 1 tablet (500 mg total) by mouth 2 (two) times daily.  Dispense: 6 tablet; Refill: 2   Patient was given the opportunity to ask questions.  Patient verbalized understanding of the plan and was able to repeat key elements of the plan.   This documentation was completed using Paediatric nurse.  Any transcriptional errors are  unintentional.  Orders Placed This Encounter  Procedures   Microalbumin / creatinine urine ratio   POCT glycosylated hemoglobin (Hb A1C)   POCT glucose (manual entry)     Requested Prescriptions   Signed Prescriptions Disp Refills   metoprolol succinate (TOPROL-XL) 25 MG 24 hr tablet 45 tablet 1    Sig: Take 1/2 tablet (12.5 mg total) by mouth daily.   valACYclovir (VALTREX) 500 MG tablet 6 tablet 2    Sig: Take 1 tablet (500 mg total) by mouth 2 (two) times daily.    Return in about 4 months (around 09/17/2023).  Jonah Blue, MD, FACP

## 2023-05-21 ENCOUNTER — Other Ambulatory Visit (HOSPITAL_COMMUNITY): Payer: Self-pay

## 2023-05-21 DIAGNOSIS — E119 Type 2 diabetes mellitus without complications: Secondary | ICD-10-CM | POA: Diagnosis not present

## 2023-05-21 DIAGNOSIS — H2513 Age-related nuclear cataract, bilateral: Secondary | ICD-10-CM | POA: Diagnosis not present

## 2023-05-21 LAB — HM DIABETES EYE EXAM

## 2023-05-22 ENCOUNTER — Encounter (HOSPITAL_COMMUNITY): Payer: Self-pay | Admitting: Internal Medicine

## 2023-05-22 ENCOUNTER — Telehealth: Payer: Self-pay | Admitting: Cardiology

## 2023-05-22 ENCOUNTER — Ambulatory Visit (HOSPITAL_COMMUNITY)
Admission: RE | Admit: 2023-05-22 | Discharge: 2023-05-22 | Disposition: A | Discharge status: Home / Self Care | Payer: Medicaid Other | Source: Ambulatory Visit | Attending: Internal Medicine | Admitting: Internal Medicine

## 2023-05-22 ENCOUNTER — Other Ambulatory Visit (HOSPITAL_COMMUNITY): Payer: Self-pay

## 2023-05-22 VITALS — BP 106/74 | HR 110 | Wt 182.4 lb

## 2023-05-22 DIAGNOSIS — Z4502 Encounter for adjustment and management of automatic implantable cardiac defibrillator: Secondary | ICD-10-CM | POA: Insufficient documentation

## 2023-05-22 DIAGNOSIS — I5022 Chronic systolic (congestive) heart failure: Secondary | ICD-10-CM | POA: Insufficient documentation

## 2023-05-22 DIAGNOSIS — E876 Hypokalemia: Secondary | ICD-10-CM | POA: Insufficient documentation

## 2023-05-22 DIAGNOSIS — I513 Intracardiac thrombosis, not elsewhere classified: Secondary | ICD-10-CM | POA: Insufficient documentation

## 2023-05-22 DIAGNOSIS — F419 Anxiety disorder, unspecified: Secondary | ICD-10-CM | POA: Insufficient documentation

## 2023-05-22 DIAGNOSIS — Z8616 Personal history of COVID-19: Secondary | ICD-10-CM | POA: Insufficient documentation

## 2023-05-22 DIAGNOSIS — F32A Depression, unspecified: Secondary | ICD-10-CM | POA: Diagnosis not present

## 2023-05-22 DIAGNOSIS — I2510 Atherosclerotic heart disease of native coronary artery without angina pectoris: Secondary | ICD-10-CM | POA: Diagnosis not present

## 2023-05-22 DIAGNOSIS — K5090 Crohn's disease, unspecified, without complications: Secondary | ICD-10-CM | POA: Insufficient documentation

## 2023-05-22 DIAGNOSIS — Z87891 Personal history of nicotine dependence: Secondary | ICD-10-CM | POA: Insufficient documentation

## 2023-05-22 DIAGNOSIS — Z5986 Financial insecurity: Secondary | ICD-10-CM | POA: Insufficient documentation

## 2023-05-22 DIAGNOSIS — I714 Abdominal aortic aneurysm, without rupture, unspecified: Secondary | ICD-10-CM | POA: Insufficient documentation

## 2023-05-22 DIAGNOSIS — G4733 Obstructive sleep apnea (adult) (pediatric): Secondary | ICD-10-CM | POA: Insufficient documentation

## 2023-05-22 DIAGNOSIS — Z7901 Long term (current) use of anticoagulants: Secondary | ICD-10-CM | POA: Diagnosis not present

## 2023-05-22 DIAGNOSIS — D751 Secondary polycythemia: Secondary | ICD-10-CM | POA: Diagnosis not present

## 2023-05-22 DIAGNOSIS — Z79899 Other long term (current) drug therapy: Secondary | ICD-10-CM | POA: Diagnosis not present

## 2023-05-22 DIAGNOSIS — R002 Palpitations: Secondary | ICD-10-CM | POA: Diagnosis not present

## 2023-05-22 DIAGNOSIS — Z8249 Family history of ischemic heart disease and other diseases of the circulatory system: Secondary | ICD-10-CM | POA: Diagnosis not present

## 2023-05-22 LAB — BASIC METABOLIC PANEL
Anion gap: 14 (ref 5–15)
BUN: 17 mg/dL (ref 6–20)
CO2: 30 mmol/L (ref 22–32)
Calcium: 10.3 mg/dL (ref 8.9–10.3)
Chloride: 93 mmol/L — ABNORMAL LOW (ref 98–111)
Creatinine, Ser: 1.31 mg/dL — ABNORMAL HIGH (ref 0.44–1.00)
GFR, Estimated: 48 mL/min — ABNORMAL LOW (ref >60)
Glucose, Bld: 116 mg/dL — ABNORMAL HIGH (ref 70–99)
Potassium: 3.8 mmol/L (ref 3.5–5.1)
Sodium: 137 mmol/L (ref 135–145)

## 2023-05-22 LAB — BRAIN NATRIURETIC PEPTIDE: B Natriuretic Peptide: 75.8 pg/mL (ref 0.0–100.0)

## 2023-05-22 NOTE — Progress Notes (Signed)
 ADVANCED HF CLINIC NOTE   PCP: Vicci Barnie NOVAK, MD Cardiology: Dr. Raford HF Cardiologist: Dr. Cherrie  HPI: Christine [name] is a 58 y.o.female with history of chronic tobacco use, ADHD, anxiety, depression, CAD, systolic HF   Presented to ED 89/2/77 with increased shortness of breath/tachycardia. Adderrall stopped. Echo with EF 20-25%,  LHC/RHC with single vessel CAD (occluded mLAD) elevated filling pressures and  moderately reduced CO. Digoxin  added. Beta blocker stopped.  Discharged to home 01/25/21. Discharge weight 213 pounds.   Echo 05/11/21 EF < 20% LV severely dilated RV ok Mild MR. Referred for ICD.  CPX with very mild HF limitation with elevated Ve/VCO2 slope  S/p [place] [employer] ICD 4/23.  Multiple ED visits for various issues.   Monitor in 12/23: Rare PVCs 2 episodes of brief SVT (4 beats)   Echo (4/24) showed improved EF 35-40%, garde 1 DD, RV ok  Hospitalized 8/24 for hypokalemia. K 2.3. Repleted. Discharged next day.   Covid + 12/15/22. Completed Molnupiravir .   Today she returns for HF follow up. Says she is doing OK. Says SBP stable in 90-100 range but feels more fatigue. Breathing is up and down Says she takes metolazone  2x/week and that helps a lot. Crohn's under good control. Drinks 2 one liter bottles of water per day.takes 340meq of potassium per day. Saw Nephrology who increased spiro but she got dizzy and had to cut back.    Cardiac Testing  - Echo (4/24): EF 35-40%, garde 1 DD, RV ok - Echo (05/11/21): EF < 20% LV severely dilated RV ok Mild MR  - Echo (10/22): EF 20-25% LV severely dilated, LV apical mural thrombus, RV okay, moderate MR. - R/LHC (10/22): w/ severe 1v CAD with occlusion of mid LAD., PCW 27, CO 4.7, CI 2.2  - cMRI (10/22): demonstrated subendocardial LGE consistent with prior infarcts in LV basal inferolateral wall, apical anterior/septal/inferior walls and apex. LVEF 22%. - CPX 3/23: FVC 3.60 (85%)      FEV1 2.89 (87%)         FEV1/FVC 80 (100%)        MVV 171 (157%)   Resting HR: 113 Standing HR: 113 Peak HR: 149   (89 % age predicted max HR)  BP rest: 98/60 Standing BP: 90/62 BP peak: 134/58  Peak VO2: 18.8 (90% predicted peak VO2)  VE/VCO2 slope:  33 Peak RER: 1.14  VE/MVV:  38% O2pulse:  11   (100 % predicted O2pulse)   ROS: All systems negative except as listed in HPI, PMH and Problem List.  SH:  Social History   Socioeconomic History   Marital status: Single    Spouse name: Not on file   Number of children: 0   Years of education: 16   Highest education level: Bachelor's degree (e.g., BA, AB, BS)  Occupational History   Not on file  Tobacco Use   Smoking status: Former    Current packs/day: 0.00    Types: Cigarettes    Quit date: 01/15/2021    Years since quitting: 2.3    Passive exposure: Past   Smokeless tobacco: Never  Vaping Use   Vaping status: Never Used  Substance and Sexual Activity   Alcohol use: Not Currently   Drug use: Never   Sexual activity: Not on file  Other Topics Concern   Not on file  Social History Narrative   Not on file   Social Drivers of Health   Financial Resource Strain: Medium Risk (05/16/2023)  Overall Financial Resource Strain (CARDIA)    Difficulty of Paying Living Expenses: Somewhat hard  Food Insecurity: No Food Insecurity (05/16/2023)   Hunger Vital Sign    Worried About Running Out of Food in the Last Year: Never true    Ran Out of Food in the Last Year: Never true  Transportation Needs: No Transportation Needs (05/16/2023)   PRAPARE - Administrator, Civil Service (Medical): No    Lack of Transportation (Non-Medical): No  Physical Activity: Insufficiently Active (05/16/2023)   Exercise Vital Sign    Days of Exercise per Week: 4 days    Minutes of Exercise per Session: 20 min  Stress: Stress Concern Present (05/16/2023)   [employer] of Occupational Health - Occupational Stress Questionnaire    Feeling of Stress : Very much   Social Connections: Moderately Isolated (05/16/2023)   Social Connection and Isolation Panel [NHANES]    Frequency of Communication with Friends and Family: More than three times a week    Frequency of Social Gatherings with Friends and Family: More than three times a week    Attends Religious Services: 1 to 4 times per year    Active Member of [employer] or Organizations: No    Attends Engineer, Structural: Not on file    Marital Status: Never married  Intimate Partner Violence: Unknown (07/21/2021)   Received from [employer], [facility]   HITS    Physically Hurt: Not on file    Insult or Talk Down To: Not on file    Threaten Physical Harm: Not on file    Scream or Curse: Not on file   FH:  Family History  Problem Relation Age of Onset   Eczema Mother    Stroke Mother    Atrial fibrillation Mother    Heart failure Father    Asthma Brother    Stomach cancer Maternal Grandfather    Liver cancer Neg Hx    Esophageal cancer Neg Hx    Colon polyps Neg Hx    Colon cancer Neg Hx    Allergic rhinitis Neg Hx    Angioedema Neg Hx    Urticaria Neg Hx    Past Medical History:  Diagnosis Date   Allergy     Anxiety 01/20/2021   CAD in native artery 08/24/2021   CHF (congestive heart failure) (HCC)    Chronic combined systolic and diastolic heart failure (HCC) 08/24/2021   Complication of anesthesia    Crohn's colitis (HCC)    Depression    Hyperlipidemia    Myocardial infarction (HCC)    Presence of cardiac defibrillator 07/2021   Shortness of breath 01/20/2021   Sleep apnea 07/2021   Tobacco abuse 01/20/2021   Urticaria    Current Outpatient Medications  Medication Sig Dispense Refill   Accu-Chek Softclix Lancets lancets Use as instructed.  Check blood sugar daily before breakfast. 100 each 12   acetaminophen  (TYLENOL ) 500 MG tablet Take 1,000 mg by mouth 3 (three) times daily as needed for moderate pain or headache.     ALPRAZolam  (XANAX ) 1 MG tablet Take 2 mg by  mouth 3 (three) times daily.     alum & mag hydroxide-simeth (MAALOX/MYLANTA) 200-200-20 MG/5ML suspension Take 30 mLs by mouth every 4 (four) hours as needed for indigestion or heartburn. 355 mL 0   apixaban  (ELIQUIS ) 5 MG TABS tablet Take 1 tablet (5 mg total) by mouth 2 (two) times daily. 60 tablet 11   atorvastatin  (LIPITOR ) 80 MG  tablet Take 1 tablet (80 mg total) by mouth daily. 90 tablet 3   Blood Glucose Monitoring Suppl (ACCU-CHEK GUIDE) w/Device KIT Check blood sugar once daily before breakfast 1 kit 0   buPROPion  (WELLBUTRIN  XL) 150 MG 24 hr tablet Take 150 mg by mouth daily.     cetirizine  (ZYRTEC  ALLERGY ) 10 MG tablet Take 1 tablet (10 mg total) by mouth 2 (two) times daily. 60 tablet 5   cyclobenzaprine  (FLEXERIL ) 10 MG tablet Take 10 mg by mouth as needed for muscle spasms.     dapagliflozin  propanediol (FARXIGA ) 10 MG TABS tablet Take 1 tablet (10 mg total) by mouth daily. 30 tablet 9   diphenoxylate -atropine  (LOMOTIL ) 2.5-0.025 MG tablet Take 2 tablets by mouth 4 (four) times daily as needed for diarrhea/loose stools. 60 tablet 3   famotidine  (PEPCID ) 20 MG tablet Take 1 tablet (20 mg total) by mouth 2 (two) times daily. 60 tablet 5   feeding supplement (ENSURE ENLIVE / ENSURE PLUS) LIQD Take 237 mLs by mouth 2 (two) times daily between meals. 237 mL 12   Fezolinetant  (VEOZAH ) 45 MG TABS Take 1 tablet (45 mg total) by mouth daily. 30 tablet 2   furosemide  (LASIX ) 20 MG tablet Take 4 tablets (80 mg total) by mouth 2 (two) times daily. 240 tablet 3   glucose blood (ACCU-CHEK GUIDE) test strip Use as instructed to check blood sugar daily before breakfast 100 each 12   ivabradine  (CORLANOR ) 5 MG TABS tablet Take 1/2 tablet (2.5 mg total) by mouth 2 (two) times daily. 30 tablet 5   metolazone  (ZAROXOLYN ) 2.5 MG tablet Take 1 tablet (2.5 mg total) by mouth 2 (two) times a week. 8 tablet 0   metoprolol  succinate (TOPROL -XL) 25 MG 24 hr tablet Take 1/2 tablet (12.5 mg total) by mouth daily.  45 tablet 1   metroNIDAZOLE  (METROGEL ) 1 % gel Apply 1 Application topically as needed (rash).     Multiple Vitamins-Minerals (MULTIVITAMIN WITH MINERALS) tablet Take 1 tablet by mouth daily.     oxymetazoline (AFRIN) 0.05 % nasal spray Place 1-2 sprays into both nostrils 2 (two) times daily as needed for congestion.     Pancrelipase , Lip-Prot-Amyl, (CREON  PO) Take 3,600 Units by mouth in the morning, at noon, and at bedtime.     potassium chloride  SA (KLOR-CON  M) 20 MEQ tablet Take 6 tablets (120 mEq total) by mouth every morning AND 5 tablets (100 mEq total) daily at 12 noon AND 6 tablets (120 mEq total) every evening. 510 tablet 3   prazosin (MINIPRESS) 2 MG capsule Take 2-6 mg by mouth daily as needed (for sleep).     promethazine  (PHENERGAN ) 25 MG tablet Take 1 tablet (25 mg total) by mouth every 6 (six) to 8 (eight) hours for nausea or vomiting. 90 tablet 2   sitaGLIPtin  (JANUVIA ) 50 MG tablet Take 1 tablet (50 mg total) by mouth daily. 90 tablet 0   spironolactone  (ALDACTONE ) 25 MG tablet Take 1 tablet (25 mg total) by mouth daily. 90 tablet 3   triamcinolone  cream (KENALOG ) 0.1 % Apply 1 Application topically as needed.     valACYclovir  (VALTREX ) 500 MG tablet Take 1 tablet (500 mg total) by mouth 2 (two) times daily. 6 tablet 2   vedolizumab  (ENTYVIO ) 300 MG injection Inject 300 mg into the vein every 2 (two) months.     zolpidem  (AMBIEN ) 10 MG tablet Take 5-10 mg by mouth at bedtime.     No current facility-administered medications for this encounter.  BP 106/74   Pulse (!) 110   Wt 82.7 kg (182 lb 6.4 oz)   SpO2 97%   BMI 26.17 kg/m   Wt Readings from Last 3 Encounters:  05/22/23 82.7 kg (182 lb 6.4 oz)  05/20/23 83 kg (183 lb)  05/10/23 82.1 kg (181 lb)   PHYSICAL EXAM: General:  Well appearing. No resp difficulty HEENT: normal Neck: supple. no JVD. Carotids 2+ bilat; no bruits. No lymphadenopathy or thryomegaly appreciated. Cor: PMI nondisplaced. Regular rachy. No rubs,  gallops or murmurs. Lungs: clear Abdomen: soft, nontender, nondistended. No hepatosplenomegaly. No bruits or masses. Good bowel sounds. Extremities: no cyanosis, clubbing, rash, edema Neuro: alert & orientedx3, cranial nerves grossly intact. moves all 4 extremities w/o difficulty. Affect pleasant  ECG (personally reviewed): Sinus tachy 106  ICD interrogation (personally reviewed): HL Score 0 Fluid level very dry Activity level 1.0 No VT/AF Personally reviewed   ASSESSMENT & PLAN:  1. 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 Echo EF 20-25%, severely dilated LV, RV okay, moderate MR - R/LHC (10/22): 1v CAD occluded mid LAD PCWP 27,  Fick 4.7/2.  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 No viability. RV okay. Not sure how to explain inferior defects on cMRI  - Echo (05/11/21) EF < 20% RV ok - CPX 1/23 with very mild HF limitation:  Peak VO2: 18.8 (90% predicted peak VO2) VE/VCO2 slope: 33 Peak RER: 1.14  - s/p [employer] ICD 4/23. - Echo (4/24): EF 35-40%, RV ok - Remains NYHA III. Volume status ok. Suspect maybe a little dry in setting of frequent metolazone  use  - Continue Lasix  40 mg bid + 80 KCL tid (with Crohn's and frequent hypokalemic episodes). - Using metolazone  + K 40 2x/week and sometimes more. Asked her to be careful not to overdo this - Continue Ivabradine  2.5 mg bid.  - Continue Toprol  XL 12.5 mg daily. - Continue spiro 25 mg daily. Ideally would increase to help with hypoK, but no BP room today - Continue Farxiga  10 mg daily.  - Off losartan  due to low BP. BP too low to re-challenge -  Repeat echo - Will need repeat CPX study in near future to assess need for advanced therapies   2. Palpitations - No longer on Adderall. - Zio 14-day (11/22) showed mostly SR, no high-grade arrhythmias. - Monitor 12/23 SR. Two brief runs SVT (4 beats)  - Stable  ICD interrogation   3. CAD - Single vessel LAD occlusion on LHC. -No s/s ischemia - Continue statin. - No ASA with Eliquis .   4. Tobacco use - Remains quit from cigarettes.  5. LV apical thrombus - Noted on echo/cMRI.  - None on most recent echo 4/24. - Continue Eliquis . No bleeding  6. Crohn's  - Following with GI.   7. OSA - Mild on sleep study, AHI 11.8 - Follows with Dr. Shlomo, referred to sleep dentistry to discuss oral device.  8. Polycythemia - Follows with Heme/Onc. - JAK negative  9. AAA - CT scan 11/23  3.1cm - Follow u/s in 3 years recommended (02/2025)  10. Hypokalemia - Chronic, Crohn's likely contributing. - Recheck today  Toribio Fuel, MD  2:23 PM

## 2023-05-22 NOTE — Telephone Encounter (Signed)
 Pt states that she has been having a lot of fatigue since December. She states that she feels like she is not getting enough air through CPAP. Pt would like a c/b regarding this matter. Please advise

## 2023-05-22 NOTE — Patient Instructions (Signed)
 Medication Changes:  No Changes In Medications at this time.   Lab Work:  Labs done today, your results will be available in MyChart, we will contact you for abnormal readings.  Testing/Procedures:  Your physician has requested that you have an echocardiogram. Echocardiography is a painless test that uses sound waves to create images of your heart. It provides your doctor with information about the size and shape of your heart and how well your heart's chambers and valves are working. This procedure takes approximately one hour. There are no restrictions for this procedure. Please do NOT wear cologne, perfume, aftershave, or lotions (deodorant is allowed). Please arrive 15 minutes prior to your appointment time.  Please note: We ask at that you not bring children with you during ultrasound (echo/ vascular) testing. Due to room size and safety concerns, children are not allowed in the ultrasound rooms during exams. Our front office staff cannot provide observation of children in our lobby area while testing is being conducted. An adult accompanying a patient to their appointment will only be allowed in the ultrasound room at the discretion of the ultrasound technician under special circumstances. We apologize for any inconvenience.  Follow-Up in: 6 months PLEASE CALL OUR OFFICE AROUND JUNE 2025 TO GET SCHEDULED FOR YOUR APPOINTMENT. PHONE NUMBER IS [phone] OPTION 2    At the [facility], you and your health needs are our priority. We have a designated team specialized in the treatment of Heart Failure. This Care Team includes your primary Heart Failure Specialized Cardiologist (physician), Advanced Practice Providers (APPs- Physician Assistants and Nurse Practitioners), and Pharmacist who all work together to provide you with the care you need, when you need it.   You may see any of the following providers on your designated Care Team at your next follow up:  Dr. Toribio Fuel Dr. Ezra Shuck Dr. Ria Commander Dr. Odis Brownie Greig Mosses, NP Caffie Shed, [place] [facility] [place], [place] Beckey Coe, NP Jordan Lee, NP Tinnie Redman, PharmD   Please be sure to bring in all your medications bottles to every appointment.   Need to Contact Us :  If you have any questions or concerns before your next appointment please send us  a message through [place] or call our office at [phone].    TO LEAVE A MESSAGE FOR THE NURSE SELECT OPTION 2, PLEASE LEAVE A MESSAGE INCLUDING: YOUR NAME DATE OF BIRTH CALL BACK NUMBER REASON FOR CALL**this is important as we prioritize the call backs  YOU WILL RECEIVE A CALL BACK THE SAME DAY AS LONG AS YOU CALL BEFORE 4:00 PM

## 2023-05-23 NOTE — Addendum Note (Signed)
 Encounter addended by: Janya Eveland R, MD on: 05/23/2023 9:09 AM  Actions taken: Charge Capture section accepted

## 2023-05-24 ENCOUNTER — Encounter (HOSPITAL_BASED_OUTPATIENT_CLINIC_OR_DEPARTMENT_OTHER): Payer: Self-pay | Admitting: Cardiology

## 2023-05-25 ENCOUNTER — Other Ambulatory Visit: Payer: Self-pay | Admitting: Internal Medicine

## 2023-05-25 ENCOUNTER — Other Ambulatory Visit: Payer: Self-pay | Admitting: Gastroenterology

## 2023-05-25 DIAGNOSIS — E119 Type 2 diabetes mellitus without complications: Secondary | ICD-10-CM

## 2023-05-27 ENCOUNTER — Other Ambulatory Visit: Payer: Self-pay

## 2023-05-27 ENCOUNTER — Other Ambulatory Visit (HOSPITAL_COMMUNITY): Payer: Self-pay

## 2023-05-27 MED ORDER — SITAGLIPTIN PHOSPHATE 50 MG PO TABS
50.0000 mg | ORAL_TABLET | Freq: Every day | ORAL | 1 refills | Status: DC
  Filled 2023-05-27 – 2023-08-16 (×3): qty 90, 90d supply, fill #0

## 2023-05-27 MED ORDER — PROMETHAZINE HCL 25 MG PO TABS
25.0000 mg | ORAL_TABLET | Freq: Four times a day (QID) | ORAL | 3 refills | Status: DC
  Filled 2023-05-27: qty 90, 23d supply, fill #0
  Filled 2023-06-08 – 2023-06-10 (×3): qty 90, 23d supply, fill #1
  Filled 2023-07-12: qty 90, 23d supply, fill #2
  Filled 2023-08-02: qty 90, 23d supply, fill #3

## 2023-05-27 NOTE — Telephone Encounter (Signed)
 Requested Prescriptions  Pending Prescriptions Disp Refills   sitaGLIPtin  (JANUVIA ) 50 MG tablet 90 tablet 1    Sig: Take 1 tablet (50 mg total) by mouth daily.     Endocrinology:  Diabetes - DPP-4 Inhibitors Failed - 05/27/2023 12:09 PM      Failed - Cr in normal range and within 360 days    Creatinine  Date Value Ref Range Status  12/25/2021 0.90 0.44 - 1.00 mg/dL Final   Creatinine, Ser  Date Value Ref Range Status  05/22/2023 1.31 (H) 0.44 - 1.00 mg/dL Final   Creatinine, Urine  Date Value Ref Range Status  12/08/2022 48 mg/dL Final         Passed - HBA1C is between 0 and 7.9 and within 180 days    HbA1c, POC (controlled diabetic range)  Date Value Ref Range Status  05/20/2023 6.4 0.0 - 7.0 % Final         Passed - Valid encounter within last 6 months    Recent Outpatient Visits           1 week ago Type 2 diabetes mellitus with other specified complication, without long-term current use of insulin  [facility])   [facility] [place]. [facility] Concetta Dee B, MD   4 months ago Type 2 diabetes mellitus with other specified complication, without long-term current use of insulin  [facility])   [facility] Of [place]. [facility] Lawrance Presume, MD   6 months ago Injury of great toenail   [facility] [place]. [facility] Concetta Dee B, MD   8 months ago Coronary artery disease involving native coronary artery of native heart without angina pectoris   [facility] - [facility] Dept Of [place]. [facility] Concetta Dee B, MD   1 year ago 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, NYHA class 2 [facility])   [place] [facility] - A Dept Of [place]. [facility] Lawrance Presume, MD       Future Appointments             In 3 weeks Rice, Haig Levan, MD [facility] - A Dept Of [place].  [facility]   In 4 weeks Burdette Carolin Archibald Kobus, DO [facility] [place] [facility] [place]   In 3 months Lawrance Presume, MD [facility] - A Dept Of [place]. [facility]

## 2023-05-27 NOTE — Progress Notes (Signed)
 Remote ICD transmission.

## 2023-05-28 ENCOUNTER — Other Ambulatory Visit: Payer: Self-pay

## 2023-05-28 ENCOUNTER — Ambulatory Visit (HOSPITAL_COMMUNITY)
Admission: EM | Admit: 2023-05-28 | Discharge: 2023-05-28 | Disposition: A | Discharge status: Home / Self Care | Payer: Medicaid Other | Attending: Emergency Medicine | Admitting: Emergency Medicine

## 2023-05-28 ENCOUNTER — Encounter (HOSPITAL_COMMUNITY): Payer: Self-pay

## 2023-05-28 ENCOUNTER — Ambulatory Visit (INDEPENDENT_AMBULATORY_CARE_PROVIDER_SITE_OTHER): Payer: Medicaid Other

## 2023-05-28 DIAGNOSIS — Z95810 Presence of automatic (implantable) cardiac defibrillator: Secondary | ICD-10-CM | POA: Diagnosis not present

## 2023-05-28 DIAGNOSIS — R5383 Other fatigue: Secondary | ICD-10-CM

## 2023-05-28 DIAGNOSIS — R0602 Shortness of breath: Secondary | ICD-10-CM

## 2023-05-28 DIAGNOSIS — R079 Chest pain, unspecified: Secondary | ICD-10-CM | POA: Diagnosis not present

## 2023-05-28 NOTE — ED Triage Notes (Addendum)
Pt states she has intermittent left sided pain under her armpit/breast pain since this morning. Pt states she had left hand numbness this morning for a few minutes and nausea for a few hours.   Pt c/o of intermittent shortness of breath upon exertion for the past week that increased last night. Pt states "she does not feel like she is getting enough air in."   Pt has had extreme fatigue for 1 month ans she saw last week.   Home Interventions: Anti-Anxiety Meds (Last dose: Zanax @0800 )

## 2023-05-28 NOTE — ED Provider Notes (Signed)
[facility]    CSN: [id number] Arrival date & time: 05/28/23  1424      History   Chief Complaint Chief Complaint  Patient presents with   Shortness of Breath   Chest Pain    HPI Christine [name] is a 58 y.o. female.   Patient presents with intermittent shortness of breath upon exertion over the past week that became more constant last night.  Patient endorses intermittent shooting chest pain to left side of chest that began this morning.   Patient also endorses an episode of left hand numbness that lasted about 5 minutes and mild nausea that began this morning but has since subsided.    Patient reports increased fatigue x 1 month and was seen by cardiology within the last week when they recommended following up with her sleep medicine doctor regarding her CPAP settings.  History of CHF, MI, and currently has a cardiac defibrillator.  Reports taking all medications as prescribed.   Shortness of Breath Associated symptoms: chest pain   Associated symptoms: no abdominal pain, no cough, no fever and no wheezing   Chest Pain Associated symptoms: fatigue, numbness and shortness of breath   Associated symptoms: no abdominal pain, no cough, no dizziness, no fever and no weakness     Past Medical History:  Diagnosis Date   Allergy    Anxiety 01/20/2021   CAD in native artery 08/24/2021   CHF (congestive heart failure) (HCC)    Chronic combined systolic and diastolic heart failure (HCC) 08/24/2021   Complication of anesthesia    Crohn's colitis (HCC)    Depression    Hyperlipidemia    Myocardial infarction [facility])    Presence of cardiac defibrillator 07/2021   Shortness of breath 01/20/2021   Sleep apnea 07/2021   Tobacco abuse 01/20/2021   Urticaria     Patient Active Problem List   Diagnosis Date Noted   Crohn's disease involving terminal ileum (HCC) 07/23/2022   Crohn's disease of large intestine with other complication (HCC) 05/11/2022   Mild major  depression (HCC) 05/11/2022   Paroxysmal tachycardia, unspecified (HCC) 05/11/2022   Essential hypertension 03/02/2022   Acute pancreatitis 03/01/2022   Hypokalemia 03/01/2022   Ileitis 03/01/2022   Diarrhea 02/26/2022   Nausea without vomiting 02/26/2022   Loss of weight 02/26/2022   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   Ulcer of ileum 02/26/2022   Diverticulosis of colon without hemorrhage 02/26/2022   Cardiomyopathy (HCC) ischemic and non-ischemic 01/16/2022   Abdominal aortic aneurysm (AAA) 3.0 cm to 5.0 cm in diameter in female (HCC) 01/09/2022   Polycythemia 01/09/2022   Chronic combined systolic and diastolic heart failure (HCC) 08/24/2021   CAD in native artery 08/24/2021   S/P ICD (internal cardiac defibrillator) procedure 08/10/2021   Obstructive sleep apnea 05/05/2021   Former smoker 02/06/2021   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   Obesity (BMI 30.0-34.9) 02/06/2021   Anxiety 01/20/2021   Tobacco abuse 01/20/2021   Shortness of breath 01/20/2021   Chest tightness     Past Surgical History:  Procedure Laterality Date   ABDOMINAL HYSTERECTOMY     BIOPSY  02/26/2022   Procedure: BIOPSY;  Surgeon: Shellia Cleverly, DO;  Location: [facility];  Service: Gastroenterology;;   CARDIAC CATHETERIZATION     CHOLECYSTECTOMY     COLONOSCOPY WITH PROPOFOL N/A 02/26/2022   Procedure: COLONOSCOPY WITH PROPOFOL;  Surgeon: Shellia Cleverly, DO;  Location: [facility];  Service: Gastroenterology;  Laterality: N/A;   ESOPHAGOGASTRODUODENOSCOPY (EGD) WITH PROPOFOL  N/A 02/26/2022   Procedure: ESOPHAGOGASTRODUODENOSCOPY (EGD) WITH PROPOFOL;  Surgeon: Shellia Cleverly, DO;  Location: [facility];  Service: Gastroenterology;  Laterality: N/A;   ICD IMPLANT N/A 07/21/2021   Procedure: ICD IMPLANT;  Surgeon: Duke Salvia, MD;  [facility] CV LAB;  Service: Cardiovascular;  Laterality: N/A;   NECK SURGERY     OVARIAN CYST SURGERY     POLYPECTOMY  02/26/2022    Procedure: POLYPECTOMY;  Surgeon: Shellia Cleverly, DO;  Location: [facility];  Service: Gastroenterology;;   RIGHT/LEFT HEART CATH AND CORONARY ANGIOGRAPHY N/A 01/23/2021   Procedure: RIGHT/LEFT HEART CATH AND CORONARY ANGIOGRAPHY;  Surgeon: Dolores Patty, MD;  Location: [facility]AS[facility] CV LAB;  Service: Cardiovascular;  Laterality: N/A;   TONSILLECTOMY      OB History     Gravida  1   Para      Term      Preterm      AB  1   Living         SAB      IAB      Ectopic      Multiple      Live Births               Home Medications    Prior to Admission medications   Medication Sig Start Date End Date Taking? Authorizing Provider  Accu-Chek Softclix Lancets lancets Use as instructed.  Check blood sugar daily before breakfast. 11/02/22   Marcine Matar, MD  acetaminophen (TYLENOL) 500 MG tablet Take 1,000 mg by mouth 3 (three) times daily as needed for moderate pain or headache.    [provider]  ALPRAZolam Prudy Feeler) 1 MG tablet Take 2 mg by mouth 3 (three) times daily.    [provider]  alum & mag hydroxide-simeth (MAALOX/MYLANTA) 200-200-20 MG/5ML suspension Take 30 mLs by mouth every 4 (four) hours as needed for indigestion or heartburn. 03/02/22   Atway, Rayann N, DO  apixaban (ELIQUIS) 5 MG TABS tablet Take 1 tablet (5 mg total) by mouth 2 (two) times daily. 11/05/22   Bensimhon, Bevelyn Buckles, MD  atorvastatin (LIPITOR) 80 MG tablet Take 1 tablet (80 mg total) by mouth daily. 01/28/23   Laurey Morale, MD  Blood Glucose Monitoring Suppl (ACCU-CHEK GUIDE) w/Device KIT Check blood sugar once daily before breakfast 11/02/22   Marcine Matar, MD  buPROPion (WELLBUTRIN XL) 150 MG 24 hr tablet Take 150 mg by mouth daily.    [provider]  cetirizine (ZYRTEC ALLERGY) 10 MG tablet Take 1 tablet (10 mg total) by mouth 2 (two) times daily. 04/22/23   Ellamae Sia, DO  cyclobenzaprine (FLEXERIL) 10 MG tablet Take 10 mg by mouth as needed  for muscle spasms.    [provider]  dapagliflozin propanediol (FARXIGA) 10 MG TABS tablet Take 1 tablet (10 mg total) by mouth daily. 10/23/22   Bensimhon, Bevelyn Buckles, MD  diphenoxylate-atropine (LOMOTIL) 2.5-0.025 MG tablet Take 2 tablets by mouth 4 (four) times daily as needed for diarrhea/loose stools. 11/19/22     famotidine (PEPCID) 20 MG tablet Take 1 tablet (20 mg total) by mouth 2 (two) times daily. 04/22/23   Ellamae Sia, DO  feeding supplement (ENSURE ENLIVE / ENSURE PLUS) LIQD Take 237 mLs by mouth 2 (two) times daily between meals. 03/03/22   Atway, Rayann N, DO  Fezolinetant (VEOZAH) 45 MG TABS Take 1 tablet (45 mg total) by mouth daily. 05/08/23   Laural Benes,  Binnie Rail, MD  furosemide (LASIX) 20 MG tablet Take 4 tablets (80 mg total) by mouth 2 (two) times daily. 03/19/23   Jacklynn Ganong, FNP  glucose blood (ACCU-CHEK GUIDE) test strip Use as instructed to check blood sugar daily before breakfast 11/02/22   Marcine Matar, MD  ivabradine (CORLANOR) 5 MG TABS tablet Take 1/2 tablet (2.5 mg total) by mouth 2 (two) times daily. 12/24/22   Alen Bleacher, NP  metolazone (ZAROXOLYN) 2.5 MG tablet Take 1 tablet (2.5 mg total) by mouth 2 (two) times a week. 05/13/23   Milford, Anderson Malta, FNP  metoprolol succinate (TOPROL-XL) 25 MG 24 hr tablet Take 1/2 tablet (12.5 mg total) by mouth daily. 05/20/23   Marcine Matar, MD  metroNIDAZOLE (METROGEL) 1 % gel Apply 1 Application topically as needed (rash).    [provider]  Multiple Vitamins-Minerals (MULTIVITAMIN WITH MINERALS) tablet Take 1 tablet by mouth daily.    [provider]  oxymetazoline (AFRIN) 0.05 % nasal spray Place 1-2 sprays into both nostrils 2 (two) times daily as needed for congestion.    [provider]  Pancrelipase, Lip-Prot-Amyl, (CREON PO) Take 3,600 Units by mouth in the morning, at noon, and at bedtime.    [provider]  potassium chloride SA (KLOR-CON M) 20 MEQ tablet Take 6  tablets (120 mEq total) by mouth every morning AND 5 tablets (100 mEq total) daily at 12 noon AND 6 tablets (120 mEq total) every evening. 04/12/23   Andrey Farmer, PA-C  prazosin (MINIPRESS) 2 MG capsule Take 2-6 mg by mouth daily as needed (for sleep). 10/24/22   [provider]  promethazine (PHENERGAN) 25 MG tablet Take 1 tablet (25 mg total) by mouth every 6 (six) to 8 (eight) hours for nausea or vomiting. 05/27/23   Cirigliano, Vito V, DO  sitaGLIPtin (JANUVIA) 50 MG tablet Take 1 tablet (50 mg total) by mouth daily. 05/27/23   Marcine Matar, MD  spironolactone (ALDACTONE) 25 MG tablet Take 1 tablet (25 mg total) by mouth daily. 11/30/22   Bensimhon, Bevelyn Buckles, MD  triamcinolone cream (KENALOG) 0.1 % Apply 1 Application topically as needed.    [provider]  valACYclovir (VALTREX) 500 MG tablet Take 1 tablet (500 mg total) by mouth 2 (two) times daily. 05/20/23   Marcine Matar, MD  vedolizumab (ENTYVIO) 300 MG injection Inject 300 mg into the vein every 2 (two) months.    [provider]  zolpidem (AMBIEN) 10 MG tablet Take 5-10 mg by mouth at bedtime.    [provider]    Family History Family History  Problem Relation Age of Onset   Eczema Mother    Stroke Mother    Atrial fibrillation Mother    Heart failure Father    Asthma Brother    Stomach cancer Maternal Grandfather    Liver cancer Neg Hx    Esophageal cancer Neg Hx    Colon polyps Neg Hx    Colon cancer Neg Hx    Allergic rhinitis Neg Hx    Angioedema Neg Hx    Urticaria Neg Hx     Social History Social History   Tobacco Use   Smoking status: Former    Current packs/day: 0.00    Types: Cigarettes    Quit date: 01/15/2021    Years since quitting: 2.3    Passive exposure: Past   Smokeless tobacco: Never  Vaping Use   Vaping status: Never Used  Substance Use Topics   Alcohol use: Not Currently   Drug use: Never     Allergies   Gabapentin, Sulfa antibiotics,  Erythromycin, Tramadol, Ibuprofen, and Tape   Review of Systems Review of Systems  Constitutional:  Positive for fatigue. Negative for chills and fever.  HENT:  Negative for congestion.   Respiratory:  Positive for shortness of breath. Negative for cough, chest tightness and wheezing.   Cardiovascular:  Positive for chest pain.  Gastrointestinal:  Negative for abdominal pain.  Neurological:  Positive for numbness. Negative for dizziness, syncope, facial asymmetry, speech difficulty, weakness and light-headedness.  Psychiatric/Behavioral:  Negative for confusion.      Physical Exam Triage Vital Signs ED Triage Vitals  Encounter Vitals Group     BP 05/28/23 1437 (!) 81/68     Systolic BP Percentile --      Diastolic BP Percentile --      Pulse Rate 05/28/23 1437 91     Resp 05/28/23 1437 16     Temp 05/28/23 1437 98.6 F (37 C)     Temp Source 05/28/23 1437 Oral     SpO2 05/28/23 1437 94 %     Weight --      Height --      Head Circumference --      Peak Flow --      Pain Score 05/28/23 1432 3     Pain Loc --      Pain Education --      Exclude from Growth Chart --    No data found.  Updated Vital Signs BP 95/69 (BP Location: Right Arm)   Pulse 77   Temp 98.6 F (37 C) (Oral)   Resp 16   SpO2 97%   Visual Acuity Right Eye Distance:   Left Eye Distance:   Bilateral Distance:    Right Eye Near:   Left Eye Near:    Bilateral Near:     Physical Exam Vitals and nursing note reviewed.  Constitutional:      General: She is not in acute distress.    Appearance: She is well-developed. She is not ill-appearing.  Cardiovascular:     Rate and Rhythm: Normal rate and regular rhythm.     Heart sounds: Normal heart sounds.  Pulmonary:     Effort: Pulmonary effort is normal.     Breath sounds: Normal breath sounds.  Musculoskeletal:        General: Normal range of motion.  Skin:    General: Skin is warm and dry.  Neurological:     Mental Status: She is alert.       UC Treatments / Results  Labs (all labs ordered are listed, but only abnormal results are displayed) Labs Reviewed - No data to display  EKG   Radiology DG Chest 2 View Result Date: 05/28/2023 CLINICAL DATA:  Shortness of breath. EXAM: CHEST - 2 VIEW COMPARISON:  Chest radiograph dated 12/17/2022. FINDINGS: No focal consolidation, pleural effusion, or pneumothorax. Right upper lobe subpleural scarring. The cardiac silhouette is within normal limits. Left pectoral AICD device. No acute osseous pathology. IMPRESSION: No active cardiopulmonary disease. Electronically Signed   By: Elgie Collard M.D.   On: 05/28/2023 16:41    Procedures Procedures (including critical care time)  Medications Ordered in UC Medications - No data to display  Initial Impression / Assessment and Plan / UC Course  I have reviewed the triage vital signs and the nursing notes.  Pertinent labs & imaging results  that were available during my care of the patient were reviewed by me and considered in my medical decision making (see chart for details).     Patient presented with intermittent shortness of breath that became constant last night and intermittent shooting chest pain to the left lateral aspect of her chest that began this morning.  Endorses an episode of left hand numbness and nausea that has since subsided.  Patient also endorses increased fatigue x 1 month and was seen by cardiology within the last week with a recommended following up with sleep medicine doctor regarding her CPAP settings.  History of CHF, MI, and currently has a cardiac defibrillator.  Reports taking all her medications as prescribed.  No significant findings upon exam.  Lungs clear bilaterally on auscultation.  Normal heart rate and regular rhythm noted.  EKG was unremarkable revealing normal sinus rhythm and consistent with prior EKGs.  Patient was noted to be hypotensive, but reports history of same which was confirmed  with historical data as well.  Chest x-ray ordered to rule out underlying etiology.  Chest x-ray revealed no active cardiopulmonary disease.  Discussed following up with primary care and cardiology regarding symptoms.  Discussed strict ER precautions. Final Clinical Impressions(s) / UC Diagnoses   Final diagnoses:  Shortness of breath  Chest pain, unspecified type  Other fatigue     Discharge Instructions      As discussed, based on my interpretation of X-ray there are no acute findings contributing to your symptoms. I will call you if radiology reports shows something different. I recommend following up with primary care and your cardiologist regarding symptoms. If you develop worsening chest pain, increased shortness of breath, dizziness, or passing out please seek immediate medical treatment in the ER.     ED Prescriptions   None    PDMP not reviewed this encounter.   Wynonia Lawman A, NP 05/28/23 1725

## 2023-05-28 NOTE — Discharge Instructions (Signed)
As discussed, based on my interpretation of X-ray there are no acute findings contributing to your symptoms. I will call you if radiology reports shows something different. I recommend following up with primary care and your cardiologist regarding symptoms. If you develop worsening chest pain, increased shortness of breath, dizziness, or passing out please seek immediate medical treatment in the ER.

## 2023-05-29 ENCOUNTER — Encounter: Payer: Self-pay | Admitting: Internal Medicine

## 2023-05-29 ENCOUNTER — Telehealth (HOSPITAL_COMMUNITY): Payer: Self-pay | Admitting: Cardiology

## 2023-05-29 ENCOUNTER — Telehealth: Payer: Self-pay

## 2023-05-29 NOTE — Telephone Encounter (Signed)
Copied from CRM [id number]. Topic: General - Other >> May 29, 2023  9:12 AM Phill Myron wrote: Attn: Dr Laural Benes  Please call regarding referrals we discussed: Sleep study and Pulmonology.Marland KitchenMarland KitchenPlease advise

## 2023-05-29 NOTE — Telephone Encounter (Addendum)
Patient left VM on triage line 2/11 Reports problems with SOB, wanted to know if its ok to wait   -returned call for additional details  Patient reports she feels more SOB than normal. Reports she feels like her lungs are clear however not moving air well.  Reports weight is stable at 180-182  Denies swelling and dizziness Reports increase fatigue   Seen in urgent care 2/11-advised to follow up with PCP ?pulmonary referral  Please advise

## 2023-05-29 NOTE — Telephone Encounter (Signed)
I am reaching out to the device clinic to assess fluid index. Please instruct to take extra 40 mg of lasix today and tomorrow.  Please set up for follow up.    Shantanique Hodo NP-C  10:21 AM

## 2023-05-29 NOTE — Telephone Encounter (Signed)
Pt aware to increase lasix  F/u arranged 3/6

## 2023-05-30 ENCOUNTER — Encounter: Payer: Self-pay | Admitting: Internal Medicine

## 2023-05-31 ENCOUNTER — Other Ambulatory Visit: Payer: Self-pay | Admitting: Internal Medicine

## 2023-05-31 DIAGNOSIS — G4733 Obstructive sleep apnea (adult) (pediatric): Secondary | ICD-10-CM

## 2023-05-31 NOTE — Telephone Encounter (Signed)
Christine [name]

## 2023-06-03 ENCOUNTER — Other Ambulatory Visit (HOSPITAL_COMMUNITY): Payer: Self-pay

## 2023-06-08 ENCOUNTER — Other Ambulatory Visit (HOSPITAL_COMMUNITY): Payer: Self-pay

## 2023-06-09 ENCOUNTER — Other Ambulatory Visit: Payer: Self-pay

## 2023-06-10 ENCOUNTER — Other Ambulatory Visit (HOSPITAL_COMMUNITY): Payer: Self-pay

## 2023-06-15 DIAGNOSIS — Z419 Encounter for procedure for purposes other than remedying health state, unspecified: Secondary | ICD-10-CM | POA: Diagnosis not present

## 2023-06-18 ENCOUNTER — Other Ambulatory Visit (HOSPITAL_COMMUNITY): Payer: Self-pay | Admitting: Family Medicine

## 2023-06-18 ENCOUNTER — Other Ambulatory Visit (HOSPITAL_COMMUNITY): Payer: Self-pay

## 2023-06-18 ENCOUNTER — Ambulatory Visit (HOSPITAL_COMMUNITY)
Admission: RE | Admit: 2023-06-18 | Discharge: 2023-06-18 | Disposition: A | Discharge status: Home / Self Care | Payer: Medicaid Other | Source: Ambulatory Visit | Attending: Internal Medicine | Admitting: Internal Medicine

## 2023-06-18 DIAGNOSIS — G4730 Sleep apnea, unspecified: Secondary | ICD-10-CM | POA: Insufficient documentation

## 2023-06-18 DIAGNOSIS — I110 Hypertensive heart disease with heart failure: Secondary | ICD-10-CM | POA: Insufficient documentation

## 2023-06-18 DIAGNOSIS — I77810 Thoracic aortic ectasia: Secondary | ICD-10-CM | POA: Insufficient documentation

## 2023-06-18 DIAGNOSIS — I5022 Chronic systolic (congestive) heart failure: Secondary | ICD-10-CM | POA: Diagnosis not present

## 2023-06-18 DIAGNOSIS — I2510 Atherosclerotic heart disease of native coronary artery without angina pectoris: Secondary | ICD-10-CM | POA: Insufficient documentation

## 2023-06-18 DIAGNOSIS — Z95810 Presence of automatic (implantable) cardiac defibrillator: Secondary | ICD-10-CM | POA: Diagnosis not present

## 2023-06-18 DIAGNOSIS — I509 Heart failure, unspecified: Secondary | ICD-10-CM | POA: Diagnosis present

## 2023-06-18 LAB — ECHOCARDIOGRAM COMPLETE
Calc EF: 34.1 %
S' Lateral: 4.7 cm
Single Plane A2C EF: 35.1 %
Single Plane A4C EF: 31.3 %

## 2023-06-18 NOTE — Telephone Encounter (Signed)
 Secure chat sent to Dr Mayford Knife to please advise that patients Ramp is currently off. Awaiting advisement.

## 2023-06-18 NOTE — Telephone Encounter (Addendum)
 Per Dr Mayford Knife: I am find with her changing back to auto CPAP from 4 to 15cm H2o. Patient has been notified that the order was sent to [facility] and she wants to try the pressure change first before accepting the referral transfer.  Order placed to [facility].

## 2023-06-18 NOTE — Progress Notes (Signed)
 Office Visit Note  Patient: Christine [name]             Date of Birth: [birth year]           MRN: [id number]             PCP: Marcine Matar, MD Referring: Marcine Matar, MD Visit Date: 06/19/2023   Subjective:  Follow-up (Patient states a lot of her skin irritation has cleared up. )   Discussed the use of AI scribe software for clinical note transcription with the patient, who gave verbal consent to proceed.  History of Present Illness   Christine [name] "[name]" is a 58 year old female with skin inflammation and positive ANA who presents for follow-up on her symptoms and abnormal lab results.  She has experienced significant improvement in her skin inflammation since the last visit. There are no new outbreaks, and her arms have cleared up. Her legs, although still unsightly, are not itching, and there are no new lesions. She is on a high dose of antihistamines, taking two Zyrtec and Pepcid, which she believes may contribute to her fatigue.  Her finger discoloration has improved, but her hands and feet still get extremely cold, especially at night. Wearing socks to bed helps alleviate the cold sensation. She wakes up with cold hands and feet but finds it manageable. No swelling is reported.  She has a history of a low positive RNP antibody result and a positive ANA test. Her sedimentation rate was not elevated at the time of testing.  She is on metoprolol for her heart condition, with no recent changes in dosage. She has been on Entyvio for about a year for her gastrointestinal symptoms, which have shown significant improvement. Previously, she experienced severe diarrhea that restricted her ability to leave the house, but now she has better control over her symptoms.  She experiences persistent fatigue, which she attributes to multiple factors, including her heart medication, Crohn's disease, and possibly diabetes.    Previous HPI 02/21/23 Christine [name] is a 58 y.o. female  here for evaluation of raynaud's symptoms with a history of diffuse rashes and complicated crohn's disease on entyvio.  Original Crohn's starting May 2023 with loose watery stools found to have ileitis and distal ileum stricture.  Does have significant medical history including congestive heart failure (LVEF < 20%). She has been dealing with diffuse rashes ongoing since around March of this year.  These have broken out in several different areas but the most prominent being a large flat erythematous patch on the left thigh lasting for hours at a time.  Primarily itching sometimes with burning sensation.  Did not notice any difference in symptoms before versus after starting Entyvio treatment.  She did not see any resolution with prednisone.  So far she is try to treat treatments including loratadine and Benadryl were not helpful for the visible rash or the itching.  She did see dermatology for a skin biopsy apparently this indicated some type of inflammation and was prescribed topical triamcinolone as a treatment.  Was not recommended any other systemic therapy and she is not sure about specific diagnosis.  She did have some lab test including antibody test with negative Sjogren syndrome markers. Besides these rashes she has been seeing increased frequency of toe discoloration becoming blue with numbness and tingling sensation.  Not associated with any visible ulcers or pitting. Also noted to have mild eosinophilia in [name] labs and appointment with allergy clinic in December.   Review  of Systems  Constitutional:  Positive for fatigue.  HENT:  Positive for mouth dryness. Negative for mouth sores.   Eyes:  Negative for dryness.  Respiratory:  Positive for shortness of breath.   Cardiovascular:  Positive for palpitations. Negative for chest pain.  Gastrointestinal:  Positive for constipation and diarrhea. Negative for blood in stool.  Endocrine: Negative for increased urination.  Genitourinary:  Negative  for involuntary urination.  Musculoskeletal:  Negative for joint pain, gait problem, joint pain, joint swelling, myalgias, muscle weakness, morning stiffness, muscle tenderness and myalgias.  Skin:  Positive for sensitivity to sunlight. Negative for color change, rash and hair loss.  Allergic/Immunologic: Negative for susceptible to infections.  Neurological:  Negative for dizziness and headaches.  Hematological:  Negative for swollen glands.  Psychiatric/Behavioral:  Positive for depressed mood and sleep disturbance. The patient is nervous/anxious.     PMFS History:  Patient Active Problem List   Diagnosis Date Noted   Abnormal laboratory test result 06/26/2023   Crohn's disease involving terminal ileum (HCC) 07/23/2022   Crohn's disease of large intestine with other complication (HCC) 05/11/2022   Mild major depression (HCC) 05/11/2022   Paroxysmal tachycardia, unspecified (HCC) 05/11/2022   Essential hypertension 03/02/2022   Acute pancreatitis 03/01/2022   Hypokalemia 03/01/2022   Ileitis 03/01/2022   Diarrhea 02/26/2022   Nausea without vomiting 02/26/2022   Loss of weight 02/26/2022   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   Ulcer of ileum 02/26/2022   Diverticulosis of colon without hemorrhage 02/26/2022   Cardiomyopathy (HCC) ischemic and non-ischemic 01/16/2022   Abdominal aortic aneurysm (AAA) 3.0 cm to 5.0 cm in diameter in female (HCC) 01/09/2022   Polycythemia 01/09/2022   Chronic combined systolic and diastolic heart failure (HCC) 08/24/2021   CAD in native artery 08/24/2021   S/P ICD (internal cardiac defibrillator) procedure 08/10/2021   Obstructive sleep apnea 05/05/2021   Former smoker 02/06/2021   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   Obesity (BMI 30.0-34.9) 02/06/2021   Anxiety 01/20/2021   Tobacco abuse 01/20/2021   Shortness of breath 01/20/2021   Chest tightness     Past Medical History:  Diagnosis Date   Allergy    Anxiety 01/20/2021   CAD in  native artery 08/24/2021   CHF (congestive heart failure) (HCC)    Chronic combined systolic and diastolic heart failure (HCC) 08/24/2021   Complication of anesthesia    Crohn's colitis (HCC)    Depression    Hyperlipidemia    Myocardial infarction (HCC)    Presence of cardiac defibrillator 07/2021   Shortness of breath 01/20/2021   Sleep apnea 07/2021   Tobacco abuse 01/20/2021   Urticaria     Family History  Problem Relation Age of Onset   Eczema Mother    Stroke Mother    Atrial fibrillation Mother    Heart failure Father    Asthma Brother    Stomach cancer Maternal Grandfather    Liver cancer Neg Hx    Esophageal cancer Neg Hx    Colon polyps Neg Hx    Colon cancer Neg Hx    Allergic rhinitis Neg Hx    Angioedema Neg Hx    Urticaria Neg Hx    Past Surgical History:  Procedure Laterality Date   ABDOMINAL HYSTERECTOMY     BIOPSY  02/26/2022   Procedure: BIOPSY;  Surgeon: Shellia Cleverly, DO;  Location: [facility];  Service: Gastroenterology;;   CARDIAC CATHETERIZATION     CHOLECYSTECTOMY     COLONOSCOPY WITH PROPOFOL  N/A 02/26/2022   Procedure: COLONOSCOPY WITH PROPOFOL;  Surgeon: Shellia Cleverly, DO;  Location: [facility];  Service: Gastroenterology;  Laterality: N/A;   ESOPHAGOGASTRODUODENOSCOPY (EGD) WITH PROPOFOL N/A 02/26/2022   Procedure: ESOPHAGOGASTRODUODENOSCOPY (EGD) WITH PROPOFOL;  Surgeon: Shellia Cleverly, DO;  Location: [facility];  Service: Gastroenterology;  Laterality: N/A;   ICD IMPLANT N/A 07/21/2021   Procedure: ICD IMPLANT;  Surgeon: Duke Salvia, MD;  Location: [facility]VASIVE CV LAB;  Service: Cardiovascular;  Laterality: N/A;   NECK SURGERY     OVARIAN CYST SURGERY     POLYPECTOMY  02/26/2022   Procedure: POLYPECTOMY;  Surgeon: Shellia Cleverly, DO;  Location: [facility];  Service: Gastroenterology;;   RIGHT/LEFT HEART CATH AND CORONARY ANGIOGRAPHY N/A 01/23/2021   Procedure: RIGHT/LEFT HEART CATH AND CORONARY ANGIOGRAPHY;   Surgeon: Dolores Patty, MD;  Location: [facility] INVASIVE CV LAB;  Service: Cardiovascular;  Laterality: N/A;   TONSILLECTOMY     Social History   Social History Narrative   Not on file   Immunization History  Administered Date(s) Administered   Hepb-cpg 10/04/2022, 11/06/2022   Influenza, Seasonal, Injecte, Preservative Fre 01/15/2023   Influenza,inj,Quad PF,6+ Mos 02/06/2021, 01/09/2022   PFIZER(Purple Top)SARS-COV-2 Vaccination 08/29/2019, 09/23/2019, 04/02/2020   PNEUMOCOCCAL CONJUGATE-20 02/06/2021   Tdap 09/05/2021   Zoster Recombinant(Shingrix) 05/11/2022, 01/29/2023     Objective: Vital Signs: BP 95/64 (BP Location: Left Arm, Patient Position: Sitting, Cuff Size: Normal)   Pulse 77   Resp 14   Ht 5\' 10"  (1.778 m)   Wt 182 lb (82.6 kg)   BMI 26.11 kg/m    Physical Exam Eyes:     Conjunctiva/sclera: Conjunctivae normal.  Cardiovascular:     Rate and Rhythm: Normal rate and regular rhythm.  Pulmonary:     Effort: Pulmonary effort is normal.     Breath sounds: Normal breath sounds.  Musculoskeletal:     Right lower leg: No edema.     Left lower leg: No edema.  Lymphadenopathy:     Cervical: No cervical adenopathy.  Skin:    General: Skin is warm and dry.     Findings: No rash.  Neurological:     Mental Status: She is alert.  Psychiatric:        Mood and Affect: Mood normal.      Musculoskeletal Exam:  Shoulders full ROM no tenderness or swelling Elbows full ROM no tenderness or swelling Wrists full ROM no tenderness or swelling Fingers full ROM no tenderness or swelling Knees full ROM no tenderness or swelling Ankles full ROM no tenderness or swelling   Investigation: No additional findings.  Imaging: ECHOCARDIOGRAM COMPLETE Result Date: 06/18/2023    ECHOCARDIOGRAM REPORT   Patient Name:   ANGELYNN LEMUS Date of Exam: 06/18/2023 Medical Rec #:  [id number]       Height:       70.0 in Accession #:    [id number]      Weight:       182.4 lb Date of Birth:   [birth year]       BSA:          2.007 m Patient Age:    57 years        BP:           101/65 mmHg Patient Gender: F               HR:           80 bpm. Exam Location:  Outpatient Procedure: 2D  Echo, Color Doppler and Cardiac Doppler (Both Spectral and Color            Flow Doppler were utilized during procedure). Indications:    I50.22 Chronic systolic (congestive) heart failure  History:        Patient has prior history of Echocardiogram examinations, most                 recent 08/01/2022. CHF, CAD, Defibrillator; Risk                 Factors:Hypertension and Sleep Apnea.  Sonographer:    Irving Burton Senior RDCS Referring Phys: [id number] DANIEL R BENSIMHON IMPRESSIONS  1. Left ventricular ejection fraction, by estimation, is 30 to 35%. Left ventricular ejection fraction by 2D MOD biplane is 34.1 %. The left ventricle has moderately decreased function. The left ventricle demonstrates regional wall motion abnormalities (see scoring diagram/findings for description). The left ventricular internal cavity size was moderately dilated. Left ventricular diastolic parameters are indeterminate.  2. Right ventricular systolic function is low normal. The right ventricular size is normal. There is normal pulmonary artery systolic pressure. The estimated right ventricular systolic pressure is 18.2 mmHg.  3. The mitral valve is normal in structure. Trivial mitral valve regurgitation.  4. The aortic valve is tricuspid. Aortic valve regurgitation is not visualized.  5. There is mild dilatation of the ascending aorta, measuring 40 mm.  6. The inferior vena cava is normal in size with greater than 50% respiratory variability, suggesting right atrial pressure of 3 mmHg. FINDINGS  Left Ventricle: Left ventricular ejection fraction, by estimation, is 30 to 35%. Left ventricular ejection fraction by 2D MOD biplane is 34.1 %. The left ventricle has moderately decreased function. The left ventricle demonstrates regional wall motion abnormalities. The  left ventricular internal cavity size was moderately dilated. There is no left ventricular hypertrophy. Left ventricular diastolic parameters are indeterminate.  LV Wall Scoring: The entire apex is akinetic. The anterior wall, antero-lateral wall, anterior septum, inferior wall, posterior wall, mid inferoseptal segment, and basal inferoseptal segment are hypokinetic. Right Ventricle: The right ventricular size is normal. No increase in right ventricular wall thickness. Right ventricular systolic function is low normal. There is normal pulmonary artery systolic pressure. The tricuspid regurgitant velocity is 1.95 m/s,  and with an assumed right atrial pressure of 3 mmHg, the estimated right ventricular systolic pressure is 18.2 mmHg. Left Atrium: Left atrial size was normal in size. Right Atrium: Right atrial size was normal in size. Pericardium: There is no evidence of pericardial effusion. Mitral Valve: The mitral valve is normal in structure. Trivial mitral valve regurgitation. Tricuspid Valve: The tricuspid valve is normal in structure. Tricuspid valve regurgitation is trivial. Aortic Valve: The aortic valve is tricuspid. Aortic valve regurgitation is not visualized. Pulmonic Valve: The pulmonic valve was normal in structure. Pulmonic valve regurgitation is not visualized. Aorta: The aortic root is normal in size and structure. There is mild dilatation of the ascending aorta, measuring 40 mm. Venous: The inferior vena cava is normal in size with greater than 50% respiratory variability, suggesting right atrial pressure of 3 mmHg. IAS/Shunts: No atrial level shunt detected by color flow Doppler. Additional Comments: A device lead is visualized.  LEFT VENTRICLE PLAX 2D                        Biplane EF (MOD) LVIDd:         5.30 cm  LV Biplane EF:   Left LVIDs:         4.70 cm                          ventricular LV PW:         0.70 cm                          ejection LV IVS:        0.60 cm                           fraction by LVOT diam:     2.10 cm                          2D MOD LV SV:         69                               biplane is LV SV Index:   34                               34.1 %. LVOT Area:     3.46 cm                                Diastology                                LV e' medial:  3.05 cm/s LV Volumes (MOD)               LV e' lateral: 11.00 cm/s LV vol d, MOD    136.0 ml A2C: LV vol d, MOD    166.0 ml A4C: LV vol s, MOD    88.3 ml A2C: LV vol s, MOD    114.0 ml A4C: LV SV MOD A2C:   47.7 ml LV SV MOD A4C:   166.0 ml LV SV MOD BP:    52.0 ml RIGHT VENTRICLE RV S prime:     10.30 cm/s TAPSE (M-mode): 1.4 cm LEFT ATRIUM             Index        RIGHT ATRIUM           Index LA diam:        2.30 cm 1.15 cm/m   RA Area:     11.30 cm LA Vol (A2C):   29.7 ml 14.80 ml/m  RA Volume:   25.50 ml  12.70 ml/m LA Vol (A4C):   18.3 ml 9.12 ml/m LA Biplane Vol: 24.4 ml 12.16 ml/m  AORTIC VALVE LVOT Vmax:   109.00 cm/s LVOT Vmean:  80.800 cm/s LVOT VTI:    0.198 m  AORTA Ao Root diam: 3.60 cm Ao Asc diam:  4.00 cm TRICUSPID VALVE TR Peak grad:   15.2 mmHg TR Vmax:        195.00 cm/s  SHUNTS Systemic VTI:  0.20 m Systemic Diam: 2.10 cm Chilton Si MD Electronically signed by Chilton Si MD Signature Date/Time: 06/18/2023/4:46:59 PM    Final    DG Chest 2 View Result Date: 05/28/2023  CLINICAL DATA:  Shortness of breath. EXAM: CHEST - 2 VIEW COMPARISON:  Chest radiograph dated 12/17/2022. FINDINGS: No focal consolidation, pleural effusion, or pneumothorax. Right upper lobe subpleural scarring. The cardiac silhouette is within normal limits. Left pectoral AICD device. No acute osseous pathology. IMPRESSION: No active cardiopulmonary disease. Electronically Signed   By: Elgie Collard M.D.   On: 05/28/2023 16:41    Recent Labs: Lab Results  Component Value Date   WBC 7.8 04/22/2023   HGB 16.0 (H) 04/22/2023   PLT 361 04/22/2023   NA 137 05/22/2023   K 3.8 05/22/2023   CL 93 (L) 05/22/2023    CO2 30 05/22/2023   GLUCOSE 116 (H) 05/22/2023   BUN 17 05/22/2023   CREATININE 1.31 (H) 05/22/2023   BILITOT 0.5 04/22/2023   ALKPHOS 145 (H) 04/22/2023   AST 26 04/22/2023   ALT 21 04/22/2023   PROT 7.6 04/22/2023   ALBUMIN 4.7 04/22/2023   CALCIUM 10.3 05/22/2023   GFRAA  03/31/2008    >60        The eGFR has been calculated using the MDRD equation. This calculation has not been validated in all clinical   QFTBGOLDPLUS NEGATIVE 07/10/2022    Speciality Comments: No specialty comments available.  Procedures:  No procedures performed Allergies: Gabapentin, Sulfa antibiotics, Erythromycin, Tramadol, Ibuprofen, and Tape   Assessment / Plan:     Visit Diagnoses: Abnormal laboratory test result Positive RNP Antibody Low positive result with no significant clinical symptoms. Sedimentation rate normal. Unclear if this is indicative of a disease or an incidental finding. -Consider retesting in a year or if symptoms develop else monitoring.  Skin Inflammation Improvement noted with no new outbreaks. Currently on high dose antihistamines (Zyrtec and Pepcid).  Raynaud's Phenomenon Cold hands and feet, especially upon waking. No significant capillary changes noted on examination. Patient is on metoprolol, a beta blocker, which can contribute to Raynaud's symptoms. No evidence of damaging digital ischemia.  Crohn's Disease Managed with Entyvio infusions. Improvement in GI symptoms noted. -Continue current regimen.  Follow-up As needed, potentially in 9 months. If symptoms remain stable, no need for immediate follow-up. If symptoms worsen, patient to return for evaluation.     Orders: No orders of the defined types were placed in this encounter.  No orders of the defined types were placed in this encounter.    Follow-Up Instructions: Return in about 9 months (around 03/20/2024), or if symptoms worsen or fail to improve, for Raynaud's/rashes PRN.   Fuller Plan,  MD  Note - This record has been created using [employer].  Chart creation errors have been sought, but may not always  have been located. Such creation errors do not reflect on  the standard of medical care.

## 2023-06-19 ENCOUNTER — Other Ambulatory Visit (HOSPITAL_COMMUNITY): Payer: Self-pay

## 2023-06-19 ENCOUNTER — Other Ambulatory Visit: Payer: Self-pay

## 2023-06-19 ENCOUNTER — Ambulatory Visit: Payer: Medicaid Other | Attending: Internal Medicine | Admitting: Internal Medicine

## 2023-06-19 ENCOUNTER — Encounter: Payer: Self-pay | Admitting: Internal Medicine

## 2023-06-19 VITALS — BP 95/64 | HR 77 | Resp 14 | Ht 70.0 in | Wt 182.0 lb

## 2023-06-19 DIAGNOSIS — K5000 Crohn's disease of small intestine without complications: Secondary | ICD-10-CM

## 2023-06-19 DIAGNOSIS — R899 Unspecified abnormal finding in specimens from other organs, systems and tissues: Secondary | ICD-10-CM

## 2023-06-19 MED ORDER — METOLAZONE 2.5 MG PO TABS
2.5000 mg | ORAL_TABLET | ORAL | 1 refills | Status: DC
  Filled 2023-06-19 – 2023-06-20 (×2): qty 8, 28d supply, fill #0
  Filled 2023-07-25: qty 8, 28d supply, fill #1

## 2023-06-20 ENCOUNTER — Ambulatory Visit (HOSPITAL_COMMUNITY)
Admission: RE | Admit: 2023-06-20 | Discharge: 2023-06-20 | Disposition: A | Discharge status: Home / Self Care | Payer: Medicaid Other | Source: Ambulatory Visit | Attending: Gastroenterology | Admitting: Gastroenterology

## 2023-06-20 ENCOUNTER — Telehealth: Payer: Self-pay

## 2023-06-20 ENCOUNTER — Other Ambulatory Visit (HOSPITAL_COMMUNITY): Payer: Self-pay

## 2023-06-20 ENCOUNTER — Encounter (HOSPITAL_COMMUNITY): Payer: Medicaid Other

## 2023-06-20 ENCOUNTER — Other Ambulatory Visit (HOSPITAL_COMMUNITY): Payer: Self-pay | Admitting: Internal Medicine

## 2023-06-20 ENCOUNTER — Other Ambulatory Visit: Payer: Self-pay

## 2023-06-20 DIAGNOSIS — K5000 Crohn's disease of small intestine without complications: Secondary | ICD-10-CM | POA: Insufficient documentation

## 2023-06-20 MED ORDER — VEDOLIZUMAB 300 MG IV SOLR
300.0000 mg | INTRAVENOUS | Status: DC
  Administered 2023-06-20: 300 mg via INTRAVENOUS
  Filled 2023-06-20: qty 5

## 2023-06-20 MED FILL — Ivabradine HCl Tab 5 MG (Base Equiv): ORAL | 30 days supply | Qty: 30 | Fill #0 | Status: CN

## 2023-06-20 MED FILL — Ivabradine HCl Tab 5 MG (Base Equiv): ORAL | 30 days supply | Qty: 30 | Fill #0 | Status: AC

## 2023-06-20 NOTE — Telephone Encounter (Signed)
 Entyvio 300 mg every 8 weeks order renewed in Epic. [facility]ifer[facility] lab order entered in Epic to be collected at infusion center at the time of patient's next appt.

## 2023-06-23 NOTE — Progress Notes (Unsigned)
 Follow Up Note  RE: Christine [name] MRN: [id number] DOB: [birth year] Date of Office Visit: 06/24/2023  Referring provider: Marcine Matar, MD Primary care provider: Marcine Matar, MD  Chief Complaint: No chief complaint on file.  History of Present Illness: I had the pleasure of seeing Christine [name] for a follow up visit at the [facility] on 06/23/2023. She is a 58 y.o. female, who is being followed for pruritic rash and allergic rhinitis. Her previous allergy office visit was on 04/22/2023 with Dr. Selena Batten. Today is a regular follow up visit.  Discussed the use of AI scribe software for clinical note transcription with the patient, who gave verbal consent to proceed.  History of Present Illness            Tryptase 12.4   Assessment and Plan: Christine [name] is a 58 y.o. female with: Rash and other nonspecific skin eruption Pruritus Past history - daily pruritic rash on extremities and neck, not torso. Previous biopsy showed Jesner's lymphocytic infiltrates. Current treatment with loratadine and triamcinolone cream provides minimal relief. No clear triggers identified. Gabapentin caused diarrhea. Medical history significant for DM, CHF, CAD, Crohn's.  Interim history - Improvement in itching and rash with Zyrtec and Famotidine. No new lesions. 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  Etiology unclear.  Keep track of rashes and take pictures. If worsens - need to rule out hereditary alpha tryptasemia.  Write down what you had done/eaten during flares.  May use benadryl cream as needed for itching.  Continue proper skin care. Continue zyrtec (cetirizine) 10mg  twice a day. If symptoms are not controlled or causes drowsiness let us know. Continue Pepcid (famotidine) 20mg  twice a day.  Avoid the following potential triggers: alcohol, tight clothing,  NSAIDs, hot showers and getting overheated. Continue proper skin care.  Get bloodwork. Recommend second opinion referral to dermatology.    Allergic rhinitis due to dust mite Allergic rhinitis due to animal dander Seasonal allergic rhinitis due to pollen Past history - Symptomatic in the spring. Interim history - 2024 bloodwork positive to dust mites and borderline to cat and grass. Continue environmental control measures.  Assessment and Plan              No follow-ups on file.  No orders of the defined types were placed in this encounter.  Lab Orders  No laboratory test(s) ordered today    Diagnostics: Spirometry:  Tracings reviewed. Her effort: {Blank single:19197::"Good reproducible efforts.","It was hard to get consistent efforts and there is a question as to whether this reflects a maximal maneuver.","Poor effort, data can not be interpreted."} FVC: ***L FEV1: ***L, ***% predicted FEV1/FVC ratio: ***% Interpretation: {Blank single:19197::"Spirometry consistent with mild obstructive disease","Spirometry consistent with moderate obstructive disease","Spirometry consistent with severe obstructive disease","Spirometry consistent with possible restrictive disease","Spirometry consistent with mixed obstructive and restrictive disease","Spirometry uninterpretable due to technique","Spirometry consistent with normal pattern","No overt abnormalities noted given today's efforts"}.  Please see scanned spirometry results for details.  Skin Testing: {Blank single:19197::"Select foods","Environmental allergy panel","Environmental allergy panel and select foods","Food allergy panel","None","Deferred due to recent antihistamines use"}. *** Results discussed with patient/family.   Medication List:  Current Outpatient Medications  Medication Sig Dispense Refill  . Accu-Chek Softclix Lancets lancets Use as instructed.  Check blood sugar daily before breakfast. 100 each 12  .  acetaminophen (TYLENOL) 500 MG tablet Take 1,000 mg by mouth  3 (three) times daily as needed for moderate pain or headache.    . ALPRAZolam (XANAX) 1 MG tablet Take 2 mg by mouth 3 (three) times daily.    Marland Kitchen alum & mag hydroxide-simeth (MAALOX/MYLANTA) 200-200-20 MG/5ML suspension Take 30 mLs by mouth every 4 (four) hours as needed for indigestion or heartburn. 355 mL 0  . apixaban (ELIQUIS) 5 MG TABS tablet Take 1 tablet (5 mg total) by mouth 2 (two) times daily. 60 tablet 11  . atorvastatin (LIPITOR) 80 MG tablet Take 1 tablet (80 mg total) by mouth daily. 90 tablet 3  . Blood Glucose Monitoring Suppl (ACCU-CHEK GUIDE) w/Device KIT Check blood sugar once daily before breakfast 1 kit 0  . buPROPion (WELLBUTRIN XL) 150 MG 24 hr tablet Take 150 mg by mouth daily.    . cetirizine (ZYRTEC ALLERGY) 10 MG tablet Take 1 tablet (10 mg total) by mouth 2 (two) times daily. 60 tablet 5  . ivabradine (CORLANOR) 5 MG TABS tablet Take 1/2 tablet (2.5 mg total) by mouth 2 (two) times daily. 30 tablet 5  . cyclobenzaprine (FLEXERIL) 10 MG tablet Take 10 mg by mouth as needed for muscle spasms.    . dapagliflozin propanediol (FARXIGA) 10 MG TABS tablet Take 1 tablet (10 mg total) by mouth daily. 30 tablet 9  . diphenoxylate-atropine (LOMOTIL) 2.5-0.025 MG tablet Take 2 tablets by mouth 4 (four) times daily as needed for diarrhea/loose stools. 60 tablet 3  . famotidine (PEPCID) 20 MG tablet Take 1 tablet (20 mg total) by mouth 2 (two) times daily. 60 tablet 5  . feeding supplement (ENSURE ENLIVE / ENSURE PLUS) LIQD Take 237 mLs by mouth 2 (two) times daily between meals. 237 mL 12  . Fezolinetant (VEOZAH) 45 MG TABS Take 1 tablet (45 mg total) by mouth daily. 30 tablet 2  . furosemide (LASIX) 20 MG tablet Take 4 tablets (80 mg total) by mouth 2 (two) times daily. 240 tablet 3  . glucose blood (ACCU-CHEK GUIDE) test strip Use as instructed to check blood sugar daily before breakfast 100 each 12  . metolazone  (ZAROXOLYN) 2.5 MG tablet Take 1 tablet (2.5 mg total) by mouth 2 (two) times a week. 8 tablet 1  . metoprolol succinate (TOPROL-XL) 25 MG 24 hr tablet Take 1/2 tablet (12.5 mg total) by mouth daily. 45 tablet 1  . metroNIDAZOLE (METROGEL) 1 % gel Apply 1 Application topically as needed (rash).    . Multiple Vitamins-Minerals (MULTIVITAMIN WITH MINERALS) tablet Take 1 tablet by mouth daily.    Marland Kitchen oxymetazoline (AFRIN) 0.05 % nasal spray Place 1-2 sprays into both nostrils 2 (two) times daily as needed for congestion.    . Pancrelipase, Lip-Prot-Amyl, (CREON PO) Take 3,600 Units by mouth in the morning, at noon, and at bedtime.    . potassium chloride SA (KLOR-CON M) 20 MEQ tablet Take 6 tablets (120 mEq total) by mouth every morning AND 5 tablets (100 mEq total) daily at 12 noon AND 6 tablets (120 mEq total) every evening. 510 tablet 3  . prazosin (MINIPRESS) 2 MG capsule Take 2-6 mg by mouth daily as needed (for sleep).    . promethazine (PHENERGAN) 25 MG tablet Take 1 tablet (25 mg total) by mouth every 6 (six) to 8 (eight) hours for nausea or vomiting. 90 tablet 3  . sitaGLIPtin (JANUVIA) 50 MG tablet Take 1 tablet (50 mg total) by mouth daily. 90 tablet 1  . spironolactone (ALDACTONE) 25 MG tablet Take 1 tablet (25 mg  total) by mouth daily. 90 tablet 3  . triamcinolone cream (KENALOG) 0.1 % Apply 1 Application topically as needed.    . valACYclovir (VALTREX) 500 MG tablet Take 1 tablet (500 mg total) by mouth 2 (two) times daily. 6 tablet 2  . vedolizumab (ENTYVIO) 300 MG injection Inject 300 mg into the vein every 2 (two) months.    . zolpidem (AMBIEN) 10 MG tablet Take 5-10 mg by mouth at bedtime.     No current facility-administered medications for this visit.   Allergies: Allergies  Allergen Reactions  . Gabapentin Diarrhea  . Sulfa Antibiotics Hives, Itching and Rash  . Erythromycin Hives  . Tramadol Rash and Other (See Comments)    Urinary retention    . Ibuprofen Itching  .  Tape Rash   I reviewed her past medical history, social history, family history, and environmental history and no significant changes have been reported from her previous visit.  Review of Systems  Constitutional:  Negative for appetite change, chills, fever and unexpected weight change.  HENT:  Negative for congestion and rhinorrhea.   Eyes:  Negative for itching.  Respiratory:  Positive for shortness of breath. Negative for cough, chest tightness and wheezing.   Cardiovascular:  Negative for chest pain.  Gastrointestinal:  Negative for abdominal pain.  Genitourinary:  Negative for difficulty urinating.  Skin:  Positive for rash.       improved  Neurological:  Negative for headaches.   Objective: There were no vitals taken for this visit. There is no height or weight on file to calculate BMI. Physical Exam Vitals and nursing note reviewed.  Constitutional:      Appearance: Normal appearance. She is well-developed.  HENT:     Head: Normocephalic and atraumatic.     Right Ear: Tympanic membrane and external ear normal.     Left Ear: Tympanic membrane and external ear normal.     Nose: Nose normal.     Mouth/Throat:     Mouth: Mucous membranes are moist.     Pharynx: Oropharynx is clear.  Eyes:     Conjunctiva/sclera: Conjunctivae normal.  Cardiovascular:     Rate and Rhythm: Normal rate and regular rhythm.     Heart sounds: Normal heart sounds. No murmur heard.    No friction rub. No gallop.  Pulmonary:     Effort: Pulmonary effort is normal.     Breath sounds: Normal breath sounds. No wheezing, rhonchi or rales.  Musculoskeletal:     Cervical back: Neck supple.  Skin:    General: Skin is warm.     Findings: Rash present.     Comments: Multiple circular clustering of skin changes on lower and upper extremities with excoriation marks.   Neurological:     Mental Status: She is alert and oriented to person, place, and time.  Psychiatric:        Behavior: Behavior normal.   Previous notes and tests were reviewed. The plan was reviewed with the patient/family, and all questions/concerned were addressed.  It was my pleasure to see Carmelle today and participate in her care. Please feel free to contact me with any questions or concerns.  Sincerely,  Wyline Mood, DO Allergy & Immunology  [facility] office: [phone] [facility] office: [phone]

## 2023-06-24 ENCOUNTER — Encounter: Payer: Self-pay | Admitting: Allergy

## 2023-06-24 ENCOUNTER — Other Ambulatory Visit (HOSPITAL_COMMUNITY): Payer: Self-pay | Admitting: Family Medicine

## 2023-06-24 ENCOUNTER — Other Ambulatory Visit: Payer: Self-pay

## 2023-06-24 ENCOUNTER — Other Ambulatory Visit (HOSPITAL_COMMUNITY): Payer: Self-pay

## 2023-06-24 ENCOUNTER — Ambulatory Visit (INDEPENDENT_AMBULATORY_CARE_PROVIDER_SITE_OTHER): Payer: Medicaid Other | Admitting: Allergy

## 2023-06-24 ENCOUNTER — Ambulatory Visit: Payer: Medicaid Other | Attending: Internal Medicine

## 2023-06-24 VITALS — BP 92/60 | HR 84 | Temp 98.0°F

## 2023-06-24 DIAGNOSIS — I5022 Chronic systolic (congestive) heart failure: Secondary | ICD-10-CM | POA: Diagnosis not present

## 2023-06-24 DIAGNOSIS — L282 Other prurigo: Secondary | ICD-10-CM

## 2023-06-24 DIAGNOSIS — Z95810 Presence of automatic (implantable) cardiac defibrillator: Secondary | ICD-10-CM

## 2023-06-24 DIAGNOSIS — J3089 Other allergic rhinitis: Secondary | ICD-10-CM | POA: Diagnosis not present

## 2023-06-24 DIAGNOSIS — J301 Allergic rhinitis due to pollen: Secondary | ICD-10-CM

## 2023-06-24 DIAGNOSIS — J3081 Allergic rhinitis due to animal (cat) (dog) hair and dander: Secondary | ICD-10-CM | POA: Diagnosis not present

## 2023-06-24 MED ORDER — FLUTICASONE PROPIONATE 50 MCG/ACT NA SUSP
1.0000 | Freq: Every day | NASAL | 5 refills | Status: AC | PRN
  Filled 2023-06-24 – 2023-08-12 (×2): qty 16, 30d supply, fill #0

## 2023-06-24 NOTE — Patient Instructions (Addendum)
 Rash/itching  Etiology unclear.  Keep track of rashes and take pictures. Write down what you had done/eaten during flares.  May use benadryl cream as needed for itching.  Continue proper skin care. Use fragrance free and dye free products. No dryer sheets or fabric softener.   Continue zyrtec (cetirizine) 10mg  twice a day. If symptoms are not controlled or causes drowsiness let us know. Continue Pepcid (famotidine) 20mg  twice a day.  Avoid the following potential triggers: alcohol, tight clothing, NSAIDs, hot showers and getting overheated. Continue proper skin care.  Your tryptase was 12.4. Consider ruling out hereditary alpha tryptasemia which is a genetic test that is done out of pocket ($169).   Step down schedule: Decrease Pepcid to 20mg  once a day. Continue with zyrtec 10mg  twice a day. If no hives/itching for 2 weeks then: Decrease zyrtec to 10mg  once a day. Continue Pepcid 20mg  once a day. If you get symptoms then go back to the dose where you didn't have any symptoms.   Environmental allergies Environmental panel was positive to dust mites and borderline to cat and grass pollen. Continue environmental control measures. Use Flonase (fluticasone) nasal spray 1-2 sprays per nostril once a day as needed for nasal congestion.  Nasal saline spray (i.e., Simply Saline) or nasal saline lavage (i.e., NeilMed) is recommended as needed and prior to medicated nasal sprays.  Return in about 6 months (around 12/25/2023). Or sooner if needed.   Skin care recommendations  Bath time: Always use lukewarm water. AVOID very hot or cold water. Keep bathing time to 5-10 minutes. Do NOT use bubble bath. Use a mild soap and use just enough to wash the dirty areas. Do NOT scrub skin vigorously.  After bathing, pat dry your skin with a towel. Do NOT rub or scrub the skin.  Moisturizers and prescriptions:  ALWAYS apply moisturizers immediately after bathing (within 3 minutes). This helps to  lock-in moisture. Use the moisturizer several times a day over the whole body. Good summer moisturizers include: Aveeno, CeraVe, Cetaphil. Good winter moisturizers include: Aquaphor, Vaseline, Cerave, Cetaphil, Eucerin, Vanicream. When using moisturizers along with medications, the moisturizer should be applied about one hour after applying the medication to prevent diluting effect of the medication or moisturize around where you applied the medications. When not using medications, the moisturizer can be continued twice daily as maintenance.  Laundry and clothing: Avoid laundry products with added color or perfumes. Use unscented hypo-allergenic laundry products such as Tide free, Cheer free & gentle, and All free and clear.  If the skin still seems dry or sensitive, you can try double-rinsing the clothes. Avoid tight or scratchy clothing such as wool. Do not use fabric softeners or dyer sheets.   Control of House Dust Mite Allergen Dust mite allergens are a common trigger of allergy and asthma symptoms. While they can be found throughout the house, these microscopic creatures thrive in warm, humid environments such as bedding, upholstered furniture and carpeting. Because so much time is spent in the bedroom, it is essential to reduce mite levels there.  Encase pillows, mattresses, and box springs in special allergen-proof fabric covers or airtight, zippered plastic covers.  Bedding should be washed weekly in hot water (130 F) and dried in a hot dryer. Allergen-proof covers are available for comforters and pillows that can't be regularly washed.  Wash the allergy-proof covers every few months. Minimize clutter in the bedroom. Keep pets out of the bedroom.  Keep humidity less than 50% by using a dehumidifier  or air conditioning. You can buy a humidity measuring device called a hygrometer to monitor this.  If possible, replace carpets with hardwood, linoleum, or washable area rugs. If that's not  possible, vacuum frequently with a vacuum that has a HEPA filter. Remove all upholstered furniture and non-washable window drapes from the bedroom. Remove all non-washable stuffed toys from the bedroom.  Wash stuffed toys weekly.  Pet Allergen Avoidance: Contrary to popular opinion, there are no "hypoallergenic" breeds of dogs or cats. That is because people are not allergic to an animal's hair, but to an allergen found in the animal's saliva, dander (dead skin flakes) or urine. Pet allergy symptoms typically occur within minutes. For some people, symptoms can build up and become most severe 8 to 12 hours after contact with the animal. People with severe allergies can experience reactions in public places if dander has been transported on the pet owners' clothing. Keeping an animal outdoors is only a partial solution, since homes with pets in the yard still have higher concentrations of animal allergens. Before getting a pet, ask your allergist to determine if you are allergic to animals. If your pet is already considered part of your family, try to minimize contact and keep the pet out of the bedroom and other rooms where you spend a great deal of time. As with dust mites, vacuum carpets often or replace carpet with a hardwood floor, tile or linoleum. High-efficiency particulate air (HEPA) cleaners can reduce allergen levels over time. While dander and saliva are the source of cat and dog allergens, urine is the source of allergens from rabbits, hamsters, mice and [place] pigs; so ask a non-allergic family member to clean the animal's cage. If you have a pet allergy, talk to your allergist about the potential for allergy immunotherapy (allergy shots). This strategy can often provide long-term relief.  Reducing Pollen Exposure Pollen seasons: trees (spring), grass (summer) and ragweed/weeds (fall). Keep windows closed in your home and car to lower pollen exposure.  Install air conditioning in the  bedroom and throughout the house if possible.  Avoid going out in dry windy days - especially early morning. Pollen counts are highest between 5 - 10 AM and on dry, hot and windy days.  Save outside activities for late afternoon or after a heavy rain, when pollen levels are lower.  Avoid mowing of grass if you have grass pollen allergy. Be aware that pollen can also be transported indoors on people and pets.  Dry your clothes in an automatic dryer rather than hanging them outside where they might collect pollen.  Rinse hair and eyes before bedtime.

## 2023-06-25 ENCOUNTER — Other Ambulatory Visit (HOSPITAL_COMMUNITY): Payer: Self-pay

## 2023-06-25 ENCOUNTER — Other Ambulatory Visit: Payer: Self-pay

## 2023-06-25 ENCOUNTER — Other Ambulatory Visit (HOSPITAL_COMMUNITY): Payer: Self-pay | Admitting: Family Medicine

## 2023-06-25 MED ORDER — FUROSEMIDE 20 MG PO TABS
80.0000 mg | ORAL_TABLET | Freq: Two times a day (BID) | ORAL | 3 refills | Status: DC
  Filled 2023-06-25: qty 240, 30d supply, fill #0

## 2023-06-26 ENCOUNTER — Telehealth (HOSPITAL_COMMUNITY): Payer: Self-pay

## 2023-06-26 DIAGNOSIS — R899 Unspecified abnormal finding in specimens from other organs, systems and tissues: Secondary | ICD-10-CM | POA: Insufficient documentation

## 2023-06-26 NOTE — Telephone Encounter (Signed)
 Called to confirm/remind patient of their appointment [facility] on 06/27/23.   Patient reminded to bring all medications and/or complete list.  Confirmed patient has transportation. Gave directions, instructed to utilize valet parking.  Confirmed appointment prior to ending call.

## 2023-06-26 NOTE — Progress Notes (Signed)
 EPIC Encounter for ICM Monitoring  Patient Name: Christine [name] is a 58 y.o. female Date: 06/26/2023 Primary Care Physican: Marcine Matar, MD Primary Cardiologist: [place] Electrophysiologist: Graciela Husbands Last Weight: 185.5 lbs 12/19/2022 Weight: 179 lbs  02/06/2023 Weight: 182.5 lbs 02/13/2023 Weight: 181 lbs  03/12/2023 Weight: 183 lbs  04/16/2023 Weight: 183 lbs 05/27/2023 Weight: 182 - 183 lbs (baseline)          06/24/2023 Weight: 181 lbs 06/26/2023 Weight: 188 lbs                                             Spoke with patient and heart failure questions reviewed.  Transmission results reviewed.  Pt reports rapid weight gain of 7 lbs in 2 days and she feels like she has fluid.  She took Metolazone today.     HeartLogic Heart Failure Index remains 1 suggesting fluid levels are within normal threshold range.     Prescribed:  Furosemide 20 mg take 4 tablet(s) (80 mg total) by mouth twice a day Potassium 20 mEq take 6 tablet(s) (120 mEq total) by mouth every morning, 5 tablets (100 mg total) at [facility] and 6 tablets (120 mEq total) every evening Metolazone 2.5 mg take 1 tablet by mouth two times weekly.  Take additional 40 mEq of Potassium with each dose Spironolactone 25 mg take 1 tablet by mouth daily.    Labs: 05/22/2023 Creatinine 1.31, BUN 17, Potassium 3.8, Sodium 137, GFR 48 05/10/2023 Creatinine 1.58, BUN 31, Potassium 3.3, Sodium 134, GFR 38 04/23/2023 Creatinine 1.29, BUN 19, Potassium 4.1, Sodium 136, GFR 49 04/08/2023 Creatinine 1.00, BUN 16, Potassium 3.5, Sodium 135, GFR >60  A complete set of results can be found in Results Review.   Recommendations:  No changes and encouraged to call if experiencing any fluid symptoms.   Follow-up plan: ICM clinic phone appointment on 07/29/2023.   91 day device clinic remote transmission 07/22/2023.              EP/Cardiology next office visit:  06/27/2023 with HF clinic.  Recall 11/18/2023 with Dr Gala Romney.  Recall 03/15/2024  with Dr  Graciela Husbands.          Copy of ICM check sent to Dr. Graciela Husbands.  Sent to Prince Rome, NP at [facility] clinic as FYI for 3/13 OV.  3 Month HeartLogicT Heart Failure Index:    8 Day Data Trend:          Karie Soda, RN 06/26/2023 3:16 PM

## 2023-06-27 ENCOUNTER — Other Ambulatory Visit (HOSPITAL_COMMUNITY): Payer: Self-pay

## 2023-06-27 ENCOUNTER — Encounter (HOSPITAL_COMMUNITY): Payer: Self-pay

## 2023-06-27 ENCOUNTER — Ambulatory Visit (HOSPITAL_COMMUNITY)
Admission: RE | Admit: 2023-06-27 | Discharge: 2023-06-27 | Disposition: A | Discharge status: Home / Self Care | Payer: Medicaid Other | Source: Ambulatory Visit | Attending: Physician Assistant | Admitting: Physician Assistant

## 2023-06-27 ENCOUNTER — Other Ambulatory Visit: Payer: Self-pay

## 2023-06-27 VITALS — BP 92/60 | HR 75 | Ht 70.0 in | Wt 189.6 lb

## 2023-06-27 DIAGNOSIS — Z7901 Long term (current) use of anticoagulants: Secondary | ICD-10-CM | POA: Diagnosis not present

## 2023-06-27 DIAGNOSIS — Z7984 Long term (current) use of oral hypoglycemic drugs: Secondary | ICD-10-CM | POA: Diagnosis not present

## 2023-06-27 DIAGNOSIS — E876 Hypokalemia: Secondary | ICD-10-CM

## 2023-06-27 DIAGNOSIS — I2510 Atherosclerotic heart disease of native coronary artery without angina pectoris: Secondary | ICD-10-CM | POA: Diagnosis not present

## 2023-06-27 DIAGNOSIS — I5042 Chronic combined systolic (congestive) and diastolic (congestive) heart failure: Secondary | ICD-10-CM

## 2023-06-27 DIAGNOSIS — I714 Abdominal aortic aneurysm, without rupture, unspecified: Secondary | ICD-10-CM | POA: Diagnosis not present

## 2023-06-27 DIAGNOSIS — K5090 Crohn's disease, unspecified, without complications: Secondary | ICD-10-CM | POA: Diagnosis not present

## 2023-06-27 DIAGNOSIS — F419 Anxiety disorder, unspecified: Secondary | ICD-10-CM | POA: Insufficient documentation

## 2023-06-27 DIAGNOSIS — G4733 Obstructive sleep apnea (adult) (pediatric): Secondary | ICD-10-CM | POA: Insufficient documentation

## 2023-06-27 DIAGNOSIS — I471 Supraventricular tachycardia, unspecified: Secondary | ICD-10-CM | POA: Diagnosis not present

## 2023-06-27 DIAGNOSIS — Z5986 Financial insecurity: Secondary | ICD-10-CM | POA: Diagnosis not present

## 2023-06-27 DIAGNOSIS — Z79899 Other long term (current) drug therapy: Secondary | ICD-10-CM | POA: Insufficient documentation

## 2023-06-27 DIAGNOSIS — I255 Ischemic cardiomyopathy: Secondary | ICD-10-CM

## 2023-06-27 DIAGNOSIS — Z87891 Personal history of nicotine dependence: Secondary | ICD-10-CM | POA: Insufficient documentation

## 2023-06-27 DIAGNOSIS — F32A Depression, unspecified: Secondary | ICD-10-CM | POA: Diagnosis not present

## 2023-06-27 DIAGNOSIS — I5022 Chronic systolic (congestive) heart failure: Secondary | ICD-10-CM | POA: Insufficient documentation

## 2023-06-27 DIAGNOSIS — R0602 Shortness of breath: Secondary | ICD-10-CM | POA: Diagnosis present

## 2023-06-27 DIAGNOSIS — I5020 Unspecified systolic (congestive) heart failure: Secondary | ICD-10-CM

## 2023-06-27 DIAGNOSIS — R002 Palpitations: Secondary | ICD-10-CM | POA: Insufficient documentation

## 2023-06-27 DIAGNOSIS — D751 Secondary polycythemia: Secondary | ICD-10-CM | POA: Insufficient documentation

## 2023-06-27 DIAGNOSIS — I493 Ventricular premature depolarization: Secondary | ICD-10-CM | POA: Diagnosis not present

## 2023-06-27 DIAGNOSIS — Z8616 Personal history of COVID-19: Secondary | ICD-10-CM | POA: Diagnosis not present

## 2023-06-27 LAB — COMPREHENSIVE METABOLIC PANEL
ALT: 21 U/L (ref 0–44)
AST: 21 U/L (ref 15–41)
Albumin: 3.6 g/dL (ref 3.5–5.0)
Alkaline Phosphatase: 83 U/L (ref 38–126)
Anion gap: 12 (ref 5–15)
BUN: 23 mg/dL — ABNORMAL HIGH (ref 6–20)
CO2: 31 mmol/L (ref 22–32)
Calcium: 9.5 mg/dL (ref 8.9–10.3)
Chloride: 94 mmol/L — ABNORMAL LOW (ref 98–111)
Creatinine, Ser: 1.08 mg/dL — ABNORMAL HIGH (ref 0.44–1.00)
GFR, Estimated: >60 mL/min (ref >60)
Glucose, Bld: 86 mg/dL (ref 70–99)
Potassium: 4.1 mmol/L (ref 3.5–5.1)
Sodium: 137 mmol/L (ref 135–145)
Total Bilirubin: 0.7 mg/dL (ref 0.0–1.2)
Total Protein: 6.9 g/dL (ref 6.5–8.1)

## 2023-06-27 LAB — BRAIN NATRIURETIC PEPTIDE: B Natriuretic Peptide: 34.5 pg/mL (ref 0.0–100.0)

## 2023-06-27 MED ORDER — TORSEMIDE 20 MG PO TABS
40.0000 mg | ORAL_TABLET | Freq: Two times a day (BID) | ORAL | 3 refills | Status: DC
  Filled 2023-06-27: qty 180, 45d supply, fill #0

## 2023-06-27 MED ORDER — POTASSIUM CHLORIDE CRYS ER 20 MEQ PO TBCR
120.0000 meq | EXTENDED_RELEASE_TABLET | Freq: Three times a day (TID) | ORAL | 5 refills | Status: DC
  Filled 2023-06-27 – 2023-06-28 (×2): qty 540, 30d supply, fill #0
  Filled 2023-08-02: qty 540, 30d supply, fill #1
  Filled 2023-08-12: qty 540, 30d supply, fill #0

## 2023-06-27 NOTE — Progress Notes (Signed)
 ADVANCED HF CLINIC NOTE   PCP: Marcine Matar, MD Cardiology: Dr. Duke Salvia HF Cardiologist: Dr. Gala Romney  Chief Complaint: Shortness of breath HPI: Christine [name] is a 58 y.o.female with history of chronic tobacco use, ADHD, anxiety, depression, CAD, systolic HF   Presented to ED 16/1/09 with increased shortness of breath/tachycardia. Adderrall stopped. Echo with EF 20-25%,  LHC/RHC with single vessel CAD (occluded mLAD) elevated filling pressures and  moderately reduced CO. Digoxin added. Beta blocker stopped.  Discharged to home 01/25/21. Discharge weight 213 pounds.   Echo 05/11/21 EF < 20% LV severely dilated RV ok Mild MR. Referred for ICD.  CPX with very mild HF limitation with elevated Ve/VCO2 slope  S/p [employer] SCI ICD 4/23.  Multiple ED visits for various issues.   Monitor in 12/23: Rare PVCs 2 episodes of brief SVT (4 beats)   Echo (4/24) showed improved EF 35-40%, garde 1 DD, RV ok  Hospitalized 8/24 for hypokalemia. K 2.3. Repleted. Discharged next day.   Covid + 12/15/22. Completed Molnupiravir.   Echo 03/25: EF 30-35%, RV okay  Here today for follow-up on SOB. Weight at home 181 3/10 and up to 188 on 03/12. Took metolazone 3/12. HL Index remained low at 1. Reports weight often goes down to 181 lb after metolazone. Over the next few days often trends up 7-8 lbs til next dose of metolazone. Using metolazone twice a week, sometimes three times. Worried lasix dose is too low. Notes shortness of breath and fatigue with exertion especially when weight is up.     ROS: All systems negative except as listed in HPI, PMH and Problem List.  SH:  Social History   Socioeconomic History   Marital status: Single    Spouse name: Not on file   Number of children: 0   Years of education: 16   Highest education level: Bachelor's degree (e.g., BA, AB, BS)  Occupational History   Not on file  Tobacco Use   Smoking status: Former    Current packs/day: 0.00    Types:  Cigarettes    Quit date: 01/15/2021    Years since quitting: 2.4    Passive exposure: Past   Smokeless tobacco: Never  Vaping Use   Vaping status: Never Used  Substance and Sexual Activity   Alcohol use: Not Currently   Drug use: Never   Sexual activity: Not on file  Other Topics Concern   Not on file  Social History Narrative   Not on file   Social Drivers of Health   Financial Resource Strain: Medium Risk (05/16/2023)   Overall Financial Resource Strain (CARDIA)    Difficulty of Paying Living Expenses: Somewhat hard  Food Insecurity: No Food Insecurity (05/16/2023)   Hunger Vital Sign    Worried About Running Out of Food in the Last Year: Never true    Ran Out of Food in the Last Year: Never true  Transportation Needs: No Transportation Needs (05/16/2023)   PRAPARE - Administrator, Civil Service (Medical): No    Lack of Transportation (Non-Medical): No  Physical Activity: Insufficiently Active (05/16/2023)   Exercise Vital Sign    Days of Exercise per Week: 4 days    Minutes of Exercise per Session: 20 min  Stress: Stress Concern Present (05/16/2023)   [employer] of Occupational Health - Occupational Stress Questionnaire    Feeling of Stress : Very much  Social Connections: Moderately Isolated (05/16/2023)   Social Connection and Isolation Panel [NHANES]  Frequency of Communication with Friends and Family: More than three times a week    Frequency of Social Gatherings with Friends and Family: More than three times a week    Attends Religious Services: 1 to 4 times per year    Active Member of [employer] or Organizations: No    Attends Engineer, structural: Not on file    Marital Status: Never married  Intimate Partner Violence: Unknown (07/21/2021)   Received from [employer], [facility]   HITS    Physically Hurt: Not on file    Insult or Talk Down To: Not on file    Threaten Physical Harm: Not on file    Scream or Curse: Not on file   FH:   Family History  Problem Relation Age of Onset   Eczema Mother    Stroke Mother    Atrial fibrillation Mother    Heart failure Father    Asthma Brother    Stomach cancer Maternal Grandfather    Liver cancer Neg Hx    Esophageal cancer Neg Hx    Colon polyps Neg Hx    Colon cancer Neg Hx    Allergic rhinitis Neg Hx    Angioedema Neg Hx    Urticaria Neg Hx    Past Medical History:  Diagnosis Date   Allergy    Anxiety 01/20/2021   CAD in native artery 08/24/2021   CHF (congestive heart failure) (HCC)    Chronic combined systolic and diastolic heart failure (HCC) 08/24/2021   Complication of anesthesia    Crohn's colitis (HCC)    Depression    Hyperlipidemia    Myocardial infarction (HCC)    Presence of cardiac defibrillator 07/2021   Shortness of breath 01/20/2021   Sleep apnea 07/2021   Tobacco abuse 01/20/2021   Urticaria    Current Outpatient Medications  Medication Sig Dispense Refill   Accu-Chek Softclix Lancets lancets Use as instructed.  Check blood sugar daily before breakfast. 100 each 12   acetaminophen (TYLENOL) 500 MG tablet Take 1,000 mg by mouth 3 (three) times daily as needed for moderate pain or headache.     ALPRAZolam (XANAX) 1 MG tablet Take 2 mg by mouth 3 (three) times daily.     alum & mag hydroxide-simeth (MAALOX/MYLANTA) 200-200-20 MG/5ML suspension Take 30 mLs by mouth every 4 (four) hours as needed for indigestion or heartburn. 355 mL 0   apixaban (ELIQUIS) 5 MG TABS tablet Take 1 tablet (5 mg total) by mouth 2 (two) times daily. 60 tablet 11   atorvastatin (LIPITOR) 80 MG tablet Take 1 tablet (80 mg total) by mouth daily. 90 tablet 3   Blood Glucose Monitoring Suppl (ACCU-CHEK GUIDE) w/Device KIT Check blood sugar once daily before breakfast 1 kit 0   buPROPion (WELLBUTRIN XL) 150 MG 24 hr tablet Take 150 mg by mouth daily.     cetirizine (ZYRTEC ALLERGY) 10 MG tablet Take 1 tablet (10 mg total) by mouth 2 (two) times daily. 60 tablet 5    cyclobenzaprine (FLEXERIL) 10 MG tablet Take 10 mg by mouth as needed for muscle spasms.     dapagliflozin propanediol (FARXIGA) 10 MG TABS tablet Take 1 tablet (10 mg total) by mouth daily. 30 tablet 9   diphenoxylate-atropine (LOMOTIL) 2.5-0.025 MG tablet Take 2 tablets by mouth 4 (four) times daily as needed for diarrhea/loose stools. 60 tablet 3   famotidine (PEPCID) 20 MG tablet Take 1 tablet (20 mg total) by mouth 2 (two) times daily.  60 tablet 5   feeding supplement (ENSURE ENLIVE / ENSURE PLUS) LIQD Take 237 mLs by mouth 2 (two) times daily between meals. 237 mL 12   Fezolinetant (VEOZAH) 45 MG TABS Take 1 tablet (45 mg total) by mouth daily. 30 tablet 2   fluticasone (FLONASE) 50 MCG/ACT nasal spray Place 1-2 sprays into both nostrils daily as needed (nasal congestion). 16 g 5   furosemide (LASIX) 20 MG tablet Take 4 tablets (80 mg total) by mouth 2 (two) times daily. 240 tablet 3   glucose blood (ACCU-CHEK GUIDE) test strip Use as instructed to check blood sugar daily before breakfast 100 each 12   ivabradine (CORLANOR) 5 MG TABS tablet Take 1/2 tablet (2.5 mg total) by mouth 2 (two) times daily. 30 tablet 5   metolazone (ZAROXOLYN) 2.5 MG tablet Take 1 tablet (2.5 mg total) by mouth 2 (two) times a week. 8 tablet 1   metoprolol succinate (TOPROL-XL) 25 MG 24 hr tablet Take 1/2 tablet (12.5 mg total) by mouth daily. 45 tablet 1   metroNIDAZOLE (METROGEL) 1 % gel Apply 1 Application topically as needed (rash).     Multiple Vitamins-Minerals (MULTIVITAMIN WITH MINERALS) tablet Take 1 tablet by mouth daily.     oxymetazoline (AFRIN) 0.05 % nasal spray Place 1-2 sprays into both nostrils 2 (two) times daily as needed for congestion.     Pancrelipase, Lip-Prot-Amyl, (CREON PO) Take 3,600 Units by mouth in the morning, at noon, and at bedtime.     potassium chloride SA (KLOR-CON M) 20 MEQ tablet Take 6 tablets (120 mEq total) by mouth every morning AND 5 tablets (100 mEq total) daily at 12 noon  AND 6 tablets (120 mEq total) every evening. 510 tablet 3   prazosin (MINIPRESS) 2 MG capsule Take 2-6 mg by mouth daily as needed (for sleep).     promethazine (PHENERGAN) 25 MG tablet Take 1 tablet (25 mg total) by mouth every 6 (six) to 8 (eight) hours for nausea or vomiting. 90 tablet 3   sitaGLIPtin (JANUVIA) 50 MG tablet Take 1 tablet (50 mg total) by mouth daily. 90 tablet 1   spironolactone (ALDACTONE) 25 MG tablet Take 1 tablet (25 mg total) by mouth daily. 90 tablet 3   triamcinolone cream (KENALOG) 0.1 % Apply 1 Application topically as needed.     valACYclovir (VALTREX) 500 MG tablet Take 1 tablet (500 mg total) by mouth 2 (two) times daily. 6 tablet 2   vedolizumab (ENTYVIO) 300 MG injection Inject 300 mg into the vein every 2 (two) months.     zolpidem (AMBIEN) 10 MG tablet Take 5-10 mg by mouth at bedtime.     No current facility-administered medications for this encounter.   BP 92/60   Pulse 75   Ht 5\' 10"  (1.778 m)   Wt 86 kg (189 lb 9.6 oz)   SpO2 99%   BMI 27.20 kg/m   Wt Readings from Last 3 Encounters:  06/27/23 86 kg (189 lb 9.6 oz)  06/20/23 83 kg (183 lb)  06/19/23 82.6 kg (182 lb)   PHYSICAL EXAM: General:  Well appearing.  Neck: no JVD.  Cor: Regular rate & rhythm. No rubs, gallops or murmurs. Lungs: clear Abdomen: soft, nontender, nondistended.  Extremities: no edema Neuro: alert & orientedx3. Affect pleasant   [employer]os[employer]i ICD: HL index 1, activity level 1.2 hrs/day, no VT/VF  ReDS 36%  ASSESSMENT & PLAN:  1. 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 Echo  EF 20-25%, severely dilated LV, RV okay, moderate MR - R/LHC (10/22): 1v CAD occluded mid LAD PCWP 27,  Fick 4.7/2.  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 No viability. RV okay. Not sure how to explain inferior defects on cMRI  - Echo (05/11/21) EF < 20% RV ok - CPX  1/23 with very mild HF limitation:  Peak VO2: 18.8 (90% predicted peak VO2) VE/VCO2 slope: 33 Peak RER: 1.14  - s/p Bos[employer]I ICD 4/23. - Echo (4/24): EF 35-40%, RV ok - Echo (03/25): EF 30-35%, RV low normal - NYHA III. Volume okay on exam. HL index is low at 1. However, ReDS elevated at 36%. Her weight goes up 7-8 lb between doses of metolazone and notices less robust response with lasix. Switch lasix to Torsemide. Take 60 mg BID X 2 days, then reduce to 40 BID. Increase KCL to 120 mEq TID. Hopefully will be able to cut back use of metolazone, currently 2-3 X a week. - Continue Ivabradine 2.5 mg bid.  - Continue Toprol XL 12.5 mg daily. - Continue spiro 25 mg daily. Ideally would increase to help with hypoK, but no BP room today - Continue Farxiga 10 mg daily.  - Off losartan d/t low BP. BP too low currently to rechallenge. - CMET/BNP today - Arrange CPX next visit to better assess degree of HF limitation.   2. Palpitations - No longer on Adderall. - Zio 14-day (11/22) showed mostly SR, no high-grade arrhythmias. - Monitor 12/23 SR. Two brief runs SVT (4 beats)  - No events on device check  3. CAD - Single vessel LAD occlusion on LHC. - No s/s ischemia - Continue statin. - No ASA with Eliquis.   4. Tobacco use - Continues to refrain from cigarettes.  5. LV apical thrombus - Noted on echo/cMRI.  - None on most recent echo 4/24. - Continue Eliquis. No bleeding  6. Crohn's  - Following with GI.   7. OSA - Mild on sleep study, AHI 11.8 -On CPAP. -Planning to establish with Pulmonary for monitoring  8. Polycythemia - Follows with Heme/Onc. - JAK negative  9. AAA - CT scan 11/23  3.1cm - Follow u/s in 3 years recommended (02/2025)  10. Hypokalemia - Chronic, Crohn's likely contributing. - On high-dose KCL supplementation - Labs today  Follow-up 3 weeks to assess volume  Tyde Lamison N, PA-C  1:51 PM

## 2023-06-27 NOTE — Patient Instructions (Signed)
 Medication Changes:  STOP LASIX (FUROSEMIDE)   START: TORSEMIDE 60MG  TWICE DAILY FOR 2 DAYS THEN RETURN TO 40MG  TWICE DAILY THEREAFTER   INCREASE POTASSIUM TO THREE TIMES DAILY   Lab Work:  Labs done today, your results will be available in MyChart, we will contact you for abnormal readings.  THEN AGAIN IN 1 WEEK AS SCHEDULED   Follow-Up in: 2 MONTHS AS SCHEDULED   At the [facility], you and your health needs are our priority. We have a designated team specialized in the treatment of Heart Failure. This Care Team includes your primary Heart Failure Specialized Cardiologist (physician), Advanced Practice Providers (APPs- Physician Assistants and Nurse Practitioners), and Pharmacist who all work together to provide you with the care you need, when you need it.   You may see any of the following providers on your designated Care Team at your next follow up:  Dr. Arvilla Meres Dr. Marca Ancona Dr. Dorthula Nettles Dr. Theresia Bough Tonye Becket, NP Robbie Lis, [place] [facility] [place], [place] Brynda Peon, NP [place] Lee, NP Karle Plumber, PharmD   Please be sure to bring in all your medications bottles to every appointment.   Need to Contact us:  If you have any questions or concerns before your next appointment please send [place] a message through [place] or call our office at [phone].    TO LEAVE A MESSAGE FOR THE NURSE SELECT OPTION 2, PLEASE LEAVE A MESSAGE INCLUDING: YOUR NAME DATE OF BIRTH CALL BACK NUMBER REASON FOR CALL**this is important as we prioritize the call backs  YOU WILL RECEIVE A CALL BACK THE SAME DAY AS LONG AS YOU CALL BEFORE 4:00 PM

## 2023-06-27 NOTE — Telephone Encounter (Signed)
 Addressed in 05/22/23 telephone encounter.

## 2023-06-27 NOTE — Progress Notes (Signed)
 ReDS Vest / Clip - 06/27/23 1333       ReDS Vest / Clip   Station Marker C    Ruler Value 26    ReDS Value Range Moderate volume overload    ReDS Actual Value 36

## 2023-06-28 ENCOUNTER — Other Ambulatory Visit: Payer: Self-pay | Admitting: Internal Medicine

## 2023-06-28 ENCOUNTER — Other Ambulatory Visit (HOSPITAL_COMMUNITY): Payer: Self-pay

## 2023-06-28 ENCOUNTER — Other Ambulatory Visit: Payer: Self-pay

## 2023-06-28 DIAGNOSIS — E119 Type 2 diabetes mellitus without complications: Secondary | ICD-10-CM

## 2023-06-28 MED ORDER — GLUCOSE BLOOD VI STRP
ORAL_STRIP | Freq: Every day | 6 refills | Status: AC
  Filled 2023-06-28: qty 100, 90d supply, fill #0
  Filled 2023-07-31: qty 100, 90d supply, fill #1
  Filled 2023-08-12 – 2023-10-11 (×2): qty 100, 90d supply, fill #0
  Filled 2024-01-15: qty 100, 90d supply, fill #1
  Filled 2024-04-11: qty 100, 90d supply, fill #2

## 2023-07-01 ENCOUNTER — Other Ambulatory Visit (HOSPITAL_COMMUNITY): Payer: Self-pay

## 2023-07-02 ENCOUNTER — Encounter (HOSPITAL_COMMUNITY): Payer: Self-pay | Admitting: Internal Medicine

## 2023-07-03 ENCOUNTER — Encounter: Payer: Self-pay | Admitting: Podiatry

## 2023-07-03 ENCOUNTER — Ambulatory Visit (INDEPENDENT_AMBULATORY_CARE_PROVIDER_SITE_OTHER): Admitting: Podiatry

## 2023-07-03 DIAGNOSIS — L600 Ingrowing nail: Secondary | ICD-10-CM

## 2023-07-03 NOTE — Patient Instructions (Signed)

## 2023-07-04 ENCOUNTER — Ambulatory Visit (HOSPITAL_COMMUNITY)
Admission: RE | Admit: 2023-07-04 | Discharge: 2023-07-04 | Disposition: A | Discharge status: Home / Self Care | Source: Ambulatory Visit | Attending: Cardiology | Admitting: Cardiology

## 2023-07-04 ENCOUNTER — Other Ambulatory Visit (HOSPITAL_COMMUNITY): Payer: Self-pay

## 2023-07-04 DIAGNOSIS — I5020 Unspecified systolic (congestive) heart failure: Secondary | ICD-10-CM | POA: Insufficient documentation

## 2023-07-04 LAB — BASIC METABOLIC PANEL
Anion gap: 10 (ref 5–15)
BUN: 32 mg/dL — ABNORMAL HIGH (ref 6–20)
CO2: 25 mmol/L (ref 22–32)
Calcium: 9.6 mg/dL (ref 8.9–10.3)
Chloride: 98 mmol/L (ref 98–111)
Creatinine, Ser: 1.4 mg/dL — ABNORMAL HIGH (ref 0.44–1.00)
GFR, Estimated: 44 mL/min — ABNORMAL LOW (ref >60)
Glucose, Bld: 121 mg/dL — ABNORMAL HIGH (ref 70–99)
Potassium: 5.2 mmol/L — ABNORMAL HIGH (ref 3.5–5.1)
Sodium: 133 mmol/L — ABNORMAL LOW (ref 135–145)

## 2023-07-04 NOTE — Progress Notes (Signed)
 Subjective:   Patient ID: Christine [name], female   DOB: 58 y.o.   MRN: [id number]   HPI Patient presents stating getting a lot of pain in the left big toenail lateral border and it has been sore and hard for her to work on   ROS      Objective:  Physical Exam  Vascular status intact with incurvated lateral border left big toe very painful localized no drainage noted mild erythema around the area     Assessment:  Ingrown toenail deformity left hallux lateral border with pain     Plan:  H&P reviewed condition and I have recommended removal of the border permanent and explained procedure risk.  Patient wants surgery and signed consent form understanding risk and today I infiltrated the left big toe 60 mg like Marcaine mixture sterile prep done and using sterile instrumentation remove the lateral border exposed matrix applied phenol 3 applications 30 seconds followed by alcohol lavage sterile dressing gave instructions on soaks and wear dressing 24 hours taken it off earlier if throbbing were to occur.  Patient encouraged to call with questions which might arise during recovery

## 2023-07-04 NOTE — Telephone Encounter (Signed)
 If cannot tolerate torsemide can switch back to lasix. Depending on today's labs results we can increase the dose of lasix and may need to increase K supplement as well.

## 2023-07-05 ENCOUNTER — Telehealth (HOSPITAL_COMMUNITY): Payer: Self-pay | Admitting: Cardiology

## 2023-07-05 NOTE — Telephone Encounter (Signed)
 Will confirm eliquis dose with provider on 3/24  Per Dr Gala Romney on 07/08/23- hold day before and day of for eliquis   Pt aware of instructions below and copy sent via mychart  -during conversation pt report she put herself back on lasix in hopes of decreasing weight/symptom management, will discuss further with provider at OV 3/25, will need new RX  [facility] [address] [place] [address] Dept: [phone] Loc: [phone]  Christine [name]  07/05/2023  You are scheduled for a Cardiac Catheterization on Friday, March 28 with Dr. Arvilla Meres.  1. Please arrive at the [facility] (Main Entrance A) at [facility]: [address] [place], [place] [address] at 6:30 AM (This time is 1 hour(s) before your procedure to ensure your preparation).   Free valet parking service is available. You will check in at ADMITTING. The support person will be asked to wait in the waiting room.  It is OK to have someone drop you off and come back when you are ready to be discharged.    Special note: Every effort is made to have your procedure done on time. Please understand that emergencies sometimes delay scheduled procedures.  2. Diet: Do not eat solid foods after midnight.  The patient may have clear liquids until 5am upon the day of the procedure.  3. Labs: Pre procedure labs done 07/05/2023  4. Medication instructions in preparation for your procedure:   Contrast Allergy: No    Stop taking Eliquis (Apixiban) on Thursday, March 27.  Stop taking Furosemide/Torsemide  on Friday, March 28    On the morning of your procedure, take any morning medicines NOT listed above.  You may use sips of water.  5. Plan to go home the same day, you will only stay overnight if medically necessary. 6. Bring a current list of your medications and current insurance cards. 7. You MUST have a responsible person to drive  you home. 8. Someone MUST be with you the first 24 hours after you arrive home or your discharge will be delayed. 9. Please wear clothes that are easy to get on and off and wear slip-on shoes.  Thank you for allowing [place] to care for you!   -- [facility]

## 2023-07-05 NOTE — Telephone Encounter (Signed)
 No change to diuretics after review of remote transmission.   Okay to arrange RHC.

## 2023-07-05 NOTE — Telephone Encounter (Signed)
 Received [facility] report for HF clinic to review.  Heartlogic HF index 1 and thoracic impedance trending high suggesting fluid levels within normal threshold.

## 2023-07-05 NOTE — Telephone Encounter (Signed)
 Per Alferd Apa Patient should be taking lasix 80 BID Metolazone twice a week And KCL 100 TID,based on 3/21 results  -confirm metolazone usage this week -have patient send device transmission   -pt reports she took metolazone Sunday and Today -will send transmission -ok to have RHC done next week if needed

## 2023-07-08 ENCOUNTER — Telehealth (HOSPITAL_COMMUNITY): Payer: Self-pay

## 2023-07-08 NOTE — Telephone Encounter (Signed)
 Called to confirm/remind patient of their appointment at the [facility] on 07/09/2023 2:30.   Appointment:   [x] Confirmed  [] Left mess   [] No answer/No voice mail  [] Phone not in service  Patient reminded to bring all medications and/or complete list.  Confirmed patient has transportation. Gave directions, instructed to utilize valet parking.

## 2023-07-08 NOTE — H&P (View-Only) (Signed)
 ADVANCED HF CLINIC NOTE   PCP: Marcine Matar, MD Cardiology: Dr. Duke Salvia HF Cardiologist: Dr. Gala Romney  Chief Complaint: Shortness of breath  HPI: Christine [name] is a 59 y.o. female with history of chronic tobacco use, ADHD, anxiety, depression, CAD, systolic HF   Presented to ED 16/1/09 with increased shortness of breath/tachycardia. Adderrall stopped. Echo with EF 20-25%,  LHC/RHC with single vessel CAD (occluded mLAD) elevated filling pressures and  moderately reduced CO. She did not undergo PCI as cMRI demonstrated nonviable myocardium in the infarct territory. CAD managed medically and she was placed on HF GDMT. ? blocker discontinued w/ reduced output.     EF did not improve. Repeat echo 05/11/21 EF < 20% LV severely dilated RV ok Mild MR. Referred for ICD.   CPX 05/2021 with very mild HF limitation with elevated Ve/VCO2 slope  S/p [employer] ICD 4/23.   Monitor in 12/23: Rare PVCs 2 episodes of brief SVT (4 beats)   Echo (4/24) showed improved EF 35-40%, garde 1 DD, RV ok  Covid + 12/15/22. Completed Molnupiravir.   Recently, she has struggled w/ needed adjustment of diuretics. Reports home wts have been up and down w/ recent use of PRN metolazone. She reports recent increase in SOB and fatigue w/ exertion. Symptoms do not always correlate to and often out of proportion to objective data including clinic ReDs readings, normal BNP values and thoracic impedence on device interrogations. Echo was recently obtained 3/25, EF 30-35%, RV okay, no significant valvular dysfunction nor pericardial effusion. IVC was also not dilated. Assumed RAP ~3 mmHg. She has subsequently been scheduled for RHC to better assess filling pressures and cardiac output. This is scheduled for later this wk on 3/28.   She presents today for f/u. She continues to endorse exertional dyspnea and wt gain. Says her wt is up 9 lb from baseline. Objective data suggest euvolemia. ReDs 31%. HL score 1.    ROS:  All systems negative except as listed in HPI, PMH and Problem List.  SH:  Social History   Socioeconomic History   Marital status: Single    Spouse name: Not on file   Number of children: 0   Years of education: 16   Highest education level: Bachelor's degree (e.g., BA, AB, BS)  Occupational History   Not on file  Tobacco Use   Smoking status: Former    Current packs/day: 0.00    Types: Cigarettes    Quit date: 01/15/2021    Years since quitting: 2.4    Passive exposure: Past   Smokeless tobacco: Never  Vaping Use   Vaping status: Never Used  Substance and Sexual Activity   Alcohol use: Not Currently   Drug use: Never   Sexual activity: Not on file  Other Topics Concern   Not on file  Social History Narrative   Not on file   Social Drivers of Health   Financial Resource Strain: Medium Risk (05/16/2023)   Overall Financial Resource Strain (CARDIA)    Difficulty of Paying Living Expenses: Somewhat hard  Food Insecurity: No Food Insecurity (05/16/2023)   Hunger Vital Sign    Worried About Running Out of Food in the Last Year: Never true    Ran Out of Food in the Last Year: Never true  Transportation Needs: No Transportation Needs (05/16/2023)   PRAPARE - Administrator, Civil Service (Medical): No    Lack of Transportation (Non-Medical): No  Physical Activity: Insufficiently Active (05/16/2023)  Exercise Vital Sign    Days of Exercise per Week: 4 days    Minutes of Exercise per Session: 20 min  Stress: Stress Concern Present (05/16/2023)   [employer] of Occupational Health - Occupational Stress Questionnaire    Feeling of Stress : Very much  Social Connections: Moderately Isolated (05/16/2023)   Social Connection and Isolation Panel [NHANES]    Frequency of Communication with Friends and Family: More than three times a week    Frequency of Social Gatherings with Friends and Family: More than three times a week    Attends Religious Services: 1 to 4  times per year    Active Member of [employer] or Organizations: No    Attends Engineer, structural: Not on file    Marital Status: Never married  Intimate Partner Violence: Unknown (07/21/2021)   Received from [employer], [facility]   HITS    Physically Hurt: Not on file    Insult or Talk Down To: Not on file    Threaten Physical Harm: Not on file    Scream or Curse: Not on file   FH:  Family History  Problem Relation Age of Onset   Eczema Mother    Stroke Mother    Atrial fibrillation Mother    Heart failure Father    Asthma Brother    Stomach cancer Maternal Grandfather    Liver cancer Neg Hx    Esophageal cancer Neg Hx    Colon polyps Neg Hx    Colon cancer Neg Hx    Allergic rhinitis Neg Hx    Angioedema Neg Hx    Urticaria Neg Hx    Past Medical History:  Diagnosis Date   Allergy    Anxiety 01/20/2021   CAD in native artery 08/24/2021   CHF (congestive heart failure) (HCC)    Chronic combined systolic and diastolic heart failure (HCC) 08/24/2021   Complication of anesthesia    Crohn's colitis (HCC)    Depression    Hyperlipidemia    Myocardial infarction (HCC)    Presence of cardiac defibrillator 07/2021   Shortness of breath 01/20/2021   Sleep apnea 07/2021   Tobacco abuse 01/20/2021   Urticaria    Current Outpatient Medications  Medication Sig Dispense Refill   Accu-Chek Softclix Lancets lancets Use as instructed.  Check blood sugar daily before breakfast. 100 each 12   acetaminophen (TYLENOL) 500 MG tablet Take 1,000 mg by mouth 3 (three) times daily as needed for moderate pain or headache.     ALPRAZolam (XANAX) 1 MG tablet Take 2 mg by mouth 3 (three) times daily.     alum & mag hydroxide-simeth (MAALOX/MYLANTA) 200-200-20 MG/5ML suspension Take 30 mLs by mouth every 4 (four) hours as needed for indigestion or heartburn. 355 mL 0   apixaban (ELIQUIS) 5 MG TABS tablet Take 1 tablet (5 mg total) by mouth 2 (two) times daily. 60 tablet 11    atorvastatin (LIPITOR) 80 MG tablet Take 1 tablet (80 mg total) by mouth daily. 90 tablet 3   Blood Glucose Monitoring Suppl (ACCU-CHEK GUIDE) w/Device KIT Check blood sugar once daily before breakfast 1 kit 0   buPROPion (WELLBUTRIN XL) 150 MG 24 hr tablet Take 150 mg by mouth daily.     cetirizine (ZYRTEC ALLERGY) 10 MG tablet Take 1 tablet (10 mg total) by mouth 2 (two) times daily. 60 tablet 5   cyclobenzaprine (FLEXERIL) 10 MG tablet Take 10 mg by mouth as needed for muscle spasms.  dapagliflozin propanediol (FARXIGA) 10 MG TABS tablet Take 1 tablet (10 mg total) by mouth daily. 30 tablet 9   diphenoxylate-atropine (LOMOTIL) 2.5-0.025 MG tablet Take 2 tablets by mouth 4 (four) times daily as needed for diarrhea/loose stools. 60 tablet 3   famotidine (PEPCID) 20 MG tablet Take 1 tablet (20 mg total) by mouth 2 (two) times daily. 60 tablet 5   feeding supplement (ENSURE ENLIVE / ENSURE PLUS) LIQD Take 237 mLs by mouth 2 (two) times daily between meals. 237 mL 12   Fezolinetant (VEOZAH) 45 MG TABS Take 1 tablet (45 mg total) by mouth daily. 30 tablet 2   fluticasone (FLONASE) 50 MCG/ACT nasal spray Place 1-2 sprays into both nostrils daily as needed (nasal congestion). 16 g 5   glucose blood test strip Use as instructed to check blood sugar daily before breakfast 100 each 6   ivabradine (CORLANOR) 5 MG TABS tablet Take 1/2 tablet (2.5 mg total) by mouth 2 (two) times daily. 30 tablet 5   metolazone (ZAROXOLYN) 2.5 MG tablet Take 1 tablet (2.5 mg total) by mouth 2 (two) times a week. 8 tablet 1   metoprolol succinate (TOPROL-XL) 25 MG 24 hr tablet Take 1/2 tablet (12.5 mg total) by mouth daily. 45 tablet 1   metroNIDAZOLE (METROGEL) 1 % gel Apply 1 Application topically as needed (rash).     Multiple Vitamins-Minerals (MULTIVITAMIN WITH MINERALS) tablet Take 1 tablet by mouth daily.     oxymetazoline (AFRIN) 0.05 % nasal spray Place 1-2 sprays into both nostrils 2 (two) times daily as needed for  congestion.     Pancrelipase, Lip-Prot-Amyl, (CREON PO) Take 3,600 Units by mouth in the morning, at noon, and at bedtime.     potassium chloride SA (KLOR-CON M) 20 MEQ tablet Take 6 tablets (120 mEq total) by mouth 3 (three) times daily. 540 tablet 5   prazosin (MINIPRESS) 2 MG capsule Take 2-6 mg by mouth daily as needed (for sleep).     promethazine (PHENERGAN) 25 MG tablet Take 1 tablet (25 mg total) by mouth every 6 (six) to 8 (eight) hours for nausea or vomiting. 90 tablet 3   sitaGLIPtin (JANUVIA) 50 MG tablet Take 1 tablet (50 mg total) by mouth daily. 90 tablet 1   spironolactone (ALDACTONE) 25 MG tablet Take 1 tablet (25 mg total) by mouth daily. 90 tablet 3   torsemide (DEMADEX) 20 MG tablet Take 2 tablets (40 mg total) by mouth 2 (two) times daily. 180 tablet 3   triamcinolone cream (KENALOG) 0.1 % Apply 1 Application topically as needed.     valACYclovir (VALTREX) 500 MG tablet Take 1 tablet (500 mg total) by mouth 2 (two) times daily. 6 tablet 2   vedolizumab (ENTYVIO) 300 MG injection Inject 300 mg into the vein every 2 (two) months.     zolpidem (AMBIEN) 10 MG tablet Take 5-10 mg by mouth at bedtime.     No current facility-administered medications for this visit.   There were no vitals taken for this visit.  Wt Readings from Last 3 Encounters:  06/27/23 86 kg (189 lb 9.6 oz)  06/20/23 83 kg (183 lb)  06/19/23 82.6 kg (182 lb)   PHYSICAL EXAM: General:  Well appearing. No respiratory difficulty HEENT: normal Neck: supple. no JVD. Carotids 2+ bilat; no bruits. No lymphadenopathy or thyromegaly appreciated. Cor: PMI nondisplaced. Regular rate & rhythm. No rubs, gallops or murmurs. Lungs: clear Abdomen: soft, nontender, nondistended. No hepatosplenomegaly. No bruits or masses. Good bowel sounds.  Extremities: no cyanosis, clubbing, rash, edema Neuro: alert & oriented x 3, cranial nerves grossly intact. moves all 4 extremities w/o difficulty. Affect pleasant.   Bos[employer]i ICD:  HL index 1, activity level 1.7 hrs/day, no VT/VF  ReDS 31%, normal   ASSESSMENT & PLAN:  1. 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 Echo EF 20-25%, severely dilated LV, RV okay, moderate MR - R/LHC (10/22): 1v CAD occluded mid LAD PCWP 27,  Fick 4.7/2.  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 No viability. RV okay. Not sure how to explain inferior defects on cMRI  - Echo (05/11/21) EF < 20% RV ok - CPX 1/23 with very mild HF limitation:  Peak VO2: 18.8 (90% predicted peak VO2) VE/VCO2 slope: 33 Peak RER: 1.14  - s/p [employer] ICD 4/23. - Echo (4/24): EF 35-40%, RV ok - Echo (03/25): EF 30-35%, RV low normal - NYHA II-III - Euvolemic on exam and by ReDs (31%, normal) and on device interrogation. HL Score 1.  - Plan RHC later this week to definitively assess volume status and cardiac output - Continue Ivabradine 2.5 mg bid.  - Continue Toprol XL 12.5 mg daily. - Continue spiro 25 mg daily.  - Continue Farxiga 10 mg daily.  - Off losartan d/t low BP. BP too low currently to rechallenge. - Continue Lasix 80 mg bid  - Check BMP and BNP today   2. CAD - Single vessel LAD occlusion on LHC. - denies CP  - Continue statin. - No ASA with Eliquis use   3. LV apical thrombus - Noted on echo/cMRI.  - None on most recent echo 4/24. - Continue Eliquis. No bleeding  4. OSA - Mild on sleep study, AHI 11.8 -On CPAP. -Planning to establish with Pulmonary for monitoring  5. Polycythemia - Follows with Heme/Onc. - JAK negative  9. AAA - CT scan 11/23  3.1cm - Follow u/s in 3 years recommended (02/2025)  10. Dyspnea - I don't think 2/2 volume overload, plan RHC to check CO. If cath unremarkable, consider referral to pulmonology  - check CBC and iron panel  - doubt PE, compliant w/ Eliquis   11. Wt gain - as noted above, appears euvolemic - check TSH  -  may be caloric   Keep scheduled f/u in May. Will bring back sooner post cath if remarkable findings that require med management.   Robbie Lis, PA-C  11:39 AM

## 2023-07-08 NOTE — Progress Notes (Signed)
 ADVANCED HF CLINIC NOTE   PCP: Marcine Matar, MD Cardiology: Dr. Duke Salvia HF Cardiologist: Dr. Gala Romney  Chief Complaint: Shortness of breath  HPI: Christine [name] is a 58 y.o. female with history of chronic tobacco use, ADHD, anxiety, depression, CAD, systolic HF   Presented to ED [birth year] with increased shortness of breath/tachycardia. Adderrall stopped. Echo with EF 20-25%,  LHC/RHC with single vessel CAD (occluded mLAD) elevated filling pressures and  moderately reduced CO. She did not undergo PCI as cMRI demonstrated nonviable myocardium in the infarct territory. CAD managed medically and she was placed on HF GDMT. ? blocker discontinued w/ reduced output.     EF did not improve. Repeat echo 05/11/21 EF < 20% LV severely dilated RV ok Mild MR. Referred for ICD.   CPX 05/2021 with very mild HF limitation with elevated Ve/VCO2 slope  S/p [employer] ICD 4/23.   Monitor in 12/23: Rare PVCs 2 episodes of brief SVT (4 beats)   Echo (4/24) showed improved EF 35-40%, garde 1 DD, RV ok  Covid + 12/15/22. Completed Molnupiravir.   Recently, she has struggled w/ needed adjustment of diuretics. Reports home wts have been up and down w/ recent use of PRN metolazone. She reports recent increase in SOB and fatigue w/ exertion. Symptoms do not always correlate to and often out of proportion to objective data including clinic ReDs readings, normal BNP values and thoracic impedence on device interrogations. Echo was recently obtained 3/25, EF 30-35%, RV okay, no significant valvular dysfunction nor pericardial effusion. IVC was also not dilated. Assumed RAP ~3 mmHg. She has subsequently been scheduled for RHC to better assess filling pressures and cardiac output. This is scheduled for later this wk on 3/28.   She presents today for f/u. She continues to endorse exertional dyspnea and wt gain. Says her wt is up 9 lb from baseline. Objective data suggest euvolemia. ReDs 31%. HL score 1.    ROS:  All systems negative except as listed in HPI, PMH and Problem List.  SH:  Social History   Socioeconomic History   Marital status: Single    Spouse name: Not on file   Number of children: 0   Years of education: 16   Highest education level: Bachelor's degree (e.g., BA, AB, BS)  Occupational History   Not on file  Tobacco Use   Smoking status: Former    Current packs/day: 0.00    Types: Cigarettes    Quit date: 01/15/2021    Years since quitting: 2.4    Passive exposure: Past   Smokeless tobacco: Never  Vaping Use   Vaping status: Never Used  Substance and Sexual Activity   Alcohol use: Not Currently   Drug use: Never   Sexual activity: Not on file  Other Topics Concern   Not on file  Social History Narrative   Not on file   Social Drivers of Health   Financial Resource Strain: Medium Risk (05/16/2023)   Overall Financial Resource Strain (CARDIA)    Difficulty of Paying Living Expenses: Somewhat hard  Food Insecurity: No Food Insecurity (05/16/2023)   Hunger Vital Sign    Worried About Running Out of Food in the Last Year: Never true    Ran Out of Food in the Last Year: Never true  Transportation Needs: No Transportation Needs (05/16/2023)   PRAPARE - Administrator, Civil Service (Medical): No    Lack of Transportation (Non-Medical): No  Physical Activity: Insufficiently Active (05/16/2023)  Exercise Vital Sign    Days of Exercise per Week: 4 days    Minutes of Exercise per Session: 20 min  Stress: Stress Concern Present (05/16/2023)   [employer] of Occupational Health - Occupational Stress Questionnaire    Feeling of Stress : Very much  Social Connections: Moderately Isolated (05/16/2023)   Social Connection and Isolation Panel [NHANES]    Frequency of Communication with Friends and Family: More than three times a week    Frequency of Social Gatherings with Friends and Family: More than three times a week    Attends Religious Services: 1 to 4  times per year    Active Member of [employer] or Organizations: No    Attends Engineer, structural: Not on file    Marital Status: Never married  Intimate Partner Violence: Unknown (07/21/2021)   Received from [facility], [facility]   HITS    Physically Hurt: Not on file    Insult or Talk Down To: Not on file    Threaten Physical Harm: Not on file    Scream or Curse: Not on file   FH:  Family History  Problem Relation Age of Onset   Eczema Mother    Stroke Mother    Atrial fibrillation Mother    Heart failure Father    Asthma Brother    Stomach cancer Maternal Grandfather    Liver cancer Neg Hx    Esophageal cancer Neg Hx    Colon polyps Neg Hx    Colon cancer Neg Hx    Allergic rhinitis Neg Hx    Angioedema Neg Hx    Urticaria Neg Hx    Past Medical History:  Diagnosis Date   Allergy    Anxiety 01/20/2021   CAD in native artery 08/24/2021   CHF (congestive heart failure) (HCC)    Chronic combined systolic and diastolic heart failure (HCC) 08/24/2021   Complication of anesthesia    Crohn's colitis (HCC)    Depression    Hyperlipidemia    Myocardial infarction (HCC)    Presence of cardiac defibrillator 07/2021   Shortness of breath 01/20/2021   Sleep apnea 07/2021   Tobacco abuse 01/20/2021   Urticaria    Current Outpatient Medications  Medication Sig Dispense Refill   Accu-Chek Softclix Lancets lancets Use as instructed.  Check blood sugar daily before breakfast. 100 each 12   acetaminophen (TYLENOL) 500 MG tablet Take 1,000 mg by mouth 3 (three) times daily as needed for moderate pain or headache.     ALPRAZolam (XANAX) 1 MG tablet Take 2 mg by mouth 3 (three) times daily.     alum & mag hydroxide-simeth (MAALOX/MYLANTA) 200-200-20 MG/5ML suspension Take 30 mLs by mouth every 4 (four) hours as needed for indigestion or heartburn. 355 mL 0   apixaban (ELIQUIS) 5 MG TABS tablet Take 1 tablet (5 mg total) by mouth 2 (two) times daily. 60 tablet 11    atorvastatin (LIPITOR) 80 MG tablet Take 1 tablet (80 mg total) by mouth daily. 90 tablet 3   Blood Glucose Monitoring Suppl (ACCU-CHEK GUIDE) w/Device KIT Check blood sugar once daily before breakfast 1 kit 0   buPROPion (WELLBUTRIN XL) 150 MG 24 hr tablet Take 150 mg by mouth daily.     cetirizine (ZYRTEC ALLERGY) 10 MG tablet Take 1 tablet (10 mg total) by mouth 2 (two) times daily. 60 tablet 5   cyclobenzaprine (FLEXERIL) 10 MG tablet Take 10 mg by mouth as needed for muscle spasms.  dapagliflozin propanediol (FARXIGA) 10 MG TABS tablet Take 1 tablet (10 mg total) by mouth daily. 30 tablet 9   diphenoxylate-atropine (LOMOTIL) 2.5-0.025 MG tablet Take 2 tablets by mouth 4 (four) times daily as needed for diarrhea/loose stools. 60 tablet 3   famotidine (PEPCID) 20 MG tablet Take 1 tablet (20 mg total) by mouth 2 (two) times daily. 60 tablet 5   feeding supplement (ENSURE ENLIVE / ENSURE PLUS) LIQD Take 237 mLs by mouth 2 (two) times daily between meals. 237 mL 12   Fezolinetant (VEOZAH) 45 MG TABS Take 1 tablet (45 mg total) by mouth daily. 30 tablet 2   fluticasone (FLONASE) 50 MCG/ACT nasal spray Place 1-2 sprays into both nostrils daily as needed (nasal congestion). 16 g 5   glucose blood test strip Use as instructed to check blood sugar daily before breakfast 100 each 6   ivabradine (CORLANOR) 5 MG TABS tablet Take 1/2 tablet (2.5 mg total) by mouth 2 (two) times daily. 30 tablet 5   metolazone (ZAROXOLYN) 2.5 MG tablet Take 1 tablet (2.5 mg total) by mouth 2 (two) times a week. 8 tablet 1   metoprolol succinate (TOPROL-XL) 25 MG 24 hr tablet Take 1/2 tablet (12.5 mg total) by mouth daily. 45 tablet 1   metroNIDAZOLE (METROGEL) 1 % gel Apply 1 Application topically as needed (rash).     Multiple Vitamins-Minerals (MULTIVITAMIN WITH MINERALS) tablet Take 1 tablet by mouth daily.     oxymetazoline (AFRIN) 0.05 % nasal spray Place 1-2 sprays into both nostrils 2 (two) times daily as needed for  congestion.     Pancrelipase, Lip-Prot-Amyl, (CREON PO) Take 3,600 Units by mouth in the morning, at noon, and at bedtime.     potassium chloride SA (KLOR-CON M) 20 MEQ tablet Take 6 tablets (120 mEq total) by mouth 3 (three) times daily. 540 tablet 5   prazosin (MINIPRESS) 2 MG capsule Take 2-6 mg by mouth daily as needed (for sleep).     promethazine (PHENERGAN) 25 MG tablet Take 1 tablet (25 mg total) by mouth every 6 (six) to 8 (eight) hours for nausea or vomiting. 90 tablet 3   sitaGLIPtin (JANUVIA) 50 MG tablet Take 1 tablet (50 mg total) by mouth daily. 90 tablet 1   spironolactone (ALDACTONE) 25 MG tablet Take 1 tablet (25 mg total) by mouth daily. 90 tablet 3   torsemide (DEMADEX) 20 MG tablet Take 2 tablets (40 mg total) by mouth 2 (two) times daily. 180 tablet 3   triamcinolone cream (KENALOG) 0.1 % Apply 1 Application topically as needed.     valACYclovir (VALTREX) 500 MG tablet Take 1 tablet (500 mg total) by mouth 2 (two) times daily. 6 tablet 2   vedolizumab (ENTYVIO) 300 MG injection Inject 300 mg into the vein every 2 (two) months.     zolpidem (AMBIEN) 10 MG tablet Take 5-10 mg by mouth at bedtime.     No current facility-administered medications for this visit.   There were no vitals taken for this visit.  Wt Readings from Last 3 Encounters:  06/27/23 86 kg (189 lb 9.6 oz)  06/20/23 83 kg (183 lb)  06/19/23 82.6 kg (182 lb)   PHYSICAL EXAM: General:  Well appearing. No respiratory difficulty HEENT: normal Neck: supple. no JVD. Carotids 2+ bilat; no bruits. No lymphadenopathy or thyromegaly appreciated. Cor: PMI nondisplaced. Regular rate & rhythm. No rubs, gallops or murmurs. Lungs: clear Abdomen: soft, nontender, nondistended. No hepatosplenomegaly. No bruits or masses. Good bowel sounds.  Extremities: no cyanosis, clubbing, rash, edema Neuro: alert & oriented x 3, cranial nerves grossly intact. moves all 4 extremities w/o difficulty. Affect pleasant.   [employer]os[employer]i ICD:  HL index 1, activity level 1.7 hrs/day, no VT/VF  ReDS 31%, normal   ASSESSMENT & PLAN:  1. 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 Echo EF 20-25%, severely dilated LV, RV okay, moderate MR - R/LHC (10/22): 1v CAD occluded mid LAD PCWP 27,  Fick 4.7/2.  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 No viability. RV okay. Not sure how to explain inferior defects on cMRI  - Echo (05/11/21) EF < 20% RV ok - CPX 1/23 with very mild HF limitation:  Peak VO2: 18.8 (90% predicted peak VO2) VE/VCO2 slope: 33 Peak RER: 1.14  - s/p Bos[employer]I ICD 4/23. - Echo (4/24): EF 35-40%, RV ok - Echo (03/25): EF 30-35%, RV low normal - NYHA II-III - Euvolemic on exam and by ReDs (31%, normal) and on device interrogation. HL Score 1.  - Plan RHC later this week to definitively assess volume status and cardiac output - Continue Ivabradine 2.5 mg bid.  - Continue Toprol XL 12.5 mg daily. - Continue spiro 25 mg daily.  - Continue Farxiga 10 mg daily.  - Off losartan d/t low BP. BP too low currently to rechallenge. - Continue Lasix 80 mg bid  - Check BMP and BNP today   2. CAD - Single vessel LAD occlusion on LHC. - denies CP  - Continue statin. - No ASA with Eliquis use   3. LV apical thrombus - Noted on echo/cMRI.  - None on most recent echo 4/24. - Continue Eliquis. No bleeding  4. OSA - Mild on sleep study, AHI 11.8 -On CPAP. -Planning to establish with Pulmonary for monitoring  5. Polycythemia - Follows with Heme/Onc. - JAK negative  9. AAA - CT scan 11/23  3.1cm - Follow u/s in 3 years recommended (02/2025)  10. Dyspnea - I don't think 2/2 volume overload, plan RHC to check CO. If cath unremarkable, consider referral to pulmonology  - check CBC and iron panel  - doubt PE, compliant w/ Eliquis   11. Wt gain - as noted above, appears euvolemic - check TSH  -  may be caloric   Keep scheduled f/u in May. Will bring back sooner post cath if remarkable findings that require med management.   Robbie Lis, PA-C  11:39 AM

## 2023-07-09 ENCOUNTER — Other Ambulatory Visit (HOSPITAL_COMMUNITY): Payer: Self-pay

## 2023-07-09 ENCOUNTER — Ambulatory Visit (HOSPITAL_COMMUNITY)
Admission: RE | Admit: 2023-07-09 | Discharge: 2023-07-09 | Disposition: A | Discharge status: Home / Self Care | Source: Ambulatory Visit | Attending: Cardiology | Admitting: Cardiology

## 2023-07-09 ENCOUNTER — Encounter (HOSPITAL_COMMUNITY): Payer: Self-pay

## 2023-07-09 ENCOUNTER — Encounter (HOSPITAL_COMMUNITY): Payer: Self-pay | Admitting: Cardiology

## 2023-07-09 VITALS — BP 98/64 | HR 95 | Ht 70.0 in | Wt 188.0 lb

## 2023-07-09 DIAGNOSIS — R0600 Dyspnea, unspecified: Secondary | ICD-10-CM

## 2023-07-09 DIAGNOSIS — R0609 Other forms of dyspnea: Secondary | ICD-10-CM | POA: Insufficient documentation

## 2023-07-09 DIAGNOSIS — Z87891 Personal history of nicotine dependence: Secondary | ICD-10-CM | POA: Insufficient documentation

## 2023-07-09 DIAGNOSIS — F32A Depression, unspecified: Secondary | ICD-10-CM | POA: Diagnosis not present

## 2023-07-09 DIAGNOSIS — Z79899 Other long term (current) drug therapy: Secondary | ICD-10-CM | POA: Insufficient documentation

## 2023-07-09 DIAGNOSIS — I2510 Atherosclerotic heart disease of native coronary artery without angina pectoris: Secondary | ICD-10-CM | POA: Diagnosis not present

## 2023-07-09 DIAGNOSIS — I5042 Chronic combined systolic (congestive) and diastolic (congestive) heart failure: Secondary | ICD-10-CM | POA: Diagnosis not present

## 2023-07-09 DIAGNOSIS — Z7901 Long term (current) use of anticoagulants: Secondary | ICD-10-CM | POA: Insufficient documentation

## 2023-07-09 DIAGNOSIS — Z8616 Personal history of COVID-19: Secondary | ICD-10-CM | POA: Diagnosis not present

## 2023-07-09 DIAGNOSIS — G4733 Obstructive sleep apnea (adult) (pediatric): Secondary | ICD-10-CM | POA: Insufficient documentation

## 2023-07-09 DIAGNOSIS — Z95810 Presence of automatic (implantable) cardiac defibrillator: Secondary | ICD-10-CM | POA: Diagnosis not present

## 2023-07-09 DIAGNOSIS — I714 Abdominal aortic aneurysm, without rupture, unspecified: Secondary | ICD-10-CM | POA: Diagnosis not present

## 2023-07-09 DIAGNOSIS — R0602 Shortness of breath: Secondary | ICD-10-CM | POA: Insufficient documentation

## 2023-07-09 DIAGNOSIS — F419 Anxiety disorder, unspecified: Secondary | ICD-10-CM | POA: Insufficient documentation

## 2023-07-09 DIAGNOSIS — Z86718 Personal history of other venous thrombosis and embolism: Secondary | ICD-10-CM | POA: Diagnosis not present

## 2023-07-09 DIAGNOSIS — D751 Secondary polycythemia: Secondary | ICD-10-CM | POA: Insufficient documentation

## 2023-07-09 LAB — FERRITIN: Ferritin: 138 ng/mL (ref 11–307)

## 2023-07-09 LAB — CBC
HCT: 45.4 % (ref 36.0–46.0)
Hemoglobin: 15.6 g/dL — ABNORMAL HIGH (ref 12.0–15.0)
MCH: 28.6 pg (ref 26.0–34.0)
MCHC: 34.4 g/dL (ref 30.0–36.0)
MCV: 83.3 fL (ref 80.0–100.0)
Platelets: 422 10*3/uL — ABNORMAL HIGH (ref 150–400)
RBC: 5.45 MIL/uL — ABNORMAL HIGH (ref 3.87–5.11)
RDW: 13.3 % (ref 11.5–15.5)
WBC: 9.7 10*3/uL (ref 4.0–10.5)
nRBC: 0 % (ref 0.0–0.2)

## 2023-07-09 LAB — BASIC METABOLIC PANEL
Anion gap: 13 (ref 5–15)
BUN: 32 mg/dL — ABNORMAL HIGH (ref 6–20)
CO2: 33 mmol/L — ABNORMAL HIGH (ref 22–32)
Calcium: 10.1 mg/dL (ref 8.9–10.3)
Chloride: 89 mmol/L — ABNORMAL LOW (ref 98–111)
Creatinine, Ser: 1.46 mg/dL — ABNORMAL HIGH (ref 0.44–1.00)
GFR, Estimated: 42 mL/min — ABNORMAL LOW (ref >60)
Glucose, Bld: 120 mg/dL — ABNORMAL HIGH (ref 70–99)
Potassium: 3.3 mmol/L — ABNORMAL LOW (ref 3.5–5.1)
Sodium: 135 mmol/L (ref 135–145)

## 2023-07-09 LAB — BRAIN NATRIURETIC PEPTIDE: B Natriuretic Peptide: 42.4 pg/mL (ref 0.0–100.0)

## 2023-07-09 LAB — IRON AND TIBC
Iron: 108 ug/dL (ref 28–170)
Saturation Ratios: 20 % (ref 10.4–31.8)
TIBC: 531 ug/dL — ABNORMAL HIGH (ref 250–450)
UIBC: 423 ug/dL

## 2023-07-09 LAB — TSH: TSH: 1.013 u[IU]/mL (ref 0.350–4.500)

## 2023-07-09 MED ORDER — FUROSEMIDE 20 MG PO TABS
80.0000 mg | ORAL_TABLET | Freq: Two times a day (BID) | ORAL | 3 refills | Status: DC
  Filled 2023-07-09 – 2023-07-10 (×2): qty 240, 30d supply, fill #0
  Filled 2023-07-31: qty 240, 30d supply, fill #1
  Filled 2023-08-12 – 2023-08-22 (×2): qty 240, 30d supply, fill #0

## 2023-07-09 NOTE — Patient Instructions (Signed)
 Medication Changes:  FUROSEMIDE 80MG  TWICE DAILY REFILLED   Lab Work:  Labs done today, your results will be available in MyChart, we will contact you for abnormal readings.  Follow-Up in: AS SCHEDULED IN MAY   At the [facility], you and your health needs are our priority. We have a designated team specialized in the treatment of Heart Failure. This Care Team includes your primary Heart Failure Specialized Cardiologist (physician), Advanced Practice Providers (APPs- Physician Assistants and Nurse Practitioners), and Pharmacist who all work together to provide you with the care you need, when you need it.   You may see any of the following providers on your designated Care Team at your next follow up:  Dr. Arvilla Meres Dr. Marca Ancona Dr. Dorthula Nettles Dr. Theresia Bough Tonye Becket, NP Robbie Lis, [facility] [place], [place] Brynda Peon, NP [place] Lee, NP Karle Plumber, PharmD   Please be sure to bring in all your medications bottles to every appointment.   Need to Contact us:  If you have any questions or concerns before your next appointment please send [place] a message through [place] or call our office at [phone].    TO LEAVE A MESSAGE FOR THE NURSE SELECT OPTION 2, PLEASE LEAVE A MESSAGE INCLUDING: YOUR NAME DATE OF BIRTH CALL BACK NUMBER REASON FOR CALL**this is important as we prioritize the call backs  YOU WILL RECEIVE A CALL BACK THE SAME DAY AS LONG AS YOU CALL BEFORE 4:00 PM

## 2023-07-10 ENCOUNTER — Other Ambulatory Visit (HOSPITAL_COMMUNITY): Payer: Self-pay

## 2023-07-11 ENCOUNTER — Other Ambulatory Visit (HOSPITAL_COMMUNITY): Payer: Self-pay

## 2023-07-12 ENCOUNTER — Ambulatory Visit (HOSPITAL_COMMUNITY)
Admission: RE | Admit: 2023-07-12 | Discharge: 2023-07-12 | Disposition: A | Discharge status: Home / Self Care | Attending: Internal Medicine | Admitting: Internal Medicine

## 2023-07-12 ENCOUNTER — Encounter (HOSPITAL_COMMUNITY)
Admission: RE | Disposition: A | Discharge status: Home / Self Care | Payer: Self-pay | Source: Home / Self Care | Attending: Internal Medicine

## 2023-07-12 ENCOUNTER — Other Ambulatory Visit: Payer: Self-pay

## 2023-07-12 DIAGNOSIS — E869 Volume depletion, unspecified: Secondary | ICD-10-CM | POA: Insufficient documentation

## 2023-07-12 DIAGNOSIS — I5022 Chronic systolic (congestive) heart failure: Secondary | ICD-10-CM

## 2023-07-12 DIAGNOSIS — D751 Secondary polycythemia: Secondary | ICD-10-CM | POA: Diagnosis not present

## 2023-07-12 DIAGNOSIS — G4733 Obstructive sleep apnea (adult) (pediatric): Secondary | ICD-10-CM | POA: Insufficient documentation

## 2023-07-12 DIAGNOSIS — Z8249 Family history of ischemic heart disease and other diseases of the circulatory system: Secondary | ICD-10-CM | POA: Diagnosis not present

## 2023-07-12 DIAGNOSIS — Z8616 Personal history of COVID-19: Secondary | ICD-10-CM | POA: Insufficient documentation

## 2023-07-12 DIAGNOSIS — I5042 Chronic combined systolic (congestive) and diastolic (congestive) heart failure: Secondary | ICD-10-CM | POA: Diagnosis not present

## 2023-07-12 DIAGNOSIS — Z87891 Personal history of nicotine dependence: Secondary | ICD-10-CM | POA: Diagnosis not present

## 2023-07-12 DIAGNOSIS — I714 Abdominal aortic aneurysm, without rupture, unspecified: Secondary | ICD-10-CM | POA: Insufficient documentation

## 2023-07-12 DIAGNOSIS — I2510 Atherosclerotic heart disease of native coronary artery without angina pectoris: Secondary | ICD-10-CM | POA: Diagnosis not present

## 2023-07-12 DIAGNOSIS — Z7901 Long term (current) use of anticoagulants: Secondary | ICD-10-CM | POA: Diagnosis not present

## 2023-07-12 DIAGNOSIS — R0609 Other forms of dyspnea: Secondary | ICD-10-CM | POA: Diagnosis not present

## 2023-07-12 DIAGNOSIS — Z79899 Other long term (current) drug therapy: Secondary | ICD-10-CM | POA: Diagnosis not present

## 2023-07-12 DIAGNOSIS — Z5986 Financial insecurity: Secondary | ICD-10-CM | POA: Insufficient documentation

## 2023-07-12 HISTORY — PX: RIGHT HEART CATH: CATH118263

## 2023-07-12 LAB — GLUCOSE, CAPILLARY
Glucose-Capillary: 107 mg/dL — ABNORMAL HIGH (ref 70–99)
Glucose-Capillary: 104 mg/dL — ABNORMAL HIGH (ref 70–99)

## 2023-07-12 LAB — BASIC METABOLIC PANEL WITH GFR
Anion gap: 11 (ref 5–15)
BUN: 33 mg/dL — ABNORMAL HIGH (ref 6–20)
CO2: 25 mmol/L (ref 22–32)
Calcium: 9.5 mg/dL (ref 8.9–10.3)
Chloride: 95 mmol/L — ABNORMAL LOW (ref 98–111)
Creatinine, Ser: 1.49 mg/dL — ABNORMAL HIGH (ref 0.44–1.00)
GFR, Estimated: 41 mL/min — ABNORMAL LOW (ref >60)
Glucose, Bld: 103 mg/dL — ABNORMAL HIGH (ref 70–99)
Potassium: 4 mmol/L (ref 3.5–5.1)
Sodium: 131 mmol/L — ABNORMAL LOW (ref 135–145)

## 2023-07-12 SURGERY — RIGHT HEART CATH
Anesthesia: LOCAL

## 2023-07-12 MED ORDER — SODIUM CHLORIDE 0.9 % IV SOLN: 250.0000 mL | INTRAVENOUS | Status: DC | PRN

## 2023-07-12 MED ORDER — SODIUM CHLORIDE 0.9% FLUSH: 3.0000 mL | INTRAVENOUS | Status: DC | PRN

## 2023-07-12 MED ORDER — HEPARIN (PORCINE) IN NACL 1000-0.9 UT/500ML-% IV SOLN
INTRAVENOUS | Status: DC | PRN
  Administered 2023-07-12 (×2): 500 mL

## 2023-07-12 MED ORDER — SODIUM CHLORIDE 0.9 % IV BOLUS
INTRAVENOUS | Status: DC | PRN
  Administered 2023-07-12: 200 mL via INTRAVENOUS

## 2023-07-12 MED ORDER — ONDANSETRON HCL 4 MG/2ML IJ SOLN: 4.0000 mg | Freq: Four times a day (QID) | INTRAMUSCULAR | Status: DC | PRN

## 2023-07-12 MED ORDER — SODIUM CHLORIDE 0.9 % IV SOLN: INTRAVENOUS | Status: DC

## 2023-07-12 MED ORDER — LIDOCAINE HCL (PF) 1 % IJ SOLN
INTRAMUSCULAR | Status: AC
  Filled 2023-07-12: qty 30

## 2023-07-12 MED ORDER — LIDOCAINE HCL (PF) 1 % IJ SOLN
INTRAMUSCULAR | Status: DC | PRN
  Administered 2023-07-12: 2 mL

## 2023-07-12 MED ORDER — LABETALOL HCL 5 MG/ML IV SOLN: 10.0000 mg | INTRAVENOUS | Status: DC | PRN

## 2023-07-12 MED ORDER — ACETAMINOPHEN 325 MG PO TABS: 650.0000 mg | ORAL_TABLET | ORAL | Status: DC | PRN

## 2023-07-12 MED ORDER — SODIUM CHLORIDE 0.9% FLUSH: 3.0000 mL | Freq: Two times a day (BID) | INTRAVENOUS | Status: DC

## 2023-07-12 MED ORDER — HYDRALAZINE HCL 20 MG/ML IJ SOLN: 10.0000 mg | INTRAMUSCULAR | Status: DC | PRN

## 2023-07-12 SURGICAL SUPPLY — 7 items
CATH SWAN GANZ 7F STRAIGHT (CATHETERS) IMPLANT
GLIDESHEATH SLENDER 7FR .021G (SHEATH) IMPLANT
PACK CARDIAC CATHETERIZATION (CUSTOM PROCEDURE TRAY) ×2 IMPLANT
SHEATH PROBE COVER 6X72 (BAG) IMPLANT
TRANSDUCER W/STOPCOCK (MISCELLANEOUS) IMPLANT
TUBING ART PRESS 72 MALE/FEM (TUBING) IMPLANT
WIRE MICRO SET SILHO 5FR 7 (SHEATH) IMPLANT

## 2023-07-12 NOTE — Discharge Instructions (Signed)

## 2023-07-12 NOTE — Interval H&P Note (Signed)
 History and Physical Interval Note:  07/12/2023 7:42 AM  Christine [name]  has presented today for surgery, with the diagnosis of heart failure.  The various methods of treatment have been discussed with the patient and family. After consideration of risks, benefits and other options for treatment, the patient has consented to  Procedure(s): RIGHT HEART CATH (N/A) as a surgical intervention.  The patient's history has been reviewed, patient examined, no change in status, stable for surgery.  I have reviewed the patient's chart and labs.  Questions were answered to the patient's satisfaction.     Jacarri Gesner

## 2023-07-15 ENCOUNTER — Encounter (HOSPITAL_COMMUNITY): Payer: Self-pay | Admitting: Internal Medicine

## 2023-07-15 LAB — POCT I-STAT EG7
Acid-Base Excess: 4 mmol/L — ABNORMAL HIGH (ref 0.0–2.0)
Acid-Base Excess: 4 mmol/L — ABNORMAL HIGH (ref 0.0–2.0)
Acid-Base Excess: 4 mmol/L — ABNORMAL HIGH (ref 0.0–2.0)
Bicarbonate: 29.3 mmol/L — ABNORMAL HIGH (ref 20.0–28.0)
Bicarbonate: 29.6 mmol/L — ABNORMAL HIGH (ref 20.0–28.0)
Bicarbonate: 29.6 mmol/L — ABNORMAL HIGH (ref 20.0–28.0)
Calcium, Ion: 1.13 mmol/L — ABNORMAL LOW (ref 1.15–1.40)
Calcium, Ion: 1.18 mmol/L (ref 1.15–1.40)
Calcium, Ion: 1.22 mmol/L (ref 1.15–1.40)
HCT: 39 % (ref 36.0–46.0)
HCT: 41 % (ref 36.0–46.0)
HCT: 42 % (ref 36.0–46.0)
Hemoglobin: 13.3 g/dL (ref 12.0–15.0)
Hemoglobin: 13.9 g/dL (ref 12.0–15.0)
Hemoglobin: 14.3 g/dL (ref 12.0–15.0)
O2 Saturation: 64 %
O2 Saturation: 65 %
O2 Saturation: 67 %
Potassium: 3.1 mmol/L — ABNORMAL LOW (ref 3.5–5.1)
Potassium: 3.6 mmol/L (ref 3.5–5.1)
Potassium: 3.7 mmol/L (ref 3.5–5.1)
Sodium: 134 mmol/L — ABNORMAL LOW (ref 135–145)
Sodium: 135 mmol/L (ref 135–145)
Sodium: 136 mmol/L (ref 135–145)
TCO2: 31 mmol/L (ref 22–32)
TCO2: 31 mmol/L (ref 22–32)
TCO2: 31 mmol/L (ref 22–32)
pCO2, Ven: 45.2 mmHg (ref 44–60)
pCO2, Ven: 46.2 mmHg (ref 44–60)
pCO2, Ven: 47.1 mmHg (ref 44–60)
pH, Ven: 7.405 (ref 7.25–7.43)
pH, Ven: 7.414 (ref 7.25–7.43)
pH, Ven: 7.419 (ref 7.25–7.43)
pO2, Ven: 33 mmHg (ref 32–45)
pO2, Ven: 33 mmHg (ref 32–45)
pO2, Ven: 35 mmHg (ref 32–45)

## 2023-07-16 ENCOUNTER — Other Ambulatory Visit: Payer: Self-pay

## 2023-07-16 ENCOUNTER — Telehealth: Payer: Self-pay

## 2023-07-16 DIAGNOSIS — K5000 Crohn's disease of small intestine without complications: Secondary | ICD-10-CM

## 2023-07-16 NOTE — Telephone Encounter (Signed)
-----   Message from [facility] M sent at 05/10/2023 11:11 AM EST ----- Patient of Dr. Barron Alvine need repeat lab/stool studies for Crohn's: ESR, CRP, Fecal cal, and Vedolizumab (Entyvio) [id number]. Make sure you get the last lab right before next Entyvio infusion.

## 2023-07-16 NOTE — Telephone Encounter (Signed)
 Called patient and informed her she needs repeat labs prior to to her Enytvio infusion. Informed patient to come to our lab in the basement on 08/14/23 to get labs done. Orders placed in Epic. Patient verbalized understanding. Patient also requested it be sent to her in Mychart.

## 2023-07-18 ENCOUNTER — Other Ambulatory Visit: Payer: Self-pay

## 2023-07-18 MED FILL — Ivabradine HCl Tab 5 MG (Base Equiv): ORAL | 30 days supply | Qty: 30 | Fill #1 | Status: CN

## 2023-07-18 MED FILL — Ivabradine HCl Tab 5 MG (Base Equiv): ORAL | 30 days supply | Qty: 30 | Fill #1 | Status: AC

## 2023-07-22 ENCOUNTER — Ambulatory Visit (INDEPENDENT_AMBULATORY_CARE_PROVIDER_SITE_OTHER): Payer: 59

## 2023-07-22 DIAGNOSIS — I5042 Chronic combined systolic (congestive) and diastolic (congestive) heart failure: Secondary | ICD-10-CM

## 2023-07-22 DIAGNOSIS — I255 Ischemic cardiomyopathy: Secondary | ICD-10-CM | POA: Diagnosis not present

## 2023-07-23 ENCOUNTER — Ambulatory Visit: Payer: Medicaid Other | Admitting: Primary Care

## 2023-07-23 ENCOUNTER — Other Ambulatory Visit (HOSPITAL_COMMUNITY): Payer: Self-pay

## 2023-07-23 ENCOUNTER — Encounter: Payer: Self-pay | Admitting: Primary Care

## 2023-07-23 VITALS — HR 101 | Temp 98.1°F | Ht 70.0 in | Wt 185.6 lb

## 2023-07-23 DIAGNOSIS — R0602 Shortness of breath: Secondary | ICD-10-CM | POA: Diagnosis not present

## 2023-07-23 DIAGNOSIS — G4733 Obstructive sleep apnea (adult) (pediatric): Secondary | ICD-10-CM

## 2023-07-23 LAB — CUP PACEART REMOTE DEVICE CHECK
Battery Remaining Longevity: 162 mo
Battery Remaining Percentage: 100 %
Brady Statistic RV Percent Paced: 0 %
Date Time Interrogation Session: 20250407030300
HighPow Impedance: 90 Ohm
Implantable Lead Connection Status: 753985
Implantable Lead Implant Date: 20230407
Implantable Lead Location: 753860
Implantable Lead Model: 138
Implantable Lead Serial Number: 303166
Implantable Pulse Generator Implant Date: 20230407
Lead Channel Impedance Value: 919 Ohm
Lead Channel Pacing Threshold Amplitude: 1.3 V
Lead Channel Pacing Threshold Pulse Width: 0.4 ms
Lead Channel Setting Pacing Amplitude: 2.5 V
Lead Channel Setting Pacing Pulse Width: 0.4 ms
Lead Channel Setting Sensing Sensitivity: 0.5 mV
Pulse Gen Serial Number: 216478
Zone Setting Status: 755011

## 2023-07-23 MED ORDER — ESZOPICLONE 1 MG PO TABS
1.0000 mg | ORAL_TABLET | Freq: Every day | ORAL | 0 refills | Status: DC
  Filled 2023-07-23: qty 15, 15d supply, fill #0

## 2023-07-23 NOTE — Progress Notes (Signed)
 @Patient  ID: Christine [name], female    DOB: 1965/12/22, 58 y.o.   MRN: [id number]  Chief Complaint  Patient presents with   Follow-up    S.O.B- Not getting enough air, gasping for air OSA Consult     Referring provider: Marcine Matar, MD  HPI: 58 year old female, former smoker quit in 2022.  Past medical history significant for abdominal aortic aneurysm, coronary artery disease, cardiomyopathy, combined systolic and diastolic heart failure, hypertension, paroxysmal tachycardia, obstructive sleep apnea, Crohn's disease, acute pancreatitis, polycythemia, status post ICD, obesity.  Home sleep study 07/05/2021 showed mild obstructive sleep apnea, AHI 11.8/h with SpO2 low 83%.  Patient spent 2.5 minutes with O2 saturation less than 88%.  Minimal snoring was present.  No central sleep apneas were noted.   07/23/2023 - Interim hx  Discussed the use of AI scribe software for clinical note transcription with the patient, who gave verbal consent to proceed.  History of Present Illness   Christine [name] "Christine [name]" is a 58 year old female with obstructive sleep apnea and cardiac issues who presents with shortness of breath. She was referred by her primary care physician for her history of sleep apnea and shortness of breath.  She has experienced increased shortness of breath over the past month. The shortness of breath occurs during the day and is not associated with physical activity. She describes the sensation as 'not getting enough air coming in'. No congestion or cough. She has a significant smoking history of approximately 40 years but has since quit.  She underwent a home sleep study in 2023, which showed mild obstructive sleep apnea with an average of 11.8 apneic events per hour and mild oxygen desaturations, with the lowest being 83%. She uses a CPAP machine with settings of 4 to 12 cm H2O but has had difficulty using it consistently over the past few weeks due to heart issues and medication  adjustments. She uses the CPAP machine 15 out of the last 30 nights, with an average apnea score of 3.78 events per hour. She has difficulty falling and staying asleep, despite using Ambien for 15-20 years, and has tried prazosin and melatonin without significant improvement.  She has a history of coronary artery disease and underwent a heart catheterization in March 2025, which indicated dehydration. Dr. Gala Romney suspected patient to be euvolemic and over diuresed, suggested weight goal 189-192lbs. Her current weight is 185 pounds. She has a history of low blood pressure, typically in the 90s/60s, and has never been on medication for hypertension. She takes Lasix 80 mg twice a day and metolazone twice a week to manage fluid retention, which affects her lab values. She has an ICD in place and follows up with cardiology regularly.  She has Crohn's disease, which has led to malabsorption issues, particularly with potassium, resulting in dangerously low levels. She was hospitalized in August 2024 due to this issue. Her potassium level was 5.2 in March 2025.   Typical bedtime is between 1130pm and 12:30 AM.  Patient reports takes hours to fall asleep.  She wakes up 3-4 times a night.  She starts her day between 730 and 8 AM.  She reports 70 pound weight loss over the last 2 years.  She had a sleep study done in 2023 with Dr. Carolanne Grumbling.    Airview download 04/24/2023 - 07/22/2023 Usage days 70/90 days (78%); 42 days (47%) greater than 4 hours Average usage 3 hours 38 minutes Pressure 4 to 15 cm H2O Air leaks 10.9 L/min (  95%) AHI 2.7   Allergies  Allergen Reactions   Gabapentin Diarrhea   Sulfa Antibiotics Hives, Itching and Rash   Erythromycin Hives   Tramadol Rash and Other (See Comments)    Urinary retention     Ibuprofen Itching   Tape Rash    Immunization History  Administered Date(s) Administered   Hepb-cpg 10/04/2022, 11/06/2022   Influenza, Seasonal, Injecte, Preservative Fre  01/15/2023   Influenza,inj,Quad PF,6+ Mos 02/06/2021, 01/09/2022   PFIZER(Purple Top)SARS-COV-2 Vaccination 08/29/2019, 09/23/2019, 04/02/2020   PNEUMOCOCCAL CONJUGATE-20 02/06/2021   Tdap 09/05/2021   Zoster Recombinant(Shingrix) 05/11/2022, 01/29/2023    Past Medical History:  Diagnosis Date   Allergy    Anxiety 01/20/2021   CAD in native artery 08/24/2021   CHF (congestive heart failure) (HCC)    Chronic combined systolic and diastolic heart failure (HCC) 08/24/2021   Complication of anesthesia    Crohn's colitis (HCC)    Depression    Hyperlipidemia    Myocardial infarction (HCC)    Presence of cardiac defibrillator 07/2021   Shortness of breath 01/20/2021   Sleep apnea 07/2021   Tobacco abuse 01/20/2021   Urticaria     Tobacco History: Social History   Tobacco Use  Smoking Status Former   Current packs/day: 0.00   Types: Cigarettes   Quit date: 01/15/2021   Years since quitting: 2.5   Passive exposure: Past  Smokeless Tobacco Never   Counseling given: Not Answered   Outpatient Medications Prior to Visit  Medication Sig Dispense Refill   Accu-Chek Softclix Lancets lancets Use as instructed.  Check blood sugar daily before breakfast. 100 each 12   acetaminophen (TYLENOL) 500 MG tablet Take 1,000 mg by mouth 3 (three) times daily as needed for moderate pain or headache.     ALPRAZolam (XANAX) 1 MG tablet Take 2 mg by mouth 3 (three) times daily.     alum & mag hydroxide-simeth (MAALOX/MYLANTA) 200-200-20 MG/5ML suspension Take 30 mLs by mouth every 4 (four) hours as needed for indigestion or heartburn. 355 mL 0   apixaban (ELIQUIS) 5 MG TABS tablet Take 1 tablet (5 mg total) by mouth 2 (two) times daily. 60 tablet 11   atorvastatin (LIPITOR) 80 MG tablet Take 1 tablet (80 mg total) by mouth daily. 90 tablet 3   Blood Glucose Monitoring Suppl (ACCU-CHEK GUIDE) w/Device KIT Check blood sugar once daily before breakfast 1 kit 0   buPROPion (WELLBUTRIN XL) 150 MG 24 hr  tablet Take 150 mg by mouth daily.     cetirizine (ZYRTEC ALLERGY) 10 MG tablet Take 1 tablet (10 mg total) by mouth 2 (two) times daily. 60 tablet 5   cyclobenzaprine (FLEXERIL) 10 MG tablet Take 10 mg by mouth 3 (three) times daily as needed for muscle spasms.     dapagliflozin propanediol (FARXIGA) 10 MG TABS tablet Take 1 tablet (10 mg total) by mouth daily. 30 tablet 9   diphenoxylate-atropine (LOMOTIL) 2.5-0.025 MG tablet Take 2 tablets by mouth 4 (four) times daily as needed for diarrhea/loose stools. 60 tablet 3   famotidine (PEPCID) 20 MG tablet Take 1 tablet (20 mg total) by mouth 2 (two) times daily. 60 tablet 5   feeding supplement (ENSURE ENLIVE / ENSURE PLUS) LIQD Take 237 mLs by mouth 2 (two) times daily between meals. 237 mL 12   Fezolinetant (VEOZAH) 45 MG TABS Take 1 tablet (45 mg total) by mouth daily. 30 tablet 2   fluticasone (FLONASE) 50 MCG/ACT nasal spray Place 1-2 sprays into both nostrils  daily as needed (nasal congestion). 16 g 5   furosemide (LASIX) 20 MG tablet Take 4 tablets (80 mg total) by mouth 2 (two) times daily. 240 tablet 3   glucose blood test strip Use as instructed to check blood sugar daily before breakfast 100 each 6   ivabradine (CORLANOR) 5 MG TABS tablet Take 1/2 tablet (2.5 mg total) by mouth 2 (two) times daily. 30 tablet 5   lipase/protease/amylase (CREON) 36000 UNITS CPEP capsule Take 36,000 Units by mouth in the morning, at noon, and at bedtime.     metolazone (ZAROXOLYN) 2.5 MG tablet Take 1 tablet (2.5 mg total) by mouth 2 (two) times a week. 8 tablet 1   metoprolol succinate (TOPROL-XL) 25 MG 24 hr tablet Take 1/2 tablet (12.5 mg total) by mouth daily. 45 tablet 1   metroNIDAZOLE (METROGEL) 1 % gel Apply 1 Application topically daily as needed (rash).     Multiple Vitamins-Minerals (MULTIVITAMIN WITH MINERALS) tablet Take 1 tablet by mouth daily.     oxymetazoline (AFRIN) 0.05 % nasal spray Place 1-2 sprays into both nostrils 2 (two) times daily  as needed for congestion.     potassium chloride SA (KLOR-CON M) 20 MEQ tablet Take 6 tablets (120 mEq total) by mouth 3 (three) times daily. (Patient taking differently: Take 100 mEq by mouth 3 (three) times daily.) 540 tablet 5   prazosin (MINIPRESS) 2 MG capsule Take 2-6 mg by mouth daily as needed (for sleep).     promethazine (PHENERGAN) 25 MG tablet Take 1 tablet (25 mg total) by mouth every 6 (six) to 8 (eight) hours for nausea or vomiting. 90 tablet 3   sitaGLIPtin (JANUVIA) 50 MG tablet Take 1 tablet (50 mg total) by mouth daily. 90 tablet 1   spironolactone (ALDACTONE) 25 MG tablet Take 1 tablet (25 mg total) by mouth daily. 90 tablet 3   triamcinolone cream (KENALOG) 0.1 % Apply 1 Application topically daily as needed (irritation).     valACYclovir (VALTREX) 500 MG tablet Take 1 tablet (500 mg total) by mouth 2 (two) times daily. (Patient taking differently: Take 500 mg by mouth 2 (two) times daily as needed (outbreaks).) 6 tablet 2   vedolizumab (ENTYVIO) 300 MG injection Inject 300 mg into the vein every 2 (two) months.     zolpidem (AMBIEN) 10 MG tablet Take 5-10 mg by mouth at bedtime.     No facility-administered medications prior to visit.   Review of Systems  Review of Systems  Constitutional: Negative.   HENT: Negative.    Respiratory:  Positive for shortness of breath. Negative for cough and wheezing.   Cardiovascular: Negative.   Psychiatric/Behavioral:  Positive for sleep disturbance.    Physical Exam  Pulse (!) 101   Temp 98.1 F (36.7 C) (Oral)   Ht 5\' 10"  (1.778 m)   Wt 185 lb 9.6 oz (84.2 kg)   SpO2 96%   BMI 26.63 kg/m  Physical Exam Constitutional:      Appearance: Normal appearance. She is not ill-appearing.  HENT:     Head: Normocephalic and atraumatic.     Mouth/Throat:     Mouth: Mucous membranes are moist.     Pharynx: Oropharynx is clear.  Cardiovascular:     Rate and Rhythm: Normal rate and regular rhythm.  Pulmonary:     Effort: Pulmonary  effort is normal.     Breath sounds: Normal breath sounds. No wheezing, rhonchi or rales.  Musculoskeletal:  General: Normal range of motion.     Right lower leg: No edema.     Left lower leg: No edema.  Skin:    General: Skin is warm and dry.  Neurological:     General: No focal deficit present.     Mental Status: She is alert and oriented to person, place, and time. Mental status is at baseline.  Psychiatric:        Mood and Affect: Mood normal.        Behavior: Behavior normal.        Thought Content: Thought content normal.        Judgment: Judgment normal.      Lab Results:  CBC    Component Value Date/Time   WBC 9.7 07/09/2023 1506   RBC 5.45 (H) 07/09/2023 1506   HGB 13.3 07/12/2023 0816   HGB 16.0 (H) 04/22/2023 1356   HCT 39.0 07/12/2023 0816   HCT 47.9 (H) 04/22/2023 1356   PLT 422 (H) 07/09/2023 1506   PLT 361 04/22/2023 1356   MCV 83.3 07/09/2023 1506   MCV 84 04/22/2023 1356   MCH 28.6 07/09/2023 1506   MCHC 34.4 07/09/2023 1506   RDW 13.3 07/09/2023 1506   RDW 12.5 04/22/2023 1356   LYMPHSABS 1.6 04/22/2023 1356   MONOABS 1.1 (H) 12/15/2022 1402   EOSABS 0.5 (H) 04/22/2023 1356   BASOSABS 0.1 04/22/2023 1356    BMET    Component Value Date/Time   NA 136 07/12/2023 0816   NA 139 03/25/2023 1412   K 3.1 (L) 07/12/2023 0816   CL 95 (L) 07/12/2023 0638   CO2 25 07/12/2023 0638   GLUCOSE 103 (H) 07/12/2023 0638   BUN 33 (H) 07/12/2023 0638   BUN 24 03/25/2023 1412   CREATININE 1.49 (H) 07/12/2023 0638   CREATININE 0.90 12/25/2021 1119   CALCIUM 9.5 07/12/2023 0638   GFRNONAA 41 (L) 07/12/2023 0638   GFRNONAA >60 12/25/2021 1119   GFRAA  03/31/2008 1140    >60        The eGFR has been calculated using the MDRD equation. This calculation has not been validated in all clinical    BNP    Component Value Date/Time   BNP 42.4 07/09/2023 1506    ProBNP    Component Value Date/Time   PROBNP 328 (H) 01/17/2022 1512     Imaging: CARDIAC CATHETERIZATION Result Date: 07/12/2023 Findings: RA = 5 RV = 19/8 PA = 20/11 (16) PCW = 8 Fick cardiac output/index = 3.96/1.96 Thermo CO/CI = 4.00/1.98 PVR = 2.0 WU Ao sat = 97% PA sat = 65% PAPi = 1.8 After 300cc fluid bolus PA = 28/11 (20) PW = 10 Fick CO/CI = 4.2/2.1 Thermo CO/CI = 5.1/2.5 PA sat = 67% Assessment: 1. Volume depletion with low cardiac output. Improved with volume resuscitation Plan/Discussion: Suspect she is keeping herself too dry with diuretics. Weight today was 186. Suggested gaol weight 189-192. Arvilla Meres, MD 8:39 AM    Assessment & Plan:   No problem-specific Assessment & Plan notes found for this encounter.  Assessment and Plan    Obstructive Sleep Apnea Mild obstructive sleep apnea with 11.8 apneic events per hour. CPAP usage inconsistent. Current apnea score 3.78 per hour, indicating control but with room for improvement. - Increase CPAP min pressure 6cm h20 and max pressure to 16 cm H2O. - Encourage consistent nightly use of CPAP. - Obtain download from CPAP machine to monitor usage and efficacy.  Shortness of Breath Increased  shortness of breath likely related to cardiac history and low ejection fraction. No obstructive or restrictive lung disease on spirometry during exercise stress test in 2023. Significant smoking history.  - Order pulmonary function test to assess for obstructive lung disease. - Consider CT scan if pulmonary function test shows abnormalities. - Evaluate for maintenance inhaler if evidence of obstructive lung disease or asthma.  Cardiac History Coronary artery disease, cardiomyopathy, systolic heart failure, status post ICD placement. Recent catheterization showed dehydration. Decreased ejection fraction likely contributing to symptoms. - Continue monitoring cardiac status with cardiologist. - Address low ejection fraction and related symptoms with cardiology team.  Insomnia Chronic insomnia with difficulty  falling and staying asleep. Developed tolerance to Ambien. Prazosin affected blood pressure. Melatonin ineffective. Considering Lunesta as alternative. - Discontinue Ambien and Prazosin. - Initiate Lunesta 1mg  at bedtime, with potential to increase dose if ineffective. - Advise taking Lunesta 30-60 minutes before bedtime.       Glenford Bayley, NP 07/23/2023

## 2023-07-23 NOTE — Patient Instructions (Addendum)
 Download from your CPAP device from [facility] shows your current pressure is 4-15cm h20; adjusting min pressure to 6cm h20 and max pressure 16cm h20   -OBSTRUCTIVE SLEEP APNEA: Obstructive sleep apnea is a condition where your breathing stops and starts repeatedly during sleep due to blocked airways. We have increased your CPAP machine's maximum pressure to 16 cm H2O and encourage you to use it consistently every night. We will also download data from your CPAP machine to monitor its usage and effectiveness.  -SHORTNESS OF BREATH: Your shortness of breath may be related to your heart condition and low ejection fraction, which means your heart is not pumping blood as well as it should. We will order a pulmonary function test to check for any lung issues and consider a CT scan if needed. If we find any lung problems, we may start you on a maintenance inhaler.  -CARDIAC HISTORY: You have a history of coronary artery disease and heart failure, which affects how well your heart pumps blood. We will continue to monitor your heart condition with your cardiologist and address any symptoms related to your low ejection fraction.  -INSOMNIA: Insomnia is difficulty falling or staying asleep. Since you have developed a tolerance to Ambien, we will discontinue it along with Prazosin. We will start you on Lunesta at a low dose, which you should take 30-60 minutes before bedtime. We may adjust the dose if needed.  INSTRUCTIONS: Please follow up with your cardiologist to continue monitoring your heart condition. Use your CPAP machine every night and take Lunesta as prescribed. We will contact you with the results of your pulmonary function test and any further steps needed.  Orders: Adjust CPAP pressure 6-16cm h20  Walk test on room air   Follow-up 6-8 weeks with [name] / 1 hour PFT prior

## 2023-07-25 ENCOUNTER — Other Ambulatory Visit: Payer: Self-pay | Admitting: Internal Medicine

## 2023-07-25 ENCOUNTER — Other Ambulatory Visit (HOSPITAL_COMMUNITY): Payer: Self-pay

## 2023-07-25 DIAGNOSIS — G4733 Obstructive sleep apnea (adult) (pediatric): Secondary | ICD-10-CM | POA: Diagnosis not present

## 2023-07-25 MED ORDER — PANCRELIPASE (LIP-PROT-AMYL) 36000-114000 UNITS PO CPEP
36000.0000 [IU] | ORAL_CAPSULE | Freq: Three times a day (TID) | ORAL | 1 refills | Status: DC
  Filled 2023-07-25 – 2023-10-14 (×3): qty 200, 67d supply, fill #0
  Filled 2024-01-15: qty 100, 34d supply, fill #1
  Filled 2024-02-13: qty 100, 34d supply, fill #2

## 2023-07-26 ENCOUNTER — Ambulatory Visit: Payer: Self-pay

## 2023-07-26 DIAGNOSIS — G4733 Obstructive sleep apnea (adult) (pediatric): Secondary | ICD-10-CM | POA: Diagnosis not present

## 2023-07-26 NOTE — Telephone Encounter (Signed)
 Copied from CRM [id number]. Topic: Clinical - Medical Advice >> Jul 26, 2023 10:21 AM Patsy Lager T wrote: Reason for CRM: patient called stated her hands and feet are freezing cold like ice first thing in the morning. She is shivering and has to drink something warm to warm up. This has happened now for 2 weeks.  Chief Complaint: Chills Symptoms: Cold hands and feet Frequency: 2 weeks Pertinent Negatives: Patient denies ever Disposition: [] ED /[] Urgent Care (no appt availability in office) / [x] Appointment(In office/virtual)/ []  [facility]/ [] Home Care/ [] Refused Recommended Disposition /[] [facility]/ []  Follow-up with PCP Additional Notes: Patient called in to report moderate chills and cold hands and feet that have been occurring for 2 weeks. Patient stated symptoms only persist for 1-2 hours in the morning. Patient stated she stays under a thick blanket until she warms up. Patient has not been evaluated by a provider for this. Patient denied fever. Patient also denied the following: runny nose, cough, sore throat, headache, dizziness, increased HR and numbness/tingling in hands and feet. Advised patient to see provider within 3 days. No availability with PCP/PCP office. Scheduled appointment for Monday morning with provider at alternate office. Provided care advice and instructed patient to call back if symptoms worsen. Patient complied.   Reason for Disposition  Nursing judgment or information in reference  Answer Assessment - Initial Assessment Questions 1. TEMPERATURE: "What is the most recent temperature?"  "How was it measured?"      97.8-98.6, denies fever but states she has been having moderate chills in the morning 2. ONSET: "When did the fever start?"      2 weeks 3. CHILLS: "Do you have chills?" If yes: "How bad are they?"  (e.g., none, mild, moderate, severe)   - NONE: no chills   - MILD: feeling cold   - MODERATE: feeling very cold, some shivering (feels  better under a thick blanket)   - SEVERE: feeling extremely cold with shaking chills (general body shaking, rigors; even under a thick blanket)      Moderate, states she has to stay under a thick blanket for 2 hours in the morning before she starts to warm-up, states chills get so bad that her teeth chatters at times  4. OTHER SYMPTOMS: "Do you have any other symptoms besides the fever?"  (e.g., abdomen pain, cough, diarrhea, earache, headache, sore throat, urination pain)     Denies runny nose, denies cough, denies headache, denies dizziness, denies numbness/tingling in extremities 5. CAUSE: If there are no symptoms, ask: "What do you think is causing the fever?"      Unknown, maybe vitamin or mineral deficiency  6. CONTACTS: "Does anyone else in the family have an infection?"     N/A 7. TREATMENT: "What have you done so far to treat this fever?" (e.g., medications)     N/A 8. IMMUNOCOMPROMISE: "Do you have of the following: diabetes, HIV positive, splenectomy, cancer chemotherapy, chronic steroid treatment, transplant patient, etc."     Diabetes, CHF and Crohn's disease  Protocols used: Fever-A-AH, No Guideline Available-A-AH

## 2023-07-27 DIAGNOSIS — Z419 Encounter for procedure for purposes other than remedying health state, unspecified: Secondary | ICD-10-CM | POA: Diagnosis not present

## 2023-07-29 ENCOUNTER — Ambulatory Visit: Attending: Internal Medicine

## 2023-07-29 ENCOUNTER — Encounter: Payer: Self-pay | Admitting: Family Medicine

## 2023-07-29 ENCOUNTER — Other Ambulatory Visit (HOSPITAL_COMMUNITY): Payer: Self-pay

## 2023-07-29 ENCOUNTER — Ambulatory Visit (INDEPENDENT_AMBULATORY_CARE_PROVIDER_SITE_OTHER): Admitting: Family Medicine

## 2023-07-29 VITALS — BP 96/60 | HR 76 | Temp 98.3°F | Ht 70.0 in | Wt 184.4 lb

## 2023-07-29 DIAGNOSIS — E119 Type 2 diabetes mellitus without complications: Secondary | ICD-10-CM

## 2023-07-29 DIAGNOSIS — I5042 Chronic combined systolic (congestive) and diastolic (congestive) heart failure: Secondary | ICD-10-CM | POA: Diagnosis not present

## 2023-07-29 DIAGNOSIS — R6883 Chills (without fever): Secondary | ICD-10-CM | POA: Diagnosis not present

## 2023-07-29 DIAGNOSIS — Z7984 Long term (current) use of oral hypoglycemic drugs: Secondary | ICD-10-CM | POA: Diagnosis not present

## 2023-07-29 DIAGNOSIS — E559 Vitamin D deficiency, unspecified: Secondary | ICD-10-CM | POA: Diagnosis not present

## 2023-07-29 DIAGNOSIS — E876 Hypokalemia: Secondary | ICD-10-CM

## 2023-07-29 DIAGNOSIS — R6889 Other general symptoms and signs: Secondary | ICD-10-CM | POA: Diagnosis not present

## 2023-07-29 DIAGNOSIS — Z95810 Presence of automatic (implantable) cardiac defibrillator: Secondary | ICD-10-CM | POA: Diagnosis not present

## 2023-07-29 DIAGNOSIS — N1832 Chronic kidney disease, stage 3b: Secondary | ICD-10-CM

## 2023-07-29 DIAGNOSIS — D751 Secondary polycythemia: Secondary | ICD-10-CM

## 2023-07-29 LAB — COMPREHENSIVE METABOLIC PANEL WITH GFR
ALT: 51 U/L — ABNORMAL HIGH (ref 0–35)
AST: 36 U/L (ref 0–37)
Albumin: 4.8 g/dL (ref 3.5–5.2)
Alkaline Phosphatase: 113 U/L (ref 39–117)
BUN: 25 mg/dL — ABNORMAL HIGH (ref 6–23)
CO2: 37 meq/L — ABNORMAL HIGH (ref 19–32)
Calcium: 10 mg/dL (ref 8.4–10.5)
Chloride: 90 meq/L — ABNORMAL LOW (ref 96–112)
Creatinine, Ser: 1.47 mg/dL — ABNORMAL HIGH (ref 0.40–1.20)
GFR: 39.89 mL/min — ABNORMAL LOW (ref >60.00)
Glucose, Bld: 94 mg/dL (ref 70–99)
Potassium: 3.2 meq/L — ABNORMAL LOW (ref 3.5–5.1)
Sodium: 137 meq/L (ref 135–145)
Total Bilirubin: 0.5 mg/dL (ref 0.2–1.2)
Total Protein: 8.1 g/dL (ref 6.0–8.3)

## 2023-07-29 LAB — CBC WITH DIFFERENTIAL/PLATELET
Basophils Absolute: 0.1 10*3/uL (ref 0.0–0.1)
Basophils Relative: 0.7 % (ref 0.0–3.0)
Eosinophils Absolute: 0.2 10*3/uL (ref 0.0–0.7)
Eosinophils Relative: 2.6 % (ref 0.0–5.0)
HCT: 44.9 % (ref 36.0–46.0)
Hemoglobin: 15.4 g/dL — ABNORMAL HIGH (ref 12.0–15.0)
Lymphocytes Relative: 17.1 % (ref 12.0–46.0)
Lymphs Abs: 1.5 10*3/uL (ref 0.7–4.0)
MCHC: 34.3 g/dL (ref 30.0–36.0)
MCV: 86.3 fl (ref 78.0–100.0)
Monocytes Absolute: 0.9 10*3/uL (ref 0.1–1.0)
Monocytes Relative: 10.2 % (ref 3.0–12.0)
Neutro Abs: 6.2 10*3/uL (ref 1.4–7.7)
Neutrophils Relative %: 69.4 % (ref 43.0–77.0)
Platelets: 374 10*3/uL (ref 150.0–400.0)
RBC: 5.2 Mil/uL — ABNORMAL HIGH (ref 3.87–5.11)
RDW: 14 % (ref 11.5–15.5)
WBC: 9 10*3/uL (ref 4.0–10.5)

## 2023-07-29 LAB — VITAMIN D 25 HYDROXY (VIT D DEFICIENCY, FRACTURES): VITD: 15.94 ng/mL — ABNORMAL LOW (ref 30.00–100.00)

## 2023-07-29 LAB — TSH: TSH: 0.56 u[IU]/mL (ref 0.35–5.50)

## 2023-07-29 LAB — VITAMIN B12: Vitamin B-12: 929 pg/mL — ABNORMAL HIGH (ref 211–911)

## 2023-07-29 MED ORDER — VITAMIN D (ERGOCALCIFEROL) 1.25 MG (50000 UNIT) PO CAPS
50000.0000 [IU] | ORAL_CAPSULE | ORAL | 0 refills | Status: DC
  Filled 2023-07-29: qty 8, 56d supply, fill #0

## 2023-07-29 NOTE — Patient Instructions (Signed)
 We are checking labs today, will be in contact with any results that require further attention  Follow-up with me for new or worsening symptoms.  Follow up with specialists as scheduled.

## 2023-07-29 NOTE — Progress Notes (Signed)
 Acute Office Visit  Subjective:     Patient ID: Christine [name], female    DOB: 11-19-1965, 58 y.o.   MRN: [id number]  No chief complaint on file.   HPI Patient is in today for evaluation of 3-week history of cold intolerance, states that when she wakes up in the morning she has the chills that are so severe that her teeth are chattering. States that it takes her an hour or 2 to get warmed up in the morning enough so that she can get up and move around. States that she did have to take a metolazone on Saturday, states this typically interferes with GFR, so that we will be aware will recheck labs today. Has history of hypokalemia, hyponatremia, asymptomatic hypotension, complex cardiac history, Crohn's disease. States upset stomach a day or 2 last week, but overall Crohn's appears to be controlled. Denies any other new medication changes, activity changes or environmental changes. Denies other concerns today. Medical history as outlined below.  ROS Per HPI      Objective:    BP 96/60 (BP Location: Left Arm, Patient Position: Sitting)   Pulse 76   Temp 98.3 F (36.8 C) (Temporal)   Ht 5\' 10"  (1.778 m)   Wt 184 lb 6.4 oz (83.6 kg)   SpO2 93%   BMI 26.46 kg/m    Physical Exam Vitals and nursing note reviewed.  Constitutional:      General: She is not in acute distress.    Appearance: Normal appearance. She is normal weight.  HENT:     Head: Normocephalic and atraumatic.     Right Ear: External ear normal.     Left Ear: External ear normal.     Mouth/Throat:     Pharynx: No oropharyngeal exudate.  Eyes:     Extraocular Movements: Extraocular movements intact.  Cardiovascular:     Rate and Rhythm: Normal rate and regular rhythm.     Pulses: Normal pulses.     Heart sounds: Normal heart sounds.  Pulmonary:     Effort: Pulmonary effort is normal. No respiratory distress.     Breath sounds: Normal breath sounds. No wheezing, rhonchi or rales.  Musculoskeletal:         General: Normal range of motion.     Cervical back: Normal range of motion.     Right lower leg: No edema.     Left lower leg: No edema.  Lymphadenopathy:     Cervical: No cervical adenopathy.  Neurological:     General: No focal deficit present.     Mental Status: She is alert and oriented to person, place, and time.  Psychiatric:        Mood and Affect: Mood normal.        Thought Content: Thought content normal.     No results found for any visits on 07/29/23.      Assessment & Plan:   Chills -     Comprehensive metabolic panel with GFR -     TSH  Stage 3b chronic kidney disease (HCC) -     Comprehensive metabolic panel with GFR  Hypokalemia -     Comprehensive metabolic panel with GFR  Diabetes mellitus treated with oral medication (HCC) -     CBC with Differential/Platelet -     Comprehensive metabolic panel with GFR  Polycythemia -     CBC with Differential/Platelet -     Comprehensive metabolic panel with GFR -     Vitamin B12  Vitamin D deficiency -     VITAMIN D 25 Hydroxy (Vit-D Deficiency, Fractures)  Cold intolerance -     TSH  Continue current medication regimen We will follow-up and adjust meds as needed, or decide next steps after labs are resulted Considering thyroid disorder given cold intolerance, following up with labs today  No orders of the defined types were placed in this encounter.   Return if symptoms worsen or fail to improve.  Wellington Half, FNP

## 2023-07-29 NOTE — Addendum Note (Signed)
 Addended by: Wellington Half on: 07/29/2023 01:22 PM   Modules accepted: Orders

## 2023-07-31 ENCOUNTER — Other Ambulatory Visit: Payer: Self-pay

## 2023-07-31 ENCOUNTER — Other Ambulatory Visit (HOSPITAL_COMMUNITY): Payer: Self-pay

## 2023-08-01 ENCOUNTER — Encounter: Payer: Self-pay | Admitting: Primary Care

## 2023-08-02 ENCOUNTER — Other Ambulatory Visit: Payer: Self-pay | Admitting: Gastroenterology

## 2023-08-02 ENCOUNTER — Other Ambulatory Visit: Payer: Self-pay

## 2023-08-02 ENCOUNTER — Other Ambulatory Visit: Payer: Self-pay | Admitting: Internal Medicine

## 2023-08-02 ENCOUNTER — Other Ambulatory Visit (HOSPITAL_COMMUNITY): Payer: Self-pay

## 2023-08-02 DIAGNOSIS — R232 Flushing: Secondary | ICD-10-CM

## 2023-08-02 MED ORDER — VEOZAH 45 MG PO TABS
45.0000 mg | ORAL_TABLET | Freq: Every day | ORAL | 2 refills | Status: DC
  Filled 2023-08-02 – 2023-09-02 (×4): qty 30, 30d supply, fill #0
  Filled 2023-09-30: qty 30, 30d supply, fill #1

## 2023-08-02 NOTE — Progress Notes (Signed)
 EPIC Encounter for ICM Monitoring  Patient Name: Christine [name] is a 58 y.o. female Date: 08/02/2023 Primary Care Physican: Christine Presume, MD Primary Cardiologist: Christine [name] Electrophysiologist: Christine [name] Last Weight: 185.5 lbs 12/19/2022 Weight: 179 lbs  02/06/2023 Weight: 182.5 lbs 02/13/2023 Weight: 181 lbs  03/12/2023 Weight: 183 lbs  04/16/2023 Weight: 183 lbs 05/27/2023 Weight: 182 - 183 lbs (baseline)          06/24/2023 Weight: 181 lbs 06/26/2023 Weight: 188 lbs          08/02/2023 Weight: 184-185 lbs                                    Spoke with patient and heart failure questions reviewed.  Transmission results reviewed.  Pt asymptomatic for fluid accumulation.  Reports feeling well at this time and voices no complaints.  She reports Dr Christine [name] after cardiac cath, said he would like her dry weight to be closer to 189 lbs.   HeartLogic Heart Failure Index remains 2 suggesting fluid levels are within normal threshold range.     Prescribed:  Furosemide  20 mg take 4 tablet(s) (80 mg total) by mouth twice a day Potassium 20 mEq take 6 tablet(s) (120 mEq total) by mouth three (3) times a day Metolazone  2.5 mg take 1 tablet by mouth two times weekly.  Take additional 40 mEq of Potassium with each dose Spironolactone  25 mg take 1 tablet by mouth daily.    Labs: 07/29/2023 Creatinine 1.47, BUN 25, Potassium 3.2, Sodium 137  07/12/2023 Creatinine NA, BUN NA, Potassium 3.1, Sodium 136 (8:16 AM)  07/12/2023 Creatinine 1.49, BUN 33, Potassium 4.0, Sodium 131, GFR 41 (6:38 AM)  07/09/2023 Creatinine 1.46, BUN 32, Potassium 3.3, Sodium 135, GFR 42 07/04/2023 Creatinine 1.40, BUN 32, Potassium 5.2, Sodium 133, GFR 44  06/27/2023 Creatinine 1.08, BUN 23, Potassium 4.1, Sodium 137, GFR >60  05/22/2023 Creatinine 1.31, BUN 17, Potassium 3.8, Sodium 137, GFR 48 05/10/2023 Creatinine 1.58, BUN 31, Potassium 3.3, Sodium 134, GFR 38 04/23/2023 Creatinine 1.29, BUN 19, Potassium 4.1,  Sodium 136, GFR 49 04/08/2023 Creatinine 1.00, BUN 16, Potassium 3.5, Sodium 135, GFR >60  A complete set of results can be found in Results Review.   Recommendations:  No changes and encouraged to call if experiencing any fluid symptoms.   Follow-up plan: ICM clinic phone appointment on 09/10/2023.   91 day device clinic remote transmission 10/22/2023.              EP/Cardiology next office visit:  08/27/2023 with HF clinic.  Recall 11/18/2023 with Dr Christine [name].  Recall 03/15/2024 with Dr  Christine [name].          Copy of ICM check sent to Dr. Rodolfo [name].    3 Month HeartLogicT Heart Failure Index:    8 Day Data Trend:          Christine Jain, RN 08/02/2023 2:14 PM

## 2023-08-02 NOTE — Telephone Encounter (Signed)
 Requested medication (s) are due for refill today - yes  Requested medication (s) are on the active medication list -yes  Future visit scheduled -yes  Last refill: 05/08/23 #30 2RF  Notes to clinic: off protocol- provider review   Requested Prescriptions  Pending Prescriptions Disp Refills   Fezolinetant  (VEOZAH ) 45 MG TABS 30 tablet 2    Sig: Take 1 tablet (45 mg total) by mouth daily.     Off-Protocol Failed - 08/02/2023 12:52 PM      Failed - Medication not assigned to a protocol, review manually.      Passed - Valid encounter within last 12 months    Recent Outpatient Visits           2 months ago Type 2 diabetes mellitus with other specified complication, without long-term current use of insulin  (HCC)   [facility] [facility] [place]. [facility] Concetta Dee B, MD   6 months ago Type 2 diabetes mellitus with other specified complication, without long-term current use of insulin  [facility])   [place] [facility] [place]. [facility] Lawrance Presume, MD   9 months ago Injury of great toenail   [place] [facility] [place] [place]. [facility] Concetta Dee B, MD   10 months ago Coronary artery disease involving native coronary artery of native heart without angina pectoris   [place] [facility] [place]. 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 [facility] Lawrance Presume, MD       Future Appointments             In 1 month Lincoln Renshaw Rexine Cater, MD [facility] [place] - A Dept Of [place] [facility]. [facility]   In 4 months Burdette Carolin Archibald Kobus, DO [facility] [place] [facility] [place]   In 5 months Sandridge, Brenda K, PA-C [facility]   In 7 months Rice, Haig Levan, MD [facility] - A Dept Of [place] [facility]. [facility]               Requested Prescriptions  Pending Prescriptions Disp Refills   Fezolinetant  (VEOZAH ) 45 MG TABS 30 tablet 2    Sig: Take 1 tablet (45 mg total) by mouth daily.     Off-Protocol Failed - 08/02/2023 12:52 PM      Failed - Medication not assigned to a protocol, review manually.      Passed - Valid encounter within last 12 months    Recent Outpatient Visits           2 months ago Type 2 diabetes mellitus with other specified complication, without long-term current use of insulin  (HCC)   [place] [facility] [place]. [facility] Concetta Dee B, MD   6 months ago Type 2 diabetes mellitus with other specified complication, without long-term current use of insulin  [facility])   [place] [facility] [place]. [facility] Lawrance Presume, MD   9 months ago Injury of great toenail   [place] [facility] [place] - [facility] Of . [facility] Concetta Dee B, MD   10 months ago Coronary artery disease involving native coronary artery of native heart without angina pectoris   [place] [facility] [place] [facility] Of [place]. 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 [facility] Lawrance Presume, MD       Future Appointments             In 1 month Lincoln Renshaw Rexine Cater, MD [facility] [place] - A Dept Of [place]. [facility]   In 4 months Burdette Carolin Archibald Kobus, DO [facility] [place] [facility] [place]   In 5 months Sandridge, Brenda K, PA-C [facility]   In 7 months Rice, Haig Levan, MD [facility] - A Dept Of [place]. [facility]

## 2023-08-03 ENCOUNTER — Encounter: Payer: Self-pay | Admitting: Internal Medicine

## 2023-08-04 ENCOUNTER — Other Ambulatory Visit (HOSPITAL_COMMUNITY): Payer: Self-pay | Admitting: Family Medicine

## 2023-08-04 DIAGNOSIS — I5022 Chronic systolic (congestive) heart failure: Secondary | ICD-10-CM

## 2023-08-05 ENCOUNTER — Telehealth: Payer: Self-pay | Admitting: Gastroenterology

## 2023-08-05 ENCOUNTER — Other Ambulatory Visit (HOSPITAL_COMMUNITY): Payer: Self-pay

## 2023-08-05 MED ORDER — DIPHENOXYLATE-ATROPINE 2.5-0.025 MG PO TABS
2.0000 | ORAL_TABLET | Freq: Four times a day (QID) | ORAL | 3 refills | Status: DC | PRN
  Filled 2023-08-05 – 2023-09-05 (×2): qty 60, 8d supply, fill #0
  Filled 2024-03-03 – 2024-03-06 (×2): qty 60, 8d supply, fill #1

## 2023-08-05 NOTE — Telephone Encounter (Signed)
Prescription printed and faxed to patient's pharmacy.

## 2023-08-05 NOTE — Telephone Encounter (Signed)
 Inbound call from patient, would refill of Lomotil  [facility] [address].

## 2023-08-06 ENCOUNTER — Encounter (HOSPITAL_COMMUNITY): Payer: Self-pay

## 2023-08-06 ENCOUNTER — Other Ambulatory Visit (HOSPITAL_COMMUNITY): Payer: Self-pay

## 2023-08-06 ENCOUNTER — Other Ambulatory Visit: Payer: Self-pay

## 2023-08-06 ENCOUNTER — Other Ambulatory Visit: Payer: Self-pay | Admitting: Primary Care

## 2023-08-06 MED ORDER — ESZOPICLONE 2 MG PO TABS
2.0000 mg | ORAL_TABLET | Freq: Every evening | ORAL | 0 refills | Status: DC | PRN
  Filled 2023-08-06: qty 15, 15d supply, fill #0

## 2023-08-06 NOTE — Telephone Encounter (Signed)
**Note De-identified  [place] Obfuscation** Please advise 

## 2023-08-06 NOTE — Telephone Encounter (Signed)
 Copied from CRM [id number]. Topic: Clinical - Prescription Issue >> Aug 05, 2023 12:00 PM Crist Dominion wrote: Reason for CRM: Patient is requesting Christine [name] to prescribe her a higher dosage of the eszopiclone  (LUNESTA ) 1 MG TABS tablet and please send this to: [facility] [address] [place] [address] Phone: [phone] Fax: [phone]  Christine [name] can you please advise.

## 2023-08-06 NOTE — Telephone Encounter (Signed)
 Sent RX for Lunesta  2mg 

## 2023-08-08 DIAGNOSIS — R000 Tachycardia, unspecified: Secondary | ICD-10-CM | POA: Diagnosis not present

## 2023-08-08 DIAGNOSIS — R002 Palpitations: Secondary | ICD-10-CM

## 2023-08-12 ENCOUNTER — Other Ambulatory Visit (HOSPITAL_COMMUNITY): Payer: Self-pay

## 2023-08-12 MED FILL — Ivabradine HCl Tab 5 MG (Base Equiv): ORAL | 30 days supply | Qty: 30 | Fill #0 | Status: AC

## 2023-08-13 ENCOUNTER — Other Ambulatory Visit (HOSPITAL_COMMUNITY): Payer: Self-pay

## 2023-08-14 ENCOUNTER — Other Ambulatory Visit (INDEPENDENT_AMBULATORY_CARE_PROVIDER_SITE_OTHER)

## 2023-08-14 DIAGNOSIS — K5000 Crohn's disease of small intestine without complications: Secondary | ICD-10-CM

## 2023-08-14 LAB — SEDIMENTATION RATE: Sed Rate: 41 mm/h — ABNORMAL HIGH (ref 0–30)

## 2023-08-14 LAB — HIGH SENSITIVITY CRP: CRP, High Sensitivity: 1.62 mg/L (ref 0.000–5.000)

## 2023-08-15 ENCOUNTER — Ambulatory Visit (HOSPITAL_COMMUNITY)
Admission: RE | Admit: 2023-08-15 | Discharge: 2023-08-15 | Disposition: A | Discharge status: Home / Self Care | Source: Ambulatory Visit | Attending: Gastroenterology | Admitting: Gastroenterology

## 2023-08-15 ENCOUNTER — Other Ambulatory Visit

## 2023-08-15 DIAGNOSIS — K5000 Crohn's disease of small intestine without complications: Secondary | ICD-10-CM | POA: Insufficient documentation

## 2023-08-15 MED ORDER — SODIUM CHLORIDE 0.9 % IV SOLN
300.0000 mg | INTRAVENOUS | Status: DC
  Administered 2023-08-15: 300 mg via INTRAVENOUS
  Filled 2023-08-15: qty 5

## 2023-08-16 ENCOUNTER — Other Ambulatory Visit (HOSPITAL_COMMUNITY): Payer: Self-pay

## 2023-08-16 ENCOUNTER — Encounter: Payer: Self-pay | Admitting: Gastroenterology

## 2023-08-17 LAB — QUANTIFERON-TB GOLD PLUS (RQFGPL)
QuantiFERON Mitogen Value: 6.36 [IU]/mL
QuantiFERON Nil Value: 0.03 [IU]/mL
QuantiFERON TB1 Ag Value: 0.04 [IU]/mL
QuantiFERON TB2 Ag Value: 0.04 [IU]/mL

## 2023-08-17 LAB — CALPROTECTIN, FECAL: Calprotectin, Fecal: 61 ug/g (ref 0–120)

## 2023-08-17 LAB — QUANTIFERON-TB GOLD PLUS: QuantiFERON-TB Gold Plus: NEGATIVE

## 2023-08-19 ENCOUNTER — Other Ambulatory Visit (HOSPITAL_COMMUNITY): Payer: Self-pay

## 2023-08-19 ENCOUNTER — Other Ambulatory Visit: Payer: Self-pay

## 2023-08-19 ENCOUNTER — Other Ambulatory Visit (HOSPITAL_COMMUNITY): Payer: Self-pay | Admitting: Internal Medicine

## 2023-08-19 ENCOUNTER — Telehealth: Payer: Self-pay

## 2023-08-19 MED ORDER — ESZOPICLONE 3 MG PO TABS
3.0000 mg | ORAL_TABLET | Freq: Every day | ORAL | 0 refills | Status: DC
  Filled 2023-08-19: qty 15, 15d supply, fill #0

## 2023-08-19 NOTE — Telephone Encounter (Signed)
 Yes we can increase to 3mg , RX sent

## 2023-08-19 NOTE — Telephone Encounter (Signed)
 Copied from CRM [id number]. Topic: Clinical - Medication Question >> Aug 19, 2023  8:39 AM Margarette Shawl wrote: Reason for CRM:   Pt's Lunesta  dosage was increased to 2 mg a couple of weeks ago; however, she is only able to get 4 hours of sleep with new dosage. Is requesting the dosage be increased to 3 mg if possible. She would also like to have a month's supply called in, instead of 2-week supply at a time because it causes her to pay 2 copays in 1 month. Requests script be sent to [facility] on file.   CB# [phone]  [phone]  [name] can you please advise

## 2023-08-20 ENCOUNTER — Other Ambulatory Visit (HOSPITAL_COMMUNITY): Payer: Self-pay

## 2023-08-20 NOTE — Telephone Encounter (Signed)
 Pt notified rx sent to pharmacy

## 2023-08-21 ENCOUNTER — Other Ambulatory Visit (HOSPITAL_COMMUNITY): Payer: Self-pay | Admitting: Internal Medicine

## 2023-08-21 ENCOUNTER — Other Ambulatory Visit (HOSPITAL_COMMUNITY): Payer: Self-pay

## 2023-08-22 ENCOUNTER — Other Ambulatory Visit (HOSPITAL_COMMUNITY): Payer: Self-pay

## 2023-08-23 ENCOUNTER — Other Ambulatory Visit (HOSPITAL_COMMUNITY): Payer: Self-pay

## 2023-08-23 ENCOUNTER — Encounter: Payer: Self-pay | Admitting: Gastroenterology

## 2023-08-23 ENCOUNTER — Other Ambulatory Visit: Payer: Self-pay | Admitting: Gastroenterology

## 2023-08-23 ENCOUNTER — Other Ambulatory Visit (HOSPITAL_COMMUNITY): Payer: Self-pay | Admitting: Internal Medicine

## 2023-08-23 MED ORDER — DAPAGLIFLOZIN PROPANEDIOL 10 MG PO TABS
10.0000 mg | ORAL_TABLET | Freq: Every day | ORAL | 11 refills | Status: DC
  Filled 2023-08-23: qty 30, 30d supply, fill #0

## 2023-08-23 MED ORDER — PROMETHAZINE HCL 25 MG PO TABS
25.0000 mg | ORAL_TABLET | Freq: Four times a day (QID) | ORAL | 3 refills | Status: DC
  Filled 2023-08-23: qty 90, 23d supply, fill #0
  Filled 2023-09-16: qty 90, 23d supply, fill #1
  Filled 2023-10-06: qty 90, 23d supply, fill #2
  Filled 2023-10-30: qty 90, 23d supply, fill #3

## 2023-08-24 ENCOUNTER — Encounter (HOSPITAL_BASED_OUTPATIENT_CLINIC_OR_DEPARTMENT_OTHER): Payer: Self-pay | Admitting: *Deleted

## 2023-08-24 ENCOUNTER — Emergency Department (HOSPITAL_BASED_OUTPATIENT_CLINIC_OR_DEPARTMENT_OTHER)

## 2023-08-24 ENCOUNTER — Telehealth: Payer: Self-pay | Admitting: Physician Assistant

## 2023-08-24 ENCOUNTER — Other Ambulatory Visit: Payer: Self-pay

## 2023-08-24 ENCOUNTER — Observation Stay (HOSPITAL_BASED_OUTPATIENT_CLINIC_OR_DEPARTMENT_OTHER)
Admission: EM | Admit: 2023-08-24 | Discharge: 2023-08-26 | Disposition: A | Discharge status: Home / Self Care | Attending: Cardiology | Admitting: Cardiology

## 2023-08-24 DIAGNOSIS — Z87891 Personal history of nicotine dependence: Secondary | ICD-10-CM | POA: Diagnosis not present

## 2023-08-24 DIAGNOSIS — Z79899 Other long term (current) drug therapy: Secondary | ICD-10-CM | POA: Insufficient documentation

## 2023-08-24 DIAGNOSIS — I959 Hypotension, unspecified: Principal | ICD-10-CM | POA: Diagnosis present

## 2023-08-24 DIAGNOSIS — Z95810 Presence of automatic (implantable) cardiac defibrillator: Secondary | ICD-10-CM | POA: Insufficient documentation

## 2023-08-24 DIAGNOSIS — I479 Paroxysmal tachycardia, unspecified: Secondary | ICD-10-CM

## 2023-08-24 DIAGNOSIS — Z7984 Long term (current) use of oral hypoglycemic drugs: Secondary | ICD-10-CM | POA: Insufficient documentation

## 2023-08-24 DIAGNOSIS — G4733 Obstructive sleep apnea (adult) (pediatric): Secondary | ICD-10-CM | POA: Diagnosis not present

## 2023-08-24 DIAGNOSIS — R570 Cardiogenic shock: Secondary | ICD-10-CM | POA: Diagnosis not present

## 2023-08-24 DIAGNOSIS — R42 Dizziness and giddiness: Secondary | ICD-10-CM | POA: Diagnosis not present

## 2023-08-24 DIAGNOSIS — R0989 Other specified symptoms and signs involving the circulatory and respiratory systems: Secondary | ICD-10-CM | POA: Diagnosis not present

## 2023-08-24 DIAGNOSIS — Z7901 Long term (current) use of anticoagulants: Secondary | ICD-10-CM | POA: Diagnosis not present

## 2023-08-24 DIAGNOSIS — Z7689 Persons encountering health services in other specified circumstances: Secondary | ICD-10-CM | POA: Diagnosis not present

## 2023-08-24 DIAGNOSIS — I5042 Chronic combined systolic (congestive) and diastolic (congestive) heart failure: Secondary | ICD-10-CM | POA: Diagnosis not present

## 2023-08-24 DIAGNOSIS — I2510 Atherosclerotic heart disease of native coronary artery without angina pectoris: Secondary | ICD-10-CM | POA: Diagnosis not present

## 2023-08-24 LAB — CBC WITH DIFFERENTIAL/PLATELET
Abs Immature Granulocytes: 0.03 10*3/uL (ref 0.00–0.07)
Basophils Absolute: 0.1 10*3/uL (ref 0.0–0.1)
Basophils Relative: 1 %
Eosinophils Absolute: 0.3 10*3/uL (ref 0.0–0.5)
Eosinophils Relative: 2 %
HCT: 44.8 % (ref 36.0–46.0)
Hemoglobin: 14.9 g/dL (ref 12.0–15.0)
Immature Granulocytes: 0 %
Lymphocytes Relative: 15 %
Lymphs Abs: 1.8 10*3/uL (ref 0.7–4.0)
MCH: 29.2 pg (ref 26.0–34.0)
MCHC: 33.3 g/dL (ref 30.0–36.0)
MCV: 87.7 fL (ref 80.0–100.0)
Monocytes Absolute: 0.9 10*3/uL (ref 0.1–1.0)
Monocytes Relative: 7 %
Neutro Abs: 8.7 10*3/uL — ABNORMAL HIGH (ref 1.7–7.7)
Neutrophils Relative %: 75 %
Platelets: 342 10*3/uL (ref 150–400)
RBC: 5.11 MIL/uL (ref 3.87–5.11)
RDW: 13.5 % (ref 11.5–15.5)
WBC: 11.7 10*3/uL — ABNORMAL HIGH (ref 4.0–10.5)
nRBC: 0 % (ref 0.0–0.2)

## 2023-08-24 LAB — COMPREHENSIVE METABOLIC PANEL WITH GFR
ALT: 22 U/L (ref 0–44)
AST: 29 U/L (ref 15–41)
Albumin: 4.6 g/dL (ref 3.5–5.0)
Alkaline Phosphatase: 138 U/L — ABNORMAL HIGH (ref 38–126)
Anion gap: 10 (ref 5–15)
BUN: 25 mg/dL — ABNORMAL HIGH (ref 6–20)
CO2: 27 mmol/L (ref 22–32)
Calcium: 9.5 mg/dL (ref 8.9–10.3)
Chloride: 100 mmol/L (ref 98–111)
Creatinine, Ser: 1.31 mg/dL — ABNORMAL HIGH (ref 0.44–1.00)
GFR, Estimated: 48 mL/min — ABNORMAL LOW (ref >60)
Glucose, Bld: 58 mg/dL — ABNORMAL LOW (ref 70–99)
Potassium: 4.9 mmol/L (ref 3.5–5.1)
Sodium: 137 mmol/L (ref 135–145)
Total Bilirubin: 0.3 mg/dL (ref 0.0–1.2)
Total Protein: 7.3 g/dL (ref 6.5–8.1)

## 2023-08-24 LAB — URINALYSIS, ROUTINE W REFLEX MICROSCOPIC
Bacteria, UA: NONE SEEN
Bilirubin Urine: NEGATIVE
Glucose, UA: 500 mg/dL — AB
Hgb urine dipstick: NEGATIVE
Ketones, ur: NEGATIVE mg/dL
Leukocytes,Ua: NEGATIVE
Nitrite: NEGATIVE
Protein, ur: NEGATIVE mg/dL
Specific Gravity, Urine: 1.008 (ref 1.005–1.030)
pH: 6.5 (ref 5.0–8.0)

## 2023-08-24 LAB — TROPONIN T, HIGH SENSITIVITY
Troponin T High Sensitivity: <15 ng/L (ref <19)
Troponin T High Sensitivity: <15 ng/L (ref <19)

## 2023-08-24 LAB — MAGNESIUM: Magnesium: 2.5 mg/dL — ABNORMAL HIGH (ref 1.7–2.4)

## 2023-08-24 LAB — PRO BRAIN NATRIURETIC PEPTIDE: Pro Brain Natriuretic Peptide: 120 pg/mL (ref <300.0)

## 2023-08-24 LAB — LACTIC ACID, PLASMA: Lactic Acid, Venous: 0.5 mmol/L (ref 0.5–1.9)

## 2023-08-24 LAB — CBG MONITORING, ED: Glucose-Capillary: 71 mg/dL (ref 70–99)

## 2023-08-24 LAB — SERIAL MONITORING

## 2023-08-24 MED ORDER — PROMETHAZINE HCL 25 MG PO TABS
25.0000 mg | ORAL_TABLET | Freq: Once | ORAL | Status: AC
  Administered 2023-08-24: 25 mg via ORAL
  Filled 2023-08-24: qty 1

## 2023-08-24 MED ORDER — SODIUM CHLORIDE 0.9 % IV BOLUS: 1000.0000 mL | Freq: Once | INTRAVENOUS | Status: DC

## 2023-08-24 MED ORDER — FAMOTIDINE 20 MG PO TABS
20.0000 mg | ORAL_TABLET | Freq: Once | ORAL | Status: AC
  Administered 2023-08-24: 20 mg via ORAL
  Filled 2023-08-24: qty 1

## 2023-08-24 MED ORDER — CETIRIZINE HCL 5 MG/5ML PO SOLN
10.0000 mg | Freq: Once | ORAL | Status: AC
  Administered 2023-08-24: 10 mg via ORAL
  Filled 2023-08-24: qty 10

## 2023-08-24 MED ORDER — APIXABAN 2.5 MG PO TABS
5.0000 mg | ORAL_TABLET | Freq: Once | ORAL | Status: AC
  Administered 2023-08-24: 5 mg via ORAL
  Filled 2023-08-24: qty 2

## 2023-08-24 MED ORDER — ALPRAZOLAM 0.5 MG PO TABS
2.0000 mg | ORAL_TABLET | Freq: Once | ORAL | Status: AC
Start: 2023-08-24 — End: 2023-08-24
  Administered 2023-08-24: 2 mg via ORAL
  Filled 2023-08-24: qty 4

## 2023-08-24 MED ORDER — NOREPINEPHRINE 4 MG/250ML-% IV SOLN
0.0000 ug/min | INTRAVENOUS | Status: DC
  Administered 2023-08-24: 5 ug/min via INTRAVENOUS
  Administered 2023-08-24: 3 ug/min via INTRAVENOUS
  Administered 2023-08-24: 7 ug/min via INTRAVENOUS
  Administered 2023-08-24: 2 ug/min via INTRAVENOUS
  Administered 2023-08-24: 8 ug/min via INTRAVENOUS
  Administered 2023-08-24: 6 ug/min via INTRAVENOUS
  Administered 2023-08-24: 4 ug/min via INTRAVENOUS
  Filled 2023-08-24: qty 250

## 2023-08-24 MED ORDER — SODIUM CHLORIDE 0.9 % IV BOLUS
500.0000 mL | Freq: Once | INTRAVENOUS | Status: AC
  Administered 2023-08-24: 500 mL via INTRAVENOUS

## 2023-08-24 NOTE — ED Notes (Signed)
 Pt given shasta cola and graham crackers. Tolerated well.

## 2023-08-24 NOTE — ED Provider Notes (Signed)
 [place] EMERGENCY DEPARTMENT AT [facility] Provider Note   CSN: [id number] Arrival date & time: 08/24/23  1338     History  Chief Complaint  Patient presents with   Hypotension   Dizziness    Christine [name] is a 58 y.o. female.  Patient is a 58 year old female with a past medical history of CHF with reduced EF and ICD in place, Crohn's, CAD presenting to the emergency department with dizziness.  The patient states that for the last 2 days she has been feeling lightheaded and dizzy.  She states that she did have a headache the last 2 days that is resolved today but the dizziness continued today.  She states that she has been measuring her blood pressure at home that normally runs in the 90s systolic at baseline but that her diastolics have been very low in the 30s to 40s this morning.  She states that she called her cardiology clinic who recommended that she come to the ER to be evaluated.  She states that she does have a follow-up appointment with them on Tuesday.  She denies any recent vomiting, mild diarrhea but reports this is at her baseline with her Crohn's.  She denies any black or bloody stools.  She denies any recent medication changes.  The history is provided by the patient.  Dizziness      Home Medications Prior to Admission medications   Medication Sig Start Date End Date Taking? Authorizing Provider  Accu-Chek Softclix Lancets lancets Use as instructed.  Check blood sugar daily before breakfast. 11/02/22   Lawrance Presume, MD  acetaminophen  (TYLENOL ) 500 MG tablet Take 1,000 mg by mouth 3 (three) times daily as needed for moderate pain or headache.    [provider]  ALPRAZolam  (XANAX ) 1 MG tablet Take 2 mg by mouth 3 (three) times daily.    [provider]  alum & mag hydroxide-simeth (MAALOX/MYLANTA) 200-200-20 MG/5ML suspension Take 30 mLs by mouth every 4 (four) hours as needed for indigestion or heartburn. 03/02/22   Atway, Rayann  N, DO  apixaban  (ELIQUIS ) 5 MG TABS tablet Take 1 tablet (5 mg total) by mouth 2 (two) times daily. 11/05/22   Bensimhon, Rheta Celestine, MD  atorvastatin  (LIPITOR ) 80 MG tablet Take 1 tablet (80 mg total) by mouth daily. 01/28/23   Darlis Eisenmenger, MD  Blood Glucose Monitoring Suppl (ACCU-CHEK GUIDE) w/Device KIT Check blood sugar once daily before breakfast 11/02/22   Lawrance Presume, MD  buPROPion  (WELLBUTRIN  XL) 150 MG 24 hr tablet Take 150 mg by mouth daily.    [provider]  cetirizine  (ZYRTEC  ALLERGY ) 10 MG tablet Take 1 tablet (10 mg total) by mouth 2 (two) times daily. 04/22/23   Trudy Fusi, DO  cyclobenzaprine  (FLEXERIL ) 10 MG tablet Take 10 mg by mouth 3 (three) times daily as needed for muscle spasms.    [provider]  dapagliflozin  propanediol (FARXIGA ) 10 MG TABS tablet Take 1 tablet (10 mg total) by mouth daily. 08/23/23   Bensimhon, Rheta Celestine, MD  diphenoxylate -atropine  (LOMOTIL ) 2.5-0.025 MG tablet Take 2 tablets by mouth 4 (four) times daily as needed for diarrhea/loose stools. 08/05/23   Cirigliano, Vito V, DO  diphenoxylate -atropine  (LOMOTIL ) 2.5-0.025 MG tablet Take 2 tablets by mouth 4 (four) times daily as needed for diarrhea/ loose stools. 08/05/23     Eszopiclone  3 MG TABS Take 1 tablet (3 mg total) by mouth at bedtime. Take immediately before bedtime 08/19/23   Antonio Baumgarten,  NP  famotidine  (PEPCID ) 20 MG tablet Take 1 tablet (20 mg total) by mouth 2 (two) times daily. 04/22/23   Trudy Fusi, DO  feeding supplement (ENSURE ENLIVE / ENSURE PLUS) LIQD Take 237 mLs by mouth 2 (two) times daily between meals. 03/03/22   Atway, Rayann N, DO  Fezolinetant  (VEOZAH ) 45 MG TABS Take 1 tablet (45 mg total) by mouth daily. 08/02/23   Lawrance Presume, MD  fluticasone  (FLONASE ) 50 MCG/ACT nasal spray Place 1-2 sprays into both nostrils daily as needed (nasal congestion). 06/24/23   Trudy Fusi, DO  furosemide  (LASIX ) 20 MG tablet Take 4 tablets (80 mg total) by mouth 2 (two)  times daily. 07/09/23   Ruddy Corral M, PA-C  glucose blood test strip Use as instructed to check blood sugar daily before breakfast 06/28/23   Lawrance Presume, MD  ivabradine  (CORLANOR ) 5 MG TABS tablet Take 1/2 tablet (2.5 mg total) by mouth 2 (two) times daily. 06/20/23   Sheryl Donna, NP  lipase/protease/amylase (CREON ) 36000 UNITS CPEP capsule Take 1 capsule (36,000 Units total) by mouth in the morning, at noon, and at bedtime. 07/25/23   Lawrance Presume, MD  metolazone  (ZAROXOLYN ) 2.5 MG tablet Take 1 tablet (2.5 mg total) by mouth 2 (two) times a week. 08/08/23   Ruddy Corral M, PA-C  metoprolol  succinate (TOPROL -XL) 25 MG 24 hr tablet Take 1/2 tablet (12.5 mg total) by mouth daily. 05/20/23   Lawrance Presume, MD  metroNIDAZOLE  (METROGEL ) 1 % gel Apply 1 Application topically daily as needed (rash).    [provider]  Multiple Vitamins-Minerals (MULTIVITAMIN WITH MINERALS) tablet Take 1 tablet by mouth daily.    [provider]  oxymetazoline (AFRIN) 0.05 % nasal spray Place 1-2 sprays into both nostrils 2 (two) times daily as needed for congestion.    [provider]  potassium chloride  SA (KLOR-CON  M) 20 MEQ tablet Take 6 tablets (120 mEq total) by mouth 3 (three) times daily. Patient taking differently: Take 100 mEq by mouth 3 (three) times daily. 06/27/23   Arleene Belt, PA-C  promethazine  (PHENERGAN ) 25 MG tablet Take 1 tablet (25 mg total) by mouth every 6 (six) to 8 (eight) hours for nausea or vomiting. 08/23/23   Cirigliano, Vito V, DO  sitaGLIPtin  (JANUVIA ) 50 MG tablet Take 1 tablet (50 mg total) by mouth daily. 05/27/23   Lawrance Presume, MD  spironolactone  (ALDACTONE ) 25 MG tablet Take 1 tablet (25 mg total) by mouth daily. 11/30/22   Bensimhon, Rheta Celestine, MD  triamcinolone  cream (KENALOG ) 0.1 % Apply 1 Application topically daily as needed (irritation).    [provider]  valACYclovir  (VALTREX ) 500 MG tablet Take 1 tablet  (500 mg total) by mouth 2 (two) times daily. Patient taking differently: Take 500 mg by mouth 2 (two) times daily as needed (outbreaks). 05/20/23   Lawrance Presume, MD  vedolizumab  (ENTYVIO ) 300 MG injection Inject 300 mg into the vein every 2 (two) months.    [provider]  Vitamin D , Ergocalciferol , (DRISDOL ) 1.25 MG (50000 UNIT) CAPS capsule Take 1 capsule (50,000 Units total) by mouth every 7 (seven) days. 07/29/23   Wellington Half, FNP      Allergies    Gabapentin, Sulfa antibiotics, Erythromycin, Tramadol, Ibuprofen, and Tape    Review of Systems   Review of Systems  Neurological:  Positive for dizziness.    Physical Exam Updated Vital Signs BP (!) 88/57 (BP Location: Right Arm)  Pulse 70   Temp 98.1 F (36.7 C) (Oral)   Resp 16   Ht 5\' 10"  (1.778 m)   Wt 83.9 kg   SpO2 100%   BMI 26.54 kg/m  Physical Exam Vitals and nursing note reviewed.  Constitutional:      General: She is not in acute distress.    Appearance: Normal appearance.  HENT:     Head: Normocephalic and atraumatic.     Nose: Nose normal.     Mouth/Throat:     Mouth: Mucous membranes are moist.     Pharynx: Oropharynx is clear.  Eyes:     Extraocular Movements: Extraocular movements intact.     Conjunctiva/sclera: Conjunctivae normal.     Pupils: Pupils are equal, round, and reactive to light.     Comments: No nystagmus  Cardiovascular:     Rate and Rhythm: Normal rate and regular rhythm.     Heart sounds: Normal heart sounds.  Pulmonary:     Effort: Pulmonary effort is normal.     Breath sounds: Normal breath sounds.  Abdominal:     General: Abdomen is flat.     Palpations: Abdomen is soft.     Tenderness: There is no abdominal tenderness.  Musculoskeletal:        General: Normal range of motion.     Cervical back: Normal range of motion.     Right lower leg: No edema.     Left lower leg: No edema.  Skin:    General: Skin is warm and dry.  Neurological:     General: No  focal deficit present.     Mental Status: She is alert and oriented to person, place, and time.     Sensory: No sensory deficit.     Motor: No weakness.     Coordination: Coordination normal.  Psychiatric:        Mood and Affect: Mood normal.     ED Results / Procedures / Treatments   Labs (all labs ordered are listed, but only abnormal results are displayed) Labs Reviewed  CBC WITH DIFFERENTIAL/PLATELET - Abnormal; Notable for the following components:      Result Value   WBC 11.7 (*)    Neutro Abs 8.7 (*)    All other components within normal limits  COMPREHENSIVE METABOLIC PANEL WITH GFR  MAGNESIUM     EKG EKG Interpretation Date/Time:  Saturday Aug 24 2023 13:49:02 EDT Ventricular Rate:  68 PR Interval:  172 QRS Duration:  106 QT Interval:  418 QTC Calculation: 445 R Axis:   54  Text Interpretation: Sinus rhythm Inferior infarct, old Anterior infarct, old Abnrm T, consider ischemia, anterolateral lds No significant change since last tracing Confirmed by Celesta Coke ([id number]) on 08/24/2023 2:01:49 PM  Radiology No results found.  Procedures Procedures    Medications Ordered in ED Medications  sodium chloride  0.9 % bolus 500 mL (has no administration in time range)  promethazine  (PHENERGAN ) tablet 25 mg (25 mg Oral Given 08/24/23 1416)    ED Course/ Medical Decision Making/ A&P Clinical Course as of 08/24/23 1504  Sat Aug 24, 2023  1503 Patient signed out to Dr. Delana Favors pending labs and device interrogation and reassessment. [VK]    Clinical Course User Index [VK] Kingsley, Porscha Axley K, DO                                 Medical Decision Making This patient presents to  the ED with chief complaint(s) of dizziness, hypotension with pertinent past medical history of CHF w/ ICD in place, CAD, hypotension, Crohn's which further complicates the presenting complaint. The complaint involves an extensive differential diagnosis and also carries with it a high risk of  complications and morbidity.    The differential diagnosis includes arrhythmia, anemia, dehydration, electrolyte abnormality, orthostatic hypotension, medication side effect  Additional history obtained: Additional history obtained from N/A Records reviewed outpatient cardiology and GI records  ED Course and Reassessment: On patient's arrival she is hypotensive to the 80s over 50s to 60s otherwise hemodynamically stable in no acute distress.  Patient appears euvolemic to dry on exam and will be given gentle fluid repletion.  She will have labs including electrolytes performed and will have her ICD interrogated and will be closely reassessed.  Independent labs interpretation:  -Pending  Independent visualization of imaging: - N/A   Amount and/or Complexity of Data Reviewed Labs: ordered.  Risk Prescription drug management.          Final Clinical Impression(s) / ED Diagnoses Final diagnoses:  None    Rx / DC Orders ED Discharge Orders     None         Kingsley, Stelios Kirby K, DO 08/24/23 1504

## 2023-08-24 NOTE — H&P (Incomplete)
 Cardiology Consultation   Patient ID: Christine [name] MRN: [id number]; DOB: 21-Apr-1965  Admit date: 08/24/2023 Date of Consult: 08/24/2023  PCP:  Lawrance Presume, MD   [facility] Providers Cardiologist:  Maudine Sos, MD  Electrophysiologist:  Richardo Chandler, MD  Advanced Heart Failure:  Jules Oar, MD  Sleep Medicine:  Gaylyn Keas, MD  { Click here to update MD or APP on Care Team, Refresh:1}     Patient Profile:   Christine [name] is a 58 y.o. 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  History of Present Illness:   Christine [name] is a 58 y.o. female with a hx of dilated cardiomyopathy, single-vessel CAD with mid LAD complete occlusion, nonviable myocardium and infarct 830 including inferior infarction, status post ICD who is being seen 08/24/2023 for the evaluation of dizziness, low blood pressure   Patient reached out to aftercare hours due to low blood pressure, dizziness, blood pressure mostly runs in 90s/60s but today was rather low predominantly diastolic.  At the time of presentation to the ER her blood pressure was 82/63 mmHg.  Patient was evaluated by ER physician and got a total of 2 L fluid in the [facility] and then cardiology was contacted. Current blood pressures still low 81/55 mmHg, MAP 64 mmHg, creatinine 1.31 baseline creatinine is around 1.4, NT proBNP is negative troponin x 2 negative EKG shows inferior and anterior infarct, old, T wave inversion anteriorly, normal sinus rhythm.  Upon discussion with me I had concerns for possible cardiogenic shock recommended checking lactic acid, if continue to have low blood pressure indicated to start low-dose norepinephrine and immediately transferred to [facility] for further evaluation. Chest x-ray does not show pulmonary edema  She recently had a right heart cath on 07/12/2023, RA was 5, RV 19/18, wedge was 8, index was 1.96, papi was 1.8.  After 300 cc of IV fluid which improved to 10, index improved to 2.1,. She had low cardiac output and was dry  RHC 07/04/2023 Findings:   RA = 5 RV = 19/8 PA = 20/11 (16) PCW = 8 Fick cardiac output/index = 3.96/1.96 Thermo CO/CI = 4.00/1.98 PVR = 2.0 WU Ao sat = 97% PA sat = 65% PAPi = 1.8   After 300cc fluid bolus   PA = 28/11 (20) PW = 10 Fick CO/CI = 4.2/2.1 Thermo CO/CI = 5.1/2.5 PA sat = 67%   Assessment: 1. Volume depletion with low cardiac output. Improved with volume resuscitation   Plan/Discussion:   Suspect she is keeping herself too dry with diuretics. Weight today was 186. Suggested gaol weight 189-192.    Echo 06/18/2023 IMPRESSIONS     1. Left ventricular ejection fraction, by estimation, is 30 to 35%. Left  ventricular ejection fraction by 2D MOD biplane is 34.1 %. The left  ventricle has moderately decreased function. The left ventricle  demonstrates regional wall motion abnormalities  (see scoring diagram/findings for description). The left ventricular  internal cavity size was moderately dilated. Left ventricular diastolic  parameters are indeterminate.   2. Right ventricular systolic function is low normal. The right  ventricular size is normal. There is normal pulmonary artery systolic  pressure. The estimated right ventricular systolic pressure is 18.2 mmHg.   3. The mitral valve is normal  in structure. Trivial mitral valve  regurgitation.   4. The aortic valve is tricuspid. Aortic valve regurgitation is not  visualized.   5. There is mild dilatation of the ascending aorta, measuring 40 mm.   6. The inferior vena cava is normal in size with greater than 50%  respiratory variability, suggesting right atrial pressure of 3 mmHg.     R/LHC (10/22): 1v CAD  occluded mid LAD PCWP 27,  Fick 4.7/2.  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 No viability. RV okay. Not sure how to explain inferior defects on cMRI  - Echo (05/11/21) EF < 20% RV ok - CPX 1/23 with very mild HF limitation:  Peak VO2: 18.8 (90% predicted peak VO2) VE/VCO2 slope: 33 Peak RER: 1.14  - s/p BosSCI ICD 4/23.  Past Medical History:  Diagnosis Date   Allergy     Anxiety 01/20/2021   CAD in native artery 08/24/2021   CHF (congestive heart failure) (HCC)    Chronic combined systolic and diastolic heart failure (HCC) 08/24/2021   Complication of anesthesia    Crohn's colitis (HCC)    Depression    Hyperlipidemia    Myocardial infarction [facility])    Presence of cardiac defibrillator 07/2021   Shortness of breath 01/20/2021   Sleep apnea 07/2021   Tobacco abuse 01/20/2021   Urticaria     Past Surgical History:  Procedure Laterality Date   ABDOMINAL HYSTERECTOMY     BIOPSY  02/26/2022   Procedure: BIOPSY;  Surgeon: Annis Kinder, DO;  Location: [facility];  Service: Gastroenterology;;   CARDIAC CATHETERIZATION     CHOLECYSTECTOMY     COLONOSCOPY WITH PROPOFOL  N/A 02/26/2022   Procedure: COLONOSCOPY WITH PROPOFOL ;  Surgeon: Annis Kinder, DO;  Location: [facility];  Service: Gastroenterology;  Laterality: N/A;   ESOPHAGOGASTRODUODENOSCOPY (EGD) WITH PROPOFOL  N/A 02/26/2022   Procedure: ESOPHAGOGASTRODUODENOSCOPY (EGD) WITH PROPOFOL ;  Surgeon: Annis Kinder, DO;  Location: [facility];  Service: Gastroenterology;  Laterality: N/A;   ICD IMPLANT N/A 07/21/2021   Procedure: ICD IMPLANT;  Surgeon: Verona Goodwill, MD;  Location: [facility] INVASIVE CV LAB;  Service: Cardiovascular;  Laterality: N/A;   NECK SURGERY     OVARIAN CYST SURGERY     POLYPECTOMY  02/26/2022   Procedure: POLYPECTOMY;  Surgeon: Annis Kinder, DO;  Location: [facility];   Service: Gastroenterology;;   RIGHT HEART CATH N/A 07/12/2023   Procedure: RIGHT HEART CATH;  Surgeon: Mardell Shade, MD;  Location: [facility] INVASIVE CV LAB;  Service: Cardiovascular;  Laterality: N/A;   RIGHT/LEFT HEART CATH AND CORONARY ANGIOGRAPHY N/A 01/23/2021   Procedure: RIGHT/LEFT HEART CATH AND CORONARY ANGIOGRAPHY;  Surgeon: Mardell Shade, MD;  Location: [facility] INVASIVE CV LAB;  Service: Cardiovascular;  Laterality: N/A;   TONSILLECTOMY       Home Medications:  Prior to Admission medications   Medication Sig Start Date End Date Taking? Authorizing Provider  Accu-Chek Softclix Lancets lancets Use as instructed.  Check blood sugar daily before breakfast. 11/02/22   Lawrance Presume, MD  acetaminophen  (TYLENOL ) 500 MG tablet Take 1,000 mg by mouth 3 (three) times daily as needed for moderate pain or headache.    [provider]  ALPRAZolam  (XANAX ) 1 MG tablet Take 2 mg by mouth 3 (three) times daily.    [provider]  alum & mag hydroxide-simeth (MAALOX/MYLANTA) 200-200-20 MG/5ML suspension Take 30 mLs by mouth every 4 (  four) hours as needed for indigestion or heartburn. 03/02/22   Atway, Rayann N, DO  apixaban  (ELIQUIS ) 5 MG TABS tablet Take 1 tablet (5 mg total) by mouth 2 (two) times daily. 11/05/22   Bensimhon, Rheta Celestine, MD  atorvastatin  (LIPITOR ) 80 MG tablet Take 1 tablet (80 mg total) by mouth daily. 01/28/23   Darlis Eisenmenger, MD  Blood Glucose Monitoring Suppl (ACCU-CHEK GUIDE) w/Device KIT Check blood sugar once daily before breakfast 11/02/22   Lawrance Presume, MD  buPROPion  (WELLBUTRIN  XL) 150 MG 24 hr tablet Take 150 mg by mouth daily.    [provider]  cetirizine  (ZYRTEC  ALLERGY ) 10 MG tablet Take 1 tablet (10 mg total) by mouth 2 (two) times daily. 04/22/23   Trudy Fusi, DO  cyclobenzaprine  (FLEXERIL ) 10 MG tablet Take 10 mg by mouth 3 (three) times daily as needed for muscle spasms.    [provider]  dapagliflozin  propanediol  (FARXIGA ) 10 MG TABS tablet Take 1 tablet (10 mg total) by mouth daily. 08/23/23   Bensimhon, Rheta Celestine, MD  diphenoxylate -atropine  (LOMOTIL ) 2.5-0.025 MG tablet Take 2 tablets by mouth 4 (four) times daily as needed for diarrhea/loose stools. 08/05/23   Cirigliano, Vito V, DO  diphenoxylate -atropine  (LOMOTIL ) 2.5-0.025 MG tablet Take 2 tablets by mouth 4 (four) times daily as needed for diarrhea/ loose stools. 08/05/23     Eszopiclone  3 MG TABS Take 1 tablet (3 mg total) by mouth at bedtime. Take immediately before bedtime 08/19/23   Antonio Baumgarten, NP  famotidine  (PEPCID ) 20 MG tablet Take 1 tablet (20 mg total) by mouth 2 (two) times daily. 04/22/23   Trudy Fusi, DO  feeding supplement (ENSURE ENLIVE / ENSURE PLUS) LIQD Take 237 mLs by mouth 2 (two) times daily between meals. 03/03/22   Atway, Rayann N, DO  Fezolinetant  (VEOZAH ) 45 MG TABS Take 1 tablet (45 mg total) by mouth daily. 08/02/23   Lawrance Presume, MD  fluticasone  (FLONASE ) 50 MCG/ACT nasal spray Place 1-2 sprays into both nostrils daily as needed (nasal congestion). 06/24/23   Trudy Fusi, DO  furosemide  (LASIX ) 20 MG tablet Take 4 tablets (80 mg total) by mouth 2 (two) times daily. 07/09/23   Ruddy Corral M, PA-C  glucose blood test strip Use as instructed to check blood sugar daily before breakfast 06/28/23   Lawrance Presume, MD  ivabradine  (CORLANOR ) 5 MG TABS tablet Take 1/2 tablet (2.5 mg total) by mouth 2 (two) times daily. 06/20/23   Sheryl Donna, NP  lipase/protease/amylase (CREON ) 36000 UNITS CPEP capsule Take 1 capsule (36,000 Units total) by mouth in the morning, at noon, and at bedtime. 07/25/23   Lawrance Presume, MD  metolazone  (ZAROXOLYN ) 2.5 MG tablet Take 1 tablet (2.5 mg total) by mouth 2 (two) times a week. 08/08/23   Horace Lye, PA-C  metoprolol  succinate (TOPROL -XL) 25 MG 24 hr tablet Take 1/2 tablet (12.5 mg total) by mouth daily. 05/20/23   Lawrance Presume, MD  metroNIDAZOLE  (METROGEL ) 1 % gel Apply 1  Application topically daily as needed (rash).    [provider]  Multiple Vitamins-Minerals (MULTIVITAMIN WITH MINERALS) tablet Take 1 tablet by mouth daily.    [provider]  oxymetazoline (AFRIN) 0.05 % nasal spray Place 1-2 sprays into both nostrils 2 (two) times daily as needed for congestion.    [provider]  potassium chloride  SA (KLOR-CON  M) 20 MEQ tablet Take 6 tablets (120 mEq total) by mouth 3 (  three) times daily. Patient taking differently: Take 100 mEq by mouth 3 (three) times daily. 06/27/23   Arleene Belt, PA-C  promethazine  (PHENERGAN ) 25 MG tablet Take 1 tablet (25 mg total) by mouth every 6 (six) to 8 (eight) hours for nausea or vomiting. 08/23/23   Cirigliano, Vito V, DO  sitaGLIPtin  (JANUVIA ) 50 MG tablet Take 1 tablet (50 mg total) by mouth daily. 05/27/23   Lawrance Presume, MD  spironolactone  (ALDACTONE ) 25 MG tablet Take 1 tablet (25 mg total) by mouth daily. 11/30/22   Bensimhon, Rheta Celestine, MD  triamcinolone  cream (KENALOG ) 0.1 % Apply 1 Application topically daily as needed (irritation).    [provider]  valACYclovir  (VALTREX ) 500 MG tablet Take 1 tablet (500 mg total) by mouth 2 (two) times daily. Patient taking differently: Take 500 mg by mouth 2 (two) times daily as needed (outbreaks). 05/20/23   Lawrance Presume, MD  vedolizumab  (ENTYVIO ) 300 MG injection Inject 300 mg into the vein every 2 (two) months.    [provider]  Vitamin D , Ergocalciferol , (DRISDOL ) 1.25 MG (50000 UNIT) CAPS capsule Take 1 capsule (50,000 Units total) by mouth every 7 (seven) days. 07/29/23   Wellington Half, FNP    Inpatient Medications: Scheduled Meds:  Continuous Infusions:  PRN Meds:   Allergies:    Allergies  Allergen Reactions   Gabapentin Diarrhea   Sulfa Antibiotics Hives, Itching and Rash   Erythromycin Hives   Tramadol Rash and Other (See Comments)    Urinary retention     Ibuprofen Itching   Tape Rash     Social History:   Social History   Socioeconomic History   Marital status: Single    Spouse name: Not on file   Number of children: 0   Years of education: 16   Highest education level: Bachelor's degree (e.g., BA, AB, BS)  Occupational History   Not on file  Tobacco Use   Smoking status: Former    Current packs/day: 0.00    Types: Cigarettes    Quit date: 01/15/2021    Years since quitting: 2.6    Passive exposure: Past   Smokeless tobacco: Never  Vaping Use   Vaping status: Never Used  Substance and Sexual Activity   Alcohol use: Not Currently   Drug use: Never   Sexual activity: Not on file  Other Topics Concern   Not on file  Social History Narrative   Not on file   Social Drivers of Health   Financial Resource Strain: Medium Risk (05/16/2023)   Overall Financial Resource Strain (CARDIA)    Difficulty of Paying Living Expenses: Somewhat hard  Food Insecurity: No Food Insecurity (05/16/2023)   Hunger Vital Sign    Worried About Running Out of Food in the Last Year: Never true    Ran Out of Food in the Last Year: Never true  Transportation Needs: No Transportation Needs (05/16/2023)   PRAPARE - Administrator, Civil Service (Medical): No    Lack of Transportation (Non-Medical): No  Physical Activity: Insufficiently Active (05/16/2023)   Exercise Vital Sign    Days of Exercise per Week: 4 days    Minutes of Exercise per Session: 20 min  Stress: Stress Concern Present (05/16/2023)   [employer] of Occupational Health - Occupational Stress Questionnaire    Feeling of Stress : Very much  Social Connections: Moderately Isolated (05/16/2023)   Social Connection and Isolation Panel [NHANES]    Frequency of Communication with  Friends and Family: More than three times a week    Frequency of Social Gatherings with Friends and Family: More than three times a week    Attends Religious Services: 1 to 4 times per year    Active Member of [employer] or  Organizations: No    Attends Engineer, structural: Not on file    Marital Status: Never married  Intimate Partner Violence: Unknown (07/21/2021)   Received from [employer], [facility]   HITS    Physically Hurt: Not on file    Insult or Talk Down To: Not on file    Threaten Physical Harm: Not on file    Scream or Curse: Not on file    Family History:    Family History  Problem Relation Age of Onset   Eczema Mother    Stroke Mother    Atrial fibrillation Mother    Heart failure Father    Asthma Brother    Stomach cancer Maternal Grandfather    Liver cancer Neg Hx    Esophageal cancer Neg Hx    Colon polyps Neg Hx    Colon cancer Neg Hx    Allergic rhinitis Neg Hx    Angioedema Neg Hx    Urticaria Neg Hx      ROS:  Please see the history of present illness.   All other ROS reviewed and negative.     Physical Exam/Data:   Vitals:   08/24/23 1800 08/24/23 1830 08/24/23 1847 08/24/23 1930  BP:  (!) 80/53 (!) 82/60 (!) 82/63  Pulse:  73 70 65  Resp:  20 18 13   Temp: 98.4 F (36.9 C)     TempSrc:      SpO2:  99% 100% 95%  Weight:      Height:        Intake/Output Summary (Last 24 hours) at 08/24/2023 2052 Last data filed at 08/24/2023 1718 Gross per 24 hour  Intake 500 ml  Output --  Net 500 ml      08/24/2023    1:46 PM 08/15/2023   10:36 AM 07/29/2023   10:12 AM  Last 3 Weights  Weight (lbs) 185 lb 183 lb 8 oz 184 lb 6.4 oz  Weight (kg) 83.915 kg 83.235 kg 83.643 kg     Body mass index is 26.54 kg/m.  General:  Well nourished, well developed, in no acute distress HEENT: normal Neck: no JVD Vascular: No carotid bruits; Distal pulses 2+ bilaterally Cardiac:  normal S1, S2; RRR; no murmur  Lungs:  clear to auscultation bilaterally, no wheezing, rhonchi or rales  Abd: soft, nontender, no hepatomegaly  Ext: no edema Musculoskeletal:  No deformities, BUE and BLE strength normal and equal Skin: warm and dry  Neuro:  CNs 2-12 intact, no focal  abnormalities noted Psych:  Normal affect   EKG:  The EKG was personally reviewed and demonstrates: Old anterior and inferior infarct, normal sinus rhythm Telemetry:  Telemetry was personally reviewed and demonstrates:    Relevant CV Studies: As noted above  Laboratory Data:  High Sensitivity Troponin:  No results for input(s): "TROPONINIHS" in the last 720 hours.   Chemistry Recent Labs  Lab 08/24/23 1454  NA 137  K 4.9  CL 100  CO2 27  GLUCOSE 58*  BUN 25*  CREATININE 1.31*  CALCIUM  9.5  MG 2.5*  GFRNONAA 48*  ANIONGAP 10    Recent Labs  Lab 08/24/23 1454  PROT 7.3  ALBUMIN 4.6  AST 29  ALT 22  ALKPHOS 138*  BILITOT 0.3   Lipids No results for input(s): "CHOL", "TRIG", "HDL", "LABVLDL", "LDLCALC", "CHOLHDL" in the last 168 hours.  Hematology Recent Labs  Lab 08/24/23 1454  WBC 11.7*  RBC 5.11  HGB 14.9  HCT 44.8  MCV 87.7  MCH 29.2  MCHC 33.3  RDW 13.5  PLT 342   Thyroid  No results for input(s): "TSH", "FREET4" in the last 168 hours.  BNP Recent Labs  Lab 08/24/23 1910  PROBNP 120.0    DDimer No results for input(s): "DDIMER" in the last 168 hours.   Radiology/Studies:  No results found.   Assessment and Plan:   Dizziness and, hypotension concern for low output cardiac state, cardiogenic shock Chronic dilated ischemic cardiomyopathy with EF 30 to 35%, echo 06/18/2023.  Status post a [employer] ICD in place CMR 01/03/2021 showed normal bradycardia in the anterior and inferior/infarct territory LV apical thrombus CMR 2022 Ischemic cardiomyopathy, mid LAD occlusion with collaterals, nonobstructive disease in left main and left circumflex Multiple other comorbidities as noted   Plan: --> Admit to stepdown cardiology unit -->   Risk Assessment/Risk Scores:  {Complete the following score calculators/questions to meet required metrics.  Press F2         :[id number]}      [place]  Heart Association (NYHA) Functional Class NYHA Class  III     For questions or updates, please contact [facility] Please consult [email or web address] for contact info under    Signed, Cranston Dk, MD  08/24/2023 8:52 PM

## 2023-08-24 NOTE — ED Triage Notes (Addendum)
 Pt to ED reporting persistent hypotension x 2 days with dizziness, headache and nausea. Hx of hypotension without the dizziness. Hx of CHF, no recent changes to Lasix  prescription.   No fevers. Cardiac catheterization 1 month ago.

## 2023-08-24 NOTE — Telephone Encounter (Signed)
   The patient called the answering service after-hours today with concerns about low blood pressure. She states she runs low typically (90s/60s) though this weekend has noticed her diastolics periodically dropping very low associated with symptoms. She has had a couple instances of blurry vision and dizziness (example readings 104/31,  98/44, 95/56 yesterday then "something over 31, something over 40, then into the 50s" on the bottom number today with systolic in the 90s). No other acute stroke symptoms, chest pain, syncope. She is wondering if she should go to ED. Given symptoms and BP concerns and closure of the office over the weekend, agree this is warranted. She will go to [facility] ED and knows not to drive herself. Sending FYI to Dr. Bensimhon.  Christine [name] N Judi Jaffe, PA-C

## 2023-08-24 NOTE — ED Provider Notes (Addendum)
  Physical Exam  BP (!) 82/63   Pulse 65   Temp 98.4 F (36.9 C)   Resp 13   Ht 5\' 10"  (1.778 m)   Wt 83.9 kg   SpO2 95%   BMI 26.54 kg/m   Physical Exam  Procedures  Procedures  ED Course / MDM   Clinical Course as of 08/24/23 2003  Sat Aug 24, 2023  1503 Patient signed out to Dr. Delana Favors pending labs and device interrogation and reassessment. [VK]    Clinical Course User Index [VK] Kingsley, Victoria K, DO   Medical Decision Making Care assumed at 3 PM.  Patient is here with hypotension.  Patient has heart failure with EF of 20 to 25%.  Patient has an AICD placed recently.  Patient is on Lasix  80 mg twice daily.  Signout pending ICD interrogation and reassessment.  5 pm Patient is still hypotensive after 500 cc bolus.  Will order another 500 cc bolus.  8:04 PM Patient is still hypotensive with a blood pressure in the 80s.  Patient received 2 L normal saline bolus already.  Patient states that she has subjective shortness of breath already.  Troponin is negative and BNP is only 120.  Chest x-ray obtained and there is no obvious pulmonary edema.  Patient has no fever or signs of sepsis.  I discussed with Dr. Jeryl Moris from cardiology.  He is concerned for possible cardiogenic shock.  He recommend lactate and urinalysis.  He states that patient will need to be admitted to progressive bed at [facility] under cardiology service.  He wants to hold off on IV fluids for now.  If she remains hypotensive, he recommend pressors  9:46 PM BP down to the 70s occasionally.  Will start Levophed.   CRITICAL CARE Performed by: Florette Hurry   Total critical care time: 38 minutes  Critical care time was exclusive of separately billable procedures and treating other patients.  Critical care was necessary to treat or prevent imminent or life-threatening deterioration.  Critical care was time spent personally by me on the following activities: development of treatment plan with patient and/or  surrogate as well as nursing, discussions with consultants, evaluation of patient's response to treatment, examination of patient, obtaining history from patient or surrogate, ordering and performing treatments and interventions, ordering and review of laboratory studies, ordering and review of radiographic studies, pulse oximetry and re-evaluation of patient's condition.   Problems Addressed: Cardiogenic shock [facility]): acute illness or injury  Amount and/or Complexity of Data Reviewed Labs: ordered. Decision-making details documented in ED Course. Radiology: ordered and independent interpretation performed. Decision-making details documented in ED Course.  Risk Prescription drug management. Decision regarding hospitalization.          Dalene Duck, MD 08/24/23 Arlyn Lambing    Dalene Duck, MD 08/24/23 2147

## 2023-08-24 NOTE — ED Notes (Signed)
 IT sales professional used to transfer ICD data at this time with confirmation messaged received.

## 2023-08-25 DIAGNOSIS — R570 Cardiogenic shock: Secondary | ICD-10-CM | POA: Diagnosis not present

## 2023-08-25 DIAGNOSIS — I959 Hypotension, unspecified: Secondary | ICD-10-CM | POA: Diagnosis present

## 2023-08-25 LAB — COMPREHENSIVE METABOLIC PANEL WITH GFR
ALT: 20 U/L (ref 0–44)
AST: 19 U/L (ref 15–41)
Albumin: 3.4 g/dL — ABNORMAL LOW (ref 3.5–5.0)
Alkaline Phosphatase: 90 U/L (ref 38–126)
Anion gap: 5 (ref 5–15)
BUN: 17 mg/dL (ref 6–20)
CO2: 25 mmol/L (ref 22–32)
Calcium: 9.1 mg/dL (ref 8.9–10.3)
Chloride: 105 mmol/L (ref 98–111)
Creatinine, Ser: 1.32 mg/dL — ABNORMAL HIGH (ref 0.44–1.00)
GFR, Estimated: 48 mL/min — ABNORMAL LOW (ref >60)
Glucose, Bld: 83 mg/dL (ref 70–99)
Potassium: 4.9 mmol/L (ref 3.5–5.1)
Sodium: 135 mmol/L (ref 135–145)
Total Bilirubin: 0.6 mg/dL (ref 0.0–1.2)
Total Protein: 6.4 g/dL — ABNORMAL LOW (ref 6.5–8.1)

## 2023-08-25 LAB — GLUCOSE, CAPILLARY
Glucose-Capillary: 105 mg/dL — ABNORMAL HIGH (ref 70–99)
Glucose-Capillary: 115 mg/dL — ABNORMAL HIGH (ref 70–99)

## 2023-08-25 LAB — SURGICAL PCR SCREEN
MRSA, PCR: NEGATIVE
Staphylococcus aureus: POSITIVE — AB

## 2023-08-25 LAB — CBC
HCT: 40.9 % (ref 36.0–46.0)
Hemoglobin: 13.6 g/dL (ref 12.0–15.0)
MCH: 29.7 pg (ref 26.0–34.0)
MCHC: 33.3 g/dL (ref 30.0–36.0)
MCV: 89.3 fL (ref 80.0–100.0)
Platelets: 309 10*3/uL (ref 150–400)
RBC: 4.58 MIL/uL (ref 3.87–5.11)
RDW: 13.7 % (ref 11.5–15.5)
WBC: 6.5 10*3/uL (ref 4.0–10.5)
nRBC: 0 % (ref 0.0–0.2)

## 2023-08-25 LAB — LACTIC ACID, PLASMA: Lactic Acid, Venous: 1.4 mmol/L (ref 0.5–1.9)

## 2023-08-25 MED ORDER — LORATADINE 10 MG PO TABS
10.0000 mg | ORAL_TABLET | Freq: Every day | ORAL | Status: DC
  Administered 2023-08-25 – 2023-08-26 (×2): 10 mg via ORAL
  Filled 2023-08-25 (×2): qty 1

## 2023-08-25 MED ORDER — BUPROPION HCL ER (XL) 150 MG PO TB24
150.0000 mg | ORAL_TABLET | Freq: Every day | ORAL | Status: DC
  Administered 2023-08-25 – 2023-08-26 (×2): 150 mg via ORAL
  Filled 2023-08-25 (×2): qty 1

## 2023-08-25 MED ORDER — SODIUM CHLORIDE 0.9% FLUSH
3.0000 mL | Freq: Two times a day (BID) | INTRAVENOUS | Status: DC
  Administered 2023-08-25 – 2023-08-26 (×4): 3 mL via INTRAVENOUS

## 2023-08-25 MED ORDER — ALPRAZOLAM 0.5 MG PO TABS
2.0000 mg | ORAL_TABLET | Freq: Three times a day (TID) | ORAL | Status: DC
  Administered 2023-08-25 – 2023-08-26 (×4): 2 mg via ORAL
  Filled 2023-08-25 (×4): qty 4

## 2023-08-25 MED ORDER — SODIUM CHLORIDE 0.9 % IV SOLN: 250.0000 mL | INTRAVENOUS | Status: AC | PRN

## 2023-08-25 MED ORDER — MUPIROCIN 2 % EX OINT
1.0000 | TOPICAL_OINTMENT | Freq: Two times a day (BID) | CUTANEOUS | Status: DC
  Administered 2023-08-25 – 2023-08-26 (×3): 1 via NASAL
  Filled 2023-08-25 (×2): qty 22

## 2023-08-25 MED ORDER — CHLORHEXIDINE GLUCONATE CLOTH 2 % EX PADS
6.0000 | MEDICATED_PAD | Freq: Every day | CUTANEOUS | Status: DC
  Administered 2023-08-25 – 2023-08-26 (×2): 6 via TOPICAL

## 2023-08-25 MED ORDER — ATORVASTATIN CALCIUM 80 MG PO TABS
80.0000 mg | ORAL_TABLET | Freq: Every day | ORAL | Status: DC
  Administered 2023-08-25 – 2023-08-26 (×2): 80 mg via ORAL
  Filled 2023-08-25 (×2): qty 1

## 2023-08-25 MED ORDER — ORAL CARE MOUTH RINSE: 15.0000 mL | OROMUCOSAL | Status: DC | PRN

## 2023-08-25 MED ORDER — ACETAMINOPHEN 325 MG PO TABS
650.0000 mg | ORAL_TABLET | ORAL | Status: DC | PRN
  Administered 2023-08-25 – 2023-08-26 (×2): 650 mg via ORAL
  Filled 2023-08-25 (×2): qty 2

## 2023-08-25 MED ORDER — PANCRELIPASE (LIP-PROT-AMYL) 36000-114000 UNITS PO CPEP
36000.0000 [IU] | ORAL_CAPSULE | Freq: Three times a day (TID) | ORAL | Status: DC
Start: 2023-08-25 — End: 2023-08-26
  Administered 2023-08-25 – 2023-08-26 (×5): 36000 [IU] via ORAL
  Filled 2023-08-25 (×6): qty 1

## 2023-08-25 MED ORDER — IVABRADINE 2.5 MG HALF TABLET
2.5000 mg | ORAL_TABLET | Freq: Two times a day (BID) | ORAL | Status: DC
  Administered 2023-08-25 – 2023-08-26 (×2): 2.5 mg via ORAL
  Filled 2023-08-25 (×3): qty 1

## 2023-08-25 MED ORDER — FAMOTIDINE 20 MG PO TABS
20.0000 mg | ORAL_TABLET | Freq: Two times a day (BID) | ORAL | Status: DC
  Administered 2023-08-25 – 2023-08-26 (×2): 20 mg via ORAL
  Filled 2023-08-25 (×2): qty 1

## 2023-08-25 MED ORDER — ONDANSETRON HCL 4 MG/2ML IJ SOLN: 4.0000 mg | Freq: Four times a day (QID) | INTRAMUSCULAR | Status: DC | PRN

## 2023-08-25 MED ORDER — SODIUM CHLORIDE 0.9% FLUSH: 3.0000 mL | INTRAVENOUS | Status: DC | PRN

## 2023-08-25 MED ORDER — ZOLPIDEM TARTRATE 5 MG PO TABS
5.0000 mg | ORAL_TABLET | Freq: Every evening | ORAL | Status: DC | PRN
  Administered 2023-08-25: 5 mg via ORAL
  Filled 2023-08-25: qty 1

## 2023-08-25 MED ORDER — APIXABAN 5 MG PO TABS
5.0000 mg | ORAL_TABLET | Freq: Two times a day (BID) | ORAL | Status: DC
  Administered 2023-08-25 – 2023-08-26 (×3): 5 mg via ORAL
  Filled 2023-08-25 (×3): qty 1

## 2023-08-25 NOTE — Plan of Care (Signed)

## 2023-08-25 NOTE — Progress Notes (Signed)
 Patient ID: Christine [name], female   DOB: [birth year], 58 y.o.   MRN: [id number]     Advanced Heart Failure Rounding Note  Cardiologist: Maudine Sos, MD  Chief Complaint: Hypotension Subjective:    Patient is now off norepinephrine, SBP in 90s.  She received 2L IVF in ER.  Troponin negative, pro-BNP 120.   Walked to bathroom without lightheadedness.    Objective:   Weight Range: 85.9 kg Body mass index is 27.17 kg/m.   Vital Signs:   Temp:  [97.7 F (36.5 C)-98.5 F (36.9 C)] 98.5 F (36.9 C) (05/11 1221) Pulse Rate:  [55-75] 67 (05/11 1315) Resp:  [12-37] 16 (05/11 1315) BP: (73-122)/(47-83) 97/70 (05/11 1300) SpO2:  [91 %-100 %] 97 % (05/11 1315) Weight:  [85.9 kg] 85.9 kg (05/11 0145)    Weight change: Filed Weights   08/24/23 1346 08/25/23 0145  Weight: 83.9 kg 85.9 kg    Intake/Output:   Intake/Output Summary (Last 24 hours) at 08/25/2023 1457 Last data filed at 08/25/2023 0200 Gross per 24 hour  Intake 510.04 ml  Output --  Net 510.04 ml      Physical Exam    General:  Well appearing. No resp difficulty HEENT: Normal Neck: Supple. JVP not elevated. Carotids 2+ bilat; no bruits. No lymphadenopathy or thyromegaly appreciated. Cor: PMI nondisplaced. Regular rate & rhythm. No rubs, gallops or murmurs. Lungs: Clear Abdomen: Soft, nontender, nondistended. No hepatosplenomegaly. No bruits or masses. Good bowel sounds. Extremities: No cyanosis, clubbing, rash, edema Neuro: Alert & orientedx3, cranial nerves grossly intact. moves all 4 extremities w/o difficulty. Affect pleasant   Telemetry   NSR 70s (personally reviewed)  Labs    CBC Recent Labs    08/24/23 1454 08/25/23 1256  WBC 11.7* 6.5  NEUTROABS 8.7*  --   HGB 14.9 13.6  HCT 44.8 40.9  MCV 87.7 89.3  PLT 342 309   Basic Metabolic Panel Recent Labs    40/98/11 1454 08/25/23 1256  NA 137 135  K 4.9 4.9  CL 100 105  CO2 27 25  GLUCOSE 58* 83  BUN 25* 17  CREATININE 1.31*  1.32*  CALCIUM  9.5 9.1  MG 2.5*  --    Liver Function Tests Recent Labs    08/24/23 1454 08/25/23 1256  AST 29 19  ALT 22 20  ALKPHOS 138* 90  BILITOT 0.3 0.6  PROT 7.3 6.4*  ALBUMIN 4.6 3.4*   No results for input(s): "LIPASE", "AMYLASE" in the last 72 hours. Cardiac Enzymes No results for input(s): "CKTOTAL", "CKMB", "CKMBINDEX", "TROPONINI" in the last 72 hours.  BNP: BNP (last 3 results) Recent Labs    05/22/23 1453 06/27/23 1419 07/09/23 1506  BNP 75.8 34.5 42.4    ProBNP (last 3 results) Recent Labs    08/24/23 1910  PROBNP 120.0     D-Dimer No results for input(s): "DDIMER" in the last 72 hours. Hemoglobin A1C No results for input(s): "HGBA1C" in the last 72 hours. Fasting Lipid Panel No results for input(s): "CHOL", "HDL", "LDLCALC", "TRIG", "CHOLHDL", "LDLDIRECT" in the last 72 hours. Thyroid  Function Tests No results for input(s): "TSH", "T4TOTAL", "T3FREE", "THYROIDAB" in the last 72 hours.  Invalid input(s): "FREET3"  Other results:   Imaging    DG Chest Port 1 View Result Date: 08/24/2023 CLINICAL DATA:  Dizziness and hypotension x2 days. EXAM: PORTABLE CHEST 1 VIEW COMPARISON:  May 28, 2023 FINDINGS: There is stable single lead ventricular pacer positioning. The heart size and mediastinal contours are within normal limits.  Low lung volumes are noted. Both lungs are clear. The visualized skeletal structures are unremarkable. IMPRESSION: Low lung volumes without acute cardiopulmonary disease. Electronically Signed   By: Virgle Grime M.D.   On: 08/24/2023 21:35     Medications:     Scheduled Medications:  ALPRAZolam   2 mg Oral TID   apixaban   5 mg Oral BID   atorvastatin   80 mg Oral Daily   buPROPion   150 mg Oral Daily   Chlorhexidine  Gluconate Cloth  6 each Topical Daily   lipase/protease/amylase  36,000 Units Oral TID AC   mupirocin ointment  1 Application Nasal BID   sodium chloride  flush  3 mL Intravenous Q12H     Infusions:  sodium chloride      norepinephrine (LEVOPHED) Adult infusion Stopped (08/25/23 0200)    PRN Medications: sodium chloride , acetaminophen , ondansetron  (ZOFRAN ) IV, mouth rinse, sodium chloride  flush   Assessment/Plan   1. Hypotension: Suspect due to hypovolemia.  Patient came to ER with SBP in 80s and lightheadedness.  She was started on norepinephrine and admitted to ICU last night.  She was thought to be dehydrated and was given 2 L NS.  She has now weaned off NE. Lactate is normal.  Creatinine stable.  I think that hypotension was due to hypovolemia, she continues to take Lasix  80 mg po bid and had had some diarrhea recently (this is periodic with her Crohns Disease).  After RHC in 3/25 suggesting hypovolemia, she did not cut back on her Lasix .  - She can start back on Corlanor  2.5 mg bid today, restart Toprol  XL and spironolactone  tomorrow.  - She is going to need to cut back on Lasix  at home, decrease to 40 mg bid at discharge.  - Walk in hall today to make sure she is not orthostatic.  2. Chronic systolic CHF: Echo (3/25) with EF 30-35%, RV low normal function.  Ischemic cardiomyopathy.  GDMT has been limited by low BP.  She has been taking a high dose of Lasix  (80 mg bid) despite known hypovolemia on last RHC.  She is not volume overloaded on exam, lactate is normal.  - As above, will use lower dose of Lasix  40 mg bid when she goes home.  - Restart Corlanor .  - Restart spirnolactone and Toprol  XL tomorrow.  3. H/o LV thrombus: Continue Eliquis .  4. CAD: H/o occluded LAD.  No chest pain, normal troponin.  - Continue statin.  - She is on apixaban  so no ASA.   OK for telemetry, hopefully home tomorrow.   Length of Stay: 1  Peder Bourdon, MD  08/25/2023, 2:57 PM  Advanced Heart Failure Team Pager [phone] (M-F; 7a - 5p)  Please contact [facility] for night-coverage after hours (5p -7a ) and weekends on [email or web address]

## 2023-08-25 NOTE — Plan of Care (Signed)
  Problem: Clinical Measurements: Goal: Ability to maintain clinical measurements within normal limits will improve Outcome: Progressing Goal: Respiratory complications will improve Outcome: Progressing Goal: Cardiovascular complication will be avoided Outcome: Progressing   Problem: Pain Managment: Goal: General experience of comfort will improve and/or be controlled Outcome: Progressing   Problem: Safety: Goal: Ability to remain free from injury will improve Outcome: Progressing   Problem: Skin Integrity: Goal: Risk for impaired skin integrity will decrease Outcome: Progressing

## 2023-08-25 NOTE — H&P (Addendum)
 Cranston Dk, MD Physician Cardiology   H&P    Incomplete   Date of Service: 08/25/23   Incomplete     Expand All Collapse All      Cardiology Consultation    Patient ID: Christine [name] MRN: [id number]; DOB: 05/04/1965   Admit date: 08/24/2023 Date of Consult: 08/24/2023   PCP:  Lawrance Presume, MD              [facility] Providers Cardiologist:  Maudine Sos, MD  Electrophysiologist:  Richardo Chandler, MD  Advanced Heart Failure:  Jules Oar, MD  Sleep Medicine:  Gaylyn Keas, MD         Patient Profile:    Christine [name] is a 58 y.o. 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   History of Present Illness:    Christine [name] is a 57 y.o. 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  At the time of presentation to the ER her blood pressure was 82/63 mmHg.  Patient was evaluated by ER physician and got a total of 2 L fluid in the [facility] and then cardiology was contacted. Current blood pressures still low 81/55 mmHg, MAP 64 mmHg, creatinine 1.31 baseline creatinine is around 1.4, NT proBNP is negative troponin x 2 negative EKG shows inferior and anterior infarct, old, T wave inversion anteriorly, normal sinus rhythm.   Upon discussion with me and ER physician, I had concerns for possible cardiogenic shock recommended checking lactic  acid, if continue to have low blood pressure indicated to start low-dose norepinephrine and immediately transferred to [facility] for further evaluation. Chest x-ray does not show pulmonary edema.  Upon arrival to [facility] cardiology unit, patient is hemodynamically stable, on 6 mcg Levophed.  Blood pressure and MAP is 80s.  She is doing well overall but reports mild dizziness. Patient denies any recent CHF symptoms did not take metolazone  and is on Lasix  80 mg twice daily   She recently had a right heart cath on 07/12/2023, RA was 5, RV 19/18, wedge was 8, index was 1.96, papi was 1.8.  After 300 cc of IV fluid which improved to 10, index improved to 2.1,. She had low cardiac output and was dry   RHC 07/04/2023 Findings:   RA = 5 RV = 19/8 PA = 20/11 (16) PCW = 8 Fick cardiac output/index = 3.96/1.96 Thermo CO/CI = 4.00/1.98 PVR = 2.0 WU Ao sat = 97% PA sat = 65% PAPi = 1.8   After 300cc fluid bolus   PA = 28/11 (20) PW = 10 Fick CO/CI = 4.2/2.1 Thermo CO/CI = 5.1/2.5 PA sat = 67%   Assessment: 1. Volume depletion with low cardiac output. Improved with volume resuscitation   Plan/Discussion:   Suspect she is keeping herself too dry with diuretics. Weight today was 186. Suggested gaol weight 189-192.    Echo 06/18/2023 IMPRESSIONS     1. Left ventricular ejection fraction, by estimation,  is 30 to 35%. Left  ventricular ejection fraction by 2D MOD biplane is 34.1 %. The left  ventricle has moderately decreased function. The left ventricle  demonstrates regional wall motion abnormalities  (see scoring diagram/findings for description). The left ventricular  internal cavity size was moderately dilated. Left ventricular diastolic  parameters are indeterminate.   2. Right ventricular systolic function is low normal. The right  ventricular size is normal. There is normal pulmonary artery systolic  pressure. The estimated right ventricular systolic pressure is 18.2 mmHg.   3.  The mitral valve is normal in structure. Trivial mitral valve  regurgitation.   4. The aortic valve is tricuspid. Aortic valve regurgitation is not  visualized.   5. There is mild dilatation of the ascending aorta, measuring 40 mm.   6. The inferior vena cava is normal in size with greater than 50%  respiratory variability, suggesting right atrial pressure of 3 mmHg.       R/LHC (10/22): 1v CAD occluded mid LAD PCWP 27,  Fick 4.7/2.  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 No viability. RV okay. Not sure how to explain inferior defects on cMRI  - Echo (05/11/21) EF < 20% RV ok - CPX 1/23 with very mild HF limitation:  Peak VO2: 18.8 (90% predicted peak VO2) VE/VCO2 slope: 33 Peak RER: 1.14  - s/p BosSCI ICD 4/23.       Past Medical History:  Diagnosis Date   Allergy      Anxiety 01/20/2021   CAD in native artery 08/24/2021   CHF (congestive heart failure) (HCC)     Chronic combined systolic and diastolic heart failure (HCC) 08/24/2021   Complication of anesthesia     Crohn's colitis (HCC)     Depression     Hyperlipidemia     Myocardial infarction [facility])     Presence of cardiac defibrillator 07/2021   Shortness of breath 01/20/2021   Sleep apnea 07/2021   Tobacco abuse 01/20/2021   Urticaria                 Past Surgical History:  Procedure Laterality Date   ABDOMINAL HYSTERECTOMY       BIOPSY   02/26/2022    Procedure: BIOPSY;  Surgeon: Annis Kinder, DO;  Location: [facility] ENDOSCOPY;  Service: Gastroenterology;;   CARDIAC CATHETERIZATION       CHOLECYSTECTOMY       COLONOSCOPY WITH PROPOFOL  N/A 02/26/2022    Procedure: COLONOSCOPY WITH PROPOFOL ;  Surgeon: Annis Kinder, DO;  Location: [facility]OSCOPY;  Service: Gastroenterology;  Laterality: N/A;   ESOPHAGOGASTRODUODENOSCOPY (EGD) WITH PROPOFOL  N/A 02/26/2022    Procedure: ESOPHAGOGASTRODUODENOSCOPY (EGD)  WITH PROPOFOL ;  Surgeon: Annis Kinder, DO;  Location: [facility] ENDOSCOPY;  Service: Gastroenterology;  Laterality: N/A;   ICD IMPLANT N/A 07/21/2021    Procedure: ICD IMPLANT;  Surgeon: Verona Goodwill, MD;  Location: [facility] INVASIVE CV LAB;  Service: Cardiovascular;  Laterality: N/A;   NECK SURGERY       OVARIAN CYST SURGERY       POLYPECTOMY   02/26/2022    Procedure: POLYPECTOMY;  Surgeon: Annis Kinder, DO;  Location: [facility] ENDOSCOPY;  Service: Gastroenterology;;   RIGHT HEART CATH N/A 07/12/2023    Procedure: RIGHT HEART CATH;  Surgeon: Mardell Shade, MD;  Location: [facility] INVASIVE CV LAB;  Service: Cardiovascular;  Laterality: N/A;   RIGHT/LEFT HEART CATH AND CORONARY ANGIOGRAPHY N/A  01/23/2021    Procedure: RIGHT/LEFT HEART CATH AND CORONARY ANGIOGRAPHY;  Surgeon: Mardell Shade, MD;  Location: [facility] INVASIVE CV LAB;  Service: Cardiovascular;  Laterality: N/A;   TONSILLECTOMY              Home Medications:         Prior to Admission medications   Medication Sig Start Date End Date Taking? Authorizing Provider  Accu-Chek Softclix Lancets lancets Use as instructed.  Check blood sugar daily before breakfast. 11/02/22     Lawrance Presume, MD  acetaminophen  (TYLENOL ) 500 MG tablet Take 1,000 mg by mouth 3 (three) times daily as needed for moderate pain or headache.       [provider]  ALPRAZolam  (XANAX ) 1 MG tablet Take 2 mg by mouth 3 (three) times daily.       [provider]  alum & mag hydroxide-simeth (MAALOX/MYLANTA) 200-200-20 MG/5ML suspension Take 30 mLs by mouth every 4 (four) hours as needed for indigestion or heartburn. 03/02/22     Atway, Rayann N, DO  apixaban  (ELIQUIS ) 5 MG TABS tablet Take 1 tablet (5 mg total) by mouth 2 (two) times daily. 11/05/22     Bensimhon, Rheta Celestine, MD  atorvastatin  (LIPITOR ) 80 MG tablet Take 1 tablet (80 mg total) by mouth daily. 01/28/23     Darlis Eisenmenger, MD  Blood Glucose Monitoring Suppl (ACCU-CHEK GUIDE) w/Device  KIT Check blood sugar once daily before breakfast 11/02/22     Lawrance Presume, MD  buPROPion  (WELLBUTRIN  XL) 150 MG 24 hr tablet Take 150 mg by mouth daily.       [provider]  cetirizine  (ZYRTEC  ALLERGY ) 10 MG tablet Take 1 tablet (10 mg total) by mouth 2 (two) times daily. 04/22/23     Trudy Fusi, DO  cyclobenzaprine  (FLEXERIL ) 10 MG tablet Take 10 mg by mouth 3 (three) times daily as needed for muscle spasms.       [provider]  dapagliflozin  propanediol (FARXIGA ) 10 MG TABS tablet Take 1 tablet (10 mg total) by mouth daily. 08/23/23     Bensimhon, Rheta Celestine, MD  diphenoxylate -atropine  (LOMOTIL ) 2.5-0.025 MG tablet Take 2 tablets by mouth 4 (four) times daily as needed for diarrhea/loose stools. 08/05/23     Cirigliano, Vito V, DO  diphenoxylate -atropine  (LOMOTIL ) 2.5-0.025 MG tablet Take 2 tablets by mouth 4 (four) times daily as needed for diarrhea/ loose stools. 08/05/23        Eszopiclone  3 MG TABS Take 1 tablet (3 mg total) by mouth at bedtime. Take immediately before bedtime 08/19/23     Antonio Baumgarten, NP  famotidine  (PEPCID ) 20 MG tablet Take 1 tablet (20 mg total) by mouth 2 (two) times daily. 04/22/23     Trudy Fusi, DO  feeding supplement (ENSURE ENLIVE / ENSURE PLUS) LIQD Take 237 mLs by mouth 2 (two) times daily between meals. 03/03/22     Atway, Rayann N, DO  Fezolinetant  (VEOZAH ) 45 MG TABS Take 1 tablet (45 mg total) by mouth daily. 08/02/23     Lawrance Presume, MD  fluticasone  (FLONASE ) 50 MCG/ACT nasal spray Place 1-2 sprays into both nostrils daily as needed (nasal congestion). 06/24/23     Trudy Fusi, DO  furosemide  (LASIX ) 20 MG tablet Take 4 tablets (80 mg total) by mouth 2 (two) times daily. 07/09/23     Ruddy Corral M, PA-C  glucose blood test strip Use as instructed to check blood sugar daily before  breakfast 06/28/23     Lawrance Presume, MD  ivabradine  (CORLANOR ) 5 MG TABS tablet Take 1/2 tablet (2.5 mg total) by mouth 2 (two) times daily.  06/20/23     Sheryl Donna, NP  lipase/protease/amylase (CREON ) 36000 UNITS CPEP capsule Take 1 capsule (36,000 Units total) by mouth in the morning, at noon, and at bedtime. 07/25/23     Lawrance Presume, MD  metolazone  (ZAROXOLYN ) 2.5 MG tablet Take 1 tablet (2.5 mg total) by mouth 2 (two) times a week. 08/08/23     Ruddy Corral M, PA-C  metoprolol  succinate (TOPROL -XL) 25 MG 24 hr tablet Take 1/2 tablet (12.5 mg total) by mouth daily. 05/20/23     Lawrance Presume, MD  metroNIDAZOLE  (METROGEL ) 1 % gel Apply 1 Application topically daily as needed (rash).       [provider]  Multiple Vitamins-Minerals (MULTIVITAMIN WITH MINERALS) tablet Take 1 tablet by mouth daily.       [provider]  oxymetazoline (AFRIN) 0.05 % nasal spray Place 1-2 sprays into both nostrils 2 (two) times daily as needed for congestion.       [provider]  potassium chloride  SA (KLOR-CON  M) 20 MEQ tablet Take 6 tablets (120 mEq total) by mouth 3 (three) times daily. Patient taking differently: Take 100 mEq by mouth 3 (three) times daily. 06/27/23     Arleene Belt, PA-C  promethazine  (PHENERGAN ) 25 MG tablet Take 1 tablet (25 mg total) by mouth every 6 (six) to 8 (eight) hours for nausea or vomiting. 08/23/23     Cirigliano, Vito V, DO  sitaGLIPtin  (JANUVIA ) 50 MG tablet Take 1 tablet (50 mg total) by mouth daily. 05/27/23     Lawrance Presume, MD  spironolactone  (ALDACTONE ) 25 MG tablet Take 1 tablet (25 mg total) by mouth daily. 11/30/22     Bensimhon, Rheta Celestine, MD  triamcinolone  cream (KENALOG ) 0.1 % Apply 1 Application topically daily as needed (irritation).       [provider]  valACYclovir  (VALTREX ) 500 MG tablet Take 1 tablet (500 mg total) by mouth 2 (two) times daily. Patient taking differently: Take 500 mg by mouth 2 (two) times daily as needed (outbreaks). 05/20/23     Lawrance Presume, MD  vedolizumab  (ENTYVIO ) 300 MG injection Inject 300 mg into the vein every 2  (two) months.       [provider]  Vitamin D , Ergocalciferol , (DRISDOL ) 1.25 MG (50000 UNIT) CAPS capsule Take 1 capsule (50,000 Units total) by mouth every 7 (seven) days. 07/29/23     Wellington Half, FNP      Inpatient Medications: Scheduled Meds:       Continuous Infusions:       PRN Meds:         Allergies:    Allergies       Allergies  Allergen Reactions   Gabapentin Diarrhea   Sulfa Antibiotics Hives, Itching and Rash   Erythromycin Hives   Tramadol Rash and Other (See Comments)      Urinary retention       Ibuprofen Itching   Tape Rash        Social History:   Social History         Socioeconomic History   Marital status: Single      Spouse name: Not on file   Number of children: 0   Years of education: 16   Highest education level: Bachelor's degree (e.g., BA, AB, BS)  Occupational History   Not on file  Tobacco Use   Smoking status: Former      Current packs/day: 0.00      Types: Cigarettes      Quit date: 01/15/2021      Years since quitting: 2.6      Passive exposure: Past   Smokeless tobacco: Never  Vaping Use   Vaping status: Never Used  Substance and Sexual Activity   Alcohol use: Not Currently   Drug use: Never   Sexual activity: Not on file  Other Topics Concern   Not on file  Social History Narrative   Not on file    Social Drivers of Health        Financial Resource Strain: Medium Risk (05/16/2023)    Overall Financial Resource Strain (CARDIA)     Difficulty of Paying Living Expenses: Somewhat hard  Food Insecurity: No Food Insecurity (05/16/2023)    Hunger Vital Sign     Worried About Running Out of Food in the Last Year: Never true     Ran Out of Food in the Last Year: Never true  Transportation Needs: No Transportation Needs (05/16/2023)    PRAPARE - Therapist, art (Medical): No     Lack of Transportation (Non-Medical): No  Physical Activity: Insufficiently Active (05/16/2023)     Exercise Vital Sign     Days of Exercise per Week: 4 days     Minutes of Exercise per Session: 20 min  Stress: Stress Concern Present (05/16/2023)    [employer] of Occupational Health - Occupational Stress Questionnaire     Feeling of Stress : Very much  Social Connections: Moderately Isolated (05/16/2023)    Social Connection and Isolation Panel [NHANES]     Frequency of Communication with Friends and Family: More than three times a week     Frequency of Social Gatherings with Friends and Family: More than three times a week     Attends Religious Services: 1 to 4 times per year     Active Member of [employer] or Organizations: No     Attends Engineer, structural: Not on file     Marital Status: Never married  Intimate Partner Violence: Unknown (07/21/2021)    Received from [employer], [facility]    HITS     Physically Hurt: Not on file     Insult or Talk Down To: Not on file     Threaten Physical Harm: Not on file     Scream or Curse: Not on file    Family History:          Family History  Problem Relation Age of Onset   Eczema Mother     Stroke Mother     Atrial fibrillation Mother     Heart failure Father     Asthma Brother     Stomach cancer Maternal Grandfather     Liver cancer Neg Hx     Esophageal cancer Neg Hx     Colon polyps Neg Hx     Colon cancer Neg Hx     Allergic rhinitis Neg Hx     Angioedema Neg Hx     Urticaria Neg Hx            ROS:  Please see the history of present illness.    All other ROS reviewed and negative.      Physical Exam/Data:          Vitals:  08/24/23 1800 08/24/23 1830 08/24/23 1847 08/24/23 1930  BP:   (!) 80/53 (!) 82/60 (!) 82/63  Pulse:   73 70 65  Resp:   20 18 13   Temp: 98.4 F (36.9 C)        TempSrc:          SpO2:   99% 100% 95%  Weight:          Height:              Intake/Output Summary (Last 24 hours) at 08/24/2023 2052 Last data filed at 08/24/2023 1718    Gross per 24 hour  Intake  500 ml  Output --  Net 500 ml        08/24/2023    1:46 PM 08/15/2023   10:36 AM 07/29/2023   10:12 AM  Last 3 Weights  Weight (lbs) 185 lb 183 lb 8 oz 184 lb 6.4 oz  Weight (kg) 83.915 kg 83.235 kg 83.643 kg     Body mass index is 26.54 kg/m.  General:  Well nourished, well developed, in no acute distress HEENT: normal Neck: no JVD Vascular: No carotid bruits; Distal pulses 2+ bilaterally Cardiac:  normal S1, S2; RRR; no murmur  Lungs:  clear to auscultation bilaterally, no wheezing, rhonchi or rales  Abd: soft, nontender, no hepatomegaly  Ext: no edema Musculoskeletal:  No deformities, BUE and BLE strength normal and equal Skin: warm and dry  Neuro:  CNs 2-12 intact, no focal abnormalities noted Psych:  Normal affect    EKG:  The EKG was personally reviewed and demonstrates: Old anterior and inferior infarct, normal sinus rhythm Telemetry:  Telemetry was personally reviewed and demonstrates:     Relevant CV Studies: As noted above   Laboratory Data:   High Sensitivity Troponin:   Last Labs  No results for input(s): "TROPONINIHS" in the last 720 hours.     Chemistry Last Labs     Recent Labs  Lab 08/24/23 1454  NA 137  K 4.9  CL 100  CO2 27  GLUCOSE 58*  BUN 25*  CREATININE 1.31*  CALCIUM  9.5  MG 2.5*  GFRNONAA 48*  ANIONGAP 10      Last Labs     Recent Labs  Lab 08/24/23 1454  PROT 7.3  ALBUMIN 4.6  AST 29  ALT 22  ALKPHOS 138*  BILITOT 0.3      Lipids  Last Labs  No results for input(s): "CHOL", "TRIG", "HDL", "LABVLDL", "LDLCALC", "CHOLHDL" in the last 168 hours.    Hematology Last Labs     Recent Labs  Lab 08/24/23 1454  WBC 11.7*  RBC 5.11  HGB 14.9  HCT 44.8  MCV 87.7  MCH 29.2  MCHC 33.3  RDW 13.5  PLT 342      Thyroid   Last Labs  No results for input(s): "TSH", "FREET4" in the last 168 hours.    BNP Last Labs     Recent Labs  Lab 08/24/23 1910  PROBNP 120.0      DDimer  Last Labs  No results for input(s):  "DDIMER" in the last 168 hours.       Radiology/Studies:  No results found.     Assessment and Plan:    Dizziness and, hypotension concern for low output cardiac state, cardiogenic shock Chronic dilated ischemic cardiomyopathy with EF 30 to 35%, echo 06/18/2023.  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  Lower extremity no edema, lukewarm on exam.  Blood pressure is improving. ---> We will wean her off of norepinephrine, continue to monitor blood pressure and symptoms.  I suspect she was dry and is volume resuscitated and responded however there is still concerns of low output state.  She had low cardiac index back in March right heart cath. --> Advanced heart failure consultation and taking over this case  --> if she continues to have symptoms I will low blood pressure then obtain a PICC line and mixed venous and consider milrinone.Aaron Aas GDMT is on hold she is not on ACE ARB or Arni due to low blood pressure. Need to readjust her Lasix  probably at lower dose. Patient reports she gained weight but no clinical signs of heart failure  Full code     Risk Assessment/Risk Scores:        [place]  Heart Association (NYHA) Functional Class NYHA Class III       For questions or updates, please contact [facility] Please consult [email or web address] for contact info under      Signed, Cranston Dk, MD  08/24/2023 8:52 PM

## 2023-08-26 ENCOUNTER — Telehealth: Payer: Self-pay | Admitting: Internal Medicine

## 2023-08-26 DIAGNOSIS — Z419 Encounter for procedure for purposes other than remedying health state, unspecified: Secondary | ICD-10-CM | POA: Diagnosis not present

## 2023-08-26 DIAGNOSIS — R570 Cardiogenic shock: Secondary | ICD-10-CM | POA: Diagnosis not present

## 2023-08-26 LAB — BASIC METABOLIC PANEL WITH GFR
Anion gap: 7 (ref 5–15)
BUN: 15 mg/dL (ref 6–20)
CO2: 23 mmol/L (ref 22–32)
Calcium: 8.3 mg/dL — ABNORMAL LOW (ref 8.9–10.3)
Chloride: 106 mmol/L (ref 98–111)
Creatinine, Ser: 1.08 mg/dL — ABNORMAL HIGH (ref 0.44–1.00)
GFR, Estimated: >60 mL/min (ref >60)
Glucose, Bld: 124 mg/dL — ABNORMAL HIGH (ref 70–99)
Potassium: 3.1 mmol/L — ABNORMAL LOW (ref 3.5–5.1)
Sodium: 136 mmol/L (ref 135–145)

## 2023-08-26 LAB — MAGNESIUM: Magnesium: 2.1 mg/dL (ref 1.7–2.4)

## 2023-08-26 LAB — VEDOLIZUMAB AND ANTI-VEDO AB
Anti-Vedolizumab Antibody: <25 ng/mL
Vedolizumab: 11 ug/mL

## 2023-08-26 LAB — GLUCOSE, CAPILLARY
Glucose-Capillary: 89 mg/dL (ref 70–99)
Glucose-Capillary: 107 mg/dL — ABNORMAL HIGH (ref 70–99)

## 2023-08-26 MED ORDER — SPIRONOLACTONE 12.5 MG HALF TABLET
12.5000 mg | ORAL_TABLET | Freq: Every day | ORAL | Status: DC
  Filled 2023-08-26: qty 1

## 2023-08-26 MED ORDER — SPIRONOLACTONE 25 MG PO TABS: 12.5000 mg | ORAL_TABLET | Freq: Every day | ORAL | Status: DC

## 2023-08-26 MED ORDER — POTASSIUM CHLORIDE CRYS ER 20 MEQ PO TBCR: 80.0000 meq | EXTENDED_RELEASE_TABLET | Freq: Two times a day (BID) | ORAL | Status: DC

## 2023-08-26 MED ORDER — FUROSEMIDE 20 MG PO TABS
40.0000 mg | ORAL_TABLET | Freq: Two times a day (BID) | ORAL | Status: DC
Start: 2023-08-27 — End: 2023-09-02

## 2023-08-26 MED ORDER — POTASSIUM CHLORIDE CRYS ER 20 MEQ PO TBCR
40.0000 meq | EXTENDED_RELEASE_TABLET | ORAL | Status: AC
  Administered 2023-08-26 (×2): 40 meq via ORAL
  Filled 2023-08-26 (×2): qty 2

## 2023-08-26 MED ORDER — TRAMADOL HCL 50 MG PO TABS: 50.0000 mg | ORAL_TABLET | Freq: Once | ORAL | Status: DC

## 2023-08-26 NOTE — Progress Notes (Signed)
 Heart Failure Navigator Progress Note  Assessed for Heart & Vascular TOC clinic readiness.  Patient does not meet criteria due to established with advanced heart failure clinic, patient of Dr. Bensimhon. AHF team is consulted this admission.   Navigator will sign off.   Jerilyn Monte, PharmD, BCPS Heart Failure Stewardship Pharmacist Phone [phone]

## 2023-08-26 NOTE — Telephone Encounter (Signed)
 Called & spoke to the patient. Verified name & DOB. Kindly informed patient that there are no earlier openings available. Noted that the patient has an appointment with Dr. Lincoln Renshaw 10/24/2023 at 2:50 am. Advised patient to address concerns during that visit.   Copied from CRM [id number]. Topic: Appointments - Scheduling Inquiry for Clinic >> Aug 26, 2023 12:04 PM Hamp Levine R wrote:  Reason for CRM: Patient is currently in the hospital and being discharged today 08/26/23. Would like to schedule a hospital follow up but wants it to be with Dr Lincoln Renshaw, her PCP. Advised nothing until July on her schedule. Would like a call from the office to see if there is anyway to fit her into Dr Anselmo Bast schedule.  Patient can be reached at [phone]

## 2023-08-26 NOTE — Progress Notes (Addendum)
 Patient ID: Christine [name], female   DOB: Jul 22, 1965, 58 y.o.   MRN: [id number]     Advanced Heart Failure Rounding Note  Cardiologist: Christine Sos, MD  AHF: Dr. Julane [name]  Chief Complaint: Hypotension  Patient Profile Christine [name] is a 58 y.o. female with a hx of dilated cardiomyopathy, single-vessel CAD with mid LAD complete occlusion, nonviable myocardium and infarct 830 including inferior infarction, status post ICD admitted for symptomatic hypotension requiring NE support, felt 2/2 hypovolemia.   Subjective:    Off NE. Received 2L IVF while in the ED.   BP in the mid 90s systolic, which she reports is her usual baseline. Feels better. No longer dizzy or orthostatic. Denies dyspnea.    Objective:   Weight Range: 86.2 kg Body mass index is 27.27 kg/m.   Vital Signs:   Temp:  [97.8 F (36.6 C)-98.5 F (36.9 C)] 97.9 F (36.6 C) (05/12 0411) Pulse Rate:  [61-80] 72 (05/12 0739) Resp:  [13-37] 18 (05/12 0739) BP: (79-105)/(54-73) 96/57 (05/12 0739) SpO2:  [93 %-100 %] 100 % (05/12 0739) Weight:  [86.2 kg] 86.2 kg (05/12 0500) Last BM Date : 08/25/23  Weight change: Filed Weights   08/24/23 1346 08/25/23 0145 08/26/23 0500  Weight: 83.9 kg 85.9 kg 86.2 kg    Intake/Output:   Intake/Output Summary (Last 24 hours) at 08/26/2023 0852 Last data filed at 08/26/2023 0839 Gross per 24 hour  Intake 480 ml  Output --  Net 480 ml      Physical Exam    General:  Well appearing. No resp difficulty HEENT: Normal Neck: Supple. JVP not elevated. Carotids 2+ bilat; no bruits. No lymphadenopathy or thyromegaly appreciated. Cor: PMI nondisplaced. Regular rate & rhythm. No rubs, gallops or murmurs. Lungs: Clear Abdomen: Soft, nontender, nondistended. No hepatosplenomegaly. No bruits or masses. Good bowel sounds. Extremities: No cyanosis, clubbing, rash, edema Neuro: Alert & orientedx3, cranial nerves grossly intact. moves all 4 extremities w/o difficulty. Affect  pleasant   Telemetry   NSR 80s (personally reviewed)  Labs    CBC Recent Labs    08/24/23 1454 08/25/23 1256  WBC 11.7* 6.5  NEUTROABS 8.7*  --   HGB 14.9 13.6  HCT 44.8 40.9  MCV 87.7 89.3  PLT 342 309   Basic Metabolic Panel Recent Labs    40/98/11 1454 08/25/23 1256 08/26/23 0507  NA 137 135 136  K 4.9 4.9 3.1*  CL 100 105 106  CO2 27 25 23   GLUCOSE 58* 83 124*  BUN 25* 17 15  CREATININE 1.31* 1.32* 1.08*  CALCIUM  9.5 9.1 8.3*  MG 2.5*  --   --    Liver Function Tests Recent Labs    08/24/23 1454 08/25/23 1256  AST 29 19  ALT 22 20  ALKPHOS 138* 90  BILITOT 0.3 0.6  PROT 7.3 6.4*  ALBUMIN 4.6 3.4*   No results for input(s): "LIPASE", "AMYLASE" in the last 72 hours. Cardiac Enzymes No results for input(s): "CKTOTAL", "CKMB", "CKMBINDEX", "TROPONINI" in the last 72 hours.  BNP: BNP (last 3 results) Recent Labs    05/22/23 1453 06/27/23 1419 07/09/23 1506  BNP 75.8 34.5 42.4    ProBNP (last 3 results) Recent Labs    08/24/23 1910  PROBNP 120.0     D-Dimer No results for input(s): "DDIMER" in the last 72 hours. Hemoglobin A1C No results for input(s): "HGBA1C" in the last 72 hours. Fasting Lipid Panel No results for input(s): "CHOL", "HDL", "LDLCALC", "TRIG", "CHOLHDL", "LDLDIRECT" in the last  72 hours. Thyroid  Function Tests No results for input(s): "TSH", "T4TOTAL", "T3FREE", "THYROIDAB" in the last 72 hours.  Invalid input(s): "FREET3"  Other results:   Imaging    No results found.    Medications:     Scheduled Medications:  ALPRAZolam   2 mg Oral TID   apixaban   5 mg Oral BID   atorvastatin   80 mg Oral Daily   buPROPion   150 mg Oral Daily   Chlorhexidine  Gluconate Cloth  6 each Topical Daily   famotidine   20 mg Oral BID   ivabradine   2.5 mg Oral BID WC   lipase/protease/amylase  36,000 Units Oral TID AC   loratadine   10 mg Oral Daily   mupirocin ointment  1 Application Nasal BID   potassium chloride   40 mEq  Oral Q4H   sodium chloride  flush  3 mL Intravenous Q12H    Infusions:    PRN Medications: acetaminophen , ondansetron  (ZOFRAN ) IV, mouth rinse, sodium chloride  flush, zolpidem    Assessment/Plan   1. Hypotension: Suspect due to hypovolemia.  Patient came to ER with SBP in 80s and lightheadedness.  She was started on norepinephrine and admitted to ICU.  She was thought to be dehydrated and was given 2 L NS.  She has now weaned off NE. Lactate is normal.  Creatinine stable.  I think that hypotension was due to hypovolemia, she continues to take Lasix  80 mg po bid and had had some diarrhea recently (this is periodic with her Crohns Disease).  After RHC in 3/25 suggesting hypovolemia, she did not cut back on her Lasix .  - Continue Corlanor  2.5 mg bid  - Restart Spiro 12.5 mg today  - Hold off on restarting Toprol  XL until clinic f/u as BPs are still soft.  - She is going to need to cut back on Lasix  at home, decrease to 40 mg bid at discharge.  2. Chronic systolic CHF: Echo (3/25) with EF 30-35%, RV low normal function.  Ischemic cardiomyopathy.  GDMT has been limited by low BP.  She has been taking a high dose of Lasix  (80 mg bid) despite known hypovolemia on last RHC.  She is not volume overloaded on exam, lactate is normal.  - As above, will use lower dose of Lasix  40 mg bid when she goes home.  - Continue Corlanor .  - Restart spirnolactone 12.5 mg today. BP may be too soft for restarting Toprol . Can add back as outpatient  3. H/o LV thrombus: Continue Eliquis .  4. CAD: H/o occluded LAD.  No chest pain, normal troponin.  - Continue statin.  - She is on apixaban  so no ASA.  5. Hypokalemia: K 3.1, Mg pending  - give K supp and restart spiro 12.5 mg   Plan d/c home today. Will arrange hospital f/u in 1 wk   Length of Stay: 1  Christine [name]  08/26/2023, 8:52 AM  Advanced Heart Failure Team Pager [phone] (M-F; 7a - 5p)  Please contact [facility] for night-coverage after  hours (5p -7a ) and weekends on [email or web address]  Patient seen with PA, I formulated the plan and agree with the above note.   SBP in 90s-100s today, this is her baseline. No lightheadedness with standing though she has had a headache. Creatinine lower.    General: NAD Neck: No JVD, no thyromegaly or thyroid  nodule.  Lungs: Clear to auscultation bilaterally with normal respiratory effort. CV: Nondisplaced PMI.  Heart regular S1/S2, no S3/S4, no murmur.  No peripheral edema.  Abdomen: Soft, nontender, no hepatosplenomegaly, no distention.  Skin: Intact without lesions or rashes.  Neurologic: Alert and oriented x 3.  Psych: Normal affect. Extremities: No clubbing or cyanosis.  HEENT: Normal.   I think the primary problem here was over-diuresis/hypovolemia.  She can go home today as BP now stable with no orthostatic symptoms and lower creatinine.   - She will cut Lasix  back to 40 mg po bid and start back on it tomorrow.  - She will cut her KCl supplement dosing in half.  - She can restart spironolactone  12.5 at bedtime today but will not restart Toprol  XL until reassessed in office.   - She also needs to stop metolazone .   Peder Bourdon 08/26/2023 10:59 AM

## 2023-08-26 NOTE — Discharge Summary (Addendum)
 Advanced Heart Failure Team  Discharge Summary   Patient ID: Christine [name] MRN: [id number], DOB/AGE: Aug 05, 1965 58 y.o. Admit date: 08/24/2023 D/C date:     08/26/2023   Primary Discharge Diagnoses:  Symptomatic Hypotension Requiring IV Vasopressor Support    Hospital Course:   Christine [name] is a 58 y.o. female with a hx of dilated cardiomyopathy, single-vessel CAD with mid LAD complete occlusion, nonviable myocardium and infarct including inferior infarction, status post ICD who presented to the ED for evaluation for dizziness and was noted to be hypotensive w/ SBPs in the low 80s. She was given 2L of IVF and continued w/ persistently low BP. Required initiation of Levophed for BP support and admitted to the ICU.   Of note, she had just had recent RHC on 07/12/23 that was c/w volume depletion w/ low CO. This improved w/ volume resuscitation in the cath lab (Initial hemodynamics RA 5, PA 20/11, PCW 8, FICK CI 1.96, TD CI 1.98, PAPi 1.8, Repeat hemodynamics after 300 cc fluid bolus PA 28/11, PW 10, FICK CI 2.1, TD CI 2.5). It was suspected she was keeping herself too dry w/ diuretics. She was instructed to reduce her lasix  in half but never did, still taking 80 mg bid + twice weekly metolazone .   This admission, she was also felt to be volume deplete and the reason for her hypotension. As noted above, she was hydrated w/ IVFs and diuretics and BP active meds held. BP improved. She was able to wean off NE and transferred out of ICU. Her BP remained stable into the upper 90s systolic, c/w her baseline and she denied any further dizziness/ orthostatic symptoms.  She was restarted on corlanor  and low dose spironolactone . She was instructed to resume lasix  on 5/13 but at reduced dose of 40 mg bid. KCl regimen was also reduced. Metolazone  was discontinued. Jardiance and Toprol  XL remained on hold at d/c. Will reassess at clinic f/u and added back if stable.   On 08/26/23, she was last seen and examined  by Dr. Mitzie Anda and felt stable for discharge home. Hospital f/u has been arranged in 1 wk     Discharge Weight Range: 190 lb  Discharge Vitals: Blood pressure (!) 96/57, pulse 72, temperature 97.9 F (36.6 C), resp. rate 18, height 5\' 10"  (1.778 m), weight 86.2 kg, SpO2 100%.  Labs: Lab Results  Component Value Date   WBC 6.5 08/25/2023   HGB 13.6 08/25/2023   HCT 40.9 08/25/2023   MCV 89.3 08/25/2023   PLT 309 08/25/2023    Recent Labs  Lab 08/25/23 1256 08/26/23 0507  NA 135 136  K 4.9 3.1*  CL 105 106  CO2 25 23  BUN 17 15  CREATININE 1.32* 1.08*  CALCIUM  9.1 8.3*  PROT 6.4*  --   BILITOT 0.6  --   ALKPHOS 90  --   ALT 20  --   AST 19  --   GLUCOSE 83 124*   Lab Results  Component Value Date   CHOL 185 03/01/2022   HDL 36 (L) 03/01/2022   LDLCALC 105 (H) 03/01/2022   TRIG 221 (H) 03/01/2022   BNP (last 3 results) Recent Labs    05/22/23 1453 06/27/23 1419 07/09/23 1506  BNP 75.8 34.5 42.4    ProBNP (last 3 results) Recent Labs    08/24/23 1910  PROBNP 120.0     Diagnostic Studies/Procedures   DG Chest Port 1 View Result Date: 08/24/2023 CLINICAL DATA:  Dizziness and hypotension x2  days. EXAM: PORTABLE CHEST 1 VIEW COMPARISON:  May 28, 2023 FINDINGS: There is stable single lead ventricular pacer positioning. The heart size and mediastinal contours are within normal limits. Low lung volumes are noted. Both lungs are clear. The visualized skeletal structures are unremarkable. IMPRESSION: Low lung volumes without acute cardiopulmonary disease. Electronically Signed   By: Virgle Grime M.D.   On: 08/24/2023 21:35    Discharge Medications   Allergies as of 08/26/2023       Reactions   Gabapentin Diarrhea   Sulfa Antibiotics Hives, Itching, Rash   Erythromycin Hives   Tramadol Rash, Other (See Comments)   Urinary retention   Ibuprofen Itching   Tape Rash        Medication List     STOP taking these medications    Farxiga  10 MG  Tabs tablet Generic drug: dapagliflozin  propanediol   metolazone  2.5 MG tablet Commonly known as: ZAROXOLYN    metoprolol  succinate 25 MG 24 hr tablet Commonly known as: TOPROL -XL       TAKE these medications    Accu-Chek Guide w/Device Kit Check blood sugar once daily before breakfast   Accu-Chek Softclix Lancets lancets Use as instructed.  Check blood sugar daily before breakfast.   acetaminophen  500 MG tablet Commonly known as: TYLENOL  Take 1,000 mg by mouth 3 (three) times daily as needed for moderate pain or headache.   ALPRAZolam  1 MG tablet Commonly known as: XANAX  Take 2 mg by mouth 3 (three) times daily.   Antacid Regular Strength 200-200-20 MG/5ML suspension Generic drug: alum & mag hydroxide-simeth Take 30 mLs by mouth every 4 (four) hours as needed for indigestion or heartburn.   atorvastatin  80 MG tablet Commonly known as: LIPITOR  Take 1 tablet (80 mg total) by mouth daily.   buPROPion  150 MG 24 hr tablet Commonly known as: WELLBUTRIN  XL Take 150 mg by mouth daily.   cetirizine  10 MG tablet Commonly known as: ZyrTEC  Allergy  Take 1 tablet (10 mg total) by mouth 2 (two) times daily.   cyclobenzaprine  10 MG tablet Commonly known as: FLEXERIL  Take 10 mg by mouth 3 (three) times daily as needed for muscle spasms.   diphenoxylate -atropine  2.5-0.025 MG tablet Commonly known as: LOMOTIL  Take 2 tablets by mouth 4 (four) times daily as needed for diarrhea/ loose stools. What changed: Another medication with the same name was removed. Continue taking this medication, and follow the directions you see here.   Eliquis  5 MG Tabs tablet Generic drug: apixaban  Take 1 tablet (5 mg total) by mouth 2 (two) times daily.   Entyvio  300 MG injection Generic drug: vedolizumab  Inject 300 mg into the vein every 2 (two) months.   Eszopiclone  3 MG Tabs Take 1 tablet (3 mg total) by mouth at bedtime. Take immediately before bedtime   famotidine  20 MG tablet Commonly  known as: PEPCID  Take 1 tablet (20 mg total) by mouth 2 (two) times daily.   feeding supplement Liqd Take 237 mLs by mouth 2 (two) times daily between meals.   fluticasone  50 MCG/ACT nasal spray Commonly known as: FLONASE  Place 1-2 sprays into both nostrils daily as needed (nasal congestion).   furosemide  20 MG tablet Commonly known as: LASIX  Take 2 tablets (40 mg total) by mouth 2 (two) times daily. Start taking on: Aug 27, 2023 What changed: how much to take   glucose blood test strip Use as instructed to check blood sugar daily before breakfast   ivabradine  5 MG Tabs tablet Commonly known as: CORLANOR  Take  1/2 tablet (2.5 mg total) by mouth 2 (two) times daily.   Januvia  50 MG tablet Generic drug: sitaGLIPtin  Take 1 tablet (50 mg total) by mouth daily.   lipase/protease/amylase 16109 UNITS Cpep capsule Commonly known as: CREON  Take 1 capsule (36,000 Units total) by mouth in the morning, at noon, and at bedtime.   metroNIDAZOLE  1 % gel Commonly known as: METROGEL  Apply 1 Application topically daily as needed (rash).   multivitamin with minerals tablet Take 1 tablet by mouth daily.   oxymetazoline 0.05 % nasal spray Commonly known as: AFRIN Place 1-2 sprays into both nostrils 2 (two) times daily as needed for congestion.   potassium chloride  SA 20 MEQ tablet Commonly known as: KLOR-CON  M Take 4 tablets (80 mEq total) by mouth 2 (two) times daily. What changed:  how much to take when to take this   promethazine  25 MG tablet Commonly known as: PHENERGAN  Take 1 tablet (25 mg total) by mouth every 6 (six) to 8 (eight) hours for nausea or vomiting.   spironolactone  25 MG tablet Commonly known as: ALDACTONE  Take 0.5 tablets (12.5 mg total) by mouth at bedtime. What changed:  how much to take when to take this   triamcinolone  cream 0.1 % Commonly known as: KENALOG  Apply 1 Application topically daily as needed (irritation).   valACYclovir  500 MG  tablet Commonly known as: Valtrex  Take 1 tablet (500 mg total) by mouth 2 (two) times daily. What changed:  when to take this reasons to take this   Veozah  45 MG Tabs Generic drug: Fezolinetant  Take 1 tablet (45 mg total) by mouth daily.   Vitamin D  (Ergocalciferol ) 1.25 MG (50000 UNIT) Caps capsule Commonly known as: DRISDOL  Take 1 capsule (50,000 Units total) by mouth every 7 (seven) days.        Disposition   The patient will be discharged in stable condition to home.   Follow-up Information     [facility] Follow up.   Specialty: Cardiology Why: 09/06/23 at 1:30 PM   Hospital Follow Up in the Advanced Heart Failure Clinic at [facility] Contact information: [address] [place]  [phone]        Lawrance Presume, MD Follow up.   Specialty: Internal Medicine Why: please contact your PCP to schedule hospital follow up in 7-14 days Contact information: [address] [place] [address] [phone]                   Duration of Discharge Encounter: Greater than 35 minutes   Signed, Ernestene Headings  08/26/2023, 2:23 PM  Patient seen with PA, I formulated the plan and agree with the above note.   Please see my separate note for today with my evaluation.   Peder Bourdon 08/26/2023 4:49 PM

## 2023-08-26 NOTE — Progress Notes (Signed)
 DISCHARGE NOTE HOME Christine [name] to be discharged Home per MD order. Discussed prescriptions and follow up appointments with the patient. Prescriptions given to patient; medication list explained in detail. Patient verbalized understanding.  Skin clean, dry and intact without evidence of skin break down, no evidence of skin tears noted. IV catheter discontinued intact. Site without signs and symptoms of complications. Dressing and pressure applied. Pt denies pain at the site currently. No complaints noted.  Patient free of lines, drains, and wounds.   An After Visit Summary (AVS) was printed and given to the patient. Patient escorted via wheelchair, and discharged home via private auto.  Elvina Hammers, RN

## 2023-08-26 NOTE — TOC Transition Note (Addendum)
 Transition of Care [facility]) - Discharge Note   Patient Details  Name: Christine [name] MRN: [id number] Date of Birth: Jan 09, 1966  Transition of Care [facility]) CM/SW Contact:  Benjiman Bras, RN Phone Number: [phone] 08/26/2023, 12:03 PM   Clinical Narrative:    TOC CM spoke to pt and states she lives at home with sister. Gave permission to speak to sister. States she is independent pta. Patient sister will provide transportation to home. Pt states she has scale at home to do daily weights. States she tries to adhere to heart healthy diet.   Patient states she prefers to see her PCP but they do not have any appts earlier that July, contacted [facility] and they will send a message over to PCP to see if they can schedule appt for hospital follow up.   Patient has compliant of headache, states she notified attending, attending and Unit RN aware.   Final next level of care: Home/Self Care Barriers to Discharge: No Barriers Identified   Patient Goals and CMS Choice            Discharge Placement                       Discharge Plan and Services Additional resources added to the After Visit Summary for     Discharge Planning Services: CM Consult                                 Social Drivers of Health (SDOH) Interventions SDOH Screenings   Food Insecurity: No Food Insecurity (08/25/2023)  Housing: Low Risk  (08/25/2023)  Transportation Needs: No Transportation Needs (08/25/2023)  Utilities: Not At Risk (08/25/2023)  Alcohol Screen: Low Risk  (05/16/2023)  Depression (PHQ2-9): Medium Risk (01/15/2023)  Financial Resource Strain: Medium Risk (05/16/2023)  Physical Activity: Insufficiently Active (05/16/2023)  Social Connections: Moderately Isolated (05/16/2023)  Stress: Stress Concern Present (05/16/2023)  Tobacco Use: Medium Risk (08/24/2023)     Readmission Risk Interventions     No data to display

## 2023-08-27 ENCOUNTER — Telehealth: Payer: Self-pay | Admitting: Internal Medicine

## 2023-08-27 ENCOUNTER — Telehealth: Payer: Self-pay

## 2023-08-27 ENCOUNTER — Encounter (HOSPITAL_COMMUNITY)

## 2023-08-27 LAB — GLUCOSE, CAPILLARY: Glucose-Capillary: 134 mg/dL — ABNORMAL HIGH (ref 70–99)

## 2023-08-27 NOTE — Transitions of Care (Post Inpatient/ED Visit) (Signed)
   08/27/2023  Name: Christine [name] MRN: [id number] DOB: [birth year]  Today's TOC FU Call Status: Today's TOC FU Call Status:: Successful TOC FU Call Completed TOC FU Call Complete Date: 08/27/23 Patient's Name and Date of Birth confirmed.  Transition Care Management Follow-up Telephone Call Date of Discharge: 08/26/23 Discharge Facility: [facility]) Type of Discharge: Inpatient Admission Primary Inpatient Discharge Diagnosis:: cardiogenic shock How have you been since you were released from the hospital?: Better (She said she feels okay, better than she did in the hospital; but not great.) Any questions or concerns?: Yes Patient Questions/Concerns:: She said she wants to let Dr Lincoln Renshaw know that she saw Alaina Howell, MD on 07/29/2023 because she was waking up with her hands, face and feet freezing . She called [facility] 07/26/2023 and was told by E[facility] that there were no appointments here to they scheduled her at [facility]. She said she asked Dr Augustus Ledger if she has a vitamin deficiency and the patient said she was diagnosed with a vitamin D  deficiency. Now she reports that she is getting chills throughout the day and her left eyebrows are falling out, not the right eyebrows. Her appointment with Dr Lincoln Renshaw is 10/24/2023 and she would like to know if Dr Lincoln Renshaw would like to see her sooner, Patient Questions/Concerns Addressed: Notified Provider of Patient Questions/Concerns  Items Reviewed: Did you receive and understand the discharge instructions provided?: Yes Medications obtained,verified, and reconciled?: No Medications Not Reviewed Reasons::  (She said she has all of her meds and did not have any questions about the med regime and did not need to review the med list.She said it was reviewed wiht her before she waws discharged.She confirmed she has a glucometer.  Blood sugar this morning: 105) Any new allergies since your discharge?: No Dietary orders reviewed?: Yes Type of  Diet Ordered:: heart healthy, low sodium, diabetic Do you have support at home?: Yes People in Home [RPT]: sibling(s) Name of Support/Comfort Primary Source: her sister  Medications Reviewed Today: Medications Reviewed Today   Medications were not reviewed in this encounter     Home Care and Equipment/Supplies: Were Home Health Services Ordered?: No Any new equipment or medical supplies ordered?: No  Functional Questionnaire: Do you need assistance with bathing/showering or dressing?: No Do you need assistance with meal preparation?: No Do you need assistance with eating?: No Do you have difficulty maintaining continence: No Do you need assistance with getting out of bed/getting out of a chair/moving?: No Do you have difficulty managing or taking your medications?: No  Follow up appointments reviewed: PCP Follow-up appointment confirmed?: Yes Date of PCP follow-up appointment?: 09/24/23 Follow-up Provider: Dr Lincoln Renshaw - I offered to schedule her with another provider to be seen sooner but she said she really loves Dr Lincoln Renshaw and wants to see her,  I also explained that we have a MMU in the community where she can be seen without an appointment if she needs to see a provider before her appointment with Dr [facility] Follow-up appointment confirmed?: Yes Date of Specialist follow-up appointment?: 09/06/23 Follow-Up Specialty Provider:: cardiology/[facility]   09/30/2023- pulmonary Do you need transportation to your follow-up appointment?: No Do you understand care options if your condition(s) worsen?: Yes-patient verbalized understanding    SIGNATURE. Burnett Carson, RN

## 2023-08-27 NOTE — Telephone Encounter (Signed)
 Copied from CRM [id number]. Topic: Appointments - Scheduling Inquiry for Clinic >> Aug 27, 2023 10:54 AM Baldemar Lev wrote:  Reason for CRM: Pt has received a letter with a bill stating that an appt she had should have been pre approved. Says she was scheduled with stephanie matthews at [facility].   Monday April 14th.  260 dollars. Pt cannot afford this.

## 2023-08-27 NOTE — Telephone Encounter (Signed)
 I spoke to the patient and she said she ha already contacted [facility] about this.

## 2023-08-27 NOTE — Telephone Encounter (Signed)
 From [facility] call:  She stated she feels okay, better than she did in the hospital; but not great.  She said she wants to let Dr Lincoln Renshaw know that she saw Alaina Howell, MD on 07/29/2023 because she was waking up with her hands, face and feet freezing . She called [facility] 07/26/2023 and was told by E2C2 that there were no appointments here so they scheduled her at [facility]. She said she asked Dr Augustus Ledger if she has a vitamin deficiency and the patient said she was diagnosed with a vitamin D  deficiency. Now she reports that she is getting chills throughout the day and her left eyebrows are falling out, not the right eyebrows. Her appointment with Dr Lincoln Renshaw is 10/24/2023 and she would like to know if Dr Lincoln Renshaw would like to see her sooner,  I offered to schedule her with another provider to be seen sooner but she said she really loves Dr Lincoln Renshaw and wants to see her, I also explained that we have a MMU in the community where she can be seen without an appointment if she needs to see a provider before her appointment with Dr Lincoln Renshaw

## 2023-08-28 NOTE — Telephone Encounter (Signed)
 Thanks for the FYI. Will send message to front desk so that they can put her on a wait list so that she can be plugged in if there are any cancellations for me in the next 1-2 wks.

## 2023-08-28 NOTE — Telephone Encounter (Signed)
 Noted.

## 2023-09-02 ENCOUNTER — Other Ambulatory Visit (HOSPITAL_COMMUNITY): Payer: Self-pay

## 2023-09-02 ENCOUNTER — Other Ambulatory Visit (HOSPITAL_COMMUNITY): Payer: Self-pay | Admitting: Cardiology

## 2023-09-02 MED ORDER — POTASSIUM CHLORIDE CRYS ER 20 MEQ PO TBCR
80.0000 meq | EXTENDED_RELEASE_TABLET | Freq: Two times a day (BID) | ORAL | 3 refills | Status: DC
  Filled 2023-09-02: qty 240, 30d supply, fill #0
  Filled 2023-10-14: qty 240, 30d supply, fill #1
  Filled 2023-11-18: qty 240, 30d supply, fill #2
  Filled 2023-12-16: qty 240, 30d supply, fill #3

## 2023-09-02 MED ORDER — SPIRONOLACTONE 25 MG PO TABS
12.5000 mg | ORAL_TABLET | Freq: Every day | ORAL | 3 refills | Status: DC
  Filled 2023-09-02 – 2023-10-11 (×2): qty 15, 30d supply, fill #0

## 2023-09-02 MED ORDER — FUROSEMIDE 20 MG PO TABS
40.0000 mg | ORAL_TABLET | Freq: Two times a day (BID) | ORAL | 3 refills | Status: DC
  Filled 2023-09-02 – 2023-09-16 (×2): qty 120, 30d supply, fill #0
  Filled 2023-10-06 – 2023-10-07 (×2): qty 120, 30d supply, fill #1
  Filled 2023-10-30: qty 120, 30d supply, fill #2
  Filled 2023-11-19: qty 120, 30d supply, fill #3

## 2023-09-03 ENCOUNTER — Other Ambulatory Visit (HOSPITAL_COMMUNITY): Payer: Self-pay

## 2023-09-03 ENCOUNTER — Ambulatory Visit: Attending: Internal Medicine | Admitting: Internal Medicine

## 2023-09-03 ENCOUNTER — Encounter: Payer: Self-pay | Admitting: Internal Medicine

## 2023-09-03 VITALS — BP 99/66 | HR 76 | Temp 98.1°F | Ht 70.0 in | Wt 188.0 lb

## 2023-09-03 DIAGNOSIS — E119 Type 2 diabetes mellitus without complications: Secondary | ICD-10-CM | POA: Diagnosis not present

## 2023-09-03 DIAGNOSIS — Z7984 Long term (current) use of oral hypoglycemic drugs: Secondary | ICD-10-CM | POA: Diagnosis not present

## 2023-09-03 DIAGNOSIS — E861 Hypovolemia: Secondary | ICD-10-CM

## 2023-09-03 DIAGNOSIS — I959 Hypotension, unspecified: Secondary | ICD-10-CM

## 2023-09-03 DIAGNOSIS — K50919 Crohn's disease, unspecified, with unspecified complications: Secondary | ICD-10-CM

## 2023-09-03 DIAGNOSIS — E559 Vitamin D deficiency, unspecified: Secondary | ICD-10-CM

## 2023-09-03 DIAGNOSIS — R6889 Other general symptoms and signs: Secondary | ICD-10-CM

## 2023-09-03 DIAGNOSIS — Z09 Encounter for follow-up examination after completed treatment for conditions other than malignant neoplasm: Secondary | ICD-10-CM | POA: Diagnosis not present

## 2023-09-03 LAB — POCT GLYCOSYLATED HEMOGLOBIN (HGB A1C): HbA1c, POC (controlled diabetic range): 5.5 % (ref 0.0–7.0)

## 2023-09-03 LAB — GLUCOSE, POCT (MANUAL RESULT ENTRY): POC Glucose: 103 mg/dL — AB (ref 70–99)

## 2023-09-03 MED ORDER — ATORVASTATIN CALCIUM 80 MG PO TABS
80.0000 mg | ORAL_TABLET | Freq: Every day | ORAL | 3 refills | Status: AC
  Filled 2023-09-03 – 2023-10-29 (×2): qty 90, 90d supply, fill #0
  Filled 2024-01-27: qty 90, 90d supply, fill #1
  Filled 2024-05-13: qty 90, 90d supply, fill #2

## 2023-09-03 MED ORDER — PREDNISONE 20 MG PO TABS
ORAL_TABLET | ORAL | 0 refills | Status: DC
  Filled 2023-09-03: qty 5, 7d supply, fill #0

## 2023-09-03 NOTE — Patient Instructions (Signed)
 VISIT SUMMARY:  Today, we discussed your persistent chills and concerns about blood pressure management. We reviewed your recent hospitalization and adjustments to your medications. We also addressed your Crohn's disease symptoms, diabetes management, and vitamin D  deficiency.  YOUR PLAN:  -CONGESTIVE HEART FAILURE: Congestive heart failure means your heart is not pumping blood as well as it should. We recommend maintaining your weight between 188-192 pounds, ensuring adequate fluid intake, and monitoring your weight regularly. Please communicate with your cardiologist about your low blood pressure and any potential need for medication adjustments.  -HYPOTENSION: Hypotension means low blood pressure. You should monitor your blood pressure regularly, ensure you are drinking enough fluids, and avoid driving if you feel dizzy. Please communicate with your cardiologist about your persistent low blood pressure and any potential need for medication adjustments.  -CROHN'S DISEASE: Crohn's disease is a chronic inflammatory condition of the gastrointestinal tract. Given your recent symptoms, we are prescribing prednisone  for a potential flare. Please contact your gastroenterologist if your symptoms worsen and seek emergency care if you develop a fever.  -TYPE 2 DIABETES MELLITUS: Type 2 diabetes means your body does not use insulin  properly. Your diabetes is well-controlled with an A1c of 5.5% and blood glucose levels between 99-115 mg/dL. Continue your current management with Januvia .  -VITAMIN D  DEFICIENCY: Vitamin D  deficiency means you have low levels of vitamin D  in your body. Continue your weekly vitamin D  supplementation. We will monitor your symptoms and consider reviewing your medications if the chills persist.  INSTRUCTIONS:  Please follow up with your cardiologist regarding your low blood pressure and any potential need for medication adjustments. Additionally, contact your gastroenterologist if  your Crohn's disease symptoms worsen and seek emergency care if you develop a fever.

## 2023-09-03 NOTE — Progress Notes (Signed)
 Patient ID: Christine [name], female    DOB: 1965-05-26  MRN: [id number]  CC: TOC Date of hospitalization: 5/10-12/25 Date of call from [facility]: 08/27/2023  Hospitalization Follow-up (Hospitalization f/u. Med refill. Christine [name] April 2025 started having cold episodes in the a.m. & was prescribed Vit D - worsening chills through out the day /Concern about low BP readings /)   Subjective: Christine [name] is a 58 y.o. female who presents for transition of care visit. 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   Discussed the use of AI scribe software for clinical note transcription with the patient, who gave verbal consent to proceed.  History of Present Illness Christine [name] "Christine [name]" is a 58 year old female who presents for TOC with concerns of persistent chills and blood pressure management.  She experiences persistent chills and a sensation of being 'internally cold' since early April.states that when she wakes up in the mornings the chills are so severe that her teeth are chattering.  Initially chills would occur only in the mornings but now they are occurring throughout the day and she gets goosebumps on her arms.  She is needing to use extra blankets and longsleeve clothing especially in the mornings and evenings.  Seen for urgent care visit by [facility] in April for same and was diagnosed with vitamin D  deficiency.  Level at that time was 15.  Started on weekly high-dose vitamin D .  TSH was normal.  CBC has been normal with last hemoglobin of 13.6.  She was recently hospitalized for low blood pressure thought to be due to volume depletion.she required IV fluids and pressor support in  the ICU.  Of note she had RHC 06/2023 that revealed low CO requiring volume resuscitation at that time.  Prior to hospital discharge, adjustments were made in her blood pressure medication and diuretics. Furosemide  is now 40 mg twice daily, and metolazone , Farxiga , and metoprolol  were temporarily discontinued. Potassium supplementation is reduced to 180 MEQs daily. Her blood pressure has been as low as 90s/50s, with associated dizziness. She experiences shortness of breath and cramping in her feet and hips, which she attributes to low potassium levels. Her weight fluctuates between 181 and 190 pounds, and she engages in two 15-minute workouts daily in an attempt to increase blood pressure .  She has Crohn's disease and is on Entyvio , with recent abdominal soreness, decreased appetite, and mild diarrhea today, but no fever or blood in stools.   Her diabetes is well-controlled with an A1c of 5.5% and blood sugars ranging from 99 to 115 mg/dL, managed with Januvia .    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   Ulcer of ileum 02/26/2022  Diverticulosis of colon without hemorrhage 02/26/2022   Cardiomyopathy (HCC) ischemic and non-ischemic 01/16/2022   Abdominal aortic aneurysm (AAA) 3.0 cm to 5.0 cm in diameter in female [facility]) 01/09/2022   Polycythemia 01/09/2022   Chronic combined systolic and diastolic heart failure (HCC) 08/24/2021   CAD in  native artery 08/24/2021   S/P ICD (internal cardiac defibrillator) procedure 08/10/2021   Obstructive sleep apnea 05/05/2021   Former smoker 02/06/2021   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   Obesity (BMI 30.0-34.9) 02/06/2021   Anxiety 01/20/2021   Tobacco abuse 01/20/2021   Shortness of breath 01/20/2021   Chest tightness      Current Outpatient Medications on File Prior to Visit  Medication Sig Dispense Refill   Accu-Chek Softclix Lancets lancets Use as instructed.  Check blood sugar daily before breakfast. 100 each 12   acetaminophen  (TYLENOL ) 500 MG tablet Take 1,000 mg by mouth 3 (three) times daily as needed for moderate pain or headache.     ALPRAZolam  (XANAX ) 1 MG tablet Take 2 mg by mouth 3 (three) times daily.     alum & mag hydroxide-simeth (MAALOX/MYLANTA) 200-200-20 MG/5ML suspension Take 30 mLs by mouth every 4 (four) hours as needed for indigestion or heartburn. 355 mL 0   apixaban  (ELIQUIS ) 5 MG TABS tablet Take 1 tablet (5 mg total) by mouth 2 (two) times daily. 60 tablet 11   Blood Glucose Monitoring Suppl (ACCU-CHEK GUIDE) w/Device KIT Check blood sugar once daily before breakfast 1 kit 0   buPROPion  (WELLBUTRIN  XL) 150 MG 24 hr tablet Take 150 mg by mouth daily.     cetirizine  (ZYRTEC  ALLERGY ) 10 MG tablet Take 1 tablet (10 mg total) by mouth 2 (two) times daily. 60 tablet 5   cyclobenzaprine  (FLEXERIL ) 10 MG tablet Take 10 mg by mouth 3 (three) times daily as needed for muscle spasms.     diphenoxylate -atropine  (LOMOTIL ) 2.5-0.025 MG tablet Take 2 tablets by mouth 4 (four) times daily as needed for diarrhea/ loose stools. 60 tablet 3   Eszopiclone  3 MG TABS Take 1 tablet (3 mg total) by mouth at bedtime. Take immediately before bedtime 30 tablet 0   famotidine  (PEPCID ) 20 MG tablet Take 1 tablet (20 mg total) by mouth 2 (two) times daily. 60 tablet 5   feeding supplement (ENSURE ENLIVE / ENSURE PLUS) LIQD Take 237 mLs by mouth 2 (two) times daily between meals. 237  mL 12   Fezolinetant  (VEOZAH ) 45 MG TABS Take 1 tablet (45 mg total) by mouth daily. 30 tablet 2   fluticasone  (FLONASE ) 50 MCG/ACT nasal spray Place 1-2 sprays into both nostrils daily as needed (nasal congestion). 16 g 5   furosemide  (LASIX ) 20 MG tablet Take 2 tablets (40 mg total) by mouth 2 (two) times daily. 120 tablet 3   glucose blood test strip Use as instructed to check blood sugar daily before breakfast 100 each 6   ivabradine  (CORLANOR ) 5 MG TABS tablet Take 1/2 tablet (2.5 mg total) by mouth 2 (two) times daily. 30 tablet 5   lipase/protease/amylase (CREON ) 36000 UNITS CPEP capsule Take 1 capsule (36,000 Units total) by mouth in the morning, at noon, and at bedtime. 270 capsule 1   metroNIDAZOLE  (METROGEL ) 1 % gel Apply 1 Application topically daily as needed (rash).     Multiple Vitamins-Minerals (MULTIVITAMIN WITH MINERALS) tablet Take 1 tablet by mouth daily.     oxymetazoline (AFRIN) 0.05 % nasal spray Place 1-2 sprays into both nostrils 2 (two) times daily as needed  for congestion.     potassium chloride  SA (KLOR-CON  M) 20 MEQ tablet Take 4 tablets (80 mEq total) by mouth 2 (two) times daily. 240 tablet 3   promethazine  (PHENERGAN ) 25 MG tablet Take 1 tablet (25 mg total) by mouth every 6 (six) to 8 (eight) hours for nausea or vomiting. 90 tablet 3   sitaGLIPtin  (JANUVIA ) 50 MG tablet Take 1 tablet (50 mg total) by mouth daily. 90 tablet 1   spironolactone  (ALDACTONE ) 25 MG tablet Take 0.5 tablets (12.5 mg total) by mouth at bedtime. 15 tablet 3   triamcinolone  cream (KENALOG ) 0.1 % Apply 1 Application topically daily as needed (irritation).     valACYclovir  (VALTREX ) 500 MG tablet Take 1 tablet (500 mg total) by mouth 2 (two) times daily. (Patient taking differently: Take 500 mg by mouth 2 (two) times daily as needed (outbreaks).) 6 tablet 2   vedolizumab  (ENTYVIO ) 300 MG injection Inject 300 mg into the vein every 2 (two) months.     Vitamin D , Ergocalciferol , (DRISDOL ) 1.25 MG  (50000 UNIT) CAPS capsule Take 1 capsule (50,000 Units total) by mouth every 7 (seven) days. 8 capsule 0   No current facility-administered medications on file prior to visit.    Allergies  Allergen Reactions   Gabapentin Diarrhea   Sulfa Antibiotics Hives, Itching and Rash   Erythromycin Hives   Tramadol  Rash and Other (See Comments)    Urinary retention     Ibuprofen Itching   Tape Rash    Social History   Socioeconomic History   Marital status: Single    Spouse name: Not on file   Number of children: 0   Years of education: 16   Highest education level: Bachelor's degree (e.g., BA, AB, BS)  Occupational History   Not on file  Tobacco Use   Smoking status: Former    Current packs/day: 0.00    Types: Cigarettes    Quit date: 01/15/2021    Years since quitting: 2.6    Passive exposure: Past   Smokeless tobacco: Never  Vaping Use   Vaping status: Never Used  Substance and Sexual Activity   Alcohol use: Not Currently   Drug use: Never   Sexual activity: Not on file  Other Topics Concern   Not on file  Social History Narrative   Not on file   Social Drivers of Health   Financial Resource Strain: Medium Risk (09/03/2023)   Overall Financial Resource Strain (CARDIA)    Difficulty of Paying Living Expenses: Somewhat hard  Food Insecurity: No Food Insecurity (09/03/2023)   Hunger Vital Sign    Worried About Running Out of Food in the Last Year: Never true    Ran Out of Food in the Last Year: Never true  Transportation Needs: No Transportation Needs (09/03/2023)   PRAPARE - Administrator, Civil Service (Medical): No    Lack of Transportation (Non-Medical): No  Physical Activity: Insufficiently Active (09/03/2023)   Exercise Vital Sign    Days of Exercise per Week: 5 days    Minutes of Exercise per Session: 20 min  Stress: Stress Concern Present (09/03/2023)   [employer] of Occupational Health - Occupational Stress Questionnaire    Feeling of  Stress : Rather much  Social Connections: Socially Isolated (09/03/2023)   Social Connection and Isolation Panel [NHANES]    Frequency of Communication with Friends and Family: More than three times a week    Frequency of Social Gatherings with Friends and Family: Three  times a week    Attends Religious Services: Never    Active Member of Clubs or Organizations: No    Attends Banker Meetings: Never    Marital Status: Never married  Intimate Partner Violence: Not At Risk (09/03/2023)   Humiliation, Afraid, Rape, and Kick questionnaire    Fear of Current or Ex-Partner: No    Emotionally Abused: No    Physically Abused: No    Sexually Abused: No    Family History  Problem Relation Age of Onset   Eczema Mother    Stroke Mother    Atrial fibrillation Mother    Heart failure Father    Asthma Brother    Stomach cancer Maternal Grandfather    Liver cancer Neg Hx    Esophageal cancer Neg Hx    Colon polyps Neg Hx    Colon cancer Neg Hx    Allergic rhinitis Neg Hx    Angioedema Neg Hx    Urticaria Neg Hx     Past Surgical History:  Procedure Laterality Date   ABDOMINAL HYSTERECTOMY     BIOPSY  02/26/2022   Procedure: BIOPSY;  Surgeon: Annis Kinder, DO;  Location: [facility];  Service: Gastroenterology;;   CARDIAC CATHETERIZATION     CHOLECYSTECTOMY     COLONOSCOPY WITH PROPOFOL  N/A 02/26/2022   Procedure: COLONOSCOPY WITH PROPOFOL ;  Surgeon: Annis Kinder, DO;  Location: [facility];  Service: Gastroenterology;  Laterality: N/A;   ESOPHAGOGASTRODUODENOSCOPY (EGD) WITH PROPOFOL  N/A 02/26/2022   Procedure: ESOPHAGOGASTRODUODENOSCOPY (EGD) WITH PROPOFOL ;  Surgeon: Annis Kinder, DO;  Location: [facility];  Service: Gastroenterology;  Laterality: N/A;   ICD IMPLANT N/A 07/21/2021   Procedure: ICD IMPLANT;  Surgeon: Verona Goodwill, MD;  [facility];  Service: Cardiovascular;  Laterality: N/A;   NECK SURGERY     OVARIAN CYST SURGERY      POLYPECTOMY  02/26/2022   Procedure: POLYPECTOMY;  Surgeon: Annis Kinder, DO;  Location: [facility];  Service: Gastroenterology;;   RIGHT HEART CATH N/A 07/12/2023   Procedure: RIGHT HEART CATH;  Surgeon: Mardell Shade, MD;  Location: [facility] INVASIVE CV LAB;  Service: Cardiovascular;  Laterality: N/A;   RIGHT/LEFT HEART CATH AND CORONARY ANGIOGRAPHY N/A 01/23/2021   Procedure: RIGHT/LEFT HEART CATH AND CORONARY ANGIOGRAPHY;  Surgeon: Mardell Shade, MD;  Location: [facility] INVASIVE CV LAB;  Service: Cardiovascular;  Laterality: N/A;   TONSILLECTOMY      ROS: Review of Systems Negative except as stated above  PHYSICAL EXAM: BP 99/66   Pulse 76   Temp 98.1 F (36.7 C) (Oral)   Ht 5\' 10"  (1.778 m)   Wt 188 lb (85.3 kg)   SpO2 96%   BMI 26.98 kg/m   Wt Readings from Last 3 Encounters:  09/03/23 188 lb (85.3 kg)  08/26/23 190 lb 0.6 oz (86.2 kg)  08/15/23 183 lb 8 oz (83.2 kg)    Physical Exam  General appearance - alert, well appearing, middle-age Caucasian female and in no distress Mental status - normal mood, behavior, speech, dress, motor activity, and thought processes Mouth -tongue is dry. Neck - supple, no significant adenopathy Chest - clear to auscultation, no wheezes, rales or rhonchi, symmetric air entry Heart - normal rate, regular rhythm, normal S1, S2, no murmurs, rubs, clicks or gallops Abdomen -normal bowel sounds, nondistended, soft, slight right mid to low quadrant tenderness without guarding or rebound Extremities - peripheral pulses normal, no pedal edema, no clubbing or cyanosis  Results for orders placed or performed in visit on 09/03/23  POCT glucose (manual entry)   Collection Time: 09/03/23  2:12 PM  Result Value Ref Range   POC Glucose 103 (A) 70 - 99 mg/dl  POCT glycosylated hemoglobin (Hb A1C)   Collection Time: 09/03/23  2:20 PM  Result Value Ref Range   Hemoglobin A1C     HbA1c POC (<> result, manual entry)     HbA1c, POC  (prediabetic range)     HbA1c, POC (controlled diabetic range) 5.5 0.0 - 7.0 %  Basic Metabolic Panel   Collection Time: 09/03/23  3:21 PM  Result Value Ref Range   Glucose 98 70 - 99 mg/dL   BUN 14 6 - 24 mg/dL   Creatinine, Ser 6.96 (H) 0.57 - 1.00 mg/dL   eGFR 64 >29 BM/WUX/3.24   BUN/Creatinine Ratio 14 9 - 23   Sodium 140 134 - 144 mmol/L   Potassium 4.1 3.5 - 5.2 mmol/L   Chloride 105 96 - 106 mmol/L   CO2 15 (L) 20 - 29 mmol/L   Calcium  9.8 8.7 - 10.2 mg/dL       Latest Ref Rng & Units 09/03/2023    3:21 PM 08/26/2023    5:07 AM 08/25/2023   12:56 PM  CMP  Glucose 70 - 99 mg/dL 98  401  83   BUN 6 - 24 mg/dL 14  15  17    Creatinine 0.57 - 1.00 mg/dL 0.27  2.53  6.64   Sodium 134 - 144 mmol/L 140  136  135   Potassium 3.5 - 5.2 mmol/L 4.1  3.1  4.9   Chloride 96 - 106 mmol/L 105  106  105   CO2 20 - 29 mmol/L 15  23  25    Calcium  8.7 - 10.2 mg/dL 9.8  8.3  9.1   Total Protein 6.5 - 8.1 g/dL   6.4   Total Bilirubin 0.0 - 1.2 mg/dL   0.6   Alkaline Phos 38 - 126 U/L   90   AST 15 - 41 U/L   19   ALT 0 - 44 U/L   20    Lipid Panel     Component Value Date/Time   CHOL 185 03/01/2022 0339   TRIG 221 (H) 03/01/2022 0339   HDL 36 (L) 03/01/2022 0339   CHOLHDL 5.1 03/01/2022 0339   VLDL 44 (H) 03/01/2022 0339   LDLCALC 105 (H) 03/01/2022 0339    CBC    Component Value Date/Time   WBC 6.5 08/25/2023 1256   RBC 4.58 08/25/2023 1256   HGB 13.6 08/25/2023 1256   HGB 16.0 (H) 04/22/2023 1356   HCT 40.9 08/25/2023 1256   HCT 47.9 (H) 04/22/2023 1356   PLT 309 08/25/2023 1256   PLT 361 04/22/2023 1356   MCV 89.3 08/25/2023 1256   MCV 84 04/22/2023 1356   MCH 29.7 08/25/2023 1256   MCHC 33.3 08/25/2023 1256   RDW 13.7 08/25/2023 1256   RDW 12.5 04/22/2023 1356   LYMPHSABS 1.8 08/24/2023 1454   LYMPHSABS 1.6 04/22/2023 1356   MONOABS 0.9 08/24/2023 1454   EOSABS 0.3 08/24/2023 1454   EOSABS 0.5 (H) 04/22/2023 1356   BASOSABS 0.1 08/24/2023 1454   BASOSABS 0.1  04/22/2023 1356    ASSESSMENT AND PLAN: 1. Hospital discharge follow-up (Primary)   2. Hypotension due to hypovolemia Repeat blood pressure today is better than first reading but SBP still below 100.  She denies any dizziness.  Tongue is mildly dry.  Encouraged her to increase fluid intake.  She is walking a delicate line as we do want to avoid decompensation of CHF but at the same time volume status suggest that she still may be volume depleted.  If she decompensates in regards to the CHF, this can be easily managed by increasing her diuretics.  Keep upcoming appointment with cardiology later this week. - Monitor blood pressure regularly. - Avoid driving if experiencing dizziness.  3. Cold intolerance Of questionable etiology.  She is not anemic and TSH is normal.  I doubt it is due to vitamin D  deficiency.  Other possibility is that it may be due to one of her medications but she is on so many, it would be difficult to tease out.  In particular, I looked up the [place]  to make sure this would not cause it. I recommend adjusting the temperature/thermostat in her home during the overnight periods.  4. Crohn's disease with complication, unspecified gastrointestinal tract location [facility]) She may be having a mild flare.  I recommend that she get in contact with her gastroenterologist Dr. Karene Oto as soon as possible.  In the meantime, I will give several days of prednisone  - Instruct to seek emergency care if fever develops. - predniSONE  (DELTASONE ) 20 MG tablet; Take 1 tablet by mouth daily x 3 days then 1/2 tablet daily x 4 days  Dispense: 5 tablet; Refill: 0  5. Diabetes mellitus treated with oral medication (HCC) At goal. Continue Januvia  - POCT glycosylated hemoglobin (Hb A1C) - POCT glucose (manual entry) - Basic Metabolic Panel  6. Vitamin D  deficiency Continue Q wk vit D supplement   Patient was given the opportunity to ask questions.  Patient verbalized understanding of the plan  and was able to repeat key elements of the plan.   This documentation was completed using Paediatric nurse.  Any transcriptional errors are unintentional.  Orders Placed This Encounter  Procedures   Basic Metabolic Panel   POCT glycosylated hemoglobin (Hb A1C)   POCT glucose (manual entry)     Requested Prescriptions   Signed Prescriptions Disp Refills   atorvastatin  (LIPITOR ) 80 MG tablet 90 tablet 3    Sig: Take 1 tablet (80 mg total) by mouth daily.   predniSONE  (DELTASONE ) 20 MG tablet 5 tablet 0    Sig: Take 1 tablet by mouth daily x 3 days then 1/2 tablet daily x 4 days    No follow-ups on file.  Concetta Dee, MD, FACP

## 2023-09-04 ENCOUNTER — Telehealth: Payer: Self-pay | Admitting: Gastroenterology

## 2023-09-04 ENCOUNTER — Encounter: Payer: Self-pay | Admitting: Internal Medicine

## 2023-09-04 ENCOUNTER — Ambulatory Visit: Payer: Self-pay | Admitting: Internal Medicine

## 2023-09-04 DIAGNOSIS — K5000 Crohn's disease of small intestine without complications: Secondary | ICD-10-CM

## 2023-09-04 LAB — BASIC METABOLIC PANEL WITH GFR
BUN/Creatinine Ratio: 14 (ref 9–23)
BUN: 14 mg/dL (ref 6–24)
CO2: 15 mmol/L — ABNORMAL LOW (ref 20–29)
Calcium: 9.8 mg/dL (ref 8.7–10.2)
Chloride: 105 mmol/L (ref 96–106)
Creatinine, Ser: 1.03 mg/dL — ABNORMAL HIGH (ref 0.57–1.00)
Glucose: 98 mg/dL (ref 70–99)
Potassium: 4.1 mmol/L (ref 3.5–5.2)
Sodium: 140 mmol/L (ref 134–144)
eGFR: 64 mL/min/{1.73_m2} (ref >59)

## 2023-09-04 NOTE — Progress Notes (Signed)
 Remote ICD transmission.

## 2023-09-04 NOTE — Telephone Encounter (Signed)
 MyChart message sent to patient with recommendations.   CT enterography order in epic. Secure staff message sent to radiology scheduling to contact patient to set up appt.

## 2023-09-04 NOTE — Addendum Note (Signed)
 Addended by: Edra Govern D on: 09/04/2023 05:20 PM   Modules accepted: Orders

## 2023-09-04 NOTE — Telephone Encounter (Signed)
 Patient called and stated that she was recent hospitalized for low BP and there they discovered she has a low Vitamin D  level do to her malabsorption from her crohn's. Patient also stated that 3 days ago she has had on and off pain from the start point of under her breast bone down to her pubic bone, with that pain she also feels soreness and aching. Patient has recently been feeling chills and her PCP recommended that she let Dr. Karene Oto know to see what can be done. Patient is requesting a call back. Please advise.

## 2023-09-05 ENCOUNTER — Other Ambulatory Visit (HOSPITAL_COMMUNITY): Payer: Self-pay

## 2023-09-06 ENCOUNTER — Other Ambulatory Visit (HOSPITAL_COMMUNITY): Payer: Self-pay

## 2023-09-06 ENCOUNTER — Ambulatory Visit (HOSPITAL_COMMUNITY)
Admit: 2023-09-06 | Discharge: 2023-09-06 | Disposition: A | Discharge status: Home / Self Care | Attending: Adult Health | Admitting: Adult Health

## 2023-09-06 ENCOUNTER — Encounter (HOSPITAL_COMMUNITY)

## 2023-09-06 ENCOUNTER — Other Ambulatory Visit (HOSPITAL_BASED_OUTPATIENT_CLINIC_OR_DEPARTMENT_OTHER): Payer: Self-pay

## 2023-09-06 VITALS — BP 98/68 | HR 91 | Ht 70.0 in | Wt 187.0 lb

## 2023-09-06 DIAGNOSIS — I5042 Chronic combined systolic (congestive) and diastolic (congestive) heart failure: Secondary | ICD-10-CM | POA: Diagnosis not present

## 2023-09-06 DIAGNOSIS — Z79899 Other long term (current) drug therapy: Secondary | ICD-10-CM | POA: Diagnosis not present

## 2023-09-06 DIAGNOSIS — I340 Nonrheumatic mitral (valve) insufficiency: Secondary | ICD-10-CM | POA: Insufficient documentation

## 2023-09-06 DIAGNOSIS — I2510 Atherosclerotic heart disease of native coronary artery without angina pectoris: Secondary | ICD-10-CM | POA: Insufficient documentation

## 2023-09-06 DIAGNOSIS — R0602 Shortness of breath: Secondary | ICD-10-CM | POA: Insufficient documentation

## 2023-09-06 DIAGNOSIS — Z7901 Long term (current) use of anticoagulants: Secondary | ICD-10-CM | POA: Insufficient documentation

## 2023-09-06 DIAGNOSIS — G4733 Obstructive sleep apnea (adult) (pediatric): Secondary | ICD-10-CM | POA: Insufficient documentation

## 2023-09-06 DIAGNOSIS — Z87891 Personal history of nicotine dependence: Secondary | ICD-10-CM | POA: Diagnosis not present

## 2023-09-06 DIAGNOSIS — Z7984 Long term (current) use of oral hypoglycemic drugs: Secondary | ICD-10-CM | POA: Diagnosis not present

## 2023-09-06 DIAGNOSIS — I5022 Chronic systolic (congestive) heart failure: Secondary | ICD-10-CM | POA: Diagnosis not present

## 2023-09-06 DIAGNOSIS — Z5986 Financial insecurity: Secondary | ICD-10-CM | POA: Insufficient documentation

## 2023-09-06 DIAGNOSIS — I714 Abdominal aortic aneurysm, without rupture, unspecified: Secondary | ICD-10-CM | POA: Diagnosis not present

## 2023-09-06 DIAGNOSIS — D751 Secondary polycythemia: Secondary | ICD-10-CM | POA: Insufficient documentation

## 2023-09-06 DIAGNOSIS — R000 Tachycardia, unspecified: Secondary | ICD-10-CM | POA: Insufficient documentation

## 2023-09-06 MED ORDER — DAPAGLIFLOZIN PROPANEDIOL 10 MG PO TABS
10.0000 mg | ORAL_TABLET | Freq: Every day | ORAL | 3 refills | Status: AC
  Filled 2023-09-06 – 2023-09-16 (×2): qty 90, 90d supply, fill #0
  Filled 2023-12-12: qty 90, 90d supply, fill #1
  Filled 2024-01-01 – 2024-03-16 (×3): qty 90, 90d supply, fill #2

## 2023-09-06 MED ORDER — DAPAGLIFLOZIN PROPANEDIOL 10 MG PO TABS
10.0000 mg | ORAL_TABLET | Freq: Every day | ORAL | 3 refills | Status: DC
  Filled 2023-09-06: qty 90, 90d supply, fill #0

## 2023-09-06 NOTE — Progress Notes (Signed)
 ADVANCED HF CLINIC NOTE   PCP: Lawrance Presume, MD Cardiology: Dr. Theodis Fiscal HF Cardiologist: Dr. Julane Ny  Chief Complaint: Shortness of breath  HPI: Christine [name] is a 58 y.o. female with history of chronic tobacco use, ADHD, anxiety, depression, CAD, systolic HF   Presented to ED [birth year]/4/[birth year] with increased shortness of breath/tachycardia. Adderrall stopped. Echo with EF 20-25%,  LHC/RHC with single vessel CAD (occluded mLAD) elevated filling pressures and  moderately reduced CO. She did not undergo PCI as cMRI demonstrated nonviable myocardium in the infarct territory. CAD managed medically and she was placed on HF GDMT. ? blocker discontinued w/ reduced output.     EF did not improve. Repeat echo 05/11/21 EF < 20% LV severely dilated RV ok Mild MR. Referred for ICD.   CPX 05/2021 with very mild HF limitation with elevated Ve/VCO2 slope  S/p [employer] ICD 4/23.  Echo (4/24) showed improved EF 35-40%, garde 1 DD, RV ok  RHC on 07/12/23 that was c/w volume depletion w/ low CO. This improved w/ volume resuscitation in the cath lab (Initial hemodynamics RA 5, PA 20/11, PCW 8, FICK CI 1.96, TD CI 1.98, PAPi 1.8, Repeat hemodynamics after 300 cc fluid bolus PA 28/11, PW 10, FICK CI 2.1, TD CI 2.5). It was suspected she was keeping herself too dry w/ diuretics. She was instructed to reduce her lasix  in half but never did and continued to take lasix  80 mg bid + twice weekly metolazone .    Admitted 08/24/23 with shock in the setting of volume depletion. Hydrated w/ IVFs and diuretics and BP active meds held. Weaned off NE. She was restarted on corlanor  and low dose spironolactone . She was instructed to resume lasix  on 5/13 but at reduced dose of 40 mg bid. KCl regimen was also reduced. Metolazone  was discontinued. Farxiga  and Toprol  XL remained on hold at d/c.   Today she returns for HF follow up.Overall feeling fine. SBP at home 80-90s. She has intermittent symptoms. She is walking twice a day.  Able to wak 12-15 minutes twice a day. Occasionally short of breath. Denies PND/Orthopnea. Appetite ok. No fever or chills. Weight at home 187 stable.  Taking all medications.   ROS: All systems negative except as listed in HPI, PMH and Problem List.  SH:  Social History   Socioeconomic History   Marital status: Single    Spouse name: Not on file   Number of children: 0   Years of education: 16   Highest education level: Bachelor's degree (e.g., BA, AB, BS)  Occupational History   Not on file  Tobacco Use   Smoking status: Former    Current packs/day: 0.00    Types: Cigarettes    Quit date: 01/15/2021    Years since quitting: 2.6    Passive exposure: Past   Smokeless tobacco: Never  Vaping Use   Vaping status: Never Used  Substance and Sexual Activity   Alcohol use: Not Currently   Drug use: Never   Sexual activity: Not on file  Other Topics Concern   Not on file  Social History Narrative   Not on file   Social Drivers of Health   Financial Resource Strain: Medium Risk (09/03/2023)   Overall Financial Resource Strain (CARDIA)    Difficulty of Paying Living Expenses: Somewhat hard  Food Insecurity: No Food Insecurity (09/03/2023)   Hunger Vital Sign    Worried About Running Out of Food in the Last Year: Never true    Ran Out  of Food in the Last Year: Never true  Transportation Needs: No Transportation Needs (09/03/2023)   PRAPARE - Administrator, Civil Service (Medical): No    Lack of Transportation (Non-Medical): No  Physical Activity: Insufficiently Active (09/03/2023)   Exercise Vital Sign    Days of Exercise per Week: 5 days    Minutes of Exercise per Session: 20 min  Stress: Stress Concern Present (09/03/2023)   [employer] of Occupational Health - Occupational Stress Questionnaire    Feeling of Stress : Rather much  Social Connections: Socially Isolated (09/03/2023)   Social Connection and Isolation Panel [NHANES]    Frequency of Communication  with Friends and Family: More than three times a week    Frequency of Social Gatherings with Friends and Family: Three times a week    Attends Religious Services: Never    Active Member of Clubs or Organizations: No    Attends Banker Meetings: Never    Marital Status: Never married  Intimate Partner Violence: Not At Risk (09/03/2023)   Humiliation, Afraid, Rape, and Kick questionnaire    Fear of Current or Ex-Partner: No    Emotionally Abused: No    Physically Abused: No    Sexually Abused: No   FH:  Family History  Problem Relation Age of Onset   Eczema Mother    Stroke Mother    Atrial fibrillation Mother    Heart failure Father    Asthma Brother    Stomach cancer Maternal Grandfather    Liver cancer Neg Hx    Esophageal cancer Neg Hx    Colon polyps Neg Hx    Colon cancer Neg Hx    Allergic rhinitis Neg Hx    Angioedema Neg Hx    Urticaria Neg Hx    Past Medical History:  Diagnosis Date   Allergy     Anxiety 01/20/2021   CAD in native artery 08/24/2021   CHF (congestive heart failure) (HCC)    Chronic combined systolic and diastolic heart failure (HCC) 08/24/2021   Complication of anesthesia    Crohn's colitis (HCC)    Depression    Hyperlipidemia    Myocardial infarction [facility])    Presence of cardiac defibrillator 07/2021   Shortness of breath 01/20/2021   Sleep apnea 07/2021   Tobacco abuse 01/20/2021   Urticaria    Current Outpatient Medications  Medication Sig Dispense Refill   Accu-Chek Softclix Lancets lancets Use as instructed.  Check blood sugar daily before breakfast. 100 each 12   acetaminophen  (TYLENOL ) 500 MG tablet Take 1,000 mg by mouth 3 (three) times daily as needed for moderate pain or headache.     ALPRAZolam  (XANAX ) 1 MG tablet Take 2 mg by mouth 3 (three) times daily.     alum & mag hydroxide-simeth (MAALOX/MYLANTA) 200-200-20 MG/5ML suspension Take 30 mLs by mouth every 4 (four) hours as needed for indigestion or heartburn. 355  mL 0   apixaban  (ELIQUIS ) 5 MG TABS tablet Take 1 tablet (5 mg total) by mouth 2 (two) times daily. 60 tablet 11   atorvastatin  (LIPITOR ) 80 MG tablet Take 1 tablet (80 mg total) by mouth daily. 90 tablet 3   Blood Glucose Monitoring Suppl (ACCU-CHEK GUIDE) w/Device KIT Check blood sugar once daily before breakfast 1 kit 0   buPROPion  (WELLBUTRIN  XL) 150 MG 24 hr tablet Take 150 mg by mouth daily.     cetirizine  (ZYRTEC  ALLERGY ) 10 MG tablet Take 1 tablet (10 mg total) by mouth  2 (two) times daily. 60 tablet 5   cyclobenzaprine  (FLEXERIL ) 10 MG tablet Take 10 mg by mouth 3 (three) times daily as needed for muscle spasms.     diphenoxylate -atropine  (LOMOTIL ) 2.5-0.025 MG tablet Take 2 tablets by mouth 4 (four) times daily as needed for diarrhea/ loose stools. 60 tablet 3   Eszopiclone  3 MG TABS Take 1 tablet (3 mg total) by mouth at bedtime. Take immediately before bedtime 30 tablet 0   famotidine  (PEPCID ) 20 MG tablet Take 1 tablet (20 mg total) by mouth 2 (two) times daily. 60 tablet 5   feeding supplement (ENSURE ENLIVE / ENSURE PLUS) LIQD Take 237 mLs by mouth 2 (two) times daily between meals. 237 mL 12   Fezolinetant  (VEOZAH ) 45 MG TABS Take 1 tablet (45 mg total) by mouth daily. 30 tablet 2   fluticasone  (FLONASE ) 50 MCG/ACT nasal spray Place 1-2 sprays into both nostrils daily as needed (nasal congestion). 16 g 5   furosemide  (LASIX ) 20 MG tablet Take 2 tablets (40 mg total) by mouth 2 (two) times daily. 120 tablet 3   glucose blood test strip Use as instructed to check blood sugar daily before breakfast 100 each 6   ivabradine  (CORLANOR ) 5 MG TABS tablet Take 1/2 tablet (2.5 mg total) by mouth 2 (two) times daily. 30 tablet 5   lipase/protease/amylase (CREON ) 36000 UNITS CPEP capsule Take 1 capsule (36,000 Units total) by mouth in the morning, at noon, and at bedtime. 270 capsule 1   metroNIDAZOLE  (METROGEL ) 1 % gel Apply 1 Application topically daily as needed (rash).     Multiple  Vitamins-Minerals (MULTIVITAMIN WITH MINERALS) tablet Take 1 tablet by mouth daily.     oxymetazoline (AFRIN) 0.05 % nasal spray Place 1-2 sprays into both nostrils 2 (two) times daily as needed for congestion.     potassium chloride  SA (KLOR-CON  M) 20 MEQ tablet Take 4 tablets (80 mEq total) by mouth 2 (two) times daily. 240 tablet 3   predniSONE  (DELTASONE ) 20 MG tablet Take 1 tablet by mouth daily x 3 days then 1/2 tablet daily x 4 days 5 tablet 0   promethazine  (PHENERGAN ) 25 MG tablet Take 1 tablet (25 mg total) by mouth every 6 (six) to 8 (eight) hours for nausea or vomiting. 90 tablet 3   sitaGLIPtin  (JANUVIA ) 50 MG tablet Take 1 tablet (50 mg total) by mouth daily. 90 tablet 1   spironolactone  (ALDACTONE ) 25 MG tablet Take 0.5 tablets (12.5 mg total) by mouth at bedtime. 15 tablet 3   valACYclovir  (VALTREX ) 500 MG tablet Take 1 tablet (500 mg total) by mouth 2 (two) times daily. (Patient taking differently: Take 500 mg by mouth 2 (two) times daily as needed (outbreaks).) 6 tablet 2   vedolizumab  (ENTYVIO ) 300 MG injection Inject 300 mg into the vein every 2 (two) months.     Vitamin D , Ergocalciferol , (DRISDOL ) 1.25 MG (50000 UNIT) CAPS capsule Take 1 capsule (50,000 Units total) by mouth every 7 (seven) days. 8 capsule 0   triamcinolone  cream (KENALOG ) 0.1 % Apply 1 Application topically daily as needed (irritation). (Patient not taking: Reported on 09/06/2023)     No current facility-administered medications for this encounter.   BP 98/68 (BP Location: Left Arm, Patient Position: Sitting, Cuff Size: Normal)   Pulse 91   Ht 5\' 10"  (1.778 m)   Wt 84.8 kg (187 lb)   SpO2 95%   BMI 26.83 kg/m   Wt Readings from Last 3 Encounters:  09/06/23 84.8  kg (187 lb)  09/03/23 85.3 kg (188 lb)  08/26/23 86.2 kg (190 lb 0.6 oz)   PHYSICAL EXAM: General:   No resp difficulty Neck: supple. no JVD.  Cor: PMI nondisplaced. Regular rate & rhythm. No rubs, gallops or murmurs. Lungs: clear Abdomen:  soft, nontender, nondistended.  Extremities: no cyanosis, clubbing, rash, edema Neuro: alert & oriented x3   ASSESSMENT & PLAN:  1. 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 Echo EF 20-25%, severely dilated LV, RV okay, moderate MR - R/LHC (10/22): 1v CAD occluded mid LAD PCWP 27,  Fick 4.7/2.  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 No viability. RV okay. Not sure how to explain inferior defects on cMRI  - Echo (05/11/21) EF < 20% RV ok - CPX 1/23 with very mild HF limitation:  Peak VO2: 18.8 (90% predicted peak VO2) VE/VCO2 slope: 33 Peak RER: 1.14  - s/p [employer] ICD 4/23. - Echo (4/24): EF 35-40%, RV ok - Echo (3/25): EF 30-35%, RV low normal - NYHA II. ReDs reading: 35 %, normal - Hold off bb   - Continue Ivabradine  2.5 mg bid.  - Continue spiro 12.5 mg daily  - Restart farxiga   10 mg daily  - Continue Lasix  40 mg bid  - I reviewed BMET  from 09/03/23 , stable.  - Set up CPX.   2. CAD - Single vessel LAD occlusion on LHC. - No chest pain.  - Continue atorvastatin  80 mg daily.  - No ASA with Eliquis  use   3. H/O LV apical thrombus -  Continue Eliquis . No bleeding  4. OSA - Mild on sleep study, AHI 11.8 -On CPAP.  5. Polycythemia - Follows with Heme/Onc. - JAK negative  9. AAA - CT scan 11/23  3.1cm - Follow u/s in 3 years recommended (02/2025)  Follow up with Dr Ascencion Black in a couple of months.   Christine Bars, NP  1:49 PM

## 2023-09-06 NOTE — Progress Notes (Signed)
 ReDS Vest / Clip - 09/06/23 1407       ReDS Vest / Clip   Station Marker C    Ruler Value 23    ReDS Value Range Low volume    ReDS Actual Value 35

## 2023-09-06 NOTE — Patient Instructions (Signed)
 Medication Changes:  START: FARXIGA  10MG  ONCE DAILY   Testing/Procedures:  YOU HAVE BEEN ADDED TO CPX LIST- SOMEONE WILL REACH OUT TO YOU TO GET YOU SCHEDULED FOR THIS ONCE SCHEDULE OPENS   Follow-Up in: 6 WEEKS WITH DR. Julane Ny PLEASE CALL OUR OFFICE AROUND JUNE TO GET SCHEDULED FOR YOUR APPOINTMENT. PHONE NUMBER IS [phone] OPTION 2   At the [facility], you and your health needs are our priority. We have a designated team specialized in the treatment of Heart Failure. This Care Team includes your primary Heart Failure Specialized Cardiologist (physician), Advanced Practice Providers (APPs- Physician Assistants and Nurse Practitioners), and Pharmacist who all work together to provide you with the care you need, when you need it.   You may see any of the following providers on your designated Care Team at your next follow up:  Dr. Jules Oar Dr. Peder Bourdon Dr. Alwin Baars Dr. Judyth Nunnery Nieves Bars, NP Ruddy Corral, [place] [facility] [place], [place] Dennise Fitz, NP [place] Lee, NP Luster Salters, PharmD   Please be sure to bring in all your medications bottles to every appointment.   Need to Contact Us :  If you have any questions or concerns before your next appointment please send us  a message through [place] or call our office at [phone].    TO LEAVE A MESSAGE FOR THE NURSE SELECT OPTION 2, PLEASE LEAVE A MESSAGE INCLUDING: YOUR NAME DATE OF BIRTH CALL BACK NUMBER REASON FOR CALL**this is important as we prioritize the call backs  YOU WILL RECEIVE A CALL BACK THE SAME DAY AS LONG AS YOU CALL BEFORE 4:00 PM

## 2023-09-07 ENCOUNTER — Other Ambulatory Visit (HOSPITAL_COMMUNITY): Payer: Self-pay | Admitting: Internal Medicine

## 2023-09-07 ENCOUNTER — Other Ambulatory Visit (HOSPITAL_COMMUNITY): Payer: Self-pay

## 2023-09-10 ENCOUNTER — Ambulatory Visit: Attending: Cardiology

## 2023-09-10 DIAGNOSIS — I5042 Chronic combined systolic (congestive) and diastolic (congestive) heart failure: Secondary | ICD-10-CM

## 2023-09-10 DIAGNOSIS — Z95810 Presence of automatic (implantable) cardiac defibrillator: Secondary | ICD-10-CM | POA: Diagnosis not present

## 2023-09-12 ENCOUNTER — Ambulatory Visit (HOSPITAL_COMMUNITY)
Admission: RE | Admit: 2023-09-12 | Discharge: 2023-09-12 | Disposition: A | Discharge status: Home / Self Care | Source: Ambulatory Visit | Attending: Gastroenterology | Admitting: Gastroenterology

## 2023-09-12 DIAGNOSIS — K5000 Crohn's disease of small intestine without complications: Secondary | ICD-10-CM | POA: Insufficient documentation

## 2023-09-12 MED ORDER — IOHEXOL 300 MG/ML  SOLN
100.0000 mL | Freq: Once | INTRAMUSCULAR | Status: AC | PRN
  Administered 2023-09-12: 100 mL via INTRAVENOUS

## 2023-09-13 ENCOUNTER — Encounter (HOSPITAL_COMMUNITY)

## 2023-09-13 ENCOUNTER — Encounter (HOSPITAL_COMMUNITY): Payer: Self-pay

## 2023-09-13 ENCOUNTER — Encounter: Payer: Self-pay | Admitting: Gastroenterology

## 2023-09-13 MED FILL — Ivabradine HCl Tab 5 MG (Base Equiv): ORAL | 30 days supply | Qty: 30 | Fill #1 | Status: AC

## 2023-09-13 NOTE — Progress Notes (Signed)
 EPIC Encounter for ICM Monitoring  Patient Name: Christine [name] is a 58 y.o. female Date: 09/13/2023 Primary Care Physican: Lawrance Presume, MD Primary Cardiologist: [place] Electrophysiologist: Daneil Dunker 12/19/2022 Weight: 179 lbs  02/06/2023 Weight: 182.5 lbs 02/13/2023 Weight: 181 lbs  03/12/2023 Weight: 183 lbs  04/16/2023 Weight: 183 lbs 05/27/2023 Weight: 182 - 183 lbs (baseline)          06/24/2023 Weight: 181 lbs 06/26/2023 Weight: 188 lbs          08/02/2023 Weight: 184-185 lbs  09/13/2023 Weight: 184 lbs   (6 lbs less than hospitalization)                                Spoke with patient and heart failure questions reviewed.  Transmission results reviewed.  Pt asymptomatic for fluid accumulation.  She thinks she may be having a Crohns flare.  She has lost her appetite, chills, sweats, and can feel her food moving through her intestine and has nausea.   Hospitalized 5/10 with dx of cardiogenic shock.   HeartLogic Heart Failure Index remains 7 suggesting fluid levels are within normal threshold range.   Thoracic impedance trending down 5/10 which correlates with hospitalization and now trending back up.   Prescribed:  Furosemide  20 mg take 2 tablet(s) (40 mg total) by mouth twice a day Potassium 20 mEq take 4 tablet(s) (80 mEq total) by mouth two (2) times a day Spironolactone  25 mg take 1 tablet by mouth daily.    Labs: 09/03/2023 Creatinine 1.03, BUN 14, Potassium 4.1, Sodium 140  08/26/2023 Creatinine 1.08, BUN 15, Potassium 3.1, Sodium 136, GFR >60  08/25/2023 Creatinine 1.32, BUN 17, Potassium 4.9, Sodium 135, GFR 48  08/24/2023 Creatinine 1.31, BUN 25, Potassium 4.9, Sodium 137, GFR 48 07/29/2023 Creatinine 1.47, BUN 25, Potassium 3.2, Sodium 137  07/12/2023 Creatinine NA, BUN NA, Potassium 3.1, Sodium 136 (8:16 AM)  07/12/2023 Creatinine 1.49, BUN 33, Potassium 4.0, Sodium 131, GFR 41 (6:38 AM)  07/09/2023 Creatinine 1.46, BUN 32, Potassium 3.3, Sodium 135, GFR  42 A complete set of results can be found in Results Review.   Recommendations:  She is following up with GI physician through mychart.  She will go to ER if symptoms get worse over the weekend.     Follow-up plan: ICM clinic phone appointment on 10/28/2023.   91 day device clinic remote transmission 10/22/2023.              EP/Cardiology next office visit:  Recall 11/18/2023 with Dr Julane Ny.   Recall 03/15/2024 with Dr  Rodolfo Clan.          Copy of ICM check sent to Dr. Daneil Dunker.     3 Month HeartLogicT Heart Failure Index:    8 Day Data Trend:          Almyra Jain, RN 09/13/2023 4:05 PM

## 2023-09-14 ENCOUNTER — Other Ambulatory Visit (HOSPITAL_COMMUNITY): Payer: Self-pay

## 2023-09-16 ENCOUNTER — Telehealth: Payer: Self-pay | Admitting: Cardiology

## 2023-09-16 ENCOUNTER — Other Ambulatory Visit (HOSPITAL_BASED_OUTPATIENT_CLINIC_OR_DEPARTMENT_OTHER): Payer: Self-pay

## 2023-09-16 ENCOUNTER — Ambulatory Visit: Payer: Self-pay | Admitting: Gastroenterology

## 2023-09-16 ENCOUNTER — Other Ambulatory Visit (HOSPITAL_COMMUNITY): Payer: Self-pay

## 2023-09-16 ENCOUNTER — Other Ambulatory Visit: Payer: Self-pay

## 2023-09-16 MED ORDER — DICYCLOMINE HCL 10 MG PO CAPS
10.0000 mg | ORAL_CAPSULE | Freq: Four times a day (QID) | ORAL | 3 refills | Status: AC | PRN
  Filled 2023-09-16 (×2): qty 60, 15d supply, fill #0
  Filled 2024-05-13: qty 60, 15d supply, fill #1

## 2023-09-16 NOTE — Telephone Encounter (Signed)
 Outpatient service line: Hypotension and dizziness  Patient followed by advanced heart failure with recent admission for cardiogenic shock but overall felt to be volume depleted and was on aggressive diuretic regiment.  Her current symptoms continue to sound like an issue with volume depletion versus low cardiac output.  She has had GI issues and has been noting intermittent diarrhea and lack of appetite.  Has Crohn's disease.  Blood pressure around 105 systolic.  Also reports being 182 pounds.  At her most recent follow-up a couple weeks ago she was around 188 pounds.  Given lack of symptoms of volume congestion (no orthopnea, peripheral edema, shortness of breath) I think reasonable to pull back her Lasix  to 40 mg once a day instead of twice daily.  Always can go up on this for any signs of weight gain or shortness of breath.  Will route note to advance heart failure for any other recommendations.

## 2023-09-17 ENCOUNTER — Ambulatory Visit: Payer: Medicaid Other | Admitting: Internal Medicine

## 2023-09-17 NOTE — Telephone Encounter (Signed)
 Agree with your plan. Thank you.   Sherae Santino NP-C  8:48 AM

## 2023-09-17 NOTE — Telephone Encounter (Signed)
 Pt made aware of Dr. Karene Oto recommendations: Pt was scheduled to see Dr. Karene Oto on 09/18/2023 at 3:40 PM. Pt made aware. Pt verbalized understanding with all questions answered.

## 2023-09-18 ENCOUNTER — Encounter: Payer: Self-pay | Admitting: Gastroenterology

## 2023-09-18 ENCOUNTER — Ambulatory Visit (INDEPENDENT_AMBULATORY_CARE_PROVIDER_SITE_OTHER): Admitting: Gastroenterology

## 2023-09-18 ENCOUNTER — Other Ambulatory Visit (INDEPENDENT_AMBULATORY_CARE_PROVIDER_SITE_OTHER)

## 2023-09-18 VITALS — BP 108/70 | HR 72 | Ht 70.0 in | Wt 184.2 lb

## 2023-09-18 DIAGNOSIS — E559 Vitamin D deficiency, unspecified: Secondary | ICD-10-CM | POA: Diagnosis not present

## 2023-09-18 DIAGNOSIS — R109 Unspecified abdominal pain: Secondary | ICD-10-CM | POA: Diagnosis not present

## 2023-09-18 DIAGNOSIS — E119 Type 2 diabetes mellitus without complications: Secondary | ICD-10-CM

## 2023-09-18 DIAGNOSIS — K8681 Exocrine pancreatic insufficiency: Secondary | ICD-10-CM

## 2023-09-18 DIAGNOSIS — R630 Anorexia: Secondary | ICD-10-CM | POA: Diagnosis not present

## 2023-09-18 DIAGNOSIS — R110 Nausea: Secondary | ICD-10-CM

## 2023-09-18 DIAGNOSIS — R5383 Other fatigue: Secondary | ICD-10-CM

## 2023-09-18 DIAGNOSIS — Z79899 Other long term (current) drug therapy: Secondary | ICD-10-CM

## 2023-09-18 DIAGNOSIS — K50012 Crohn's disease of small intestine with intestinal obstruction: Secondary | ICD-10-CM

## 2023-09-18 DIAGNOSIS — K50919 Crohn's disease, unspecified, with unspecified complications: Secondary | ICD-10-CM | POA: Diagnosis not present

## 2023-09-18 DIAGNOSIS — Z7984 Long term (current) use of oral hypoglycemic drugs: Secondary | ICD-10-CM

## 2023-09-18 DIAGNOSIS — R194 Change in bowel habit: Secondary | ICD-10-CM | POA: Diagnosis not present

## 2023-09-18 DIAGNOSIS — Z5181 Encounter for therapeutic drug level monitoring: Secondary | ICD-10-CM

## 2023-09-18 LAB — IBC + FERRITIN
Ferritin: 42.7 ng/mL (ref 10.0–291.0)
Iron: 65 ug/dL (ref 42–145)
Saturation Ratios: 17.3 % — ABNORMAL LOW (ref 20.0–50.0)
TIBC: 375.2 ug/dL (ref 250.0–450.0)
Transferrin: 268 mg/dL (ref 212.0–360.0)

## 2023-09-18 NOTE — Patient Instructions (Addendum)
 _______________________________________________________  If your blood pressure at your visit was 140/90 or greater, please contact your primary care physician to follow up on this.  _______________________________________________________  If you are age 56 or older, your body mass index should be between 23-30. Your Body mass index is 26.44 kg/m. If this is out of the aforementioned range listed, please consider follow up with your Primary Care Provider.  If you are age 35 or younger, your body mass index should be between 19-25. Your Body mass index is 26.44 kg/m. If this is out of the aformentioned range listed, please consider follow up with your Primary Care Provider.   ________________________________________________________  Your provider has requested that you go to the basement level for lab work before leaving today. Press "B" on the elevator. The lab is located at the first door on the left as you exit the elevator.  Please purchase the following medications over the counter and take as directed:  START: Benefiber daily as directed.  We have referred you to Endocrinology.  Someone from their office should contact you with an appointment.  Due to recent changes in healthcare laws, you may see the results of your imaging and laboratory studies on MyChart before your provider has had a chance to review them.  We understand that in some cases there may be results that are confusing or concerning to you. Not all laboratory results come back in the same time frame and the provider may be waiting for multiple results in order to interpret others.  Please give us  48 hours in order for your provider to thoroughly review all the results before contacting the office for clarification of your results.   The [facility] providers would like to encourage you to use MYCHART to communicate with providers for non-urgent requests or questions.  Due to long hold times on the telephone, sending your  provider a message by [facility] may be a faster and more efficient way to get a response.  Please allow 48 business hours for a response.  Please remember that this is for non-urgent requests.  _______________________________________________________  Low-FODMAP Eating Plan  FODMAP stands for fermentable oligosaccharides, disaccharides, monosaccharides, and polyols. These are sugars that are hard for some people to digest. A low-FODMAP eating plan may help some people who have irritable bowel syndrome (IBS) and certain other bowel (intestinal) diseases to manage their symptoms. This meal plan can be complicated to follow. Work with a diet and nutrition specialist (dietitian) to make a low-FODMAP eating plan that is right for you. A dietitian can help make sure that you get enough nutrition from this diet. What are tips for following this plan? Reading food labels Check labels for hidden FODMAPs such as: High-fructose syrup. Honey. Agave. Natural fruit flavors. Onion or garlic powder. Choose low-FODMAP foods that contain 3-4 grams of fiber per serving. Check food labels for serving sizes. Eat only one serving at a time to make sure FODMAP levels stay low. Shopping Shop with a list of foods that are recommended on this diet and make a meal plan. Meal planning Follow a low-FODMAP eating plan for up to 6 weeks, or as told by your health care provider or dietitian. To follow the eating plan: Eliminate high-FODMAP foods from your diet completely. Choose only low-FODMAP foods to eat. You will do this for 2-6 weeks. Gradually reintroduce high-FODMAP foods into your diet one at a time. Most people should wait a few days before introducing the next new high-FODMAP food into their  meal plan. Your dietitian can recommend how quickly you may reintroduce foods. Keep a daily record of what and how much you eat and drink. Make note of any symptoms that you have after eating. Review your daily record with a  dietitian regularly to identify which foods you can eat and which foods you should avoid. General tips Drink enough fluid each day to keep your urine pale yellow. Avoid processed foods. These often have added sugar and may be high in FODMAPs. Avoid most dairy products, whole grains, and sweeteners. Work with a dietitian to make sure you get enough fiber in your diet. Avoid high FODMAP foods at meals to manage symptoms. Recommended foods Fruits Bananas, oranges, tangerines, lemons, limes, blueberries, raspberries, strawberries, grapes, cantaloupe, honeydew melon, kiwi, papaya, passion fruit, and pineapple. Limited amounts of dried cranberries, banana chips, and shredded coconut. Vegetables Eggplant, zucchini, cucumber, peppers, green beans, bean sprouts, lettuce, arugula, kale, Swiss chard, spinach, collard greens, bok choy, summer squash, potato, and tomato. Limited amounts of corn, carrot, and sweet potato. Green parts of scallions. Grains Gluten-free grains, such as rice, oats, buckwheat, quinoa, corn, polenta, and millet. Gluten-free pasta, bread, or cereal. Rice noodles. Corn tortillas. Meats and other proteins Unseasoned beef, pork, poultry, or fish. Eggs. Helene Loader. Tofu (firm) and tempeh. Limited amounts of nuts and seeds, such as almonds, walnuts, [place] nuts, pecans, peanuts, nut butters, pumpkin seeds, chia seeds, and sunflower seeds. Dairy Lactose-free milk, yogurt, and kefir. Lactose-free cottage cheese and ice cream. Non-dairy milks, such as almond, coconut, hemp, and rice milk. Non-dairy yogurt. Limited amounts of goat cheese, brie, mozzarella, parmesan, swiss, and other hard cheeses. Fats and oils Butter-free spreads. Vegetable oils, such as olive, canola, and sunflower oil. Seasoning and other foods Artificial sweeteners with names that do not end in "ol," such as aspartame, saccharine, and stevia. Maple syrup, white table sugar, raw sugar, brown sugar, and molasses. Mayonnaise, soy  sauce, and tamari. Fresh basil, coriander, parsley, rosemary, and thyme. Beverages Water and mineral water. Sugar-sweetened soft drinks. Small amounts of orange juice or cranberry juice. Black and green tea. Most dry wines. Coffee. The items listed above may not be a complete list of foods and beverages you can eat. Contact a dietitian for more information. Foods to avoid Fruits Fresh, dried, and juiced forms of apple, pear, watermelon, peach, plum, cherries, apricots, blackberries, boysenberries, figs, nectarines, and mango. Avocado. Vegetables Chicory root, artichoke, asparagus, cabbage, snow peas, Brussels sprouts, broccoli, sugar snap peas, mushrooms, celery, and cauliflower. Onions, garlic, leeks, and the white part of scallions. Grains Wheat, including kamut, durum, and semolina. Barley and bulgur. Couscous. Wheat-based cereals. Wheat noodles, bread, crackers, and pastries. Meats and other proteins Fried or fatty meat. Sausage. Cashews and pistachios. Soybeans, baked beans, black beans, chickpeas, kidney beans, fava beans, navy beans, lentils, black-eyed peas, and split peas. Dairy Milk, yogurt, ice cream, and soft cheese. Cream and sour cream. Milk-based sauces. Custard. Buttermilk. Soy milk. Seasoning and other foods Any sugar-free gum or candy. Foods that contain artificial sweeteners such as sorbitol, mannitol, isomalt, or xylitol. Foods that contain honey, high-fructose corn syrup, or agave. Bouillon, vegetable stock, beef stock, and chicken stock. Garlic and onion powder. Condiments made with onion, such as hummus, chutney, pickles, relish, salad dressing, and salsa. Tomato paste. Beverages Chicory-based drinks. Coffee substitutes. Chamomile tea. Fennel tea. Sweet or fortified wines such as port or sherry. Diet soft drinks made with isomalt, mannitol, maltitol, sorbitol, or xylitol. Apple, pear, and mango juice. Juices with high-fructose corn syrup.  The items listed above may not be a  complete list of foods and beverages you should avoid. Contact a dietitian for more information. Summary FODMAP stands for fermentable oligosaccharides, disaccharides, monosaccharides, and polyols. These are sugars that are hard for some people to digest. A low-FODMAP eating plan is a short-term diet that helps to ease symptoms of certain bowel diseases. The eating plan usually lasts up to 6 weeks. After that, high-FODMAP foods are reintroduced gradually and one at a time. This can help you find out which foods may be causing symptoms. A low-FODMAP eating plan can be complicated. It is best to work with a dietitian who has experience with this type of plan. This information is not intended to replace advice given to you by your health care provider. Make sure you discuss any questions you have with your health care provider. Document Revised: 03/17/2023 Document Reviewed: 03/17/2023 Elsevier Patient Education  [employer].   It was a pleasure to see you today!  Vito Cirigliano, D.O.

## 2023-09-18 NOTE — Progress Notes (Signed)
 Chief Complaint:    Nausea, decreased appetite, change in bowel habits, abdominal pain  GI History: Ms. Weseman is a 58 year old female with HFrEF (LVEF <20%) and CAD c/b LAD occlusion s/p ICD, chronic tobacco use,OSA on CPAP, history of LV thrombus on eliquis , Raynaud's, and ADHD, anxiety/depression, and PTSD follows in the GI clinic for the following:   1) Crohn's Disease (stricturing ileitis).  Started with watery diarrhea in 08/2021.  Index symptoms of 8-10 loose, watery stools with fecal urgency.  No nocturnal symptoms.  Loss 6# with decreased p.o. intake. - 11/28/2021: ER evaluation for diarrhea.  WBC 13.3, ALT 56, ALP 127, otherwise unremarkable work-up.  Was given Keflex  to treat UTI - 11/29/2021: Negative/normal GI PCR panel, O&P - 12/14/2021: Initial appointment in the GI clinic - 12/21/2021: CT A/P: Moderate colonic stool burden but otherwise no intra-abdominal pathology - 02/26/2022: EGD: Normal - 02/26/2022: Colonoscopy: 3 mm sigmoid HP, sigmoid diverticulosis, otherwise normal colon (path benign).  Terminal ileum with moderate stenosis measuring 1 cm in length by 5 mm in diameter which was not traversable.  This was located 3 cm from the ileocecal valve.  Terminal ileum contained a few ulcers proximal and distal to stricture (path: Acute ileitis with ulceration) - 03/01/2022: CT: No acute intra-abdominal pathology.  Stable 3.1 cm infrarenal abdominal aortic aneurysm.  Submucosal fatty infiltration in the terminal ileum with superimposed mucosal hyperemia suspicious for mild, acute on chronic terminal ileitis - 03/02/2022: CT enterography: Ileitis throughout a 15 cm segment of ileum with 2 segments of luminal narrowing/stricturing through this segment of distal ileum, c/w stricturing, ileal Crohn's Disease.  Negative/normal lactoferrin, calprotectin, repeat GI PCR panel. Fecal pancreatic elastase <50.  Was started on Entocort 9 mg/day x 8 weeks - 03/13/2022: GI follow-up appointment.   Reports overall decreased stool frequency with Entocort.  ESR 52, normal CRP. - 07/04/2022: GI follow-up.  Was doing well, but return of diarrhea couple days later.  Restarted budesonide  with good clinical response - 07/18/2022: CT enterography: Mucosal hyperenhancement and 2 short segment strictures in the TI, unchanged from previous.  Mild distal small bowel partial obstruction without penetrating disease, fistula, abscess - 07/20/2022: GI follow-up appointment.  Calprotectin 294.  Started on Entyvio .  C. difficile positive.  Treated with vancomycin  - 12/2022: Vedolizumab  trough 12, no antibody  HPI:     Patient is a 58 y.o. female presenting to the Gastroenterology Clinic for follow-up.  Was last seen by me in the office on 05/06/2023.  GI symptoms were controlled at that time.  Crohn's was in clinical remission on Entyvio  with trough essentially at goal and taking Creon  once daily.  Since then, hospital admission in May for hypovolemic shock with hypotension requiring IV fluid, Levophed , and was able to restart low-dose spironolactone  and Lasix .  Metolazone  discontinued.  Was seen in follow-up by her PCP on 5/20 and has beenc/o chills and feeling cold since early April.  Typically worse in the morning and has been using extra blankets and longsleeve clothing has been taking ergocalciferol  for vitamin D  deficiency.  Was given low-dose prednisone .  Had follow-up in the Heart Failure clinic on 5/23, and reduced Lasix  to 40 mg daily yesterday (from twice daily).  Today, she states she has been having nausea (no emesis) with trouble eating and has lost 8-9# over the last 3 weeks or so. Feels "food moving" through her intestines.  Has had associated abdominal cramping in variable locations throughout the abdomen.  Diarrhea every 2 days or so. Will have  3 loose to watery stools and urgency on those days. Can have fecal seepage on those days. No hematochezia or melena. No fiber supplement and does not maintain  high fiber diet. Increased Creon  to TID with meals to see if this would make a difference.  Was prescribed dicyclomine, but did not start.  Has been having chills with goosebumps and hot flashes. Taking Veozah  for menopausal sxs (Abdominal pain 4%, diarrhea 4%).    Labs from April with vedolizumab  trough essentially at goal at 11 with no detectable antibodies, mildly elevated ESR 41 (stable from previous) and normal CRP, normal calprotectin, all consistent with Crohn's remission.  Additionally, underwent CT enterography on 5/29 which showed only mild chronic inflammatory change of the terminal ileum without active inflammation, but otherwise normal-appearing GI tract.   Review of systems:     No chest pain, no SOB, no fevers, no urinary sx   Past Medical History:  Diagnosis Date   Allergy     Anxiety 01/20/2021   CAD in native artery 08/24/2021   CHF (congestive heart failure) (HCC)    Chronic combined systolic and diastolic heart failure (HCC) 08/24/2021   Complication of anesthesia    Crohn's colitis (HCC)    Depression    Hyperlipidemia    Myocardial infarction [facility])    Presence of cardiac defibrillator 07/2021   Shortness of breath 01/20/2021   Sleep apnea 07/2021   Tobacco abuse 01/20/2021   Urticaria     Patient's surgical history, family medical history, social history, medications and allergies were all reviewed in Epic    Current Outpatient Medications  Medication Sig Dispense Refill   Accu-Chek Softclix Lancets lancets Use as instructed.  Check blood sugar daily before breakfast. 100 each 12   acetaminophen  (TYLENOL ) 500 MG tablet Take 1,000 mg by mouth 3 (three) times daily as needed for moderate pain or headache.     ALPRAZolam  (XANAX ) 1 MG tablet Take 2 mg by mouth 3 (three) times daily.     alum & mag hydroxide-simeth (MAALOX/MYLANTA) 200-200-20 MG/5ML suspension Take 30 mLs by mouth every 4 (four) hours as needed for indigestion or heartburn. 355 mL 0   apixaban   (ELIQUIS ) 5 MG TABS tablet Take 1 tablet (5 mg total) by mouth 2 (two) times daily. 60 tablet 11   atorvastatin  (LIPITOR ) 80 MG tablet Take 1 tablet (80 mg total) by mouth daily. 90 tablet 3   Blood Glucose Monitoring Suppl (ACCU-CHEK GUIDE) w/Device KIT Check blood sugar once daily before breakfast 1 kit 0   buPROPion  (WELLBUTRIN  XL) 150 MG 24 hr tablet Take 150 mg by mouth daily.     cetirizine  (ZYRTEC  ALLERGY ) 10 MG tablet Take 1 tablet (10 mg total) by mouth 2 (two) times daily. 60 tablet 5   cyclobenzaprine  (FLEXERIL ) 10 MG tablet Take 10 mg by mouth 3 (three) times daily as needed for muscle spasms.     dapagliflozin  propanediol (FARXIGA ) 10 MG TABS tablet Take 1 tablet (10 mg total) by mouth daily before breakfast. 90 tablet 3   diphenoxylate -atropine  (LOMOTIL ) 2.5-0.025 MG tablet Take 2 tablets by mouth 4 (four) times daily as needed for diarrhea/ loose stools. 60 tablet 3   Eszopiclone  3 MG TABS Take 1 tablet (3 mg total) by mouth at bedtime. Take immediately before bedtime 30 tablet 0   famotidine  (PEPCID ) 20 MG tablet Take 1 tablet (20 mg total) by mouth 2 (two) times daily. 60 tablet 5   feeding supplement (ENSURE ENLIVE / ENSURE PLUS) LIQD Take  237 mLs by mouth 2 (two) times daily between meals. 237 mL 12   Fezolinetant  (VEOZAH ) 45 MG TABS Take 1 tablet (45 mg total) by mouth daily. 30 tablet 2   fluticasone  (FLONASE ) 50 MCG/ACT nasal spray Place 1-2 sprays into both nostrils daily as needed (nasal congestion). 16 g 5   furosemide  (LASIX ) 20 MG tablet Take 2 tablets (40 mg total) by mouth 2 (two) times daily. 120 tablet 3   glucose blood test strip Use as instructed to check blood sugar daily before breakfast 100 each 6   ivabradine  (CORLANOR ) 5 MG TABS tablet Take 1/2 tablet (2.5 mg total) by mouth 2 (two) times daily. 30 tablet 5   lipase/protease/amylase (CREON ) 36000 UNITS CPEP capsule Take 1 capsule (36,000 Units total) by mouth in the morning, at noon, and at bedtime. 270 capsule 1    metroNIDAZOLE  (METROGEL ) 1 % gel Apply 1 Application topically daily as needed (rash).     Multiple Vitamins-Minerals (MULTIVITAMIN WITH MINERALS) tablet Take 1 tablet by mouth daily.     oxymetazoline (AFRIN) 0.05 % nasal spray Place 1-2 sprays into both nostrils 2 (two) times daily as needed for congestion.     potassium chloride  SA (KLOR-CON  M) 20 MEQ tablet Take 4 tablets (80 mEq total) by mouth 2 (two) times daily. (Patient taking differently: Take 60 mEq by mouth 2 (two) times daily.) 240 tablet 3   promethazine  (PHENERGAN ) 25 MG tablet Take 1 tablet (25 mg total) by mouth every 6 (six) to 8 (eight) hours for nausea or vomiting. 90 tablet 3   sitaGLIPtin  (JANUVIA ) 50 MG tablet Take 1 tablet (50 mg total) by mouth daily. 90 tablet 1   spironolactone  (ALDACTONE ) 25 MG tablet Take 0.5 tablets (12.5 mg total) by mouth at bedtime. 15 tablet 3   triamcinolone  cream (KENALOG ) 0.1 % Apply 1 Application topically daily as needed (irritation).     valACYclovir  (VALTREX ) 500 MG tablet Take 1 tablet (500 mg total) by mouth 2 (two) times daily. (Patient taking differently: Take 500 mg by mouth 2 (two) times daily as needed (outbreaks).) 6 tablet 2   vedolizumab  (ENTYVIO ) 300 MG injection Inject 300 mg into the vein every 2 (two) months.     Vitamin D , Ergocalciferol , (DRISDOL ) 1.25 MG (50000 UNIT) CAPS capsule Take 1 capsule (50,000 Units total) by mouth every 7 (seven) days. 8 capsule 0   dicyclomine (BENTYL) 10 MG capsule Take 1 capsule (10 mg total) by mouth every 6 (six) hours as needed for spasms. (Patient not taking: Reported on 09/18/2023) 60 capsule 3   predniSONE  (DELTASONE ) 20 MG tablet Take 1 tablet by mouth daily x 3 days then 1/2 tablet daily x 4 days (Patient not taking: Reported on 09/18/2023) 5 tablet 0   No current facility-administered medications for this visit.    Physical Exam:     BP 108/70   Pulse 72   Ht 5\' 10"  (1.778 m)   Wt 184 lb 4 oz (83.6 kg)   BMI 26.44 kg/m    GENERAL:  Pleasant female in NAD PSYCH: : Cooperative, normal affect Musculoskeletal:  Normal muscle tone, normal strength NEURO: Alert and oriented x 3, no focal neurologic deficits   IMPRESSION and PLAN:    1) Crohn's disease 58 year old female with stricturing ileal Crohn's disease with good clinical response to Entyvio .  Thankfully, all parameters seem to indicate her Crohn's is still in remission with her negative/normal CRP, calprotectin, and most recent CTE findings.  Vedolizumab  trough essentially at  goal and no antibodies detected.  Given advanced heart failure and other comorbidities, she is not a good candidate for endoscopic evaluation and instead we have been relying on noninvasive markers to monitor her Crohn's disease. - Continue Entyvio  as scheduled - Continue periodic ESR, CRP, fecal calprotectin check for surrogate marker of inflammation along with periodic vedolizumab  trough/antibody level for proactive drug monitoring.   - If requiring colonoscopy, will certainly need to be done at [facility]. Per her previous conversation with Dr. Bensimhon, that may even require admission to the Heart Failure unit for perioperative observation - Check thiamine, folate, iron panel  2) Nausea 3) Decreased appetite 4) Change in bowel habits 5) Vitamin D  deficiency 6) Abdominal cramping 7) Fatigue - Check for additional micronutrient deficiencies as above - Trial low FODMAP diet.  Provided with handout and detailed instructions today - Try low-dose fiber supplement - If ongoing symptoms, recommended trialing half tablet of Bentyl on demand - Per patient request, placed referral to Endocrinology  8) Diabetes - Continue current management per PCP and can get additional input from Endocrinologist as above  9) CHF 10) CAD - Continue close follow-up in the Heart Failure clinic - No anti-TNF therapy  11) EPI As previously discussed, very unclear if she truly has EPI as  the fecal elastase was very low.  Nonetheless, has had a good response to Creon  - Continue Creon  for the time being   RTC in 6 months or sooner as needed  I spent 40 minutes of time, including in depth chart review, independent review of results as outlined above, communicating results with the patient directly, face-to-face time with the patient, coordinating care, ordering studies and medications as appropriate, and documentation.            Annis Kinder ,DO, FACG 09/18/2023, 3:55 PM

## 2023-09-19 ENCOUNTER — Ambulatory Visit: Payer: Self-pay | Admitting: Gastroenterology

## 2023-09-19 LAB — FOLATE: Folate: 9.9 ng/mL (ref >5.9)

## 2023-09-23 LAB — VITAMIN B1: Vitamin B1 (Thiamine): 12 nmol/L (ref 8–30)

## 2023-09-26 DIAGNOSIS — Z419 Encounter for procedure for purposes other than remedying health state, unspecified: Secondary | ICD-10-CM | POA: Diagnosis not present

## 2023-09-27 ENCOUNTER — Other Ambulatory Visit (HOSPITAL_COMMUNITY): Payer: Self-pay

## 2023-09-29 ENCOUNTER — Other Ambulatory Visit (HOSPITAL_COMMUNITY): Payer: Self-pay

## 2023-09-30 ENCOUNTER — Ambulatory Visit: Admitting: Internal Medicine

## 2023-09-30 ENCOUNTER — Ambulatory Visit: Admitting: Primary Care

## 2023-09-30 DIAGNOSIS — R0602 Shortness of breath: Secondary | ICD-10-CM | POA: Diagnosis not present

## 2023-09-30 LAB — PULMONARY FUNCTION TEST
DL/VA % pred: 120 %
DL/VA: 4.9 ml/min/mmHg/L
DLCO cor % pred: 105 %
DLCO cor: 26.42 ml/min/mmHg
DLCO unc % pred: 105 %
DLCO unc: 26.42 ml/min/mmHg
FEF 25-75 Post: 3.89 L/s
FEF 25-75 Pre: 2.89 L/s
FEF2575-%Change-Post: 34 %
FEF2575-%Pred-Post: 133 %
FEF2575-%Pred-Pre: 98 %
FEV1-%Change-Post: 9 %
FEV1-%Pred-Post: 98 %
FEV1-%Pred-Pre: 90 %
FEV1-Post: 3.22 L
FEV1-Pre: 2.95 L
FEV1FVC-%Change-Post: 2 %
FEV1FVC-%Pred-Pre: 99 %
FEV6-%Change-Post: 7 %
FEV6-%Pred-Post: 97 %
FEV6-%Pred-Pre: 91 %
FEV6-Post: 3.99 L
FEV6-Pre: 3.72 L
FEV6FVC-%Change-Post: 0 %
FEV6FVC-%Pred-Post: 103 %
FEV6FVC-%Pred-Pre: 102 %
FVC-%Change-Post: 6 %
FVC-%Pred-Post: 94 %
FVC-%Pred-Pre: 88 %
FVC-Post: 3.99 L
FVC-Pre: 3.73 L
Post FEV1/FVC ratio: 81 %
Post FEV6/FVC ratio: 100 %
Pre FEV1/FVC ratio: 79 %
Pre FEV6/FVC Ratio: 100 %
RV % pred: 100 %
RV: 2.19 L
TLC % pred: 101 %
TLC: 6.02 L

## 2023-09-30 NOTE — Patient Instructions (Signed)
 Full PFT performed today.

## 2023-09-30 NOTE — Progress Notes (Signed)
 Full PFT performed today.

## 2023-10-02 ENCOUNTER — Ambulatory Visit: Payer: Self-pay | Admitting: Primary Care

## 2023-10-06 ENCOUNTER — Other Ambulatory Visit: Payer: Self-pay | Admitting: Family Medicine

## 2023-10-06 DIAGNOSIS — E559 Vitamin D deficiency, unspecified: Secondary | ICD-10-CM

## 2023-10-07 ENCOUNTER — Telehealth: Payer: Self-pay | Admitting: Internal Medicine

## 2023-10-07 ENCOUNTER — Other Ambulatory Visit (HOSPITAL_COMMUNITY): Payer: Self-pay

## 2023-10-07 ENCOUNTER — Other Ambulatory Visit: Payer: Self-pay

## 2023-10-07 MED ORDER — ESZOPICLONE 3 MG PO TABS
3.0000 mg | ORAL_TABLET | Freq: Every day | ORAL | 5 refills | Status: DC
  Filled 2023-10-07: qty 15, 15d supply, fill #0

## 2023-10-07 MED FILL — Ivabradine HCl Tab 5 MG (Base Equiv): ORAL | 30 days supply | Qty: 30 | Fill #2 | Status: AC

## 2023-10-07 NOTE — Telephone Encounter (Signed)
 Copied from CRM [id number]. Topic: Clinical - Prescription Issue >> Oct 07, 2023  9:13 AM Corean SAUNDERS wrote: Reason for CRM: Patient is requesting Christine [name] to please start ordering her a 30 day prescription of the Eszopiclone  3 MG TABS as it is currently a 15 day prescription and patient states she struggles to make 2 co payments for this each month.   Please send this to [facility] [address] [place] [address] Phone: [phone] Fax: [phone]  Please advise on eszopiclone  refill for patient

## 2023-10-07 NOTE — Telephone Encounter (Unsigned)
 Copied from CRM [id number]. Topic: Clinical - Medication Refill >> Oct 07, 2023  9:14 AM Myrick T wrote: Medication: Vitamin D , Ergocalciferol , (DRISDOL ) 1.25 MG (50000 UNIT) CAPS capsule  Has the patient contacted their pharmacy? No  This is the patient's preferred pharmacy:  [facility] [address] [place] [address] Phone: [phone] Fax: [phone]  Is this the correct pharmacy for this prescription? Yes  Has the prescription been filled recently? Yes  Is the patient out of the medication? Yes  Has the patient been seen for an appointment in the last year OR does the patient have an upcoming appointment? Yes  Can we respond through MyChart? Yes  Agent: Please be advised that Rx refills may take up to 3 business days. We ask that you follow-up with your pharmacy.

## 2023-10-07 NOTE — Telephone Encounter (Signed)
 Sent in RX - 30 tablets

## 2023-10-09 ENCOUNTER — Telehealth: Payer: Self-pay | Admitting: Pharmacy Technician

## 2023-10-09 NOTE — Telephone Encounter (Signed)
 Auth Submission: NO AUTH NEEDED Site of care: Site of care: [facility] Payer: [facility] MEDICAID Medication & CPT/J Code(s) submitted: Entyvio  (Vedolizumab ) J3380 Diagnosis Code:  Route of submission (phone, fax, portal): phone Phone # Fax # Auth type: Buy/Bill HB Units/visits requested: 300mg  q 2 months Reference number: [id number] Approval from: 10/09/23 to 04/15/24   Dagoberto Armour, CPhT

## 2023-10-10 ENCOUNTER — Ambulatory Visit (HOSPITAL_COMMUNITY)
Admission: RE | Admit: 2023-10-10 | Discharge: 2023-10-10 | Disposition: A | Discharge status: Home / Self Care | Source: Ambulatory Visit | Attending: Gastroenterology | Admitting: Gastroenterology

## 2023-10-10 DIAGNOSIS — K5000 Crohn's disease of small intestine without complications: Secondary | ICD-10-CM | POA: Diagnosis not present

## 2023-10-10 MED ORDER — VEDOLIZUMAB 300 MG IV SOLR
300.0000 mg | INTRAVENOUS | Status: DC
  Administered 2023-10-10: 300 mg via INTRAVENOUS
  Filled 2023-10-10: qty 5

## 2023-10-11 ENCOUNTER — Telehealth: Payer: Self-pay

## 2023-10-11 DIAGNOSIS — E559 Vitamin D deficiency, unspecified: Secondary | ICD-10-CM

## 2023-10-11 MED ORDER — VITAMIN D (ERGOCALCIFEROL) 1.25 MG (50000 UNIT) PO CAPS
50000.0000 [IU] | ORAL_CAPSULE | ORAL | 1 refills | Status: DC
  Filled 2023-10-11: qty 12, 84d supply, fill #0
  Filled 2023-12-10 – 2023-12-13 (×3): qty 12, 84d supply, fill #1

## 2023-10-11 NOTE — Addendum Note (Signed)
 Addended by: VICCI SOBER B on: 10/11/2023 08:27 PM   Modules accepted: Orders

## 2023-10-11 NOTE — Telephone Encounter (Signed)
 Copied from CRM [id number]. Topic: Clinical - Medication Question >> Oct 11, 2023 11:45 AM Antwanette L wrote: Reason for CRM: Patient wants to know if Dr. Vicci can refill Vitamin D , Ergocalciferol , (DRISDOL ) 1.25 MG (50000 UNIT) CAPS capsule? The order was written by Corean Ku FNP. The patient is experiencing chills and hot flashes. Please contact the patient at [phone].  Preferred Pharmacy [place] [facility] [address] [place] [address] Phone: [phone] Fax: [phone] Hours: Mon-Fri 7:30am-6pm; Sat 8:00am-4:30pm

## 2023-10-12 ENCOUNTER — Other Ambulatory Visit (HOSPITAL_COMMUNITY): Payer: Self-pay

## 2023-10-14 ENCOUNTER — Other Ambulatory Visit: Payer: Self-pay

## 2023-10-15 ENCOUNTER — Other Ambulatory Visit (HOSPITAL_COMMUNITY): Payer: Self-pay

## 2023-10-21 DIAGNOSIS — G4733 Obstructive sleep apnea (adult) (pediatric): Secondary | ICD-10-CM | POA: Diagnosis not present

## 2023-10-22 ENCOUNTER — Other Ambulatory Visit: Payer: Self-pay

## 2023-10-22 ENCOUNTER — Ambulatory Visit: Payer: 59

## 2023-10-22 ENCOUNTER — Other Ambulatory Visit: Payer: Self-pay | Admitting: Allergy

## 2023-10-22 DIAGNOSIS — I255 Ischemic cardiomyopathy: Secondary | ICD-10-CM | POA: Diagnosis not present

## 2023-10-23 ENCOUNTER — Other Ambulatory Visit (HOSPITAL_COMMUNITY): Payer: Self-pay

## 2023-10-23 ENCOUNTER — Telehealth: Payer: Self-pay | Admitting: Internal Medicine

## 2023-10-23 MED ORDER — FAMOTIDINE 20 MG PO TABS
20.0000 mg | ORAL_TABLET | Freq: Two times a day (BID) | ORAL | 5 refills | Status: DC
  Filled 2023-10-23: qty 60, 30d supply, fill #0
  Filled 2023-12-01: qty 60, 30d supply, fill #1

## 2023-10-23 MED ORDER — CETIRIZINE HCL 10 MG PO TABS
10.0000 mg | ORAL_TABLET | Freq: Two times a day (BID) | ORAL | 5 refills | Status: DC
  Filled 2023-10-23: qty 60, 30d supply, fill #0
  Filled 2023-12-01: qty 60, 30d supply, fill #1

## 2023-10-23 NOTE — Telephone Encounter (Signed)
 Called pt to confirm appt. Pt will be present.

## 2023-10-24 ENCOUNTER — Encounter: Payer: Self-pay | Admitting: Internal Medicine

## 2023-10-24 ENCOUNTER — Other Ambulatory Visit (HOSPITAL_COMMUNITY): Payer: Self-pay

## 2023-10-24 ENCOUNTER — Ambulatory Visit: Attending: Internal Medicine | Admitting: Internal Medicine

## 2023-10-24 VITALS — BP 92/65 | HR 81 | Temp 98.2°F | Ht 70.0 in | Wt 183.0 lb

## 2023-10-24 DIAGNOSIS — F320 Major depressive disorder, single episode, mild: Secondary | ICD-10-CM | POA: Diagnosis not present

## 2023-10-24 DIAGNOSIS — E119 Type 2 diabetes mellitus without complications: Secondary | ICD-10-CM | POA: Diagnosis not present

## 2023-10-24 DIAGNOSIS — Z1231 Encounter for screening mammogram for malignant neoplasm of breast: Secondary | ICD-10-CM

## 2023-10-24 DIAGNOSIS — M4184 Other forms of scoliosis, thoracic region: Secondary | ICD-10-CM

## 2023-10-24 DIAGNOSIS — I2510 Atherosclerotic heart disease of native coronary artery without angina pectoris: Secondary | ICD-10-CM

## 2023-10-24 DIAGNOSIS — E663 Overweight: Secondary | ICD-10-CM

## 2023-10-24 DIAGNOSIS — E785 Hyperlipidemia, unspecified: Secondary | ICD-10-CM | POA: Diagnosis not present

## 2023-10-24 DIAGNOSIS — Z7984 Long term (current) use of oral hypoglycemic drugs: Secondary | ICD-10-CM

## 2023-10-24 DIAGNOSIS — E1169 Type 2 diabetes mellitus with other specified complication: Secondary | ICD-10-CM

## 2023-10-24 DIAGNOSIS — Z6826 Body mass index (BMI) 26.0-26.9, adult: Secondary | ICD-10-CM

## 2023-10-24 DIAGNOSIS — M419 Scoliosis, unspecified: Secondary | ICD-10-CM

## 2023-10-24 LAB — CUP PACEART REMOTE DEVICE CHECK
Battery Remaining Longevity: 156 mo
Battery Remaining Percentage: 100 %
Brady Statistic RV Percent Paced: 0 %
Date Time Interrogation Session: 20250708065800
HighPow Impedance: 87 Ohm
Implantable Lead Connection Status: 753985
Implantable Lead Implant Date: 20230407
Implantable Lead Location: 753860
Implantable Lead Model: 138
Implantable Lead Serial Number: 303166
Implantable Pulse Generator Implant Date: 20230407
Lead Channel Impedance Value: 858 Ohm
Lead Channel Pacing Threshold Amplitude: 1 V
Lead Channel Pacing Threshold Pulse Width: 0.4 ms
Lead Channel Setting Pacing Amplitude: 2.5 V
Lead Channel Setting Pacing Pulse Width: 0.4 ms
Lead Channel Setting Sensing Sensitivity: 0.5 mV
Pulse Gen Serial Number: 216478
Zone Setting Status: 755011

## 2023-10-24 NOTE — Progress Notes (Signed)
 Patient ID: Christine [name], female    DOB: [birth year]  MRN: [id number]  CC: Follow-up (Follow-up.  /Discuss cholesterol level & mild degenerative changes of spine, scoliosis/Requesting OBGYN referral -/Difficulty with weight loss - recently started exercise /Yes to mammogram )   Subjective: Christine [name] is a 58 y.o. female who presents for chronic ds management. 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   Discussed the use of AI scribe software for clinical note transcription with the patient, who gave verbal consent to proceed.  History of Present Illness Christine [name] is a 58 year old female with heart disease and congestive heart failure who presents to discuss several issues.    HL/CAD: She is on atorvastatin  80 mg for cholesterol management.  Level has not been checked in a year.  Currently on atorvastatin  80 mg daily.  She reports that back in 2023 cardiology had wanted to add Repatha but it was denied by Medicaid.  They were working on trying to appeal but she did not hear anything further and this was placed on the back burner as she dealt with other health issues.  This came to her mind recently and wanted to explore this as to whether it is still necessary.  She has upcoming appointment with cardiology later this month.  Reports compliance with her heart medications.    DM: She started walking 1 month ago and join the gym this week., walking 12 miles a week. Despite dietary changes and exercise, she is having difficulty losing weight. Her blood sugar levels are well-controlled, usually between 120-125 even after meals.  She is on Januvia  for diabetes.  She is concerned  about the impact of diabetes on her weight loss efforts.  She has a defibrillator and inquired about the safety of using massage chairs at the gym.   She had a hysterectomy at the age of 65 due to recurrent ovarian cysts and what sounds like dysfunctional bleeding.  No cancerous reason.  Ovaries were left in place.  She wonders whether she needs to have Pap smear.  She was looking through some of the imaging studies that she had done over the years.  She noted that a CT that was done in May of this year noted some degenerative changes of the spine.  On x-rays that she had done in 2022 and 2023, mention was made of upper thoracic scoliosis.  She wonders whether she needs to be concerned.  She denies any chronic back pain issues.  Back occasionally hurts.  Depression screen is positive today.  She is still plugged in with behavioral health and is followed regularly.  She feels she scored higher today because she is mourning the loss of her recent relative.  Otherwise she feels stable on her meds.    Patient Active Problem List   Diagnosis Date Noted   Hypotension 08/25/2023   Cardiogenic shock (HCC) 08/24/2023   Abnormal laboratory test result 06/26/2023   Crohn's disease involving terminal ileum (HCC) 07/23/2022   Crohn's disease of large intestine with other complication (HCC) 05/11/2022   Mild major depression (HCC) 05/11/2022   Paroxysmal tachycardia, unspecified (HCC) 05/11/2022   Acute pancreatitis 03/01/2022   Hypokalemia 03/01/2022  Ileitis 03/01/2022   Diarrhea 02/26/2022   Nausea without vomiting 02/26/2022   Loss of weight 02/26/2022   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   Ulcer of ileum 02/26/2022   Diverticulosis of colon without hemorrhage 02/26/2022   Cardiomyopathy (HCC) ischemic and non-ischemic 01/16/2022   Abdominal aortic aneurysm (AAA) 3.0 cm to 5.0 cm in diameter in female [facility]) 01/09/2022   Polycythemia 01/09/2022   Chronic combined systolic and diastolic heart  failure (HCC) 94/88/7976   CAD in native artery 08/24/2021   S/P ICD (internal cardiac defibrillator) procedure 08/10/2021   Obstructive sleep apnea 05/05/2021   Former smoker 02/06/2021   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   Obesity (BMI 30.0-34.9) 02/06/2021   Anxiety 01/20/2021   Tobacco abuse 01/20/2021   Shortness of breath 01/20/2021   Chest tightness      Current Outpatient Medications on File Prior to Visit  Medication Sig Dispense Refill   Accu-Chek Softclix Lancets lancets Use as instructed.  Check blood sugar daily before breakfast. 100 each 12   acetaminophen  (TYLENOL ) 500 MG tablet Take 1,000 mg by mouth 3 (three) times daily as needed for moderate pain or headache.     ALPRAZolam  (XANAX ) 1 MG tablet Take 2 mg by mouth 3 (three) times daily.     alum & mag hydroxide-simeth (MAALOX/MYLANTA) 200-200-20 MG/5ML suspension Take 30 mLs by mouth every 4 (four) hours as needed for indigestion or heartburn. 355 mL 0   apixaban  (ELIQUIS ) 5 MG TABS tablet Take 1 tablet (5 mg total) by mouth 2 (two) times daily. 60 tablet 11   atorvastatin  (LIPITOR ) 80 MG tablet Take 1 tablet (80 mg total) by mouth daily. 90 tablet 3   Blood Glucose Monitoring Suppl (ACCU-CHEK GUIDE) w/Device KIT Check blood sugar once daily before breakfast 1 kit 0   buPROPion  (WELLBUTRIN  XL) 150 MG 24 hr tablet Take 150 mg by mouth daily.     cetirizine  (ZYRTEC  ALLERGY ) 10 MG tablet Take 1 tablet (10 mg total) by mouth 2 (two) times daily. 60 tablet 5   cyclobenzaprine  (FLEXERIL ) 10 MG tablet Take 10 mg by mouth 3 (three) times daily as needed for muscle spasms.     dapagliflozin  propanediol (FARXIGA ) 10 MG TABS tablet Take 1 tablet (10 mg total) by mouth daily before breakfast. 90 tablet 3   dicyclomine  (BENTYL ) 10 MG capsule Take 1 capsule (10 mg total) by mouth every 6 (six) hours as needed for spasms. 60 capsule 3   diphenoxylate -atropine  (LOMOTIL ) 2.5-0.025 MG tablet Take 2 tablets by mouth 4 (four) times daily  as needed for diarrhea/ loose stools. 60 tablet 3   Eszopiclone  3 MG TABS Take 1 tablet (3 mg total) by mouth at bedtime. Take immediately before bedtime 30 tablet 5   famotidine  (PEPCID ) 20 MG tablet Take 1 tablet (20 mg total) by mouth 2 (two) times daily. 60 tablet 5   feeding supplement (ENSURE ENLIVE / ENSURE PLUS) LIQD Take 237 mLs by mouth 2 (two) times daily between meals. 237 mL 12   Fezolinetant  (VEOZAH ) 45 MG TABS Take 1 tablet (45 mg total) by mouth daily. 30 tablet 2   fluticasone  (FLONASE ) 50 MCG/ACT nasal spray Place 1-2 sprays into both nostrils daily as needed (nasal congestion). 16 g 5   furosemide  (LASIX ) 20 MG tablet Take 2 tablets (40 mg total) by mouth 2 (two) times daily. 120 tablet 3   glucose blood test strip Use as instructed to check blood sugar daily before breakfast 100 each 6  ivabradine  (CORLANOR ) 5 MG TABS tablet Take 1/2 tablet (2.5 mg total) by mouth 2 (two) times daily. 30 tablet 5   lipase/protease/amylase (CREON ) 36000 UNITS CPEP capsule Take 1 capsule (36,000 Units total) by mouth in the morning, at noon, and at bedtime. 270 capsule 1   metroNIDAZOLE  (METROGEL ) 1 % gel Apply 1 Application topically daily as needed (rash).     Multiple Vitamins-Minerals (MULTIVITAMIN WITH MINERALS) tablet Take 1 tablet by mouth daily.     oxymetazoline (AFRIN) 0.05 % nasal spray Place 1-2 sprays into both nostrils 2 (two) times daily as needed for congestion.     potassium chloride  SA (KLOR-CON  M) 20 MEQ tablet Take 4 tablets (80 mEq total) by mouth 2 (two) times daily. (Patient taking differently: Take 60 mEq by mouth 3 (three) times daily.) 240 tablet 3   predniSONE  (DELTASONE ) 20 MG tablet Take 1 tablet by mouth daily x 3 days then 1/2 tablet daily x 4 days 5 tablet 0   promethazine  (PHENERGAN ) 25 MG tablet Take 1 tablet (25 mg total) by mouth every 6 (six) to 8 (eight) hours for nausea or vomiting. 90 tablet 3   sitaGLIPtin  (JANUVIA ) 50 MG tablet Take 1 tablet (50 mg total)  by mouth daily. 90 tablet 1   spironolactone  (ALDACTONE ) 25 MG tablet Take 0.5 tablets (12.5 mg total) by mouth at bedtime. 15 tablet 3   triamcinolone  cream (KENALOG ) 0.1 % Apply 1 Application topically daily as needed (irritation).     valACYclovir  (VALTREX ) 500 MG tablet Take 1 tablet (500 mg total) by mouth 2 (two) times daily. (Patient taking differently: Take 500 mg by mouth 2 (two) times daily as needed (outbreaks).) 6 tablet 2   vedolizumab  (ENTYVIO ) 300 MG injection Inject 300 mg into the vein every 2 (two) months.     Vitamin D , Ergocalciferol , (DRISDOL ) 1.25 MG (50000 UNIT) CAPS capsule Take 1 capsule (50,000 Units total) by mouth every 7 (seven) days. 12 capsule 1   No current facility-administered medications on file prior to visit.    Allergies  Allergen Reactions   Gabapentin Diarrhea   Sulfa Antibiotics Hives, Itching and Rash   Erythromycin Hives   Tramadol  Rash and Other (See Comments)    Urinary retention     Ibuprofen Itching   Tape Rash    Social History   Socioeconomic History   Marital status: Single    Spouse name: Not on file   Number of children: 0   Years of education: 16   Highest education level: Bachelor's degree (e.g., BA, AB, BS)  Occupational History   Not on file  Tobacco Use   Smoking status: Former    Current packs/day: 0.00    Types: Cigarettes    Quit date: 01/15/2021    Years since quitting: 2.7    Passive exposure: Past   Smokeless tobacco: Never  Vaping Use   Vaping status: Never Used  Substance and Sexual Activity   Alcohol use: Not Currently   Drug use: Never   Sexual activity: Not on file  Other Topics Concern   Not on file  Social History Narrative   Not on file   Social Drivers of Health   Financial Resource Strain: Medium Risk (10/20/2023)   Overall Financial Resource Strain (CARDIA)    Difficulty of Paying Living Expenses: Somewhat hard  Food Insecurity: No Food Insecurity (10/20/2023)   Hunger Vital Sign    Worried  About Running Out of Food in the Last Year: Never true  Ran Out of Food in the Last Year: Never true  Transportation Needs: No Transportation Needs (10/20/2023)   PRAPARE - Administrator, Civil Service (Medical): No    Lack of Transportation (Non-Medical): No  Physical Activity: Sufficiently Active (10/20/2023)   Exercise Vital Sign    Days of Exercise per Week: 5 days    Minutes of Exercise per Session: 30 min  Recent Concern: Physical Activity - Insufficiently Active (09/03/2023)   Exercise Vital Sign    Days of Exercise per Week: 5 days    Minutes of Exercise per Session: 20 min  Stress: Stress Concern Present (10/20/2023)   [employer] of Occupational Health - Occupational Stress Questionnaire    Feeling of Stress: Rather much  Social Connections: Socially Isolated (10/20/2023)   Social Connection and Isolation Panel    Frequency of Communication with Friends and Family: Three times a week    Frequency of Social Gatherings with Friends and Family: Once a week    Attends Religious Services: Never    Database administrator or Organizations: No    Attends Engineer, structural: Not on file    Marital Status: Never married  Intimate Partner Violence: Not At Risk (09/03/2023)   Humiliation, Afraid, Rape, and Kick questionnaire    Fear of Current or Ex-Partner: No    Emotionally Abused: No    Physically Abused: No    Sexually Abused: No    Family History  Problem Relation Age of Onset   Eczema Mother    Stroke Mother    Atrial fibrillation Mother    Heart failure Father    Asthma Brother    Stomach cancer Maternal Grandfather    Liver cancer Neg Hx    Esophageal cancer Neg Hx    Colon polyps Neg Hx    Colon cancer Neg Hx    Allergic rhinitis Neg Hx    Angioedema Neg Hx    Urticaria Neg Hx     Past Surgical History:  Procedure Laterality Date   ABDOMINAL HYSTERECTOMY     BIOPSY  02/26/2022   Procedure: BIOPSY;  Surgeon: San Sandor GAILS, DO;   Location: [facility];  Service: Gastroenterology;;   CARDIAC CATHETERIZATION     CHOLECYSTECTOMY     COLONOSCOPY WITH PROPOFOL  N/A 02/26/2022   Procedure: COLONOSCOPY WITH PROPOFOL ;  Surgeon: San Sandor GAILS, DO;  Location: [facility];  Service: Gastroenterology;  Laterality: N/A;   ESOPHAGOGASTRODUODENOSCOPY (EGD) WITH PROPOFOL  N/A 02/26/2022   Procedure: ESOPHAGOGASTRODUODENOSCOPY (EGD) WITH PROPOFOL ;  Surgeon: San Sandor GAILS, DO;  Location: [facility];  Service: Gastroenterology;  Laterality: N/A;   ICD IMPLANT N/A 07/21/2021   Procedure: ICD IMPLANT;  Surgeon: Fernande Elspeth BROCKS, MD;  Location: [facility] INVASIVE CV LAB;  Service: Cardiovascular;  Laterality: N/A;   NECK SURGERY     OVARIAN CYST SURGERY     POLYPECTOMY  02/26/2022   Procedure: POLYPECTOMY;  Surgeon: San Sandor GAILS, DO;  Location: [facility];  Service: Gastroenterology;;   RIGHT HEART CATH N/A 07/12/2023   Procedure: RIGHT HEART CATH;  Surgeon: Cherrie Toribio SAUNDERS, MD;  Location: [facility] INVASIVE CV LAB;  Service: Cardiovascular;  Laterality: N/A;   RIGHT/LEFT HEART CATH AND CORONARY ANGIOGRAPHY N/A 01/23/2021   Procedure: RIGHT/LEFT HEART CATH AND CORONARY ANGIOGRAPHY;  Surgeon: Cherrie Toribio SAUNDERS, MD;  Location: [facility] INVASIVE CV LAB;  Service: Cardiovascular;  Laterality: N/A;   TONSILLECTOMY      ROS: Review of Systems Negative except as stated above  PHYSICAL  EXAM: BP 92/65 (BP Location: Left Arm, Patient Position: Sitting, Cuff Size: Normal)   Pulse 81   Temp 98.2 F (36.8 C) (Oral)   Ht 5' 10 (1.778 m)   Wt 183 lb (83 kg)   SpO2 97%   BMI 26.26 kg/m   Wt Readings from Last 3 Encounters:  10/24/23 183 lb (83 kg)  10/10/23 185 lb (83.9 kg)  09/18/23 184 lb 4 oz (83.6 kg)    Physical Exam  General appearance - alert, well appearing, and in no distress Mental status - normal mood, behavior, speech, dress, motor activity, and thought processes Chest - clear to auscultation, no wheezes, rales or  rhonchi, symmetric air entry Heart - normal rate, regular rhythm, normal S1, S2, no murmurs, rubs, clicks or gallops Extremities - peripheral pulses normal, no pedal edema, no clubbing or cyanosis MSK: Slight curvature felt on the upper thoracic spine when trunk is flexed forward.     10/24/2023    3:17 PM 09/03/2023    2:16 PM 01/15/2023    1:47 PM  Depression screen PHQ 2/9  Decreased Interest 1 3 1   Down, Depressed, Hopeless 1 2 1   PHQ - 2 Score 2 5 2   Altered sleeping 3 2 1   Tired, decreased energy 3 3 3   Change in appetite 2 2 1   Feeling bad or failure about yourself  2 2 0  Trouble concentrating 3 3 2   Moving slowly or fidgety/restless 2 3 0  Suicidal thoughts 0 0 0  PHQ-9 Score 17 20 9   Difficult doing work/chores Somewhat difficult Somewhat difficult Somewhat difficult       Latest Ref Rng & Units 09/03/2023    3:21 PM 08/26/2023    5:07 AM 08/25/2023   12:56 PM  CMP  Glucose 70 - 99 mg/dL 98  875  83   BUN 6 - 24 mg/dL 14  15  17    Creatinine 0.57 - 1.00 mg/dL 8.96  8.91  8.67   Sodium 134 - 144 mmol/L 140  136  135   Potassium 3.5 - 5.2 mmol/L 4.1  3.1  4.9   Chloride 96 - 106 mmol/L 105  106  105   CO2 20 - 29 mmol/L 15  23  25    Calcium  8.7 - 10.2 mg/dL 9.8  8.3  9.1   Total Protein 6.5 - 8.1 g/dL   6.4   Total Bilirubin 0.0 - 1.2 mg/dL   0.6   Alkaline Phos 38 - 126 U/L   90   AST 15 - 41 U/L   19   ALT 0 - 44 U/L   20    Lipid Panel     Component Value Date/Time   CHOL 185 03/01/2022 0339   TRIG 221 (H) 03/01/2022 0339   HDL 36 (L) 03/01/2022 0339   CHOLHDL 5.1 03/01/2022 0339   VLDL 44 (H) 03/01/2022 0339   LDLCALC 105 (H) 03/01/2022 0339    CBC    Component Value Date/Time   WBC 6.5 08/25/2023 1256   RBC 4.58 08/25/2023 1256   HGB 13.6 08/25/2023 1256   HGB 16.0 (H) 04/22/2023 1356   HCT 40.9 08/25/2023 1256   HCT 47.9 (H) 04/22/2023 1356   PLT 309 08/25/2023 1256   PLT 361 04/22/2023 1356   MCV 89.3 08/25/2023 1256   MCV 84 04/22/2023  1356   MCH 29.7 08/25/2023 1256   MCHC 33.3 08/25/2023 1256   RDW 13.7 08/25/2023 1256   RDW 12.5  04/22/2023 1356   LYMPHSABS 1.8 08/24/2023 1454   LYMPHSABS 1.6 04/22/2023 1356   MONOABS 0.9 08/24/2023 1454   EOSABS 0.3 08/24/2023 1454   EOSABS 0.5 (H) 04/22/2023 1356   BASOSABS 0.1 08/24/2023 1454   BASOSABS 0.1 04/22/2023 1356    ASSESSMENT AND PLAN: 1. Scoliosis of thoracic spine, unspecified scoliosis type (Primary) Advised patient that imaging studies do show scoliosis of the upper spine.  Seems mild on exam.  She is not really having any symptoms from it so I think we can observe at this time.  No need for physical therapy or bracing.  Patient would like to have it checked out nonetheless so we we will refer her to orthopedics. - AMB referral to orthopedics  2. Coronary artery disease involving native coronary artery of native heart without angina pectoris Clinically stable.  Continue atorvastatin   3. Hyperlipidemia associated with type 2 diabetes mellitus (HCC) We will recheck lipid profile today.  If LDL is not below 55, she will discuss with cardiology on her upcoming visit whether Repatha still needs to be pursued. - Lipid panel  4. Diabetes mellitus treated with oral medication (HCC) Reported blood sugar readings at goal.  Continue Januvia  - Microalbumin / creatinine urine ratio  5. Overweight (BMI 25.0-29.9) Encourage patient to continue healthy eating habits and regular exercise and give it a little bit more time to see changes in weight.  Also advised that she can speak with her gastroenterologist the next time she is seen about whether it would be safe to use a GLP-1 agent given history of Crohn's disease with strictures in the colon.  I went over potential side effects of the medications including nausea/vomiting, pancreatitis, bowel blockage, diarrhea/constipation.  She has been bothered with diarrhea quite severe in the past which would be another concern for  me  6. Mild major depression (HCC) Plugged in with behavioral health.  7. Encounter for screening mammogram for malignant neoplasm of breast - MM 3D SCREENING MAMMOGRAM BILATERAL BREAST; Future   There are no diagnoses linked to this encounter.   Patient was given the opportunity to ask questions.  Patient verbalized understanding of the plan and was able to repeat key elements of the plan.   This documentation was completed using Paediatric nurse.  Any transcriptional errors are unintentional.  No orders of the defined types were placed in this encounter.    Requested Prescriptions    No prescriptions requested or ordered in this encounter    No follow-ups on file.  Barnie Louder, MD, FACP

## 2023-10-24 NOTE — Patient Instructions (Signed)
 VISIT SUMMARY:  Today, we discussed your cholesterol management and other health concerns. You are currently taking atorvastatin  for cholesterol, and we will check your cholesterol levels soon. Your blood sugar levels are well-controlled, and you are maintaining a healthy lifestyle with regular exercise. We also addressed your concerns about weight loss, the safety of massage chairs with your defibrillator, and your ongoing management of Crohn's disease, heart failure, and depression.  YOUR PLAN:  -DIABETES MELLITUS: Your blood sugar levels are well-controlled. We discussed weight loss medications, but due to your Crohn's disease, we decided to continue with your current diabetes management and lifestyle modifications. Diabetes is a condition where your blood sugar levels are too high.  -CROHN'S DISEASE: Your Crohn's disease is improving with Entyvio  infusions. We will continue with this treatment. Crohn's disease is a chronic inflammatory condition of the gastrointestinal tract.  -CONGESTIVE HEART FAILURE: Your heart failure is managed with spironolactone , furosemide , and potassium. You have a defibrillator, and we will consult your cardiologist about the safety of using massage chairs. Congestive heart failure is a condition where your heart does not pump blood as well as it should.  -HYPERLIPIDEMIA: Your cholesterol is managed with atorvastatin . We will order a lipid panel to check your current cholesterol levels and refer you to a cardiologist if your LDL cholesterol is not at the target level. Hyperlipidemia means you have high levels of fats (lipids) in your blood.  -DEGENERATIVE CHANGES OF THE SPINE WITH SCOLIOSIS: You have mild degenerative changes and scoliosis with occasional stiffness but no significant pain. We will refer you to orthopedics for further evaluation. Degenerative changes and scoliosis refer to the wear and tear of the spine and a sideways curvature of the  spine.  -DEPRESSION: Your depression is stable on your current antidepressants. We will continue with your current medications and follow up with your mental health provider as scheduled. Depression is a mood disorder that causes persistent feelings of sadness and loss of interest.  -GENERAL HEALTH MAINTENANCE: You are due for a mammogram and a urine microalbumin test. We will order these tests and verify Medicaid coverage for the mammogram at [facility]. Regular health maintenance includes routine tests and screenings to monitor your overall health.  INSTRUCTIONS:  Please follow up with your cardiologist regarding the use of massage chairs with your defibrillator. We will also need to check your cholesterol levels with a lipid panel. Additionally, please schedule your mammogram and urine microalbumin test as soon as possible.

## 2023-10-25 ENCOUNTER — Ambulatory Visit: Payer: Self-pay | Admitting: Internal Medicine

## 2023-10-25 LAB — LIPID PANEL
Chol/HDL Ratio: 4.1 ratio (ref 0.0–4.4)
Cholesterol, Total: 174 mg/dL (ref 100–199)
HDL: 42 mg/dL (ref >39)
LDL Chol Calc (NIH): 112 mg/dL — ABNORMAL HIGH (ref 0–99)
Triglycerides: 111 mg/dL (ref 0–149)
VLDL Cholesterol Cal: 20 mg/dL (ref 5–40)

## 2023-10-26 ENCOUNTER — Other Ambulatory Visit (HOSPITAL_COMMUNITY): Payer: Self-pay

## 2023-10-26 DIAGNOSIS — Z419 Encounter for procedure for purposes other than remedying health state, unspecified: Secondary | ICD-10-CM | POA: Diagnosis not present

## 2023-10-28 ENCOUNTER — Ambulatory Visit: Attending: Cardiology

## 2023-10-28 DIAGNOSIS — I5042 Chronic combined systolic (congestive) and diastolic (congestive) heart failure: Secondary | ICD-10-CM

## 2023-10-28 DIAGNOSIS — Z95810 Presence of automatic (implantable) cardiac defibrillator: Secondary | ICD-10-CM

## 2023-10-29 ENCOUNTER — Other Ambulatory Visit (HOSPITAL_COMMUNITY): Payer: Self-pay

## 2023-10-29 ENCOUNTER — Other Ambulatory Visit: Payer: Self-pay

## 2023-10-29 ENCOUNTER — Other Ambulatory Visit (HOSPITAL_BASED_OUTPATIENT_CLINIC_OR_DEPARTMENT_OTHER): Payer: Self-pay

## 2023-10-30 ENCOUNTER — Telehealth (HOSPITAL_COMMUNITY): Payer: Self-pay

## 2023-10-30 ENCOUNTER — Telehealth (HOSPITAL_COMMUNITY): Payer: Self-pay | Admitting: Pharmacy Technician

## 2023-10-30 ENCOUNTER — Other Ambulatory Visit: Payer: Self-pay | Admitting: Internal Medicine

## 2023-10-30 ENCOUNTER — Other Ambulatory Visit: Payer: Self-pay

## 2023-10-30 ENCOUNTER — Other Ambulatory Visit (HOSPITAL_COMMUNITY): Payer: Self-pay

## 2023-10-30 DIAGNOSIS — R232 Flushing: Secondary | ICD-10-CM

## 2023-10-30 MED ORDER — VEOZAH 45 MG PO TABS
45.0000 mg | ORAL_TABLET | Freq: Every day | ORAL | 6 refills | Status: AC
  Filled 2023-10-30: qty 30, 30d supply, fill #0
  Filled 2023-12-04: qty 30, 30d supply, fill #1
  Filled 2024-01-03: qty 30, 30d supply, fill #2
  Filled 2024-02-02: qty 30, 30d supply, fill #3
  Filled 2024-03-03 – 2024-03-06 (×2): qty 30, 30d supply, fill #4
  Filled 2024-04-02: qty 30, 30d supply, fill #5
  Filled 2024-04-30: qty 30, 30d supply, fill #6

## 2023-10-30 NOTE — Telephone Encounter (Signed)
 PA request has been Received. New Encounter has been or will be created for follow up. For additional info see Pharmacy Prior Auth telephone encounter from 10/30/23.

## 2023-10-30 NOTE — Telephone Encounter (Signed)
 Pharmacy Patient Advocate Encounter   Received notification from Pt Calls Messages that prior authorization for Veozah  45MG  tablets  is required/requested.   Insurance verification completed.   The patient is insured through [facility] [place] .   Per test claim: PA required; PA submitted to above mentioned insurance via CoverMyMeds Key/confirmation #/EOC [id number] Status is pending

## 2023-10-31 ENCOUNTER — Other Ambulatory Visit (HOSPITAL_COMMUNITY): Payer: Self-pay

## 2023-11-01 ENCOUNTER — Other Ambulatory Visit: Payer: Self-pay

## 2023-11-01 ENCOUNTER — Other Ambulatory Visit (HOSPITAL_COMMUNITY): Payer: Self-pay

## 2023-11-01 NOTE — Telephone Encounter (Signed)
 Please try to get prior approval on the Veozah .  She is not a good candidate for hormone replacement therapy due to her cardiovascular conditions.  She has been on Veozah  for more than 5 months now with good results.

## 2023-11-01 NOTE — Telephone Encounter (Signed)
 Pharmacy Patient Advocate Encounter  Received notification from [facility] Medicaid that Prior Authorization for Veozah  45MG  tablets  has been DENIED.  See denial reason below. No denial letter attached in CMM. Will attach denial letter to Media tab once received.   PA #/Case ID/Reference #: [id number]

## 2023-11-02 ENCOUNTER — Other Ambulatory Visit (HOSPITAL_COMMUNITY): Payer: Self-pay

## 2023-11-03 ENCOUNTER — Ambulatory Visit: Payer: Self-pay | Admitting: Cardiology

## 2023-11-04 ENCOUNTER — Other Ambulatory Visit (HOSPITAL_COMMUNITY): Payer: Self-pay

## 2023-11-04 ENCOUNTER — Ambulatory Visit (HOSPITAL_COMMUNITY): Attending: Cardiology

## 2023-11-04 DIAGNOSIS — I5042 Chronic combined systolic (congestive) and diastolic (congestive) heart failure: Secondary | ICD-10-CM | POA: Insufficient documentation

## 2023-11-04 NOTE — Progress Notes (Signed)
 EPIC Encounter for ICM Monitoring  Patient Name: Christine [name] is a 58 y.o. female Date: 11/04/2023 Primary Care Physican: Vicci Barnie NOVAK, MD Primary Cardiologist: Kensett/Bensimhon Electrophysiologist: Kennyth 05/27/2023 Weight: 182 - 183 lbs (baseline)          06/24/2023 Weight: 181 lbs 06/26/2023 Weight: 188 lbs          08/02/2023 Weight: 184-185 lbs  09/13/2023 Weight: 184 lbs   (6 lbs less than hospitalization)     10/24/2023 Office Weight: 183 lbs                            Transmission results reviewed.    HeartLogic Heart Failure Index remains 1 suggesting fluid levels are within normal threshold range.      Prescribed:  Furosemide  20 mg take 2 tablet(s) (40 mg total) by mouth twice a day Potassium 20 mEq take 4 tablet(s) (80 mEq total) by mouth two (2) times a day Spironolactone  25 mg take 1 tablet by mouth daily.    Labs: 09/03/2023 Creatinine 1.03, BUN 14, Potassium 4.1, Sodium 140  08/26/2023 Creatinine 1.08, BUN 15, Potassium 3.1, Sodium 136, GFR >60  08/25/2023 Creatinine 1.32, BUN 17, Potassium 4.9, Sodium 135, GFR 48  08/24/2023 Creatinine 1.31, BUN 25, Potassium 4.9, Sodium 137, GFR 48 07/29/2023 Creatinine 1.47, BUN 25, Potassium 3.2, Sodium 137  07/12/2023 Creatinine NA, BUN NA, Potassium 3.1, Sodium 136 (8:16 AM)  07/12/2023 Creatinine 1.49, BUN 33, Potassium 4.0, Sodium 131, GFR 41 (6:38 AM)  07/09/2023 Creatinine 1.46, BUN 32, Potassium 3.3, Sodium 135, GFR 42 A complete set of results can be found in Results Review.   Recommendations:  No changes.     Follow-up plan: ICM clinic phone appointment on 12/02/2023.   91 day device clinic remote transmission 01/22/2024.              EP/Cardiology next office visit:  11/06/2023 with HF Clinic.   Recall 03/15/2024 with Dr  Fernande.          Copy of ICM check sent to Dr. Kennyth.    3 Month HeartLogicT Heart Failure Index:    8 Day Data Trend:            [name], RN 11/04/2023 2:33 PM

## 2023-11-05 ENCOUNTER — Other Ambulatory Visit (HOSPITAL_COMMUNITY): Payer: Self-pay

## 2023-11-05 ENCOUNTER — Telehealth: Payer: Self-pay | Admitting: Internal Medicine

## 2023-11-05 ENCOUNTER — Telehealth (HOSPITAL_COMMUNITY): Payer: Self-pay

## 2023-11-05 MED FILL — Ivabradine HCl Tab 5 MG (Base Equiv): ORAL | 23 days supply | Qty: 23 | Fill #3 | Status: AC

## 2023-11-05 MED FILL — Ivabradine HCl Tab 5 MG (Base Equiv): ORAL | 7 days supply | Qty: 7 | Fill #3 | Status: AC

## 2023-11-05 NOTE — Telephone Encounter (Signed)
 Called to confirm/remind patient of their appointment at the [facility] on 11/06/23.   Appointment:   [x] Confirmed  [] Left mess   [] No answer/No voice mail  [] VM Full/unable to leave message  [] Phone not in service  Patient reminded to bring all medications and/or complete list.  Confirmed patient has transportation. Gave directions, instructed to utilize valet parking.

## 2023-11-05 NOTE — Telephone Encounter (Signed)
 Copied from CRM [id number]. Topic: Clinical - Prescription Issue >> Nov 05, 2023 10:49 AM Ivette P wrote:  Reason for CRM: Pt called in about her medication Fezolinetant  (VEOZAH ) 45 MG TABS, said pharmacy has not received. Based on chart seems to be reordered on 07/16.   10/30/23 1145 E-Sign Vicci Barnie NOVAK, MD Reorder from [phone]  Pt would like a callback with update [phone]

## 2023-11-06 ENCOUNTER — Ambulatory Visit (HOSPITAL_COMMUNITY): Payer: Self-pay | Admitting: Family Medicine

## 2023-11-06 ENCOUNTER — Encounter (HOSPITAL_COMMUNITY): Payer: Self-pay

## 2023-11-06 ENCOUNTER — Other Ambulatory Visit (HOSPITAL_COMMUNITY): Payer: Self-pay

## 2023-11-06 ENCOUNTER — Ambulatory Visit (HOSPITAL_COMMUNITY)
Admission: RE | Admit: 2023-11-06 | Discharge: 2023-11-06 | Disposition: A | Discharge status: Home / Self Care | Source: Ambulatory Visit | Attending: Family Medicine | Admitting: Family Medicine

## 2023-11-06 VITALS — BP 90/74 | HR 80 | Wt 187.0 lb

## 2023-11-06 DIAGNOSIS — Z7984 Long term (current) use of oral hypoglycemic drugs: Secondary | ICD-10-CM | POA: Insufficient documentation

## 2023-11-06 DIAGNOSIS — Z79899 Other long term (current) drug therapy: Secondary | ICD-10-CM | POA: Diagnosis not present

## 2023-11-06 DIAGNOSIS — I2510 Atherosclerotic heart disease of native coronary artery without angina pectoris: Secondary | ICD-10-CM | POA: Insufficient documentation

## 2023-11-06 DIAGNOSIS — I5022 Chronic systolic (congestive) heart failure: Secondary | ICD-10-CM | POA: Insufficient documentation

## 2023-11-06 DIAGNOSIS — Z87891 Personal history of nicotine dependence: Secondary | ICD-10-CM | POA: Insufficient documentation

## 2023-11-06 DIAGNOSIS — F32A Depression, unspecified: Secondary | ICD-10-CM | POA: Diagnosis not present

## 2023-11-06 DIAGNOSIS — I513 Intracardiac thrombosis, not elsewhere classified: Secondary | ICD-10-CM | POA: Diagnosis not present

## 2023-11-06 DIAGNOSIS — G4733 Obstructive sleep apnea (adult) (pediatric): Secondary | ICD-10-CM | POA: Insufficient documentation

## 2023-11-06 DIAGNOSIS — F419 Anxiety disorder, unspecified: Secondary | ICD-10-CM | POA: Diagnosis not present

## 2023-11-06 DIAGNOSIS — I714 Abdominal aortic aneurysm, without rupture, unspecified: Secondary | ICD-10-CM | POA: Insufficient documentation

## 2023-11-06 DIAGNOSIS — D751 Secondary polycythemia: Secondary | ICD-10-CM | POA: Diagnosis not present

## 2023-11-06 DIAGNOSIS — F909 Attention-deficit hyperactivity disorder, unspecified type: Secondary | ICD-10-CM | POA: Insufficient documentation

## 2023-11-06 DIAGNOSIS — Z7901 Long term (current) use of anticoagulants: Secondary | ICD-10-CM | POA: Insufficient documentation

## 2023-11-06 LAB — BASIC METABOLIC PANEL WITH GFR
Anion gap: 13 (ref 5–15)
BUN: 13 mg/dL (ref 6–20)
CO2: 24 mmol/L (ref 22–32)
Calcium: 9.6 mg/dL (ref 8.9–10.3)
Chloride: 101 mmol/L (ref 98–111)
Creatinine, Ser: 1.06 mg/dL — ABNORMAL HIGH (ref 0.44–1.00)
GFR, Estimated: >60 mL/min (ref >60)
Glucose, Bld: 95 mg/dL (ref 70–99)
Potassium: 3.9 mmol/L (ref 3.5–5.1)
Sodium: 138 mmol/L (ref 135–145)

## 2023-11-06 LAB — BRAIN NATRIURETIC PEPTIDE: B Natriuretic Peptide: 67.5 pg/mL (ref 0.0–100.0)

## 2023-11-06 NOTE — Patient Instructions (Signed)
 Medication Changes:  STOP SPIRONOLACTONE    Lab Work:  Labs done today, your results will be available in MyChart, we will contact you for abnormal readings.  Referrals:  YOU HAVE BEEN REFERRED TO [facility]INIC THEY WILL REACH OUT TO YOU OR CALL TO ARRANGE THIS. PLEASE CALL US  WITH ANY CONCERNS   Special Instructions // Education:  PLEASE LET US  KNOW IF YOUR BLOOD PRESSURE IS LESS THAN 90 FOR THE TOP NUMBER   Follow-Up in: 3 MONTHS WITH DR. CHERRIE PLEASE CALL OUR OFFICE AROUND SEPTEMBER TO GET SCHEDULED FOR YOUR APPOINTMENT. PHONE NUMBER IS [phone] OPTION 2   At the [facility], you and your health needs are our priority. We have a designated team specialized in the treatment of Heart Failure. This Care Team includes your primary Heart Failure Specialized Cardiologist (physician), Advanced Practice Providers (APPs- Physician Assistants and Nurse Practitioners), and Pharmacist who all work together to provide you with the care you need, when you need it.   You may see any of the following providers on your designated Care Team at your next follow up:  Dr. Toribio CHERRIE Dr. Ezra Shuck Dr. Ria Commander Dr. Odis Brownie Greig Mosses, NP Caffie Shed, [place] [facility] [employer], [place] Beckey Coe, NP [place] Lee, NP Tinnie Redman, PharmD   Please be sure to bring in all your medications bottles to every appointment.   Need to Contact Us :  If you have any questions or concerns before your next appointment please send us  a message through [place] or call our office at [phone].    TO LEAVE A MESSAGE FOR THE NURSE SELECT OPTION 2, PLEASE LEAVE A MESSAGE INCLUDING: YOUR NAME DATE OF BIRTH CALL BACK NUMBER REASON FOR CALL**this is important as we prioritize the call backs  YOU WILL RECEIVE A CALL BACK THE SAME DAY AS LONG AS YOU CALL BEFORE 4:00 PM

## 2023-11-06 NOTE — Telephone Encounter (Signed)
 Any update on appeal, friend? We cannot utilize HRT because of her coronary artery disease/ischemic heart disease. She is calling back saying insurance is requiring trial/failure of two medications. I am not sure what they are wanting her to try and was checking to see if they told you as part of the PA denial.

## 2023-11-06 NOTE — Addendum Note (Signed)
 Encounter addended by: Tita Andriette NOVAK, RN on: 11/06/2023 12:32 PM  Actions taken: Order list changed, Diagnosis association updated

## 2023-11-06 NOTE — Progress Notes (Signed)
 [facility]IN[facility] NOTE   PCP: Vicci Barnie NOVAK, MD Cardiology: Dr. Raford HF Cardiologist: Dr. Cherrie  HPI: Christine [name] is a 58 y.o. female with history of chronic tobacco use, ADHD, anxiety, depression, CAD, systolic HF   Presented to ED 89/2/77 with increased shortness of breath/tachycardia. Adderrall stopped. Echo with EF 20-25%,  LHC/RHC with single vessel CAD (occluded mLAD) elevated filling pressures and  moderately reduced CO. She did not undergo PCI as cMRI demonstrated nonviable myocardium in the infarct territory. CAD managed medically and she was placed on HF GDMT. ? blocker discontinued w/ reduced output.     EF did not improve. Repeat echo 05/11/21 EF < 20% LV severely dilated RV ok Mild MR. Referred for ICD.   CPX 05/2021 with very mild HF limitation with elevated Ve/VCO2 slope  S/p [employer] ICD 4/23.  Echo (4/24) showed improved EF 35-40%, garde 1 DD, RV ok  RHC on 07/12/23 that was c/w volume depletion w/ low CO. This improved w/ volume resuscitation in the cath lab (Initial hemodynamics RA 5, PA 20/11, PCW 8, FICK CI 1.96, TD CI 1.98, PAPi 1.8, Repeat hemodynamics after 300 cc fluid bolus PA 28/11, PW 10, FICK CI 2.1, TD CI 2.5). It was suspected she was keeping herself too dry w/ diuretics. She was instructed to reduce her lasix  in half but never did and continued to take lasix  80 mg bid + twice weekly metolazone .    Admitted 08/24/23 with shock in the setting of volume depletion. Hydrated w/ IVFs and diuretics and BP active meds held. Weaned off NE. She was restarted on corlanor  and low dose spironolactone . She was instructed to resume lasix  on 5/13 but at reduced dose of 40 mg bid. KCl regimen was also reduced. Metolazone  was discontinued. Farxiga  and Toprol  XL remained on hold at d/c.   Today she returns for HF follow up. Overall feeling more dizzy recently, BP 90s/60s. Had a BP of 70/40 last week. Breathing OK, feels it is a little labored today. She feels  better when weight is 185 lbs. Has started exercising. Did 30 minutes walking and 15 minutes on Nu Step at the gym without dyspnea. Wants to lose weight. Denies palpitations, abnormal bleeding, CP, edema, or PND/Orthopnea. Appetite ok. Weight at home 187 pounds. Taking all medications.   ROS: All systems negative except as listed in HPI, PMH and Problem List.  SH:  Social History   Socioeconomic History   Marital status: Single    Spouse name: Not on file   Number of children: 0   Years of education: 16   Highest education level: Bachelor's degree (e.g., BA, AB, BS)  Occupational History   Not on file  Tobacco Use   Smoking status: Former    Current packs/day: 0.00    Types: Cigarettes    Quit date: 01/15/2021    Years since quitting: 2.8    Passive exposure: Past   Smokeless tobacco: Never  Vaping Use   Vaping status: Never Used  Substance and Sexual Activity   Alcohol use: Not Currently   Drug use: Never   Sexual activity: Not on file  Other Topics Concern   Not on file  Social History Narrative   Not on file   Social Drivers of Health   Financial Resource Strain: Medium Risk (10/20/2023)   Overall Financial Resource Strain (CARDIA)    Difficulty of Paying Living Expenses: Somewhat hard  Food Insecurity: No Food Insecurity (10/20/2023)   Hunger Vital Sign  Worried About Programme researcher, broadcasting/film/video in the Last Year: Never true    Ran Out of Food in the Last Year: Never true  Transportation Needs: No Transportation Needs (10/20/2023)   PRAPARE - Administrator, Civil Service (Medical): No    Lack of Transportation (Non-Medical): No  Physical Activity: Sufficiently Active (10/20/2023)   Exercise Vital Sign    Days of Exercise per Week: 5 days    Minutes of Exercise per Session: 30 min  Recent Concern: Physical Activity - Insufficiently Active (09/03/2023)   Exercise Vital Sign    Days of Exercise per Week: 5 days    Minutes of Exercise per Session: 20 min  Stress:  Stress Concern Present (10/20/2023)   [employer] of Occupational Health - Occupational Stress Questionnaire    Feeling of Stress: Rather much  Social Connections: Socially Isolated (10/20/2023)   Social Connection and Isolation Panel    Frequency of Communication with Friends and Family: Three times a week    Frequency of Social Gatherings with Friends and Family: Once a week    Attends Religious Services: Never    Database administrator or Organizations: No    Attends Engineer, structural: Not on file    Marital Status: Never married  Intimate Partner Violence: Not At Risk (09/03/2023)   Humiliation, Afraid, Rape, and Kick questionnaire    Fear of Current or Ex-Partner: No    Emotionally Abused: No    Physically Abused: No    Sexually Abused: No   FH:  Family History  Problem Relation Age of Onset   Eczema Mother    Stroke Mother    Atrial fibrillation Mother    Heart failure Father    Asthma Brother    Stomach cancer Maternal Grandfather    Liver cancer Neg Hx    Esophageal cancer Neg Hx    Colon polyps Neg Hx    Colon cancer Neg Hx    Allergic rhinitis Neg Hx    Angioedema Neg Hx    Urticaria Neg Hx    Past Medical History:  Diagnosis Date   Allergy     Anxiety 01/20/2021   CAD in native artery 08/24/2021   CHF (congestive heart failure) (HCC)    Chronic combined systolic and diastolic heart failure (HCC) 08/24/2021   Complication of anesthesia    Crohn's colitis (HCC)    Depression    Hyperlipidemia    Myocardial infarction [facility])    Presence of cardiac defibrillator 07/2021   Shortness of breath 01/20/2021   Sleep apnea 07/2021   Tobacco abuse 01/20/2021   Urticaria    Current Outpatient Medications  Medication Sig Dispense Refill   Accu-Chek Softclix Lancets lancets Use as instructed.  Check blood sugar daily before breakfast. 100 each 12   acetaminophen  (TYLENOL ) 500 MG tablet Take 1,000 mg by mouth 3 (three) times daily as needed for moderate  pain or headache.     ALPRAZolam  (XANAX ) 1 MG tablet Take 2 mg by mouth 3 (three) times daily.     alum & mag hydroxide-simeth (MAALOX/MYLANTA) 200-200-20 MG/5ML suspension Take 30 mLs by mouth every 4 (four) hours as needed for indigestion or heartburn. 355 mL 0   apixaban  (ELIQUIS ) 5 MG TABS tablet Take 1 tablet (5 mg total) by mouth 2 (two) times daily. 60 tablet 11   atorvastatin  (LIPITOR ) 80 MG tablet Take 1 tablet (80 mg total) by mouth daily. 90 tablet 3   Blood Glucose Monitoring Suppl (  ACCU-CHEK GUIDE) w/Device KIT Check blood sugar once daily before breakfast 1 kit 0   buPROPion  (WELLBUTRIN  XL) 150 MG 24 hr tablet Take 150 mg by mouth daily.     cetirizine  (ZYRTEC  ALLERGY ) 10 MG tablet Take 1 tablet (10 mg total) by mouth 2 (two) times daily. 60 tablet 5   cyclobenzaprine  (FLEXERIL ) 10 MG tablet Take 10 mg by mouth 3 (three) times daily as needed for muscle spasms.     dapagliflozin  propanediol (FARXIGA ) 10 MG TABS tablet Take 1 tablet (10 mg total) by mouth daily before breakfast. 90 tablet 3   dicyclomine  (BENTYL ) 10 MG capsule Take 1 capsule (10 mg total) by mouth every 6 (six) hours as needed for spasms. 60 capsule 3   diphenoxylate -atropine  (LOMOTIL ) 2.5-0.025 MG tablet Take 2 tablets by mouth 4 (four) times daily as needed for diarrhea/ loose stools. 60 tablet 3   Eszopiclone  3 MG TABS Take 1 tablet (3 mg total) by mouth at bedtime. Take immediately before bedtime 30 tablet 5   famotidine  (PEPCID ) 20 MG tablet Take 1 tablet (20 mg total) by mouth 2 (two) times daily. 60 tablet 5   feeding supplement (ENSURE ENLIVE / ENSURE PLUS) LIQD Take 237 mLs by mouth 2 (two) times daily between meals. 237 mL 12   Fezolinetant  (VEOZAH ) 45 MG TABS Take 1 tablet (45 mg total) by mouth daily. 30 tablet 6   fluticasone  (FLONASE ) 50 MCG/ACT nasal spray Place 1-2 sprays into both nostrils daily as needed (nasal congestion). 16 g 5   furosemide  (LASIX ) 20 MG tablet Take 2 tablets (40 mg total) by mouth  2 (two) times daily. 120 tablet 3   glucose blood test strip Use as instructed to check blood sugar daily before breakfast 100 each 6   ivabradine  (CORLANOR ) 5 MG TABS tablet Take 1/2 tablet (2.5 mg total) by mouth 2 (two) times daily. 30 tablet 5   lipase/protease/amylase (CREON ) 36000 UNITS CPEP capsule Take 1 capsule (36,000 Units total) by mouth in the morning, at noon, and at bedtime. 270 capsule 1   metroNIDAZOLE  (METROGEL ) 1 % gel Apply 1 Application topically daily as needed (rash).     Multiple Vitamins-Minerals (MULTIVITAMIN WITH MINERALS) tablet Take 1 tablet by mouth daily.     oxymetazoline (AFRIN) 0.05 % nasal spray Place 1-2 sprays into both nostrils 2 (two) times daily as needed for congestion.     potassium chloride  SA (KLOR-CON  M) 20 MEQ tablet Take 4 tablets (80 mEq total) by mouth 2 (two) times daily. 240 tablet 3   predniSONE  (DELTASONE ) 20 MG tablet Take 1 tablet by mouth daily x 3 days then 1/2 tablet daily x 4 days 5 tablet 0   promethazine  (PHENERGAN ) 25 MG tablet Take 1 tablet (25 mg total) by mouth every 6 (six) to 8 (eight) hours for nausea or vomiting. 90 tablet 3   sitaGLIPtin  (JANUVIA ) 50 MG tablet Take 1 tablet (50 mg total) by mouth daily. 90 tablet 1   spironolactone  (ALDACTONE ) 25 MG tablet Take 0.5 tablets (12.5 mg total) by mouth at bedtime. 15 tablet 3   triamcinolone  cream (KENALOG ) 0.1 % Apply 1 Application topically daily as needed (irritation).     valACYclovir  (VALTREX ) 500 MG tablet Take 1 tablet (500 mg total) by mouth 2 (two) times daily. 6 tablet 2   vedolizumab  (ENTYVIO ) 300 MG injection Inject 300 mg into the vein every 2 (two) months.     Vitamin D , Ergocalciferol , (DRISDOL ) 1.25 MG (50000 UNIT) CAPS capsule  Take 1 capsule (50,000 Units total) by mouth every 7 (seven) days. 12 capsule 1   No current facility-administered medications for this encounter.   BP 90/74   Pulse 80   Wt 84.8 kg (187 lb)   SpO2 96%   BMI 26.83 kg/m   Wt Readings from  Last 3 Encounters:  11/06/23 84.8 kg (187 lb)  10/24/23 83 kg (183 lb)  10/10/23 83.9 kg (185 lb)   PHYSICAL EXAM: General:  NAD. No resp difficulty, walked into clinic HEENT: Normal Neck: Supple. No JVD. Cor: Regular rate & rhythm. No rubs, gallops or murmurs. Lungs: Clear Abdomen: Soft, nontender, nondistended.  Extremities: No cyanosis, clubbing, rash, edema Neuro: Alert & oriented x 3, moves all 4 extremities w/o difficulty. Affect pleasant.  Device interrogation (personally reviewed): HL score 1, stable thoracic impedence, average HR 69 bpm, 1.1 hr/day activity, no VT  ASSESSMENT & PLAN: 1. 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 Echo EF 20-25%, severely dilated LV, RV okay, moderate MR - R/LHC (10/22): 1v CAD occluded mid LAD PCWP 27,  Fick 4.7/2.  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 No viability. RV okay. Not sure how to explain inferior defects on cMRI  - Echo (05/11/21) EF < 20% RV ok - CPX 1/23 with very mild HF limitation:  Peak VO2: 18.8 (90% predicted peak VO2) VE/VCO2 slope: 33 Peak RER: 1.14  - s/p [employer] ICD 4/23. - Echo (4/24): EF 35-40%, RV ok - Echo (3/25): EF 30-35%, RV low normal - NYHA II. Volume OK on exam and HL score. GDMT limited by low BP - With low BP, stop spiro. Notify clinic if sBP< 90  - Continue Ivabradine  2.5 mg bid.  - Continue Farxiga  10 mg daily. - Continue Lasix  40 mg bid. - No BP room to add beta blocker - Completed CPX 11/04/23, awaiting results. - Labs today.  2. CAD - Single vessel LAD occlusion on LHC. - No chest pain.  - No ASA with Eliquis  use  - Continue atorvastatin  80 mg daily.  - LDL 112 (10/24/23), goal < 55 - Refer to Lipid Clinic for PCSK9i  3. H/O LV apical thrombus - Continue Eliquis . No bleeding  4. OSA - Mild on sleep study, AHI 11.8 - On CPAP.  5. Polycythemia - Follows with Heme/Onc. -  JAK negative  6. AAA - CT scan 11/23  3.1cm - Follow u/s in 3 years recommended (02/2025)  Follow up in 3 months with Dr. Bensimhon.  Harlene CHRISTELLA Gainer, FNP  12:10 PM

## 2023-11-06 NOTE — Telephone Encounter (Signed)
 Copied from CRM [id number]. Topic: Clinical - Prescription Issue >> Nov 05, 2023 10:49 AM Ivette P wrote:  Reason for CRM: Pt called in about her medication Fezolinetant  (VEOZAH ) 45 MG TABS, said pharmacy has not received. Based on chart seems to be reordered on 07/16.   10/30/23 1145 E-Sign Vicci Barnie NOVAK, MD Reorder from [phone]  Pt would like a callback with update [phone]  >> Nov 06, 2023  1:14 PM Montie POUR wrote: Janey is calling back to see if Dr. Vicci would call her in another medication for hot flashes. Insurance told her that she has to try 2 different medications before they would approve above medications. Please call her at [phone] to discuss.

## 2023-11-07 ENCOUNTER — Other Ambulatory Visit (HOSPITAL_COMMUNITY): Payer: Self-pay

## 2023-11-07 ENCOUNTER — Telehealth: Payer: Self-pay

## 2023-11-07 ENCOUNTER — Encounter: Payer: Self-pay | Admitting: Primary Care

## 2023-11-07 ENCOUNTER — Other Ambulatory Visit: Payer: Self-pay

## 2023-11-07 ENCOUNTER — Ambulatory Visit: Admitting: Primary Care

## 2023-11-07 VITALS — BP 88/62 | HR 87 | Temp 98.8°F | Ht 70.0 in | Wt 186.2 lb

## 2023-11-07 DIAGNOSIS — I5042 Chronic combined systolic (congestive) and diastolic (congestive) heart failure: Secondary | ICD-10-CM | POA: Diagnosis not present

## 2023-11-07 DIAGNOSIS — G4733 Obstructive sleep apnea (adult) (pediatric): Secondary | ICD-10-CM

## 2023-11-07 DIAGNOSIS — R0602 Shortness of breath: Secondary | ICD-10-CM | POA: Diagnosis not present

## 2023-11-07 MED ORDER — ESZOPICLONE 3 MG PO TABS
3.0000 mg | ORAL_TABLET | Freq: Every day | ORAL | 5 refills | Status: DC
  Filled 2023-11-07: qty 15, 15d supply, fill #0
  Filled 2023-12-01: qty 15, 15d supply, fill #1
  Filled 2023-12-16 – 2023-12-18 (×2): qty 15, 15d supply, fill #2
  Filled 2024-01-09: qty 15, 15d supply, fill #3
  Filled ????-??-??: fill #2

## 2023-11-07 NOTE — Progress Notes (Signed)
 @Patient  ID: Christine [name], female    DOB: [birth year], 58 y.o.   MRN: [id number]  Chief Complaint  Patient presents with   Follow-up    F/U PFT, wearing CPAP doing well    Referring provider: Vicci Barnie NOVAK, MD  HPI:  58 year old female, former smoker quit in 2022.  Past medical history significant for abdominal aortic aneurysm, coronary artery disease, cardiomyopathy, combined systolic and diastolic heart failure, hypertension, paroxysmal tachycardia, obstructive sleep apnea, Crohn's disease, acute pancreatitis, polycythemia, status post ICD, obesity.  Home sleep study 07/05/2021 showed mild obstructive sleep apnea, AHI 11.8/h with SpO2 low 83%.  Patient spent 2.5 minutes with O2 saturation less than 88%.  Minimal snoring was present.  No central sleep apneas were noted.   07/23/2023 - Interim hx  Discussed the use of AI scribe software for clinical note transcription with the patient, who gave verbal consent to proceed.  History of Present Illness   Christine [name] is a 57 year old female with obstructive sleep apnea and cardiac issues who presents with shortness of breath. She was referred by her primary care physician for her history of sleep apnea and shortness of breath.  She has experienced increased shortness of breath over the past month. The shortness of breath occurs during the day and is not associated with physical activity. She describes the sensation as 'not getting enough air coming in'. No congestion or cough. She has a significant smoking history of approximately 40 years but has since quit.  She underwent a home sleep study in 2023, which showed mild obstructive sleep apnea with an average of 11.8 apneic events per hour and mild oxygen desaturations, with the lowest being 83%. She uses a CPAP machine with settings of 4 to 12 cm H2O but has had difficulty using it consistently over the past few weeks due to heart issues and medication adjustments. She uses the  CPAP machine 15 out of the last 30 nights, with an average apnea score of 3.7 events per hour. She has difficulty falling and staying asleep, despite using Ambien  for 15-20 years, and has tried prazosin and melatonin without significant improvement.  She has a history of coronary artery disease and underwent a heart catheterization in March 2025. Dr. Cherrie suspected patient to be euvolemic and over diuresed, suggested weight goal 189-192lbs. Her current weight is 185 pounds. She has a history of low blood pressure, typically in the 90s/60s, and has never been on medication for hypertension. She takes Lasix  80 mg twice a day. She has an ICD in place and follows up with cardiology regularly.  She has Crohn's disease, which has led to malabsorption issues, particularly with potassium, resulting in dangerously low levels. Her potassium level was 5.2 in March 2025.   Typical bedtime is between 1130pm and 12:30 AM.  Patient reports takes hours to fall asleep.  She wakes up 3-4 times a night.  She starts her day between 730 and 8 AM.  She reports 70 pound weight loss over the last 2 years.  She had a sleep study done in 2023 with Dr. Randine Bihari.    Airview download 04/24/2023 - 07/22/2023 Usage days 70/90 days (78%); 42 days (47%) greater than 4 hours Average usage 3 hours 38 minutes Pressure 4 to 15 cm H2O Air leaks 10.9 L/min (95%) AHI 2.7  11/07/2023- Interim hx  Discussed the use of AI scribe software for clinical note transcription with the patient, who gave verbal consent to proceed.  History of  Present Illness   Christine [name] is a 58 year old female with mild obstructive sleep apnea who presents for follow-up on her breathing test and CPAP compliance. She was referred by her primary care physician for evaluation of her history of sleep apnea and shortness of breath.  She has been experiencing shortness of breath over the past month, described as 'air hunger,' with a slight improvement  but not complete resolution. Previous breathing tests, including one two years ago and another in June, showed normal lung function at 98% with no evidence of obstructive lung disease or COPD. A borderline bronchodilator response after albuterol  was noted.  She has a history of mild obstructive sleep apnea, confirmed by a sleep study in 2023. She uses a CPAP machine consistently every night, with a compliance rate of 97% and an average usage of five hours and twenty-seven minutes per night. Her apnea score is 0.9, indicating well-controlled sleep apnea on the current pressure settings.  She has heart failure with an ejection fraction of 30% and was hospitalized in May due to low blood pressure, which has been problematic since April or May. She is currently on a 3 mg dose of Lunesta , which she finds helpful for sleep without causing daytime grogginess. She inquires if Lunesta  could affect her blood pressure, which is not a common side effect and is considered safe to continue.  She has a history of smoking, which she quit in 2022. No wheezing or persistent cough. She is scheduled for a cardiopulmonary exercise test, which she completed on Monday, but the results are not yet available.       Allergies  Allergen Reactions   Gabapentin Diarrhea   Sulfa Antibiotics Hives, Itching and Rash   Erythromycin Hives   Tramadol  Rash and Other (See Comments)    Urinary retention     Ibuprofen Itching   Tape Rash    Immunization History  Administered Date(s) Administered   Hepb-cpg 10/04/2022, 11/06/2022   Influenza, Seasonal, Injecte, Preservative Fre 01/15/2023   Influenza,inj,Quad PF,6+ Mos 02/06/2021, 01/09/2022   PFIZER(Purple Top)SARS-COV-2 Vaccination 08/29/2019, 09/23/2019, 04/02/2020   PNEUMOCOCCAL CONJUGATE-20 02/06/2021   Tdap 09/05/2021   Zoster Recombinant(Shingrix ) 05/11/2022, 01/29/2023    Past Medical History:  Diagnosis Date   Allergy     Anxiety 01/20/2021   CAD in native  artery 08/24/2021   CHF (congestive heart failure) (HCC)    Chronic combined systolic and diastolic heart failure (HCC) 08/24/2021   Complication of anesthesia    Crohn's colitis (HCC)    Depression    Hyperlipidemia    Myocardial infarction (HCC)    Presence of cardiac defibrillator 07/2021   Shortness of breath 01/20/2021   Sleep apnea 07/2021   Tobacco abuse 01/20/2021   Urticaria     Tobacco History: Social History   Tobacco Use  Smoking Status Former   Current packs/day: 0.00   Types: Cigarettes   Quit date: 01/15/2021   Years since quitting: 2.8   Passive exposure: Past  Smokeless Tobacco Never   Counseling given: Not Answered   Outpatient Medications Prior to Visit  Medication Sig Dispense Refill   Accu-Chek Softclix Lancets lancets Use as instructed.  Check blood sugar daily before breakfast. 100 each 12   acetaminophen  (TYLENOL ) 500 MG tablet Take 1,000 mg by mouth 3 (three) times daily as needed for moderate pain or headache.     ALPRAZolam  (XANAX ) 1 MG tablet Take 2 mg by mouth 3 (three) times daily.     alum &  mag hydroxide-simeth (MAALOX/MYLANTA) 200-200-20 MG/5ML suspension Take 30 mLs by mouth every 4 (four) hours as needed for indigestion or heartburn. 355 mL 0   apixaban  (ELIQUIS ) 5 MG TABS tablet Take 1 tablet (5 mg total) by mouth 2 (two) times daily. 60 tablet 11   atorvastatin  (LIPITOR ) 80 MG tablet Take 1 tablet (80 mg total) by mouth daily. 90 tablet 3   Blood Glucose Monitoring Suppl (ACCU-CHEK GUIDE) w/Device KIT Check blood sugar once daily before breakfast 1 kit 0   buPROPion  (WELLBUTRIN  XL) 150 MG 24 hr tablet Take 150 mg by mouth daily.     cetirizine  (ZYRTEC  ALLERGY ) 10 MG tablet Take 1 tablet (10 mg total) by mouth 2 (two) times daily. 60 tablet 5   cyclobenzaprine  (FLEXERIL ) 10 MG tablet Take 10 mg by mouth 3 (three) times daily as needed for muscle spasms.     dapagliflozin  propanediol (FARXIGA ) 10 MG TABS tablet Take 1 tablet (10 mg total) by  mouth daily before breakfast. 90 tablet 3   dicyclomine  (BENTYL ) 10 MG capsule Take 1 capsule (10 mg total) by mouth every 6 (six) hours as needed for spasms. 60 capsule 3   diphenoxylate -atropine  (LOMOTIL ) 2.5-0.025 MG tablet Take 2 tablets by mouth 4 (four) times daily as needed for diarrhea/ loose stools. 60 tablet 3   famotidine  (PEPCID ) 20 MG tablet Take 1 tablet (20 mg total) by mouth 2 (two) times daily. 60 tablet 5   feeding supplement (ENSURE ENLIVE / ENSURE PLUS) LIQD Take 237 mLs by mouth 2 (two) times daily between meals. 237 mL 12   Fezolinetant  (VEOZAH ) 45 MG TABS Take 1 tablet (45 mg total) by mouth daily. 30 tablet 6   fluticasone  (FLONASE ) 50 MCG/ACT nasal spray Place 1-2 sprays into both nostrils daily as needed (nasal congestion). 16 g 5   furosemide  (LASIX ) 20 MG tablet Take 2 tablets (40 mg total) by mouth 2 (two) times daily. 120 tablet 3   glucose blood test strip Use as instructed to check blood sugar daily before breakfast 100 each 6   ivabradine  (CORLANOR ) 5 MG TABS tablet Take 1/2 tablet (2.5 mg total) by mouth 2 (two) times daily. 30 tablet 5   lipase/protease/amylase (CREON ) 36000 UNITS CPEP capsule Take 1 capsule (36,000 Units total) by mouth in the morning, at noon, and at bedtime. 270 capsule 1   metroNIDAZOLE  (METROGEL ) 1 % gel Apply 1 Application topically daily as needed (rash).     Multiple Vitamins-Minerals (MULTIVITAMIN WITH MINERALS) tablet Take 1 tablet by mouth daily.     oxymetazoline (AFRIN) 0.05 % nasal spray Place 1-2 sprays into both nostrils 2 (two) times daily as needed for congestion.     potassium chloride  SA (KLOR-CON  M) 20 MEQ tablet Take 4 tablets (80 mEq total) by mouth 2 (two) times daily. 240 tablet 3   predniSONE  (DELTASONE ) 20 MG tablet Take 1 tablet by mouth daily x 3 days then 1/2 tablet daily x 4 days 5 tablet 0   promethazine  (PHENERGAN ) 25 MG tablet Take 1 tablet (25 mg total) by mouth every 6 (six) to 8 (eight) hours for nausea or  vomiting. 90 tablet 3   sitaGLIPtin  (JANUVIA ) 50 MG tablet Take 1 tablet (50 mg total) by mouth daily. 90 tablet 1   triamcinolone  cream (KENALOG ) 0.1 % Apply 1 Application topically daily as needed (irritation).     valACYclovir  (VALTREX ) 500 MG tablet Take 1 tablet (500 mg total) by mouth 2 (two) times daily. 6 tablet 2  vedolizumab  (ENTYVIO ) 300 MG injection Inject 300 mg into the vein every 2 (two) months.     Vitamin D , Ergocalciferol , (DRISDOL ) 1.25 MG (50000 UNIT) CAPS capsule Take 1 capsule (50,000 Units total) by mouth every 7 (seven) days. 12 capsule 1   Eszopiclone  3 MG TABS Take 1 tablet (3 mg total) by mouth at bedtime. Take immediately before bedtime 30 tablet 5   No facility-administered medications prior to visit.   Review of Systems  Review of Systems  Constitutional: Negative.   Respiratory: Negative.      Physical Exam  BP (!) 88/62 (BP Location: Left Arm, Patient Position: Sitting, Cuff Size: Large)   Pulse 87   Temp 98.8 F (37.1 C) (Oral)   Ht 5' 10 (1.778 m)   Wt 186 lb 3.2 oz (84.5 kg)   SpO2 96%   BMI 26.72 kg/m  Physical Exam Constitutional:      Appearance: Normal appearance.  HENT:     Head: Normocephalic and atraumatic.  Cardiovascular:     Rate and Rhythm: Normal rate and regular rhythm.  Pulmonary:     Effort: Pulmonary effort is normal.     Breath sounds: Normal breath sounds.  Musculoskeletal:        General: Normal range of motion.  Skin:    General: Skin is warm and dry.  Neurological:     General: No focal deficit present.     Mental Status: She is alert and oriented to person, place, and time. Mental status is at baseline.  Psychiatric:        Mood and Affect: Mood normal.        Behavior: Behavior normal.        Thought Content: Thought content normal.        Judgment: Judgment normal.      Lab Results:  CBC    Component Value Date/Time   WBC 6.5 08/25/2023 1256   RBC 4.58 08/25/2023 1256   HGB 13.6 08/25/2023 1256    HGB 16.0 (H) 04/22/2023 1356   HCT 40.9 08/25/2023 1256   HCT 47.9 (H) 04/22/2023 1356   PLT 309 08/25/2023 1256   PLT 361 04/22/2023 1356   MCV 89.3 08/25/2023 1256   MCV 84 04/22/2023 1356   MCH 29.7 08/25/2023 1256   MCHC 33.3 08/25/2023 1256   RDW 13.7 08/25/2023 1256   RDW 12.5 04/22/2023 1356   LYMPHSABS 1.8 08/24/2023 1454   LYMPHSABS 1.6 04/22/2023 1356   MONOABS 0.9 08/24/2023 1454   EOSABS 0.3 08/24/2023 1454   EOSABS 0.5 (H) 04/22/2023 1356   BASOSABS 0.1 08/24/2023 1454   BASOSABS 0.1 04/22/2023 1356    BMET    Component Value Date/Time   NA 138 11/06/2023 1241   NA 140 09/03/2023 1521   K 3.9 11/06/2023 1241   CL 101 11/06/2023 1241   CO2 24 11/06/2023 1241   GLUCOSE 95 11/06/2023 1241   BUN 13 11/06/2023 1241   BUN 14 09/03/2023 1521   CREATININE 1.06 (H) 11/06/2023 1241   CREATININE 0.90 12/25/2021 1119   CALCIUM  9.6 11/06/2023 1241   GFRNONAA >60 11/06/2023 1241   GFRNONAA >60 12/25/2021 1119   GFRAA  03/31/2008 1140    >60        The eGFR has been calculated using the MDRD equation. This calculation has not been validated in all clinical    BNP    Component Value Date/Time   BNP 67.5 11/06/2023 1228    ProBNP  Component Value Date/Time   PROBNP 120.0 08/24/2023 1910   PROBNP 328 (H) 01/17/2022 1512    Imaging: CUP PACEART REMOTE DEVICE CHECK Result Date: 10/24/2023 ICD: Scheduled remote reviewed. Normal device function.  Presenting rhythm: VS Next remote 91 days. ML, CVRS    Assessment & Plan:   1. Obstructive sleep apnea (Primary)  2. Shortness of breath  Assessment and Plan    Shortness of breath Shortness of breath has improved since April but is not completely resolved. Pulmonary function tests show normal lung function at 98% with no evidence of obstructive lung disease. Shortness of breath is more likely related to heart failure. - Prescribe albuterol  inhaler for use as needed for shortness of breath, chest tightness,  or wheezing. - Instruct to use two puffs every 4-6 hours as needed, but avoid overuse. - Contact provider if albuterol  is used daily or if symptoms persist.  Heart failure Heart failure with an ejection fraction of 30%. Blood pressure issues noted since April or May, with hospitalization in May for hypotension. No indication that Lunesta  is affecting blood pressure; more likely related to heart failure. - Follow up with Dr. Bettyjane for heart failure management.  Obstructive sleep apnea Mild obstructive sleep apnea diagnosed in 2023. CPAP compliance is excellent with 97% usage and an average of 5 hours and 27 minutes per night. Apnea score is 0.9, indicating well-controlled sleep apnea. Discussed risks of untreated sleep apnea, including cardiac arrhythmias, pulmonary hypertension, stroke, diabetes, and Alzheimer's disease. - Continue CPAP therapy nightly. - Schedule annual follow-up for sleep apnea management and CPAP supply renewal.  Smoking cessation Smoking cessation in 2022. No current evidence of obstructive lung disease or COPD.  Insomnia Request for a 30-day supply of Lunesta  3 mg instead of a 15-day supply. No residual daytime grogginess reported. Advised not to take Lunesta  if driving, and to avoid combining with other sedatives or alcohol. - Prescribe 30-day supply of Lunesta  3 mg with refills. - Plan video visit in six months for Lunesta  prescription review.   Almarie LELON Ferrari, NP 11/11/2023

## 2023-11-07 NOTE — Patient Instructions (Signed)
  VISIT SUMMARY: Today, we discussed your ongoing issues with shortness of breath, heart failure, obstructive sleep apnea, and insomnia. We reviewed your recent breathing tests and CPAP compliance, and we talked about your current medications and their effects.  YOUR PLAN: -SHORTNESS OF BREATH: Your shortness of breath has improved but is not completely resolved. Your lung function is normal, and there is no evidence of chronic obstructive pulmonary disease (COPD). The shortness of breath is likely related to your heart failure. You are prescribed an albuterol  inhaler to use as needed for shortness of breath, chest tightness, or wheezing. Use two puffs every 4-6 hours as needed, but avoid overuse. Contact us  if you need to use albuterol  daily or if your symptoms persist.  -HEART FAILURE: Heart failure means your heart is not pumping blood as well as it should. Your shortness of breath is likely related to this condition. You should follow up with Dr. Bensimon for heart failure management.  -OBSTRUCTIVE SLEEP APNEA: Obstructive sleep apnea is a condition where your breathing stops and starts during sleep. Your CPAP compliance is excellent, and your sleep apnea is well-controlled. Continue using your CPAP machine every night. We will schedule an annual follow-up for sleep apnea management and CPAP supply renewal.  -SMOKING CESSATION: You quit smoking in 2022, which is excellent. There is no current evidence of obstructive lung disease or COPD.  -INSOMNIA: Insomnia is difficulty falling or staying asleep. You are prescribed a 30-day supply of Lunesta  3 mg, which you find helpful for sleep without causing daytime grogginess. Do not take Lunesta  if you will be driving, and avoid combining it with other sedatives or alcohol. We will have a video visit in six months to review your prescription.  INSTRUCTIONS: Please follow up with Dr. Bettyjane for heart failure management. Contact us  if you need to use  albuterol  daily or if your symptoms persist. We will schedule an annual follow-up for sleep apnea management and CPAP supply renewal. We will also have a video visit in six months to review your Lunesta  prescription.  Follow-up 6 months with [name] NP for CPAP compliance and insomnia- virtual visit on Friday aftrnoon

## 2023-11-07 NOTE — Telephone Encounter (Signed)
 Prior authorization appeal for Veozah  faxed to [employer] today. Faxed as expedited appeal to [phone]

## 2023-11-08 ENCOUNTER — Encounter: Payer: Self-pay | Admitting: Internal Medicine

## 2023-11-08 ENCOUNTER — Other Ambulatory Visit (HOSPITAL_COMMUNITY): Payer: Self-pay

## 2023-11-08 ENCOUNTER — Other Ambulatory Visit: Payer: Self-pay

## 2023-11-09 ENCOUNTER — Other Ambulatory Visit (HOSPITAL_COMMUNITY): Payer: Self-pay

## 2023-11-11 ENCOUNTER — Other Ambulatory Visit (HOSPITAL_COMMUNITY): Payer: Self-pay

## 2023-11-11 ENCOUNTER — Telehealth: Payer: Self-pay

## 2023-11-11 MED ORDER — ALBUTEROL SULFATE HFA 108 (90 BASE) MCG/ACT IN AERS
2.0000 | INHALATION_SPRAY | Freq: Four times a day (QID) | RESPIRATORY_TRACT | 1 refills | Status: AC | PRN
  Filled 2023-11-11: qty 6.7, 25d supply, fill #0
  Filled 2023-11-14: qty 18, 16d supply, fill #0

## 2023-11-11 NOTE — Telephone Encounter (Signed)
 Christine [name],   Pt is requesting a refill on Lunesta  30 day supply. Please advise

## 2023-11-12 ENCOUNTER — Other Ambulatory Visit: Payer: Self-pay

## 2023-11-12 ENCOUNTER — Other Ambulatory Visit (HOSPITAL_COMMUNITY): Payer: Self-pay

## 2023-11-12 ENCOUNTER — Ambulatory Visit
Admission: RE | Admit: 2023-11-12 | Discharge: 2023-11-12 | Disposition: A | Discharge status: Home / Self Care | Source: Ambulatory Visit | Attending: Internal Medicine | Admitting: Internal Medicine

## 2023-11-12 ENCOUNTER — Telehealth: Payer: Self-pay

## 2023-11-12 DIAGNOSIS — Z1231 Encounter for screening mammogram for malignant neoplasm of breast: Secondary | ICD-10-CM | POA: Diagnosis not present

## 2023-11-12 NOTE — Telephone Encounter (Signed)
 Remote transmission received. Normal device function. Presenting rhythm, VS 61 bpm. No episodes noted.   Patient denies any associated symptoms with heart rate increase on watch. Patient advised no alerts noted. Advised patient that fitbit watches are not as reliable as implanted cardiac devices and the watch may pick up extra beats in the bottom part of the heart.   Advised against/reducing caffeine, staying well hydrated and avoiding stimulants. Instructed patient to let us  know if any symptoms such as chest pain, palpitations, shortness of breath, dizziness, lightheadedness or any other concerns arise. Patient voiced understanding and agreeable with plan.

## 2023-11-12 NOTE — Telephone Encounter (Signed)
 Returned call to patient per voice mail message.    She reports having intermittent episodes of increased HR since wearing new fitbit a week ago.    The first episode occurred 7/22 Tuesday night, HR increased from 60 bpm to 127 bpm between 8 PM and midnight while watching TV.  She also confirmed the rate with her BP cuff which showed the same HR.  She did not have have any other symptoms at that episode.   She had a few more episodes always occurring late in the evening this past weekend of 7/25.    The last episode occurred 7/27 from 9 pm to 12 midnight and HR again increased from 65-70 to the 120's.  The last few episodes she has feels her heart fluttering.  Again the episodes occurred at rest.   Her baseline HR has been rising from 65-70 to 80-85 over the last few days.  She is compliant with meds.  Last [employer] report transmitted 7/28.  Sent to Device clinic triage for review and follow up.

## 2023-11-12 NOTE — Telephone Encounter (Signed)
 It was last dispensed on 7/26, she has 5 refills NFN

## 2023-11-12 NOTE — Telephone Encounter (Signed)
 RX was send on 7/26 with 5 refills

## 2023-11-13 ENCOUNTER — Other Ambulatory Visit (HOSPITAL_COMMUNITY): Payer: Self-pay

## 2023-11-13 NOTE — Telephone Encounter (Signed)
 Spoke with patient for follow up after device clinic triage nurse reviewed report.  Advised to contact HF clinic if needed for any further follow up on her symptoms or schedule an office visit.  She appreciated call back and will do so if she has any other episodes

## 2023-11-14 ENCOUNTER — Telehealth (HOSPITAL_COMMUNITY): Payer: Self-pay

## 2023-11-14 ENCOUNTER — Other Ambulatory Visit (HOSPITAL_COMMUNITY): Payer: Self-pay

## 2023-11-15 ENCOUNTER — Other Ambulatory Visit: Payer: Self-pay | Admitting: Internal Medicine

## 2023-11-15 ENCOUNTER — Other Ambulatory Visit (HOSPITAL_COMMUNITY): Payer: Self-pay

## 2023-11-15 ENCOUNTER — Other Ambulatory Visit (HOSPITAL_BASED_OUTPATIENT_CLINIC_OR_DEPARTMENT_OTHER): Payer: Self-pay

## 2023-11-15 ENCOUNTER — Telehealth: Payer: Self-pay

## 2023-11-15 DIAGNOSIS — E119 Type 2 diabetes mellitus without complications: Secondary | ICD-10-CM

## 2023-11-15 MED ORDER — SITAGLIPTIN PHOSPHATE 50 MG PO TABS
50.0000 mg | ORAL_TABLET | Freq: Every day | ORAL | 1 refills | Status: DC
  Filled 2023-11-15: qty 90, 90d supply, fill #0
  Filled 2024-02-17: qty 90, 90d supply, fill #1

## 2023-11-15 NOTE — Telephone Encounter (Signed)
*  Pulm  Pharmacy Patient Advocate Encounter   Received notification from CoverMyMeds that prior authorization for Albuterol  Sulfate HFA 108 (90 Base)MCG/ACT aerosol  is required/requested.   Insurance verification completed.   The patient is insured through [facility] .   Per test claim:  Brand Ventolin  is preferred by the insurance.  If suggested medication is appropriate, Please send in a new RX and discontinue this one. If not, please advise as to why it's not appropriate so that we may request a Prior Authorization. Please note, some preferred medications may still require a PA.  If the suggested medications have not been trialed and there are no contraindications to their use, the PA will not be submitted, as it will not be approved.   *being filled for Brand Ventolin  @ [facility] $4.00

## 2023-11-18 ENCOUNTER — Other Ambulatory Visit (HOSPITAL_COMMUNITY): Payer: Self-pay

## 2023-11-18 ENCOUNTER — Other Ambulatory Visit: Payer: Self-pay

## 2023-11-18 ENCOUNTER — Other Ambulatory Visit: Payer: Self-pay | Admitting: Gastroenterology

## 2023-11-18 MED ORDER — PROMETHAZINE HCL 25 MG PO TABS
25.0000 mg | ORAL_TABLET | Freq: Four times a day (QID) | ORAL | 1 refills | Status: DC
  Filled 2023-11-18: qty 90, 23d supply, fill #0
  Filled 2023-12-12: qty 90, 23d supply, fill #1

## 2023-11-19 ENCOUNTER — Other Ambulatory Visit (HOSPITAL_COMMUNITY): Payer: Self-pay

## 2023-11-21 ENCOUNTER — Encounter: Payer: Self-pay | Admitting: Internal Medicine

## 2023-11-22 ENCOUNTER — Other Ambulatory Visit: Payer: Self-pay

## 2023-11-25 DIAGNOSIS — G4733 Obstructive sleep apnea (adult) (pediatric): Secondary | ICD-10-CM | POA: Diagnosis not present

## 2023-11-26 ENCOUNTER — Other Ambulatory Visit (HOSPITAL_COMMUNITY): Payer: Self-pay | Admitting: Internal Medicine

## 2023-11-26 ENCOUNTER — Other Ambulatory Visit (HOSPITAL_COMMUNITY): Payer: Self-pay

## 2023-11-26 DIAGNOSIS — Z419 Encounter for procedure for purposes other than remedying health state, unspecified: Secondary | ICD-10-CM | POA: Diagnosis not present

## 2023-11-26 MED ORDER — APIXABAN 5 MG PO TABS
5.0000 mg | ORAL_TABLET | Freq: Two times a day (BID) | ORAL | 11 refills | Status: AC
  Filled 2023-11-26: qty 60, 30d supply, fill #0
  Filled 2023-12-24: qty 60, 30d supply, fill #1
  Filled 2024-01-23: qty 60, 30d supply, fill #2
  Filled 2024-02-26: qty 60, 30d supply, fill #3
  Filled 2024-03-23: qty 60, 30d supply, fill #4
  Filled 2024-04-22: qty 60, 30d supply, fill #5

## 2023-11-28 ENCOUNTER — Ambulatory Visit: Admitting: Orthopedic Surgery

## 2023-11-28 ENCOUNTER — Other Ambulatory Visit (INDEPENDENT_AMBULATORY_CARE_PROVIDER_SITE_OTHER): Payer: Self-pay

## 2023-11-28 DIAGNOSIS — G8929 Other chronic pain: Secondary | ICD-10-CM

## 2023-11-28 DIAGNOSIS — M545 Low back pain, unspecified: Secondary | ICD-10-CM | POA: Diagnosis not present

## 2023-11-28 NOTE — Progress Notes (Signed)
 Orthopedic Spine Surgery Office Note  Assessment: Patient is a 58 y.o. female with adolescent idiopathic scoliosis.  Curvature measures 15 degrees.   Plan: -I told her that her curve is very consistent with an adolescent idiopathic scoliosis.  Her curvature measures 15 degrees.  It is unlikely to progress or cause any issues going forward -She does have some mild disc height loss at L5/S1 that may cause her issues in the future but she is asymptomatic now so I recommended no treatment at this time - Patient can return on an as-needed basis   Patient expressed understanding of the plan and all questions were answered to the patient's satisfaction.   ___________________________________________________________________________   History:  Patient is a 58 y.o. female who presents today for scoliosis.  Patient has had a number of health issues of late.  She noticed in some of her reports that there was mention of a scoliosis so she comes in today to have that evaluated and make sure that there is nothing she needs to do.  She sometimes has discomfort in her back but no pain.  She does not have any pain radiating to either lower extremity.  She feels the discomfort more in the lower region of her back.   Weakness: Denies Symptoms of imbalance: Denies Paresthesias and numbness: Denies Bowel or bladder incontinence: Has a history of urinary continence.  No recent changes.  No changes in bowel habits. Saddle anesthesia: Denies  Treatments tried: Tylenol   Review of systems: Denies fevers and chills, night sweats, unexplained weight loss, history of cancer, pain that wakes them at night  Past medical history: CAD OSA History of MI Crohn's disease Depression/anxiety CHF DM Insomnia Polycythemia Diverticulosis POTS  Allergies: gabapentin, erythromycin, tramadol , ibuprofen, adhesive tape  Past surgical history:  Tonsillectomy Hysterectomy ICD implantation Polypectomy Ovarian cyst  excision Cholecystectomy Cervical spine surgery  Social history: Denies use of nicotine  product (smoking, vaping, patches, smokeless) Alcohol use: Denies Denies recreational drug use   Physical Exam:  General: no acute distress, appears stated age Neurologic: alert, answering questions appropriately, following commands Respiratory: unlabored breathing on room air, symmetric chest rise Psychiatric: appropriate affect, normal cadence to speech   MSK (spine):  -Strength exam      Left  Right EHL    5/5  5/5 TA    5/5  5/5 GSC    5/5  5/5 Knee extension  5/5  5/5 Hip flexion   5/5  5/5  -Sensory exam    Sensation intact to light touch in L3-S1 nerve distributions of bilateral lower extremities  -Achilles DTR: 2/4 on the left, 2/4 on the right -Patellar tendon DTR: 2/4 on the left, 2/4 on the right -Clonus: no beats bilaterally  Imaging: XRs scoliosis from 11/28/2023 were independently reviewed and interpreted, showing apex to the right thoracic curvature that measures 15 degrees.  Mild disc height loss at L5/S1.  No other significant degenerative changes seen.  No spondylolisthesis seen.  No fracture or dislocation seen.   Patient name: Christine [name] Patient MRN: [id number] Date of visit: 11/28/23

## 2023-12-02 ENCOUNTER — Ambulatory Visit: Attending: Cardiology

## 2023-12-02 ENCOUNTER — Other Ambulatory Visit (HOSPITAL_COMMUNITY): Payer: Self-pay | Admitting: Internal Medicine

## 2023-12-02 ENCOUNTER — Other Ambulatory Visit: Payer: Self-pay

## 2023-12-02 DIAGNOSIS — Z95810 Presence of automatic (implantable) cardiac defibrillator: Secondary | ICD-10-CM

## 2023-12-02 DIAGNOSIS — I5042 Chronic combined systolic (congestive) and diastolic (congestive) heart failure: Secondary | ICD-10-CM

## 2023-12-03 ENCOUNTER — Other Ambulatory Visit (HOSPITAL_COMMUNITY): Payer: Self-pay

## 2023-12-03 MED ORDER — IVABRADINE HCL 5 MG PO TABS
2.5000 mg | ORAL_TABLET | Freq: Two times a day (BID) | ORAL | 5 refills | Status: AC
  Filled 2023-12-03: qty 30, 30d supply, fill #0
  Filled 2023-12-26: qty 30, 30d supply, fill #1
  Filled 2024-01-27: qty 30, 30d supply, fill #2
  Filled 2024-02-26: qty 30, 30d supply, fill #3
  Filled 2024-03-23: qty 30, 30d supply, fill #4
  Filled 2024-04-22: qty 30, 30d supply, fill #5

## 2023-12-05 ENCOUNTER — Inpatient Hospital Stay (HOSPITAL_COMMUNITY)
Admission: RE | Admit: 2023-12-05 | Discharge: 2023-12-05 | Disposition: A | Discharge status: Home / Self Care | Source: Ambulatory Visit | Attending: Gastroenterology

## 2023-12-05 DIAGNOSIS — K5000 Crohn's disease of small intestine without complications: Secondary | ICD-10-CM | POA: Diagnosis not present

## 2023-12-05 MED ORDER — VEDOLIZUMAB 300 MG IV SOLR
300.0000 mg | INTRAVENOUS | Status: DC
  Administered 2023-12-05: 300 mg via INTRAVENOUS
  Filled 2023-12-05: qty 5

## 2023-12-06 NOTE — Progress Notes (Signed)
 EPIC Encounter for ICM Monitoring  Patient Name: Christine [name] is a 58 y.o. female Date: 12/06/2023 Primary Care Physican: Vicci Barnie NOVAK, MD Primary Cardiologist: Kilmichael/Bensimhon Electrophysiologist: Kennyth 05/27/2023 Weight: 182 - 183 lbs (baseline)          06/24/2023 Weight: 181 lbs 06/26/2023 Weight: 188 lbs          08/02/2023 Weight: 184-185 lbs  09/13/2023 Weight: 184 lbs   (6 lbs less than hospitalization)     10/24/2023 Office Weight: 183 lbs 12/06/2023 Weight: 182 lbs                            Spoke with patient and heart failure questions reviewed.  Transmission results reviewed.  Pt asymptomatic for fluid accumulation.  Reports feeling well at this time and voices no complaints.     HeartLogic Heart Failure Index remains 4 suggesting fluid levels are within normal threshold range.      Prescribed:  Furosemide  20 mg take 2 tablet(s) (40 mg total) by mouth twice a day Potassium 20 mEq take 4 tablet(s) (80 mEq total) by mouth two (2) times a day Spironolactone  25 mg take 1 tablet by mouth daily.    Labs: 11/06/2023 Creatinine 1.06, BUN 13, Potassium 3.9, Sodium 138, GFR >60 09/03/2023 Creatinine 1.03, BUN 14, Potassium 4.1, Sodium 140  08/26/2023 Creatinine 1.08, BUN 15, Potassium 3.1, Sodium 136, GFR >60  08/25/2023 Creatinine 1.32, BUN 17, Potassium 4.9, Sodium 135, GFR 48  08/24/2023 Creatinine 1.31, BUN 25, Potassium 4.9, Sodium 137, GFR 48 A complete set of results can be found in Results Review.   Recommendations:  No changes and encouraged to call if experiencing any fluid symptoms.   Follow-up plan: ICM clinic phone appointment on 01/06/2024.   91 day device clinic remote transmission 01/22/2024.              EP/Cardiology next office visit:  Recall 02/04/2024 with Dr Cherrie.   Recall 03/15/2024 with Dr  Fernande.          Copy of ICM check sent to Dr. Kennyth.    3 Month HeartLogicT Heart Failure Index:    8 Day Data Trend:          [name],  RN 12/06/2023 8:05 AM

## 2023-12-10 ENCOUNTER — Other Ambulatory Visit (HOSPITAL_COMMUNITY): Payer: Self-pay

## 2023-12-12 ENCOUNTER — Other Ambulatory Visit: Payer: Self-pay

## 2023-12-12 ENCOUNTER — Other Ambulatory Visit (HOSPITAL_COMMUNITY): Payer: Self-pay

## 2023-12-15 ENCOUNTER — Encounter (HOSPITAL_BASED_OUTPATIENT_CLINIC_OR_DEPARTMENT_OTHER): Payer: Self-pay | Admitting: Emergency Medicine

## 2023-12-15 ENCOUNTER — Emergency Department (HOSPITAL_BASED_OUTPATIENT_CLINIC_OR_DEPARTMENT_OTHER)
Admission: EM | Admit: 2023-12-15 | Discharge: 2023-12-15 | Disposition: A | Discharge status: Home / Self Care | Attending: Emergency Medicine | Admitting: Emergency Medicine

## 2023-12-15 DIAGNOSIS — K529 Noninfective gastroenteritis and colitis, unspecified: Secondary | ICD-10-CM | POA: Insufficient documentation

## 2023-12-15 DIAGNOSIS — Z7901 Long term (current) use of anticoagulants: Secondary | ICD-10-CM | POA: Insufficient documentation

## 2023-12-15 DIAGNOSIS — I5042 Chronic combined systolic (congestive) and diastolic (congestive) heart failure: Secondary | ICD-10-CM | POA: Diagnosis not present

## 2023-12-15 DIAGNOSIS — R197 Diarrhea, unspecified: Secondary | ICD-10-CM | POA: Diagnosis present

## 2023-12-15 LAB — CBC WITH DIFFERENTIAL/PLATELET
Abs Immature Granulocytes: 0.02 K/uL (ref 0.00–0.07)
Basophils Absolute: 0 K/uL (ref 0.0–0.1)
Basophils Relative: 0 %
Eosinophils Absolute: 0.2 K/uL (ref 0.0–0.5)
Eosinophils Relative: 3 %
HCT: 41 % (ref 36.0–46.0)
Hemoglobin: 13.9 g/dL (ref 12.0–15.0)
Immature Granulocytes: 0 %
Lymphocytes Relative: 28 %
Lymphs Abs: 2 K/uL (ref 0.7–4.0)
MCH: 28.9 pg (ref 26.0–34.0)
MCHC: 33.9 g/dL (ref 30.0–36.0)
MCV: 85.2 fL (ref 80.0–100.0)
Monocytes Absolute: 0.4 K/uL (ref 0.1–1.0)
Monocytes Relative: 6 %
Neutro Abs: 4.6 K/uL (ref 1.7–7.7)
Neutrophils Relative %: 63 %
Platelets: 281 K/uL (ref 150–400)
RBC: 4.81 MIL/uL (ref 3.87–5.11)
RDW: 13.5 % (ref 11.5–15.5)
WBC: 7.2 K/uL (ref 4.0–10.5)
nRBC: 0 % (ref 0.0–0.2)

## 2023-12-15 LAB — COMPREHENSIVE METABOLIC PANEL WITH GFR
ALT: 23 U/L (ref 0–44)
AST: 23 U/L (ref 15–41)
Albumin: 4.3 g/dL (ref 3.5–5.0)
Alkaline Phosphatase: 131 U/L — ABNORMAL HIGH (ref 38–126)
Anion gap: 13 (ref 5–15)
BUN: 13 mg/dL (ref 6–20)
CO2: 22 mmol/L (ref 22–32)
Calcium: 9.5 mg/dL (ref 8.9–10.3)
Chloride: 107 mmol/L (ref 98–111)
Creatinine, Ser: 0.96 mg/dL (ref 0.44–1.00)
GFR, Estimated: >60 mL/min (ref >60)
Glucose, Bld: 101 mg/dL — ABNORMAL HIGH (ref 70–99)
Potassium: 3.7 mmol/L (ref 3.5–5.1)
Sodium: 142 mmol/L (ref 135–145)
Total Bilirubin: 0.5 mg/dL (ref 0.0–1.2)
Total Protein: 7 g/dL (ref 6.5–8.1)

## 2023-12-15 LAB — LIPASE, BLOOD: Lipase: 31 U/L (ref 11–51)

## 2023-12-15 MED ORDER — SODIUM CHLORIDE 0.9 % IV BOLUS
500.0000 mL | Freq: Once | INTRAVENOUS | Status: AC
  Administered 2023-12-15: 500 mL via INTRAVENOUS

## 2023-12-15 NOTE — ED Notes (Signed)
 IV attempt right AC, Right Forearm... Both unsuccessful... Butterfly right hand for the blood work.SABRASABRA

## 2023-12-15 NOTE — ED Triage Notes (Signed)
 C/o dizziness, diarrhea and lower abd pain x 2 days. States hypotension at home. (80s/40s).

## 2023-12-15 NOTE — ED Provider Notes (Signed)
 [facility] AT [facility] Provider Note   CSN: [id number] Arrival date & time: 12/15/23  9058     Patient presents with: Diarrhea   Christine [name] is a 58 y.o. 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  Reports she had about 6 episodes of nonbloody diarrhea yesterday and a couple episodes of nonbloody nonbilious emesis.  Today, she has not had any further episodes of vomiting or diarrhea.  She has returned to having normal stools.  She denies any abdominal pain, fevers, chills.  Denies any abnormal food intake, any sick contacts.  Has not been in the hospital recently and has not taken any antibiotics recently.  She states episodes yesterday did not seem like her typical Crohn's.  She also reports concern for having a lower blood pressure reading at home at 90/30.  She reports she normally runs at about 90-100/60.    Diarrhea      Prior to Admission medications   Medication Sig Start Date End Date Taking? Authorizing Provider  Accu-Chek Softclix Lancets lancets Use as instructed.  Check blood sugar daily before breakfast. 11/02/22   Vicci Barnie NOVAK, MD  acetaminophen  (TYLENOL ) 500 MG tablet Take 1,000 mg by mouth 3 (three) times daily as needed for moderate pain or headache.    [provider]  albuterol  (VENTOLIN  HFA) 108 (90 Base) MCG/ACT inhaler Inhale 2 puffs into the lungs every 6 (six) hours as needed for wheezing or shortness of breath. 11/11/23   Hope Almarie ORN, NP  ALPRAZolam  (XANAX ) 1 MG tablet Take 2 mg by mouth 3 (three) times daily.    [provider]  alum & mag hydroxide-simeth (MAALOX/MYLANTA) 200-200-20 MG/5ML suspension Take 30 mLs by mouth every 4 (four) hours as needed for indigestion or heartburn. 03/02/22   Atway, Rayann N, DO  apixaban  (ELIQUIS ) 5 MG TABS  tablet Take 1 tablet (5 mg total) by mouth 2 (two) times daily. 11/26/23   Bensimhon, Toribio SAUNDERS, MD  atorvastatin  (LIPITOR ) 80 MG tablet Take 1 tablet (80 mg total) by mouth daily. 09/03/23   Vicci Barnie NOVAK, MD  Blood Glucose Monitoring Suppl (ACCU-CHEK GUIDE) w/Device KIT Check blood sugar once daily before breakfast 11/02/22   Vicci Barnie NOVAK, MD  buPROPion  (WELLBUTRIN  XL) 150 MG 24 hr tablet Take 150 mg by mouth daily.    [provider]  cetirizine  (ZYRTEC  ALLERGY ) 10 MG tablet Take 1 tablet (10 mg total) by mouth 2 (two) times daily. 10/23/23   Luke Orlan HERO, DO  cyclobenzaprine  (FLEXERIL ) 10 MG tablet Take 10 mg by mouth 3 (three) times daily as needed for muscle spasms.    [provider]  dapagliflozin  propanediol (FARXIGA ) 10 MG TABS tablet Take 1 tablet (10 mg total) by mouth daily before breakfast. 09/06/23   Clegg, Amy D, NP  dicyclomine  (BENTYL ) 10 MG capsule Take 1 capsule (10 mg total) by mouth every 6 (six) hours as needed for spasms. 09/16/23   Cirigliano, Vito V, DO  diphenoxylate -atropine  (LOMOTIL ) 2.5-0.025 MG tablet Take 2 tablets by mouth 4 (four) times daily as needed for diarrhea/ loose stools. 08/05/23     Eszopiclone  3 MG TABS Take 1 tablet (3 mg total) by mouth at bedtime. Take immediately before bedtime 11/07/23   Hope Almarie ORN, NP  famotidine  (PEPCID ) 20 MG tablet Take 1 tablet (20 mg  total) by mouth 2 (two) times daily. 10/23/23   Luke Orlan HERO, DO  feeding supplement (ENSURE ENLIVE / ENSURE PLUS) LIQD Take 237 mLs by mouth 2 (two) times daily between meals. 03/03/22   Atway, Rayann N, DO  Fezolinetant  (VEOZAH ) 45 MG TABS Take 1 tablet (45 mg total) by mouth daily. 10/30/23   Vicci Barnie NOVAK, MD  fluticasone  (FLONASE ) 50 MCG/ACT nasal spray Place 1-2 sprays into both nostrils daily as needed (nasal congestion). 06/24/23   Luke Orlan HERO, DO  furosemide  (LASIX ) 20 MG tablet Take 2 tablets (40 mg total) by mouth 2 (two) times daily. 09/02/23   Rolan Ezra RAMAN, MD   glucose blood test strip Use as instructed to check blood sugar daily before breakfast 06/28/23   Vicci Barnie NOVAK, MD  ivabradine  (CORLANOR ) 5 MG TABS tablet Take 1/2 tablet (2.5 mg total) by mouth 2 (two) times daily. 12/03/23   Hayes Beckey CROME, NP  lipase/protease/amylase (CREON ) 36000 UNITS CPEP capsule Take 1 capsule (36,000 Units total) by mouth in the morning, at noon, and at bedtime. 07/25/23   Vicci Barnie NOVAK, MD  metroNIDAZOLE  (METROGEL ) 1 % gel Apply 1 Application topically daily as needed (rash).    [provider]  Multiple Vitamins-Minerals (MULTIVITAMIN WITH MINERALS) tablet Take 1 tablet by mouth daily.    [provider]  oxymetazoline (AFRIN) 0.05 % nasal spray Place 1-2 sprays into both nostrils 2 (two) times daily as needed for congestion.    [provider]  potassium chloride  SA (KLOR-CON  M) 20 MEQ tablet Take 4 tablets (80 mEq total) by mouth 2 (two) times daily. 09/02/23   Rolan Ezra RAMAN, MD  predniSONE  (DELTASONE ) 20 MG tablet Take 1 tablet by mouth daily x 3 days then 1/2 tablet daily x 4 days 09/03/23   Vicci Barnie NOVAK, MD  promethazine  (PHENERGAN ) 25 MG tablet Take 1 tablet (25 mg total) by mouth every 6 (six) to 8 (eight) hours for nausea or vomiting. 11/18/23   Cirigliano, Vito V, DO  sitaGLIPtin  (JANUVIA ) 50 MG tablet Take 1 tablet (50 mg total) by mouth daily. 11/15/23   Vicci Barnie NOVAK, MD  triamcinolone  cream (KENALOG ) 0.1 % Apply 1 Application topically daily as needed (irritation).    [provider]  valACYclovir  (VALTREX ) 500 MG tablet Take 1 tablet (500 mg total) by mouth 2 (two) times daily. 05/20/23   Vicci Barnie NOVAK, MD  vedolizumab  (ENTYVIO ) 300 MG injection Inject 300 mg into the vein every 2 (two) months.    [provider]  Vitamin D , Ergocalciferol , (DRISDOL ) 1.25 MG (50000 UNIT) CAPS capsule Take 1 capsule (50,000 Units total) by mouth every 7 (seven) days. 10/11/23   Vicci Barnie NOVAK, MD    Allergies:  Gabapentin, Sulfa antibiotics, Erythromycin, Tramadol , Ibuprofen, and Tape    Review of Systems  Gastrointestinal:  Positive for diarrhea.    Updated Vital Signs BP 100/63 (BP Location: Right Arm)   Pulse 61   Temp 98.3 F (36.8 C) (Oral)   Resp 16   SpO2 96%   Physical Exam Vitals and nursing note reviewed.  Constitutional:      General: She is not in acute distress.    Appearance: She is well-developed.     Comments: No vomiting  HENT:     Head: Normocephalic and atraumatic.  Eyes:     Conjunctiva/sclera: Conjunctivae normal.  Cardiovascular:     Rate and Rhythm: Normal rate and regular rhythm.     Heart sounds: No  murmur heard. Pulmonary:     Effort: Pulmonary effort is normal. No respiratory distress.     Breath sounds: Normal breath sounds.  Abdominal:     Palpations: Abdomen is soft.     Tenderness: There is no abdominal tenderness.     Comments: Abdomen is soft and nontender without rebound or guarding  Musculoskeletal:        General: No swelling.     Cervical back: Neck supple.  Skin:    General: Skin is warm and dry.     Capillary Refill: Capillary refill takes less than 2 seconds.  Neurological:     Mental Status: She is alert.  Psychiatric:        Mood and Affect: Mood normal.     (all labs ordered are listed, but only abnormal results are displayed) Labs Reviewed  COMPREHENSIVE METABOLIC PANEL WITH GFR - Abnormal; Notable for the following components:      Result Value   Glucose, Bld 101 (*)    Alkaline Phosphatase 131 (*)    All other components within normal limits  CBC WITH DIFFERENTIAL/PLATELET  LIPASE, BLOOD    EKG: None  Radiology: No results found.   Procedures   Medications Ordered in the ED  sodium chloride  0.9 % bolus 500 mL (0 mLs Intravenous Stopped 12/15/23 1352)                                    Medical Decision Making Amount and/or Complexity of Data Reviewed Labs: ordered.     Differential diagnosis includes  but is not limited to acute cholecystitis, cholelithiasis, cholangitis, choledocholithiasis, peptic ulcer, gastritis, gastroenteritis, appendicitis, IBS, IBD, DKA, nephrolithiasis, UTI, pyelonephritis, pancreatitis, diverticulitis, mesenteric ischemia, abdominal aortic aneurysm, small bowel obstruction, volvulus, ACS   ED Course:  Upon initial evaluation, patient is very well-appearing, no acute distress.  Stable vitals upon arrival.  Blood pressure within normal range here at 112/67.  Abdomen is soft nontender.  She does not have any active vomiting.  Reports she has not had any diarrhea today.  Labs Ordered: I Ordered, and personally interpreted labs.  The pertinent results include:   CBC within normal limits CMP with mildly elevated alk phos at 131, otherwise within normal limits Lipase within normal limits   Medications Given: 500ml NS given over 2 hours  Upon re-evaluation, patient remains well appearing with stable vitals. Has not had any hypotensive episodes here that she reported at home.  Labs are reassuring without any leukocytosis, no elevation in LFTs or creatinine, or lipase.  Her abdomen is soft and nontender.  Given reassuring labs, vitals, and abdomen without any tenderness, I have low concern for acute intra-abdominal pathology at this time.  Diarrhea was nonbloody, no fevers, no recent travel, low concern for infectious source of diarrhea that would require any antibiotic treatment. Lower concern for Chron's as there has been no abdominal pain, no bloody diarrhea, and symptoms resolved on their own.  She has not had any recent hospitalizations, no recent antibiotic use, diarrhea has resolved on its own, low concern for C. difficile. She has resumed having normal stools today, no vomiting today, low concern for SBO.  She is tolerating p.o. intake.  Stable and appropriate for discharge home   Impression: Gastroenteritis  Disposition:  The patient was discharged home with  instructions to a bland diet and advance diet as tolerated. Return precautions given.    This chart  was dictated using voice recognition software, Dragon. Despite the best efforts of this provider to proofread and correct errors, errors may still occur which can change documentation meaning.       Final diagnoses:  Gastroenteritis    ED Discharge Orders     None          Veta Palma, DEVONNA 12/15/23 1549    Yolande Lamar BROCKS, MD 12/17/23 [phone]

## 2023-12-15 NOTE — Discharge Instructions (Addendum)
 You likely had a stomach bug (gastroenteritis) that caused her vomiting and diarrhea.    Your workup is reassuring today.  Your kidney, liver, and pancreas labs are normal.  Your blood counts and electrolytes were normal today.  You were given some fluids today through your IV.  Please keep well hydrated at home in compliance with the recommendations of your heart failure doctor.  Eat a bland diet, then advance your diet as tolerated.  Please follow-up with your PCP as needed  Return to the ER for any severe abdominal pains, persistent vomiting, fevers, if you no longer passing any stool, any other new or concerning symptoms

## 2023-12-15 NOTE — ED Notes (Signed)
 Per family request, a [employer] was provided at the family at bedside.

## 2023-12-16 ENCOUNTER — Other Ambulatory Visit (HOSPITAL_COMMUNITY): Payer: Self-pay | Admitting: Cardiology

## 2023-12-17 ENCOUNTER — Other Ambulatory Visit (HOSPITAL_COMMUNITY): Payer: Self-pay

## 2023-12-17 MED ORDER — FUROSEMIDE 20 MG PO TABS
40.0000 mg | ORAL_TABLET | Freq: Two times a day (BID) | ORAL | 3 refills | Status: DC
  Filled 2023-12-17: qty 120, 30d supply, fill #0
  Filled 2024-01-07 – 2024-01-08 (×2): qty 120, 30d supply, fill #1
  Filled 2024-01-30: qty 120, 30d supply, fill #2
  Filled 2024-02-21: qty 120, 30d supply, fill #3

## 2023-12-18 ENCOUNTER — Telehealth: Payer: Self-pay

## 2023-12-18 ENCOUNTER — Other Ambulatory Visit (HOSPITAL_COMMUNITY): Payer: Self-pay

## 2023-12-18 ENCOUNTER — Other Ambulatory Visit: Payer: Self-pay

## 2023-12-18 NOTE — Telephone Encounter (Signed)
 Being filled through [facility] pharmacies and processed for a $4.00 copay

## 2023-12-18 NOTE — Telephone Encounter (Signed)
*  Pulm  Pharmacy Patient Advocate Encounter   Received notification from Pt Calls Messages that prior authorization for Eszopiclone  is required/requested.   Insurance verification completed.   The patient is insured through [employer] .   Per test claim: PA required; PA submitted to above mentioned insurance via Latent Key/confirmation #/EOC [facility] Status is pending

## 2023-12-18 NOTE — Telephone Encounter (Signed)
 Approved. This drug has been approved. Approved quantity: 30 tablets per 30 day(s). You may fill up to a 34 day supply at a retail pharmacy. You may fill up to a 90 day supply for maintenance drugs, please refer to the formulary for details. Please call the pharmacy to process your prescription claim. Authorization Expiration03/05/2024

## 2023-12-18 NOTE — Telephone Encounter (Signed)
 Pt called in stating that she went to the ED and she was told she is not supposed to go through metal detectors and they made he go through it. Pt would like a call back to let her know if this was okay to do as she was told it was not

## 2023-12-19 ENCOUNTER — Other Ambulatory Visit (HOSPITAL_COMMUNITY): Payer: Self-pay

## 2023-12-19 NOTE — Telephone Encounter (Signed)
 Patient advised to contact [employer] to inquire further. Patient voiced understanding and appreciative of call back.

## 2023-12-23 ENCOUNTER — Other Ambulatory Visit (HOSPITAL_COMMUNITY): Payer: Self-pay

## 2023-12-23 ENCOUNTER — Ambulatory Visit: Attending: Cardiovascular Disease | Admitting: Pharmacist Clinician (PhC)/ Clinical Pharmacy Specialist

## 2023-12-23 ENCOUNTER — Telehealth: Payer: Self-pay | Admitting: Pharmacist Clinician (PhC)/ Clinical Pharmacy Specialist

## 2023-12-23 ENCOUNTER — Telehealth: Payer: Self-pay | Admitting: Pharmacy Technician

## 2023-12-23 ENCOUNTER — Encounter: Payer: Self-pay | Admitting: Pharmacist Clinician (PhC)/ Clinical Pharmacy Specialist

## 2023-12-23 DIAGNOSIS — E785 Hyperlipidemia, unspecified: Secondary | ICD-10-CM | POA: Insufficient documentation

## 2023-12-23 NOTE — Progress Notes (Signed)
 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 female patient of Dr Cherrie, in the office today to discuss options for cholesterol management.  Insurance Carrier: [employer] Managed Medicaid  Pharmacy:   [facility]  LDL Cholesterol goal:  LDL < 70  Current Medications:  atorvastatin  80 mg daily,    Family Hx:   father had CHF, died at 69, mother had AF; oldest brother with prolonged Qtc;   Social Hx: Tobacco: quit smoking about 6 days before her MI Alcohol: no   Diet:    mostly home cooked meals, reads labels, cooks from scratch; protein is more chicken and occasional beef; lots of salads, protein drinks  Exercise: gym 3-4 days per week  Lab Results  Component Value Date   CHOL 174 10/24/2023   HDL 42 10/24/2023   LDLCALC 112 (H) 10/24/2023   TRIG 111 10/24/2023   CHOLHDL 4.1 10/24/2023    No results found for: LIPOA  Lab Results  Component Value Date   ALT 23 12/15/2023   AST 23 12/15/2023   ALKPHOS 131 (H) 12/15/2023   BILITOT 0.5 12/15/2023   Lab Results  Component Value Date   CREATININE 0.96 12/15/2023   BUN 13 12/15/2023   NA 142 12/15/2023   K 3.7 12/15/2023   CL 107 12/15/2023   CO2 22 12/15/2023   Lab Results  Component Value Date   HGBA1C 5.5 09/03/2023     Home Medications    Current Outpatient Medications  Medication Sig Dispense Refill   Accu-Chek Softclix Lancets lancets Use as instructed.  Check blood sugar daily before breakfast. 100 each 12   acetaminophen  (TYLENOL ) 500 MG tablet Take 1,000 mg by mouth 3 (three) times daily as needed for moderate pain or headache.     albuterol  (VENTOLIN  HFA) 108 (90 Base) MCG/ACT inhaler Inhale 2 puffs into the lungs every 6 (six) hours as needed for wheezing or shortness of breath. 18 g 1   ALPRAZolam  (XANAX ) 1 MG tablet Take 2 mg by mouth 3 (three) times daily.     alum & mag hydroxide-simeth (MAALOX/MYLANTA) 200-200-20 MG/5ML suspension Take 30 mLs by mouth every 4 (four) hours as needed for indigestion or heartburn. 355 mL 0   apixaban  (ELIQUIS ) 5 MG TABS tablet Take 1 tablet (5 mg total) by mouth 2 (two) times daily. 60 tablet 11   atorvastatin  (LIPITOR ) 80 MG tablet Take 1 tablet (80 mg total) by mouth daily. 90 tablet 3   Blood Glucose Monitoring Suppl (ACCU-CHEK GUIDE) w/Device KIT Check blood sugar once daily before breakfast 1 kit 0   buPROPion  (WELLBUTRIN  XL) 150 MG 24 hr tablet Take 150 mg by mouth daily.  cetirizine  (ZYRTEC  ALLERGY ) 10 MG tablet Take 1 tablet (10 mg total) by mouth 2 (two) times daily. 60 tablet 5   cyclobenzaprine  (FLEXERIL ) 10 MG tablet Take 10 mg by mouth 3 (three) times daily as needed for muscle spasms.     dapagliflozin  propanediol (FARXIGA ) 10 MG TABS tablet Take 1 tablet (10 mg total) by mouth daily before breakfast. 90 tablet 3   dicyclomine  (BENTYL ) 10 MG capsule Take 1 capsule (10 mg total) by mouth every 6 (six) hours as needed for spasms. 60 capsule 3   diphenoxylate -atropine  (LOMOTIL ) 2.5-0.025 MG tablet Take 2 tablets by mouth 4 (four) times daily as needed for diarrhea/ loose stools. 60 tablet 3   Eszopiclone  3 MG TABS Take 1 tablet (3 mg total) by mouth at bedtime. Take immediately before bedtime 30 tablet 5   famotidine  (PEPCID ) 20 MG tablet Take 1  tablet (20 mg total) by mouth 2 (two) times daily. 60 tablet 5   feeding supplement (ENSURE ENLIVE / ENSURE PLUS) LIQD Take 237 mLs by mouth 2 (two) times daily between meals. 237 mL 12   Fezolinetant  (VEOZAH ) 45 MG TABS Take 1 tablet (45 mg total) by mouth daily. 30 tablet 6   fluticasone  (FLONASE ) 50 MCG/ACT nasal spray Place 1-2 sprays into both nostrils daily as needed (nasal congestion). 16 g 5   furosemide  (LASIX ) 20 MG tablet Take 2 tablets (40 mg total) by mouth 2 (two) times daily. 120 tablet 3   glucose blood test strip Use as instructed to check blood sugar daily before breakfast 100 each 6   ivabradine  (CORLANOR ) 5 MG TABS tablet Take 1/2 tablet (2.5 mg total) by mouth 2 (two) times daily. 30 tablet 5   lipase/protease/amylase (CREON ) 36000 UNITS CPEP capsule Take 1 capsule (36,000 Units total) by mouth in the morning, at noon, and at bedtime. 270 capsule 1   metroNIDAZOLE  (METROGEL ) 1 % gel Apply 1 Application topically daily as needed (rash).     Multiple Vitamins-Minerals (MULTIVITAMIN WITH MINERALS) tablet Take 1 tablet by mouth daily.     oxymetazoline (AFRIN) 0.05 % nasal spray Place 1-2 sprays into both nostrils 2 (two) times daily as needed for congestion.     potassium chloride  SA (KLOR-CON  M) 20 MEQ tablet Take 4 tablets (80 mEq total) by mouth 2 (two) times daily. 240 tablet 3   predniSONE  (DELTASONE ) 20 MG tablet Take 1 tablet by mouth daily x 3 days then 1/2 tablet daily x 4 days 5 tablet 0   promethazine  (PHENERGAN ) 25 MG tablet Take 1 tablet (25 mg total) by mouth every 6 (six) to 8 (eight) hours for nausea or vomiting. 90 tablet 1   sitaGLIPtin  (JANUVIA ) 50 MG tablet Take 1 tablet (50 mg total) by mouth daily. 90 tablet 1   triamcinolone  cream (KENALOG ) 0.1 % Apply 1 Application topically daily as needed (irritation).     valACYclovir  (VALTREX ) 500 MG tablet Take 1 tablet (500 mg total) by mouth 2 (two) times daily. 6 tablet 2   vedolizumab  (ENTYVIO ) 300 MG injection  Inject 300 mg into the vein every 2 (two) months.     Vitamin D , Ergocalciferol , (DRISDOL ) 1.25 MG (50000 UNIT) CAPS capsule Take 1 capsule (50,000 Units total) by mouth every 7 (seven) days. 12 capsule 1   No current facility-administered medications for this visit.     Assessment & Plan    Hyperlipidemia LDL goal <55 Assessment: Patient with ASCVD not at LDL goal of < 55 Most recent LDL 112  on 10/24/23 Has been compliant with high intensity statin : atorvastatin  80 mg daily Not able to tolerate statins secondary to myalgias Reviewed options for lowering LDL cholesterol, including PCSK-9 inhibitors and inclisiran.  Discussed mechanisms of action, dosing, side effects, potential decreases in LDL cholesterol and costs.  Also reviewed potential options for patient assistance.  Plan: Patient agreeable to starting Repatha  140 mg q14d Continue with atorvastatin  80 mg daily Repeat labs after:  3 months Lipid Liver function   Allean Mink, PharmD CPP [facility] [address]   [place], [place] [address] [phone]  12/23/2023, 2:14 PM

## 2023-12-23 NOTE — Assessment & Plan Note (Signed)
 Assessment: Patient with ASCVD not at LDL goal of < 55 Most recent LDL 112 on 10/24/23 Has been compliant with high intensity statin : atorvastatin  80 mg daily Not able to tolerate statins secondary to myalgias Reviewed options for lowering LDL cholesterol, including PCSK-9 inhibitors and inclisiran.  Discussed mechanisms of action, dosing, side effects, potential decreases in LDL cholesterol and costs.  Also reviewed potential options for patient assistance.  Plan: Patient agreeable to starting Repatha  140 mg q14d Continue with atorvastatin  80 mg daily Repeat labs after:  3 months Lipid Liver function

## 2023-12-23 NOTE — Patient Instructions (Signed)
 Your Results:             Your most recent labs Goal  Total Cholesterol 174 < 200  Triglycerides 111 < 150  HDL (happy/good cholesterol) 42 > 40  LDL (lousy/bad cholesterol 112 < 70   Medication changes:  We will start the process to get Repatha  covered by your insurance.  Once the prior authorization is complete, I will call/send a MyChart message to let you know and confirm pharmacy information.   You will take one injection every 14 days  Lab orders:  We want to repeat labs after 2-3 months.  We will send you a lab order to remind you once we get closer to that time.     Thank you for choosing [facility]

## 2023-12-23 NOTE — Telephone Encounter (Signed)
 Pharmacy Patient Advocate Encounter  Received notification from [employer] that Prior Authorization for repatha  has been APPROVED from 12/23/23 to 12/22/24. Ran test claim, Copay is $4.00. This test claim was processed through [facility]- copay amounts may vary at other pharmacies due to pharmacy/plan contracts, or as the patient moves through the different stages of their insurance plan.   PA #/Case ID/Reference #: [id number]

## 2023-12-23 NOTE — Telephone Encounter (Signed)
 Per pt calls   Pharmacy Patient Advocate Encounter   Received notification from Pt Calls Messages that prior authorization for REPATHA  is required/requested.   Insurance verification completed.   The patient is insured through [facility] MEDICAID .   Per test claim: PA required; PA submitted to above mentioned insurance via Latent Key/confirmation #/EOC [id number] Status is pending

## 2023-12-23 NOTE — Telephone Encounter (Signed)
 Please do PA for Repatha

## 2023-12-24 ENCOUNTER — Other Ambulatory Visit: Payer: Self-pay

## 2023-12-24 ENCOUNTER — Encounter: Payer: Self-pay | Admitting: Pharmacist Clinician (PhC)/ Clinical Pharmacy Specialist

## 2023-12-24 ENCOUNTER — Other Ambulatory Visit (HOSPITAL_COMMUNITY): Payer: Self-pay

## 2023-12-24 DIAGNOSIS — E785 Hyperlipidemia, unspecified: Secondary | ICD-10-CM

## 2023-12-24 MED ORDER — REPATHA SURECLICK 140 MG/ML ~~LOC~~ SOAJ
140.0000 mg | SUBCUTANEOUS | 3 refills | Status: AC
  Filled 2023-12-24: qty 6, 84d supply, fill #0
  Filled 2024-04-02: qty 6, 84d supply, fill #1

## 2023-12-24 NOTE — Addendum Note (Signed)
 Addended by: Ermie Glendenning L on: 12/24/2023 11:08 AM   Modules accepted: Orders

## 2023-12-25 ENCOUNTER — Ambulatory Visit: Admitting: Allergy

## 2023-12-25 ENCOUNTER — Other Ambulatory Visit (HOSPITAL_COMMUNITY): Payer: Self-pay

## 2023-12-25 ENCOUNTER — Other Ambulatory Visit: Payer: Self-pay

## 2023-12-25 ENCOUNTER — Encounter: Payer: Self-pay | Admitting: Allergy

## 2023-12-25 VITALS — BP 94/68 | HR 90 | Temp 98.1°F | Resp 16 | Ht 70.25 in | Wt 177.0 lb

## 2023-12-25 DIAGNOSIS — J3081 Allergic rhinitis due to animal (cat) (dog) hair and dander: Secondary | ICD-10-CM | POA: Diagnosis not present

## 2023-12-25 DIAGNOSIS — L282 Other prurigo: Secondary | ICD-10-CM

## 2023-12-25 DIAGNOSIS — J3089 Other allergic rhinitis: Secondary | ICD-10-CM | POA: Diagnosis not present

## 2023-12-25 DIAGNOSIS — J301 Allergic rhinitis due to pollen: Secondary | ICD-10-CM

## 2023-12-25 DIAGNOSIS — R748 Abnormal levels of other serum enzymes: Secondary | ICD-10-CM | POA: Diagnosis not present

## 2023-12-25 MED ORDER — CETIRIZINE HCL 10 MG PO TABS
10.0000 mg | ORAL_TABLET | Freq: Two times a day (BID) | ORAL | 5 refills | Status: AC
  Filled 2023-12-25: qty 53, 27d supply, fill #0
  Filled 2023-12-25: qty 7, 3d supply, fill #0
  Filled 2024-02-04: qty 60, 30d supply, fill #1
  Filled 2024-03-03 – 2024-03-06 (×2): qty 60, 30d supply, fill #2
  Filled 2024-04-02: qty 60, 30d supply, fill #3
  Filled 2024-05-13: qty 60, 30d supply, fill #4

## 2023-12-25 MED ORDER — FAMOTIDINE 20 MG PO TABS
20.0000 mg | ORAL_TABLET | Freq: Two times a day (BID) | ORAL | 5 refills | Status: AC
  Filled 2023-12-25: qty 60, 30d supply, fill #0
  Filled 2024-02-04: qty 60, 30d supply, fill #1
  Filled 2024-03-03 – 2024-03-06 (×2): qty 60, 30d supply, fill #2
  Filled 2024-04-02: qty 60, 30d supply, fill #3
  Filled 2024-04-22 – 2024-04-24 (×2): qty 60, 30d supply, fill #4

## 2023-12-25 NOTE — Progress Notes (Signed)
 Follow Up Note  RE: Christine [name] MRN: [id number] DOB: October 16, 1965 Date of Office Visit: 12/25/2023  Referring provider: Vicci Barnie NOVAK, MD Primary care provider: Vicci Barnie NOVAK, MD  Chief Complaint: Follow-up and Allergic Rhinitis  (She needs medication refill)  History of Present Illness: I had the pleasure of seeing Christine [name] for a [facility]  on 12/25/2023. She is a 58 y.o. female, who is being followed for pruritic rash and allergic rhinitis. Her previous allergy  office visit was on 06/24/2023 with Dr. Luke. Today is a regular follow up visit.  Discussed the use of AI scribe software for clinical note transcription with the patient, who gave verbal consent to proceed.    She experiences persistent itching, particularly after showering, which is controlled with Zyrtec  and famotidine  taken twice daily. Reducing the medication to once daily results in the return of itching. No new rashes or breakouts are noted. She uses lotion post-shower to alleviate symptoms.  She has a history of low blood pressure, which led to hospitalization in May due to dehydration. Her Lasix  dosage was reduced by half, which has helped manage her blood pressure. She experiences dizziness and lightheadedness when her blood pressure drops to the 80s/50s range.  Her past medical history includes heart failure, Crohn's disease, a blood clot in the heart, exocrine pancreatic insufficiency, and diabetes. She reports improvement in her Crohn's symptoms with Entyvio , which she has been on for over a year. Her stomach is doing better.  She experiences chills and hot flashes, even in warm weather, and is on Veozah  for hot flashes. She is scheduled to see an endocrinologist on October 1st for further evaluation of these symptoms.  She uses Flonase  seasonally for allergies.     Assessment and Plan: Christine [name] is a 58 y.o. female with: Pruritic rash Elevated serum  tryptase Past history - daily pruritic rash on extremities and neck, not torso. Previous biopsy showed Jesner's lymphocytic infiltrates. Current treatment with loratadine  and triamcinolone  cream provides minimal relief. No clear triggers identified. Gabapentin caused diarrhea. Medical history significant for DM, CHF, CAD, Crohn's. 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 2024 bloodwork - CU, alpha gal, food panel normal; tryptase 12.4, Interim history - asymptomatic as long as she takes zyrtec  and famotidine  BID. Unable to wean down.  Keep track of rashes and take pictures. Write down what you had done/eaten during flares.  May use benadryl cream as needed for itching.  Continue proper skin care. Continue zyrtec  (cetirizine ) 10mg  twice a day. If symptoms are not controlled or causes drowsiness let us  know. Continue Pepcid  (famotidine ) 20mg  twice a day.  Avoid the following potential triggers: alcohol, tight clothing, NSAIDs, hot showers and getting overheated. Continue proper skin care.  Your tryptase was 12.4. Will repeat tryptase and check a c-kit marker due to your other co-morbid conditions. Consider ruling out hereditary alpha tryptasemia which is a genetic test but it's an out of pocket expense - deferred for now.   Allergic rhinitis due to dust mite Allergic rhinitis due to animal dander Seasonal allergic rhinitis due to pollen Past history - Symptomatic in the spring. 2024 bloodwork positive to dust mites and borderline to cat and grass. Interim history - stable.  Continue environmental control measures. Use Flonase  (fluticasone ) nasal spray 1-2 sprays per nostril once a day as needed for nasal congestion.  Nasal saline spray (i.e., Simply Saline) or nasal saline lavage (i.e., NeilMed) is  recommended as needed and prior to medicated nasal sprays.  Return in about 6 months (around 06/23/2024).  Meds ordered this encounter  Medications    cetirizine  (ZYRTEC  ALLERGY ) 10 MG tablet    Sig: Take 1 tablet (10 mg total) by mouth 2 (two) times daily.    Dispense:  60 tablet    Refill:  5   famotidine  (PEPCID ) 20 MG tablet    Sig: Take 1 tablet (20 mg total) by mouth 2 (two) times daily.    Dispense:  60 tablet    Refill:  5   Lab Orders         Tryptase         KIT (D816V) Digital PCR      Diagnostics: None.   Medication List:  Current Outpatient Medications  Medication Sig Dispense Refill   Accu-Chek Softclix Lancets lancets Use as instructed.  Check blood sugar daily before breakfast. 100 each 12   acetaminophen  (TYLENOL ) 500 MG tablet Take 1,000 mg by mouth 3 (three) times daily as needed for moderate pain or headache.     albuterol  (VENTOLIN  HFA) 108 (90 Base) MCG/ACT inhaler Inhale 2 puffs into the lungs every 6 (six) hours as needed for wheezing or shortness of breath. 18 g 1   ALPRAZolam  (XANAX ) 1 MG tablet Take 2 mg by mouth 3 (three) times daily.     alum & mag hydroxide-simeth (MAALOX/MYLANTA) 200-200-20 MG/5ML suspension Take 30 mLs by mouth every 4 (four) hours as needed for indigestion or heartburn. 355 mL 0   apixaban  (ELIQUIS ) 5 MG TABS tablet Take 1 tablet (5 mg total) by mouth 2 (two) times daily. 60 tablet 11   atorvastatin  (LIPITOR ) 80 MG tablet Take 1 tablet (80 mg total) by mouth daily. 90 tablet 3   Blood Glucose Monitoring Suppl (ACCU-CHEK GUIDE) w/Device KIT Check blood sugar once daily before breakfast 1 kit 0   buPROPion  (WELLBUTRIN  XL) 150 MG 24 hr tablet Take 150 mg by mouth daily.     cyclobenzaprine  (FLEXERIL ) 10 MG tablet Take 10 mg by mouth 3 (three) times daily as needed for muscle spasms.     dapagliflozin  propanediol (FARXIGA ) 10 MG TABS tablet Take 1 tablet (10 mg total) by mouth daily before breakfast. 90 tablet 3   dicyclomine  (BENTYL ) 10 MG capsule Take 1 capsule (10 mg total) by mouth every 6 (six) hours as needed for spasms. 60 capsule 3   diphenoxylate -atropine  (LOMOTIL ) 2.5-0.025 MG  tablet Take 2 tablets by mouth 4 (four) times daily as needed for diarrhea/ loose stools. 60 tablet 3   Eszopiclone  3 MG TABS Take 1 tablet (3 mg total) by mouth at bedtime. Take immediately before bedtime 30 tablet 5   feeding supplement (ENSURE ENLIVE / ENSURE PLUS) LIQD Take 237 mLs by mouth 2 (two) times daily between meals. 237 mL 12   Fezolinetant  (VEOZAH ) 45 MG TABS Take 1 tablet (45 mg total) by mouth daily. 30 tablet 6   fluticasone  (FLONASE ) 50 MCG/ACT nasal spray Place 1-2 sprays into both nostrils daily as needed (nasal congestion). 16 g 5   furosemide  (LASIX ) 20 MG tablet Take 2 tablets (40 mg total) by mouth 2 (two) times daily. 120 tablet 3   glucose blood test strip Use as instructed to check blood sugar daily before breakfast 100 each 6   ivabradine  (CORLANOR ) 5 MG TABS tablet Take 1/2 tablet (2.5 mg total) by mouth 2 (two) times daily. 30 tablet 5   lipase/protease/amylase (CREON ) 36000 UNITS  CPEP capsule Take 1 capsule (36,000 Units total) by mouth in the morning, at noon, and at bedtime. 270 capsule 1   metroNIDAZOLE  (METROGEL ) 1 % gel Apply 1 Application topically daily as needed (rash).     Multiple Vitamins-Minerals (MULTIVITAMIN WITH MINERALS) tablet Take 1 tablet by mouth daily.     oxymetazoline (AFRIN) 0.05 % nasal spray Place 1-2 sprays into both nostrils 2 (two) times daily as needed for congestion.     potassium chloride  SA (KLOR-CON  M) 20 MEQ tablet Take 4 tablets (80 mEq total) by mouth 2 (two) times daily. 240 tablet 3   promethazine  (PHENERGAN ) 25 MG tablet Take 1 tablet (25 mg total) by mouth every 6 (six) to 8 (eight) hours for nausea or vomiting. 90 tablet 1   sitaGLIPtin  (JANUVIA ) 50 MG tablet Take 1 tablet (50 mg total) by mouth daily. 90 tablet 1   triamcinolone  cream (KENALOG ) 0.1 % Apply 1 Application topically daily as needed (irritation).     valACYclovir  (VALTREX ) 500 MG tablet Take 1 tablet (500 mg total) by mouth 2 (two) times daily. 6 tablet 2    vedolizumab  (ENTYVIO ) 300 MG injection Inject 300 mg into the vein every 2 (two) months.     Vitamin D , Ergocalciferol , (DRISDOL ) 1.25 MG (50000 UNIT) CAPS capsule Take 1 capsule (50,000 Units total) by mouth every 7 (seven) days. 12 capsule 1   cetirizine  (ZYRTEC  ALLERGY ) 10 MG tablet Take 1 tablet (10 mg total) by mouth 2 (two) times daily. 60 tablet 5   Evolocumab  (REPATHA  SURECLICK) 140 MG/ML SOAJ Inject 140 mg into the skin every 14 (fourteen) days. (Patient not taking: Reported on 12/25/2023) 6 mL 3   famotidine  (PEPCID ) 20 MG tablet Take 1 tablet (20 mg total) by mouth 2 (two) times daily. 60 tablet 5   No current facility-administered medications for this visit.   Allergies: Allergies  Allergen Reactions   Gabapentin Diarrhea   Sulfa Antibiotics Hives, Itching and Rash   Erythromycin Hives   Tramadol  Rash and Other (See Comments)    Urinary retention     Ibuprofen Itching   Tape Rash   I reviewed her past medical history, social history, family history, and environmental history and no significant changes have been reported from her previous visit.  Review of Systems  Constitutional:  Positive for chills. Negative for appetite change, fever and unexpected weight change.  HENT:  Negative for congestion and rhinorrhea.   Eyes:  Negative for itching.  Respiratory:  Negative for cough, chest tightness, shortness of breath and wheezing.   Cardiovascular:  Negative for chest pain.  Gastrointestinal:  Negative for abdominal pain.  Genitourinary:  Negative for difficulty urinating.  Skin:  Negative for rash.  Allergic/Immunologic: Positive for environmental allergies.  Neurological:  Negative for headaches.    Objective: BP 94/68 (BP Location: Left Arm, Patient Position: Sitting, Cuff Size: Normal)   Pulse 90   Temp 98.1 F (36.7 C) (Temporal)   Resp 16   Ht 5' 10.25 (1.784 m)   Wt 177 lb (80.3 kg)   SpO2 97%   BMI 25.22 kg/m  Body mass index is 25.22 kg/m. Physical  Exam Vitals and nursing note reviewed.  Constitutional:      Appearance: Normal appearance. She is well-developed.  HENT:     Head: Normocephalic and atraumatic.     Right Ear: Tympanic membrane and external ear normal.     Left Ear: Tympanic membrane and external ear normal.     Nose: Nose  normal.     Mouth/Throat:     Mouth: Mucous membranes are moist.     Pharynx: Oropharynx is clear.  Eyes:     Conjunctiva/sclera: Conjunctivae normal.  Cardiovascular:     Rate and Rhythm: Normal rate and regular rhythm.     Heart sounds: Normal heart sounds. No murmur heard.    No friction rub. No gallop.  Pulmonary:     Effort: Pulmonary effort is normal.     Breath sounds: Normal breath sounds. No wheezing, rhonchi or rales.  Musculoskeletal:     Cervical back: Neck supple.  Skin:    General: Skin is warm.     Findings: No rash.  Neurological:     Mental Status: She is alert and oriented to person, place, and time.  Psychiatric:        Behavior: Behavior normal.    Previous notes and tests were reviewed. The plan was reviewed with the patient/family, and all questions/concerned were addressed.  It was my pleasure to see Christine [name] today and participate in her care. Please feel free to contact me with any questions or concerns.  Sincerely,  Orlan Cramp, DO Allergy  & Immunology  [facility]  [place] office: [phone] [facility] [place] office: [phone]

## 2023-12-25 NOTE — Patient Instructions (Addendum)
 Rash/itching  Keep track of rashes and take pictures. Write down what you had done/eaten during flares.  May use benadryl cream as needed for itching.  Continue proper skin care. Continue zyrtec  (cetirizine ) 10mg  twice a day. If symptoms are not controlled or causes drowsiness let us  know. Continue Pepcid  (famotidine ) 20mg  twice a day.  Avoid the following potential triggers: alcohol, tight clothing, NSAIDs, hot showers and getting overheated. Continue proper skin care.  Your tryptase was 12.4. Will repeat tryptase and check a c-kit marker due to your other co-morbid conditions.  Get bloodwork We are ordering labs, so please allow 1-2 weeks for the results to come back. With the newly implemented Cures Act, the labs might be visible to you at the same time that they become visible to me. However, I will not address the results until all of the results are back, so please be patient.  In the meantime, continue recommendations in your patient instructions, including avoidance measures (if applicable), until you hear from me.  Environmental allergies Environmental panel was positive to dust mites and borderline to cat and grass pollen. Continue environmental control measures. Use Flonase  (fluticasone ) nasal spray 1-2 sprays per nostril once a day as needed for nasal congestion.  Nasal saline spray (i.e., Simply Saline) or nasal saline lavage (i.e., NeilMed) is recommended as needed and prior to medicated nasal sprays.  Return in about 6 months (around 06/23/2024). Or sooner if needed.   Skin care recommendations  Bath time: Always use lukewarm water. AVOID very hot or cold water. Keep bathing time to 5-10 minutes. Do NOT use bubble bath. Use a mild soap and use just enough to wash the dirty areas. Do NOT scrub skin vigorously.  After bathing, pat dry your skin with a towel. Do NOT rub or scrub the skin.  Moisturizers and prescriptions:  ALWAYS apply moisturizers immediately after  bathing (within 3 minutes). This helps to lock-in moisture. Use the moisturizer several times a day over the whole body. Good summer moisturizers include: Aveeno, CeraVe, Cetaphil. Good winter moisturizers include: Aquaphor, Vaseline, Cerave, Cetaphil, Eucerin, Vanicream. When using moisturizers along with medications, the moisturizer should be applied about one hour after applying the medication to prevent diluting effect of the medication or moisturize around where you applied the medications. When not using medications, the moisturizer can be continued twice daily as maintenance.  Laundry and clothing: Avoid laundry products with added color or perfumes. Use unscented hypo-allergenic laundry products such as Tide free, Cheer free & gentle, and All free and clear.  If the skin still seems dry or sensitive, you can try double-rinsing the clothes. Avoid tight or scratchy clothing such as wool. Do not use fabric softeners or dyer sheets.   Control of House Dust Mite Allergen Dust mite allergens are a common trigger of allergy  and asthma symptoms. While they can be found throughout the house, these microscopic creatures thrive in warm, humid environments such as bedding, upholstered furniture and carpeting. Because so much time is spent in the bedroom, it is essential to reduce mite levels there.  Encase pillows, mattresses, and box springs in special allergen-proof fabric covers or airtight, zippered plastic covers.  Bedding should be washed weekly in hot water (130 F) and dried in a hot dryer. Allergen-proof covers are available for comforters and pillows that can't be regularly washed.  Wash the allergy -proof covers every few months. Minimize clutter in the bedroom. Keep pets out of the bedroom.  Keep humidity less than 50% by using a  dehumidifier or air conditioning. You can buy a humidity measuring device called a hygrometer to monitor this.  If possible, replace carpets with hardwood,  linoleum, or washable area rugs. If that's not possible, vacuum frequently with a vacuum that has a HEPA filter. Remove all upholstered furniture and non-washable window drapes from the bedroom. Remove all non-washable stuffed toys from the bedroom.  Wash stuffed toys weekly.  Pet Allergen Avoidance: Contrary to popular opinion, there are no "hypoallergenic" breeds of dogs or cats. That is because people are not allergic to an animal's hair, but to an allergen found in the animal's saliva, dander (dead skin flakes) or urine. Pet allergy  symptoms typically occur within minutes. For some people, symptoms can build up and become most severe 8 to 12 hours after contact with the animal. People with severe allergies can experience reactions in public places if dander has been transported on the pet owners' clothing. Keeping an animal outdoors is only a partial solution, since homes with pets in the yard still have higher concentrations of animal allergens. Before getting a pet, ask your allergist to determine if you are allergic to animals. If your pet is already considered part of your family, try to minimize contact and keep the pet out of the bedroom and other rooms where you spend a great deal of time. As with dust mites, vacuum carpets often or replace carpet with a hardwood floor, tile or linoleum. High-efficiency particulate air (HEPA) cleaners can reduce allergen levels over time. While dander and saliva are the source of cat and dog allergens, urine is the source of allergens from rabbits, hamsters, mice and israel pigs; so ask a non-allergic family member to clean the animal's cage. If you have a pet allergy , talk to your allergist about the potential for allergy  immunotherapy (allergy  shots). This strategy can often provide long-term relief.  Reducing Pollen Exposure Pollen seasons: trees (spring), grass (summer) and ragweed/weeds (fall). Keep windows closed in your home and car to lower pollen  exposure.  Install air conditioning in the bedroom and throughout the house if possible.  Avoid going out in dry windy days - especially early morning. Pollen counts are highest between 5 - 10 AM and on dry, hot and windy days.  Save outside activities for late afternoon or after a heavy rain, when pollen levels are lower.  Avoid mowing of grass if you have grass pollen allergy . Be aware that pollen can also be transported indoors on people and pets.  Dry your clothes in an automatic dryer rather than hanging them outside where they might collect pollen.  Rinse hair and eyes before bedtime.

## 2023-12-26 ENCOUNTER — Other Ambulatory Visit (HOSPITAL_COMMUNITY): Payer: Self-pay

## 2023-12-27 ENCOUNTER — Other Ambulatory Visit (HOSPITAL_COMMUNITY): Payer: Self-pay | Admitting: Gastroenterology

## 2023-12-27 DIAGNOSIS — Z419 Encounter for procedure for purposes other than remedying health state, unspecified: Secondary | ICD-10-CM | POA: Diagnosis not present

## 2024-01-01 ENCOUNTER — Other Ambulatory Visit: Payer: Self-pay | Admitting: Gastroenterology

## 2024-01-01 ENCOUNTER — Other Ambulatory Visit: Payer: Self-pay

## 2024-01-01 ENCOUNTER — Other Ambulatory Visit (HOSPITAL_COMMUNITY): Payer: Self-pay

## 2024-01-01 MED ORDER — PROMETHAZINE HCL 25 MG PO TABS
25.0000 mg | ORAL_TABLET | Freq: Four times a day (QID) | ORAL | 2 refills | Status: DC
  Filled 2024-01-01: qty 90, 23d supply, fill #0
  Filled 2024-01-23: qty 90, 23d supply, fill #1
  Filled 2024-01-30: qty 90, 23d supply, fill #2

## 2024-01-02 LAB — KIT (D816V) DIGITAL PCR

## 2024-01-03 ENCOUNTER — Ambulatory Visit: Payer: Self-pay | Admitting: Allergy

## 2024-01-03 ENCOUNTER — Ambulatory Visit (HOSPITAL_COMMUNITY): Payer: Self-pay | Admitting: Adult Health

## 2024-01-03 LAB — KIT (D816V) DIGITAL PCR: CKIT Result: NEGATIVE

## 2024-01-03 LAB — TRYPTASE: Tryptase: 12.3 ug/L (ref 2.2–13.2)

## 2024-01-03 NOTE — Progress Notes (Signed)
 ICM Remote transmission rescheduled for 01/14/2024.

## 2024-01-06 ENCOUNTER — Ambulatory Visit: Admitting: Physician Assistant

## 2024-01-06 ENCOUNTER — Encounter

## 2024-01-06 ENCOUNTER — Encounter: Payer: Self-pay | Admitting: Physician Assistant

## 2024-01-06 VITALS — BP 102/65 | HR 81

## 2024-01-06 DIAGNOSIS — Z8619 Personal history of other infectious and parasitic diseases: Secondary | ICD-10-CM

## 2024-01-06 DIAGNOSIS — R21 Rash and other nonspecific skin eruption: Secondary | ICD-10-CM

## 2024-01-06 NOTE — Progress Notes (Signed)
   New Patient Visit   Subjective  Christine [name] is a 58 y.o. female NEW PATIENT who presents for the following: spot check.   Has long history of rash that comes and goes. Has photos on her iPhone. Does have Crohn's disease and knows that you can get skin involvement. Has seen dermatologist in [facility] and underwent biopsy but never went back to follow up.   Patient states she has spot check located at the bikini area that she would like to have examined. She has had 3 episodes of herpes and is concerned that this could be the start. Denies pain.   The following portions of the chart were reviewed this encounter and updated as appropriate: medications, allergies, medical history  Review of Systems:  No other skin or systemic complaints except as noted in HPI or Assessment and Plan.  Objective  Well appearing patient in no apparent distress; mood and affect are within normal limits.   A focused examination was performed of the following areas: Face, neck, arms, legs and genital area.   Relevant exam findings are noted in the Assessment and Plan.    Assessment & Plan   HISTORY OF RASH -- CLEAR TODAY - she will call if recurs  - I did review some of her photos on her iPhone  HISTORY OF GENITAL HERPES - NO EVIDENCE TODAY  - reassurance provided  - call for Rx if she needs      RASH   HISTORY OF HERPES GENITALIS   Related Medications valACYclovir  (VALTREX ) 500 MG tablet Take 1 tablet (500 mg total) by mouth 2 (two) times daily.  Return in about 1 year (around 01/05/2025) for TBSE follow up.  I, Doyce Pan, CMA, am acting as scribe for [name], PA-C.   Documentation: I have reviewed the above documentation for accuracy and completeness, and I agree with the above.  Antinio Sanderfer K, PA-C

## 2024-01-06 NOTE — Patient Instructions (Signed)

## 2024-01-07 ENCOUNTER — Other Ambulatory Visit: Payer: Self-pay

## 2024-01-08 ENCOUNTER — Other Ambulatory Visit (HOSPITAL_COMMUNITY): Payer: Self-pay

## 2024-01-09 ENCOUNTER — Other Ambulatory Visit: Payer: Self-pay

## 2024-01-09 NOTE — Telephone Encounter (Signed)
 Pt aware and voiced understanding

## 2024-01-14 ENCOUNTER — Ambulatory Visit: Attending: Cardiology

## 2024-01-14 DIAGNOSIS — I5042 Chronic combined systolic (congestive) and diastolic (congestive) heart failure: Secondary | ICD-10-CM

## 2024-01-14 DIAGNOSIS — Z95810 Presence of automatic (implantable) cardiac defibrillator: Secondary | ICD-10-CM

## 2024-01-15 ENCOUNTER — Ambulatory Visit: Admitting: "Endocrinology

## 2024-01-15 ENCOUNTER — Other Ambulatory Visit (HOSPITAL_COMMUNITY): Payer: Self-pay

## 2024-01-15 ENCOUNTER — Other Ambulatory Visit (HOSPITAL_COMMUNITY): Payer: Self-pay | Admitting: Cardiology

## 2024-01-15 ENCOUNTER — Other Ambulatory Visit: Payer: Self-pay | Admitting: "Endocrinology

## 2024-01-15 ENCOUNTER — Other Ambulatory Visit: Payer: Self-pay

## 2024-01-15 ENCOUNTER — Encounter: Payer: Self-pay | Admitting: "Endocrinology

## 2024-01-15 VITALS — BP 102/80 | HR 84 | Ht 70.0 in | Wt 179.0 lb

## 2024-01-15 DIAGNOSIS — R6889 Other general symptoms and signs: Secondary | ICD-10-CM

## 2024-01-15 DIAGNOSIS — E559 Vitamin D deficiency, unspecified: Secondary | ICD-10-CM | POA: Diagnosis not present

## 2024-01-15 DIAGNOSIS — Z7984 Long term (current) use of oral hypoglycemic drugs: Secondary | ICD-10-CM | POA: Diagnosis not present

## 2024-01-15 DIAGNOSIS — E119 Type 2 diabetes mellitus without complications: Secondary | ICD-10-CM

## 2024-01-15 MED ORDER — POTASSIUM CHLORIDE CRYS ER 20 MEQ PO TBCR
80.0000 meq | EXTENDED_RELEASE_TABLET | Freq: Two times a day (BID) | ORAL | 3 refills | Status: DC
  Filled 2024-01-15: qty 240, 30d supply, fill #0
  Filled 2024-02-13: qty 240, 30d supply, fill #1
  Filled 2024-03-13: qty 240, 30d supply, fill #2
  Filled 2024-04-11: qty 240, 30d supply, fill #3

## 2024-01-15 NOTE — Progress Notes (Signed)
 Outpatient Endocrinology Note Christine Birmingham, MD    Christine [name] August 03, 1965 [id number]  Referring Provider: San Sandor GAILS, DO Primary Care Provider: Vicci Barnie NOVAK, MD Reason for consultation: Subjective   Assessment & Plan  Diagnoses and all orders for this visit:  Vitamin D  deficiency -     VITAMIN D  25 Hydroxy (Vit-D Deficiency, Fractures)  Temperature intolerance -     T4, free  Controlled type 2 diabetes mellitus without complication, without long-term current use of insulin  (HCC) -     Microalbumin / creatinine urine ratio   On Vit D 50,000 weekly  Last vitamin D  was low Ordered repeat labs today  Type 2 diabetes  Well-controlled on Farxiga , Januvia , atorvastatin , Repatha  Lab Results  Component Value Date   HGBA1C 5.5 09/03/2023   HGBA1C 6.4 05/20/2023   HGBA1C 7.1 (H) 12/08/2022   Defer to PCP  Patient complains of cold/hot temp which is an intolerance since around 07/2023 Has been on Veozah  for about 1 year Paroxetine cannot be prescribed for hot flashes since patient is on Wellbutrin  and the combination can lead to Wellbutrin  rise up to 2-5 times Defer to gynecologist TSH WNL in the past, ordered free T4  Return in about 3 months (around 04/16/2024) for visit, labs today.   I have reviewed current medications, nurse's notes, allergies, vital signs, past medical and surgical history, family medical history, and social history for this encounter. Counseled patient on symptoms, examination findings, lab findings, imaging results, treatment decisions and monitoring and prognosis. The patient understood the recommendations and agrees with the treatment plan. All questions regarding treatment plan were fully answered.  Christine Birmingham, MD  01/15/24   History of Present Illness HPI  Christine [name] is a 58 y.o. year old female who presents for evaluation of vitamin D  deficiency, although patient also reports temperature intolerance and type 2  diabetes.  C/o waking up feeling cold since 07/2023 followed by hot flashes Reports complicate medication history of poor  Is on Veozah  for hot flashes since about a year Is on Vit D 50,000 weekly  Intentional weight loss of 20 lbs by going to gym in past 5 mo   Physical Exam  BP 102/80   Pulse 84   Ht 5' 10 (1.778 m)   Wt 179 lb (81.2 kg)   SpO2 96%   BMI 25.68 kg/m    Constitutional: well developed, well nourished Head: normocephalic, atraumatic Eyes: sclera anicteric, no redness Neck: supple Lungs: normal respiratory effort Neurology: alert and oriented Skin: dry, no appreciable rashes Musculoskeletal: no appreciable defects Psychiatric: normal mood and affect   Current Medications Patient's Medications  New Prescriptions   No medications on file  Previous Medications   ACCU-CHEK SOFTCLIX LANCETS LANCETS    Use as instructed.  Check blood sugar daily before breakfast.   ACETAMINOPHEN  (TYLENOL ) 500 MG TABLET    Take 1,000 mg by mouth 3 (three) times daily as needed for moderate pain or headache.   ALBUTEROL  (VENTOLIN  HFA) 108 (90 BASE) MCG/ACT INHALER    Inhale 2 puffs into the lungs every 6 (six) hours as needed for wheezing or shortness of breath.   ALPRAZOLAM  (XANAX ) 1 MG TABLET    Take 2 mg by mouth 3 (three) times daily.   ALUM & MAG HYDROXIDE-SIMETH (MAALOX/MYLANTA) 200-200-20 MG/5ML SUSPENSION    Take 30 mLs by mouth every 4 (four) hours as needed for indigestion or heartburn.   APIXABAN  (ELIQUIS ) 5 MG TABS TABLET  Take 1 tablet (5 mg total) by mouth 2 (two) times daily.   ATORVASTATIN  (LIPITOR ) 80 MG TABLET    Take 1 tablet (80 mg total) by mouth daily.   BLOOD GLUCOSE MONITORING SUPPL (ACCU-CHEK GUIDE) W/DEVICE KIT    Check blood sugar once daily before breakfast   BUPROPION  (WELLBUTRIN  XL) 150 MG 24 HR TABLET    Take 150 mg by mouth daily.   CETIRIZINE  (ZYRTEC  ALLERGY ) 10 MG TABLET    Take 1 tablet (10 mg total) by mouth 2 (two) times daily.   CYCLOBENZAPRINE   (FLEXERIL ) 10 MG TABLET    Take 10 mg by mouth 3 (three) times daily as needed for muscle spasms.   DAPAGLIFLOZIN  PROPANEDIOL (FARXIGA ) 10 MG TABS TABLET    Take 1 tablet (10 mg total) by mouth daily before breakfast.   DICYCLOMINE  (BENTYL ) 10 MG CAPSULE    Take 1 capsule (10 mg total) by mouth every 6 (six) hours as needed for spasms.   DIPHENOXYLATE -ATROPINE  (LOMOTIL ) 2.5-0.025 MG TABLET    Take 2 tablets by mouth 4 (four) times daily as needed for diarrhea/ loose stools.   ESZOPICLONE  3 MG TABS    Take 1 tablet (3 mg total) by mouth at bedtime. Take immediately before bedtime   EVOLOCUMAB  (REPATHA  SURECLICK) 140 MG/ML SOAJ    Inject 140 mg into the skin every 14 (fourteen) days.   FAMOTIDINE  (PEPCID ) 20 MG TABLET    Take 1 tablet (20 mg total) by mouth 2 (two) times daily.   FEEDING SUPPLEMENT (ENSURE ENLIVE / ENSURE PLUS) LIQD    Take 237 mLs by mouth 2 (two) times daily between meals.   FEZOLINETANT  (VEOZAH ) 45 MG TABS    Take 1 tablet (45 mg total) by mouth daily.   FLUTICASONE  (FLONASE ) 50 MCG/ACT NASAL SPRAY    Place 1-2 sprays into both nostrils daily as needed (nasal congestion).   FUROSEMIDE  (LASIX ) 20 MG TABLET    Take 2 tablets (40 mg total) by mouth 2 (two) times daily.   GLUCOSE BLOOD TEST STRIP    Use as instructed to check blood sugar daily before breakfast   IVABRADINE  (CORLANOR ) 5 MG TABS TABLET    Take 1/2 tablet (2.5 mg total) by mouth 2 (two) times daily.   LIPASE/PROTEASE/AMYLASE (CREON ) 36000 UNITS CPEP CAPSULE    Take 1 capsule (36,000 Units total) by mouth in the morning, at noon, and at bedtime.   METRONIDAZOLE  (METROGEL ) 1 % GEL    Apply 1 Application topically daily as needed (rash).   MULTIPLE VITAMINS-MINERALS (MULTIVITAMIN WITH MINERALS) TABLET    Take 1 tablet by mouth daily.   OXYMETAZOLINE (AFRIN) 0.05 % NASAL SPRAY    Place 1-2 sprays into both nostrils 2 (two) times daily as needed for congestion.   PROMETHAZINE  (PHENERGAN ) 25 MG TABLET    Take 1 tablet (25 mg  total) by mouth every 6 (six) to 8 (eight) hours for nausea or vomiting.   SITAGLIPTIN  (JANUVIA ) 50 MG TABLET    Take 1 tablet (50 mg total) by mouth daily.   TRIAMCINOLONE  CREAM (KENALOG ) 0.1 %    Apply 1 Application topically daily as needed (irritation).   VALACYCLOVIR  (VALTREX ) 500 MG TABLET    Take 1 tablet (500 mg total) by mouth 2 (two) times daily.   VEDOLIZUMAB  (ENTYVIO ) 300 MG INJECTION    Inject 300 mg into the vein every 2 (two) months.   VITAMIN D , ERGOCALCIFEROL , (DRISDOL ) 1.25 MG (50000 UNIT) CAPS CAPSULE    Take 1  capsule (50,000 Units total) by mouth every 7 (seven) days.  Modified Medications   Modified Medication Previous Medication   POTASSIUM CHLORIDE  SA (KLOR-CON  M) 20 MEQ TABLET potassium chloride  SA (KLOR-CON  M) 20 MEQ tablet      Take 4 tablets (80 mEq total) by mouth 2 (two) times daily.    Take 4 tablets (80 mEq total) by mouth 2 (two) times daily.  Discontinued Medications   No medications on file    Allergies Allergies  Allergen Reactions   Gabapentin Diarrhea   Sulfa Antibiotics Hives, Itching and Rash   Erythromycin Hives   Tramadol  Rash and Other (See Comments)    Urinary retention     Ibuprofen Itching   Tape Rash    Past Medical History Past Medical History:  Diagnosis Date   Allergy     Anxiety 01/20/2021   CAD in native artery 08/24/2021   CHF (congestive heart failure) (HCC)    Chronic combined systolic and diastolic heart failure (HCC) 08/24/2021   Complication of anesthesia    Crohn's colitis (HCC)    Depression    Hyperlipidemia    Myocardial infarction [facility])    Presence of cardiac defibrillator 07/2021   Shortness of breath 01/20/2021   Sleep apnea 07/2021   Tobacco abuse 01/20/2021   Urticaria     Past Surgical History Past Surgical History:  Procedure Laterality Date   ABDOMINAL HYSTERECTOMY     BIOPSY  02/26/2022   Procedure: BIOPSY;  Surgeon: San Sandor GAILS, DO;  Location: [facility];  Service: Gastroenterology;;    CARDIAC CATHETERIZATION     CHOLECYSTECTOMY     COLONOSCOPY WITH PROPOFOL  N/A 02/26/2022   Procedure: COLONOSCOPY WITH PROPOFOL ;  Surgeon: San Sandor GAILS, DO;  Location: [facility];  Service: Gastroenterology;  Laterality: N/A;   ESOPHAGOGASTRODUODENOSCOPY (EGD) WITH PROPOFOL  N/A 02/26/2022   Procedure: ESOPHAGOGASTRODUODENOSCOPY (EGD) WITH PROPOFOL ;  Surgeon: San Sandor GAILS, DO;  Location: [facility];  Service: Gastroenterology;  Laterality: N/A;   ICD IMPLANT N/A 07/21/2021   Procedure: ICD IMPLANT;  Surgeon: Fernande Elspeth BROCKS, MD;  Location: [facility] INVASIVE CV LAB;  Service: Cardiovascular;  Laterality: N/A;   NECK SURGERY     OVARIAN CYST SURGERY     POLYPECTOMY  02/26/2022   Procedure: POLYPECTOMY;  Surgeon: San Sandor GAILS, DO;  Location: [facility];  Service: Gastroenterology;;   RIGHT HEART CATH N/A 07/12/2023   Procedure: RIGHT HEART CATH;  Surgeon: Cherrie Toribio SAUNDERS, MD;  Location: [facility] INVASIVE CV LAB;  Service: Cardiovascular;  Laterality: N/A;   RIGHT/LEFT HEART CATH AND CORONARY ANGIOGRAPHY N/A 01/23/2021   Procedure: RIGHT/LEFT HEART CATH AND CORONARY ANGIOGRAPHY;  Surgeon: Cherrie Toribio SAUNDERS, MD;  Location: [facility] INVASIVE CV LAB;  Service: Cardiovascular;  Laterality: N/A;   TONSILLECTOMY      Family History family history includes Asthma in her brother; Atrial fibrillation in her mother; Eczema in her mother; Heart failure in her father; Stomach cancer in her maternal grandfather; Stroke in her mother.  Social History Social History   Socioeconomic History   Marital status: Single    Spouse name: Not on file   Number of children: 0   Years of education: 16   Highest education level: Bachelor's degree (e.g., BA, AB, BS)  Occupational History   Not on file  Tobacco Use   Smoking status: Former    Current packs/day: 0.00    Types: Cigarettes    Quit date: 01/15/2021    Years since quitting: 3.0    Passive exposure:  Past   Smokeless tobacco: Never   Vaping Use   Vaping status: Never Used  Substance and Sexual Activity   Alcohol use: Not Currently   Drug use: Never   Sexual activity: Not on file  Other Topics Concern   Not on file  Social History Narrative   Not on file   Social Drivers of Health   Financial Resource Strain: Medium Risk (10/20/2023)   Overall Financial Resource Strain (CARDIA)    Difficulty of Paying Living Expenses: Somewhat hard  Food Insecurity: No Food Insecurity (10/20/2023)   Hunger Vital Sign    Worried About Running Out of Food in the Last Year: Never true    Ran Out of Food in the Last Year: Never true  Transportation Needs: No Transportation Needs (10/20/2023)   PRAPARE - Administrator, Civil Service (Medical): No    Lack of Transportation (Non-Medical): No  Physical Activity: Sufficiently Active (10/20/2023)   Exercise Vital Sign    Days of Exercise per Week: 5 days    Minutes of Exercise per Session: 30 min  Recent Concern: Physical Activity - Insufficiently Active (09/03/2023)   Exercise Vital Sign    Days of Exercise per Week: 5 days    Minutes of Exercise per Session: 20 min  Stress: Stress Concern Present (10/20/2023)   [employer] of Occupational Health - Occupational Stress Questionnaire    Feeling of Stress: Rather much  Social Connections: Socially Isolated (10/20/2023)   Social Connection and Isolation Panel    Frequency of Communication with Friends and Family: Three times a week    Frequency of Social Gatherings with Friends and Family: Once a week    Attends Religious Services: Never    Database administrator or Organizations: No    Attends Engineer, structural: Not on file    Marital Status: Never married  Intimate Partner Violence: Not At Risk (09/03/2023)   Humiliation, Afraid, Rape, and Kick questionnaire    Fear of Current or Ex-Partner: No    Emotionally Abused: No    Physically Abused: No    Sexually Abused: No    Lab Results  Component Value Date    CHOL 174 10/24/2023   Lab Results  Component Value Date   HDL 42 10/24/2023   Lab Results  Component Value Date   LDLCALC 112 (H) 10/24/2023   Lab Results  Component Value Date   TRIG 111 10/24/2023   Lab Results  Component Value Date   CHOLHDL 4.1 10/24/2023   Lab Results  Component Value Date   CREATININE 0.96 12/15/2023   Lab Results  Component Value Date   GFR 39.89 (L) 07/29/2023      Component Value Date/Time   NA 142 12/15/2023 1127   NA 140 09/03/2023 1521   K 3.7 12/15/2023 1127   CL 107 12/15/2023 1127   CO2 22 12/15/2023 1127   GLUCOSE 101 (H) 12/15/2023 1127   BUN 13 12/15/2023 1127   BUN 14 09/03/2023 1521   CREATININE 0.96 12/15/2023 1127   CREATININE 0.90 12/25/2021 1119   CALCIUM  9.5 12/15/2023 1127   PROT 7.0 12/15/2023 1127   PROT 7.6 04/22/2023 1356   ALBUMIN 4.3 12/15/2023 1127   ALBUMIN 4.7 04/22/2023 1356   AST 23 12/15/2023 1127   AST 24 12/25/2021 1119   ALT 23 12/15/2023 1127   ALT 32 12/25/2021 1119   ALKPHOS 131 (H) 12/15/2023 1127   BILITOT 0.5 12/15/2023 1127   BILITOT 0.5  04/22/2023 1356   BILITOT 0.3 12/25/2021 1119   GFRNONAA >60 12/15/2023 1127   GFRNONAA >60 12/25/2021 1119   GFRAA  03/31/2008 1140    >60        The eGFR has been calculated using the MDRD equation. This calculation has not been validated in all clinical      Latest Ref Rng & Units 12/15/2023   11:27 AM 11/06/2023   12:41 PM 09/03/2023    3:21 PM  BMP  Glucose 70 - 99 mg/dL 898  95  98   BUN 6 - 20 mg/dL 13  13  14    Creatinine 0.44 - 1.00 mg/dL 9.03  8.93  8.96   BUN/Creat Ratio 9 - 23   14   Sodium 135 - 145 mmol/L 142  138  140   Potassium 3.5 - 5.1 mmol/L 3.7  3.9  4.1   Chloride 98 - 111 mmol/L 107  101  105   CO2 22 - 32 mmol/L 22  24  15    Calcium  8.9 - 10.3 mg/dL 9.5  9.6  9.8        Component Value Date/Time   WBC 7.2 12/15/2023 1127   RBC 4.81 12/15/2023 1127   HGB 13.9 12/15/2023 1127   HGB 16.0 (H) 04/22/2023 1356   HCT 41.0  12/15/2023 1127   HCT 47.9 (H) 04/22/2023 1356   PLT 281 12/15/2023 1127   PLT 361 04/22/2023 1356   MCV 85.2 12/15/2023 1127   MCV 84 04/22/2023 1356   MCH 28.9 12/15/2023 1127   MCHC 33.9 12/15/2023 1127   RDW 13.5 12/15/2023 1127   RDW 12.5 04/22/2023 1356   LYMPHSABS 2.0 12/15/2023 1127   LYMPHSABS 1.6 04/22/2023 1356   MONOABS 0.4 12/15/2023 1127   EOSABS 0.2 12/15/2023 1127   EOSABS 0.5 (H) 04/22/2023 1356   BASOSABS 0.0 12/15/2023 1127   BASOSABS 0.1 04/22/2023 1356   Lab Results  Component Value Date   TSH 0.56 07/29/2023   TSH 1.013 07/09/2023   TSH 1.30 02/21/2023   FREET4 1.25 11/23/2021   FREET4 1.02 01/20/2021         Parts of this note may have been dictated using voice recognition software. There may be variances in spelling and vocabulary which are unintentional. Not all errors are proofread. Please notify the dino if any discrepancies are noted or if the meaning of any statement is not clear.

## 2024-01-16 ENCOUNTER — Ambulatory Visit: Payer: Self-pay | Admitting: "Endocrinology

## 2024-01-16 LAB — T4, FREE: Free T4: 1.2 ng/dL (ref 0.8–1.8)

## 2024-01-16 LAB — VITAMIN D 25 HYDROXY (VIT D DEFICIENCY, FRACTURES): Vit D, 25-Hydroxy: 124 ng/mL — ABNORMAL HIGH (ref 30–100)

## 2024-01-16 LAB — MICROALBUMIN / CREATININE URINE RATIO
Creatinine, Urine: 15 mg/dL — ABNORMAL LOW (ref 20–275)
Microalb Creat Ratio: 20 mg/g{creat} (ref <30)
Microalb, Ur: 0.3 mg/dL

## 2024-01-16 NOTE — Progress Notes (Signed)
 EPIC Encounter for ICM Monitoring  Patient Name: Christine [name] is a 58 y.o. female Date: 01/16/2024 Primary Care Physican: Vicci Barnie NOVAK, MD Primary Cardiologist: [place]/Bensimhon Electrophysiologist: Kennyth 05/27/2023 Weight: 182 - 183 lbs (baseline)          06/24/2023 Weight: 181 lbs 06/26/2023 Weight: 188 lbs          08/02/2023 Weight: 184-185 lbs  09/13/2023 Weight: 184 lbs   (6 lbs less than hospitalization)     10/24/2023 Office Weight: 183 lbs 12/06/2023 Weight: 182 lbs   01/16/2024 Weight: 176-178 lbs                          Spoke with patient and heart failure questions reviewed.  Transmission results reviewed.  Pt asymptomatic for fluid accumulation.  Reports feeling well at this time and voices no complaints.   Pt had slight weight gain over labor day weekend but is back to baseline.    HeartLogic Heart Failure Index remains 0 suggesting fluid levels are within normal threshold range.  Thoracic Impedance trending low over Labor Day Holiday weekend.    Prescribed:  Furosemide  20 mg take 2 tablet(s) (40 mg total) by mouth twice a day Potassium 20 mEq take 4 tablet(s) (80 mEq total) by mouth two (2) times a day Spironolactone  25 mg take 1 tablet by mouth daily.    Labs: 12/15/2023 Creatinine 0.96, BUN 13, Potassium 3.7, Sodium 142, >60 11/06/2023 Creatinine 1.06, BUN 13, Potassium 3.9, Sodium 138, GFR >60 09/03/2023 Creatinine 1.03, BUN 14, Potassium 4.1, Sodium 140  08/26/2023 Creatinine 1.08, BUN 15, Potassium 3.1, Sodium 136, GFR >60  08/25/2023 Creatinine 1.32, BUN 17, Potassium 4.9, Sodium 135, GFR 48  08/24/2023 Creatinine 1.31, BUN 25, Potassium 4.9, Sodium 137, GFR 48 A complete set of results can be found in Results Review.   Recommendations:  No changes and encouraged to call if experiencing any fluid symptoms.   Follow-up plan: ICM clinic phone appointment on 02/17/2024.   91 day device clinic remote transmission 01/22/2024.              EP/Cardiology next  office visit:  Recall 02/04/2024 with Dr Cherrie.   Recall 03/15/2024 with Dr Fernande.          Copy of ICM check sent to Dr. Kennyth.    Remote Monitoring Medically Necessary for Heart Failure Management.  3 Month HeartLogicT Heart Failure Index:    8 Day Data Trend:          [name], RN 01/16/2024 4:22 PM

## 2024-01-19 ENCOUNTER — Other Ambulatory Visit: Payer: Self-pay | Admitting: Internal Medicine

## 2024-01-22 ENCOUNTER — Ambulatory Visit: Payer: 59

## 2024-01-22 DIAGNOSIS — I5042 Chronic combined systolic (congestive) and diastolic (congestive) heart failure: Secondary | ICD-10-CM

## 2024-01-24 ENCOUNTER — Other Ambulatory Visit (HOSPITAL_COMMUNITY): Payer: Self-pay

## 2024-01-24 LAB — CUP PACEART REMOTE DEVICE CHECK
Battery Remaining Longevity: 144 mo
Battery Remaining Percentage: 100 %
Brady Statistic RV Percent Paced: 0 %
Date Time Interrogation Session: 20251009095500
HighPow Impedance: 80 Ohm
Implantable Lead Connection Status: 753985
Implantable Lead Implant Date: 20230407
Implantable Lead Location: 753860
Implantable Lead Model: 138
Implantable Lead Serial Number: 303166
Implantable Pulse Generator Implant Date: 20230407
Lead Channel Impedance Value: 869 Ohm
Lead Channel Pacing Threshold Amplitude: 0.8 V
Lead Channel Pacing Threshold Pulse Width: 0.4 ms
Lead Channel Setting Pacing Amplitude: 2.5 V
Lead Channel Setting Pacing Pulse Width: 0.4 ms
Lead Channel Setting Sensing Sensitivity: 0.5 mV
Pulse Gen Serial Number: 216478
Zone Setting Status: 755011

## 2024-01-24 NOTE — Progress Notes (Signed)
 Remote ICD Transmission

## 2024-01-27 NOTE — Progress Notes (Signed)
 Remote ICD Transmission

## 2024-01-29 ENCOUNTER — Ambulatory Visit: Payer: Self-pay | Admitting: Cardiology

## 2024-01-30 ENCOUNTER — Encounter (HOSPITAL_COMMUNITY): Payer: Self-pay | Admitting: Gastroenterology

## 2024-01-30 ENCOUNTER — Other Ambulatory Visit (HOSPITAL_COMMUNITY): Payer: Self-pay

## 2024-01-30 ENCOUNTER — Ambulatory Visit (HOSPITAL_COMMUNITY)
Admission: RE | Admit: 2024-01-30 | Discharge: 2024-01-30 | Disposition: A | Discharge status: Home / Self Care | Source: Ambulatory Visit | Attending: Cardiology | Admitting: Cardiology

## 2024-01-30 ENCOUNTER — Inpatient Hospital Stay (HOSPITAL_COMMUNITY): Admission: RE | Admit: 2024-01-30 | Source: Ambulatory Visit

## 2024-01-30 VITALS — BP 102/63 | HR 82 | Temp 98.2°F | Resp 16 | Wt 177.0 lb

## 2024-01-30 DIAGNOSIS — K5000 Crohn's disease of small intestine without complications: Secondary | ICD-10-CM | POA: Diagnosis not present

## 2024-01-30 MED ORDER — VEDOLIZUMAB 300 MG IV SOLR
300.0000 mg | Freq: Once | INTRAVENOUS | Status: AC
  Administered 2024-01-30: 300 mg via INTRAVENOUS
  Filled 2024-01-30: qty 5

## 2024-01-30 MED ORDER — SODIUM CHLORIDE 0.9 % IV SOLN: INTRAVENOUS | Status: DC

## 2024-02-14 ENCOUNTER — Other Ambulatory Visit (HOSPITAL_COMMUNITY): Payer: Self-pay

## 2024-02-14 ENCOUNTER — Other Ambulatory Visit: Payer: Self-pay | Admitting: Internal Medicine

## 2024-02-14 ENCOUNTER — Other Ambulatory Visit: Payer: Self-pay

## 2024-02-14 MED ORDER — PANCRELIPASE (LIP-PROT-AMYL) 36000-114000 UNITS PO CPEP
36000.0000 [IU] | ORAL_CAPSULE | Freq: Three times a day (TID) | ORAL | 0 refills | Status: AC
  Filled 2024-02-14: qty 200, 67d supply, fill #0
  Filled 2024-04-21: qty 100, 34d supply, fill #1

## 2024-02-15 ENCOUNTER — Other Ambulatory Visit (HOSPITAL_COMMUNITY): Payer: Self-pay

## 2024-02-17 ENCOUNTER — Ambulatory Visit: Attending: Cardiology

## 2024-02-17 ENCOUNTER — Encounter: Payer: Self-pay | Admitting: Radiology

## 2024-02-17 DIAGNOSIS — I5042 Chronic combined systolic (congestive) and diastolic (congestive) heart failure: Secondary | ICD-10-CM

## 2024-02-17 DIAGNOSIS — Z95810 Presence of automatic (implantable) cardiac defibrillator: Secondary | ICD-10-CM | POA: Diagnosis not present

## 2024-02-18 ENCOUNTER — Telehealth: Payer: Self-pay

## 2024-02-18 NOTE — Telephone Encounter (Signed)
 Remote ICM transmission received.  Attempted call to patient regarding ICM remote transmission.  Left detailed message per DPR with ICM phone number to return call for any questions, concerns or fluid symptoms.

## 2024-02-18 NOTE — Progress Notes (Signed)
 EPIC Encounter for ICM Monitoring  Patient Name: Christine [name] is a 58 y.o. female Date: 02/18/2024 Primary Care Physican: Vicci Barnie NOVAK, MD Primary Cardiologist: [place]/Bensimhon Electrophysiologist: Kennyth 05/27/2023 Weight: 182 - 183 lbs (baseline)          06/24/2023 Weight: 181 lbs 06/26/2023 Weight: 188 lbs          08/02/2023 Weight: 184-185 lbs  09/13/2023 Weight: 184 lbs   (6 lbs less than hospitalization)     10/24/2023 Office Weight: 183 lbs 12/06/2023 Weight: 182 lbs   01/16/2024 Weight: 176-178 lbs                          Attempted call to patient and unable to reach.  Left detailed message per DPR regarding transmission.  Transmission results reviewed.    HeartLogic Heart Failure Index remains 0 suggesting fluid levels are within normal threshold range.  Thoracic Impedance trending low starting 02/13/2024.    Prescribed:  Furosemide  20 mg take 2 tablet(s) (40 mg total) by mouth twice a day Potassium 20 mEq take 4 tablet(s) (80 mEq total) by mouth two (2) times a day Spironolactone  25 mg take 1 tablet by mouth daily.    Labs: 12/15/2023 Creatinine 0.96, BUN 13, Potassium 3.7, Sodium 142, >60 11/06/2023 Creatinine 1.06, BUN 13, Potassium 3.9, Sodium 138, GFR >60 09/03/2023 Creatinine 1.03, BUN 14, Potassium 4.1, Sodium 140  08/26/2023 Creatinine 1.08, BUN 15, Potassium 3.1, Sodium 136, GFR >60  08/25/2023 Creatinine 1.32, BUN 17, Potassium 4.9, Sodium 135, GFR 48  08/24/2023 Creatinine 1.31, BUN 25, Potassium 4.9, Sodium 137, GFR 48 A complete set of results can be found in Results Review.   Recommendations:  Left voice mail with ICM number and encouraged to call if experiencing any fluid symptoms.   Follow-up plan: ICM clinic phone appointment on 03/30/2024.   91 day device clinic remote transmission 04/22/2024.              EP/Cardiology next office visit: 04/02/2024 with Dr Cherrie.   Recall 03/15/2024 with Dr Fernande.          Copy of ICM check sent to Dr.  Kennyth.    Remote Monitoring Medically Necessary for Heart Failure Management.  3 Month HeartLogicT Heart Failure Index:    8 Day Data Trend:          Christine GORMAN Garner, RN 02/18/2024 8:35 AM

## 2024-02-21 ENCOUNTER — Other Ambulatory Visit (HOSPITAL_COMMUNITY): Payer: Self-pay

## 2024-02-24 ENCOUNTER — Ambulatory Visit: Attending: Internal Medicine | Admitting: Internal Medicine

## 2024-02-24 ENCOUNTER — Encounter: Payer: Self-pay | Admitting: Internal Medicine

## 2024-02-24 VITALS — BP 106/65 | HR 74 | Temp 97.8°F | Ht 70.0 in | Wt 175.0 lb

## 2024-02-24 DIAGNOSIS — F331 Major depressive disorder, recurrent, moderate: Secondary | ICD-10-CM

## 2024-02-24 DIAGNOSIS — R6889 Other general symptoms and signs: Secondary | ICD-10-CM

## 2024-02-24 DIAGNOSIS — E785 Hyperlipidemia, unspecified: Secondary | ICD-10-CM

## 2024-02-24 DIAGNOSIS — G4733 Obstructive sleep apnea (adult) (pediatric): Secondary | ICD-10-CM

## 2024-02-24 DIAGNOSIS — Z23 Encounter for immunization: Secondary | ICD-10-CM

## 2024-02-24 DIAGNOSIS — I5022 Chronic systolic (congestive) heart failure: Secondary | ICD-10-CM | POA: Diagnosis not present

## 2024-02-24 DIAGNOSIS — Z7984 Long term (current) use of oral hypoglycemic drugs: Secondary | ICD-10-CM

## 2024-02-24 DIAGNOSIS — I2510 Atherosclerotic heart disease of native coronary artery without angina pectoris: Secondary | ICD-10-CM | POA: Diagnosis not present

## 2024-02-24 DIAGNOSIS — E1169 Type 2 diabetes mellitus with other specified complication: Secondary | ICD-10-CM

## 2024-02-24 DIAGNOSIS — E119 Type 2 diabetes mellitus without complications: Secondary | ICD-10-CM

## 2024-02-24 LAB — POCT GLYCOSYLATED HEMOGLOBIN (HGB A1C): HbA1c, POC (controlled diabetic range): 5.9 % (ref 0.0–7.0)

## 2024-02-24 LAB — GLUCOSE, POCT (MANUAL RESULT ENTRY): POC Glucose: 132 mg/dL — AB (ref 70–99)

## 2024-02-24 NOTE — Patient Instructions (Signed)
  VISIT SUMMARY: Today, we discussed your persistent cold intolerance and reviewed your overall health management. Your diabetes remains well-controlled, and you are maintaining a good exercise routine. We also reviewed your medications for congestive heart failure, hyperlipidemia, sleep apnea, and depression. You received a flu shot today, and we discussed continuing your current health maintenance routine.  YOUR PLAN: -COLD INTOLERANCE, UNEXPLAINED: You have been experiencing persistent cold intolerance since April, despite normal thyroid  levels and no anemia. We have not identified a clear cause for your symptoms. Please continue to regulate the temperature at home to stay comfortable.  -TYPE 2 DIABETES MELLITUS: Your diabetes is well-controlled with an A1c of 5.5. Continue taking Januvia  and Farxiga , and maintain your regular exercise and healthy diet.  -CONGESTIVE HEART FAILURE: Your congestive heart failure is well-managed with your current medications. Continue taking Corlanor , Repatha , and Eliquis  as prescribed.  -HYPERLIPIDEMIA: Your cholesterol levels are being managed with Repatha  and Lipitor . We will check your lipid panel in December to monitor your progress. Continue taking your medications as prescribed.  -OBSTRUCTIVE SLEEP APNEA: Your sleep apnea is being managed with the use of a CPAP machine. Continue using your CPAP machine as directed.  -DEPRESSION: Your depression is being treated with Wellbutrin , which was recently increased to 300 mg, and alprazolam . You have noted some improvement in your symptoms. Continue taking your medications as prescribed.  -GENERAL HEALTH MAINTENANCE: You received a flu shot today. Continue with your routine health maintenance and follow any additional recommendations as needed.  INSTRUCTIONS: Please follow up with your endocrinologist if your cold intolerance persists or worsens. Continue with your current medications and health routines. We will  check your lipid panel in December. If you have any new symptoms or concerns, please schedule an appointment.                      Contains text generated by Abridge.                                 Contains text generated by Abridge.

## 2024-02-24 NOTE — Progress Notes (Signed)
 Patient ID: Christine [name], female    DOB: Oct 22, 1965  MRN: [id number]  CC: Follow-up (Follow-up. /Reports chills & constantly cold - discuss /Discuss vit D levels - reportedly toxically high /Flu vax administered on 02/24/2024 - C.A.)   Subjective: Christine [name] is a 58 y.o. female who presents for chronic ds management. 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   Discussed the use of AI scribe software for clinical note transcription with the patient, who gave verbal consent to proceed.  History of Present Illness Christine [name] is a 58 year old female who presents with persistent cold intolerance.  She has experienced persistent cold intolerance since April, despite normal thyroid  levels and no anemia. She experiences chills even when bundled up and indoors, with the thermostat set at 73 degrees. An endocrinologist focused on her elevated vitamin D  levels, leading to discontinuation of supplementation, but no other causes for her symptoms were identified, and she remains without a clear diagnosis. She wonders whether due to menopause. Feels [place]  controls her hot flashes well.  DM: Her diabetes is well-controlled, with her last A1c recorded today is 5.9. She is currently taking Januvia  50 mg and Farxiga , and her blood sugar level was 99 this morning. She has been attending [employer] since August, aiming for three to five sessions per week, and is working on resolving transportation issues to increase her attendance. She uses TM, elliptical and bike while there  Her cholesterol management includes Repatha  injections twice a month and Lipitor  80 mg, with a follow-up check  scheduled for December. She has congestive heart failure and CAD. She discontinued spironolactone  six to eight months ago, which helped stabilize her blood pressure. She continues on Furosemide  40 mg twice a day, Potassium supplement, Corlanor , Eliquis , and Farxiga  without issues.  She uses a CPAP machine for sleep apnea and has recently switched from Lunesta  to Ambien  for sleep, as managed by her mental health provider. Her depression is being treated with Wellbutrin , recently increased to 300 mg, and alprazolam . She notes some improvement in her symptoms, particularly a reduction in waking up crying.  For her gastrointestinal issues/Crohn's, she takes Creon  and receives Entyvio  infusions every two months. Stable on these meds.    Patient Active Problem List   Diagnosis Date Noted   Hyperlipidemia LDL goal <55 12/23/2023   Hypotension 08/25/2023   Cardiogenic shock (HCC) 08/24/2023   Abnormal laboratory test result 06/26/2023   Crohn's disease involving terminal ileum (HCC) 07/23/2022   Crohn's disease of large intestine with other complication (HCC) 05/11/2022   Mild major depression 05/11/2022   Paroxysmal tachycardia, unspecified (HCC) 05/11/2022   Acute pancreatitis 03/01/2022   Hypokalemia 03/01/2022   Ileitis 03/01/2022   Diarrhea 02/26/2022   Nausea without vomiting 02/26/2022   Loss of weight 02/26/2022   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   Ulcer of ileum 02/26/2022   Diverticulosis of colon without hemorrhage 02/26/2022   Cardiomyopathy (HCC) ischemic and non-ischemic 01/16/2022   Abdominal aortic aneurysm (AAA) 3.0 cm to 5.0 cm in diameter in female 01/09/2022   Polycythemia 01/09/2022   Chronic combined systolic and diastolic heart failure (HCC) 08/24/2021   CAD in native artery 08/24/2021   S/P ICD (  internal cardiac defibrillator) procedure 08/10/2021   Obstructive sleep apnea 05/05/2021   Former smoker 02/06/2021   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   Obesity  (BMI 30.0-34.9) 02/06/2021   Anxiety 01/20/2021   Tobacco abuse 01/20/2021   Shortness of breath 01/20/2021   Chest tightness      Current Outpatient Medications on File Prior to Visit  Medication Sig Dispense Refill   Accu-Chek Softclix Lancets lancets Use as instructed.  Check blood sugar daily before breakfast. 100 each 12   acetaminophen  (TYLENOL ) 500 MG tablet Take 1,000 mg by mouth 3 (three) times daily as needed for moderate pain or headache.     albuterol  (VENTOLIN  HFA) 108 (90 Base) MCG/ACT inhaler Inhale 2 puffs into the lungs every 6 (six) hours as needed for wheezing or shortness of breath. 18 g 1   ALPRAZolam  (XANAX ) 1 MG tablet Take 2 mg by mouth 3 (three) times daily.     alum & mag hydroxide-simeth (MAALOX/MYLANTA) 200-200-20 MG/5ML suspension Take 30 mLs by mouth every 4 (four) hours as needed for indigestion or heartburn. 355 mL 0   apixaban  (ELIQUIS ) 5 MG TABS tablet Take 1 tablet (5 mg total) by mouth 2 (two) times daily. 60 tablet 11   atorvastatin  (LIPITOR ) 80 MG tablet Take 1 tablet (80 mg total) by mouth daily. 90 tablet 3   Blood Glucose Monitoring Suppl (ACCU-CHEK GUIDE) w/Device KIT Check blood sugar once daily before breakfast 1 kit 0   buPROPion  (WELLBUTRIN  XL) 150 MG 24 hr tablet Take 150 mg by mouth daily.     cetirizine  (ZYRTEC  ALLERGY ) 10 MG tablet Take 1 tablet (10 mg total) by mouth 2 (two) times daily. 60 tablet 5   cyclobenzaprine  (FLEXERIL ) 10 MG tablet Take 10 mg by mouth 3 (three) times daily as needed for muscle spasms.     dapagliflozin  propanediol (FARXIGA ) 10 MG TABS tablet Take 1 tablet (10 mg total) by mouth daily before breakfast. 90 tablet 3   dicyclomine  (BENTYL ) 10 MG capsule Take 1 capsule (10 mg total) by mouth every 6 (six) hours as needed for spasms. 60 capsule 3   diphenoxylate -atropine  (LOMOTIL ) 2.5-0.025 MG tablet Take 2 tablets by mouth 4 (four) times daily as needed for diarrhea/ loose stools. 60 tablet 3   Evolocumab  (REPATHA   SURECLICK) 140 MG/ML SOAJ Inject 140 mg into the skin every 14 (fourteen) days. 6 mL 3   famotidine  (PEPCID ) 20 MG tablet Take 1 tablet (20 mg total) by mouth 2 (two) times daily. 60 tablet 5   feeding supplement (ENSURE ENLIVE / ENSURE PLUS) LIQD Take 237 mLs by mouth 2 (two) times daily between meals. 237 mL 12   Fezolinetant  (VEOZAH ) 45 MG TABS Take 1 tablet (45 mg total) by mouth daily. 30 tablet 6   fluticasone  (FLONASE ) 50 MCG/ACT nasal spray Place 1-2 sprays into both nostrils daily as needed (nasal congestion). 16 g 5   furosemide  (LASIX ) 20 MG tablet Take 2 tablets (40 mg total) by mouth 2 (two) times daily. 120 tablet 3   glucose blood test strip Use as instructed to check blood sugar daily before breakfast 100 each 6   ivabradine  (CORLANOR ) 5 MG TABS tablet Take 1/2 tablet (2.5 mg total) by mouth 2 (two) times daily. 30 tablet 5   lipase/protease/amylase (CREON ) 36000 UNITS CPEP capsule Take 1 capsule (36,000 Units total) by mouth in the morning, at noon, and at bedtime. 300 capsule 0   metroNIDAZOLE  (METROGEL ) 1 % gel Apply 1 Application topically daily as  needed (rash).     Multiple Vitamins-Minerals (MULTIVITAMIN WITH MINERALS) tablet Take 1 tablet by mouth daily.     oxymetazoline (AFRIN) 0.05 % nasal spray Place 1-2 sprays into both nostrils 2 (two) times daily as needed for congestion.     potassium chloride  SA (KLOR-CON  M) 20 MEQ tablet Take 4 tablets (80 mEq total) by mouth 2 (two) times daily. 240 tablet 3   promethazine  (PHENERGAN ) 25 MG tablet Take 1 tablet (25 mg total) by mouth every 6 (six) to 8 (eight) hours for nausea or vomiting. 90 tablet 2   sitaGLIPtin  (JANUVIA ) 50 MG tablet Take 1 tablet (50 mg total) by mouth daily. 90 tablet 1   triamcinolone  cream (KENALOG ) 0.1 % Apply 1 Application topically daily as needed (irritation).     valACYclovir  (VALTREX ) 500 MG tablet Take 1 tablet (500 mg total) by mouth 2 (two) times daily. 6 tablet 2   vedolizumab  (ENTYVIO ) 300 MG  injection Inject 300 mg into the vein every 2 (two) months.     zolpidem  (AMBIEN ) 10 MG tablet Take 10 mg by mouth at bedtime as needed.     [DISCONTINUED] Eszopiclone  3 MG TABS Take 1 tablet (3 mg total) by mouth at bedtime. Take immediately before bedtime 30 tablet 5   No current facility-administered medications on file prior to visit.    Allergies  Allergen Reactions   Gabapentin Diarrhea   Sulfa Antibiotics Hives, Itching and Rash   Erythromycin Hives   Tramadol  Rash and Other (See Comments)    Urinary retention     Ibuprofen Itching   Tape Rash    Social History   Socioeconomic History   Marital status: Single    Spouse name: Not on file   Number of children: 0   Years of education: 16   Highest education level: Bachelor's degree (e.g., BA, AB, BS)  Occupational History   Not on file  Tobacco Use   Smoking status: Former    Current packs/day: 0.00    Types: Cigarettes    Quit date: 01/15/2021    Years since quitting: 3.1    Passive exposure: Past   Smokeless tobacco: Never  Vaping Use   Vaping status: Never Used  Substance and Sexual Activity   Alcohol use: Not Currently   Drug use: Never   Sexual activity: Not on file  Other Topics Concern   Not on file  Social History Narrative   Not on file   Social Drivers of Health   Financial Resource Strain: Medium Risk (02/17/2024)   Overall Financial Resource Strain (CARDIA)    Difficulty of Paying Living Expenses: Somewhat hard  Food Insecurity: No Food Insecurity (02/17/2024)   Hunger Vital Sign    Worried About Running Out of Food in the Last Year: Never true    Ran Out of Food in the Last Year: Never true  Transportation Needs: No Transportation Needs (02/17/2024)   PRAPARE - Administrator, Civil Service (Medical): No    Lack of Transportation (Non-Medical): No  Physical Activity: Insufficiently Active (02/17/2024)   Exercise Vital Sign    Days of Exercise per Week: 4 days    Minutes of  Exercise per Session: 30 min  Stress: Stress Concern Present (02/17/2024)   [employer] of Occupational Health - Occupational Stress Questionnaire    Feeling of Stress: Rather much  Social Connections: Moderately Isolated (02/17/2024)   Social Connection and Isolation Panel    Frequency of Communication with Friends and Family:  More than three times a week    Frequency of Social Gatherings with Friends and Family: Twice a week    Attends Religious Services: 1 to 4 times per year    Active Member of [employer] or Organizations: No    Attends Engineer, Structural: Not on file    Marital Status: Never married  Intimate Partner Violence: Not At Risk (09/03/2023)   Humiliation, Afraid, Rape, and Kick questionnaire    Fear of Current or Ex-Partner: No    Emotionally Abused: No    Physically Abused: No    Sexually Abused: No    Family History  Problem Relation Age of Onset   Eczema Mother    Stroke Mother    Atrial fibrillation Mother    Heart failure Father    Asthma Brother    Stomach cancer Maternal Grandfather    Liver cancer Neg Hx    Esophageal cancer Neg Hx    Colon polyps Neg Hx    Colon cancer Neg Hx    Allergic rhinitis Neg Hx    Angioedema Neg Hx    Urticaria Neg Hx     Past Surgical History:  Procedure Laterality Date   ABDOMINAL HYSTERECTOMY     BIOPSY  02/26/2022   Procedure: BIOPSY;  Surgeon: San Sandor GAILS, DO;  Location: [facility];  Service: Gastroenterology;;   CARDIAC CATHETERIZATION     CHOLECYSTECTOMY     COLONOSCOPY WITH PROPOFOL  N/A 02/26/2022   Procedure: COLONOSCOPY WITH PROPOFOL ;  Surgeon: San Sandor GAILS, DO;  Location: [facility];  Service: Gastroenterology;  Laterality: N/A;   ESOPHAGOGASTRODUODENOSCOPY (EGD) WITH PROPOFOL  N/A 02/26/2022   Procedure: ESOPHAGOGASTRODUODENOSCOPY (EGD) WITH PROPOFOL ;  Surgeon: San Sandor GAILS, DO;  Location: [facility];  Service: Gastroenterology;  Laterality: N/A;   ICD IMPLANT N/A 07/21/2021    Procedure: ICD IMPLANT;  Surgeon: Fernande Elspeth BROCKS, MD;  Location: [facility] IN[facility]AS[facility] CV [facility];  Service: Cardiovascular;  Laterality: N/A;   NECK SURGERY     OVARIAN CYST SURGERY     POLYPECTOMY  02/26/2022   Procedure: POLYPECTOMY;  Surgeon: San Sandor GAILS, DO;  Location: [facility];  Service: Gastroenterology;;   RIGHT HEART CATH N/A 07/12/2023   Procedure: RIGHT HEART CATH;  Surgeon: Cherrie Toribio SAUNDERS, MD;  Location: [facility]AS[facility] CV LAB;  Service: Cardiovascular;  Laterality: N/A;   RIGHT/LEFT HEART CATH AND CORONARY ANGIOGRAPHY N/A 01/23/2021   Procedure: RIGHT/LEFT HEART CATH AND CORONARY ANGIOGRAPHY;  Surgeon: Cherrie Toribio SAUNDERS, MD;  Location: [facility] INVASIVE CV LAB;  Service: Cardiovascular;  Laterality: N/A;   TONSILLECTOMY      ROS: Review of Systems Negative except as stated above  PHYSICAL EXAM: BP 106/65 (BP Location: Left Arm, Patient Position: Sitting, Cuff Size: Normal)   Pulse 74   Temp 97.8 F (36.6 C) (Oral)   Ht 5' 10 (1.778 m)   Wt 175 lb (79.4 kg)   SpO2 99%   BMI 25.11 kg/m   Physical Exam  General appearance - alert, well appearing, and in no distress Mental status - normal mood, behavior, speech, dress, motor activity, and thought processes Neck - supple, no significant adenopathy Chest - clear to auscultation, no wheezes, rales or rhonchi, symmetric air entry Heart - normal rate, regular rhythm, normal S1, S2, no murmurs, rubs, clicks or gallops Extremities - peripheral pulses normal, no pedal edema, no clubbing or cyanosis     02/24/2024   11:29 AM 10/24/2023    3:17 PM 09/03/2023    2:16 PM  Depression screen PHQ 2/9  Decreased Interest 2 1 3   Down, Depressed, Hopeless 2 1 2   PHQ - 2 Score 4 2 5   Altered sleeping 3 3 2   Tired, decreased energy 3 3 3   Change in appetite 3 2 2   Feeling bad or failure about yourself  2 2 2   Trouble concentrating 3 3 3   Moving slowly or fidgety/restless 1 2 3   Suicidal thoughts 0 0 0  PHQ-9 Score 19 17  20     Difficult doing work/chores Somewhat difficult Somewhat difficult Somewhat difficult     Data saved with a previous flowsheet row definition       Latest Ref Rng & Units 12/15/2023   11:27 AM 11/06/2023   12:41 PM 09/03/2023    3:21 PM  CMP  Glucose 70 - 99 mg/dL 898  95  98   BUN 6 - 20 mg/dL 13  13  14    Creatinine 0.44 - 1.00 mg/dL 9.03  8.93  8.96   Sodium 135 - 145 mmol/L 142  138  140   Potassium 3.5 - 5.1 mmol/L 3.7  3.9  4.1   Chloride 98 - 111 mmol/L 107  101  105   CO2 22 - 32 mmol/L 22  24  15    Calcium  8.9 - 10.3 mg/dL 9.5  9.6  9.8   Total Protein 6.5 - 8.1 g/dL 7.0     Total Bilirubin 0.0 - 1.2 mg/dL 0.5     Alkaline Phos 38 - 126 U/L 131     AST 15 - 41 U/L 23     ALT 0 - 44 U/L 23      Lipid Panel     Component Value Date/Time   CHOL 174 10/24/2023 1626   TRIG 111 10/24/2023 1626   HDL 42 10/24/2023 1626   CHOLHDL 4.1 10/24/2023 1626   CHOLHDL 5.1 03/01/2022 0339   VLDL 44 (H) 03/01/2022 0339   LDLCALC 112 (H) 10/24/2023 1626    CBC    Component Value Date/Time   WBC 7.2 12/15/2023 1127   RBC 4.81 12/15/2023 1127   HGB 13.9 12/15/2023 1127   HGB 16.0 (H) 04/22/2023 1356   HCT 41.0 12/15/2023 1127   HCT 47.9 (H) 04/22/2023 1356   PLT 281 12/15/2023 1127   PLT 361 04/22/2023 1356   MCV 85.2 12/15/2023 1127   MCV 84 04/22/2023 1356   MCH 28.9 12/15/2023 1127   MCHC 33.9 12/15/2023 1127   RDW 13.5 12/15/2023 1127   RDW 12.5 04/22/2023 1356   LYMPHSABS 2.0 12/15/2023 1127   LYMPHSABS 1.6 04/22/2023 1356   MONOABS 0.4 12/15/2023 1127   EOSABS 0.2 12/15/2023 1127   EOSABS 0.5 (H) 04/22/2023 1356   BASOSABS 0.0 12/15/2023 1127   BASOSABS 0.1 04/22/2023 1356   Results for orders placed or performed in visit on 02/24/24  POCT glucose (manual entry)   Collection Time: 02/24/24 12:10 PM  Result Value Ref Range   POC Glucose 132 (A) 70 - 99 mg/dl  POCT glycosylated hemoglobin (Hb A1C)   Collection Time: 02/24/24 12:15 PM  Result Value Ref Range    Hemoglobin A1C     HbA1c POC (<> result, manual entry)     HbA1c, POC (prediabetic range)     HbA1c, POC (controlled diabetic range) 5.9 0.0 - 7.0 %   *Note: Due to a large number of results and/or encounters for the requested time period, some results have not been displayed. A complete set of  results can be found in Results Review.    ASSESSMENT AND PLAN: 1. Diabetes mellitus treated with oral medication (HCC) (Primary) At goal Continue healthy eating habits and regular exercise Continue Januvia  and Farxiga  - POCT glucose (manual entry) - POCT glycosylated hemoglobin (Hb A1C)  2. Cold intolerance I expressed understanding about her frustration with not having answers for this.  I do not think it is due to menopause.  I stated before her thyroid  level and CBC are okay.  Recommend bundling up and keeping temperature at home in a range that is best for her.  3. Chronic systolic CHF (congestive heart failure) (HCC) Stable Continue Farxiga  anf Furosemide   4. Coronary artery disease involving native coronary artery of native heart without angina pectoris Clinically stable. Continue atorvastatin  and Repatha   5. Hyperlipidemia associated with type 2 diabetes mellitus (HCC) See #4 above Keep f/u with cardiology next mth for recheck  6. Major depressive disorder, recurrent episode, moderate (HCC) Plugged in with BH. Reports recent increase in Wellbutrin  dose  7. Need for immunization against influenza - Flu vaccine trivalent PF, 6mos and older(Flulaval,Afluria,Fluarix,Fluzone)  8. OSA on CPAP Continue to use her CPAP machine daily.  Pt to continue to hold off on taking Vit D as recommended by endocrinology  Patient was given the opportunity to ask questions.  Patient verbalized understanding of the plan and was able to repeat key elements of the plan.   This documentation was completed using Paediatric nurse.  Any transcriptional errors are  unintentional.  Orders Placed This Encounter  Procedures   Flu vaccine trivalent PF, 6mos and older(Flulaval,Afluria,Fluarix,Fluzone)     Requested Prescriptions    No prescriptions requested or ordered in this encounter    No follow-ups on file.  Barnie Louder, MD, FACP

## 2024-03-05 ENCOUNTER — Other Ambulatory Visit: Payer: Self-pay | Admitting: Gastroenterology

## 2024-03-06 ENCOUNTER — Other Ambulatory Visit (HOSPITAL_COMMUNITY): Payer: Self-pay

## 2024-03-06 ENCOUNTER — Telehealth: Payer: Self-pay | Admitting: Gastroenterology

## 2024-03-06 ENCOUNTER — Other Ambulatory Visit: Payer: Self-pay

## 2024-03-06 ENCOUNTER — Encounter (HOSPITAL_COMMUNITY): Payer: Self-pay | Admitting: Gastroenterology

## 2024-03-06 ENCOUNTER — Other Ambulatory Visit: Payer: Self-pay | Admitting: Gastroenterology

## 2024-03-06 MED ORDER — PROMETHAZINE HCL 25 MG PO TABS
25.0000 mg | ORAL_TABLET | Freq: Four times a day (QID) | ORAL | 2 refills | Status: DC
  Filled 2024-03-06: qty 90, 23d supply, fill #0
  Filled 2024-03-27: qty 90, 23d supply, fill #1
  Filled 2024-04-16: qty 90, 23d supply, fill #2

## 2024-03-06 NOTE — Telephone Encounter (Signed)
 Electronic refill requests for lomotil  and phenergan  were not received until 3:05 pm today, 03/06/24. Phenergan  refill has already been sent to pharmacy electronically. Lomotil  rx will need to be manually faxed since it is considered a controlled substance.

## 2024-03-06 NOTE — Telephone Encounter (Signed)
 Inbound call from patient stating that she spoke with a pharmacist today during lunch at they did not have the refills for her Phenergan  25 MG and her Lomotil . Please advise.

## 2024-03-09 ENCOUNTER — Other Ambulatory Visit (HOSPITAL_COMMUNITY): Payer: Self-pay

## 2024-03-09 MED FILL — Diphenoxylate w/ Atropine Tab 2.5-0.025 MG: ORAL | 8 days supply | Qty: 60 | Fill #0 | Status: AC

## 2024-03-10 NOTE — Progress Notes (Deleted)
 Office Visit Note  Patient: Christine [name]             Date of Birth: 08-19-1965           MRN: [id number]             PCP: Vicci Barnie NOVAK, MD Referring: Vicci Barnie NOVAK, MD Visit Date: 03/23/2024   Subjective:  No chief complaint on file.   History of Present Illness: Christine [name] is a 58 y.o. female here for follow up n her symptoms and abnormal lab results.   Previous HPI 06/19/2023 Christine [name] is a 58 year old female with skin inflammation and positive ANA who presents for follow-up on her symptoms and abnormal lab results.   She has experienced significant improvement in her skin inflammation since the last visit. There are no new outbreaks, and her arms have cleared up. Her legs, although still unsightly, are not itching, and there are no new lesions. She is on a high dose of antihistamines, taking two Zyrtec  and Pepcid , which she believes may contribute to her fatigue.   Her finger discoloration has improved, but her hands and feet still get extremely cold, especially at night. Wearing socks to bed helps alleviate the cold sensation. She wakes up with cold hands and feet but finds it manageable. No swelling is reported.   She has a history of a low positive RNP antibody result and a positive ANA test. Her sedimentation rate was not elevated at the time of testing.   She is on metoprolol  for her heart condition, with no recent changes in dosage. She has been on Entyvio  for about a year for her gastrointestinal symptoms, which have shown significant improvement. Previously, she experienced severe diarrhea that restricted her ability to leave the house, but now she has better control over her symptoms.   She experiences persistent fatigue, which she attributes to multiple factors, including her heart medication, Crohn's disease, and possibly diabetes.      Previous HPI 02/21/23 Christine [name] is a 58 y.o. female here for evaluation of raynaud's symptoms with  a history of diffuse rashes and complicated crohn's disease on entyvio .  Original Crohn's starting May 2023 with loose watery stools found to have ileitis and distal ileum stricture.  Does have significant medical history including congestive heart failure (LVEF < 20%). She has been dealing with diffuse rashes ongoing since around March of this year.  These have broken out in several different areas but the most prominent being a large flat erythematous patch on the left thigh lasting for hours at a time.  Primarily itching sometimes with burning sensation.  Did not notice any difference in symptoms before versus after starting Entyvio  treatment.  She did not see any resolution with prednisone .  So far she is try to treat treatments including loratadine  and Benadryl were not helpful for the visible rash or the itching.  She did see dermatology for a skin biopsy apparently this indicated some type of inflammation and was prescribed topical triamcinolone  as a treatment.  Was not recommended any other systemic therapy and she is not sure about specific diagnosis.  She did have some lab test including antibody test with negative Sjogren syndrome markers. Besides these rashes she has been seeing increased frequency of toe discoloration becoming blue with numbness and tingling sensation.  Not associated with any visible ulcers or pitting. Also noted to have mild eosinophilia in July labs and appointment with allergy  clinic in December.   No Rheumatology  ROS completed.   PMFS History:  Patient Active Problem List   Diagnosis Date Noted   Hyperlipidemia LDL goal <55 12/23/2023   Hypotension 08/25/2023   Cardiogenic shock (HCC) 08/24/2023   Abnormal laboratory test result 06/26/2023   Crohn's disease involving terminal ileum (HCC) 07/23/2022   Crohn's disease of large intestine with other complication (HCC) 05/11/2022   Mild major depression 05/11/2022   Paroxysmal tachycardia, unspecified (HCC) 05/11/2022    Acute pancreatitis 03/01/2022   Hypokalemia 03/01/2022   Ileitis 03/01/2022   Diarrhea 02/26/2022   Nausea without vomiting 02/26/2022   Loss of weight 02/26/2022   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   Ulcer of ileum 02/26/2022   Diverticulosis of colon without hemorrhage 02/26/2022   Cardiomyopathy (HCC) ischemic and non-ischemic 01/16/2022   Abdominal aortic aneurysm (AAA) 3.0 cm to 5.0 cm in diameter in female 01/09/2022   Polycythemia 01/09/2022   Chronic combined systolic and diastolic heart failure (HCC) 08/24/2021   CAD in native artery 08/24/2021   S/P ICD (internal cardiac defibrillator) procedure 08/10/2021   Obstructive sleep apnea 05/05/2021   Former smoker 02/06/2021   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   Obesity (BMI 30.0-34.9) 02/06/2021   Anxiety 01/20/2021   Tobacco abuse 01/20/2021   Shortness of breath 01/20/2021   Chest tightness     Past Medical History:  Diagnosis Date   Allergy     Anxiety 01/20/2021   CAD in native artery 08/24/2021   CHF (congestive heart failure) (HCC)    Chronic combined systolic and diastolic heart failure (HCC) 08/24/2021   Complication of anesthesia    Crohn's colitis (HCC)    Depression    Hyperlipidemia    Myocardial infarction (HCC)    Presence of cardiac defibrillator 07/2021   Shortness of breath 01/20/2021   Sleep apnea 07/2021   Tobacco abuse 01/20/2021   Urticaria     Family History  Problem Relation Age of Onset   Eczema Mother    Stroke Mother    Atrial fibrillation Mother    Heart failure Father    Asthma Brother    Stomach cancer Maternal Grandfather    Liver cancer Neg Hx    Esophageal cancer Neg Hx    Colon polyps Neg Hx    Colon cancer Neg Hx    Allergic rhinitis Neg Hx    Angioedema Neg Hx    Urticaria Neg Hx    Past Surgical History:  Procedure Laterality Date   ABDOMINAL HYSTERECTOMY     BIOPSY  02/26/2022   Procedure: BIOPSY;  Surgeon: San Sandor GAILS, DO;  Location: [facility];   Service: Gastroenterology;;   CARDIAC CATHETERIZATION     CHOLECYSTECTOMY     COLONOSCOPY WITH PROPOFOL  N/A 02/26/2022   Procedure: COLONOSCOPY WITH PROPOFOL ;  Surgeon: San Sandor GAILS, DO;  Location: [facility];  Service: Gastroenterology;  Laterality: N/A;   ESOPHAGOGASTRODUODENOSCOPY (EGD) WITH PROPOFOL  N/A 02/26/2022   Procedure: ESOPHAGOGASTRODUODENOSCOPY (EGD) WITH PROPOFOL ;  Surgeon: San Sandor GAILS, DO;  Location: [facility];  Service: Gastroenterology;  Laterality: N/A;   ICD IMPLANT N/A 07/21/2021   Procedure: ICD IMPLANT;  Surgeon: Fernande Elspeth BROCKS, MD;  Location: [facility] INVASIVE CV LAB;  Service: Cardiovascular;  Laterality: N/A;   NECK SURGERY     OVARIAN CYST SURGERY     POLYPECTOMY  02/26/2022   Procedure: POLYPECTOMY;  Surgeon: San Sandor GAILS, DO;  Location: [facility];  Service: Gastroenterology;;   RIGHT HEART CATH N/A 07/12/2023   Procedure: RIGHT HEART CATH;  Surgeon: Cherrie,  Toribio SAUNDERS, MD;  Location: [facility]VAS[facility] CV LAB;  Service: Cardiovascular;  Laterality: N/A;   RIGHT/LEFT HEART CATH AND CORONARY ANGIOGRAPHY N/A 01/23/2021   Procedure: RIGHT/LEFT HEART CATH AND CORONARY ANGIOGRAPHY;  Surgeon: Cherrie Toribio SAUNDERS, MD;  Location: [facility] INVASIVE CV LAB;  Service: Cardiovascular;  Laterality: N/A;   TONSILLECTOMY     Social History   Social History Narrative   Not on file   Immunization History  Administered Date(s) Administered   Hepb-cpg 10/04/2022, 11/06/2022   Influenza, Seasonal, Injecte, Preservative Fre 01/15/2023, 02/24/2024   Influenza,inj,Quad PF,6+ Mos 02/06/2021, 01/09/2022   PFIZER(Purple Top)SARS-COV-2 Vaccination 08/29/2019, 09/23/2019, 04/02/2020   PNEUMOCOCCAL CONJUGATE-20 02/06/2021   Tdap 09/05/2021   Zoster Recombinant(Shingrix ) 05/11/2022, 01/29/2023     Objective: Vital Signs: There were no vitals taken for this visit.   Physical Exam   Musculoskeletal Exam: ***  CDAI Exam: CDAI Score: -- Patient Global: --; Provider  Global: -- Swollen: --; Tender: -- Joint Exam 03/23/2024   No joint exam has been documented for this visit   There is currently no information documented on the homunculus. Go to the Rheumatology activity and complete the homunculus joint exam.  Investigation: No additional findings.  Imaging: No results found.  Recent Labs: Lab Results  Component Value Date   WBC 7.2 12/15/2023   HGB 13.9 12/15/2023   PLT 281 12/15/2023   NA 142 12/15/2023   K 3.7 12/15/2023   CL 107 12/15/2023   CO2 22 12/15/2023   GLUCOSE 101 (H) 12/15/2023   BUN 13 12/15/2023   CREATININE 0.96 12/15/2023   BILITOT 0.5 12/15/2023   ALKPHOS 131 (H) 12/15/2023   AST 23 12/15/2023   ALT 23 12/15/2023   PROT 7.0 12/15/2023   ALBUMIN 4.3 12/15/2023   CALCIUM  9.5 12/15/2023   GFRAA  03/31/2008    >60        The eGFR has been calculated using the MDRD equation. This calculation has not been validated in all clinical   QFTBGOLDPLUS Negative 08/15/2023    Speciality Comments: No specialty comments available.  Procedures:  No procedures performed Allergies: Gabapentin, Sulfa antibiotics, Erythromycin, Tramadol , Ibuprofen, and Tape   Assessment / Plan:     Visit Diagnoses: No diagnosis found.  ***  Orders: No orders of the defined types were placed in this encounter.  No orders of the defined types were placed in this encounter.    Follow-Up Instructions: No follow-ups on file.   Nora Sabey M Zauria Dombek, CMA  Note - This record has been created using [employer].  Chart creation errors have been sought, but may not always  have been located. Such creation errors do not reflect on  the standard of medical care.

## 2024-03-13 ENCOUNTER — Other Ambulatory Visit (HOSPITAL_COMMUNITY): Payer: Self-pay | Admitting: Cardiology

## 2024-03-13 ENCOUNTER — Other Ambulatory Visit (HOSPITAL_COMMUNITY): Payer: Self-pay

## 2024-03-13 ENCOUNTER — Telehealth (HOSPITAL_COMMUNITY): Payer: Self-pay | Admitting: Pharmacist

## 2024-03-13 ENCOUNTER — Other Ambulatory Visit: Payer: Self-pay

## 2024-03-16 ENCOUNTER — Other Ambulatory Visit (HOSPITAL_COMMUNITY): Payer: Self-pay

## 2024-03-16 ENCOUNTER — Telehealth (HOSPITAL_COMMUNITY): Payer: Self-pay

## 2024-03-16 ENCOUNTER — Encounter (HOSPITAL_COMMUNITY): Payer: Self-pay | Admitting: Gastroenterology

## 2024-03-16 MED ORDER — FUROSEMIDE 20 MG PO TABS
40.0000 mg | ORAL_TABLET | Freq: Two times a day (BID) | ORAL | 3 refills | Status: AC
  Filled 2024-03-16: qty 120, 30d supply, fill #0
  Filled 2024-04-08: qty 120, 30d supply, fill #1
  Filled 2024-04-30: qty 120, 30d supply, fill #2
  Filled 2024-05-20: qty 120, 30d supply, fill #3

## 2024-03-16 NOTE — Telephone Encounter (Signed)
 Advanced Heart Failure Patient Advocate Encounter  Prior authorization for Farxiga  has been submitted and approved. Test billing returns $4 for 90 day supply.  Key: [id number] Effective: 03/16/2024 to 03/16/2025  Rachel DEL, CPhT Rx Patient Advocate Phone: [phone]

## 2024-03-23 ENCOUNTER — Ambulatory Visit: Admitting: Internal Medicine

## 2024-03-23 DIAGNOSIS — R899 Unspecified abnormal finding in specimens from other organs, systems and tissues: Secondary | ICD-10-CM

## 2024-03-25 ENCOUNTER — Ambulatory Visit: Attending: Cardiology | Admitting: Internal Medicine

## 2024-03-25 ENCOUNTER — Encounter: Payer: Self-pay | Admitting: Internal Medicine

## 2024-03-25 VITALS — BP 93/60 | HR 88 | Temp 96.6°F | Resp 16 | Ht 70.0 in | Wt 175.8 lb

## 2024-03-25 DIAGNOSIS — I951 Orthostatic hypotension: Secondary | ICD-10-CM | POA: Insufficient documentation

## 2024-03-25 DIAGNOSIS — R899 Unspecified abnormal finding in specimens from other organs, systems and tissues: Secondary | ICD-10-CM | POA: Insufficient documentation

## 2024-03-25 DIAGNOSIS — I7300 Raynaud's syndrome without gangrene: Secondary | ICD-10-CM | POA: Diagnosis not present

## 2024-03-25 NOTE — Patient Instructions (Signed)
 [email or web address] this contains some exercises targetting to conditioning the vascular system to tolerate changes in posture over time. I do not think you need to follow the specific regimen or times but may benefit adding these exercises. I also recommend checking out the [email or web address] webpage for additional references and materials.  I am checking you for the previous positive RNP antibodies and sedimentation rate for evidence of an ongoing inflammation process or not.

## 2024-03-25 NOTE — Progress Notes (Signed)
 "  Office Visit Note  Patient: Christine [name]             Date of Birth: 06/18/1965           MRN: [id number]             PCP: Vicci Barnie NOVAK, MD Referring: Vicci Barnie NOVAK, MD Visit Date: 03/25/2024   Subjective:  Medical Management of Chronic Issues (Cold chills, hot flashes , cold feet and hands internal shivering even with hot temperatures . This started in April. Patient used Chat GPT and has concerns about Dysautonomia )   Discussed the use of AI scribe software for clinical note transcription with the patient, who gave verbal consent to proceed.  History of Present Illness   Christine [name] is a 58 year old female with Crohn's disease and heart failure who presents with symptoms of dysautonomia, including cold intolerance and dizziness.  She has been experiencing extreme coldness followed by hot flashes since April, with persistent cold hands and feet regardless of external temperature, described as 'internal shivering.'  She experiences dizziness upon standing, feeling like she might pass out, which has been ongoing for years. She has a history of peripheral circulation problems with her feet turning reddish-purplish at night and her hands appearing pink in the mornings.  Her current medications include Corlanor , Lasix , and Eliquis . She was previously on spironolactone  but was taken off due to low blood pressure. She also had a past high vitamin D  level after being on 50,000 units weekly for a deficiency, which was discontinued in October after endocrinology consultation.  She has not had any flare-ups requiring steroids since early last year. She has been exercising regularly, using a recumbent elliptical and biking, which she started in July.  No recent viral illnesses or need for antibiotics. She expresses frustration with being passed between doctors without a clear diagnosis or treatment plan for her symptoms.       Previous HPI 06/19/2023 Christine [name] is a 59 year old female with skin inflammation and positive ANA who presents for follow-up on her symptoms and abnormal lab results.   She has experienced significant improvement in her skin inflammation since the last visit. There are no new outbreaks, and her arms have cleared up. Her legs, although still unsightly, are not itching, and there are no new lesions. She is on a high dose of antihistamines, taking two Zyrtec  and Pepcid , which she believes may contribute to her fatigue.   Her finger discoloration has improved, but her hands and feet still get extremely cold, especially at night. Wearing socks to bed helps alleviate the cold sensation. She wakes up with cold hands and feet but finds it manageable. No swelling is reported.   She has a history of a low positive RNP antibody result and a positive ANA test. Her sedimentation rate was not elevated at the time of testing.   She is on metoprolol  for her heart condition, with no recent changes in dosage. She has been on Entyvio  for about a year for her gastrointestinal symptoms, which have shown significant improvement. Previously, she experienced severe diarrhea that restricted her ability to leave the house, but now she has better control over her symptoms.   She experiences persistent fatigue, which she attributes to multiple factors, including her heart medication, Crohn's disease, and possibly diabetes.      Previous HPI 02/21/23 Christine [name] is a 58 y.o. female here for evaluation of raynaud's symptoms with a history  of diffuse rashes and complicated crohn's disease on entyvio .  Original Crohn's starting May 2023 with loose watery stools found to have ileitis and distal ileum stricture.  Does have significant medical history including congestive heart failure (LVEF < 20%). She has been dealing with diffuse rashes ongoing since around March of this year.  These have broken out in several different areas but the most prominent being  a large flat erythematous patch on the left thigh lasting for hours at a time.  Primarily itching sometimes with burning sensation.  Did not notice any difference in symptoms before versus after starting Entyvio  treatment.  She did not see any resolution with prednisone .  So far she is try to treat treatments including loratadine  and Benadryl were not helpful for the visible rash or the itching.  She did see dermatology for a skin biopsy apparently this indicated some type of inflammation and was prescribed topical triamcinolone  as a treatment.  Was not recommended any other systemic therapy and she is not sure about specific diagnosis.  She did have some lab test including antibody test with negative Sjogren syndrome markers. Besides these rashes she has been seeing increased frequency of toe discoloration becoming blue with numbness and tingling sensation.  Not associated with any visible ulcers or pitting. Also noted to have mild eosinophilia in July labs and appointment with allergy  clinic in December.   Review of Systems  Constitutional:  Positive for fatigue.  HENT:  Positive for mouth sores and mouth dryness.   Eyes:  Negative for dryness.  Respiratory:  Positive for shortness of breath.   Cardiovascular:  Positive for palpitations. Negative for chest pain.  Gastrointestinal:  Positive for diarrhea. Negative for blood in stool and constipation.  Endocrine: Negative for increased urination.  Genitourinary:  Negative for involuntary urination.  Musculoskeletal:  Negative for joint pain, gait problem, joint pain, joint swelling, myalgias, muscle weakness, morning stiffness, muscle tenderness and myalgias.  Skin:  Positive for hair loss and sensitivity to sunlight. Negative for color change and rash.  Allergic/Immunologic: Positive for susceptible to infections.  Neurological:  Positive for dizziness and headaches.  Hematological:  Negative for swollen glands.  Psychiatric/Behavioral:  Positive  for depressed mood and sleep disturbance. The patient is nervous/anxious.     PMFS History:  Patient Active Problem List   Diagnosis Date Noted   Orthostatic intolerance 03/25/2024   Hyperlipidemia LDL goal <55 12/23/2023   Hypotension 08/25/2023   Cardiogenic shock (HCC) 08/24/2023   Abnormal laboratory test result 06/26/2023   Crohn's disease involving terminal ileum (HCC) 07/23/2022   Crohn's disease of large intestine with other complication (HCC) 05/11/2022   Mild major depression 05/11/2022   Paroxysmal tachycardia, unspecified (HCC) 05/11/2022   Acute pancreatitis 03/01/2022   Hypokalemia 03/01/2022   Ileitis 03/01/2022   Diarrhea 02/26/2022   Nausea without vomiting 02/26/2022   Loss of weight 02/26/2022   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   Ulcer of ileum 02/26/2022   Diverticulosis of colon without hemorrhage 02/26/2022   Cardiomyopathy (HCC) ischemic and non-ischemic 01/16/2022   Abdominal aortic aneurysm (AAA) 3.0 cm to 5.0 cm in diameter in female 01/09/2022   Polycythemia 01/09/2022   Chronic combined systolic and diastolic heart failure (HCC) 08/24/2021   CAD in native artery 08/24/2021   S/P ICD (internal cardiac defibrillator) procedure 08/10/2021   Obstructive sleep apnea 05/05/2021   Former smoker 02/06/2021   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   Obesity (BMI 30.0-34.9) 02/06/2021   Anxiety 01/20/2021   Tobacco abuse  01/20/2021   Shortness of breath 01/20/2021   Chest tightness     Past Medical History:  Diagnosis Date   Allergy     Anxiety 01/20/2021   CAD in native artery 08/24/2021   CHF (congestive heart failure) (HCC)    Chronic combined systolic and diastolic heart failure (HCC) 08/24/2021   Complication of anesthesia    Crohn's colitis (HCC)    Depression    Hyperlipidemia    Myocardial infarction (HCC)    Presence of cardiac defibrillator 07/2021   Shortness of breath 01/20/2021   Sleep apnea 07/2021   Tobacco abuse 01/20/2021    Urticaria     Family History  Problem Relation Age of Onset   Eczema Mother    Stroke Mother    Atrial fibrillation Mother    Heart failure Father    Asthma Brother    Stomach cancer Maternal Grandfather    Liver cancer Neg Hx    Esophageal cancer Neg Hx    Colon polyps Neg Hx    Colon cancer Neg Hx    Allergic rhinitis Neg Hx    Angioedema Neg Hx    Urticaria Neg Hx    Past Surgical History:  Procedure Laterality Date   ABDOMINAL HYSTERECTOMY     BIOPSY  02/26/2022   Procedure: BIOPSY;  Surgeon: San Sandor GAILS, DO;  Location: [facility];  Service: Gastroenterology;;   CARDIAC CATHETERIZATION     CHOLECYSTECTOMY     COLONOSCOPY WITH PROPOFOL  N/A 02/26/2022   Procedure: COLONOSCOPY WITH PROPOFOL ;  Surgeon: San Sandor GAILS, DO;  Location: [facility];  Service: Gastroenterology;  Laterality: N/A;   ESOPHAGOGASTRODUODENOSCOPY (EGD) WITH PROPOFOL  N/A 02/26/2022   Procedure: ESOPHAGOGASTRODUODENOSCOPY (EGD) WITH PROPOFOL ;  Surgeon: San Sandor GAILS, DO;  Location: [facility];  Service: Gastroenterology;  Laterality: N/A;   ICD IMPLANT N/A 07/21/2021   Procedure: ICD IMPLANT;  Surgeon: Fernande Elspeth BROCKS, MD;  Location: [facility] INVASIVE CV LAB;  Service: Cardiovascular;  Laterality: N/A;   NECK SURGERY     OVARIAN CYST SURGERY     POLYPECTOMY  02/26/2022   Procedure: POLYPECTOMY;  Surgeon: San Sandor GAILS, DO;  Location: [facility];  Service: Gastroenterology;;   RIGHT HEART CATH N/A 07/12/2023   Procedure: RIGHT HEART CATH;  Surgeon: Cherrie Toribio SAUNDERS, MD;  Location: [facility] INVASIVE CV LAB;  Service: Cardiovascular;  Laterality: N/A;   RIGHT/LEFT HEART CATH AND CORONARY ANGIOGRAPHY N/A 01/23/2021   Procedure: RIGHT/LEFT HEART CATH AND CORONARY ANGIOGRAPHY;  Surgeon: Cherrie Toribio SAUNDERS, MD;  Location: [facility] INVAS[facility] CV LAB;  Service: Cardiovascular;  Laterality: N/A;   TONSILLECTOMY     Social History   Social History Narrative   Not on file   Immunization History   Administered Date(s) Administered   Hepb-cpg 10/04/2022, 11/06/2022   Influenza, Seasonal, Injecte, Preservative Fre 01/15/2023, 02/24/2024   Influenza,inj,Quad PF,6+ Mos 02/06/2021, 01/09/2022   PFIZER(Purple Top)SARS-COV-2 Vaccination 08/29/2019, 09/23/2019, 04/02/2020   PNEUMOCOCCAL CONJUGATE-20 02/06/2021   Tdap 09/05/2021   Zoster Recombinant(Shingrix ) 05/11/2022, 01/29/2023     Objective: Vital Signs: BP 93/60   Pulse 88   Temp (!) 96.6 F (35.9 C)   Resp 16   Ht 5' 10 (1.778 m)   Wt 175 lb 12.8 oz (79.7 kg)   BMI 25.22 kg/m    Physical Exam Eyes:     Conjunctiva/sclera: Conjunctivae normal.  Cardiovascular:     Rate and Rhythm: Normal rate and regular rhythm.  Pulmonary:     Effort: Pulmonary effort is normal.  Breath sounds: Normal breath sounds.  Skin:    General: Skin is warm and dry.     Findings: No rash.  Neurological:     Mental Status: She is alert.  Psychiatric:        Mood and Affect: Mood normal.      Musculoskeletal Exam:  Shoulders full ROM no tenderness or swelling Elbows full ROM no tenderness or swelling Wrists full ROM no tenderness or swelling Fingers full ROM no tenderness or swelling Knees full ROM no tenderness or swelling   Investigation: No additional findings.  Imaging: No results found.  Recent Labs: Lab Results  Component Value Date   WBC 7.7 04/02/2024   HGB 14.1 04/02/2024   PLT 310 04/02/2024   NA 139 04/02/2024   K 4.5 04/02/2024   CL 107 04/02/2024   CO2 25 04/02/2024   GLUCOSE 103 (H) 04/02/2024   BUN 11 04/02/2024   CREATININE 0.94 04/02/2024   BILITOT 0.5 12/15/2023   ALKPHOS 131 (H) 12/15/2023   AST 23 12/15/2023   ALT 23 12/15/2023   PROT 7.0 12/15/2023   ALBUMIN 4.3 12/15/2023   CALCIUM  9.4 04/02/2024   GFRAA  03/31/2008    >60        The eGFR has been calculated using the MDRD equation. This calculation has not been validated in all clinical   QFTBGOLDPLUS Negative 08/15/2023     Speciality Comments: No specialty comments available.  Procedures:  No procedures performed Allergies: Gabapentin, Sulfa antibiotics, Erythromycin, Tramadol , Ibuprofen, and Tape   Assessment / Plan:     Visit Diagnoses: Abnormal laboratory test result - Plan: Sedimentation rate, C-reactive protein, RNP Antibody Previous RNP antibody positive at 3.0, indicating possible systemic connective tissue disease. Differential includes reactive antibody versus true disease. - Ordered RNP antibody test to reassess levels. - Consider DMARD medication for Raynaud's if RNP antibody levels elevated.   Orthostatic intolerance Raynaud's syndrome without gangrene - Plan: Sedimentation rate, C-reactive protein, RNP Antibody Chronic Raynaud's with cold extremities, discoloration, and shivering. Symptoms exacerbated by cold, complicated by low blood pressure and polypharmacy. Vasodilator medication use limited with other dysautonomia symptoms. - Ordered RNP antibody test for systemic connective tissue disease evaluation. - Reviewed cardiology notes for POTS assessment. - Provided 'Standing Up to POTS' exercise program information.  Crohn's Disease Managed with Entyvio  infusions. Improvement in GI symptoms noted. -Continue current regimen.   Orders: Orders Placed This Encounter  Procedures   Sedimentation rate   C-reactive protein   RNP Antibody   No orders of the defined types were placed in this encounter.    Follow-Up Instructions: Return if symptoms worsen or fail to improve.   Lonni LELON Ester, MD  Note - This record has been created using [employer].  Chart creation errors have been sought, but may not always  have been located. Such creation errors do not reflect on  the standard of medical care. "

## 2024-03-26 ENCOUNTER — Ambulatory Visit (HOSPITAL_COMMUNITY)
Admission: RE | Admit: 2024-03-26 | Discharge: 2024-03-26 | Disposition: A | Discharge status: Home / Self Care | Source: Ambulatory Visit | Attending: Gastroenterology | Admitting: Gastroenterology

## 2024-03-26 VITALS — BP 114/77 | HR 97 | Temp 97.9°F | Resp 16

## 2024-03-26 DIAGNOSIS — K5000 Crohn's disease of small intestine without complications: Secondary | ICD-10-CM | POA: Diagnosis not present

## 2024-03-26 LAB — SEDIMENTATION RATE: Sed Rate: 17 mm/h (ref 0–30)

## 2024-03-26 LAB — C-REACTIVE PROTEIN: CRP: <3 mg/L (ref <8.0)

## 2024-03-26 LAB — RNP ANTIBODY: Ribonucleic Protein(ENA) Antibody, IgG: 2.2 AI — AB

## 2024-03-26 MED ORDER — VEDOLIZUMAB 300 MG IV SOLR
300.0000 mg | Freq: Once | INTRAVENOUS | Status: AC
  Administered 2024-03-26: 300 mg via INTRAVENOUS
  Filled 2024-03-26: qty 5

## 2024-03-26 MED ORDER — SODIUM CHLORIDE 0.9 % IV SOLN: INTRAVENOUS | Status: DC

## 2024-03-28 ENCOUNTER — Other Ambulatory Visit (HOSPITAL_COMMUNITY): Payer: Self-pay

## 2024-03-30 ENCOUNTER — Ambulatory Visit: Attending: Cardiology

## 2024-03-30 DIAGNOSIS — I5042 Chronic combined systolic (congestive) and diastolic (congestive) heart failure: Secondary | ICD-10-CM | POA: Diagnosis not present

## 2024-03-30 DIAGNOSIS — Z95810 Presence of automatic (implantable) cardiac defibrillator: Secondary | ICD-10-CM | POA: Diagnosis not present

## 2024-04-01 NOTE — Progress Notes (Cosign Needed)
 EPIC Encounter for ICM Monitoring  Patient Name: Christine [name] is a 58 y.o. female Date: 04/01/2024 Primary Care Physican: Vicci Barnie NOVAK, MD Primary Cardiologist: [place]/Bensimhon Electrophysiologist: Kennyth 05/27/2023 Weight: 182 - 183 lbs (baseline)          06/24/2023 Weight: 181 lbs 06/26/2023 Weight: 188 lbs          08/02/2023 Weight: 184-185 lbs  09/13/2023 Weight: 184 lbs   (6 lbs less than hospitalization)     10/24/2023 Office Weight: 183 lbs 12/06/2023 Weight: 182 lbs   01/16/2024 Weight: 176-178 lbs                          Spoke with patient and heart failure questions reviewed.  Transmission results reviewed.  Pt asymptomatic for fluid accumulation.  She has felt fluttering in her chest and will discuss with Dr Cherrie tomorrow.    HeartLogic Heart Failure Index 2 suggesting fluid levels are within normal threshold range.  Thoracic Impedance trending low 03/16/2024 suggesting possible fluid accumulation for a couple of days.    Prescribed:  Furosemide  20 mg take 2 tablet(s) (40 mg total) by mouth twice a day Potassium 20 mEq take 4 tablet(s) (80 mEq total) by mouth two (2) times a day Spironolactone  25 mg take 1 tablet by mouth daily.    Labs: 12/15/2023 Creatinine 0.96, BUN 13, Potassium 3.7, Sodium 142, >60 11/06/2023 Creatinine 1.06, BUN 13, Potassium 3.9, Sodium 138, GFR >60 09/03/2023 Creatinine 1.03, BUN 14, Potassium 4.1, Sodium 140  08/26/2023 Creatinine 1.08, BUN 15, Potassium 3.1, Sodium 136, GFR >60  08/25/2023 Creatinine 1.32, BUN 17, Potassium 4.9, Sodium 135, GFR 48  08/24/2023 Creatinine 1.31, BUN 25, Potassium 4.9, Sodium 137, GFR 48 A complete set of results can be found in Results Review.   Recommendations: Provided EP scheduler number and to call for in office visit to check device.  Advised Dr Kennyth will be her physician since Dr Fernande has retired.   No changes and encouraged to call if experiencing any fluid symptoms.   Follow-up plan: ICM  clinic phone appointment on 05/04/2024.   91 day device clinic remote transmission 04/22/2024.              EP/Cardiology next office visit: 04/02/2024 with Dr Cherrie.   Recall 03/15/2024 with Dr Fernande.          Copy of ICM check sent to Dr. Kennyth.    Remote Monitoring Medically Necessary for Heart Failure Management.  3 Month HeartLogic Heart Failure Index:    8 Day Data Trend:          [name], RN 04/01/2024 3:19 PM

## 2024-04-02 ENCOUNTER — Encounter (HOSPITAL_COMMUNITY): Payer: Self-pay | Admitting: Internal Medicine

## 2024-04-02 ENCOUNTER — Ambulatory Visit (HOSPITAL_COMMUNITY)
Admission: RE | Admit: 2024-04-02 | Discharge: 2024-04-02 | Disposition: A | Discharge status: Home / Self Care | Source: Ambulatory Visit | Attending: Internal Medicine | Admitting: Internal Medicine

## 2024-04-02 VITALS — BP 108/62 | HR 69 | Ht 70.0 in | Wt 177.2 lb

## 2024-04-02 DIAGNOSIS — Z59868 Other specified financial insecurity: Secondary | ICD-10-CM | POA: Diagnosis not present

## 2024-04-02 DIAGNOSIS — I252 Old myocardial infarction: Secondary | ICD-10-CM | POA: Insufficient documentation

## 2024-04-02 DIAGNOSIS — F32A Depression, unspecified: Secondary | ICD-10-CM | POA: Insufficient documentation

## 2024-04-02 DIAGNOSIS — I714 Abdominal aortic aneurysm, without rupture, unspecified: Secondary | ICD-10-CM | POA: Insufficient documentation

## 2024-04-02 DIAGNOSIS — I513 Intracardiac thrombosis, not elsewhere classified: Secondary | ICD-10-CM | POA: Diagnosis not present

## 2024-04-02 DIAGNOSIS — I5042 Chronic combined systolic (congestive) and diastolic (congestive) heart failure: Secondary | ICD-10-CM

## 2024-04-02 DIAGNOSIS — Z8659 Personal history of other mental and behavioral disorders: Secondary | ICD-10-CM | POA: Diagnosis not present

## 2024-04-02 DIAGNOSIS — R0789 Other chest pain: Secondary | ICD-10-CM

## 2024-04-02 DIAGNOSIS — Z87891 Personal history of nicotine dependence: Secondary | ICD-10-CM | POA: Diagnosis not present

## 2024-04-02 DIAGNOSIS — I2510 Atherosclerotic heart disease of native coronary artery without angina pectoris: Secondary | ICD-10-CM | POA: Diagnosis not present

## 2024-04-02 DIAGNOSIS — Z4502 Encounter for adjustment and management of automatic implantable cardiac defibrillator: Secondary | ICD-10-CM | POA: Insufficient documentation

## 2024-04-02 DIAGNOSIS — F419 Anxiety disorder, unspecified: Secondary | ICD-10-CM | POA: Insufficient documentation

## 2024-04-02 DIAGNOSIS — Z7901 Long term (current) use of anticoagulants: Secondary | ICD-10-CM | POA: Insufficient documentation

## 2024-04-02 DIAGNOSIS — I2582 Chronic total occlusion of coronary artery: Secondary | ICD-10-CM | POA: Diagnosis not present

## 2024-04-02 DIAGNOSIS — G4733 Obstructive sleep apnea (adult) (pediatric): Secondary | ICD-10-CM | POA: Diagnosis not present

## 2024-04-02 DIAGNOSIS — D751 Secondary polycythemia: Secondary | ICD-10-CM | POA: Diagnosis not present

## 2024-04-02 DIAGNOSIS — Z79899 Other long term (current) drug therapy: Secondary | ICD-10-CM | POA: Insufficient documentation

## 2024-04-02 DIAGNOSIS — I5022 Chronic systolic (congestive) heart failure: Secondary | ICD-10-CM | POA: Insufficient documentation

## 2024-04-02 LAB — CBC
HCT: 43.4 % (ref 36.0–46.0)
Hemoglobin: 14.1 g/dL (ref 12.0–15.0)
MCH: 28.8 pg (ref 26.0–34.0)
MCHC: 32.5 g/dL (ref 30.0–36.0)
MCV: 88.6 fL (ref 80.0–100.0)
Platelets: 310 K/uL (ref 150–400)
RBC: 4.9 MIL/uL (ref 3.87–5.11)
RDW: 14.6 % (ref 11.5–15.5)
WBC: 7.7 K/uL (ref 4.0–10.5)
nRBC: 0 % (ref 0.0–0.2)

## 2024-04-02 LAB — BASIC METABOLIC PANEL WITH GFR
Anion gap: 8 (ref 5–15)
BUN: 11 mg/dL (ref 6–20)
CO2: 25 mmol/L (ref 22–32)
Calcium: 9.4 mg/dL (ref 8.9–10.3)
Chloride: 107 mmol/L (ref 98–111)
Creatinine, Ser: 0.94 mg/dL (ref 0.44–1.00)
GFR, Estimated: >60 mL/min (ref >60)
Glucose, Bld: 103 mg/dL — ABNORMAL HIGH (ref 70–99)
Potassium: 4.5 mmol/L (ref 3.5–5.1)
Sodium: 139 mmol/L (ref 135–145)

## 2024-04-02 LAB — PRO BRAIN NATRIURETIC PEPTIDE: Pro Brain Natriuretic Peptide: 135 pg/mL (ref <300.0)

## 2024-04-02 NOTE — Progress Notes (Signed)
 ADVANCED HF CLINIC NOTE   PCP: Vicci Barnie NOVAK, MD Cardiology: Dr. Raford HF Cardiologist: Dr. Cherrie  Chief Complaint: Shortness of breath  HPI: Christine [name] is a 58 y.o. female with history of chronic tobacco use, ADHD, anxiety, depression, CAD, systolic HF   Presented to ED 89/2/77 with increased shortness of breath/tachycardia. Adderrall stopped. Echo with EF 20-25%,  LHC/RHC with single vessel CAD (occluded mLAD) elevated filling pressures and  moderately reduced CO. She did not undergo PCI as cMRI demonstrated nonviable myocardium in the infarct territory. CAD managed medically and she was placed on HF GDMT. ? blocker discontinued w/ reduced output.     EF did not improve. Repeat echo 05/11/21 EF < 20% LV severely dilated RV ok Mild MR. Referred for ICD.   CPX 05/2021 with very mild HF limitation with elevated Ve/VCO2 slope  S/p [employer] ICD 4/23.  Echo (4/24) showed improved EF 35-40%, garde 1 DD, RV ok  RHC on 07/12/23 that was c/w volume depletion w/ low CO. This improved w/ volume resuscitation in the cath lab (Initial hemodynamics RA 5, PA 20/11, PCW 8, FICK CI 1.96, TD CI 1.98, PAPi 1.8, Repeat hemodynamics after 300 cc fluid bolus PA 28/11, PW 10, FICK CI 2.1, TD CI 2.5). It was suspected she was keeping herself too dry w/ diuretics. She was instructed to reduce her lasix  in half but never did and continued to take lasix  80 mg bid + twice weekly metolazone .    Admitted 08/24/23 with shock in the setting of volume depletion. Hydrated w/ IVFs and diuretics and BP active meds held. Weaned off NE. She was restarted on corlanor  and low dose spironolactone . She was instructed to resume lasix  on 5/13 but at reduced dose of 40 mg bid. KCl regimen was also reduced. Metolazone  was discontinued. Farxiga  and Toprol  XL remained on hold at d/c.   CPX 7/25: spirometry normal pVO2 16.4 (73%) adjusted to ibw pVO2 18.5 Slope 33 pRER 1.15   Today she returns for HF follow up. Says  she has been feeling fatigued for past week. Had some diarrhea over the weekend and feels she is dry. Starting to feel better now. Takes lasix  40 bid. Joined a gym. Works out on recumbent climber for 20-30 mins + exercise bike. 3-4x/week   ICD interrogated personally. HL score 2 volume ok AL 2hr/day no VT/AF Personally reviewed   ROS: All systems negative except as listed in HPI, PMH and Problem List.  SH:  Social History   Socioeconomic History   Marital status: Single    Spouse name: Not on file   Number of children: 0   Years of education: 16   Highest education level: Bachelor's degree (e.g., BA, AB, BS)  Occupational History   Not on file  Tobacco Use   Smoking status: Former    Current packs/day: 0.00    Average packs/day: 0.5 packs/day    Types: Cigarettes    Quit date: 01/15/2021    Years since quitting: 3.2    Passive exposure: Past   Smokeless tobacco: Never  Vaping Use   Vaping status: Never Used  Substance and Sexual Activity   Alcohol use: Not Currently   Drug use: Never   Sexual activity: Not on file  Other Topics Concern   Not on file  Social History Narrative   Not on file   Social Drivers of Health   Tobacco Use: Medium Risk (04/02/2024)   Patient History    Smoking Tobacco Use:  Former    Smokeless Tobacco Use: Never    Passive Exposure: Past  Physicist, Medical Strain: Medium Risk (02/17/2024)   Overall Financial Resource Strain (CARDIA)    Difficulty of Paying Living Expenses: Somewhat hard  Food Insecurity: No Food Insecurity (02/17/2024)   Epic    Worried About Programme Researcher, Broadcasting/film/video in the Last Year: Never true    Ran Out of Food in the Last Year: Never true  Transportation Needs: No Transportation Needs (02/17/2024)   Epic    Lack of Transportation (Medical): No    Lack of Transportation (Non-Medical): No  Physical Activity: Insufficiently Active (02/17/2024)   Exercise Vital Sign    Days of Exercise per Week: 4 days    Minutes of Exercise per  Session: 30 min  Stress: Stress Concern Present (02/17/2024)   [employer] of Occupational Health - Occupational Stress Questionnaire    Feeling of Stress: Rather much  Social Connections: Moderately Isolated (02/17/2024)   Social Connection and Isolation Panel    Frequency of Communication with Friends and Family: More than three times a week    Frequency of Social Gatherings with Friends and Family: Twice a week    Attends Religious Services: 1 to 4 times per year    Active Member of [employer] or Organizations: No    Attends Engineer, Structural: Not on file    Marital Status: Never married  Intimate Partner Violence: Not At Risk (09/03/2023)   Humiliation, Afraid, Rape, and Kick questionnaire    Fear of Current or Ex-Partner: No    Emotionally Abused: No    Physically Abused: No    Sexually Abused: No  Depression (PHQ2-9): High Risk (02/24/2024)   Depression (PHQ2-9)    PHQ-2 Score: 19  Alcohol Screen: Low Risk (10/20/2023)   Alcohol Screen    Last Alcohol Screening Score (AUDIT): 1  Housing: Low Risk (02/17/2024)   Epic    Unable to Pay for Housing in the Last Year: No    Number of Times Moved in the Last Year: 0    Homeless in the Last Year: No  Utilities: Not At Risk (09/03/2023)   AHC Utilities    Threatened with loss of utilities: No  Health Literacy: Adequate Health Literacy (09/03/2023)   B1300 Health Literacy    Frequency of need for help with medical instructions: Never   FH:  Family History  Problem Relation Age of Onset   Eczema Mother    Stroke Mother    Atrial fibrillation Mother    Heart failure Father    Asthma Brother    Stomach cancer Maternal Grandfather    Liver cancer Neg Hx    Esophageal cancer Neg Hx    Colon polyps Neg Hx    Colon cancer Neg Hx    Allergic rhinitis Neg Hx    Angioedema Neg Hx    Urticaria Neg Hx    Past Medical History:  Diagnosis Date   Allergy     Anxiety 01/20/2021   CAD in native artery 08/24/2021   CHF  (congestive heart failure) (HCC)    Chronic combined systolic and diastolic heart failure (HCC) 08/24/2021   Complication of anesthesia    Crohn's colitis (HCC)    Depression    Hyperlipidemia    Myocardial infarction [facility])    Presence of cardiac defibrillator 07/2021   Shortness of breath 01/20/2021   Sleep apnea 07/2021   Tobacco abuse 01/20/2021   Urticaria    Current Outpatient Medications  Medication Sig Dispense Refill   Accu-Chek Softclix Lancets lancets Use as instructed.  Check blood sugar daily before breakfast. 100 each 12   acetaminophen  (TYLENOL ) 500 MG tablet Take 1,000 mg by mouth 3 (three) times daily as needed for moderate pain or headache.     albuterol  (VENTOLIN  HFA) 108 (90 Base) MCG/ACT inhaler Inhale 2 puffs into the lungs every 6 (six) hours as needed for wheezing or shortness of breath. 18 g 1   ALPRAZolam  (XANAX ) 1 MG tablet Take 2 mg by mouth 3 (three) times daily.     alum & mag hydroxide-simeth (MAALOX/MYLANTA) 200-200-20 MG/5ML suspension Take 30 mLs by mouth every 4 (four) hours as needed for indigestion or heartburn. 355 mL 0   apixaban  (ELIQUIS ) 5 MG TABS tablet Take 1 tablet (5 mg total) by mouth 2 (two) times daily. 60 tablet 11   atorvastatin  (LIPITOR ) 80 MG tablet Take 1 tablet (80 mg total) by mouth daily. 90 tablet 3   Blood Glucose Monitoring Suppl (ACCU-CHEK GUIDE) w/Device KIT Check blood sugar once daily before breakfast 1 kit 0   buPROPion  (WELLBUTRIN  XL) 300 MG 24 hr tablet Take 300 mg by mouth every morning.     cetirizine  (ZYRTEC  ALLERGY ) 10 MG tablet Take 1 tablet (10 mg total) by mouth 2 (two) times daily. 60 tablet 5   cyclobenzaprine  (FLEXERIL ) 10 MG tablet Take 10 mg by mouth 3 (three) times daily as needed for muscle spasms.     dapagliflozin  propanediol (FARXIGA ) 10 MG TABS tablet Take 1 tablet (10 mg total) by mouth daily before breakfast. 90 tablet 3   dicyclomine  (BENTYL ) 10 MG capsule Take 1 capsule (10 mg total) by mouth every 6  (six) hours as needed for spasms. 60 capsule 3   diphenoxylate -atropine  (LOMOTIL ) 2.5-0.025 MG tablet Take 2 tablets by mouth 4 (four) times daily as needed for diarrhea/ loose stools. 60 tablet 3   Evolocumab  (REPATHA  SURECLICK) 140 MG/ML SOAJ Inject 140 mg into the skin every 14 (fourteen) days. 6 mL 3   famotidine  (PEPCID ) 20 MG tablet Take 1 tablet (20 mg total) by mouth 2 (two) times daily. 60 tablet 5   feeding supplement (ENSURE ENLIVE / ENSURE PLUS) LIQD Take 237 mLs by mouth 2 (two) times daily between meals. 237 mL 12   Fezolinetant  (VEOZAH ) 45 MG TABS Take 1 tablet (45 mg total) by mouth daily. 30 tablet 6   fluticasone  (FLONASE ) 50 MCG/ACT nasal spray Place 1-2 sprays into both nostrils daily as needed (nasal congestion). 16 g 5   furosemide  (LASIX ) 20 MG tablet Take 2 tablets (40 mg total) by mouth 2 (two) times daily. 120 tablet 3   glucose blood test strip Use as instructed to check blood sugar daily before breakfast 100 each 6   ivabradine  (CORLANOR ) 5 MG TABS tablet Take 1/2 tablet (2.5 mg total) by mouth 2 (two) times daily. 30 tablet 5   lipase/protease/amylase (CREON ) 36000 UNITS CPEP capsule Take 1 capsule (36,000 Units total) by mouth in the morning, at noon, and at bedtime. 300 capsule 0   metroNIDAZOLE  (METROGEL ) 1 % gel Apply 1 Application topically daily as needed (rash).     Multiple Vitamins-Minerals (MULTIVITAMIN WITH MINERALS) tablet Take 1 tablet by mouth daily.     oxymetazoline (AFRIN) 0.05 % nasal spray Place 1-2 sprays into both nostrils 2 (two) times daily as needed for congestion.     potassium chloride  SA (KLOR-CON  M) 20 MEQ tablet Take 4 tablets (80 mEq total) by  mouth 2 (two) times daily. 240 tablet 3   promethazine  (PHENERGAN ) 25 MG tablet Take 1 tablet (25 mg total) by mouth every 6 (six) to 8 (eight) hours for nausea or vomiting. 90 tablet 2   sitaGLIPtin  (JANUVIA ) 50 MG tablet Take 1 tablet (50 mg total) by mouth daily. 90 tablet 1   triamcinolone  cream  (KENALOG ) 0.1 % Apply 1 Application topically daily as needed (irritation).     valACYclovir  (VALTREX ) 500 MG tablet Take 1 tablet (500 mg total) by mouth 2 (two) times daily. 6 tablet 2   vedolizumab  (ENTYVIO ) 300 MG injection Inject 300 mg into the vein every 2 (two) months.     zolpidem  (AMBIEN ) 10 MG tablet Take 10 mg by mouth at bedtime as needed.     No current facility-administered medications for this encounter.   BP 108/62   Pulse 69   Ht 5' 10 (1.778 m)   Wt 80.4 kg (177 lb 3.2 oz)   SpO2 96%   BMI 25.43 kg/m   Wt Readings from Last 3 Encounters:  04/02/24 80.4 kg (177 lb 3.2 oz)  03/25/24 79.7 kg (175 lb 12.8 oz)  02/24/24 79.4 kg (175 lb)   PHYSICAL EXAM: General:  Sitting up in bed. No resp difficulty HEENT: normal Neck: supple. no JVD.  Cor: Regular rate & rhythm. No rubs, gallops or murmurs. Lungs: clear Abdomen: soft, nontender, nondistended.Good bowel sounds. Extremities: no cyanosis, clubbing, rash, edema Neuro: alert & orientedx3, cranial nerves grossly intact. moves all 4 extremities w/o difficulty. Affect pleasant    ASSESSMENT & PLAN:  1. 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 Echo EF 20-25%, severely dilated LV, RV okay, moderate MR - R/LHC (10/22): 1v CAD occluded mid LAD PCWP 27,  Fick 4.7/2.  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 No viability. RV okay. Not sure how to explain inferior defects on cMRI  - Echo (05/11/21) EF < 20% RV ok - CPX 1/23 with very mild HF limitation:  Peak VO2: 18.8 (90% predicted peak VO2) VE/VCO2 slope: 33 Peak RER: 1.14  - s/p [employer] ICD 4/23. - Echo (4/24): EF 35-40%, RV ok - Echo (3/25): EF 30-35%, RV low normal - CPX 7/25: spirometry normal pVO2 16.4 (73%) adjusted to ibw pVO2 18.5 Slope 33 pRER 1.15  - Stable NYHA II-early III - Volume ok - maybe a bit dry - Hold off bb due to severe fatigue -  Continue Ivabradine  2.5 mg bid.  - Continue spiro 12.5 mg daily  - Continue Farxiga   10 mg daily  - Continue Lasix  40 mg bid  - Labs today - Can consider Barostim  2. CAD - Single vessel LAD occlusion on LHC. - No s/s angina - Continue atorvastatin  80 mg daily.  - No ASA with Eliquis  use   3. H/O LV apical thrombus -  Continue Eliquis .No bleeding - Check CBC  4. OSA - Mild on sleep study, AHI 11.8 - On CPAP  5. Polycythemia - Follows with Heme/Onc. - JAK negative - check CBC today  9. AAA - CT scan 11/23  3.1cm - Follow u/s in 3 years recommended (02/2025)   Toribio Fuel, MD  12:00 PM

## 2024-04-02 NOTE — Patient Instructions (Signed)
 Medication Changes:  No Changes In Medications at this time.   Lab Work:  Labs done today, your results will be available in MyChart, we will contact you for abnormal readings.  Follow-Up in: 6 months with an Echo PLEASE CALL OUR OFFICE AROUND APRIL/MAY TO GET SCHEDULED FOR YOUR APPOINTMENT. PHONE NUMBER IS [phone] OPTION 2   At the [facility], you and your health needs are our priority. We have a designated team specialized in the treatment of Heart Failure. This Care Team includes your primary Heart Failure Specialized Cardiologist (physician), Advanced Practice Providers (APPs- Physician Assistants and Nurse Practitioners), and Pharmacist who all work together to provide you with the care you need, when you need it.   You may see any of the following providers on your designated Care Team at your next follow up:  Dr. Toribio Fuel Dr. Ezra Shuck Dr. Odis Brownie Greig Mosses, NP Caffie Shed, [place] [facility] [place], [place] Beckey Coe, NP Jordan Lee, NP Tinnie Redman, PharmD   Please be sure to bring in all your medications bottles to every appointment.   Need to Contact Us :  If you have any questions or concerns before your next appointment please send us  a message through [place] or call our office at [phone].    TO LEAVE A MESSAGE FOR THE NURSE SELECT OPTION 2, PLEASE LEAVE A MESSAGE INCLUDING: YOUR NAME DATE OF BIRTH CALL BACK NUMBER REASON FOR CALL**this is important as we prioritize the call backs  YOU WILL RECEIVE A CALL BACK THE SAME DAY AS LONG AS YOU CALL BEFORE 4:00 PM

## 2024-04-08 ENCOUNTER — Other Ambulatory Visit (HOSPITAL_COMMUNITY): Payer: Self-pay

## 2024-04-16 ENCOUNTER — Encounter (HOSPITAL_COMMUNITY): Payer: Self-pay | Admitting: Gastroenterology

## 2024-04-17 ENCOUNTER — Encounter (HOSPITAL_COMMUNITY): Payer: Self-pay | Admitting: Gastroenterology

## 2024-04-20 ENCOUNTER — Ambulatory Visit: Admitting: "Endocrinology

## 2024-04-21 ENCOUNTER — Other Ambulatory Visit (HOSPITAL_COMMUNITY): Payer: Self-pay

## 2024-04-21 ENCOUNTER — Other Ambulatory Visit: Payer: Self-pay

## 2024-04-22 ENCOUNTER — Ambulatory Visit

## 2024-04-22 ENCOUNTER — Other Ambulatory Visit: Payer: Self-pay

## 2024-04-22 ENCOUNTER — Telehealth (HOSPITAL_COMMUNITY): Payer: Self-pay

## 2024-04-22 ENCOUNTER — Other Ambulatory Visit (HOSPITAL_COMMUNITY): Payer: Self-pay

## 2024-04-22 DIAGNOSIS — I255 Ischemic cardiomyopathy: Secondary | ICD-10-CM | POA: Diagnosis not present

## 2024-04-22 NOTE — Telephone Encounter (Signed)
 Auth Submission: NO AUTH NEEDED Site of care: Site of care: [facility] Payer: [employer] Medication & CPT/J Code(s) submitted: Entyvio  (Vedolizumab ) J3380 Diagnosis Code: K50.00 Route of submission (phone, fax, portal):  Phone # Fax # Auth type: Buy/Bill HB Units/visits requested: 300mg  q8weeks Reference number:  Approval from: 04/16/24 to 03/15/25

## 2024-04-23 LAB — CUP PACEART REMOTE DEVICE CHECK
Battery Remaining Longevity: 156 mo
Battery Remaining Percentage: 100 %
Brady Statistic RV Percent Paced: 0 %
Date Time Interrogation Session: 20260107025200
HighPow Impedance: 82 Ohm
Implantable Lead Connection Status: 753985
Implantable Lead Implant Date: 20230407
Implantable Lead Location: 753860
Implantable Lead Model: 138
Implantable Lead Serial Number: 303166
Implantable Pulse Generator Implant Date: 20230407
Lead Channel Impedance Value: 887 Ohm
Lead Channel Pacing Threshold Amplitude: 0.8 V
Lead Channel Pacing Threshold Pulse Width: 0.4 ms
Lead Channel Setting Pacing Amplitude: 2.5 V
Lead Channel Setting Pacing Pulse Width: 0.4 ms
Lead Channel Setting Sensing Sensitivity: 0.5 mV
Pulse Gen Serial Number: 216478
Zone Setting Status: 755011

## 2024-04-25 ENCOUNTER — Other Ambulatory Visit (HOSPITAL_COMMUNITY): Payer: Self-pay

## 2024-04-25 ENCOUNTER — Ambulatory Visit: Payer: Self-pay | Admitting: Cardiology

## 2024-04-27 NOTE — Progress Notes (Signed)
 Remote ICD Transmission

## 2024-04-30 ENCOUNTER — Other Ambulatory Visit (HOSPITAL_COMMUNITY): Payer: Self-pay

## 2024-04-30 ENCOUNTER — Ambulatory Visit: Payer: Self-pay | Admitting: Internal Medicine

## 2024-05-01 ENCOUNTER — Other Ambulatory Visit (HOSPITAL_COMMUNITY): Payer: Self-pay

## 2024-05-01 ENCOUNTER — Encounter: Payer: Self-pay | Admitting: Internal Medicine

## 2024-05-04 ENCOUNTER — Ambulatory Visit

## 2024-05-04 DIAGNOSIS — Z95810 Presence of automatic (implantable) cardiac defibrillator: Secondary | ICD-10-CM

## 2024-05-04 DIAGNOSIS — I5042 Chronic combined systolic (congestive) and diastolic (congestive) heart failure: Secondary | ICD-10-CM

## 2024-05-06 ENCOUNTER — Other Ambulatory Visit (HOSPITAL_COMMUNITY): Payer: Self-pay

## 2024-05-07 ENCOUNTER — Telehealth: Payer: Self-pay

## 2024-05-07 NOTE — Telephone Encounter (Signed)
 Remote ICM transmission received.  Attempted call to patient regarding ICM remote transmission and no answer.

## 2024-05-07 NOTE — Progress Notes (Signed)
 EPIC Encounter for ICM Monitoring  Patient Name: Christine [name] is a 59 y.o. female Date: 05/07/2024 Primary Care Physican: Vicci Barnie NOVAK, MD Primary Cardiologist: [place]/Bensimhon Electrophysiologist: Kennyth 05/27/2023 Weight: 182 - 183 lbs (baseline)          06/24/2023 Weight: 181 lbs 06/26/2023 Weight: 188 lbs          08/02/2023 Weight: 184-185 lbs  09/13/2023 Weight: 184 lbs   (6 lbs less than hospitalization)     10/24/2023 Office Weight: 183 lbs 12/06/2023 Weight: 182 lbs   01/16/2024 Weight: 176-178 lbs     04/02/2024 Office Weight: 177 lbs                      Attempted call to patient and unable to reach.  Transmission results reviewed.   HeartLogic Heart Failure Index 0 suggesting fluid levels are within normal threshold range.    Prescribed:  Furosemide  20 mg take 2 tablet(s) (40 mg total) by mouth twice a day Potassium 20 mEq take 4 tablet(s) (80 mEq total) by mouth two (2) times a day Spironolactone  25 mg take 1 tablet by mouth daily.    Labs: 12/15/2023 Creatinine 0.96, BUN 13, Potassium 3.7, Sodium 142, >60 11/06/2023 Creatinine 1.06, BUN 13, Potassium 3.9, Sodium 138, GFR >60 09/03/2023 Creatinine 1.03, BUN 14, Potassium 4.1, Sodium 140  08/26/2023 Creatinine 1.08, BUN 15, Potassium 3.1, Sodium 136, GFR >60  08/25/2023 Creatinine 1.32, BUN 17, Potassium 4.9, Sodium 135, GFR 48  08/24/2023 Creatinine 1.31, BUN 25, Potassium 4.9, Sodium 137, GFR 48 A complete set of results can be found in Results Review.   Recommendations:  Unable to reach.        Follow-up plan: ICM clinic phone appointment on 06/08/2024.   91 day device clinic remote transmission 07/22/2024.              EP/Cardiology next office visit:   Recall 03/15/2024 with Dr Fernande.          Copy of ICM check sent to Dr. Kennyth.    Remote Monitoring Medically Necessary for Heart Failure Management.  3 Month HeartLogic Heart Failure Index:    8 Day Data Trend:          [name],  RN 05/07/2024 7:49 AM

## 2024-05-08 ENCOUNTER — Other Ambulatory Visit (HOSPITAL_COMMUNITY): Payer: Self-pay | Admitting: Cardiology

## 2024-05-08 ENCOUNTER — Other Ambulatory Visit: Payer: Self-pay | Admitting: Gastroenterology

## 2024-05-08 ENCOUNTER — Other Ambulatory Visit (HOSPITAL_COMMUNITY): Payer: Self-pay

## 2024-05-08 MED ORDER — PROMETHAZINE HCL 25 MG PO TABS
25.0000 mg | ORAL_TABLET | Freq: Four times a day (QID) | ORAL | 2 refills | Status: AC
  Filled 2024-05-08: qty 90, 23d supply, fill #0
  Filled 2024-05-20: qty 90, 23d supply, fill #1

## 2024-05-08 MED ORDER — POTASSIUM CHLORIDE CRYS ER 20 MEQ PO TBCR
80.0000 meq | EXTENDED_RELEASE_TABLET | Freq: Two times a day (BID) | ORAL | 3 refills | Status: AC
  Filled 2024-05-08: qty 240, 30d supply, fill #0

## 2024-05-12 ENCOUNTER — Ambulatory Visit: Admitting: "Endocrinology

## 2024-05-13 ENCOUNTER — Other Ambulatory Visit: Payer: Self-pay | Admitting: Internal Medicine

## 2024-05-13 ENCOUNTER — Other Ambulatory Visit: Payer: Self-pay

## 2024-05-13 ENCOUNTER — Other Ambulatory Visit (HOSPITAL_COMMUNITY): Payer: Self-pay

## 2024-05-13 DIAGNOSIS — E119 Type 2 diabetes mellitus without complications: Secondary | ICD-10-CM

## 2024-05-13 MED ORDER — SITAGLIPTIN PHOSPHATE 50 MG PO TABS
50.0000 mg | ORAL_TABLET | Freq: Every day | ORAL | 1 refills | Status: AC
  Filled 2024-05-13: qty 90, 90d supply, fill #0

## 2024-05-14 NOTE — Progress Notes (Signed)
 31 day ICM Remote transmission canceled due to [facility] clinic is on hold until further notice.  91 day remote monitoring will continue per protocol.

## 2024-05-20 ENCOUNTER — Encounter (HOSPITAL_COMMUNITY): Payer: Self-pay | Admitting: Gastroenterology

## 2024-05-20 ENCOUNTER — Ambulatory Visit: Admitting: "Endocrinology

## 2024-05-20 ENCOUNTER — Other Ambulatory Visit (HOSPITAL_COMMUNITY): Payer: Self-pay

## 2024-05-21 ENCOUNTER — Inpatient Hospital Stay (HOSPITAL_COMMUNITY)
Admission: RE | Admit: 2024-05-21 | Discharge: 2024-05-21 | Disposition: A | Source: Ambulatory Visit | Attending: Gastroenterology

## 2024-05-21 VITALS — BP 88/62 | HR 65 | Temp 98.3°F | Resp 17

## 2024-05-21 DIAGNOSIS — K5000 Crohn's disease of small intestine without complications: Secondary | ICD-10-CM

## 2024-05-21 MED ORDER — VEDOLIZUMAB 300 MG IV SOLR
300.0000 mg | Freq: Once | INTRAVENOUS | Status: AC
  Administered 2024-05-21: 300 mg via INTRAVENOUS
  Filled 2024-05-21: qty 5

## 2024-05-21 MED ORDER — SODIUM CHLORIDE 0.9 % IV SOLN: INTRAVENOUS | Status: DC

## 2024-05-22 ENCOUNTER — Telehealth (HOSPITAL_COMMUNITY): Payer: Self-pay

## 2024-05-22 NOTE — Telephone Encounter (Signed)
 Patient notified. Pt will integrate device now.

## 2024-05-22 NOTE — Telephone Encounter (Signed)
 Patient reports significant dizziness with near-syncope for the past 3-4 days. Dizziness is worse at night. Patient reports low blood pressure and is afraid to drive.  BP Today: 88/53

## 2024-06-08 ENCOUNTER — Ambulatory Visit

## 2024-06-22 ENCOUNTER — Ambulatory Visit: Admitting: Allergy

## 2024-06-23 ENCOUNTER — Ambulatory Visit: Admitting: Internal Medicine

## 2024-07-16 ENCOUNTER — Encounter (HOSPITAL_COMMUNITY)

## 2024-07-22 ENCOUNTER — Encounter

## 2024-10-21 ENCOUNTER — Encounter

## 2025-01-06 ENCOUNTER — Ambulatory Visit: Admitting: Physician Assistant

## 2025-01-20 ENCOUNTER — Encounter

## 2025-04-21 ENCOUNTER — Encounter

## 2025-07-21 ENCOUNTER — Encounter
